# Patient Record
Sex: Female | Born: 1945
Health system: Southern US, Community
[De-identification: ages and names within clinical notes are randomized; demographics above are authoritative.]

## PROBLEM LIST (undated history)

## (undated) DIAGNOSIS — T4145XA Adverse effect of unspecified anesthetic, initial encounter: Secondary | ICD-10-CM

## (undated) DIAGNOSIS — J441 Chronic obstructive pulmonary disease with (acute) exacerbation: Secondary | ICD-10-CM

## (undated) DIAGNOSIS — H9319 Tinnitus, unspecified ear: Secondary | ICD-10-CM

## (undated) DIAGNOSIS — M949 Disorder of cartilage, unspecified: Secondary | ICD-10-CM

## (undated) DIAGNOSIS — J309 Allergic rhinitis, unspecified: Secondary | ICD-10-CM

## (undated) DIAGNOSIS — M899 Disorder of bone, unspecified: Secondary | ICD-10-CM

## (undated) DIAGNOSIS — N959 Unspecified menopausal and perimenopausal disorder: Secondary | ICD-10-CM

## (undated) DIAGNOSIS — J45909 Unspecified asthma, uncomplicated: Secondary | ICD-10-CM

## (undated) DIAGNOSIS — M549 Dorsalgia, unspecified: Secondary | ICD-10-CM

## (undated) DIAGNOSIS — M810 Age-related osteoporosis without current pathological fracture: Secondary | ICD-10-CM

## (undated) DIAGNOSIS — R062 Wheezing: Secondary | ICD-10-CM

## (undated) DIAGNOSIS — F411 Generalized anxiety disorder: Secondary | ICD-10-CM

## (undated) DIAGNOSIS — J449 Chronic obstructive pulmonary disease, unspecified: Secondary | ICD-10-CM

## (undated) DIAGNOSIS — M1990 Unspecified osteoarthritis, unspecified site: Secondary | ICD-10-CM

## (undated) DIAGNOSIS — M75100 Unspecified rotator cuff tear or rupture of unspecified shoulder, not specified as traumatic: Secondary | ICD-10-CM

## (undated) DIAGNOSIS — J069 Acute upper respiratory infection, unspecified: Secondary | ICD-10-CM

## (undated) DIAGNOSIS — H40219 Acute angle-closure glaucoma, unspecified eye: Secondary | ICD-10-CM

## (undated) DIAGNOSIS — R490 Dysphonia: Secondary | ICD-10-CM

## (undated) DIAGNOSIS — E785 Hyperlipidemia, unspecified: Secondary | ICD-10-CM

## (undated) DIAGNOSIS — R259 Unspecified abnormal involuntary movements: Secondary | ICD-10-CM

## (undated) DIAGNOSIS — R251 Tremor, unspecified: Secondary | ICD-10-CM

## (undated) DIAGNOSIS — F329 Major depressive disorder, single episode, unspecified: Secondary | ICD-10-CM

## (undated) DIAGNOSIS — T8859XA Other complications of anesthesia, initial encounter: Secondary | ICD-10-CM

## (undated) HISTORY — DX: Hyperlipidemia, unspecified: E78.5

## (undated) HISTORY — DX: Disorder of bone, unspecified: M89.9

## (undated) HISTORY — DX: Acute upper respiratory infection, unspecified: J06.9

## (undated) HISTORY — DX: Major depressive disorder, single episode, unspecified: F32.9

## (undated) HISTORY — DX: Unspecified rotator cuff tear or rupture of unspecified shoulder, not specified as traumatic: M75.100

## (undated) HISTORY — DX: Dysphonia: R49.0

## (undated) HISTORY — DX: Unspecified asthma, uncomplicated: J45.909

## (undated) HISTORY — DX: Unspecified abnormal involuntary movements: R25.9

## (undated) HISTORY — DX: Allergic rhinitis, unspecified: J30.9

## (undated) HISTORY — DX: Wheezing: R06.2

## (undated) HISTORY — DX: Disorder of cartilage, unspecified: M94.9

## (undated) HISTORY — DX: Dorsalgia, unspecified: M54.9

## (undated) HISTORY — DX: Tremor, unspecified: R25.1

## (undated) HISTORY — PX: ABDOMINAL HYSTERECTOMY: SHX81

## (undated) HISTORY — DX: Chronic obstructive pulmonary disease, unspecified: J44.9

## (undated) HISTORY — DX: Tinnitus, unspecified ear: H93.19

## (undated) HISTORY — DX: Generalized anxiety disorder: F41.1

## (undated) HISTORY — DX: Unspecified menopausal and perimenopausal disorder: N95.9

## (undated) HISTORY — DX: Acute angle-closure glaucoma, unspecified eye: H40.219

## (undated) HISTORY — PX: OTHER SURGICAL HISTORY: SHX169

## (undated) HISTORY — DX: Chronic obstructive pulmonary disease with (acute) exacerbation: J44.1

## (undated) HISTORY — DX: Age-related osteoporosis without current pathological fracture: M81.0

---

## 1999-07-29 HISTORY — PX: OTHER SURGICAL HISTORY: SHX169

## 1999-12-02 ENCOUNTER — Ambulatory Visit (HOSPITAL_COMMUNITY): Admission: RE | Admit: 1999-12-02 | Discharge: 1999-12-02 | Payer: Self-pay | Admitting: Cardiovascular Disease

## 1999-12-17 ENCOUNTER — Ambulatory Visit (HOSPITAL_COMMUNITY): Admission: RE | Admit: 1999-12-17 | Discharge: 1999-12-17 | Payer: Self-pay | Admitting: Cardiovascular Disease

## 2009-06-27 ENCOUNTER — Ambulatory Visit: Payer: Self-pay | Admitting: Internal Medicine

## 2009-06-27 DIAGNOSIS — J4489 Other specified chronic obstructive pulmonary disease: Secondary | ICD-10-CM

## 2009-06-27 DIAGNOSIS — R062 Wheezing: Secondary | ICD-10-CM

## 2009-06-27 DIAGNOSIS — J449 Chronic obstructive pulmonary disease, unspecified: Secondary | ICD-10-CM

## 2009-06-27 HISTORY — DX: Wheezing: R06.2

## 2009-06-27 HISTORY — DX: Other specified chronic obstructive pulmonary disease: J44.89

## 2009-06-27 HISTORY — DX: Chronic obstructive pulmonary disease, unspecified: J44.9

## 2009-07-03 ENCOUNTER — Telehealth: Payer: Self-pay | Admitting: Internal Medicine

## 2009-07-30 ENCOUNTER — Ambulatory Visit: Payer: Self-pay | Admitting: Internal Medicine

## 2009-07-31 LAB — CONVERTED CEMR LAB
Albumin: 3.6 g/dL (ref 3.5–5.2)
Bilirubin Urine: NEGATIVE
Calcium: 9.1 mg/dL (ref 8.4–10.5)
Chloride: 104 meq/L (ref 96–112)
Creatinine, Ser: 0.5 mg/dL (ref 0.4–1.2)
Eosinophils Relative: 7.5 % — ABNORMAL HIGH (ref 0.0–5.0)
GFR calc non Af Amer: 131.64 mL/min (ref >60)
Hemoglobin, Urine: NEGATIVE
Hemoglobin: 13.9 g/dL (ref 12.0–15.0)
Ketones, ur: NEGATIVE mg/dL
Lymphocytes Relative: 30 % (ref 12.0–46.0)
Lymphs Abs: 1.4 10*3/uL (ref 0.7–4.0)
Monocytes Relative: 7.3 % (ref 3.0–12.0)
Neutro Abs: 2.6 10*3/uL (ref 1.4–7.7)
Nitrite: NEGATIVE
Potassium: 4.1 meq/L (ref 3.5–5.1)
RBC: 4.17 M/uL (ref 3.87–5.11)
Specific Gravity, Urine: 1.02 (ref 1.000–1.030)
Total CHOL/HDL Ratio: 3
Total Protein: 6.7 g/dL (ref 6.0–8.3)
Urine Glucose: NEGATIVE mg/dL
VLDL: 17 mg/dL (ref 0.0–40.0)
WBC: 4.7 10*3/uL (ref 4.5–10.5)

## 2009-08-01 ENCOUNTER — Telehealth: Payer: Self-pay | Admitting: Internal Medicine

## 2009-08-30 ENCOUNTER — Telehealth: Payer: Self-pay | Admitting: Internal Medicine

## 2009-08-30 ENCOUNTER — Ambulatory Visit: Payer: Self-pay | Admitting: Internal Medicine

## 2009-08-30 LAB — CONVERTED CEMR LAB
ALT: 28 units/L (ref 0–35)
Bilirubin, Direct: 0.2 mg/dL (ref 0.0–0.3)
CO2: 32 meq/L (ref 19–32)
Calcium: 9.4 mg/dL (ref 8.4–10.5)
Chloride: 103 meq/L (ref 96–112)
Cholesterol: 174 mg/dL (ref 0–200)
Glucose, Bld: 84 mg/dL (ref 70–99)
HDL: 81.3 mg/dL (ref >39.00)
LDL Cholesterol: 83 mg/dL (ref 0–99)
Magnesium: 2 mg/dL (ref 1.5–2.5)
Sodium: 143 meq/L (ref 135–145)
Total Protein: 6.6 g/dL (ref 6.0–8.3)
VLDL: 9.4 mg/dL (ref 0.0–40.0)

## 2009-09-04 ENCOUNTER — Ambulatory Visit: Payer: Self-pay | Admitting: Internal Medicine

## 2009-09-19 ENCOUNTER — Ambulatory Visit: Payer: Self-pay | Admitting: Internal Medicine

## 2009-09-19 DIAGNOSIS — J309 Allergic rhinitis, unspecified: Secondary | ICD-10-CM | POA: Insufficient documentation

## 2009-09-19 DIAGNOSIS — F329 Major depressive disorder, single episode, unspecified: Secondary | ICD-10-CM

## 2009-09-19 DIAGNOSIS — E785 Hyperlipidemia, unspecified: Secondary | ICD-10-CM | POA: Insufficient documentation

## 2009-09-19 DIAGNOSIS — F3289 Other specified depressive episodes: Secondary | ICD-10-CM

## 2009-09-19 DIAGNOSIS — F32A Depression, unspecified: Secondary | ICD-10-CM | POA: Insufficient documentation

## 2009-09-19 DIAGNOSIS — J45909 Unspecified asthma, uncomplicated: Secondary | ICD-10-CM

## 2009-09-19 HISTORY — DX: Other specified depressive episodes: F32.89

## 2009-09-19 HISTORY — DX: Unspecified asthma, uncomplicated: J45.909

## 2009-09-19 HISTORY — DX: Hyperlipidemia, unspecified: E78.5

## 2009-09-19 HISTORY — DX: Allergic rhinitis, unspecified: J30.9

## 2009-09-19 HISTORY — DX: Major depressive disorder, single episode, unspecified: F32.9

## 2009-09-25 ENCOUNTER — Telehealth: Payer: Self-pay | Admitting: Internal Medicine

## 2009-10-15 ENCOUNTER — Ambulatory Visit: Payer: Self-pay | Admitting: Internal Medicine

## 2009-10-15 LAB — CONVERTED CEMR LAB
AST: 32 units/L (ref 0–37)
Albumin: 4.1 g/dL (ref 3.5–5.2)
Alkaline Phosphatase: 63 units/L (ref 39–117)
Cholesterol: 222 mg/dL — ABNORMAL HIGH (ref 0–200)
Total Bilirubin: 0.7 mg/dL (ref 0.3–1.2)
Total CHOL/HDL Ratio: 2
Total Protein: 6.9 g/dL (ref 6.0–8.3)
VLDL: 9.4 mg/dL (ref 0.0–40.0)

## 2009-10-17 ENCOUNTER — Ambulatory Visit: Payer: Self-pay | Admitting: Internal Medicine

## 2009-10-17 DIAGNOSIS — R490 Dysphonia: Secondary | ICD-10-CM

## 2009-10-17 HISTORY — DX: Dysphonia: R49.0

## 2009-10-25 ENCOUNTER — Encounter: Payer: Self-pay | Admitting: Internal Medicine

## 2009-12-19 ENCOUNTER — Ambulatory Visit: Payer: Self-pay | Admitting: Internal Medicine

## 2009-12-19 DIAGNOSIS — J441 Chronic obstructive pulmonary disease with (acute) exacerbation: Secondary | ICD-10-CM

## 2009-12-19 HISTORY — DX: Chronic obstructive pulmonary disease with (acute) exacerbation: J44.1

## 2009-12-25 ENCOUNTER — Telehealth: Payer: Self-pay | Admitting: Internal Medicine

## 2010-02-14 ENCOUNTER — Ambulatory Visit: Payer: Self-pay | Admitting: Internal Medicine

## 2010-02-14 DIAGNOSIS — M549 Dorsalgia, unspecified: Secondary | ICD-10-CM | POA: Insufficient documentation

## 2010-02-14 DIAGNOSIS — H40219 Acute angle-closure glaucoma, unspecified eye: Secondary | ICD-10-CM

## 2010-02-14 DIAGNOSIS — R259 Unspecified abnormal involuntary movements: Secondary | ICD-10-CM

## 2010-02-14 HISTORY — DX: Dorsalgia, unspecified: M54.9

## 2010-02-14 HISTORY — DX: Acute angle-closure glaucoma, unspecified eye: H40.219

## 2010-02-14 HISTORY — DX: Unspecified abnormal involuntary movements: R25.9

## 2010-03-19 ENCOUNTER — Telehealth: Payer: Self-pay | Admitting: Internal Medicine

## 2010-03-27 ENCOUNTER — Telehealth: Payer: Self-pay | Admitting: Internal Medicine

## 2010-04-15 ENCOUNTER — Ambulatory Visit: Payer: Self-pay | Admitting: Internal Medicine

## 2010-04-15 DIAGNOSIS — F419 Anxiety disorder, unspecified: Secondary | ICD-10-CM

## 2010-04-15 DIAGNOSIS — F329 Major depressive disorder, single episode, unspecified: Secondary | ICD-10-CM | POA: Insufficient documentation

## 2010-04-15 DIAGNOSIS — F411 Generalized anxiety disorder: Secondary | ICD-10-CM

## 2010-04-15 HISTORY — DX: Generalized anxiety disorder: F41.1

## 2010-06-12 ENCOUNTER — Encounter: Admission: RE | Admit: 2010-06-12 | Discharge: 2010-06-12 | Payer: Self-pay | Admitting: Internal Medicine

## 2010-06-24 ENCOUNTER — Encounter: Payer: Self-pay | Admitting: Internal Medicine

## 2010-06-27 ENCOUNTER — Encounter: Admission: RE | Admit: 2010-06-27 | Discharge: 2010-06-27 | Payer: Self-pay | Admitting: Internal Medicine

## 2010-08-14 ENCOUNTER — Ambulatory Visit: Admit: 2010-08-14 | Payer: Self-pay | Admitting: Internal Medicine

## 2010-08-15 ENCOUNTER — Other Ambulatory Visit: Payer: Self-pay | Admitting: Internal Medicine

## 2010-08-15 ENCOUNTER — Ambulatory Visit
Admission: RE | Admit: 2010-08-15 | Discharge: 2010-08-15 | Payer: Self-pay | Source: Home / Self Care | Attending: Internal Medicine | Admitting: Internal Medicine

## 2010-08-15 LAB — URINALYSIS, ROUTINE W REFLEX MICROSCOPIC
Bilirubin Urine: NEGATIVE
Hemoglobin, Urine: NEGATIVE
Ketones, ur: NEGATIVE
Nitrite: NEGATIVE
Specific Gravity, Urine: 1.015 (ref 1.000–1.030)
Total Protein, Urine: NEGATIVE
Urine Glucose: NEGATIVE
Urobilinogen, UA: 0.2 (ref 0.0–1.0)
pH: 7 (ref 5.0–8.0)

## 2010-08-15 LAB — BASIC METABOLIC PANEL
BUN: 17 mg/dL (ref 6–23)
CO2: 32 mEq/L (ref 19–32)
Calcium: 9.5 mg/dL (ref 8.4–10.5)
Chloride: 99 mEq/L (ref 96–112)
Creatinine, Ser: 0.6 mg/dL (ref 0.4–1.2)
GFR: 108.4 mL/min (ref >60.00)
Glucose, Bld: 90 mg/dL (ref 70–99)
Potassium: 4.1 mEq/L (ref 3.5–5.1)
Sodium: 138 mEq/L (ref 135–145)

## 2010-08-15 LAB — LIPID PANEL
Cholesterol: 283 mg/dL — ABNORMAL HIGH (ref 0–200)
HDL: 78.5 mg/dL (ref >39.00)
Total CHOL/HDL Ratio: 4
Triglycerides: 60 mg/dL (ref 0.0–149.0)
VLDL: 12 mg/dL (ref 0.0–40.0)

## 2010-08-15 LAB — CBC WITH DIFFERENTIAL/PLATELET
Basophils Absolute: 0 10*3/uL (ref 0.0–0.1)
Basophils Relative: 0.6 % (ref 0.0–3.0)
Eosinophils Absolute: 0.1 10*3/uL (ref 0.0–0.7)
Eosinophils Relative: 1.9 % (ref 0.0–5.0)
HCT: 43.4 % (ref 36.0–46.0)
Hemoglobin: 15.1 g/dL — ABNORMAL HIGH (ref 12.0–15.0)
Lymphocytes Relative: 20.9 % (ref 12.0–46.0)
Lymphs Abs: 1.2 10*3/uL (ref 0.7–4.0)
MCHC: 34.8 g/dL (ref 30.0–36.0)
MCV: 98.3 fl (ref 78.0–100.0)
Monocytes Absolute: 0.4 10*3/uL (ref 0.1–1.0)
Monocytes Relative: 7.6 % (ref 3.0–12.0)
Neutro Abs: 3.8 10*3/uL (ref 1.4–7.7)
Neutrophils Relative %: 69 % (ref 43.0–77.0)
Platelets: 232 10*3/uL (ref 150.0–400.0)
RBC: 4.41 Mil/uL (ref 3.87–5.11)
RDW: 12.6 % (ref 11.5–14.6)
WBC: 5.5 10*3/uL (ref 4.5–10.5)

## 2010-08-15 LAB — HEPATIC FUNCTION PANEL
ALT: 19 U/L (ref 0–35)
AST: 20 U/L (ref 0–37)
Albumin: 3.7 g/dL (ref 3.5–5.2)
Alkaline Phosphatase: 64 U/L (ref 39–117)
Bilirubin, Direct: 0.1 mg/dL (ref 0.0–0.3)
Total Bilirubin: 0.7 mg/dL (ref 0.3–1.2)
Total Protein: 6.4 g/dL (ref 6.0–8.3)

## 2010-08-15 LAB — TSH: TSH: 0.55 u[IU]/mL (ref 0.35–5.50)

## 2010-08-15 LAB — LDL CHOLESTEROL, DIRECT: Direct LDL: 196 mg/dL

## 2010-08-20 ENCOUNTER — Ambulatory Visit
Admission: RE | Admit: 2010-08-20 | Discharge: 2010-08-20 | Payer: Self-pay | Source: Home / Self Care | Attending: Internal Medicine | Admitting: Internal Medicine

## 2010-08-20 DIAGNOSIS — N959 Unspecified menopausal and perimenopausal disorder: Secondary | ICD-10-CM | POA: Insufficient documentation

## 2010-08-20 HISTORY — DX: Unspecified menopausal and perimenopausal disorder: N95.9

## 2010-08-22 ENCOUNTER — Ambulatory Visit
Admission: RE | Admit: 2010-08-22 | Discharge: 2010-08-22 | Payer: Self-pay | Source: Home / Self Care | Attending: Internal Medicine | Admitting: Internal Medicine

## 2010-08-22 ENCOUNTER — Encounter: Payer: Self-pay | Admitting: Internal Medicine

## 2010-08-27 NOTE — Assessment & Plan Note (Signed)
Summary: BACK PAIN---STC   Vital Signs:  Patient profile:   65 year old female Height:      63 inches Weight:      116.75 pounds BMI:     20.76 O2 Sat:      94 % on Room air Temp:     98.1 degrees F oral Pulse rate:   72 / minute BP sitting:   110 / 62  (left arm) Cuff size:   regular  Vitals Entered By: Zella Ball Ewing CMA Duncan Dull) (February 14, 2010 9:18 AM)  O2 Flow:  Room air CC: Back Injury 2 weeks ago, still painful/RE   CC:  Back Injury 2 weeks ago and still painful/RE.  History of Present Illness: here with acute onset right lower back pain, acuteonset mild to mod for 2 wks persistent and constant, started after she twisted at the waist trying to work on a storm door at the house;  no LE pain, weak or numbness,  bowel or bladder change, gait change , fall or other injury.  No fever, wt loss, night sweats, loss of appetite or other constitutional symptoms  TWorse to get up from chair or bend, and no improved with OTC tylenol and motrin.    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 doing well now withou no vision loss;    also with onset worsening tremor to both hands grad worse in the lsat few months, that affects her ability to eat or write  Pt denies CP, sob, doe, wheezing, orthopnea, pnd, worsening LE edema, palps, dizziness or syncope  Pt denies other new neuro symptoms such as headache, facial or extremity weakness   Problems Prior to Update: 1)  Chronic Obstructive Pulmonary Disease, Acute Exacerbation  (ICD-491.21) 2)  Hoarseness  (ICD-784.42) 3)  Depression  (ICD-311) 4)  Asthma  (ICD-493.90) 5)  Allergic Rhinitis  (ICD-477.9) 6)  Hyperlipidemia  (ICD-272.4) 7)  Preventive Health Care  (ICD-V70.0) 8)  Wheezing  (ICD-786.07) 9)  COPD  (ICD-496)  Medications Prior to Update: 1)  Proair Hfa 108 (90 Base) Mcg/act Aers (Albuterol Sulfate) .... 2 Puffs Four Times Per Day As Needed For Shortness of  Breath 2)  Symbicort 160-4.5 Mcg/act Aero (Budesonide-Formoterol Fumarate) .... 2 Puffs Two Times A Day 3)  Aspir-Low 81 Mg Tbec (Aspirin) .[name].. 1po Once Daily 4)  Pravastatin Sodium 40 Mg Tabs (Pravastatin Sodium) .[name].. 1 By Mouth Once Daily 5)  Bupropion Hcl 150 Mg Xr24h-Tab (Bupropion Hcl) .[name].. 1po Once Daily 6)  Fexofenadine Hcl 180 Mg Tabs (Fexofenadine Hcl) .[name].. 1po Once Daily 7)  Omeprazole 20 Mg Cpdr (Omeprazole) .[name].. 1 By Mouth Once Daily 8)  Prednisone (Pak) 10 Mg Tabs (Prednisone) .... 3po Qd For 3days, Then 2po Qd For 3days, Then 1po Qd For 3days, Then Stop  Current Medications (verified): 1)  Proair Hfa 108 (90 Base) Mcg/act Aers (Albuterol Sulfate) .... 2 Puffs Four Times Per Day As Needed For Shortness of Breath 2)  Symbicort 160-4.5 Mcg/act Aero (Budesonide-Formoterol Fumarate) .... 2 Puffs Two Times A Day 3)  Aspir-Low 81 Mg Tbec (Aspirin) .[name].. 1po Once Daily 4)  Pravastatin Sodium 40 Mg Tabs (Pravastatin Sodium) .[name].. 1 By Mouth Once Daily 5)  Bupropion Hcl 150 Mg Xr24h-Tab (Bupropion Hcl) .[name].. 1po Once Daily 6)  Fexofenadine Hcl 180 Mg Tabs (Fexofenadine Hcl) .[name].. 1po Once Daily 7)  Omeprazole 20 Mg Cpdr (Omeprazole) .[name].. 1 By Mouth Once Daily 8)  Prednisone (Pak) 10 Mg Tabs (Prednisone) .... 3po Qd For 3days, Then 2po Qd For 3days, Then 1po Qd For 3days, Then Stop 9)  Hydrocodone-Acetaminophen 7.5-325 Mg Tabs (Hydrocodone-Acetaminophen) .[name].. 1 By Mouth Q 6 Hrs As Needed Pain 10)  Cyclobenzaprine Hcl 5 Mg Tabs (Cyclobenzaprine Hcl) .[name].. 1po Three Times A Day As Needed Pain 11)  Metoprolol Succinate 25 Mg Xr24h-Tab (Metoprolol Succinate) .[name].. 1 By Mouth Once Daily  Allergies (verified): 1)  ! 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  , bright, alert, smiling Head:  normocephalic and atraumatic.   Eyes:  vision grossly intact, pupils equal, and pupils round.   Ears:  R ear normal and L ear normal.   Nose:  no external deformity and no nasal discharge.   Mouth:  no gingival abnormalities and pharynx pink and moist.   Neck:  supple and no masses.   Lungs:  normal respiratory effort, R decreased breath sounds, and L decreased breath sounds.   Heart:  normal rate and regular rhythm.   Msk:  no joint tenderness and no joint swelling. , no spine tender or paravertebral tender  Extremities:  no edema, no erythema  Neurologic:  strength normal in all extremities, sensation intact to light touch, gait normal, and DTRs symmetrical and normal.  , tremor noted bilat - mild   Impression & Recommendations:  Problem # 1:  BACK PAIN (ICD-724.5)  Her updated medication list for this problem includes:    Aspir-low 81 Mg Tbec (Aspirin) .[name]... 1po once daily    Hydrocodone-acetaminophen 7.5-325 Mg Tabs (Hydrocodone-acetaminophen) .[name]... 1 by mouth q 6 hrs as needed pain    Cyclobenzaprine Hcl 5 Mg Tabs (Cyclobenzaprine hcl) .[name]... 1po three times a day as needed pain right lower back, c/w prob exac of underlying DJD/DDD or other msk strain - ok for pain med, flexeril as needed, and pred pack trial  Problem # 2:  TREMOR (ICD-781.0) for low dose beta blocker, watch for any side effect such as dizziness, or worsening sob  Problem # 3:  COPD (ICD-496)  Her updated medication list for this problem includes:    Proair Hfa 108 (90 Base) Mcg/act Aers (Albuterol sulfate) .[name]... 2  puffs four times per day as needed for shortness of breath    Symbicort 160-4.5 Mcg/act Aero (Budesonide-formoterol fumarate) .[name]... 2 puffs two times a day stable overall by hx and exam, ok to continue meds/tx as is ; for PFT's next visit  Complete Medication List: 1)  Proair Hfa 108 (90 Base) Mcg/act Aers (Albuterol sulfate) .... 2 puffs four times per day as needed for shortness of breath 2)  Symbicort 160-4.5 Mcg/act Aero (Budesonide-formoterol fumarate) .... 2 puffs two times a day 3)  Aspir-low 81 Mg Tbec (Aspirin) .[name].. 1po once daily 4)  Pravastatin Sodium 40 Mg Tabs (Pravastatin sodium) .[name].. 1 by mouth once daily 5)  Bupropion Hcl 150 Mg Xr24h-tab (Bupropion hcl) .[name].. 1po  once daily 6)  Fexofenadine Hcl 180 Mg Tabs (Fexofenadine hcl) .[name].. 1po once daily 7)  Omeprazole 20 Mg Cpdr (Omeprazole) .[name].. 1 by mouth once daily 8)  Prednisone (pak) 10 Mg Tabs (Prednisone) .... 3po qd for 3days, then 2po qd for 3days, then 1po qd for 3days, then stop 9)  Hydrocodone-acetaminophen 7.5-325 Mg Tabs (Hydrocodone-acetaminophen) .[name].. 1 by mouth q 6 hrs as needed pain 10)  Cyclobenzaprine Hcl 5 Mg Tabs (Cyclobenzaprine hcl) .[name].. 1po three times a day as needed pain 11)  Metoprolol Succinate 25 Mg Xr24h-tab (Metoprolol succinate) .[name].. 1 by mouth once daily  Patient Instructions: 1)  Please take all new medications as prescribed 2)  Continue all previous medications as before this visit  3)  Please schedule a follow-up appointment in 6 months  - Jan 2012 with CPX labs and: 4)  PFT's - 492.0 Prescriptions: METOPROLOL SUCCINATE 25 MG XR24H-TAB (METOPROLOL SUCCINATE) 1 by mouth once daily  #90 x 3   Entered and Authorized by:   Corwin Levins MD   Signed by:   Corwin Levins MD on 02/14/2010   Method used:   Print then Give to Patient   RxID:   [id number] CYCLOBENZAPRINE HCL 5 MG TABS (CYCLOBENZAPRINE HCL) 1po three times a day as needed pain  #60 x 1   Entered and Authorized by:   Corwin Levins MD    Signed by:   Corwin Levins MD on 02/14/2010   Method used:   Print then Give to Patient   RxID:   [id number] HYDROCODONE-ACETAMINOPHEN 7.5-325 MG TABS (HYDROCODONE-ACETAMINOPHEN) 1 by mouth q 6 hrs as needed pain  #60 x 1   Entered and Authorized by:   Corwin Levins MD   Signed by:   Corwin Levins MD on 02/14/2010   Method used:   Print then Give to Patient   RxID:   [id number] PREDNISONE (PAK) 10 MG TABS (PREDNISONE) 3po qd for 3days, then 2po qd for 3days, then 1po qd for 3days, then stop  #18 x 0   Entered and Authorized by:   Corwin Levins MD   Signed by:   Corwin Levins MD on 02/14/2010   Method used:   Print then Give to Patient   RxID:   [id number]

## 2010-08-27 NOTE — Assessment & Plan Note (Signed)
Summary: CONGESTION/NWS   Vital Signs:  Patient profile:   65 year old female Height:      62.5 inches Weight:      116.25 pounds BMI:     21.00 O2 Sat:      95 % on Room air Temp:     98.6 degrees F oral Pulse rate:   68 / minute BP sitting:   112 / 70  (left arm) Cuff size:   regular  Vitals Entered By: Zella Ball Ewing CMA Duncan Dull) (April 15, 2010 9:24 AM)  O2 Flow:  Room air CC: Chest Congestion/RE   CC:  Chest Congestion/RE.  History of Present Illness: here for acute - c/o 3-4 days onset fever, mild ST, head congesiton, and now worsening increasing prod cough greenish sputum, with also onset mild wheezing with mild sob/doe since yesterday;  no chill, and Pt denies CP,  orthopnea, pnd, worsening LE edema, palps, dizziness or syncope  Has not caused decrease in ambulation.  No  wt loss, night sweats,  or other constitutional symptoms except for mild loss of appetitie.  Pt denies new neuro symptoms such as headache, facial or extremity weakness  Here "early" in her illness pt states , as this is similar to previous when she waited too long.  Denies worsening depressive symptoms, suicidal ideation or panic.  Does have ongong social stressors which seem to flare with circumstances, and even though she has quit smoking for up to a yr in the past, just cant seem to be able to try to quit now.    Problems Prior to Update: 1)  Anxiety  (ICD-300.00) 2)  Bronchitis-acute  (ICD-466.0) 3)  Back Pain  (ICD-724.5) 4)  Tremor  (ICD-781.0) 5)  Acute Angle-closure Glaucoma  (ICD-365.22) 6)  Chronic Obstructive Pulmonary Disease, Acute Exacerbation  (ICD-491.21) 7)  Hoarseness  (ICD-784.42) 8)  Depression  (ICD-311) 9)  Asthma  (ICD-493.90) 10)  Allergic Rhinitis  (ICD-477.9) 11)  Hyperlipidemia  (ICD-272.4) 12)  Preventive Health Care  (ICD-V70.0) 13)  Wheezing  (ICD-786.07) 14)  COPD  (ICD-496)  Medications Prior to Update: 1)  Proair Hfa 108 (90 Base) Mcg/act Aers (Albuterol Sulfate)  .... 2 Puffs Four Times Per Day As Needed For Shortness of Breath 2)  Symbicort 160-4.5 Mcg/act Aero (Budesonide-Formoterol Fumarate) .... 2 Puffs Two Times A Day 3)  Aspir-Low 81 Mg Tbec (Aspirin) .[name].. 1po Once Daily 4)  Pravastatin Sodium 40 Mg Tabs (Pravastatin Sodium) .[name].. 1 By Mouth Once Daily 5)  Bupropion Hcl 150 Mg Xr24h-Tab (Bupropion Hcl) .[name].. 1po Once Daily 6)  Fexofenadine Hcl 180 Mg Tabs (Fexofenadine Hcl) .[name].. 1po Once Daily 7)  Omeprazole 20 Mg Cpdr (Omeprazole) .[name].. 1 By Mouth Once Daily 8)  Prednisone (Pak) 10 Mg Tabs (Prednisone) .... 3po Qd For 3days, Then 2po Qd For 3days, Then 1po Qd For 3days, Then Stop 9)  Hydrocodone-Acetaminophen 7.5-325 Mg Tabs (Hydrocodone-Acetaminophen) .[name].. 1 By Mouth Q 6 Hrs As Needed Pain 10)  Cyclobenzaprine Hcl 5 Mg Tabs (Cyclobenzaprine Hcl) .[name].. 1po Three Times A Day As Needed Pain 11)  Metoprolol Succinate 25 Mg Xr24h-Tab (Metoprolol Succinate) .[name].. 1 By Mouth Once Daily  Current Medications (verified): 1)  Proair Hfa 108 (90 Base) Mcg/act Aers (Albuterol Sulfate) .... 2 Puffs Four Times Per Day As Needed For Shortness of Breath 2)  Symbicort 160-4.5 Mcg/act Aero (Budesonide-Formoterol Fumarate) .... 2 Puffs Two Times A Day 3)  Aspir-Low 81 Mg Tbec (Aspirin) .[name].. 1po Once Daily 4)  Pravastatin Sodium 40 Mg Tabs (  Pravastatin Sodium) .[name].. 1 By Mouth Once Daily 5)  Bupropion Hcl 150 Mg Xr24h-Tab (Bupropion Hcl) .[name].. 1po Once Daily 6)  Fexofenadine Hcl 180 Mg Tabs (Fexofenadine Hcl) .[name].. 1po Once Daily 7)  Omeprazole 20 Mg Cpdr (Omeprazole) .[name].. 1 By Mouth Once Daily 8)  Prednisone (Pak) 10 Mg Tabs (Prednisone) .... 3po Qd For 3days, Then 2po Qd For 3days, Then 1po Qd For 3days, Then Stop 9)  Hydrocodone-Acetaminophen 7.5-325 Mg Tabs (Hydrocodone-Acetaminophen) .[name].. 1 By Mouth Q 6 Hrs As Needed Pain 10)  Cyclobenzaprine Hcl 5 Mg Tabs (Cyclobenzaprine Hcl) .[name].. 1po Three Times A Day As Needed Pain 11)  Metoprolol Succinate 25 Mg Xr24h-Tab  (Metoprolol Succinate) .[name].. 1 By Mouth Once Daily 12)  Azithromycin 250 Mg Tabs (Azithromycin) .... 2po Qd For 1 Day, Then 1po Qd For 4days, Then Stop 13)  Prednisone 10 Mg Tabs (Prednisone) .... 3po Qd For 3days, Then 2po Qd For 3days, Then 1po Qd For 3days, Then Stop  Allergies (verified): 1)  ! 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  , mild ill  Head:  normocephalic and atraumatic.   Eyes:  vision grossly intact, pupils equal, and pupils round.   Ears:  bilat tm's red, sinus nontender Nose:  nasal dischargemucosal pallor and mucosal edema.   Mouth:  pharyngeal erythema and fair dentition.   Neck:  supple and no masses.   Lungs:  normal respiratory effort, R decreased breath sounds, and L decreased breath sounds with vey mild wheeze bilat.   Heart:  normal rate and regular rhythm.   Extremities:  no edema, no erythema    Impression & Recommendations:  Problem # 1:  CHRONIC OBSTRUCTIVE PULMONARY DISEASE, ACUTE EXACERBATION (ICD-491.21)  milder than previous;  for depomedrol, pred pack, has cough med and inhalers at home already, Continue all previous medications as before this visit , f/u any worsening symptoms  Orders: Depo- Medrol 40mg  (J1030) Depo- Medrol 80mg  (J1040) Admin of Therapeutic Inj  intramuscular or subcutaneous (86578)  Problem # 2:   BRONCHITIS-ACUTE (ICD-466.0)  Her updated medication list for this problem includes:    Proair Hfa 108 (90 Base) Mcg/act Aers (Albuterol sulfate) .[name]... 2 puffs four times per day as needed for shortness of breath    Symbicort 160-4.5 Mcg/act Aero (Budesonide-formoterol fumarate) .[name]... 2 puffs two times a day    Azithromycin 250 Mg Tabs (Azithromycin) .[name]... 2po qd for 1 day, then 1po qd for 4days, then stop for antibx, treat as above, f/u any worsening signs or symptoms   Problem # 3:  ANXIETY (ICD-300.00)  Her updated medication list for this problem includes:    Bupropion Hcl 150 Mg Xr24h-tab (Bupropion hcl) .[name]... 1po once daily c/w anxiety, unable to quit smoking, declines further tx , smoking cess classes or SSRI or other tx, declines counseling as well  Complete Medication List: 1)  Proair Hfa 108 (90 Base) Mcg/act Aers (Albuterol  sulfate) .... 2 puffs four times per day as needed for shortness of breath 2)  Symbicort 160-4.5 Mcg/act Aero (Budesonide-formoterol fumarate) .... 2 puffs two times a day 3)  Aspir-low 81 Mg Tbec (Aspirin) .[name].. 1po once daily 4)  Pravastatin Sodium 40 Mg Tabs (Pravastatin sodium) .[name].. 1 by mouth once daily 5)  Bupropion Hcl 150 Mg Xr24h-tab (Bupropion hcl) .[name].. 1po once daily 6)  Fexofenadine Hcl 180 Mg Tabs (Fexofenadine hcl) .[name].. 1po once daily 7)  Omeprazole 20 Mg Cpdr (Omeprazole) .[name].. 1 by mouth once daily 8)  Prednisone (pak) 10 Mg Tabs (Prednisone) .... 3po qd for 3days, then 2po qd for 3days, then 1po qd for 3days, then stop 9)  Hydrocodone-acetaminophen 7.5-325 Mg Tabs (Hydrocodone-acetaminophen) .[name].. 1 by mouth q 6 hrs as needed pain 10)  Cyclobenzaprine Hcl 5 Mg Tabs (Cyclobenzaprine hcl) .[name].. 1po three times a day as needed pain 11)  Metoprolol Succinate 25 Mg Xr24h-tab (Metoprolol succinate) .[name].. 1 by mouth once daily 12)  Azithromycin 250 Mg Tabs (Azithromycin) .... 2po qd for 1 day, then 1po qd for 4days, then stop 13)  Prednisone 10 Mg Tabs  (Prednisone) .... 3po qd for 3days, then 2po qd for 3days, then 1po qd for 3days, then stop  Patient Instructions: 1)  Please take all new medications as prescribed  - the antibiotic, and prednisone (sent to the pharmacy) 2)  you had the steroid shot today 3)  Continue all previous medications as before this visit  4)  Please schedule a follow-up appointment in Jan 2012 with CPX labs Prescriptions: PREDNISONE 10 MG TABS (PREDNISONE) 3po qd for 3days, then 2po qd for 3days, then 1po qd for 3days, then stop  #18 x 0   Entered and Authorized by:   Corwin Levins MD   Signed by:   Corwin Levins MD on 04/15/2010   Method used:   Electronically to        [facility]* (retail)       [address] [place], [place]  [address]       Ph: [phone] or [phone]       Fax: [phone]   RxID:   [id number] AZITHROMYCIN 250 MG TABS (AZITHROMYCIN) 2po qd for 1 day, then 1po qd for 4days, then stop  #6 x 1   Entered and Authorized by:   Corwin Levins MD   Signed by:   Corwin Levins MD on 04/15/2010   Method used:   Electronically to        [facility]* (retail)       [address] [place], [place]  [address]       Ph: [phone] or [phone]       Fax: [phone]   RxID:   [id number]    Medication Administration  Injection # 1:    Medication: Depo- Medrol 40mg     Diagnosis: CHRONIC OBSTRUCTIVE PULMONARY DISEASE, ACUTE EXACERBATION (ICD-491.21)    Route: IM    Site: LUOQ gluteus    Exp Date: 10/2012    Lot #: [id number]    Mfr: [employer]    Comments: Patient received 120mg  Depo-medrol    Patient tolerated injection without complications    Given by: Zella Ball Ewing CMA (AAMA) (April 15, 2010 10:12 AM)  Injection # 2:    Medication: Depo- Medrol 80mg     Diagnosis: CHRONIC OBSTRUCTIVE PULMONARY DISEASE, ACUTE EXACERBATION (ICD-491.21)    Route: IM     Site: LUOQ gluteus    Exp Date: 10/2012    Lot #: [id number]    Mfr: [employer]    Given by: Zella Ball Ewing CMA (AAMA) (April 15, 2010 10:12 AM)  Orders Added: 1)  Depo- Medrol 40mg  [J1030] 2)  Depo- Medrol 80mg  [J1040] 3)  Admin of Therapeutic Inj  intramuscular or subcutaneous [96372] 4)  Est. Patient Level IV [04540]

## 2010-08-27 NOTE — Progress Notes (Signed)
Summary: Breathing problems?  Phone Note Call from Patient Call back at Home Phone [phone]   Caller: Patient Summary of Call: pt called stating that Symbicort inhaler is not working as well and she has been having to use her rescue inhaler at least 3 times a day. Pt says that at last OV she was told to call and inform MD if she was not better on Rx'd medications. Initial call taken by: Margaret Pyle, CMA,  Dec 25, 2009 3:19 PM  Follow-up for Phone Call        as long as it is only 3 times per day, should be ok to cont meds as is; needs to finish the prednisone as well Follow-up by: Corwin Levins MD,  Dec 25, 2009 4:02 PM  Additional Follow-up for Phone Call Additional follow up Details #1::        pt informed Additional Follow-up by: Margaret Pyle, CMA,  Dec 25, 2009 4:18 PM

## 2010-08-27 NOTE — Progress Notes (Signed)
Summary: rx request  Phone Note Call from Patient   Caller: Patient [phone] Summary of Call: pt called requesting medication for chol be sent to [facility]. Initial call taken by: Margaret Pyle, CMA,  August 01, 2009 2:43 PM    New/Updated Medications: SIMVASTATIN 40 MG TABS (SIMVASTATIN) 1 by mouth once daily Prescriptions: SIMVASTATIN 40 MG TABS (SIMVASTATIN) 1 by mouth once daily  #30 x 11   Entered by:   Margaret Pyle, CMA   Authorized by:   Corwin Levins MD   Signed by:   Margaret Pyle, CMA on 08/01/2009   Method used:   Electronically to        [facility]* (retail)       [address] [place], [place]  [address]       Ph: [phone] or [phone]       Fax: [phone]   RxID:   [id number]

## 2010-08-27 NOTE — Progress Notes (Signed)
Summary: Rx refill req  Phone Note Refill Request Message from:  Patient on March 27, 2010 10:48 AM  Refills Requested: Medication #1:  SYMBICORT 160-4.5 MCG/ACT AERO 2 puffs two times a day   Dosage confirmed as above?Dosage Confirmed   Supply Requested: 6 months  Method Requested: Fax to [facility] Initial call taken by: Margaret Pyle, CMA,  March 27, 2010 10:49 AM    Prescriptions: SYMBICORT 160-4.5 MCG/ACT AERO (BUDESONIDE-FORMOTEROL FUMARATE) 2 puffs two times a day  #3 x 1   Entered by:   Margaret Pyle, CMA   Authorized by:   Corwin Levins MD   Signed by:   Margaret Pyle, CMA on 03/27/2010   Method used:   Electronically to        [facility]* (mail-order)       [address], [place]  [address]       Ph: [phone]       Fax: [phone]   RxID:   [id number]

## 2010-08-27 NOTE — Assessment & Plan Note (Signed)
Summary: 1 mos f/u #/cd   Vital Signs:  Patient profile:   65 year old female Height:      62 inches Weight:      124 pounds BMI:     22.76 O2 Sat:      97 % on Room air Temp:     97.8 degrees F oral Pulse rate:   62 / minute BP sitting:   132 / 64  (left arm) Cuff size:   regular  Vitals Entered ByZella Ball Ewing (October 17, 2009 9:25 AM)  O2 Flow:  Room air CC: 1 month followup/RE   CC:  1 month followup/RE.  History of Present Illness: c/o 1-2 mo increased intermittent hoarseness, seemed to start about the time she started the symbicort, allegra doing well for the allergies;  no cough, no fever, no blood;  no pain, Pt denies CP, or worsening sob, doe, wheezing, orthopnea, pnd, worsening LE edema, palps, dizziness or syncope, and has been outdoors quite a bit in the past 2 wks for more excercise and has been moving the wet mulch around the yard without difficulty.  No obvious heartburn, has been drinking more water lately but does not help.  Takes mucinex daily as well.  Trying to follow lower chol diet.  Has had improved depressive symtpoms recently in the past mo with low mood, anhedonia, fatigue,  without suicidal ideation, increased anxiety or panic.  Overall good medicine tolerance and compliance.    Problems Prior to Update: 1)  Hoarseness  (ICD-784.42) 2)  Depression  (ICD-311) 3)  Asthma  (ICD-493.90) 4)  Allergic Rhinitis  (ICD-477.9) 5)  Hyperlipidemia  (ICD-272.4) 6)  Preventive Health Care  (ICD-V70.0) 7)  Wheezing  (ICD-786.07) 8)  COPD  (ICD-496)  Medications Prior to Update: 1)  Proair Hfa 108 (90 Base) Mcg/act Aers (Albuterol Sulfate) .... 2 Puffs Four Times Per Day As Needed For Shortness of Breath 2)  Symbicort 160-4.5 Mcg/act Aero (Budesonide-Formoterol Fumarate) .... 2 Puffs Two Times A Day 3)  Aspir-Low 81 Mg Tbec (Aspirin) .[name].. 1po Once Daily 4)  Pravastatin Sodium 40 Mg Tabs (Pravastatin Sodium) .[name].. 1 By Mouth Once Daily 5)  Bupropion Hcl 150 Mg  Xr24h-Tab (Bupropion Hcl) .[name].. 1po Once Daily 6)  Fexofenadine Hcl 180 Mg Tabs (Fexofenadine Hcl) .[name].. 1po Once Daily  Current Medications (verified): 1)  Proair Hfa 108 (90 Base) Mcg/act Aers (Albuterol Sulfate) .... 2 Puffs Four Times Per Day As Needed For Shortness of Breath 2)  Symbicort 160-4.5 Mcg/act Aero (Budesonide-Formoterol Fumarate) .... 2 Puffs Two Times A Day 3)  Aspir-Low 81 Mg Tbec (Aspirin) .[name].. 1po Once Daily 4)  Pravastatin Sodium 40 Mg Tabs (Pravastatin Sodium) .[name].. 1 By Mouth Once Daily 5)  Bupropion Hcl 150 Mg Xr24h-Tab (Bupropion Hcl) .[name].. 1po Once Daily 6)  Fexofenadine Hcl 180 Mg Tabs (Fexofenadine Hcl) .[name].. 1po Once Daily 7)  Omeprazole 20 Mg Cpdr (Omeprazole) .[name].. 1 By Mouth Once Daily  Allergies (verified): 1)  ! 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   Head:  normocephalic and atraumatic.   Eyes:  vision grossly intact, pupils equal, and pupils round.   Ears:  R ear normal and L ear normal.   Nose:  no external deformity and no external erythema.   Mouth:  no gingival abnormalities and pharynx pink and moist.   Neck:  supple and no masses.   Lungs:  normal respiratory effort, R decreased breath sounds, and L decreased breath sounds.  , but no frankl wheezing or rales Heart:  normal rate and regular rhythm.   Abdomen:  soft, non-tender, and normal bowel  sounds.   Extremities:  no edema, no erythema  Psych:  not anxious appearing and not depressed appearing.     Impression & Recommendations:  Problem # 1:  HYPERLIPIDEMIA (ICD-272.4)  Her updated medication list for this problem includes:    Pravastatin Sodium 40 Mg Tabs (Pravastatin sodium) .[name]... 1 by mouth once daily  Labs Reviewed: SGOT: 32 (10/15/2009)   SGPT: 33 (10/15/2009)   HDL:101.60 (10/15/2009), 81.30 (08/30/2009)  LDL:83 (08/30/2009)  Chol:222 (10/15/2009), 174 (08/30/2009)  Trig:47.0 (10/15/2009), 47.0 (08/30/2009) stable overall by hx and exam, ok to continue meds/tx as is, to follow a bit better lower chol diet as well   Problem # 2:  ASTHMA (ICD-493.90)  Her updated medication list for this problem includes:    Proair Hfa 108 (90 Base) Mcg/act Aers (Albuterol sulfate) .[name]... 2 puffs four times per day as needed for shortness of breath    Symbicort 160-4.5 Mcg/act Aero (Budesonide-formoterol fumarate) .[name]... 2 puffs two times a day stable overall by hx and exam, ok to continue meds/tx as is   Problem # 3:  HOARSENESS (UJW-119.14) unclear etiology -  for cxr , ppi, and refer ENT  Orders: T-2 View CXR, Same Day (71020.5TC) ENT Referral (ENT)  Problem # 4:  DEPRESSION (ICD-311)  Her updated medication list for this problem includes:    Bupropion Hcl 150 Mg Xr24h-tab (Bupropion hcl) .[name]... 1po once daily improved nicely, to cont meds as is  Complete Medication List: 1)  Proair Hfa 108 (90 Base) Mcg/act Aers (Albuterol sulfate) .... 2 puffs four times per day as needed for shortness of breath 2)  Symbicort 160-4.5 Mcg/act Aero (Budesonide-formoterol fumarate) .... 2 puffs two times a day 3)  Aspir-low 81 Mg Tbec (Aspirin) .[name].. 1po once daily 4)  Pravastatin Sodium 40 Mg Tabs (Pravastatin sodium) .[name].. 1 by mouth once daily 5)  Bupropion Hcl 150 Mg Xr24h-tab (Bupropion hcl) .[name].. 1po once daily 6)  Fexofenadine Hcl 180 Mg Tabs (Fexofenadine hcl) .[name].. 1po once daily 7)   Omeprazole 20 Mg Cpdr (Omeprazole) .[name].. 1 by mouth once daily  Patient Instructions: 1)  Please take all new medications as prescribed - the generic prilosec 2)  Continue all other previous medications as before this visit 3)  Please go to Radiology in the basement level for your X-Ray today  4)  You will be contacted about the referral(s) to: ENT 5)  Please schedule a follow-up appointment in 6 months with CPX labs Prescriptions: OMEPRAZOLE 20 MG CPDR (OMEPRAZOLE) 1 by mouth once daily  #90 x 3   Entered and Authorized by:   Corwin Levins MD   Signed by:   Corwin Levins MD on 10/17/2009   Method used:   Print then Give to Patient   RxID:   [id number]

## 2010-08-27 NOTE — Progress Notes (Signed)
Summary: addon lab  Phone Note From Other Clinic   Caller: Lab Summary of Call: lab called with msg from patient. She is getting labs done this morning and would like her potassium checked also as she is still having cramps. The lab said it could be a addon today, just let me know. Initial call taken by: Scharlene Gloss,  August 30, 2009 9:02 AM  Follow-up for Phone Call        ok for bmet, magnesium - 729.82 Follow-up by: Corwin Levins MD,  August 30, 2009 12:52 PM    sent addon to the lab

## 2010-08-27 NOTE — Miscellaneous (Signed)
Summary: DIRECT COLON SCREEN-AGE/YF  Clinical Lists Changes  Medications: Added new medication of MOVIPREP 100 GM  SOLR (PEG-KCL-NACL-NASULF-NA ASC-C) As directed - Signed Rx of MOVIPREP 100 GM  SOLR (PEG-KCL-NACL-NASULF-NA ASC-C) As directed;  #1 x 0;  Signed;  Entered by: Clide Cliff RN;  Authorized by: Hart Carwin MD;  Method used: Electronically to [facility]*, [address], [place], [place]  [address], Ph: [phone] or [phone], Fax: [phone] Observations: Added new observation of NKA: T (09/04/2009 9:39)    Prescriptions: MOVIPREP 100 GM  SOLR (PEG-KCL-NACL-NASULF-NA ASC-C) As directed  #1 x 0   Entered by:   Clide Cliff RN   Authorized by:   Hart Carwin MD   Signed by:   Clide Cliff RN on 09/04/2009   Method used:   Electronically to        [facility]* (retail)       [address] [place], [place]  [address]       Ph: [phone] or [phone]       Fax: [phone]   RxID:   [id number]

## 2010-08-27 NOTE — Assessment & Plan Note (Signed)
Summary: LEG CRAMPS/ FROM MEDS? /NWS  #   Vital Signs:  Patient profile:   65 year old female Height:      63 inches Weight:      127.38 pounds BMI:     22.65 O2 Sat:      96 % on Room air Temp:     98.3 degrees F oral Pulse rate:   60 / minute BP sitting:   110 / 60  (left arm) Cuff size:   regular  Vitals Entered ByZella Ball Ewing (September 19, 2009 8:03 AM)  O2 Flow:  Room air CC: vomiting   CC:  vomiting.  History of Present Illness: hAd to stop the statin for last 4 days due to itch and cramps now resolved off the med;  requests change in med;  nasal allergy symtpoms worse inthe past few months with itch, sneeze and congesiton but no colored d/c, ST or cough;  Pt denies CP, sob, doe, wheezing, orthopnea, pnd, worsening LE edema, palps, dizziness or syncope .  No wheezing , sob, doe.  Has had new onset depressive symtpoms in the past month with fatigue, low mood, sleeping more, wt change, but no psyhosis/hallucinations or suicidal ideation or panic.  Overall good complaicne with meds, o/w good tolerance.    Problems Prior to Update: 1)  Depression  (ICD-311) 2)  Asthma  (ICD-493.90) 3)  Allergic Rhinitis  (ICD-477.9) 4)  Hyperlipidemia  (ICD-272.4) 5)  Preventive Health Care  (ICD-V70.0) 6)  Wheezing  (ICD-786.07) 7)  COPD  (ICD-496)  Medications Prior to Update: 1)  Proair Hfa 108 (90 Base) Mcg/act Aers (Albuterol Sulfate) .... 2 Puffs Four Times Per Day As Needed For Shortness of Breath 2)  Symbicort 160-4.5 Mcg/act Aero (Budesonide-Formoterol Fumarate) .... 2 Puffs Two Times A Day 3)  Aspir-Low 81 Mg Tbec (Aspirin) .[name].. 1po Once Daily 4)  Simvastatin 40 Mg Tabs (Simvastatin) .[name].. 1 By Mouth Once Daily  Current Medications (verified): 1)  Proair Hfa 108 (90 Base) Mcg/act Aers (Albuterol Sulfate) .... 2 Puffs Four Times Per Day As Needed For Shortness of Breath 2)  Symbicort 160-4.5 Mcg/act Aero (Budesonide-Formoterol Fumarate) .... 2 Puffs Two Times A Day 3)  Aspir-Low  81 Mg Tbec (Aspirin) .[name].. 1po Once Daily 4)  Pravastatin Sodium 40 Mg Tabs (Pravastatin Sodium) .[name].. 1 By Mouth Once Daily 5)  Bupropion Hcl 150 Mg Xr24h-Tab (Bupropion Hcl) .[name].. 1po Once Daily 6)  Fexofenadine Hcl 180 Mg Tabs (Fexofenadine Hcl) .[name].. 1po Once Daily  Allergies (verified): 1)  ! 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   Head:  normocephalic and atraumatic.   Eyes:  vision grossly intact, pupils equal, and pupils round.   Ears:  R ear normal and L ear normal.   Nose:  nasal dischargemucosal pallor and mucosal edema.   Mouth:  pharyngeal erythema and fair dentition.   Neck:  supple and no masses.   Lungs:  normal respiratory effort and normal breath sounds.   Heart:  normal rate and regular rhythm.   Abdomen:  soft, non-tender, and normal bowel sounds.   Msk:  no joint tenderness and no joint swelling.   Extremities:  no edema, no erythema  Psych:  depressed affect and moderately anxious.     Impression & Recommendations:  Problem # 1:  HYPERLIPIDEMIA (ICD-272.4)  Her updated medication list for this problem includes:    Pravastatin Sodium 40 Mg Tabs (Pravastatin sodium) .[name]... 1 by mouth once daily to change statin as above, f/u labs in 4 wks  Labs Reviewed: SGOT: 33 (08/30/2009)   SGPT: 28 (08/30/2009)   HDL:81.30 (08/30/2009), 90.50 (07/30/2009)  LDL:83 (08/30/2009)  Chol:174 (08/30/2009), 284  (07/30/2009)  Trig:47.0 (08/30/2009), 85.0 (07/30/2009)  Problem # 2:  ALLERGIC RHINITIS (ICD-477.9)  Her updated medication list for this problem includes:    Fexofenadine Hcl 180 Mg Tabs (Fexofenadine hcl) .[name]... 1po once daily treat as above, f/u any worsening signs or symptoms   Problem # 3:  ASTHMA (ICD-493.90)  Her updated medication list for this problem includes:    Proair Hfa 108 (90 Base) Mcg/act Aers (Albuterol sulfate) .[name]... 2 puffs four times per day as needed for shortness of breath    Symbicort 160-4.5 Mcg/act Aero (Budesonide-formoterol fumarate) .[name]... 2 puffs two times a day stable overall by hx and exam, ok to continue meds/tx as is   Problem # 4:  DEPRESSION (ICD-311)  Her updated medication list for this problem includes:    Bupropion Hcl 150 Mg Xr24h-tab (Bupropion hcl) .[name]... 1po once daily treat as above, f/u any worsening signs or symptoms   Complete Medication List: 1)  Proair Hfa 108 (90 Base) Mcg/act Aers (Albuterol sulfate) .... 2 puffs four times per day as needed for shortness of breath 2)  Symbicort 160-4.5 Mcg/act Aero (Budesonide-formoterol fumarate) .... 2 puffs two times a day 3)  Aspir-low 81 Mg Tbec (Aspirin) .[name].. 1po once daily 4)  Pravastatin Sodium 40 Mg Tabs (Pravastatin sodium) .[name].. 1 by mouth once daily 5)  Bupropion Hcl 150 Mg Xr24h-tab (Bupropion hcl) .[name].. 1po once daily 6)  Fexofenadine Hcl 180 Mg Tabs (Fexofenadine hcl) .[name].. 1po once daily  Patient Instructions: 1)  stop the simvastatin  2)  start the pravastatin 40  mg per day 3)  start the generic of the allegra for the allergies 4)  start the generic for the wellbutrin (buproprion) - 1 per day 5)  Continue all previous medications as before this visit  6)  Please schedule a follow-up appointment in 1 month with: 7)  Hepatic Panel prior to visit, ICD-9: v58.69 8)  Lipid Panel prior to visit, ICD-9: 272.0 9)  (all meds sent to the pharmacy) Prescriptions: FEXOFENADINE HCL 180 MG  TABS (FEXOFENADINE HCL) 1po once daily  #90 x 3   Entered and Authorized by:   Corwin Levins MD   Signed by:   Corwin Levins MD on 09/19/2009   Method used:   Electronically to        [facility]* (retail)       [address] [place], [place]  [address]       Ph: [phone] or [phone]       Fax: [phone]   RxID:   [id number] BUPROPION HCL 150 MG XR24H-TAB (BUPROPION HCL) 1po once daily  #90 x 3   Entered and Authorized by:   Corwin Levins  MD   Signed by:   Corwin Levins MD on 09/19/2009   Method used:   Electronically to        [facility]* (retail)       [address] [place], [place]  [address]       Ph: [phone] or [phone]       Fax: [phone]   RxID:   [id number] PRAVASTATIN SODIUM 40 MG TABS (PRAVASTATIN SODIUM) 1 by mouth once daily  #90 x 3   Entered and Authorized by:   Corwin Levins MD   Signed by:   Corwin Levins MD on 09/19/2009   Method used:   Electronically to        [facility]* (retail)       [address] [place], [place]  [address]       Ph: [phone] or [phone]       Fax: [phone]   RxID:   [id number]

## 2010-08-27 NOTE — Assessment & Plan Note (Signed)
Summary: BREATHING PROBLEMS/NWS  #   Vital Signs:  Patient profile:   65 year old female Height:      62 inches Weight:      123.13 pounds BMI:     22.60 O2 Sat:      95 % on Room air Temp:     98 degrees F oral Pulse rate:   55 / minute BP sitting:   122 / 80  (left arm) Cuff size:   regular  Vitals Entered ByZella Ball Ewing (Dec 19, 2009 10:08 AM)  O2 Flow:  Room air CC: Breathing problems/RE   CC:  Breathing problems/RE.  History of Present Illness: here with gradual onset x 3 to 4 days worsening mild nonprod cough, wheezing and sob/doe;  Pt denies CP, orthopnea, pnd, worsening LE edema, palps, dizziness or syncope  No fever, headache or ST,  overall good compliacne with meds and good tolerance.  Also has to start allegra recetnly for mild nasall allergy symptoms without significant congesiton, seems to be working well.  Also now taking 4 fish oil per day for cholesterol and wants to know if this is ok.  Pt denies new neuro symptoms such as headache, facial or extremity weakness   Problems Prior to Update: 1)  Chronic Obstructive Pulmonary Disease, Acute Exacerbation  (ICD-491.21) 2)  Hoarseness  (ICD-784.42) 3)  Depression  (ICD-311) 4)  Asthma  (ICD-493.90) 5)  Allergic Rhinitis  (ICD-477.9) 6)  Hyperlipidemia  (ICD-272.4) 7)  Preventive Health Care  (ICD-V70.0) 8)  Wheezing  (ICD-786.07) 9)  COPD  (ICD-496)  Medications Prior to Update: 1)  Proair Hfa 108 (90 Base) Mcg/act Aers (Albuterol Sulfate) .... 2 Puffs Four Times Per Day As Needed For Shortness of Breath 2)  Symbicort 160-4.5 Mcg/act Aero (Budesonide-Formoterol Fumarate) .... 2 Puffs Two Times A Day 3)  Aspir-Low 81 Mg Tbec (Aspirin) .[name].. 1po Once Daily 4)  Pravastatin Sodium 40 Mg Tabs (Pravastatin Sodium) .[name].. 1 By Mouth Once Daily 5)  Bupropion Hcl 150 Mg Xr24h-Tab (Bupropion Hcl) .[name].. 1po Once Daily 6)  Fexofenadine Hcl 180 Mg Tabs (Fexofenadine Hcl) .[name].. 1po Once Daily 7)  Omeprazole 20 Mg Cpdr  (Omeprazole) .[name].. 1 By Mouth Once Daily  Current Medications (verified): 1)  Proair Hfa 108 (90 Base) Mcg/act Aers (Albuterol Sulfate) .... 2 Puffs Four Times Per Day As Needed For Shortness of Breath 2)  Symbicort 160-4.5 Mcg/act Aero (Budesonide-Formoterol Fumarate) .... 2 Puffs Two Times A Day 3)  Aspir-Low 81 Mg Tbec (Aspirin) .[name].. 1po Once Daily 4)  Pravastatin Sodium 40 Mg Tabs (Pravastatin Sodium) .[name].. 1 By Mouth Once Daily 5)  Bupropion Hcl 150 Mg Xr24h-Tab (Bupropion Hcl) .[name].. 1po Once Daily 6)  Fexofenadine Hcl 180 Mg Tabs (Fexofenadine Hcl) .[name].. 1po Once Daily 7)  Omeprazole 20 Mg Cpdr (Omeprazole) .[name].. 1 By Mouth Once Daily 8)  Allegra 180 Mg Tabs (Fexofenadine Hcl) .[name].. 1 By Mouth Once Daily As Needed (Otc) 9)  Prednisone (Pak) 10 Mg Tabs (Prednisone) .... 3po Qd For 3days, Then 2po Qd For 3days, Then 1po Qd For 3days, Then Stop  Allergies (verified): 1)  ! 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   Head:  normocephalic and atraumatic.   Eyes:  vision grossly intact, pupils equal, and pupils round.   Ears:  R ear normal and L ear normal.   Nose:  no edema, no erythema  Mouth:  pharyngeal erythema and fair dentition.   Neck:  supple and no masses.   Lungs:  normal respiratory effort, R decreased breath sounds, R wheezes, L decreased breath sounds, and L wheezes.   Heart:   normal rate and regular rhythm.   Extremities:  no edema, no erythema    Impression & Recommendations:  Problem # 1:  CHRONIC OBSTRUCTIVE PULMONARY DISEASE, ACUTE EXACERBATION (ICD-491.21)  mild to mod, copd exac vs asthma exac - for depo shot today, and prednsone pack, if recurs may have to consider adding other controller med, no evidence for infection at this time  Orders: Depo- Medrol 40mg  (J1030) Depo- Medrol 80mg  (J1040) Admin of Therapeutic Inj  intramuscular or subcutaneous (56433)  Problem # 2:  ALLERGIC RHINITIS (ICD-477.9)  The following medications were removed from the medication list:    Allegra 180 Mg Tabs (Fexofenadine hcl) .[name]... 1 by mouth once daily as needed (otc) Her updated medication list for this problem includes:    Fexofenadine Hcl 180 Mg Tabs (Fexofenadine hcl) .[name]... 1po once daily treat as above, f/u any worsening signs or symptoms   Problem # 3:  HYPERLIPIDEMIA (ICD-272.4)  Her updated medication list for this problem includes:    Pravastatin Sodium 40 Mg Tabs (Pravastatin sodium) .[name]... 1 by mouth once daily  Labs Reviewed: SGOT: 32 (10/15/2009)   SGPT: 33 (10/15/2009)   HDL:101.60 (10/15/2009), 81.30 (08/30/2009)  LDL:83 (08/30/2009)  Chol:222 (10/15/2009), 174 (08/30/2009)  Trig:47.0 (10/15/2009), 47.0 (08/30/2009) d/w pt, Continue all previous medications as before this visit including the fish oil should be ok;  stable overall by hx and exam, ok to continue meds/tx as is , Pt to continue diet efforts, good med tolerance; - goal LDL less than 100  Complete Medication List: 1)  Proair Hfa 108 (90 Base) Mcg/act Aers (Albuterol sulfate) .... 2 puffs four times per day as needed for shortness of breath 2)  Symbicort 160-4.5 Mcg/act Aero (Budesonide-formoterol fumarate) .... 2 puffs two times a day 3)  Aspir-low 81 Mg Tbec (Aspirin) .[name].. 1po once daily 4)  Pravastatin Sodium 40 Mg Tabs (Pravastatin sodium) .[name].. 1 by mouth once daily 5)  Bupropion Hcl  150 Mg Xr24h-tab (Bupropion hcl) .[name].. 1po once daily 6)  Fexofenadine Hcl 180 Mg Tabs (Fexofenadine hcl) .[name].. 1po once daily 7)  Omeprazole 20 Mg Cpdr (Omeprazole) .[name].. 1 by mouth once daily 8)  Prednisone (pak) 10 Mg Tabs (Prednisone) .... 3po qd for 3days, then 2po qd for 3days, then 1po qd for 3days, then stop  Patient Instructions: 1)  you had the steroid shot today 2)  Please take all new medications as prescribed 3)  Continue all previous medications as before this visit  4)  Please schedule a follow-up appointment in 4 months with CPX labs Prescriptions: PREDNISONE (PAK) 10 MG TABS (PREDNISONE) 3po qd for 3days, then 2po qd for 3days, then 1po qd for 3days, then stop  #18 x 0   Entered and Authorized by:   Corwin Levins MD   Signed by:   Len Blalock  Ladonte Verstraete MD on 12/19/2009   Method used:   Print then Give to Patient   RxID:   [id number]    Medication Administration  Injection # 1:    Medication: Depo- Medrol 40mg     Diagnosis: CHRONIC OBSTRUCTIVE PULMONARY DISEASE, ACUTE EXACERBATION (ICD-491.21)    Route: IM    Site: LUOQ gluteus    Exp Date: 05/2012    Lot #: [id number]    Mfr: [employer]    Given by: Zella Ball Ewing (Dec 19, 2009 11:07 AM)  Injection # 2:    Medication: Depo- Medrol 80mg     Diagnosis: CHRONIC OBSTRUCTIVE PULMONARY DISEASE, ACUTE EXACERBATION (ICD-491.21)    Route: IM    Site: LUOQ gluteus    Exp Date: 05/2012    Lot #: [id number]    Mfr: [employer]    Given by: Zella Ball Ewing (Dec 19, 2009 11:07 AM)  Orders Added: 1)  Depo- Medrol 40mg  [J1030] 2)  Depo- Medrol 80mg  [J1040] 3)  Admin of Therapeutic Inj  intramuscular or subcutaneous [96372] 4)  Est. Patient Level IV [42706]

## 2010-08-27 NOTE — Assessment & Plan Note (Signed)
Summary: flu shot/Michele Martin/cd  Nurse Visit   Allergies: 1)  ! Simvastatin  Orders Added: 1)  Flu Vaccine 72yrs + MEDICARE PATIENTS [Q2039] 2)  Administration Flu vaccine - MCR [G0008] Flu Vaccine Consent Questions     Do you have a history of severe allergic reactions to this vaccine? no    Any prior history of allergic reactions to egg and/or gelatin? no    Do you have a sensitivity to the preservative Thimersol? no    Do you have a past history of Guillan-Barre Syndrome? no    Do you currently have an acute febrile illness? no    Have you ever had a severe reaction to latex? no    Vaccine information given and explained to patient? yes    Are you currently pregnant? no    Lot Number:[id number]   Exp Date:01/25/2011   Site Given  Left Deltoid IMistration Flu vaccine - MCR [G0008] .lbmedflu

## 2010-08-27 NOTE — Assessment & Plan Note (Signed)
Summary: CPX/BCBS/#/CD   Vital Signs:  Patient profile:   65 year old female Height:      63 inches Weight:      123 pounds BMI:     21.87 O2 Sat:      98 % on Room air Temp:     98.4 degrees F oral Pulse rate:   58 / minute BP sitting:   120 / 70  (left arm) Cuff size:   regular  Vitals Entered ByZella Ball Ewing (July 30, 2009 10:31 AM)  O2 Flow:  Room air  CC: adult physical/RE   CC:  adult physical/RE.  History of Present Illness: overall doing well, except having to use the proari sometimes more than 3 to 4 times per day;  worst part of the day is mobilizing secreetions in the AM, and has occasional wheezing during the day as well with some mild sob, doe;  does not limit her function and walking distance;  Pt denies CP,  orthopnea, pnd, worsening LE edema, palps, dizziness or syncope .  Pt denies new neuro symptoms such as headache, facial or extremity weakness   Problems Prior to Update: 1)  Wheezing  (ICD-786.07) 2)  COPD  (ICD-496)  Medications Prior to Update: 1)  Azithromycin 250 Mg Tabs (Azithromycin) .... 2po Qd For 1 Day, Then 1po Qd For 4days, Then Stop 2)  Proair Hfa 108 (90 Base) Mcg/act Aers (Albuterol Sulfate) .... 2 Puffs Four Times Per Day As Needed For Shortness of Breath 3)  Prednisone 10 Mg Tabs (Prednisone) .... 3po Qd For 3days, Then 2po Qd For 3days, Then 1po Qd For 3days, Then Stop 4)  Hydrocodone-Homatropine 5-1.5 Mg/87ml Syrp (Hydrocodone-Homatropine) .[name].. 1 Tsp By Mouth Q 6 Hrs As Needed Cough  Current Medications (verified): 1)  Proair Hfa 108 (90 Base) Mcg/act Aers (Albuterol Sulfate) .... 2 Puffs Four Times Per Day As Needed For Shortness of Breath 2)  Symbicort 160-4.5 Mcg/act Aero (Budesonide-Formoterol Fumarate) .... 2 Puffs Two Times A Day 3)  Aspir-Low 81 Mg Tbec (Aspirin) .[name].. 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         all otherwise negative per pt -  very difficult but so far has avoided re-start smoking in the past yr; gained 20 lbs per pt  Physical Exam  General:  alert and well-developed.   Head:  normocephalic and atraumatic.   Eyes:  vision grossly intact, pupils equal, and pupils round.   Ears:  R ear normal and L ear normal.   Nose:  no external deformity and no nasal discharge.   Mouth:  no gingival abnormalities and pharynx pink  and moist.   Neck:  supple and no masses.   Lungs:  normal respiratory effort, R decreased breath sounds, and L decreased breath sounds.   Heart:  normal rate and regular rhythm.   Abdomen:  soft, non-tender, and normal bowel sounds.   Msk:  no joint tenderness and no joint swelling.   Extremities:  no edema, no erythema  Neurologic:  cranial nerves II-XII intact and strength normal in all extremities.     Impression & Recommendations:  Problem # 1:  Preventive Health Care (ICD-V70.0)  Overall doing well, up to date,  counseled on routine health concerns for screening and prevention, immunizations up to date or declined, labs reviewed, ecg reviewed   Orders: EKG w/ Interpretation (93000) Gastroenterology Referral (GI)  Problem # 2:  COPD (ICD-496)  Her updated medication list for this problem includes:    Proair Hfa 108 (90 Base) Mcg/act Aers (Albuterol sulfate) .[name]... 2 puffs four times per day as needed for shortness of breath    Symbicort 160-4.5 Mcg/act Aero (Budesonide-formoterol fumarate) .[name]... 2 puffs two times a day to add the symbicort as above, sample given and rx  Complete Medication List: 1)  Proair Hfa 108 (90 Base) Mcg/act Aers (Albuterol sulfate) .... 2 puffs four times per day as needed for shortness of breath 2)  Symbicort 160-4.5 Mcg/act Aero (Budesonide-formoterol fumarate) .... 2 puffs two times a day 3)  Aspir-low 81 Mg Tbec (Aspirin) .[name].. 1po once daily  Other Orders: Tdap => 66yrs IM ([address]109) Admin 1st Vaccine (60454)  Patient Instructions: 1)  please call the number on the blue card for the lab test results 2)  start the symbicort as prescribed- 2 puffs twice per day 3)  You can also use Mucinex OTC or it's generic for congestion  4)  Continue all previous medications as before this visit  - the proair should be as needed only 5)  please call for your yearly mamogram - consider [facility] on [address] ([address]) 6)  you had the tetanus shot today 7)  You will be contacted about the referral(s) to: colonoscopy 8)  Please schedule a follow-up appointment in 6 months or sooner if needed 9)  Take an Aspirin every day - 81 mg - 1 per day - COATED only Prescriptions: PROAIR HFA 108 (90 BASE) MCG/ACT AERS (ALBUTEROL SULFATE) 2 puffs four times per day as needed for shortness of breath  #1 x 11   Entered and Authorized by:   Corwin Levins MD   Signed by:   Corwin Levins MD on 07/30/2009   Method used:   Electronically to        [facility]* (retail)        [address] [place], [place]  [address]       Ph: [phone] or [phone]       Fax: [phone]   RxID:   [id number] SYMBICORT 160-4.5 MCG/ACT AERO (BUDESONIDE-FORMOTEROL FUMARATE) 2 puffs two times a day  #1 x 11   Entered and Authorized by:   Corwin Levins MD   Signed by:   Corwin Levins MD on 07/30/2009   Method used:   Electronically to        [facility]* (retail)       [address]  [place], [place]  [address]       Ph: [phone] or [phone]       Fax: [phone]   RxID:   [id number]    Immunizations Administered:  Tetanus Vaccine:    Vaccine Type: Tdap    Site: right deltoid    Mfr: GlaxoSmithKline    Dose: 0.5 ml    Route: IM    Given by: Robin Ewing    Exp. Date: 09/22/2011    Lot #: [id number]    VIS given: 06/15/07 version given July 30, 2009.

## 2010-08-27 NOTE — Progress Notes (Signed)
Summary: ALT med  Phone Note Call from Patient Call back at [facility] Phone [phone]   Caller: Patient Summary of Call: Pt called stating that Metoprolol is causing swelling in her feet and ankles. Pt is requesting alternate medication. Initial call taken by: Margaret Pyle, CMA,  March 19, 2010 9:42 AM  Follow-up for Phone Call        metoprolol does not do this (please disregard if this is on the side effect)  consider OV if leg sweling is getting worse Follow-up by: Corwin Levins MD,  March 19, 2010 12:25 PM  Additional Follow-up for Phone Call Additional follow up Details #1::        pt advised and states that she wil continue with medications and call back if swelling worsens for OV Additional Follow-up by: Margaret Pyle, CMA,  March 19, 2010 1:26 PM

## 2010-08-27 NOTE — Progress Notes (Signed)
    Immunization History:  Influenza Immunization History:    Influenza:  historical (03/26/2009)

## 2010-08-29 NOTE — Miscellaneous (Signed)
Summary: BONE DENSITY  Clinical Lists Changes  Orders: Added new Test order of T-Lumbar Vertebral Assessment (77082) - Signed 

## 2010-08-30 NOTE — Consult Note (Signed)
Summary: [facility]  [facility]   Imported By: Lester Grenville 10/30/2009 09:47:36  _____________________________________________________________________  External Attachment:    Type:   Image     Comment:   External Document

## 2010-09-04 NOTE — Assessment & Plan Note (Signed)
Summary: 6 mos f/u #/cd   Vital Signs:  Patient profile:   65 year old female Height:      62 inches Weight:      115.50 pounds BMI:     21.20 O2 Sat:      94 % on Room air Temp:     97.5 degrees F oral Pulse rate:   72 / minute BP sitting:   100 / 68  (left arm) Cuff size:   regular  Vitals Entered By: Zella Ball Ewing CMA Duncan Dull) (August 20, 2010 10:44 AM)  O2 Flow:  Room air  Preventive Care Screening     decliens coonoscopy - afraid of the prep  CC: 6 month ROV/RE   CC:  6 month ROV/RE.  History of Present Illness: here for wellness and f/u;  overall doing ok; incidently today with 2-3 days mild ST, and increaseingly prod cough with greenish sputum, and general weakness and malaise;  Pt denies CP, worsening sob, doe, wheezing, orthopnea, pnd, worsening LE edema, palps, dizziness or syncope   Pt denies new neuro symptoms such as headache, facial or extremity weakness  Pt denies polydipsia, polyuria  Overall good compliance with meds, trying to follow low chol  diet, wt stable, little excercise however  No recent wt loss, night sweats, loss of appetite or other constitutional symptoms   Overall good compliance with meds, and good tolerability.  Denies worsening depressive symptoms, suicidal ideation, or panic.   Pt states good ability with ADL's, low fall risk, home safety reviewed and adequate, no significant change in hearing or vision, trying to follow lower chol diet, and occasionally active only with regular excercise.   Preventive Screening-Counseling & Management      Drug Use:  no.    Problems Prior to Update: 1)  Menopausal Disorder  (ICD-627.9) 2)  Bronchitis-acute  (ICD-466.0) 3)  Anxiety  (ICD-300.00) 4)  Back Pain  (ICD-724.5) 5)  Tremor  (ICD-781.0) 6)  Acute Angle-closure Glaucoma  (ICD-365.22) 7)  Chronic Obstructive Pulmonary Disease, Acute Exacerbation  (ICD-491.21) 8)  Hoarseness  (ICD-784.42) 9)  Depression  (ICD-311) 10)  Asthma  (ICD-493.90) 11)   Allergic Rhinitis  (ICD-477.9) 12)  Hyperlipidemia  (ICD-272.4) 13)  Preventive Health Care  (ICD-V70.0) 14)  Wheezing  (ICD-786.07) 15)  COPD  (ICD-496)  Medications Prior to Update: 1)  Proair Hfa 108 (90 Base) Mcg/act Aers (Albuterol Sulfate) .... 2 Puffs Four Times Per Day As Needed For Shortness of Breath 2)  Symbicort 160-4.5 Mcg/act Aero (Budesonide-Formoterol Fumarate) .... 2 Puffs Two Times A Day 3)  Aspir-Low 81 Mg Tbec (Aspirin) .[name].. 1po Once Daily 4)  Pravastatin Sodium 40 Mg Tabs (Pravastatin Sodium) .[name].. 1 By Mouth Once Daily 5)  Bupropion Hcl 150 Mg Xr24h-Tab (Bupropion Hcl) .[name].. 1po Once Daily 6)  Fexofenadine Hcl 180 Mg Tabs (Fexofenadine Hcl) .[name].. 1po Once Daily 7)  Omeprazole 20 Mg Cpdr (Omeprazole) .[name].. 1 By Mouth Once Daily 8)  Prednisone (Pak) 10 Mg Tabs (Prednisone) .... 3po Qd For 3days, Then 2po Qd For 3days, Then 1po Qd For 3days, Then Stop 9)  Hydrocodone-Acetaminophen 7.5-325 Mg Tabs (Hydrocodone-Acetaminophen) .[name].. 1 By Mouth Q 6 Hrs As Needed Pain 10)  Cyclobenzaprine Hcl 5 Mg Tabs (Cyclobenzaprine Hcl) .[name].. 1po Three Times A Day As Needed Pain 11)  Metoprolol Succinate 25 Mg Xr24h-Tab (Metoprolol Succinate) .[name].. 1 By Mouth Once Daily 12)  Azithromycin 250 Mg Tabs (Azithromycin) .... 2po Qd For 1 Day, Then 1po Qd For 4days, Then Stop 13)  Prednisone 10 Mg Tabs (Prednisone) .... 3po Qd For 3days, Then 2po Qd For 3days, Then 1po Qd For 3days, Then Stop  Current Medications (verified): 1)  Proair Hfa 108 (90 Base) Mcg/act Aers (Albuterol Sulfate) .... 2 Puffs Four Times Per Day As Needed For Shortness of Breath 2)  Symbicort 160-4.5 Mcg/act Aero (Budesonide-Formoterol Fumarate) .... 2 Puffs Two Times A Day 3)  Aspir-Low 81 Mg Tbec (Aspirin) .[name].. 1po Once Daily 4)  Bupropion Hcl 150 Mg Xr24h-Tab (Bupropion Hcl) .[name].. 1po Once Daily 5)  Fexofenadine Hcl 180 Mg Tabs (Fexofenadine Hcl) .[name].. 1po Once Daily 6)  Metoprolol Succinate 25 Mg Xr24h-Tab (Metoprolol Succinate)  .[name].. 1 By Mouth Once Daily 7)  Levofloxacin 250 Mg Tabs (Levofloxacin) .[name].. 1po Once Daily For 7 Days 8)  Lipitor 10 Mg Tabs (Atorvastatin Calcium) .... Generic  - 1 By Mouth Once Daily  Allergies (verified): 1)  ! Simvastatin 2)  ! 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 Smoker - quit early 2010 after long term Alcohol use-no Drug use-no  Risk Factors: Smoking Status: quit (06/27/2009)  Social History: widow since 2000 2 children work  - part time/mostly retired - Neurosurgeon Former Smoker - quit early 2010 after long term Alcohol use-no Drug use-no Drug Use:  no  Review of Systems  The patient denies anorexia, fever, vision loss, decreased hearing, hoarseness, chest pain, syncope, dyspnea on exertion, peripheral edema, prolonged cough, headaches, hemoptysis, abdominal pain, melena, hematochezia, severe indigestion/heartburn, hematuria, muscle weakness, suspicious skin lesions, transient blindness, difficulty walking, depression, unusual weight change, abnormal bleeding, enlarged lymph nodes, and angioedema.         all otherwise negative per pt -    Physical Exam  General:  alert and well-developed.  , mild ill  Head:  normocephalic and atraumatic.   Eyes:  vision grossly intact, pupils equal, and pupils round.   Ears:  bilat tm's red, sinus nontender Nose:  nasal dischargemucosal pallor and  mucosal edema.   Mouth:  pharyngeal erythema and fair dentition.   Neck:  supple and no masses.   Lungs:  normal respiratory effort, R decreased breath sounds, and L decreased breath sounds without wheeze or rales Heart:  normal rate and regular rhythm.   Abdomen:  soft, non-tender, and normal bowel sounds.   Msk:  no joint tenderness and no joint swelling. , no spine tender or paravertebral tender  Extremities:  no edema, no erythema  Neurologic:  strength normal in all extremities, sensation intact to light touch, gait normal, and DTRs symmetrical and normal.  , tremor noted bilat - mild Skin:  color normal and no rashes.   Psych:  not anxious appearing and not depressed appearing.     Impression & Recommendations:  Problem # 1:  Preventive Health Care (ICD-V70.0) Overall doing well, age appropriate education and counseling updated, referral for preventive services and immunizations addressed, dietary counseling and smoking status adressed , most recent labs reviewed I have personally reviewed and have noted 1.The patient's medical and social history  2.Their use of alcohol, tobacco or illicit drugs 3.Their current medications and supplements 4. Functional ability including ADL's, fall risk, home safety risk, hearing & visual impairment 5.Diet and physical activities 6.Evidence for depression or mood disorders The patients weight, height, BMI  have been recorded in the chart I have made referrals, counseling and provided education to the patient based review of the above   Problem # 2:  BRONCHITIS-ACUTE (ICD-466.0)  Her updated medication list for this problem includes:    Proair Hfa 108 (90 Base) Mcg/act Aers (Albuterol sulfate) .[name]... 2 puffs four times per day as needed for shortness of breath    Symbicort 160-4.5 Mcg/act Aero (Budesonide-formoterol fumarate) .[name]... 2 puffs two times a day    Levofloxacin 250 Mg Tabs (Levofloxacin) .[name]... 1po once daily for 7 days treat as above,  f/u any worsening signs or symptoms   Problem # 3:  COPD (ICD-496)  Her updated medication list for this problem includes:    Proair Hfa 108 (90 Base) Mcg/act Aers (Albuterol sulfate) .[name]... 2 puffs four times per day as needed for shortness of breath    Symbicort 160-4.5 Mcg/act Aero (Budesonide-formoterol fumarate) .[name]... 2 puffs two times a day stable overall by hx and exam, ok to continue meds/tx as is   Complete Medication List: 1)  Proair Hfa 108 (90 Base) Mcg/act Aers (Albuterol sulfate) .... 2 puffs four times per day as needed for shortness of breath 2)  Symbicort 160-4.5 Mcg/act Aero (Budesonide-formoterol fumarate) .... 2 puffs two times a day 3)  Aspir-low 81 Mg Tbec (Aspirin) .[name].. 1po once daily 4)  Bupropion Hcl 150 Mg Xr24h-tab (Bupropion hcl) .[name].. 1po once daily 5)  Fexofenadine Hcl 180 Mg Tabs (Fexofenadine hcl) .[name].. 1po once daily 6)  Metoprolol Succinate 25 Mg Xr24h-tab (Metoprolol succinate) .[name].. 1 by mouth once daily 7)  Levofloxacin 250 Mg Tabs (Levofloxacin) .[name].. 1po once daily for 7 days 8)  Lipitor 10 Mg Tabs (Atorvastatin calcium) .... Generic  - 1 by mouth once daily  Other Orders: T-Bone Densitometry [phone])  Patient Instructions: 1)  Please take all new medications as prescribed - the antibiotic, and the lower dose generic lipitor 2)  please call if the wheezing is worse in the next few days to consider prednisone 3)  Please schedule your bone density test before leaving today 4)  Please call if you change your mind about having the colonoscopy, so we can re-order it 5)  Please return for LAB only in 4 wks to check on the cholesterol with: 6)  Hepatic Panel, ICD-9: v58.69 7)  Lipid Panel, ICD-9:250.02 8)  Please schedule a follow-up appointment in 6 months, or sooner if needed Prescriptions: PROAIR HFA 108 (90 BASE) MCG/ACT AERS (ALBUTEROL SULFATE) 2 puffs four times per day as needed for shortness of breath  #3 x 3   Entered and Authorized by:   Corwin Levins MD   Signed by:   Corwin Levins MD on 08/20/2010   Method used:   Print then Give to Patient   RxID:   [id number] SYMBICORT 160-4.5 MCG/ACT AERO (BUDESONIDE-FORMOTEROL FUMARATE) 2 puffs two times a day  #3 x 3   Entered and Authorized by:   Corwin Levins MD   Signed by:   Corwin Levins MD on 08/20/2010   Method used:   Print then Give to Patient   RxID:   [id number] BUPROPION HCL 150 MG XR24H-TAB (BUPROPION HCL) 1po once daily  #90 x 3  Entered and Authorized by:   Corwin Levins MD   Signed by:   Corwin Levins MD on 08/20/2010   Method used:   Print then Give to Patient   RxID:   [id number] LIPITOR 10 MG TABS (ATORVASTATIN CALCIUM) generic  - 1 by mouth once daily  #90 x 3   Entered and Authorized by:   Corwin Levins MD   Signed by:   Corwin Levins MD on 08/20/2010   Method used:   Print then Give to Patient   RxID:   [id number] LEVOFLOXACIN 250 MG TABS (LEVOFLOXACIN) 1po once daily for 7 days  #7 x 0   Entered and Authorized by:   Corwin Levins MD   Signed by:   Corwin Levins MD on 08/20/2010   Method used:   Print then Give to Patient   RxID:   [id number]    Orders Added: 1)  T-Bone Densitometry [77080] 2)  Est. Patient 65& > [56387]

## 2010-09-09 ENCOUNTER — Telehealth: Payer: Self-pay | Admitting: Internal Medicine

## 2010-09-10 ENCOUNTER — Other Ambulatory Visit: Payer: Self-pay

## 2010-09-10 ENCOUNTER — Encounter: Payer: Self-pay | Admitting: Internal Medicine

## 2010-09-10 ENCOUNTER — Ambulatory Visit (INDEPENDENT_AMBULATORY_CARE_PROVIDER_SITE_OTHER): Payer: Medicare Other | Admitting: Internal Medicine

## 2010-09-10 DIAGNOSIS — J069 Acute upper respiratory infection, unspecified: Secondary | ICD-10-CM

## 2010-09-10 DIAGNOSIS — H9319 Tinnitus, unspecified ear: Secondary | ICD-10-CM

## 2010-09-10 DIAGNOSIS — M949 Disorder of cartilage, unspecified: Secondary | ICD-10-CM

## 2010-09-10 DIAGNOSIS — M899 Disorder of bone, unspecified: Secondary | ICD-10-CM

## 2010-09-10 DIAGNOSIS — J45909 Unspecified asthma, uncomplicated: Secondary | ICD-10-CM

## 2010-09-10 HISTORY — DX: Acute upper respiratory infection, unspecified: J06.9

## 2010-09-10 HISTORY — DX: Tinnitus, unspecified ear: H93.19

## 2010-09-10 HISTORY — DX: Disorder of bone, unspecified: M89.9

## 2010-09-16 ENCOUNTER — Encounter (INDEPENDENT_AMBULATORY_CARE_PROVIDER_SITE_OTHER): Payer: Self-pay | Admitting: *Deleted

## 2010-09-16 ENCOUNTER — Other Ambulatory Visit: Payer: Medicare Other

## 2010-09-16 ENCOUNTER — Other Ambulatory Visit: Payer: Self-pay | Admitting: Internal Medicine

## 2010-09-16 DIAGNOSIS — Z79899 Other long term (current) drug therapy: Secondary | ICD-10-CM

## 2010-09-16 DIAGNOSIS — E1165 Type 2 diabetes mellitus with hyperglycemia: Secondary | ICD-10-CM

## 2010-09-16 LAB — LIPID PANEL
HDL: 78.9 mg/dL (ref >39.00)
LDL Cholesterol: 99 mg/dL (ref 0–99)
Triglycerides: 68 mg/dL (ref 0.0–149.0)
VLDL: 13.6 mg/dL (ref 0.0–40.0)

## 2010-09-16 LAB — HEPATIC FUNCTION PANEL
Albumin: 3.6 g/dL (ref 3.5–5.2)
Alkaline Phosphatase: 54 U/L (ref 39–117)
Total Protein: 6.1 g/dL (ref 6.0–8.3)

## 2010-09-18 NOTE — Progress Notes (Signed)
Summary: ringing in ear  Phone Note Call from Patient Call back at Home Phone [phone]   Caller: Patient Summary of Call: Pt called left msg on triage requesting md to call something in been having ringing in her ear. Called pt back let her know she would need ov before anything could be call in. Transferred to schedulers to make appt Initial call taken by: Orlan Leavens RMA,  September 09, 2010 1:20 PM

## 2010-09-18 NOTE — Assessment & Plan Note (Signed)
Summary: ringing in ears/#   Vital Signs:  Patient profile:   65 year old female Height:      62 inches Weight:      114.50 pounds BMI:     21.02 O2 Sat:      97 % on Room air Temp:     98 degrees F oral Pulse rate:   66 / minute BP sitting:   120 / 68  (left arm) Cuff size:   regular  Vitals Entered By: Zella Ball Ewing CMA Duncan Dull) (September 10, 2010 2:38 PM)  O2 Flow:  Room air CC: Both ears ringing for 2 weeks/RE   CC:  Both ears ringing for 2 weeks/RE.  History of Present Illness: here with acute onset 3 days fever, nasal and sinus congestin, colored drainage, and mild ST, Headache, general weakness and malaise, but Pt denies CP, worsening sob, doe, wheezing, orthopnea, pnd, worsening LE edema, palps, dizziness or syncope  Pt denies new neuro symptoms such as headache, facial or extremity weakness  Pt denies polydipsia, polyuriia  Overall good compliance with meds, trying to follow low chol diet, wt stable, little excercise however   Also with tinnitus to left ear for 2 days as well, but no n/v, vertigo, hearing loss or ear d/c.   No recent  wt loss, night sweats, loss of appetite or other constitutional symptoms .  Denies worsening depressive symptoms, suicidal ideation, or panic, though has ongoing mild anxiety,  Asks to go over latest dxa as well, not always taking her calicum/ vit d  Problems Prior to Update: 1)  Osteopenia  (ICD-733.90) 2)  Tinnitus  (ICD-388.30) 3)  Uri  (ICD-465.9) 4)  Menopausal Disorder  (ICD-627.9) 5)  Anxiety  (ICD-300.00) 6)  Back Pain  (ICD-724.5) 7)  Tremor  (ICD-781.0) 8)  Acute Angle-closure Glaucoma  (ICD-365.22) 9)  Chronic Obstructive Pulmonary Disease, Acute Exacerbation  (ICD-491.21) 10)  Hoarseness  (ICD-784.42) 11)  Depression  (ICD-311) 12)  Asthma  (ICD-493.90) 13)  Allergic Rhinitis  (ICD-477.9) 14)  Hyperlipidemia  (ICD-272.4) 15)  Preventive Health Care  (ICD-V70.0) 16)  Wheezing  (ICD-786.07) 17)  COPD  (ICD-496)  Medications  Prior to Update: 1)  Proair Hfa 108 (90 Base) Mcg/act Aers (Albuterol Sulfate) .... 2 Puffs Four Times Per Day As Needed For Shortness of Breath 2)  Symbicort 160-4.5 Mcg/act Aero (Budesonide-Formoterol Fumarate) .... 2 Puffs Two Times A Day 3)  Aspir-Low 81 Mg Tbec (Aspirin) .[name].. 1po Once Daily 4)  Bupropion Hcl 150 Mg Xr24h-Tab (Bupropion Hcl) .[name].. 1po Once Daily 5)  Fexofenadine Hcl 180 Mg Tabs (Fexofenadine Hcl) .[name].. 1po Once Daily 6)  Metoprolol Succinate 25 Mg Xr24h-Tab (Metoprolol Succinate) .[name].. 1 By Mouth Once Daily 7)  Levofloxacin 250 Mg Tabs (Levofloxacin) .[name].. 1po Once Daily For 7 Days 8)  Lipitor 10 Mg Tabs (Atorvastatin Calcium) .... Generic  - 1 By Mouth Once Daily  Current Medications (verified): 1)  Proair Hfa 108 (90 Base) Mcg/act Aers (Albuterol Sulfate) .... 2 Puffs Four Times Per Day As Needed For Shortness of Breath 2)  Symbicort 160-4.5 Mcg/act Aero (Budesonide-Formoterol Fumarate) .... 2 Puffs Two Times A Day 3)  Aspir-Low 81 Mg Tbec (Aspirin) .[name].. 1po Once Daily 4)  Bupropion Hcl 150 Mg Xr24h-Tab (Bupropion Hcl) .[name].. 1po Once Daily 5)  Fexofenadine Hcl 180 Mg Tabs (Fexofenadine Hcl) .[name].. 1po Once Daily 6)  Metoprolol Succinate 25 Mg Xr24h-Tab (Metoprolol Succinate) .[name].. 1 By Mouth Once Daily 7)  Azithromycin 250 Mg Tabs (Azithromycin) .... 2po Qd  For 1 Day, Then 1po Qd For 4days, Then Stop 8)  Lipitor 10 Mg Tabs (Atorvastatin Calcium) .... Generic  - 1 By Mouth Once Daily  Allergies (verified): 1)  ! Simvastatin 2)  ! 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  , mild ill  Head:  normocephalic and atraumatic.   Eyes:  vision grossly intact, pupils equal, and pupils round.   Ears:  bilat tm's red, sinus nontender Nose:  nasal dischargemucosal pallor and mucosal edema.   Mouth:  pharyngeal erythema and fair dentition.   Neck:  supple and no masses.   Lungs:  normal respiratory effort, R decreased breath sounds, and L decreased breath sounds without wheeze or rales Heart:  normal rate and regular rhythm.   Msk:  no joint tenderness and no joint swelling. , no spine tender or paravertebral tender  Extremities:  no edema, no erythema  Neurologic:  gait normal and finger-to-nose normal.   Psych:  not depressed appearing and slightly anxious.     Impression & Recommendations:  Problem # 1:  URI (ICD-465.9)  Her updated medication list for this problem includes:    Aspir-low 81 Mg Tbec (Aspirin) .[name]... 1po once daily    Fexofenadine Hcl 180 Mg Tabs (Fexofenadine hcl) .[name]... 1po once daily treat as above, f/u any worsening signs or symptoms  - for zpack x 1  Problem # 2:  TINNITUS (ICD-388.30) mild, left > right, likely due to above +/- allerges - tx with mucinex/allegra as needed   Problem # 3:  OSTEOPENIA (ICD-733.90)  d/w pt, recent dxa reviewed with pt;  c/w osteopenia, mild only - Continue all previous medications as before this visit , no indication for fosamax or prolia or other at this time;  f/u dxa in 2 yrs  Discussed medication use, applications of heat or ice, and exercises.   Problem # 4:  ASTHMA (ICD-493.90)  Her updated medication list for this problem includes:    Proair Hfa 108 (90 Base) Mcg/act Aers (Albuterol sulfate) .[name]... 2 puffs four times per day as needed for  shortness of breath    Symbicort 160-4.5 Mcg/act Aero (Budesonide-formoterol fumarate) .[name]... 2 puffs two times a day o/w stable overall by hx and exam, ok to continue meds/tx as is , though has allergic type worsening to certain environ such as the detergent aisle at the grocery  Complete Medication List: 1)  Proair Hfa 108 (90 Base) Mcg/act Aers (Albuterol sulfate) .... 2 puffs four times per day as needed for shortness of breath 2)  Symbicort 160-4.5 Mcg/act Aero (Budesonide-formoterol fumarate) .... 2 puffs two times a day 3)  Aspir-low 81 Mg Tbec (Aspirin) .[name].. 1po once daily 4)  Bupropion Hcl 150 Mg Xr24h-tab (Bupropion hcl) .[name].. 1po once daily 5)  Fexofenadine Hcl 180 Mg Tabs (Fexofenadine hcl) .[name].. 1po once daily 6)  Metoprolol Succinate 25 Mg Xr24h-tab (Metoprolol  succinate) .[name].. 1 by mouth once daily 7)  Azithromycin 250 Mg Tabs (Azithromycin) .... 2po qd for 1 day, then 1po qd for 4days, then stop 8)  Lipitor 10 Mg Tabs (Atorvastatin calcium) .... Generic  - 1 by mouth once daily  Patient Instructions: 1)  Please take all new medications as prescribed - the antibiotic 2)  Continue all previous medications as before this visit , including the generic allegra as needed  3)  You can also use Mucinex OTC or it's generic for congestion 4)  You can sleep with the radio on at night if you have trouble getting to sleep 5)  Please schedule a follow-up appointment in 5 months, or sooner if needed Prescriptions: FEXOFENADINE HCL 180 MG TABS (FEXOFENADINE HCL) 1po once daily  #180 x 3   Entered and Authorized by:   Corwin Levins MD   Signed by:   Corwin Levins MD on 09/10/2010   Method used:   Print then Give to Patient   RxID:   [id number] AZITHROMYCIN 250 MG TABS (AZITHROMYCIN) 2po qd for 1 day, then 1po qd for 4days, then stop  #6 x 1   Entered and Authorized by:   Corwin Levins MD   Signed by:   Corwin Levins MD on 09/10/2010   Method used:   Print then Give to Patient   RxID:    [id number]    Orders Added: 1)  Est. Patient Level IV [01027]

## 2010-10-31 ENCOUNTER — Ambulatory Visit (INDEPENDENT_AMBULATORY_CARE_PROVIDER_SITE_OTHER)
Admission: RE | Admit: 2010-10-31 | Discharge: 2010-10-31 | Disposition: A | Discharge status: Home / Self Care | Payer: Medicare Other | Source: Ambulatory Visit | Attending: Internal Medicine | Admitting: Internal Medicine

## 2010-10-31 ENCOUNTER — Encounter: Payer: Self-pay | Admitting: Internal Medicine

## 2010-10-31 ENCOUNTER — Ambulatory Visit (INDEPENDENT_AMBULATORY_CARE_PROVIDER_SITE_OTHER): Payer: Medicare Other | Admitting: Internal Medicine

## 2010-10-31 DIAGNOSIS — R079 Chest pain, unspecified: Secondary | ICD-10-CM | POA: Insufficient documentation

## 2010-10-31 DIAGNOSIS — E785 Hyperlipidemia, unspecified: Secondary | ICD-10-CM

## 2010-10-31 DIAGNOSIS — Z0001 Encounter for general adult medical examination with abnormal findings: Secondary | ICD-10-CM | POA: Insufficient documentation

## 2010-10-31 DIAGNOSIS — J441 Chronic obstructive pulmonary disease with (acute) exacerbation: Secondary | ICD-10-CM

## 2010-10-31 DIAGNOSIS — F411 Generalized anxiety disorder: Secondary | ICD-10-CM

## 2010-10-31 DIAGNOSIS — Z0000 Encounter for general adult medical examination without abnormal findings: Secondary | ICD-10-CM

## 2010-10-31 MED ORDER — PREDNISONE 10 MG PO TABS: 10.0000 mg | ORAL_TABLET | Freq: Every day | ORAL | Status: DC

## 2010-10-31 MED ORDER — METHYLPREDNISOLONE ACETATE 80 MG/ML IJ SUSP
120.0000 mg | Freq: Once | INTRAMUSCULAR | Status: AC
  Administered 2010-10-31: 120 mg via INTRAMUSCULAR

## 2010-10-31 MED ORDER — PREDNISONE 10 MG PO TABS: 10.0000 mg | ORAL_TABLET | Freq: Every day | ORAL | Status: AC

## 2010-10-31 MED ORDER — ALBUTEROL SULFATE HFA 108 (90 BASE) MCG/ACT IN AERS: 2.0000 | INHALATION_SPRAY | RESPIRATORY_TRACT | Status: DC | PRN

## 2010-10-31 NOTE — Assessment & Plan Note (Signed)
stable overall by hx and exam, most recent lab reviewed with pt, and pt to continue medical treatment as before 

## 2010-10-31 NOTE — Progress Notes (Signed)
Subjective:    Patient ID: Michele Martin, female    DOB: March 01, 1946, 65 y.o.   MRN: [id number]  HPI  "I'm here for a tuneup";  Overall doing ok, but c/o 2wks chest congestion, and prod cough clearish sputum, no fever, but has had wheezing, sob/doe imporved with proair but now out of her prescription;  Overall symptoms worse to going outside. Overall good compliance with treatment, and good medicine tolerability. Including the symibicort.   Pt denies fever, wt loss, night sweats, loss of appetite, or other constitutional symptoms  Pt denies orthopnea, PND, increased LE swelling, palpitations, dizziness or syncope, but has had intermittent heaviness of the chest mid sternal ., without radiation, n/v, diaphrosesis, palp, syncope, but some worse with exertion or if tries to lift heavy things she has at home close to 50 lbs ("when I want it done I just do it").  .Pt denies new neurological symptoms such as new headache, or facial or extremity weakness or numbness.   Pt denies polydipsia, polyuria.  Symptoms not better with mucinex, nothing else makes it worse.   Last stress test yrs ago.  Pt states overall good compliance with meds, trying to follow lower cholesterol, wt overall stable.  Denies worsening depressive symptoms, suicidal ideation, or panic, though has ongoing anxiety, not increased recently.     Pt denies fever, wt loss, night sweats, loss of appetite, or other constitutional symptoms.    Past Medical History  Diagnosis Date  . HYPERLIPIDEMIA 09/19/2009  . ANXIETY 04/15/2010  . DEPRESSION 09/19/2009  . Acute angle-closure glaucoma 02/14/2010  . ALLERGIC RHINITIS 09/19/2009  . CHRONIC OBSTRUCTIVE PULMONARY DISEASE, ACUTE EXACERBATION 12/19/2009  . ASTHMA 09/19/2009  . COPD 06/27/2009  . BACK PAIN 02/14/2010  . TREMOR 02/14/2010  . HOARSENESS 10/17/2009  . Wheezing 06/27/2009  . MENOPAUSAL DISORDER 08/20/2010  . TINNITUS 09/10/2010  . URI 09/10/2010  . OSTEOPENIA 09/10/2010   Past Surgical History   Procedure Date  . Normal heart cath 2001    Dr. Eden Emms  . Abdominal hysterectomy   . S/p laser tx for glaucoma     no vision loss    reports that she has quit smoking. She does not have any smokeless tobacco history on file. She reports that she does not drink alcohol or use illicit drugs. family history is not on file. Allergies  Allergen Reactions  . Pravastatin     REACTION: itch and leg cramp  . Simvastatin     REACTION: itch and leg cramps   Current Outpatient Prescriptions on File Prior to Visit  Medication Sig Dispense Refill  . aspirin 81 MG EC tablet Take 81 mg by mouth daily.        [name] azithromycin (ZITHROMAX) 250 MG tablet Take 2 tablets by mouth on day 1, followed by 1 tablet by mouth daily for 4 days. 2 by mouth for 1 day, then 1 by mouth every day for 4 days, then stop       . budesonide-formoterol (SYMBICORT) 160-4.5 MCG/ACT inhaler Inhale 2 puffs into the lungs 2 (two) times daily.        [name] buPROPion (WELLBUTRIN XL) 150 MG 24 hr tablet Take 150 mg by mouth daily.        . fexofenadine (ALLEGRA) 180 MG tablet Take 180 mg by mouth daily.        [name] DISCONTD: albuterol (PROAIR HFA) 108 (90 BASE) MCG/ACT inhaler Inhale 2 puffs into the lungs every 4 (four) hours as needed. For shortness of  breath       . atorvastatin (LIPITOR) 10 MG tablet Take 10 mg by mouth daily.        . metoprolol succinate (TOPROL-XL) 25 MG 24 hr tablet Take 25 mg by mouth daily.         No current facility-administered medications on file prior to visit.   Review of Systems Review of Systems  Constitutional: Negative for diaphoresis and unexpected weight change.  HENT: Negative for drooling and tinnitus.   Eyes: Negative for photophobia and visual disturbance.  Respiratory: Negative for choking and stridor.   Gastrointestinal: Negative for vomiting and blood in stool.  Genitourinary: Negative for hematuria and decreased urine volume.  Musculoskeletal: Negative for gait problem.  Skin: Negative  for color change and wound.  Neurological: Negative for tremors and numbness.  Psychiatric/Behavioral: Negative for decreased concentration. The patient is not hyperactive.       Objective:   Physical ExamBP 104/64  Pulse 62  Temp(Src) 98.1 F (36.7 C) (Oral)  Ht 5\' 2"  (1.575 m)  Wt 111 lb 4 oz (50.463 kg)  BMI 20.35 kg/m2  SpO2 94% Physical Exam  VS noted Constitutional: Pt appears well-developed and well-nourished.  HENT: Head: Normocephalic.  Right Ear: External ear normal.  Left Ear: External ear normal.  Eyes: Conjunctivae and EOM are normal. Pupils are equal, round, and reactive to light.  Neck: Normal range of motion. Neck supple.  Cardiovascular: Normal rate and regular rhythm.   Pulmonary/Chest: Effort increased and breath sounds decreased bilat with mild wheezing Abd:  Soft, NT, non-distended, + BS Neurological: Pt is alert. No cranial nerve deficit.  Skin: Skin is warm. No erythema.  Psychiatric: Pt behavior is normal. Thought content normal. 1+ anxious         Assessment & Pl

## 2010-10-31 NOTE — Assessment & Plan Note (Signed)
stable overall by hx and exam, most recent lab reviewed with pt, and pt to continue medical treatment as before   Lab Results  Component Value Date   LDLCALC 99 09/16/2010

## 2010-10-31 NOTE — Progress Notes (Signed)
Quick Note:  Voice message left on PhoneTree system - lab is negative, normal or otherwise stable, pt to continue same tx ______ 

## 2010-10-31 NOTE — Assessment & Plan Note (Signed)
Atypical, for ECG, CXR, and stress test to further evaluate, further treatment pending results

## 2010-10-31 NOTE — Assessment & Plan Note (Signed)
Mild to mod, no evidence infectious related, prob allergic related/asthma exac;  For depomedrol IM today, predpack for home, delsym prn cough and f/u any worsening symtpoms

## 2010-10-31 NOTE — Patient Instructions (Signed)
You had the steroid shot today Take all new medications as prescribed - the prednisone Continue all other medications as before - the proair and symbicort All prescriptions were sent to the pharmacy Your EKG was ok today Please go to XRAY in the Basement for the x-ray test You will be contacted regarding the referral for: stress test Please return in Jan 2012 for your yearly visit, or sooner if needed, with Lab testing done 3-5 days before

## 2010-12-03 ENCOUNTER — Encounter: Payer: Self-pay | Admitting: Internal Medicine

## 2010-12-03 ENCOUNTER — Ambulatory Visit (INDEPENDENT_AMBULATORY_CARE_PROVIDER_SITE_OTHER): Payer: Medicare Other | Admitting: Internal Medicine

## 2010-12-03 VITALS — BP 120/78 | HR 90 | Temp 98.4°F | Ht 62.0 in | Wt 111.5 lb

## 2010-12-03 DIAGNOSIS — E785 Hyperlipidemia, unspecified: Secondary | ICD-10-CM

## 2010-12-03 DIAGNOSIS — R5381 Other malaise: Secondary | ICD-10-CM

## 2010-12-03 DIAGNOSIS — J209 Acute bronchitis, unspecified: Secondary | ICD-10-CM

## 2010-12-03 DIAGNOSIS — J441 Chronic obstructive pulmonary disease with (acute) exacerbation: Secondary | ICD-10-CM

## 2010-12-03 DIAGNOSIS — Z0000 Encounter for general adult medical examination without abnormal findings: Secondary | ICD-10-CM

## 2010-12-03 DIAGNOSIS — R5383 Other fatigue: Secondary | ICD-10-CM

## 2010-12-03 MED ORDER — PREDNISONE 10 MG PO TABS: 10.0000 mg | ORAL_TABLET | Freq: Every day | ORAL | Status: DC

## 2010-12-03 MED ORDER — CEPHALEXIN 500 MG PO CAPS: 500.0000 mg | ORAL_CAPSULE | Freq: Four times a day (QID) | ORAL | Status: DC

## 2010-12-03 NOTE — Assessment & Plan Note (Signed)
New, I suspect related to above, exam o/w benign, most recent labs reivewed with pt;   to f/u any worsening symptoms or concerns  Lab Results  Component Value Date   WBC 5.5 08/15/2010   HGB 15.1* 08/15/2010   HCT 43.4 08/15/2010   PLT 232.0 08/15/2010   CHOL 191 09/16/2010   TRIG 68.0 09/16/2010   HDL 78.90 09/16/2010   LDLDIRECT 196.0 08/15/2010   ALT 21 09/16/2010   AST 23 09/16/2010   NA 138 08/15/2010   K 4.1 08/15/2010   CL 99 08/15/2010   CREATININE 0.6 08/15/2010   BUN 17 08/15/2010   CO2 32 08/15/2010   TSH 0.55 08/15/2010

## 2010-12-03 NOTE — Progress Notes (Signed)
Subjective:    Patient ID: Michele Martin, female    DOB: [birth year], 65 y.o.   MRN: [id number]  HPI  Here with onset x 2 wks severe fatigue, at one point though she had URI type symptoms at the outset, but cough not worse than usual, no ST but not SOB/DOE more than usual;  Pt denies chest pain, increased sob or doe, wheezing, orthopnea, PND, increased LE swelling, palpitations, dizziness or syncope.  Pt denies new neurological symptoms such as new headache, or facial or extremity weakness or numbness.   Pt denies polydipsia, polyuria,  Pt states overall good compliance with meds.   Pt denies fever, wt loss, night sweats, loss of appetite, or other constitutional symptoms  Denies hyper or hypo thyroid symptoms such as voice, skin or hair change.  Pt denies fever, wt loss, night sweats, loss of appetite, or other constitutional symptoms  Denies worsening depressive symptoms, suicidal ideation, or panic, though has ongoing anxiety, not increased recently.   No overt bleeding or bruising .  No snoring at night.  No sick contacts she is aware of.   Denies urinary symptoms such as dysuria, frequency, urgency,or hematuria.  Past Medical History  Diagnosis Date  . HYPERLIPIDEMIA 09/19/2009  . ANXIETY 04/15/2010  . DEPRESSION 09/19/2009  . Acute angle-closure glaucoma 02/14/2010  . ALLERGIC RHINITIS 09/19/2009  . CHRONIC OBSTRUCTIVE PULMONARY DISEASE, ACUTE EXACERBATION 12/19/2009  . ASTHMA 09/19/2009  . COPD 06/27/2009  . BACK PAIN 02/14/2010  . TREMOR 02/14/2010  . HOARSENESS 10/17/2009  . Wheezing 06/27/2009  . MENOPAUSAL DISORDER 08/20/2010  . TINNITUS 09/10/2010  . URI 09/10/2010  . OSTEOPENIA 09/10/2010   Past Surgical History  Procedure Date  . Normal heart cath 2001    Dr. Eden Emms  . Abdominal hysterectomy   . S/p laser tx for glaucoma     no vision loss    reports that she has quit smoking. She does not have any smokeless tobacco history on file. She reports that she does not drink alcohol or use  illicit drugs. family history includes Dementia in her mother. Allergies  Allergen Reactions  . Pravastatin     REACTION: itch and leg cramp  . Simvastatin     REACTION: itch and leg cramps   Current Outpatient Prescriptions on File Prior to Visit  Medication Sig Dispense Refill  . albuterol (PROAIR HFA) 108 (90 BASE) MCG/ACT inhaler Inhale 2 puffs into the lungs every 4 (four) hours as needed. For shortness of breath  1 Inhaler  11  . aspirin 81 MG EC tablet Take 81 mg by mouth daily.        [name] atorvastatin (LIPITOR) 10 MG tablet Take 10 mg by mouth daily.        . budesonide-formoterol (SYMBICORT) 160-4.5 MCG/ACT inhaler Inhale 2 puffs into the lungs 2 (two) times daily.        [name] buPROPion (WELLBUTRIN XL) 150 MG 24 hr tablet Take 150 mg by mouth daily.        . fexofenadine (ALLEGRA) 180 MG tablet Take 180 mg by mouth daily.        . metoprolol succinate (TOPROL-XL) 25 MG 24 hr tablet Take 25 mg by mouth daily.        [name] azithromycin (ZITHROMAX) 250 MG tablet Take 2 tablets by mouth on day 1, followed by 1 tablet by mouth daily for 4 days. 2 by mouth for 1 day, then 1 by mouth every day for 4 days, then stop  Review of SystemsAll otherwise neg per pt     Objective:   Physical Exam BP 120/78  Pulse 90  Temp(Src) 98.4 F (36.9 C) (Oral)  Ht 5\' 2"  (1.575 m)  Wt 111 lb 8 oz (50.576 kg)  BMI 20.39 kg/m2  SpO2 92% Physical Exam  VS noted, fatigued, mild ill appearing Constitutional: Pt appears well-developed and well-nourished.  HENT: Head: Normocephalic.  Right Ear: External ear normal.  Left Ear: External ear normal.  Eyes: Conjunctivae and EOM are normal. Pupils are equal, round, and reactive to light.  Neck: Normal range of motion. Neck supple.  Cardiovascular: Normal rate and regular rhythm.   Pulmonary/Chest: Effort normal and breath sounds decreased bilat with mild wheezing.  Abd:  Soft, NT, non-distended, + BS Neurological: Pt is alert. No cranial nerve deficit.    Skin: Skin is warm. No erythema.  Psychiatric: Pt behavior is normal. Thought content normal. not overly nervous or depressed apppearing        Assessment & Plan:

## 2010-12-03 NOTE — Assessment & Plan Note (Signed)
Much improved, d/w pt, The current medical regimen is effective;  continue present plan and medications. ;   Lab Results  Component Value Date   LDLCALC 99 09/16/2010

## 2010-12-03 NOTE — Patient Instructions (Addendum)
Take all new medications as prescribed Continue all other medications as before Please return in jan 2013 for your yearly visit, or sooner if needed, with Lab testing done 3-5 days before

## 2010-12-03 NOTE — Assessment & Plan Note (Signed)
Mild, for antibx course,  to f/u any worsening symptoms or concerns 

## 2010-12-03 NOTE — Assessment & Plan Note (Signed)
Mild, for prepack for home,  to f/u any worsening symptoms or concerns

## 2010-12-08 ENCOUNTER — Encounter: Payer: Self-pay | Admitting: Internal Medicine

## 2010-12-13 ENCOUNTER — Ambulatory Visit (INDEPENDENT_AMBULATORY_CARE_PROVIDER_SITE_OTHER)
Admission: RE | Admit: 2010-12-13 | Discharge: 2010-12-13 | Disposition: A | Discharge status: Home / Self Care | Payer: Medicare Other | Source: Ambulatory Visit | Attending: Internal Medicine | Admitting: Internal Medicine

## 2010-12-13 ENCOUNTER — Ambulatory Visit (INDEPENDENT_AMBULATORY_CARE_PROVIDER_SITE_OTHER): Payer: Medicare Other | Admitting: Internal Medicine

## 2010-12-13 ENCOUNTER — Encounter: Payer: Self-pay | Admitting: Internal Medicine

## 2010-12-13 DIAGNOSIS — F329 Major depressive disorder, single episode, unspecified: Secondary | ICD-10-CM

## 2010-12-13 DIAGNOSIS — J441 Chronic obstructive pulmonary disease with (acute) exacerbation: Secondary | ICD-10-CM

## 2010-12-13 DIAGNOSIS — J209 Acute bronchitis, unspecified: Secondary | ICD-10-CM

## 2010-12-13 MED ORDER — CEPHALEXIN 500 MG PO CAPS: 500.0000 mg | ORAL_CAPSULE | Freq: Four times a day (QID) | ORAL | Status: AC

## 2010-12-13 MED ORDER — METHYLPREDNISOLONE ACETATE 80 MG/ML IJ SUSP
120.0000 mg | Freq: Once | INTRAMUSCULAR | Status: AC
  Administered 2010-12-13: 120 mg via INTRAMUSCULAR

## 2010-12-13 MED ORDER — PREDNISONE 10 MG PO TABS: 10.0000 mg | ORAL_TABLET | Freq: Every day | ORAL | Status: AC

## 2010-12-13 MED ORDER — IPRATROPIUM-ALBUTEROL 0.5-2.5 (3) MG/3ML IN SOLN: 3.0000 mL | Freq: Four times a day (QID) | RESPIRATORY_TRACT | Status: DC | PRN

## 2010-12-13 NOTE — Assessment & Plan Note (Signed)
Recurrent it seems for unclear reasons, for antibx course, and cxr today, consider pulm eval

## 2010-12-13 NOTE — Patient Instructions (Signed)
You had the steroid shot today Take all new medications as prescribed, including the new duonebs for home nebulizer use Continue all other medications as before Please go to XRAY in the Basement for the x-ray test Please call the phone number [phone] (the PhoneTree System) for results of testing in 2-3 days;  When calling, simply dial the number, and when prompted enter the MRN number above (the Medical Record Number) and the # key, then the message should start. Please call or return if symptoms worsen again, for possible pulmonary referral

## 2010-12-13 NOTE — Progress Notes (Signed)
Quick Note:  Voice message left on PhoneTree system - lab is negative, normal or otherwise stable, pt to continue same tx ______ 

## 2010-12-13 NOTE — Assessment & Plan Note (Signed)
Recurrent, for predpack higher strength, cough med, depomedrol and add duonebs today

## 2010-12-13 NOTE — Cardiovascular Report (Signed)
[place]  Patient:    Michele Martin, Michele Martin                 MRN: [id number] Adm. Date:  [id number] Disc. Date: [id number] Attending:  Colon Branch                        Cardiac Catheterization  PROCEDURE:  Coronary arteriography.  INDICATIONS:  Chest pain, Cardiolite study suggesting inferior wall ischemia.  ANGIOGRAPHIC DATA: 1. The left main coronary was normal. 2. The left anterior descending artery was normal. 3. The circumflex coronary artery was normal. 4. The right coronary artery was dominant.  It was normal.  RAO ventriculography was normal.  Ejection fraction was in excess of 70%. There was no significant MR.  HEMODYNAMIC DATA:  Aortic pressure was 150/80.  LV pressure was 152/22. There was no significant gradient across the aortic valve.  COMMENTS:  The right femoral artery was closed using the Perclose device with good hemostasis.  IMPRESSION:  The patients chest pain would appear to be noncardiac in etiology.  Her Cardiolite study was a false positive.  She will continue risk factor modification including smoking cessation, Lipitor for her hypercholesterolemia, and an aspirin a day.  She will have her groin checked in 48 hours and then I will see her in six months. DD:  12/17/99 TD:  12/20/99 Job: [id number]

## 2010-12-15 ENCOUNTER — Encounter: Payer: Self-pay | Admitting: Internal Medicine

## 2010-12-15 NOTE — Assessment & Plan Note (Signed)
stable overall by hx and exam, most recent lab reviewed with pt, and pt to continue medical treatment as before Lab Results  Component Value Date   WBC 5.5 08/15/2010   HGB 15.1* 08/15/2010   HCT 43.4 08/15/2010   PLT 232.0 08/15/2010   CHOL 191 09/16/2010   TRIG 68.0 09/16/2010   HDL 78.90 09/16/2010   LDLDIRECT 196.0 08/15/2010   ALT 21 09/16/2010   AST 23 09/16/2010   NA 138 08/15/2010   K 4.1 08/15/2010   CL 99 08/15/2010   CREATININE 0.6 08/15/2010   BUN 17 08/15/2010   CO2 32 08/15/2010   TSH 0.55 08/15/2010

## 2010-12-15 NOTE — Progress Notes (Signed)
  Subjective:    Patient ID: Michele Martin, female    DOB: [birth year], 65 y.o.   MRN: [id number]  HPI Here with acute onset mild to mod 2-3 days ST, HA, general weakness and malaise, with prod cough greenish sputum, but Pt denies chest pain,  orthopnea, PND, increased LE swelling, palpitations, dizziness or syncope, with recurrent worsening sob/doe,, wheezing.  Pt denies new neurological symptoms such as new headache, or facial or extremity weakness or numbness   Pt denies polydipsia, polyuria.  Denies worsening depressive symptoms, suicidal ideation, or panic, though has ongoing anxiety. Past Medical History  Diagnosis Date  . HYPERLIPIDEMIA 09/19/2009  . ANXIETY 04/15/2010  . DEPRESSION 09/19/2009  . Acute angle-closure glaucoma 02/14/2010  . ALLERGIC RHINITIS 09/19/2009  . CHRONIC OBSTRUCTIVE PULMONARY DISEASE, ACUTE EXACERBATION 12/19/2009  . ASTHMA 09/19/2009  . COPD 06/27/2009  . BACK PAIN 02/14/2010  . TREMOR 02/14/2010  . HOARSENESS 10/17/2009  . Wheezing 06/27/2009  . MENOPAUSAL DISORDER 08/20/2010  . TINNITUS 09/10/2010  . URI 09/10/2010  . OSTEOPENIA 09/10/2010   Past Surgical History  Procedure Date  . Normal heart cath 2001    Dr. Eden Emms  . Abdominal hysterectomy   . S/p laser tx for glaucoma     no vision loss    reports that she has quit smoking. She does not have any smokeless tobacco history on file. She reports that she does not drink alcohol or use illicit drugs. family history includes Dementia in her mother. Allergies  Allergen Reactions  . Pravastatin     REACTION: itch and leg cramp  . Simvastatin     REACTION: itch and leg cramps   Current Outpatient Prescriptions on File Prior to Visit  Medication Sig Dispense Refill  . albuterol (PROAIR HFA) 108 (90 BASE) MCG/ACT inhaler Inhale 2 puffs into the lungs every 4 (four) hours as needed. For shortness of breath  1 Inhaler  11  . aspirin 81 MG EC tablet Take 81 mg by mouth daily.        [name] atorvastatin (LIPITOR) 10 MG  tablet Take 10 mg by mouth daily.        . budesonide-formoterol (SYMBICORT) 160-4.5 MCG/ACT inhaler Inhale 2 puffs into the lungs 2 (two) times daily.        . fexofenadine (ALLEGRA) 180 MG tablet Take 180 mg by mouth daily.         Review of Systems All otherwise neg per pt     Objective:   Physical Exam BP 112/80  Pulse 82  Temp(Src) 98.9 F (37.2 C) (Oral)  Ht 5\' 2"  (1.575 m)  Wt 111 lb 6 oz (50.519 kg)  BMI 20.37 kg/m2  SpO2 95% Physical Exam  VS noted Constitutional: Pt appears well-developed and well-nourished.  HENT: Head: Normocephalic.  Right Ear: External ear normal.  Left Ear: External ear normal.  Eyes: Conjunctivae and EOM are normal. Pupils are equal, round, and reactive to light.  Neck: Normal range of motion. Neck supple.  Cardiovascular: Normal rate and regular rhythm.   Pulmonary/Chest: Effort normal and breath sounds decreased with bilat wheezes Abd:  Soft, NT, non-distended, + BS Neurological: Pt is alert. No cranial nerve deficit.  Skin: Skin is warm. No erythema.  Psychiatric: Pt behavior is normal. Thought content normal. Not depressed appearing        Assessment & Plan:

## 2010-12-17 ENCOUNTER — Telehealth: Payer: Self-pay

## 2010-12-17 MED ORDER — COMPRESSOR/NEBULIZER MISC: 1.0000 | Freq: Four times a day (QID) | Status: DC | PRN

## 2010-12-17 NOTE — Telephone Encounter (Signed)
Pt called requesting Rx for Nebulizer.

## 2010-12-17 NOTE — Telephone Encounter (Signed)
Done per emr 

## 2010-12-19 ENCOUNTER — Other Ambulatory Visit: Payer: Self-pay

## 2010-12-19 MED ORDER — BUDESONIDE-FORMOTEROL FUMARATE 160-4.5 MCG/ACT IN AERO: 2.0000 | INHALATION_SPRAY | Freq: Two times a day (BID) | RESPIRATORY_TRACT | Status: DC

## 2011-01-10 ENCOUNTER — Other Ambulatory Visit: Payer: Self-pay | Admitting: Internal Medicine

## 2011-01-10 DIAGNOSIS — N63 Unspecified lump in unspecified breast: Secondary | ICD-10-CM

## 2011-01-13 ENCOUNTER — Encounter: Payer: Self-pay | Admitting: Internal Medicine

## 2011-01-13 ENCOUNTER — Ambulatory Visit (INDEPENDENT_AMBULATORY_CARE_PROVIDER_SITE_OTHER): Payer: Medicare Other | Admitting: Internal Medicine

## 2011-01-13 VITALS — BP 130/72 | HR 90 | Temp 99.2°F | Ht 62.0 in | Wt 116.1 lb

## 2011-01-13 DIAGNOSIS — F3289 Other specified depressive episodes: Secondary | ICD-10-CM

## 2011-01-13 DIAGNOSIS — R609 Edema, unspecified: Secondary | ICD-10-CM

## 2011-01-13 DIAGNOSIS — M25511 Pain in right shoulder: Secondary | ICD-10-CM

## 2011-01-13 DIAGNOSIS — F329 Major depressive disorder, single episode, unspecified: Secondary | ICD-10-CM

## 2011-01-13 DIAGNOSIS — J4489 Other specified chronic obstructive pulmonary disease: Secondary | ICD-10-CM

## 2011-01-13 DIAGNOSIS — J449 Chronic obstructive pulmonary disease, unspecified: Secondary | ICD-10-CM

## 2011-01-13 DIAGNOSIS — M25519 Pain in unspecified shoulder: Secondary | ICD-10-CM

## 2011-01-13 MED ORDER — ESCITALOPRAM OXALATE 10 MG PO TABS: 10.0000 mg | ORAL_TABLET | Freq: Every day | ORAL | Status: DC

## 2011-01-13 MED ORDER — HYDROCHLOROTHIAZIDE 12.5 MG PO CAPS: 12.5000 mg | ORAL_CAPSULE | Freq: Every day | ORAL | Status: DC

## 2011-01-13 MED ORDER — MONTELUKAST SODIUM 10 MG PO TABS: 10.0000 mg | ORAL_TABLET | Freq: Every day | ORAL | Status: DC

## 2011-01-13 NOTE — Assessment & Plan Note (Signed)
New onset, recent labs since jan without obvious cause;  For hctz 12.5, and echo - r/o diast dysfxn and/or pulm HTN

## 2011-01-13 NOTE — Progress Notes (Signed)
Subjective:    Patient ID: Michele Martin, female    DOB: April 26, 1946, 65 y.o.   MRN: [id number]  HPI  Here to f/u, Pt denies chest pain, increased sob or doe, wheezing, orthopnea, PND,  palpitations, dizziness or syncope, though she has ongoing chest congestion and hoarseness, with intermittent wheezing.  Has devleoped4 wks mild LE edema that goes away at night, but recurs by the end of the next day.  ALsowith right shoulder pain for 4 wks, worse to abduct, no obvious trauma, just seemed to start on its own, but does still have FROM, and no prior hx.  Left shoulder ok.  Does also have worsening depressive symptoms for the past month, but nosuicidal ideation, or panic, though has ongoing anxiety, not increased recently. Was recently dx with glaucoma to both eyes, which has really upset her. Past Medical History  Diagnosis Date  . HYPERLIPIDEMIA 09/19/2009  . ANXIETY 04/15/2010  . DEPRESSION 09/19/2009  . Acute angle-closure glaucoma 02/14/2010  . ALLERGIC RHINITIS 09/19/2009  . CHRONIC OBSTRUCTIVE PULMONARY DISEASE, ACUTE EXACERBATION 12/19/2009  . ASTHMA 09/19/2009  . COPD 06/27/2009  . BACK PAIN 02/14/2010  . TREMOR 02/14/2010  . HOARSENESS 10/17/2009  . Wheezing 06/27/2009  . MENOPAUSAL DISORDER 08/20/2010  . TINNITUS 09/10/2010  . URI 09/10/2010  . OSTEOPENIA 09/10/2010   Past Surgical History  Procedure Date  . Normal heart cath 2001    Dr. Eden Emms  . Abdominal hysterectomy   . S/p laser tx for glaucoma     no vision loss    reports that she has quit smoking. She does not have any smokeless tobacco history on file. She reports that she does not drink alcohol or use illicit drugs. family history includes Dementia in her mother. Allergies  Allergen Reactions  . Pravastatin     REACTION: itch and leg cramp  . Simvastatin     REACTION: itch and leg cramps   Current Outpatient Prescriptions on File Prior to Visit  Medication Sig Dispense Refill  . albuterol (PROAIR HFA) 108 (90 BASE)  MCG/ACT inhaler Inhale 2 puffs into the lungs every 4 (four) hours as needed. For shortness of breath  1 Inhaler  11  . aspirin 81 MG EC tablet Take 81 mg by mouth daily.        [name] atorvastatin (LIPITOR) 10 MG tablet Take 10 mg by mouth daily.        . budesonide-formoterol (SYMBICORT) 160-4.5 MCG/ACT inhaler Inhale 2 puffs into the lungs 2 (two) times daily.  1 Inhaler  11  . fexofenadine (ALLEGRA) 180 MG tablet Take 180 mg by mouth daily.        [name] ipratropium-albuterol (DUONEB) 0.5-2.5 (3) MG/3ML SOLN Take 3 mLs by nebulization every 6 (six) hours as needed.  360 mL  3  . Nebulizers (COMPRESSOR/NEBULIZER) MISC Inhale 1 Device into the lungs 4 (four) times daily as needed.  1 each  0   Review of Systems Review of Systems  Constitutional: Negative for diaphoresis and unexpected weight change.  HENT: Negative for drooling and tinnitus.   Eyes: Negative for photophobia and visual disturbance.  Respiratory: Negative for choking and stridor.       Objective:   Physical Exam BP 130/72  Pulse 90  Temp(Src) 99.2 F (37.3 C) (Oral)  Ht 5\' 2"  (1.575 m)  Wt 116 lb 2 oz (52.674 kg)  BMI 21.24 kg/m2  SpO2 94% Physical Exam  VS noted, not ill appearing Constitutional: Pt appears well-developed and well-nourished.  HENT:  Head: Normocephalic.  Right Ear: External ear normal.  Left Ear: External ear normal.  Eyes: Conjunctivae and EOM are normal. Pupils are equal, round, and reactive to light.  Neck: Normal range of motion. Neck supple.  Cardiovascular: Normal rate and regular rhythm.   Pulmonary/Chest: Effort normal and breath sounds decresed bilat with mild wheeze instp.  Abd:  Soft, NT, non-distended, + BS Neurological: Pt is alert. No cranial nerve deficit.  Skin: Skin is warm. No erythema. trace edema pedal right > left Psychiatric: Thought content normal. depressed affect, 1+ nervous Tender right subacromial bursa area, but has o/w FROM       Assessment & Plan:

## 2011-01-13 NOTE — Assessment & Plan Note (Signed)
Mild , for lexapro 10 qd, verifed nonsuicidal,  to f/u any worsening symptoms or concerns

## 2011-01-13 NOTE — Assessment & Plan Note (Signed)
Exam c/w right bursitis/rot cuff tendonitis most likely, declines further eval or tx or referral at this time

## 2011-01-13 NOTE — Patient Instructions (Addendum)
Take all new medications as prescribed - the HCTZ 12.5 mg per day (fluid pill) and the generic for lexapro, and the singulair 10 mg  Continue all other medications as before You will be contacted regarding the referral for: Echocardiogram to check the heart Please call if you would like to be referred to orthopedic for the right shoulder Please keep your appointments with your specialists as you have planned - your opthamologist Please return as planned, or sooner if needed

## 2011-01-13 NOTE — Assessment & Plan Note (Signed)
Though little symtpoms, has some wheezing on exam, for singulair 10 qd

## 2011-01-15 ENCOUNTER — Ambulatory Visit
Admission: RE | Admit: 2011-01-15 | Discharge: 2011-01-15 | Disposition: A | Discharge status: Home / Self Care | Payer: Medicare Other | Source: Ambulatory Visit | Attending: Internal Medicine | Admitting: Internal Medicine

## 2011-01-15 DIAGNOSIS — N63 Unspecified lump in unspecified breast: Secondary | ICD-10-CM

## 2011-01-21 ENCOUNTER — Ambulatory Visit (HOSPITAL_COMMUNITY): Payer: Medicare Other | Attending: Internal Medicine | Admitting: Radiology

## 2011-01-21 DIAGNOSIS — J4489 Other specified chronic obstructive pulmonary disease: Secondary | ICD-10-CM | POA: Insufficient documentation

## 2011-01-21 DIAGNOSIS — I079 Rheumatic tricuspid valve disease, unspecified: Secondary | ICD-10-CM | POA: Insufficient documentation

## 2011-01-21 DIAGNOSIS — J449 Chronic obstructive pulmonary disease, unspecified: Secondary | ICD-10-CM | POA: Insufficient documentation

## 2011-01-21 DIAGNOSIS — R609 Edema, unspecified: Secondary | ICD-10-CM

## 2011-01-21 DIAGNOSIS — I059 Rheumatic mitral valve disease, unspecified: Secondary | ICD-10-CM | POA: Insufficient documentation

## 2011-01-21 DIAGNOSIS — M7989 Other specified soft tissue disorders: Secondary | ICD-10-CM | POA: Insufficient documentation

## 2011-01-21 DIAGNOSIS — E785 Hyperlipidemia, unspecified: Secondary | ICD-10-CM | POA: Insufficient documentation

## 2011-02-03 ENCOUNTER — Ambulatory Visit (INDEPENDENT_AMBULATORY_CARE_PROVIDER_SITE_OTHER): Payer: Medicare Other | Admitting: Internal Medicine

## 2011-02-03 ENCOUNTER — Encounter: Payer: Self-pay | Admitting: Internal Medicine

## 2011-02-03 VITALS — BP 112/70 | HR 73 | Temp 97.9°F | Ht 62.0 in | Wt 113.8 lb

## 2011-02-03 DIAGNOSIS — R600 Localized edema: Secondary | ICD-10-CM

## 2011-02-03 DIAGNOSIS — R609 Edema, unspecified: Secondary | ICD-10-CM

## 2011-02-03 DIAGNOSIS — J441 Chronic obstructive pulmonary disease with (acute) exacerbation: Secondary | ICD-10-CM

## 2011-02-03 DIAGNOSIS — F411 Generalized anxiety disorder: Secondary | ICD-10-CM

## 2011-02-03 MED ORDER — PREDNISONE 10 MG PO TABS: 10.0000 mg | ORAL_TABLET | Freq: Every day | ORAL | Status: AC

## 2011-02-03 MED ORDER — TIOTROPIUM BROMIDE MONOHYDRATE 18 MCG IN CAPS: 18.0000 ug | ORAL_CAPSULE | Freq: Every day | RESPIRATORY_TRACT | Status: DC

## 2011-02-03 MED ORDER — METHYLPREDNISOLONE ACETATE 80 MG/ML IJ SUSP
120.0000 mg | Freq: Once | INTRAMUSCULAR | Status: AC
  Administered 2011-02-03: 120 mg via INTRAMUSCULAR

## 2011-02-03 NOTE — Patient Instructions (Addendum)
You had the steroid shot today Take all new medications as prescribed  - the one month prednisone low dose, and the spiriva Continue all other medications as before Please call or return if not essentially back to "usual" in 1-2 wks as you may need to see pulmonary

## 2011-02-03 NOTE — Assessment & Plan Note (Signed)
Improved; Continue all other medications as before , echo reviewed with pt - essentially normal

## 2011-02-03 NOTE — Assessment & Plan Note (Signed)
Some mild improved so far but too early to fully assess, Continue all other medications as before,  to f/u any worsening symptoms or concerns

## 2011-02-03 NOTE — Assessment & Plan Note (Addendum)
Unable to tolerate the trial singulair per pt, and Much improved overall, but still persistent wheezing with marked reduction in ability to work in the yard, though can still ambulate, worse in this springtime - prob allergic related primarily, no evidence infection today;  For depomedrol IM today, low dose prednisone 10 qd for 1 mo, add spiriva, Continue all other medications as before, and hopefully stay well for the fall and winter,  to f/u any worsening symptoms or concerns, consider pulm if not improved or worsens again

## 2011-02-03 NOTE — Progress Notes (Signed)
Subjective:    Patient ID: Michele Martin, female    DOB: January 28, 1946, 65 y.o.   MRN: [id number]  HPI  Here to f/u - the low dose hctz has worked well with resolving the edema, and the lexapro has seemed to work somwheat so far in lessening depressive symptoms, signficinant anxiety remains, was not able to tolerate the singulair due to "popping veins" in the arms, so she stopped it.  A relative also had "trouble" with it, so she stopped after a few doses.  Today unfortunately with worsening wheezing, sob, doe but Pt denies chest pain, orthopnea, PND, increased LE swelling, palpitations, dizziness or syncope.  Pt denies new neurological symptoms such as new headache, or facial or extremity weakness or numbness   Pt denies polydipsia, polyuria.  Exercise tolerance has marrkedly diminished overall since onset allergy season mid march;  Before she was working in the yard, now  Not able, and even has some dyspnea at 100 ft walking in from the parking lot (but doesnot have to stop or sit);  Overall good compliance with treatment, and good medicine tolerability, including the symbicort though it is expensive.  Frustrated over her exercise intolerance.   Pt denies fever, wt loss, night sweats, loss of appetite, or other constitutional symptoms.  Has some chronic hoarseness and nonprod cough, and has declined referral to pulm in the past.  Past Medical History  Diagnosis Date  . HYPERLIPIDEMIA 09/19/2009  . ANXIETY 04/15/2010  . DEPRESSION 09/19/2009  . Acute angle-closure glaucoma 02/14/2010  . ALLERGIC RHINITIS 09/19/2009  . CHRONIC OBSTRUCTIVE PULMONARY DISEASE, ACUTE EXACERBATION 12/19/2009  . ASTHMA 09/19/2009  . COPD 06/27/2009  . BACK PAIN 02/14/2010  . TREMOR 02/14/2010  . HOARSENESS 10/17/2009  . Wheezing 06/27/2009  . MENOPAUSAL DISORDER 08/20/2010  . TINNITUS 09/10/2010  . URI 09/10/2010  . OSTEOPENIA 09/10/2010   Past Surgical History  Procedure Date  . Normal heart cath 2001    Dr. Eden Emms  .  Abdominal hysterectomy   . S/p laser tx for glaucoma     no vision loss    reports that she has quit smoking. She does not have any smokeless tobacco history on file. She reports that she does not drink alcohol or use illicit drugs. family history includes Dementia in her mother. Allergies  Allergen Reactions  . Pravastatin     REACTION: itch and leg cramp  . Simvastatin     REACTION: itch and leg cramps   Current Outpatient Prescriptions on File Prior to Visit  Medication Sig Dispense Refill  . albuterol (PROAIR HFA) 108 (90 BASE) MCG/ACT inhaler Inhale 2 puffs into the lungs every 4 (four) hours as needed. For shortness of breath  1 Inhaler  11  . aspirin 81 MG EC tablet Take 81 mg by mouth daily.        [name] atorvastatin (LIPITOR) 10 MG tablet Take 10 mg by mouth daily.        . budesonide-formoterol (SYMBICORT) 160-4.5 MCG/ACT inhaler Inhale 2 puffs into the lungs 2 (two) times daily.  1 Inhaler  11  . escitalopram (LEXAPRO) 10 MG tablet Take 1 tablet (10 mg total) by mouth daily.  30 tablet  11  . fexofenadine (ALLEGRA) 180 MG tablet Take 180 mg by mouth daily.        . hydrochlorothiazide (,MICROZIDE/HYDRODIURIL,) 12.5 MG capsule Take 1 capsule (12.5 mg total) by mouth daily.  90 capsule  3  . ipratropium-albuterol (DUONEB) 0.5-2.5 (3) MG/3ML SOLN Take 3 mLs by  nebulization every 6 (six) hours as needed.  360 mL  3  . Nebulizers (COMPRESSOR/NEBULIZER) MISC Inhale 1 Device into the lungs 4 (four) times daily as needed.  1 each  0  . DISCONTD: montelukast (SINGULAIR) 10 MG tablet Take 1 tablet (10 mg total) by mouth at bedtime.  30 tablet  11   No current facility-administered medications on file prior to visit.   Review of Systems Review of Systems  Constitutional: Negative for diaphoresis and unexpected weight change.  HENT: Negative for drooling and tinnitus.   Eyes: Negative for photophobia and visual disturbance.  Respiratory: Negative for choking and stridor.     Gastrointestinal: Negative for vomiting and blood in stool.  Genitourinary: Negative for hematuria and decreased urine volume.  Musculoskeletal: Negative for gait problem.  Skin: Negative for color change and wound.  Neurological: Negative for tremors and numbness.  Psychiatric/Behavioral: Negative for decreased concentration. The patient is not hyperactive.       Objective:   Physical Exam BP 112/70  Pulse 73  Temp(Src) 97.9 F (36.6 C) (Oral)  Ht 5\' 2"  (1.575 m)  Wt 113 lb 12 oz (51.597 kg)  BMI 20.81 kg/m2  SpO2 92% Physical Exam  VS noted, not ill appearing Constitutional: Pt appears well-developed and well-nourished.  HENT: Head: Normocephalic.  Right Ear: External ear normal.  Left Ear: External ear normal.  Bilat tm's ok, pharynx benign, No JVD Eyes: Conjunctivae and EOM are normal. Pupils are equal, round, and reactive to light.  Neck: Normal range of motion. Neck supple.  Cardiovascular: Normal rate and regular rhythm.   Pulmonary/Chest: Effort normal and breath sounds decreased bilat with mild increased wheeze compared to last visit  Abd:  Soft, NT, non-distended, + BS Neurological: Pt is alert. No cranial nerve deficit.  Skin: Skin is warm. No erythema.  Psychiatric: Pt behavior is normal. Thought content normal. 1-2+ nervous        Assessment & Plan:

## 2011-02-11 ENCOUNTER — Ambulatory Visit: Payer: Self-pay | Admitting: Internal Medicine

## 2011-03-24 ENCOUNTER — Ambulatory Visit (INDEPENDENT_AMBULATORY_CARE_PROVIDER_SITE_OTHER): Payer: Medicare Other | Admitting: Internal Medicine

## 2011-03-24 ENCOUNTER — Ambulatory Visit (INDEPENDENT_AMBULATORY_CARE_PROVIDER_SITE_OTHER)
Admission: RE | Admit: 2011-03-24 | Discharge: 2011-03-24 | Disposition: A | Discharge status: Home / Self Care | Payer: Medicare Other | Source: Ambulatory Visit | Attending: Internal Medicine | Admitting: Internal Medicine

## 2011-03-24 ENCOUNTER — Encounter: Payer: Self-pay | Admitting: Internal Medicine

## 2011-03-24 VITALS — BP 102/62 | HR 88 | Temp 98.3°F | Ht 62.0 in | Wt 113.2 lb

## 2011-03-24 DIAGNOSIS — J441 Chronic obstructive pulmonary disease with (acute) exacerbation: Secondary | ICD-10-CM

## 2011-03-24 DIAGNOSIS — J209 Acute bronchitis, unspecified: Secondary | ICD-10-CM

## 2011-03-24 DIAGNOSIS — F329 Major depressive disorder, single episode, unspecified: Secondary | ICD-10-CM

## 2011-03-24 DIAGNOSIS — R609 Edema, unspecified: Secondary | ICD-10-CM

## 2011-03-24 DIAGNOSIS — F3289 Other specified depressive episodes: Secondary | ICD-10-CM

## 2011-03-24 MED ORDER — HYDROCODONE-HOMATROPINE 5-1.5 MG/5ML PO SYRP: 5.0000 mL | ORAL_SOLUTION | Freq: Four times a day (QID) | ORAL | Status: AC | PRN

## 2011-03-24 MED ORDER — PREDNISONE 10 MG PO TABS: 10.0000 mg | ORAL_TABLET | Freq: Every day | ORAL | Status: AC

## 2011-03-24 MED ORDER — LEVOFLOXACIN 500 MG PO TABS: 500.0000 mg | ORAL_TABLET | Freq: Every day | ORAL | Status: AC

## 2011-03-24 MED ORDER — METHYLPREDNISOLONE ACETATE 80 MG/ML IJ SUSP
120.0000 mg | Freq: Once | INTRAMUSCULAR | Status: AC
  Administered 2011-03-24: 120 mg via INTRAMUSCULAR

## 2011-03-24 NOTE — Patient Instructions (Addendum)
You had the steroid shot today Take all new medications as prescribed - the antibiotic, cough medicine, and prednisone Continue all other medications as before Please go to XRAY in the Basement for the x-ray test Please call the phone number [phone] (the [employer]Tree System) for results of testing in 2-3 days;  When calling, simply dial the number, and when prompted enter the MRN number above (the Medical Record Number) and the # key, then the message should start.

## 2011-03-29 ENCOUNTER — Encounter: Payer: Self-pay | Admitting: Internal Medicine

## 2011-03-29 DIAGNOSIS — J209 Acute bronchitis, unspecified: Secondary | ICD-10-CM | POA: Insufficient documentation

## 2011-03-29 NOTE — Assessment & Plan Note (Signed)
stable overall by hx and exam, most recent data reviewed with pt, and pt to continue medical treatment as before  Lab Results  Component Value Date   WBC 5.5 08/15/2010   HGB 15.1* 08/15/2010   HCT 43.4 08/15/2010   PLT 232.0 08/15/2010   CHOL 191 09/16/2010   TRIG 68.0 09/16/2010   HDL 78.90 09/16/2010   LDLDIRECT 196.0 08/15/2010   ALT 21 09/16/2010   AST 23 09/16/2010   NA 138 08/15/2010   K 4.1 08/15/2010   CL 99 08/15/2010   CREATININE 0.6 08/15/2010   BUN 17 08/15/2010   CO2 32 08/15/2010   TSH 0.55 08/15/2010    

## 2011-03-29 NOTE — Assessment & Plan Note (Signed)
Mild to mod, for antibx course,  to f/u any worsening symptoms or concerns 

## 2011-03-29 NOTE — Assessment & Plan Note (Signed)
stable overall by hx and exam, and pt to continue medical treatment as before 

## 2011-03-29 NOTE — Assessment & Plan Note (Signed)
Mild to mod, for depomerol IM, and predpack asd,  to f/u any worsening symptoms or concerns

## 2011-03-29 NOTE — Progress Notes (Signed)
Subjective:    Patient ID: Michele Martin, female    DOB: Jan 03, 1946, 65 y.o.   MRN: [id number]  HPI  Here with acute onset mild to mod 2-3 days ST, HA, general weakness and malaise, with prod cough greenish sputum, but Pt denies chest pain, increased sob or doe, wheezing, orthopnea, PND, increased LE swelling, palpitations, dizziness or syncope, except for onset wheezing in the past 2 days, now mild to mod.  Pt denies new neurological symptoms such as new headache, or facial or extremity weakness or numbness   Pt denies polydipsia, polyuria.  Denies worsening depressive symptoms, suicidal ideation, or panic, though has ongoing anxiety, not increased recently. Overall good compliance with treatment, and good medicine tolerability.  Past Medical History  Diagnosis Date  . HYPERLIPIDEMIA 09/19/2009  . ANXIETY 04/15/2010  . DEPRESSION 09/19/2009  . Acute angle-closure glaucoma 02/14/2010  . ALLERGIC RHINITIS 09/19/2009  . CHRONIC OBSTRUCTIVE PULMONARY DISEASE, ACUTE EXACERBATION 12/19/2009  . ASTHMA 09/19/2009  . COPD 06/27/2009  . BACK PAIN 02/14/2010  . TREMOR 02/14/2010  . HOARSENESS 10/17/2009  . Wheezing 06/27/2009  . MENOPAUSAL DISORDER 08/20/2010  . TINNITUS 09/10/2010  . URI 09/10/2010  . OSTEOPENIA 09/10/2010   Past Surgical History  Procedure Date  . Normal heart cath 2001    Dr. Eden Emms  . Abdominal hysterectomy   . S/p laser tx for glaucoma     no vision loss    reports that she has quit smoking. She does not have any smokeless tobacco history on file. She reports that she does not drink alcohol or use illicit drugs. family history includes Dementia in her mother. Allergies  Allergen Reactions  . Pravastatin     REACTION: itch and leg cramp  . Simvastatin     REACTION: itch and leg cramps   Current Outpatient Prescriptions on File Prior to Visit  Medication Sig Dispense Refill  . albuterol (PROAIR HFA) 108 (90 BASE) MCG/ACT inhaler Inhale 2 puffs into the lungs every 4 (four)  hours as needed. For shortness of breath  1 Inhaler  11  . aspirin 81 MG EC tablet Take 81 mg by mouth daily.        [name] atorvastatin (LIPITOR) 10 MG tablet Take 10 mg by mouth daily.        . budesonide-formoterol (SYMBICORT) 160-4.5 MCG/ACT inhaler Inhale 2 puffs into the lungs 2 (two) times daily.  1 Inhaler  11  . escitalopram (LEXAPRO) 10 MG tablet Take 1 tablet (10 mg total) by mouth daily.  30 tablet  11  . fexofenadine (ALLEGRA) 180 MG tablet Take 180 mg by mouth daily.        . hydrochlorothiazide (,MICROZIDE/HYDRODIURIL,) 12.5 MG capsule Take 1 capsule (12.5 mg total) by mouth daily.  90 capsule  3  . ipratropium-albuterol (DUONEB) 0.5-2.5 (3) MG/3ML SOLN Take 3 mLs by nebulization every 6 (six) hours as needed.  360 mL  3  . Nebulizers (COMPRESSOR/NEBULIZER) MISC Inhale 1 Device into the lungs 4 (four) times daily as needed.  1 each  0  . tiotropium (SPIRIVA HANDIHALER) 18 MCG inhalation capsule Place 1 capsule (18 mcg total) into inhaler and inhale daily.  30 capsule  12   Review of Systems Review of Systems  Constitutional: Negative for diaphoresis and unexpected weight change.  HENT: Negative for drooling and tinnitus.   Eyes: Negative for photophobia and visual disturbance.  Respiratory: Negative for choking and stridor.   Gastrointestinal: Negative for vomiting and blood in stool.  Genitourinary: Negative  for hematuria and decreased urine volume.    Objective:   Physical Exam BP 102/62  Pulse 88  Temp(Src) 98.3 F (36.8 C) (Oral)  Ht 5\' 2"  (1.575 m)  Wt 113 lb 4 oz (51.37 kg)  BMI 20.71 kg/m2  SpO2 94% Physical Exam  VS noted, mild ill Constitutional: Pt appears well-developed and well-nourished.  HENT: Head: Normocephalic.  Right Ear: External ear normal.  Left Ear: External ear normal.  Bilat tm's mild erythema.  Sinus nontender.  Pharynx mild erythema Eyes: Conjunctivae and EOM are normal. Pupils are equal, round, and reactive to light.  Neck: Normal range of  motion. Neck supple.  Cardiovascular: Normal rate and regular rhythm.   Pulmonary/Chest: Effort normal and breath sounds decreased with mild wheeze bilat.  Neurological: Pt is alert. No cranial nerve deficit.  Skin: Skin is warm. No erythema.  No edema Psychiatric: Pt behavior is normal. Thought content normal.         Assessment & Plan:

## 2011-04-14 ENCOUNTER — Ambulatory Visit (INDEPENDENT_AMBULATORY_CARE_PROVIDER_SITE_OTHER): Payer: Medicare Other | Admitting: Internal Medicine

## 2011-04-14 ENCOUNTER — Other Ambulatory Visit (INDEPENDENT_AMBULATORY_CARE_PROVIDER_SITE_OTHER): Payer: Medicare Other

## 2011-04-14 ENCOUNTER — Encounter: Payer: Self-pay | Admitting: Internal Medicine

## 2011-04-14 VITALS — BP 118/68 | HR 78 | Temp 98.5°F | Wt 115.0 lb

## 2011-04-14 DIAGNOSIS — R5381 Other malaise: Secondary | ICD-10-CM

## 2011-04-14 DIAGNOSIS — Z23 Encounter for immunization: Secondary | ICD-10-CM

## 2011-04-14 DIAGNOSIS — J42 Unspecified chronic bronchitis: Secondary | ICD-10-CM | POA: Insufficient documentation

## 2011-04-14 DIAGNOSIS — R5383 Other fatigue: Secondary | ICD-10-CM

## 2011-04-14 DIAGNOSIS — F411 Generalized anxiety disorder: Secondary | ICD-10-CM

## 2011-04-14 DIAGNOSIS — J209 Acute bronchitis, unspecified: Secondary | ICD-10-CM

## 2011-04-14 DIAGNOSIS — J441 Chronic obstructive pulmonary disease with (acute) exacerbation: Secondary | ICD-10-CM

## 2011-04-14 LAB — HEPATIC FUNCTION PANEL
ALT: 39 U/L — ABNORMAL HIGH (ref 0–35)
AST: 40 U/L — ABNORMAL HIGH (ref 0–37)
Alkaline Phosphatase: 49 U/L (ref 39–117)
Bilirubin, Direct: 0.1 mg/dL (ref 0.0–0.3)
Total Bilirubin: 0.7 mg/dL (ref 0.3–1.2)

## 2011-04-14 LAB — BASIC METABOLIC PANEL
BUN: 15 mg/dL (ref 6–23)
CO2: 32 mEq/L (ref 19–32)
Calcium: 9.7 mg/dL (ref 8.4–10.5)
GFR: 84.61 mL/min (ref >60.00)
Glucose, Bld: 95 mg/dL (ref 70–99)
Sodium: 142 mEq/L (ref 135–145)

## 2011-04-14 LAB — CBC WITH DIFFERENTIAL/PLATELET
Basophils Relative: 0.4 % (ref 0.0–3.0)
Eosinophils Relative: 2.4 % (ref 0.0–5.0)
Lymphocytes Relative: 18.9 % (ref 12.0–46.0)
MCV: 99.3 fl (ref 78.0–100.0)
Monocytes Relative: 5.4 % (ref 3.0–12.0)
Neutrophils Relative %: 72.9 % (ref 43.0–77.0)
Platelets: 230 10*3/uL (ref 150.0–400.0)
RBC: 4.3 Mil/uL (ref 3.87–5.11)
WBC: 7.8 10*3/uL (ref 4.5–10.5)

## 2011-04-14 LAB — TSH: TSH: 0.57 u[IU]/mL (ref 0.35–5.50)

## 2011-04-14 MED ORDER — PREDNISONE 10 MG PO TABS: ORAL_TABLET | ORAL | Status: DC

## 2011-04-14 MED ORDER — METHYLPREDNISOLONE ACETATE 80 MG/ML IJ SUSP
120.0000 mg | Freq: Once | INTRAMUSCULAR | Status: AC
  Administered 2011-04-14: 120 mg via INTRAMUSCULAR

## 2011-04-14 NOTE — Assessment & Plan Note (Signed)
stable overall by hx and exam, most recent data reviewed with pt, and pt to continue medical treatment as before  Lab Results  Component Value Date   WBC 5.5 08/15/2010   HGB 15.1* 08/15/2010   HCT 43.4 08/15/2010   PLT 232.0 08/15/2010   CHOL 191 09/16/2010   TRIG 68.0 09/16/2010   HDL 78.90 09/16/2010   LDLDIRECT 196.0 08/15/2010   ALT 21 09/16/2010   AST 23 09/16/2010   NA 138 08/15/2010   K 4.1 08/15/2010   CL 99 08/15/2010   CREATININE 0.6 08/15/2010   BUN 17 08/15/2010   CO2 32 08/15/2010   TSH 0.55 08/15/2010

## 2011-04-14 NOTE — Patient Instructions (Addendum)
You had the steroid shot today Take all new medications as prescribed - the prednisone Continue all other medications as before Please call if you would like referral to pulmonary Please go to LAB in the Basement for the blood and/or urine tests to be done today Please call the phone number [phone] (the [employer] System) for results of testing in 2-3 days;  When calling, simply dial the number, and when prompted enter the MRN number above (the Medical Record Number) and the # key, then the message should start.

## 2011-04-14 NOTE — Assessment & Plan Note (Addendum)
Improved but mild persistent, for depomedrol IM repeat today. predpack asd,  to f/u any worsening symptoms or concerns, again declines pulm referral

## 2011-04-14 NOTE — Assessment & Plan Note (Addendum)
Resolved, no further antibx needed at this time, aug 27 cxr reveiwed with pt, pt reassured,  to f/u any worsening symptoms or concerns

## 2011-04-14 NOTE — Assessment & Plan Note (Signed)
Quite distressing to her that she is not "bouncing back" with usual stamina after her recent illness as she has done so many times in the past;  For labs today - r/o anemia, thyroid et al,  to f/u any worsening symptoms or concerns

## 2011-04-20 ENCOUNTER — Encounter: Payer: Self-pay | Admitting: Internal Medicine

## 2011-04-20 NOTE — Progress Notes (Signed)
Subjective:    Patient ID: Michele Martin, female    DOB: [birth year], 64 y.o.   MRN: [id number]  HPI  Here with resolution of her acute onset ST, HA, general weakness and malaise, with prod cough greenish sputum, and Pt denies chest pain, increased sob or doe, wheezing, orthopnea, PND, increased LE swelling, palpitations, dizziness or syncope except for 1-2 days mild wheezing,sob.  Pt denies new neurological symptoms such as new headache, or facial or extremity weakness or numbness   Pt denies polydipsia, polyuria  Pt states overall good compliance with meds, trying to follow lower cholesterol diet, wt overall stable but little exercise however.   Does have sense of ongoing fatigue, but denies signficant hypersomnolence.  Denies worsening depressive symptoms, suicidal ideation, or panic, though has ongoing anxiety, not increased recently.   Past Medical History  Diagnosis Date  . HYPERLIPIDEMIA 09/19/2009  . ANXIETY 04/15/2010  . DEPRESSION 09/19/2009  . Acute angle-closure glaucoma 02/14/2010  . ALLERGIC RHINITIS 09/19/2009  . CHRONIC OBSTRUCTIVE PULMONARY DISEASE, ACUTE EXACERBATION 12/19/2009  . ASTHMA 09/19/2009  . COPD 06/27/2009  . BACK PAIN 02/14/2010  . TREMOR 02/14/2010  . HOARSENESS 10/17/2009  . Wheezing 06/27/2009  . MENOPAUSAL DISORDER 08/20/2010  . TINNITUS 09/10/2010  . URI 09/10/2010  . OSTEOPENIA 09/10/2010  . Chronic bronchitis 04/14/2011   Past Surgical History  Procedure Date  . Normal heart cath 2001    Dr. Eden Emms  . Abdominal hysterectomy   . S/p laser tx for glaucoma     no vision loss    reports that she has quit smoking. She does not have any smokeless tobacco history on file. She reports that she does not drink alcohol or use illicit drugs. family history includes Dementia in her mother. Allergies  Allergen Reactions  . Pravastatin     REACTION: itch and leg cramp  . Simvastatin     REACTION: itch and leg cramps   Current Outpatient Prescriptions on File Prior to  Visit  Medication Sig Dispense Refill  . aspirin 81 MG EC tablet Take 81 mg by mouth daily.        [name] atorvastatin (LIPITOR) 10 MG tablet Take 10 mg by mouth daily.        . budesonide-formoterol (SYMBICORT) 160-4.5 MCG/ACT inhaler Inhale 2 puffs into the lungs 2 (two) times daily.  1 Inhaler  11  . fexofenadine (ALLEGRA) 180 MG tablet Take 180 mg by mouth daily.        . hydrochlorothiazide (,MICROZIDE/HYDRODIURIL,) 12.5 MG capsule Take 1 capsule (12.5 mg total) by mouth daily.  90 capsule  3  . ipratropium-albuterol (DUONEB) 0.5-2.5 (3) MG/3ML SOLN Take 3 mLs by nebulization every 6 (six) hours as needed.  360 mL  3  . Nebulizers (COMPRESSOR/NEBULIZER) MISC Inhale 1 Device into the lungs 4 (four) times daily as needed.  1 each  0  . tiotropium (SPIRIVA HANDIHALER) 18 MCG inhalation capsule Place 1 capsule (18 mcg total) into inhaler and inhale daily.  30 capsule  12  . albuterol (PROAIR HFA) 108 (90 BASE) MCG/ACT inhaler Inhale 2 puffs into the lungs every 4 (four) hours as needed. For shortness of breath  1 Inhaler  11   Review of Systems Review of Systems  Constitutional: Negative for diaphoresis and unexpected weight change.  HENT: Negative for drooling and tinnitus.   Eyes: Negative for photophobia and visual disturbance.  Respiratory: Negative for choking and stridor.   Gastrointestinal: Negative for vomiting and blood in stool.  Genitourinary: Negative  for hematuria and decreased urine volume.      Objective:   Physical Exam BP 118/68  Pulse 78  Temp(Src) 98.5 F (36.9 C) (Oral)  Wt 115 lb (52.164 kg)  SpO2 95% Physical Exam  VS noted, mild ill Constitutional: Pt appears well-developed and well-nourished.  HENT: Head: Normocephalic.  Right Ear: External ear normal.  Left Ear: External ear normal.  Bilat tm's mild erythema.  Sinus nontender.  Pharynx mild erythema Eyes: Conjunctivae and EOM are normal. Pupils are equal, round, and reactive to light.  Neck: Normal range of  motion. Neck supple.  Cardiovascular: Normal rate and regular rhythm.   Pulmonary/Chest: Effort normal and breath sounds decresaed with mild wheezing, no rales.  Neurological: Pt is alert. No cranial nerve deficit.  Skin: Skin is warm. No erythema.  Psychiatric: Pt behavior is normal. Thought content normal. 1+ nervous        Assessment & Plan:

## 2011-05-05 ENCOUNTER — Telehealth: Payer: Self-pay

## 2011-05-05 DIAGNOSIS — M25511 Pain in right shoulder: Secondary | ICD-10-CM

## 2011-05-05 NOTE — Telephone Encounter (Signed)
Patient called requesting referral for right arm/shoulder pain as discussed at OV.

## 2011-05-05 NOTE — Telephone Encounter (Signed)
Done per emr 

## 2011-05-08 ENCOUNTER — Other Ambulatory Visit: Payer: Self-pay

## 2011-05-08 DIAGNOSIS — J441 Chronic obstructive pulmonary disease with (acute) exacerbation: Secondary | ICD-10-CM

## 2011-05-08 MED ORDER — IPRATROPIUM-ALBUTEROL 0.5-2.5 (3) MG/3ML IN SOLN: 3.0000 mL | Freq: Four times a day (QID) | RESPIRATORY_TRACT | Status: DC | PRN

## 2011-05-13 ENCOUNTER — Encounter: Payer: Self-pay | Admitting: Internal Medicine

## 2011-05-13 ENCOUNTER — Ambulatory Visit (INDEPENDENT_AMBULATORY_CARE_PROVIDER_SITE_OTHER): Payer: Medicare Other | Admitting: Internal Medicine

## 2011-05-13 VITALS — BP 120/62 | HR 81 | Temp 98.3°F | Ht 62.0 in | Wt 112.1 lb

## 2011-05-13 DIAGNOSIS — Z136 Encounter for screening for cardiovascular disorders: Secondary | ICD-10-CM

## 2011-05-13 DIAGNOSIS — Z01818 Encounter for other preprocedural examination: Secondary | ICD-10-CM

## 2011-05-13 DIAGNOSIS — J441 Chronic obstructive pulmonary disease with (acute) exacerbation: Secondary | ICD-10-CM

## 2011-05-13 DIAGNOSIS — J209 Acute bronchitis, unspecified: Secondary | ICD-10-CM

## 2011-05-13 MED ORDER — PREDNISONE 10 MG PO TABS: 10.0000 mg | ORAL_TABLET | Freq: Every day | ORAL | Status: AC

## 2011-05-13 MED ORDER — AZITHROMYCIN 250 MG PO TABS: ORAL_TABLET | ORAL | Status: AC

## 2011-05-13 NOTE — Assessment & Plan Note (Signed)
From IM standpoint has no other significant issues and ok for surgury, most recent labs reviewed with pt, has hx of false + stress test 2001 with subsequent normal cors on cath per Dr Eden Emms, and pt without CP or related symptoms except for the chronic recurrent dyspnea and repeated COPD exacerbations;  Echo June 2012 reviewed with pt - normal EF; ECG reviewed as per emr;    BUT pt will absolutely need pulm eval /PFT's for clearance as well, as I suspect is at least mod to high risk related to the degree of her COPD and recurrent exacerbations

## 2011-05-13 NOTE — Assessment & Plan Note (Signed)
Again acute on chronic today - for antibx,  to f/u any worsening symptoms or concerns

## 2011-05-13 NOTE — Patient Instructions (Signed)
Take all new medications as prescribed Continue all other medications as before You will be contacted regarding the referral for: lung testing, and pulmonary evaluation

## 2011-05-13 NOTE — Assessment & Plan Note (Signed)
Again mild today - for predpack asd, Continue all other medications as before, to f/u any worsening symptoms or concerns

## 2011-05-13 NOTE — Progress Notes (Signed)
Subjective:    Patient ID: Michele Martin, female    DOB: [birth year], 65 y.o.   MRN: [id number]  HPI   Here to f/u;  Is ostensibly for preop right rotater cuff surgury (and for MRI soon per ortho), but also with a mild flare of her acute on chronic bronchitis and mild wheeze but not as severe as recent episodes , for 2-3 days.  Pt denies chest pain, increased sob or doe, wheezing, orthopnea, PND, increased LE swelling, palpitations, dizziness or syncope, except for the above.  Pt denies new neurological symptoms such as new headache, or facial or extremity weakness or numbness.   Pt denies polydipsia, polyuria  Pt states overall good compliance with meds, trying to follow lower cholesterol diet, wt overall stable but little exercise however.  Last stress test 2001 was false +, leading to normal cath 2001 (no eval since then).  Had echo June 2012 with normal EF and no other significant abnormal.  No recent PFT's, and has been reluctant to follow with pulmonary to date.   Had basic labs sept 2012 documented.  No other acute complaints Past Medical History  Diagnosis Date  . HYPERLIPIDEMIA 09/19/2009  . ANXIETY 04/15/2010  . DEPRESSION 09/19/2009  . Acute angle-closure glaucoma 02/14/2010  . ALLERGIC RHINITIS 09/19/2009  . CHRONIC OBSTRUCTIVE PULMONARY DISEASE, ACUTE EXACERBATION 12/19/2009  . ASTHMA 09/19/2009  . COPD 06/27/2009  . BACK PAIN 02/14/2010  . TREMOR 02/14/2010  . HOARSENESS 10/17/2009  . Wheezing 06/27/2009  . MENOPAUSAL DISORDER 08/20/2010  . TINNITUS 09/10/2010  . URI 09/10/2010  . OSTEOPENIA 09/10/2010  . Chronic bronchitis 04/14/2011   Past Surgical History  Procedure Date  . Normal heart cath 2001    Dr. Eden Emms  . Abdominal hysterectomy   . S/p laser tx for glaucoma     no vision loss    reports that she has quit smoking. She does not have any smokeless tobacco history on file. She reports that she does not drink alcohol or use illicit drugs. family history includes Dementia in  her mother. Allergies  Allergen Reactions  . Pravastatin     REACTION: itch and leg cramp  . Simvastatin     REACTION: itch and leg cramps   Current Outpatient Prescriptions on File Prior to Visit  Medication Sig Dispense Refill  . albuterol (PROAIR HFA) 108 (90 BASE) MCG/ACT inhaler Inhale 2 puffs into the lungs every 4 (four) hours as needed. For shortness of breath  1 Inhaler  11  . aspirin 81 MG EC tablet Take 81 mg by mouth daily.        [name] atorvastatin (LIPITOR) 10 MG tablet Take 10 mg by mouth daily.        . budesonide-formoterol (SYMBICORT) 160-4.5 MCG/ACT inhaler Inhale 2 puffs into the lungs 2 (two) times daily.  1 Inhaler  11  . fexofenadine (ALLEGRA) 180 MG tablet Take 180 mg by mouth daily.        . hydrochlorothiazide (,MICROZIDE/HYDRODIURIL,) 12.5 MG capsule Take 1 capsule (12.5 mg total) by mouth daily.  90 capsule  3  . ipratropium-albuterol (DUONEB) 0.5-2.5 (3) MG/3ML SOLN Take 3 mLs by nebulization every 6 (six) hours as needed.  360 mL  3  . Nebulizers (COMPRESSOR/NEBULIZER) MISC Inhale 1 Device into the lungs 4 (four) times daily as needed.  1 each  0  . tiotropium (SPIRIVA HANDIHALER) 18 MCG inhalation capsule Place 1 capsule (18 mcg total) into inhaler and inhale daily.  30 capsule  12  Review of Systems Review of Systems  Constitutional: Negative for diaphoresis and unexpected weight change.  HENT: Negative for drooling and tinnitus.   Eyes: Negative for photophobia and visual disturbance.  Respiratory: Negative for choking and stridor.   Gastrointestinal: Negative for vomiting and blood in stool.  Genitourinary: Negative for hematuria and decreased urine volume.  Musculoskeletal: Negative for gait problem.  Skin: Negative for color change and wound.  Neurological: Negative for tremors and numbness.  Psychiatric/Behavioral: Negative for decreased concentration. The patient is not hyperactive.      Objective:   Physical Exam BP 120/62  Pulse 81  Temp(Src)  98.3 F (36.8 C) (Oral)  Ht 5\' 2"  (1.575 m)  Wt 112 lb 1.9 oz (50.857 kg)  BMI 20.51 kg/m2  SpO2 93% Physical Exam  VS noted, mild ill Constitutional: Pt appears well-developed and well-nourished.  HENT: Head: Normocephalic.  Right Ear: External ear normal.  Left Ear: External ear normal.   Chronic hoarseness persists Bilat tm's mild erythema.  Sinus nontender.  Pharynx mild erythema Eyes: Conjunctivae and EOM are normal. Pupils are equal, round, and reactive to light.  Neck: Normal range of motion. Neck supple.  Cardiovascular: Normal rate and regular rhythm.   Pulmonary/Chest: Effort normal and breath sounds mild decreased with mild wheeze bilat.  Abd:  Soft, NT, non-distended, + BS Neurological: Pt is alert. No cranial nerve deficit.  Skin: Skin is warm. No erythema.  Psychiatric: Pt behavior is normal. Thought content normal. 1+ nervous    Assessment & Plan:

## 2011-05-20 ENCOUNTER — Encounter: Payer: Self-pay | Admitting: Internal Medicine

## 2011-05-20 ENCOUNTER — Ambulatory Visit (INDEPENDENT_AMBULATORY_CARE_PROVIDER_SITE_OTHER): Payer: Medicare Other | Admitting: Internal Medicine

## 2011-05-20 DIAGNOSIS — J449 Chronic obstructive pulmonary disease, unspecified: Secondary | ICD-10-CM

## 2011-05-20 DIAGNOSIS — F17200 Nicotine dependence, unspecified, uncomplicated: Secondary | ICD-10-CM

## 2011-05-20 MED ORDER — ALBUTEROL SULFATE (2.5 MG/3ML) 0.083% IN NEBU: 2.5000 mg | INHALATION_SOLUTION | Freq: Four times a day (QID) | RESPIRATORY_TRACT | Status: DC | PRN

## 2011-05-20 NOTE — Progress Notes (Signed)
  Subjective:    Patient ID: Michele Martin, female    DOB: [birth year], 65 y.o.   MRN: [id number]  HPI  43 yowf smoker referred to pulmonary clinic by Dr Jonny Ruiz for eval of sob  05/20/2011 Initial pulmonary office eval cc sob x "many years" indolent onset and quit variable = much better on symbicort and spiriva  mostly with humidity but when the weather's cool can do anything she wants x for heavy yardwork. Also using neb qid  Not prn.    Sleeping ok without nocturnal  or early am exacerbation  of respiratory  c/o's or need for noct saba. Also denies any obvious fluctuation of symptoms with weather or environmental changes or other aggravating or alleviating factors except as outlined above.  No assoc ex cp, itching sneezing or wheezing.   Review of Systems  Constitutional: Negative for fever, chills and unexpected weight change.  HENT: Negative for ear pain, nosebleeds, congestion, sore throat, rhinorrhea, sneezing, trouble swallowing, dental problem, voice change, postnasal drip and sinus pressure.   Eyes: Negative for visual disturbance.  Respiratory: Positive for shortness of breath. Negative for cough and choking.   Cardiovascular: Negative for chest pain and leg swelling.  Gastrointestinal: Negative for vomiting, abdominal pain and diarrhea.  Genitourinary: Negative for difficulty urinating.  Musculoskeletal: Negative for arthralgias.  Skin: Negative for rash.  Neurological: Negative for tremors, syncope and headaches.  Hematological: Does not bruise/bleed easily.       Objective:   Physical Exam  Wt 116 05/20/2011  Stoic amb wf who tends to downplay her symptoms dramatically HEENT mild turbinate edema.  Oropharynx no thrush or excess pnd or cobblestoning.  No JVD or cervical adenopathy. Mild accessory muscle hypertrophy. Trachea midline, nl thryroid. Chest was hyperinflated by percussion with diminished breath sounds and moderate increased exp time without wheeze. Hoover sign  positive at mid inspiration. Regular rate and rhythm without murmur gallop or rub or increase P2 or edema.  Abd: no hsm, nl excursion. Ext warm without cyanosis or clubbing.    cxr 03/14/11 COPD/chronic bronchitis. No acute superimposed process.     Assessment & Plan:

## 2011-05-20 NOTE — Patient Instructions (Addendum)
You must stop smoking before smoking stops you  Symbicort 160 Take 2 puffs first thing in am and then another 2 puffs about 12 hours later.    and chase the am dose of symbicort with spiriva  Only use your albuterol (not duoneb, though ok to use it up) as a rescue medication to be used if you can't catch your breath by resting or doing a relaxed purse lip breathing pattern. The less you use it, the better it will work when you need it.    If you are satisfied with your treatment plan let your doctor know and he/she can either refill your medications or you can return here when your prescription runs out.     If in any way you are not 100% satisfied,  please tell us.  If 100% better, tell your friends!

## 2011-05-22 ENCOUNTER — Encounter: Payer: Self-pay | Admitting: Internal Medicine

## 2011-05-22 DIAGNOSIS — Z720 Tobacco use: Secondary | ICD-10-CM | POA: Insufficient documentation

## 2011-05-22 NOTE — Assessment & Plan Note (Addendum)
Extended with this pt who dramatically downplays both her symptoms and the effects cigarettes are having on her best day functional status. I suspect she has a significant asthmatic component that once off cigarettes, the question is how far she'll decline before she "levels out"  I reviewed the Flethcher curve with patient that basically indicates  if you quit smoking when your best day FEV1 is still well preserved it is highly unlikely you will progress to severe disease and informed the patient there was no medication on the market that has proven to change the curve or the likelihood of progression.  Therefore stopping smoking and maintaining abstinence is the most important aspect of care, not choice of inhalers or for that matter, doctors.    Therefore pulmonary f/u can be prn  The proper method of use, as well as anticipated side effects, of this metered-dose inhaler are discussed and demonstrated to the patient.  See instructions for specific recommendations which were reviewed directly with the patient who was given a copy with highlighter outlining the key components.

## 2011-05-22 NOTE — Assessment & Plan Note (Signed)
I took an extended  opportunity with this patient to outline the consequences of continued cigarette use  in airway disorders based on all the data we have from the multiple national lung health studies (perfomed over decades at millions of dollars in cost)  indicating that smoking cessation, not choice of inhalers or physicians, is the most important aspect of care.    I emphasized that although we never turn away smokers from the pulmonary clinic, we do ask that they understand that the recommendations that we make  won't work nearly as well in the presence of continued cigarette exposure.  In fact, we may very well  reach a point where we can't promise to help the patient if he/she can't quit smoking. (We can and will promise to try to help, we just can't promise what we recommend will really work)  

## 2011-06-23 ENCOUNTER — Ambulatory Visit (INDEPENDENT_AMBULATORY_CARE_PROVIDER_SITE_OTHER): Payer: Medicare Other | Admitting: Internal Medicine

## 2011-06-23 ENCOUNTER — Encounter: Payer: Self-pay | Admitting: Internal Medicine

## 2011-06-23 VITALS — BP 122/70 | HR 74 | Temp 97.6°F | Ht 62.0 in | Wt 118.4 lb

## 2011-06-23 DIAGNOSIS — J449 Chronic obstructive pulmonary disease, unspecified: Secondary | ICD-10-CM

## 2011-06-23 DIAGNOSIS — M545 Low back pain, unspecified: Secondary | ICD-10-CM

## 2011-06-23 DIAGNOSIS — R29818 Other symptoms and signs involving the nervous system: Secondary | ICD-10-CM

## 2011-06-23 DIAGNOSIS — F411 Generalized anxiety disorder: Secondary | ICD-10-CM

## 2011-06-23 DIAGNOSIS — J4489 Other specified chronic obstructive pulmonary disease: Secondary | ICD-10-CM

## 2011-06-23 DIAGNOSIS — M75100 Unspecified rotator cuff tear or rupture of unspecified shoulder, not specified as traumatic: Secondary | ICD-10-CM

## 2011-06-23 DIAGNOSIS — R2689 Other abnormalities of gait and mobility: Secondary | ICD-10-CM

## 2011-06-23 HISTORY — DX: Unspecified rotator cuff tear or rupture of unspecified shoulder, not specified as traumatic: M75.100

## 2011-06-23 MED ORDER — PREDNISONE 10 MG PO TABS: ORAL_TABLET | ORAL | Status: DC

## 2011-06-23 MED ORDER — TRAMADOL HCL 50 MG PO TABS: 50.0000 mg | ORAL_TABLET | Freq: Four times a day (QID) | ORAL | Status: DC | PRN

## 2011-06-23 NOTE — Patient Instructions (Addendum)
Take all new medications as prescribed Continue all other medications as before You will be contacted regarding the referral for: MRI for the brain, and Dr Wong/neurology

## 2011-06-23 NOTE — Assessment & Plan Note (Signed)
stable overall by hx and exam, most recent data reviewed with pt, and pt to continue medical treatment as before SpO2 Readings from Last 3 Encounters:  06/23/11 94%  05/20/11 93%  05/13/11 93%

## 2011-06-23 NOTE — Progress Notes (Signed)
Subjective:    Patient ID: Michele Martin, female    DOB: 12-Oct-1945, 65 y.o.   MRN: [id number]  HPI  Here to f/u with new problem for her;  Pt c/o 2 wks left LBP without change in severity, constant, mild to mod, but no bowel or bladder change, fever, wt loss,  worsening LE pain/numbness/weakness, gait change or falls. Some worse to stand up and twist/turn at the waist.  Pt denies chest pain, increased sob or doe, wheezing, orthopnea, PND, increased LE swelling, palpitations, dizziness or syncope.  Pt denies new neurological symptoms such as new headache, or facial or extremity weakness or numbness   Pt denies polydipsia, polyuria.  Also with 2 mo gradually worsening balance issue, such that walking down a hallway can be difficult, sometimes "walks like drunk". Overall good compliance with treatment, and good medicine tolerability. Past Medical History  Diagnosis Date  . HYPERLIPIDEMIA 09/19/2009  . ANXIETY 04/15/2010  . DEPRESSION 09/19/2009  . Acute angle-closure glaucoma 02/14/2010  . ALLERGIC RHINITIS 09/19/2009  . CHRONIC OBSTRUCTIVE PULMONARY DISEASE, ACUTE EXACERBATION 12/19/2009  . ASTHMA 09/19/2009  . COPD 06/27/2009  . BACK PAIN 02/14/2010  . TREMOR 02/14/2010  . HOARSENESS 10/17/2009  . Wheezing 06/27/2009  . MENOPAUSAL DISORDER 08/20/2010  . TINNITUS 09/10/2010  . URI 09/10/2010  . OSTEOPENIA 09/10/2010  . Chronic bronchitis 04/14/2011  . Rotator cuff tear 06/23/2011   Past Surgical History  Procedure Date  . Normal heart cath 2001    Dr. Eden Emms  . Abdominal hysterectomy   . S/p laser tx for glaucoma     no vision loss    reports that she has been smoking Cigarettes.  She has a 20 pack-year smoking history. She has never used smokeless tobacco. She reports that she does not drink alcohol or use illicit drugs. family history includes Dementia in her mother. Allergies  Allergen Reactions  . Pravastatin     REACTION: itch and leg cramp  . Simvastatin     REACTION: itch and leg  cramps   Current Outpatient Prescriptions on File Prior to Visit  Medication Sig Dispense Refill  . albuterol (PROVENTIL) (2.5 MG/3ML) 0.083% nebulizer solution Take 3 mLs (2.5 mg total) by nebulization every 6 (six) hours as needed for wheezing.  75 mL  12  . aspirin 81 MG EC tablet Take 81 mg by mouth daily.        [name] atorvastatin (LIPITOR) 10 MG tablet Take 10 mg by mouth daily.        . budesonide-formoterol (SYMBICORT) 160-4.5 MCG/ACT inhaler Inhale 2 puffs into the lungs 2 (two) times daily.  1 Inhaler  11  . escitalopram (LEXAPRO) 10 MG tablet Take 1 by mouth daily      . fexofenadine (ALLEGRA) 180 MG tablet Take 180 mg by mouth daily.        . hydrochlorothiazide (,MICROZIDE/HYDRODIURIL,) 12.5 MG capsule Take 1 capsule (12.5 mg total) by mouth daily.  90 capsule  3  . SINGULAIR 10 MG tablet Take 1 by mouth daiy      . tiotropium (SPIRIVA HANDIHALER) 18 MCG inhalation capsule Place 1 capsule (18 mcg total) into inhaler and inhale daily.  30 capsule  12   Review of Systems Review of Systems  Constitutional: Negative for diaphoresis and unexpected weight change.  HENT: Negative for drooling and tinnitus.   Eyes: Negative for photophobia and visual disturbance.  Respiratory: Negative for choking and stridor.   Gastrointestinal: Negative for vomiting and blood in stool.  Genitourinary:  Negative for hematuria and decreased urine volume.  Musculoskeletal: Negative for gait problem.  Skin: Negative for color change and wound.  Neurological: Negative for worsening tremors and numbness.  Psychiatric/Behavioral: Negative for decreased concentration. The patient is not hyperactive.       Objective:   Physical Exam BP 122/70  Pulse 74  Temp(Src) 97.6 F (36.4 C) (Oral)  Ht 5\' 2"  (1.575 m)  Wt 118 lb 6 oz (53.695 kg)  BMI 21.65 kg/m2  SpO2 94% Physical Exam  VS noted Constitutional: Pt appears well-developed and well-nourished.  HENT: Head: Normocephalic.  Right Ear: External ear  normal.  Left Ear: External ear normal.  Eyes: Conjunctivae and EOM are normal. Pupils are equal, round, and reactive to light.  Neck: Normal range of motion. Neck supple.  Cardiovascular: Normal rate and regular rhythm.   Pulmonary/Chest: Effort normal and breath sounds decreased but no rales, does have chronic hoarseness.  Neurological: Pt is alert. No cranial nerve deficit. Motor/dtr intact, gait with abnormal tandem walk  Spine and paravertebral nontender Skin: Skin is warm. No erythema.  Psychiatric: Pt behavior is normal. Thought content normal. 1+ nervous    Assessment & Plan:

## 2011-06-23 NOTE — Assessment & Plan Note (Signed)
New onset x 2 mo per pt, with some difficulty tandem walking today, high risk of fall, already has inoperable permanent right rotater cuff tear and she is afriad of fall and other injury - will check MRI head, refer neurology

## 2011-06-23 NOTE — Assessment & Plan Note (Signed)
Mild to mod, new onset, suspect undelrying lumbar djd/ddd, will forgo films for now and does not appear radicular, will hold on MRI, tx conserv with tramadol/predpack asd,  to f/u any worsening symptoms or concerns

## 2011-06-23 NOTE — Assessment & Plan Note (Signed)
stable overall by hx and exam, most recent data reviewed with pt, and pt to continue medical treatment as before  Lab Results  Component Value Date   WBC 7.8 04/14/2011   HGB 14.3 04/14/2011   HCT 42.7 04/14/2011   PLT 230.0 04/14/2011   GLUCOSE 95 04/14/2011   CHOL 191 09/16/2010   TRIG 68.0 09/16/2010   HDL 78.90 09/16/2010   LDLDIRECT 196.0 08/15/2010   LDLCALC 99 09/16/2010   ALT 39* 04/14/2011   AST 40* 04/14/2011   NA 142 04/14/2011   K 4.1 04/14/2011   CL 103 04/14/2011   CREATININE 0.7 04/14/2011   BUN 15 04/14/2011   CO2 32 04/14/2011   TSH 0.57 04/14/2011

## 2011-06-24 ENCOUNTER — Encounter: Payer: Self-pay | Admitting: Neurology

## 2011-06-27 ENCOUNTER — Ambulatory Visit
Admission: RE | Admit: 2011-06-27 | Discharge: 2011-06-27 | Disposition: A | Discharge status: Home / Self Care | Payer: Medicare Other | Source: Ambulatory Visit | Attending: Internal Medicine | Admitting: Internal Medicine

## 2011-06-27 DIAGNOSIS — R2689 Other abnormalities of gait and mobility: Secondary | ICD-10-CM

## 2011-07-30 ENCOUNTER — Ambulatory Visit (INDEPENDENT_AMBULATORY_CARE_PROVIDER_SITE_OTHER): Payer: Medicare Other | Admitting: Internal Medicine

## 2011-07-30 ENCOUNTER — Encounter: Payer: Self-pay | Admitting: Internal Medicine

## 2011-07-30 VITALS — BP 120/80 | HR 102 | Temp 98.1°F | Ht 62.0 in | Wt 111.2 lb

## 2011-07-30 DIAGNOSIS — R251 Tremor, unspecified: Secondary | ICD-10-CM

## 2011-07-30 DIAGNOSIS — J441 Chronic obstructive pulmonary disease with (acute) exacerbation: Secondary | ICD-10-CM

## 2011-07-30 DIAGNOSIS — B9789 Other viral agents as the cause of diseases classified elsewhere: Secondary | ICD-10-CM

## 2011-07-30 DIAGNOSIS — B349 Viral infection, unspecified: Secondary | ICD-10-CM | POA: Insufficient documentation

## 2011-07-30 DIAGNOSIS — Z0000 Encounter for general adult medical examination without abnormal findings: Secondary | ICD-10-CM

## 2011-07-30 DIAGNOSIS — M545 Low back pain: Secondary | ICD-10-CM

## 2011-07-30 HISTORY — DX: Tremor, unspecified: R25.1

## 2011-07-30 MED ORDER — PROMETHAZINE HCL 25 MG PO TABS: 25.0000 mg | ORAL_TABLET | Freq: Four times a day (QID) | ORAL | Status: AC | PRN

## 2011-07-30 MED ORDER — TRAMADOL HCL 50 MG PO TABS: ORAL_TABLET | ORAL | Status: DC

## 2011-07-30 MED ORDER — DIPHENOXYLATE-ATROPINE 2.5-0.025 MG PO TABS: 1.0000 | ORAL_TABLET | Freq: Four times a day (QID) | ORAL | Status: AC | PRN

## 2011-07-30 MED ORDER — PREDNISONE 10 MG PO TABS: ORAL_TABLET | ORAL | Status: DC

## 2011-07-30 MED ORDER — OSELTAMIVIR PHOSPHATE 75 MG PO CAPS: 75.0000 mg | ORAL_CAPSULE | Freq: Two times a day (BID) | ORAL | Status: AC

## 2011-07-30 NOTE — Patient Instructions (Addendum)
Take all new medications as prescribed - the tamiflu for virus, phenergan for nausea, lomotil for diarrhea, prednisone for wheezing Increase the tramadol to 50 mg - 1 and 1/2 tabs up to four times per day as needed for pain Continue all other medications as before Please keep your appointments with your specialists as you have planned - Pulmonary

## 2011-08-01 ENCOUNTER — Encounter: Payer: Self-pay | Admitting: Neurology

## 2011-08-01 ENCOUNTER — Other Ambulatory Visit: Payer: Self-pay | Admitting: Neurology

## 2011-08-01 ENCOUNTER — Other Ambulatory Visit: Payer: Medicare Other

## 2011-08-01 ENCOUNTER — Ambulatory Visit: Payer: Medicare Other

## 2011-08-01 ENCOUNTER — Ambulatory Visit (INDEPENDENT_AMBULATORY_CARE_PROVIDER_SITE_OTHER): Payer: Medicare Other | Admitting: Neurology

## 2011-08-01 VITALS — BP 122/76 | HR 96 | Ht 61.5 in | Wt 114.0 lb

## 2011-08-01 DIAGNOSIS — M4802 Spinal stenosis, cervical region: Secondary | ICD-10-CM

## 2011-08-01 DIAGNOSIS — Z0000 Encounter for general adult medical examination without abnormal findings: Secondary | ICD-10-CM

## 2011-08-01 LAB — CBC WITH DIFFERENTIAL/PLATELET
Eosinophils Absolute: 0.1 10*3/uL (ref 0.0–0.7)
HCT: 43.4 % (ref 36.0–46.0)
Lymphs Abs: 1.5 10*3/uL (ref 0.7–4.0)
MCHC: 33.6 g/dL (ref 30.0–36.0)
MCV: 100.2 fl — ABNORMAL HIGH (ref 78.0–100.0)
Monocytes Absolute: 0.6 10*3/uL (ref 0.1–1.0)
Neutrophils Relative %: 77 % (ref 43.0–77.0)
Platelets: 312 10*3/uL (ref 150.0–400.0)
RDW: 12.7 % (ref 11.5–14.6)

## 2011-08-01 LAB — COMPREHENSIVE METABOLIC PANEL
ALT: 22 U/L (ref 0–35)
AST: 23 U/L (ref 0–37)
Albumin: 3.4 g/dL — ABNORMAL LOW (ref 3.5–5.2)
Alkaline Phosphatase: 72 U/L (ref 39–117)
Glucose, Bld: 101 mg/dL — ABNORMAL HIGH (ref 70–99)
Potassium: 3.7 mEq/L (ref 3.5–5.1)
Sodium: 141 mEq/L (ref 135–145)
Total Bilirubin: 0.4 mg/dL (ref 0.3–1.2)
Total Protein: 6.8 g/dL (ref 6.0–8.3)

## 2011-08-01 LAB — HIGH SENSITIVITY CRP: CRP, High Sensitivity: 27.5 mg/L — ABNORMAL HIGH (ref 0.000–5.000)

## 2011-08-01 LAB — TSH: TSH: 0.76 u[IU]/mL (ref 0.35–5.50)

## 2011-08-01 LAB — SEDIMENTATION RATE: Sed Rate: 52 mm/hr — ABNORMAL HIGH (ref 0–22)

## 2011-08-01 LAB — VITAMIN B12: Vitamin B-12: >1500 pg/mL — ABNORMAL HIGH (ref 211–911)

## 2011-08-01 LAB — HEMOGLOBIN A1C: Hgb A1c MFr Bld: 5.6 % (ref 4.6–6.5)

## 2011-08-01 NOTE — Progress Notes (Signed)
Dear Dr. Jonny Ruiz,  Thank you for having me see Michele Martin in consultation today at [facility] for her problem with balance.  As you may recall, she is a 66 y.o. year old female with a history of COPD and tremor who presents with progressive difficulty with walking.  She feels that she intermittently swerves into things.  The problem has been going on for over 6 months, but it came more to her attention because she notices it in her house.  She denies knowing if it is worse in the dark.  She denies numbness in her feet, but has intermittent tingling.  She has had no problems with her bowel or bladder and retains continence.  She denies vertiginous spells, but has had spells of loss of vision relieved by sitting down and provoked by standing up too quickly.  However, these are not related to her problems with her balance temporally.  An MRI was unremarkable except for a question of cervical stenosis at the high C-spine as well as a probably dural calcification in the right parafalcine area.  She has a long history of diffuse tremor, involving mainly head and hands that has been untreated.  Past Medical History  Diagnosis Date  . HYPERLIPIDEMIA 09/19/2009  . ANXIETY 04/15/2010  . DEPRESSION 09/19/2009  . Acute angle-closure glaucoma 02/14/2010  . ALLERGIC RHINITIS 09/19/2009  . CHRONIC OBSTRUCTIVE PULMONARY DISEASE, ACUTE EXACERBATION 12/19/2009  . ASTHMA 09/19/2009  . COPD 06/27/2009  . BACK PAIN 02/14/2010  . TREMOR 02/14/2010  . HOARSENESS 10/17/2009  . Wheezing 06/27/2009  . MENOPAUSAL DISORDER 08/20/2010  . TINNITUS 09/10/2010  . URI 09/10/2010  . OSTEOPENIA 09/10/2010  . Chronic bronchitis 04/14/2011  . Rotator cuff tear 06/23/2011  . Tremor 07/30/2011    Past Surgical History  Procedure Date  . Normal heart cath 2001    Dr. Eden Emms  . Abdominal hysterectomy   . S/p laser tx for glaucoma     no vision loss    History   Social History  . Marital Status: Widowed    Spouse Name:  N/A    Number of Children: 2  . Years of Education: N/A   Occupational History  . work part time/mostly retired - Neurosurgeon    Social History Main Topics  . Smoking status: Current Everyday Smoker -- 0.5 packs/day for 40 years    Types: Cigarettes  . Smokeless tobacco: Never Used  . Alcohol Use: No  . Drug Use: No  . Sexually Active: None   Other Topics Concern  . None   Social History Narrative  . None    Family History  Problem Relation Age of Onset  . Dementia Mother     Current Outpatient Prescriptions on File Prior to Visit  Medication Sig Dispense Refill  . albuterol (PROVENTIL) (2.5 MG/3ML) 0.083% nebulizer solution Take 3 mLs (2.5 mg total) by nebulization every 6 (six) hours as needed for wheezing.  75 mL  12  . aspirin 81 MG EC tablet Take 81 mg by mouth daily.        [name] atorvastatin (LIPITOR) 10 MG tablet Take 10 mg by mouth daily.        . budesonide-formoterol (SYMBICORT) 160-4.5 MCG/ACT inhaler Inhale 2 puffs into the lungs 2 (two) times daily.  1 Inhaler  11  . escitalopram (LEXAPRO) 10 MG tablet Take 1 by mouth daily      . fexofenadine (ALLEGRA) 180 MG tablet Take 180 mg by mouth daily.        [name]  hydrochlorothiazide (,MICROZIDE/HYDRODIURIL,) 12.5 MG capsule Take 1 capsule (12.5 mg total) by mouth daily.  90 capsule  3  . oseltamivir (TAMIFLU) 75 MG capsule Take 1 capsule (75 mg total) by mouth 2 (two) times daily.  10 capsule  0  . predniSONE (DELTASONE) 10 MG tablet 1 tab by mouth per day  7 tablet  0  . SINGULAIR 10 MG tablet Take 1 by mouth daiy      . tiotropium (SPIRIVA HANDIHALER) 18 MCG inhalation capsule Place 1 capsule (18 mcg total) into inhaler and inhale daily.  30 capsule  12  . traMADol (ULTRAM) 50 MG tablet 1 and 1/2 tab by mouth four times daily as needed for pain  180 tablet  2  . diphenoxylate-atropine (LOMOTIL) 2.5-0.025 MG per tablet Take 1 tablet by mouth 4 (four) times daily as needed for diarrhea or loose stools.   40 tablet  0  . promethazine (PHENERGAN) 25 MG tablet Take 1 tablet (25 mg total) by mouth every 6 (six) hours as needed for nausea.  30 tablet  0    Allergies  Allergen Reactions  . Pravastatin     REACTION: itch and leg cramp  . Simvastatin     REACTION: itch and leg cramps      ROS:  13 systems were reviewed and are notable for no history of headache.  Patient has history of anxiety and depression.  All other review of systems are unremarkable.   Examination:  Filed Vitals:   08/01/11 1018  BP: 122/76  Pulse: 96  Height: 5' 1.5" (1.562 m)  Weight: 114 lb (51.71 kg)     In general, tremulous appearing women in NAD.  Cardiovascular: The patient has a regular rate and rhythm and no carotid bruits.  Fundoscopy:  Disks are flat. Vessel caliber within normal limits.  Mental status:   The patient is oriented to person, place and time. Recent and remote memory are intact. Attention span and concentration are normal. Language including repetition, naming, following commands are intact. Fund of knowledge of current and historical events, as well as vocabulary are normal.  Cranial Nerves: Pupils are equally round and reactive to light. Visual fields full to confrontation. Extraocular movements are intact without nystagmus. Facial sensation and muscles of mastication are intact. Muscles of facial expression are symmetric. Hearing intact to bilateral finger rub. Tongue protrusion, uvula, palate midline.  Shoulder shrug intact  Motor:  The patient has normal bulk and tone(supine leg tone reveals significant paratonia), no pronator drift(although she has difficulty lifting her right arm due to her rotator cuff problems).  She has a diffuse mainly postural tremor, most in the hands, but also in the head.  5/5 bilaterally.  Reflexes:   Biceps  Triceps Brachioradialis Knee Ankle  Right 1+  1+  1+   2+ 2+  Left  2+  1+  1+   2+ 1+  Toes down  Coordination:  Normal finger to nose.   No dysdiadokinesia.  Normal H2S.  No rebound.  Sensation is intact to decreased to temperature and vibration in the right leg and right hand.  Position sense normal bilaterally.  Gait and Station are normal.  Tandem gait is impaired.  Romberg is positive.  MRI brain was reviewed and did not reveal significant cerebellar atrophy.  The high C-spine stenosis was appreciated.   Impression/Recs: Gait instability without clear localization.  I cannot clearly implicate her cerebellar or vestibular systems at this time.  Her Romberg positivity makes me  concerned from a problem with proprioception, likely referable to her posterior columns.  Her reflexes in her legs are also slightly brisker than her arms, suggesting that her cervical spine may be the culprit here.  I am going to get an C-spine MRI brain to look for a myelopathy, as well as screening peripheral neuropathy labs.  We will consider an EMG/NCS at a later date if the C-spine MRI is unremarkable.  I am not sure what to make of her right sided sensory loss.  We will see the patient back in 6 weeks.  Thank you for having [place] see Michele Martin in consultation.  Feel free to contact me with any questions.  Lupita Raider Modesto Charon, MD [facility], [place] [address] [place], [place] [address] Phone: [phone] Fax: [phone].

## 2011-08-01 NOTE — Patient Instructions (Addendum)
Your follow up appointment with Dr. Modesto Charon is on February 20th at 1030 am.  Your MRI is scheduled at [facility] located at [address] (beside the hospital) on Wednesday, January 9th at 1200 noon.  Please arrive 15 minutes prior to your appointment.  [phone].

## 2011-08-02 LAB — C-REACTIVE PROTEIN: CRP: 2.57 mg/dL — ABNORMAL HIGH (ref <0.60)

## 2011-08-03 ENCOUNTER — Encounter: Payer: Self-pay | Admitting: Internal Medicine

## 2011-08-03 NOTE — Assessment & Plan Note (Signed)
Mild, for tamiflu for clincial dx influenza,  to f/u any worsening symptoms or concerns

## 2011-08-03 NOTE — Progress Notes (Signed)
Subjective:    Patient ID: Michele Martin, female    DOB: 03-30-1946, 66 y.o.   MRN: [id number]  HPI  Here with 2 days acute onset viral flu like syndrome with fever, mild scratchy throat, myalgias and general weakness and malaise, nonprod cough, nausea without vomiting, low appetitie, and several episodes watery stool, no blood, but with somewhat crampy abd pains.  Has has mild increased sob/wheezing as well with re-start nebulizer at home for the first time in the past 2 days in several months.  Pt continues to have recurring LBP without change in severity, bowel or bladder change, fever, wt loss,  worsening LE pain/numbness/weakness, gait change or falls.  Pt denies new neurological symptoms such as new headache, or facial or extremity weakness or numbness  Pt denies chest pain, increased sob or doe, wheezing, orthopnea, PND, increased LE swelling, palpitations, dizziness or syncope except for the above.   Pt denies polydipsia, polyuria Past Medical History  Diagnosis Date  . HYPERLIPIDEMIA 09/19/2009  . ANXIETY 04/15/2010  . DEPRESSION 09/19/2009  . Acute angle-closure glaucoma 02/14/2010  . ALLERGIC RHINITIS 09/19/2009  . CHRONIC OBSTRUCTIVE PULMONARY DISEASE, ACUTE EXACERBATION 12/19/2009  . ASTHMA 09/19/2009  . COPD 06/27/2009  . BACK PAIN 02/14/2010  . TREMOR 02/14/2010  . HOARSENESS 10/17/2009  . Wheezing 06/27/2009  . MENOPAUSAL DISORDER 08/20/2010  . TINNITUS 09/10/2010  . URI 09/10/2010  . OSTEOPENIA 09/10/2010  . Chronic bronchitis 04/14/2011  . Rotator cuff tear 06/23/2011  . Tremor 07/30/2011   Past Surgical History  Procedure Date  . Normal heart cath 2001    Dr. Eden Emms  . Abdominal hysterectomy   . S/p laser tx for glaucoma     no vision loss    reports that she has been smoking Cigarettes.  She has a 20 pack-year smoking history. She has never used smokeless tobacco. She reports that she does not drink alcohol or use illicit drugs. family history includes Dementia in her  mother. Allergies  Allergen Reactions  . Pravastatin     REACTION: itch and leg cramp  . Simvastatin     REACTION: itch and leg cramps   Current Outpatient Prescriptions on File Prior to Visit  Medication Sig Dispense Refill  . albuterol (PROVENTIL) (2.5 MG/3ML) 0.083% nebulizer solution Take 3 mLs (2.5 mg total) by nebulization every 6 (six) hours as needed for wheezing.  75 mL  12  . aspirin 81 MG EC tablet Take 81 mg by mouth daily.        [name] atorvastatin (LIPITOR) 10 MG tablet Take 10 mg by mouth daily.        . budesonide-formoterol (SYMBICORT) 160-4.5 MCG/ACT inhaler Inhale 2 puffs into the lungs 2 (two) times daily.  1 Inhaler  11  . escitalopram (LEXAPRO) 10 MG tablet Take 1 by mouth daily      . fexofenadine (ALLEGRA) 180 MG tablet Take 180 mg by mouth daily.        . hydrochlorothiazide (,MICROZIDE/HYDRODIURIL,) 12.5 MG capsule Take 1 capsule (12.5 mg total) by mouth daily.  90 capsule  3  . SINGULAIR 10 MG tablet Take 1 by mouth daiy      . tiotropium (SPIRIVA HANDIHALER) 18 MCG inhalation capsule Place 1 capsule (18 mcg total) into inhaler and inhale daily.  30 capsule  12   ,Review of Systems Review of Systems  Constitutional: Negative for diaphoresis and unexpected weight change.  HENT: Negative for drooling and tinnitus.   Eyes: Negative for photophobia and visual disturbance.  Respiratory: Negative for choking and stridor.   Gastrointestinal: Negative for vomiting and blood in stool.  Genitourinary: Negative for hematuria and decreased urine volume.  Objective:   Physical Exam BP 120/80  Pulse 102  Temp(Src) 98.1 F (36.7 C) (Oral)  Ht 5\' 2"  (1.575 m)  Wt 111 lb 4 oz (50.463 kg)  BMI 20.35 kg/m2  SpO2 93% Physical Exam  VS noted, mild ill Constitutional: Pt appears well-developed and well-nourished.  HENT: Head: Normocephalic.  Right Ear: External ear normal.  Left Ear: External ear normal.  Bilat tm's mild erythema.  Sinus nontender.  Pharynx mild  erythema Eyes: Conjunctivae and EOM are normal. Pupils are equal, round, and reactive to light.  Neck: Normal range of motion. Neck supple.  Cardiovascular: Normal rate and regular rhythm.   Pulmonary/Chest: Effort normal and breath sounds mild decreased with mild bilat wheeze Neurological: Pt is alert. No cranial nerve deficit. motor/dtr intact to LE;s Spine nontender Skin: Skin is warm. No erythema.  Psychiatric: Pt behavior is normal. Thought content normal.     Assessment & Plan:

## 2011-08-03 NOTE — Assessment & Plan Note (Signed)
Chronic stable, for incr tramadol to 1.5 tabs q 6 hrs prn,  to f/u any worsening symptoms or concerns

## 2011-08-03 NOTE — Assessment & Plan Note (Signed)
Mild to mod, for predpack course,  to f/u any worsening symptoms or concerns, has pulm f/u jan 4

## 2011-08-05 LAB — SPEP & IFE WITH QIG
IgA: 211 mg/dL (ref 69–380)
IgG (Immunoglobin G), Serum: 531 mg/dL — ABNORMAL LOW (ref 690–1700)
Total Protein, Serum Electrophoresis: 6.1 g/dL (ref 6.0–8.3)

## 2011-08-05 LAB — METHYLMALONIC ACID, SERUM: Methylmalonic Acid, Quant: <0.09 umol/L (ref <0.40)

## 2011-08-06 ENCOUNTER — Other Ambulatory Visit (HOSPITAL_COMMUNITY): Payer: Medicare Other

## 2011-08-07 ENCOUNTER — Ambulatory Visit: Payer: Medicare Other | Admitting: Internal Medicine

## 2011-08-14 ENCOUNTER — Encounter: Payer: Self-pay | Admitting: Internal Medicine

## 2011-08-14 ENCOUNTER — Ambulatory Visit (INDEPENDENT_AMBULATORY_CARE_PROVIDER_SITE_OTHER): Payer: Medicare Other | Admitting: Internal Medicine

## 2011-08-14 VITALS — BP 110/70 | HR 117 | Temp 97.4°F | Ht 62.0 in | Wt 110.4 lb

## 2011-08-14 DIAGNOSIS — M545 Low back pain: Secondary | ICD-10-CM

## 2011-08-14 DIAGNOSIS — M542 Cervicalgia: Secondary | ICD-10-CM | POA: Insufficient documentation

## 2011-08-14 DIAGNOSIS — J449 Chronic obstructive pulmonary disease, unspecified: Secondary | ICD-10-CM

## 2011-08-14 DIAGNOSIS — F329 Major depressive disorder, single episode, unspecified: Secondary | ICD-10-CM

## 2011-08-14 MED ORDER — OXYCODONE HCL 5 MG PO TABS: ORAL_TABLET | ORAL | Status: DC

## 2011-08-14 MED ORDER — CYCLOBENZAPRINE HCL 5 MG PO TABS: 5.0000 mg | ORAL_TABLET | Freq: Three times a day (TID) | ORAL | Status: AC | PRN

## 2011-08-14 MED ORDER — PREDNISONE 10 MG PO TABS: ORAL_TABLET | ORAL | Status: DC

## 2011-08-14 NOTE — Assessment & Plan Note (Signed)
stable overall by hx and exam, most recent data reviewed with pt, and pt to continue medical treatment as before  Lab Results  Component Value Date   WBC 10.1 08/01/2011   HGB 14.6 08/01/2011   HCT 43.4 08/01/2011   PLT 312.0 08/01/2011   GLUCOSE 101* 08/01/2011   CHOL 191 09/16/2010   TRIG 68.0 09/16/2010   HDL 78.90 09/16/2010   LDLDIRECT 196.0 08/15/2010   LDLCALC 99 09/16/2010   ALT 22 08/01/2011   AST 23 08/01/2011   NA 141 08/01/2011   K 3.7 08/01/2011   CL 100 08/01/2011   CREATININE 0.6 08/01/2011   BUN 18 08/01/2011   CO2 31 08/01/2011   TSH 0.76 08/01/2011   HGBA1C 5.6 08/01/2011

## 2011-08-14 NOTE — Assessment & Plan Note (Signed)
stable overall by hx and exam, most recent data reviewed with pt, and pt to continue medical treatment as before  SpO2 Readings from Last 3 Encounters:  08/14/11 93%  07/30/11 93%  06/23/11 94%

## 2011-08-14 NOTE — Progress Notes (Signed)
Subjective:    Patient ID: Michele Martin, female    DOB: Sep 10, 1945, 66 y.o.   MRN: [id number]  HPI  Here to f/u with acute worsening neck pain, now severe pain, acutally worse yesterday and slightly better today but is extremely stiff and painful x 3 days start upon waking 3 days ago, seems "stuck" in 1 position where any extension/flexion or horizontal movement will make worse, located primarily to the bilat upper cervical area with some radiation to the occipital area, but not to the upper back, shoudlers or arms and has no clear radicular type pain.  Describes no change in balance or extremity weakness or numbness, no recent fall or other trauma, no fever or other HA.  No ST, sinus or ear symtpoms.  Pt denies chest pain, increased sob or doe, wheezing, orthopnea, PND, increased LE swelling, palpitations, dizziness or syncope.  Pt denies polydipsia, polyuria.  Denies worsening depressive symptoms, suicidal ideation, or panic, though has ongoing anxiety.  Past Medical History  Diagnosis Date  . HYPERLIPIDEMIA 09/19/2009  . ANXIETY 04/15/2010  . DEPRESSION 09/19/2009  . Acute angle-closure glaucoma 02/14/2010  . ALLERGIC RHINITIS 09/19/2009  . CHRONIC OBSTRUCTIVE PULMONARY DISEASE, ACUTE EXACERBATION 12/19/2009  . ASTHMA 09/19/2009  . COPD 06/27/2009  . BACK PAIN 02/14/2010  . TREMOR 02/14/2010  . HOARSENESS 10/17/2009  . Wheezing 06/27/2009  . MENOPAUSAL DISORDER 08/20/2010  . TINNITUS 09/10/2010  . URI 09/10/2010  . OSTEOPENIA 09/10/2010  . Chronic bronchitis 04/14/2011  . Rotator cuff tear 06/23/2011  . Tremor 07/30/2011   Past Surgical History  Procedure Date  . Normal heart cath 2001    Dr. Eden Emms  . Abdominal hysterectomy   . S/p laser tx for glaucoma     no vision loss    reports that she has been smoking Cigarettes.  She has a 20 pack-year smoking history. She has never used smokeless tobacco. She reports that she does not drink alcohol or use illicit drugs. family history includes  Dementia in her mother. Allergies  Allergen Reactions  . Pravastatin     REACTION: itch and leg cramp  . Simvastatin     REACTION: itch and leg cramps   Current Outpatient Prescriptions on File Prior to Visit  Medication Sig Dispense Refill  . albuterol (PROVENTIL) (2.5 MG/3ML) 0.083% nebulizer solution Take 3 mLs (2.5 mg total) by nebulization every 6 (six) hours as needed for wheezing.  75 mL  12  . aspirin 81 MG EC tablet Take 81 mg by mouth daily.        [name] atorvastatin (LIPITOR) 10 MG tablet Take 10 mg by mouth daily.        . budesonide-formoterol (SYMBICORT) 160-4.5 MCG/ACT inhaler Inhale 2 puffs into the lungs 2 (two) times daily.  1 Inhaler  11  . escitalopram (LEXAPRO) 10 MG tablet Take 1 by mouth daily      . fexofenadine (ALLEGRA) 180 MG tablet Take 180 mg by mouth daily.        . hydrochlorothiazide (,MICROZIDE/HYDRODIURIL,) 12.5 MG capsule Take 1 capsule (12.5 mg total) by mouth daily.  90 capsule  3  . SINGULAIR 10 MG tablet Take 1 by mouth daiy      . tiotropium (SPIRIVA HANDIHALER) 18 MCG inhalation capsule Place 1 capsule (18 mcg total) into inhaler and inhale daily.  30 capsule  12  . traMADol (ULTRAM) 50 MG tablet 1 and 1/2 tab by mouth four times daily as needed for pain  180 tablet  2  Review of Systems Review of Systems  Constitutional: Negative for diaphoresis and unexpected weight change.  HENT: Negative for drooling and tinnitus.   Eyes: Negative for photophobia and visual disturbance.  Respiratory: Negative for choking and stridor.   Gastrointestinal: Negative for vomiting and blood in stool.  Genitourinary: Negative for hematuria and decreased urine volume.  Musculoskeletal: Negative for gait problem. but cannot drive motor vehicle due to neck pain and stiffness Skin: Negative for color change and wound.  Neurological: Negative for tremors and numbness.    Objective:   Physical Exam BP 110/70  Pulse 117  Temp(Src) 97.4 F (36.3 C) (Oral)  Ht 5\' 2"   (1.575 m)  Wt 110 lb 6 oz (50.066 kg)  BMI 20.19 kg/m2  SpO2 93% Physical Exam  VS noted, not ill appearing, bright and alert but very uncomfortable with neck/head "frozen" in one position somewhat flexed looking straight ahead Constitutional: Pt is oriented to person, place, and time. Appears well-developed and well-nourished.  HENT:  Head: Normocephalic and atraumatic.  Right Ear: External ear normal.  Left Ear: External ear normal.  Nose: Nose normal.  Mouth/Throat: Oropharynx is clear and moist.  Eyes: Conjunctivae and EOM are normal. Pupils are equal, round, and reactive to light.  Neck: Normal range of motion. Neck stiff as above, most tender to palpate upper cervical bilat paravertebral areas and lower occipital, but spine palpation with little tender; No JVD present. No tracheal deviation present.  Cardiovascular: Normal rate, regular rhythm, normal heart sounds and intact distal pulses.   Pulmonary/Chest: Effort normal and breath sounds normal.  Abdominal: Soft. Bowel sounds are normal. There is no tenderness.  Musculoskeletal: Normal range of motion. Exhibits no edema.  Lymphadenopathy:  Has no cervical adenopathy.  Neurological: Pt is alert and oriented to person, place, and time. Pt has normal reflexes. No cranial nerve deficit. Motor/gait grossly intact  Skin: Skin is warm and dry. No rash noted.  Assessment & Plan:

## 2011-08-14 NOTE — Patient Instructions (Addendum)
Take all new medications as prescribed - the oxycodone, flexeril (muscle relaxer), and prednisone Continue all other medications as before Please keep your appointments with your specialists as you have planned - the MRI for the neck next wk

## 2011-08-14 NOTE — Assessment & Plan Note (Signed)
Unclear etiology, severe pain but little other change to PE; pt herself re-scheduled her MRI per Dr Modesto Charon to next wk and will cont to pursue this (was supposed to be tomorrow); ok for pain med, trial muscle relaxer and low dose repeat prednisone,  to f/u any worsening symptoms or concerns

## 2011-08-15 ENCOUNTER — Inpatient Hospital Stay (HOSPITAL_COMMUNITY): Admission: RE | Admit: 2011-08-15 | Payer: Medicare Other | Source: Ambulatory Visit

## 2011-08-15 ENCOUNTER — Other Ambulatory Visit: Payer: Self-pay | Admitting: Neurology

## 2011-08-15 DIAGNOSIS — R269 Unspecified abnormalities of gait and mobility: Secondary | ICD-10-CM

## 2011-08-15 DIAGNOSIS — G629 Polyneuropathy, unspecified: Secondary | ICD-10-CM

## 2011-08-15 NOTE — Progress Notes (Signed)
Scheduled NCV/EMG on 09/15/11 at 11:15. Patient aware.

## 2011-08-15 NOTE — Progress Notes (Signed)
ESR and CRP noted to be elevated.  I am going to get an EMG/NCS to look for axonal vs. demyelination to guide further workup.  Likely will need ANA and RF.  C-spine still pending.

## 2011-08-22 ENCOUNTER — Ambulatory Visit (HOSPITAL_COMMUNITY)
Admission: RE | Admit: 2011-08-22 | Discharge: 2011-08-22 | Disposition: A | Discharge status: Home / Self Care | Payer: Medicare Other | Source: Ambulatory Visit | Attending: Neurology | Admitting: Neurology

## 2011-08-22 DIAGNOSIS — M4802 Spinal stenosis, cervical region: Secondary | ICD-10-CM | POA: Insufficient documentation

## 2011-08-22 DIAGNOSIS — Q762 Congenital spondylolisthesis: Secondary | ICD-10-CM | POA: Insufficient documentation

## 2011-08-22 DIAGNOSIS — M542 Cervicalgia: Secondary | ICD-10-CM | POA: Insufficient documentation

## 2011-08-25 ENCOUNTER — Telehealth: Payer: Self-pay | Admitting: Internal Medicine

## 2011-08-25 ENCOUNTER — Other Ambulatory Visit: Payer: Self-pay | Admitting: Neurology

## 2011-08-25 ENCOUNTER — Ambulatory Visit: Payer: Medicare Other | Admitting: Internal Medicine

## 2011-08-25 DIAGNOSIS — G959 Disease of spinal cord, unspecified: Secondary | ICD-10-CM

## 2011-08-25 DIAGNOSIS — M542 Cervicalgia: Secondary | ICD-10-CM

## 2011-08-25 NOTE — Telephone Encounter (Signed)
Message copied by Corwin Levins on Mon Aug 25, 2011  5:24 PM ------      Message from: Denton Meek H      Created: Mon Aug 25, 2011  4:08 PM       Hi Jan  Could you arrange an urgent appt with Kerri Perches for Ms. Sammon for cervical myelopathy.  Thx.  Matt

## 2011-08-25 NOTE — Telephone Encounter (Signed)
Hi Michele Martin.  We are already taking care of it.  It was just an FYI for you.  I have let Dr. Venetia Maxon know directly as well.  Michele Martin

## 2011-08-25 NOTE — Telephone Encounter (Signed)
MRI with mod to severe spinal stenosis with "mass effect" of the spinal cord  To refer Dr Kerri Perches, urgent for above/cervical myelpathy  Robin to notify pt

## 2011-08-26 ENCOUNTER — Other Ambulatory Visit (INDEPENDENT_AMBULATORY_CARE_PROVIDER_SITE_OTHER): Payer: Medicare Other

## 2011-08-26 DIAGNOSIS — J449 Chronic obstructive pulmonary disease, unspecified: Secondary | ICD-10-CM

## 2011-08-26 DIAGNOSIS — M542 Cervicalgia: Secondary | ICD-10-CM

## 2011-08-26 DIAGNOSIS — Z79899 Other long term (current) drug therapy: Secondary | ICD-10-CM

## 2011-08-26 DIAGNOSIS — F329 Major depressive disorder, single episode, unspecified: Secondary | ICD-10-CM

## 2011-08-26 DIAGNOSIS — Z0000 Encounter for general adult medical examination without abnormal findings: Secondary | ICD-10-CM

## 2011-08-26 LAB — CBC WITH DIFFERENTIAL/PLATELET
Basophils Absolute: 0 10*3/uL (ref 0.0–0.1)
Eosinophils Absolute: 0.2 10*3/uL (ref 0.0–0.7)
HCT: 45.2 % (ref 36.0–46.0)
Hemoglobin: 15.3 g/dL — ABNORMAL HIGH (ref 12.0–15.0)
Lymphocytes Relative: 15.2 % (ref 12.0–46.0)
Lymphs Abs: 1.3 10*3/uL (ref 0.7–4.0)
MCHC: 33.8 g/dL (ref 30.0–36.0)
Neutro Abs: 6.4 10*3/uL (ref 1.4–7.7)
Platelets: 236 10*3/uL (ref 150.0–400.0)
RDW: 13 % (ref 11.5–14.6)

## 2011-08-26 LAB — BASIC METABOLIC PANEL
CO2: 32 mEq/L (ref 19–32)
Calcium: 9.3 mg/dL (ref 8.4–10.5)
Potassium: 3.6 mEq/L (ref 3.5–5.1)
Sodium: 144 mEq/L (ref 135–145)

## 2011-08-26 LAB — HEPATIC FUNCTION PANEL
AST: 20 U/L (ref 0–37)
Alkaline Phosphatase: 55 U/L (ref 39–117)
Bilirubin, Direct: 0.1 mg/dL (ref 0.0–0.3)
Total Protein: 6.7 g/dL (ref 6.0–8.3)

## 2011-08-26 LAB — LIPID PANEL
HDL: 71.9 mg/dL (ref >39.00)
Total CHOL/HDL Ratio: 3

## 2011-08-26 LAB — URINALYSIS, ROUTINE W REFLEX MICROSCOPIC
Hgb urine dipstick: NEGATIVE
Ketones, ur: NEGATIVE
Urine Glucose: NEGATIVE
Urobilinogen, UA: 0.2 (ref 0.0–1.0)

## 2011-08-26 NOTE — Telephone Encounter (Signed)
Patient informed and will discuss results with Dr. Jonny Ruiz on Friday Feb. 1, 2013 at her OV.

## 2011-08-27 ENCOUNTER — Telehealth: Payer: Self-pay | Admitting: Neurology

## 2011-08-27 NOTE — Progress Notes (Signed)
Spoke with Michele Martin yesterday at Dr. Fredrich Birks office. Patient has an appointment with Dr. Venetia Maxon on 02/06 unless he feels the need to see her sooner after he reviews her records. Dr. Modesto Charon aware of this appointment.

## 2011-08-27 NOTE — Telephone Encounter (Signed)
Called and spoke with the patient. Informed of appointment with Dr. Venetia Maxon on February 6th at 1200 pm; arrive at 1130 am. No other issues voiced.

## 2011-08-29 ENCOUNTER — Encounter: Payer: Self-pay | Admitting: Internal Medicine

## 2011-08-29 ENCOUNTER — Other Ambulatory Visit: Payer: Self-pay

## 2011-08-29 ENCOUNTER — Ambulatory Visit (INDEPENDENT_AMBULATORY_CARE_PROVIDER_SITE_OTHER): Payer: Medicare Other | Admitting: Internal Medicine

## 2011-08-29 VITALS — BP 120/80 | HR 93 | Temp 97.0°F | Ht 62.0 in | Wt 113.0 lb

## 2011-08-29 DIAGNOSIS — J42 Unspecified chronic bronchitis: Secondary | ICD-10-CM

## 2011-08-29 DIAGNOSIS — M542 Cervicalgia: Secondary | ICD-10-CM

## 2011-08-29 DIAGNOSIS — Z0000 Encounter for general adult medical examination without abnormal findings: Secondary | ICD-10-CM

## 2011-08-29 DIAGNOSIS — J449 Chronic obstructive pulmonary disease, unspecified: Secondary | ICD-10-CM

## 2011-08-29 MED ORDER — ATORVASTATIN CALCIUM 10 MG PO TABS: 10.0000 mg | ORAL_TABLET | Freq: Every day | ORAL | Status: DC

## 2011-08-29 NOTE — Assessment & Plan Note (Signed)
For f/u Dr Venetia Maxon next wk as planned, suspect she will need c-spine fusion for stabilization, but should see Dr Ocie Doyne prior for pulm claerance

## 2011-08-29 NOTE — Patient Instructions (Signed)
Continue all other medications as before You will be contacted regarding the referral for: Dr Sherene Sires Please keep your appointments with your specialists as you have planned - Dr Venetia Maxon Please return in 6 months, or sooner if needed

## 2011-08-29 NOTE — Assessment & Plan Note (Signed)
Overall doing well, age appropriate education and counseling updated, referrals for preventative services and immunizations addressed, dietary and smoking counseling addressed, most recent labs and ECG reviewed.  I have personally reviewed and have noted: 1) the patient's medical and social history 2) The pt's use of alcohol, tobacco, and illicit drugs 3) The patient's current medications and supplements 4) Functional ability including ADL's, fall risk, home safety risk, hearing and visual impairment 5) Diet and physical activities 6) Evidence for depression or mood disorder 7) The patient's height, weight, and BMI have been recorded in the chart I have made referrals, and provided counseling and education based on review of the above [facility] colonoscopy

## 2011-08-30 ENCOUNTER — Encounter: Payer: Self-pay | Admitting: Internal Medicine

## 2011-08-30 NOTE — Progress Notes (Signed)
Subjective:    Patient ID: Michele Martin, female    DOB: 1945/10/18, 66 y.o.   MRN: [id number]  HPI  Here for wellness and f/u;  Overall doing ok;  Pt denies CP, worsening SOB, DOE, wheezing, orthopnea, PND, worsening LE edema, palpitations, dizziness or syncope.  Pt denies neurological change such as new Headache, facial or extremity weakness.  Pt denies polydipsia, polyuria, or low sugar symptoms. Pt states overall good compliance with treatment and medications, good tolerability, and trying to follow lower cholesterol diet.  Pt denies worsening depressive symptoms, suicidal ideation or panic. No fever, wt loss, night sweats, loss of appetite, or other constitutional symptoms.  Pt states good ability with ADL's, low fall risk, home safety reviewed and adequate, no significant changes in hearing or vision, and occasionally active with exercise.  Did have recent c-spine MRI with marked spinal stenosis, pain much improved by today, has appt with Dr Venetia Maxon next wk. Past Medical History  Diagnosis Date  . HYPERLIPIDEMIA 09/19/2009  . ANXIETY 04/15/2010  . DEPRESSION 09/19/2009  . Acute angle-closure glaucoma 02/14/2010  . ALLERGIC RHINITIS 09/19/2009  . CHRONIC OBSTRUCTIVE PULMONARY DISEASE, ACUTE EXACERBATION 12/19/2009  . ASTHMA 09/19/2009  . COPD 06/27/2009  . BACK PAIN 02/14/2010  . TREMOR 02/14/2010  . HOARSENESS 10/17/2009  . Wheezing 06/27/2009  . MENOPAUSAL DISORDER 08/20/2010  . TINNITUS 09/10/2010  . URI 09/10/2010  . OSTEOPENIA 09/10/2010  . Chronic bronchitis 04/14/2011  . Rotator cuff tear 06/23/2011  . Tremor 07/30/2011   Past Surgical History  Procedure Date  . Normal heart cath 2001    Dr. Eden Emms  . Abdominal hysterectomy   . S/p laser tx for glaucoma     no vision loss    reports that she has been smoking Cigarettes.  She has a 20 pack-year smoking history. She has never used smokeless tobacco. She reports that she does not drink alcohol or use illicit drugs. family history includes  Dementia in her mother. Allergies  Allergen Reactions  . Pravastatin     REACTION: itch and leg cramp  . Simvastatin     REACTION: itch and leg cramps   Current Outpatient Prescriptions on File Prior to Visit  Medication Sig Dispense Refill  . albuterol (PROVENTIL) (2.5 MG/3ML) 0.083% nebulizer solution Take 3 mLs (2.5 mg total) by nebulization every 6 (six) hours as needed for wheezing.  75 mL  12  . aspirin 81 MG EC tablet Take 81 mg by mouth daily.        . budesonide-formoterol (SYMBICORT) 160-4.5 MCG/ACT inhaler Inhale 2 puffs into the lungs 2 (two) times daily.  1 Inhaler  11  . escitalopram (LEXAPRO) 10 MG tablet Take 1 by mouth daily      . fexofenadine (ALLEGRA) 180 MG tablet Take 180 mg by mouth daily.        . hydrochlorothiazide (,MICROZIDE/HYDRODIURIL,) 12.5 MG capsule Take 1 capsule (12.5 mg total) by mouth daily.  90 capsule  3  . oxyCODONE (OXY IR/ROXICODONE) 5 MG immediate release tablet 1 - 2 tabs by mouth every 6 hrs as needed for pain  60 tablet  0  . SINGULAIR 10 MG tablet Take 1 by mouth daiy      . tiotropium (SPIRIVA HANDIHALER) 18 MCG inhalation capsule Place 1 capsule (18 mcg total) into inhaler and inhale daily.  30 capsule  12  . traMADol (ULTRAM) 50 MG tablet 1 and 1/2 tab by mouth four times daily as needed for pain  180 tablet  2  . predniSONE (DELTASONE) 10 MG tablet 1 tab by mouth per day  7 tablet  0   Review of Systems Review of Systems  Constitutional: Negative for diaphoresis, activity change, appetite change and unexpected weight change.  HENT: Negative for hearing loss, ear pain, facial swelling, mouth sores and neck stiffness.   Eyes: Negative for pain, redness and visual disturbance.  Respiratory: Negative for shortness of breath and wheezing.   Cardiovascular: Negative for chest pain and palpitations.  Gastrointestinal: Negative for diarrhea, blood in stool, abdominal distention and rectal pain.  Genitourinary: Negative for hematuria, flank pain  and decreased urine volume.  Musculoskeletal: Negative for myalgias and joint swelling.  Skin: Negative for color change and wound.  Neurological: Negative for syncope and numbness.  Hematological: Negative for adenopathy.  Psychiatric/Behavioral: Negative for hallucinations, self-injury, decreased concentration and agitation.      Objective:   Physical Exam BP 120/80  Pulse 93  Temp(Src) 97 F (36.1 C) (Oral)  Ht 5\' 2"  (1.575 m)  Wt 113 lb (51.256 kg)  BMI 20.67 kg/m2  SpO2 94% Physical Exam  VS noted Constitutional: Pt is oriented to person, place, and time. Appears well-developed and well-nourished.  HENT:  Head: Normocephalic and atraumatic.  Right Ear: External ear normal.  Left Ear: External ear normal.  Nose: Nose normal.  Mouth/Throat: Oropharynx is clear and moist.  Eyes: Conjunctivae and EOM are normal. Pupils are equal, round, and reactive to light.  Neck: Normal range of motion. Neck supple. No JVD present. No tracheal deviation present.  Cardiovascular: Normal rate, regular rhythm, normal heart sounds and intact distal pulses.   Pulmonary/Chest: Effort normal and breath sounds mild decreased, no wheezing  Abdominal: Soft. Bowel sounds are normal. There is no tenderness.  Musculoskeletal: Normal range of motion. Exhibits no edema.  Lymphadenopathy:  Has no cervical adenopathy.  Neurological: Pt is alert and oriented to person, place, and time. Pt has normal reflexes. No cranial nerve deficit.  Skin: Skin is warm and dry. No rash noted.  Psychiatric:  Has  normal mood and affect. Behavior is normal.     Assessment & Plan:

## 2011-08-30 NOTE — Assessment & Plan Note (Signed)
stable overall by hx and exam, , and pt to continue medical treatment as before   

## 2011-09-01 ENCOUNTER — Ambulatory Visit (INDEPENDENT_AMBULATORY_CARE_PROVIDER_SITE_OTHER): Payer: Medicare Other | Admitting: Internal Medicine

## 2011-09-01 ENCOUNTER — Encounter: Payer: Self-pay | Admitting: Internal Medicine

## 2011-09-01 VITALS — BP 100/66 | HR 80 | Temp 98.6°F | Ht 62.0 in | Wt 113.8 lb

## 2011-09-01 DIAGNOSIS — F17200 Nicotine dependence, unspecified, uncomplicated: Secondary | ICD-10-CM

## 2011-09-01 DIAGNOSIS — J449 Chronic obstructive pulmonary disease, unspecified: Secondary | ICD-10-CM

## 2011-09-01 NOTE — Patient Instructions (Addendum)
Work on Chemical engineer technique:  relax and gently blow all the way out then take a nice smooth deep breath back in, triggering the inhaler at same time you start breathing in.  Hold for up to 5 seconds if you can.  Rinse and gargle with water when done   If your mouth or throat starts to bother you,   I suggest you time the inhaler to your dental care and after using the inhaler(s) brush teeth and tongue with a baking soda containing toothpaste and when you rinse this out, gargle with it first to see if this helps your mouth and throat.     Only use your albuterol as a rescue medication to be used if you can't catch your breath by resting or doing a relaxed purse lip breathing pattern. The less you use it, the better it will work when you need it.   You are very high risk for surgery and I recommend you put if off for at least a month if possible and return here for final preop eval with a family member with you if possible  Congratulations on not smoking - it's the most important aspect of your care!!

## 2011-09-01 NOTE — Progress Notes (Signed)
  Subjective:    Patient ID: Michele Martin, female    DOB: 1945/10/27, 66 y.o.   MRN: [id number]  Brief patient profile:  84 yowf h/o smoking referred to pulmonary clinic 04/2011 by Dr Jonny Ruiz for eval of sob  05/20/2011 Initial pulmonary office eval cc sob x "many years" indolent onset and quit variable = much better on symbicort and spiriva  mostly with humidity but when the weather's cool can do anything she wants x for heavy yardwork. Also using neb qid  Not prn.  rec You must stop smoking before smoking stops you Symbicort 160 Take 2 puffs first thing in am and then another 2 puffs about 12 hours later.  and chase the am dose of symbicort with spiriva Only use your albuterol (not duoneb, though ok to use it up) as a rescue medication  09/01/2011 f/u ov/Hansen Carino quit smoking 1 week prior to OV  Cc no change doe very little need for neb x for "with the flu".  For surgery by Dr Caleb Popp ? Spinal stenosis but needs clearance.  Can do Karin Golden but no mall. Congested cough each am x sev tbsp of mucoid sputum.   Sleeping ok without nocturnal    exacerbation  of respiratory  c/o's or need for noct saba but does have some cough and congestion each am = basline.  No recent flares/ exacerbation.s  Also denies any obvious fluctuation of symptoms with weather or environmental changes or other aggravating or alleviating factors except as outlined above.  No assoc ex cp, itching sneezing or wheezing.  ROS  At present neg for  any significant sore throat, dysphagia, itching, sneezing,  nasal congestion or excess/ purulent secretions,  fever, chills, sweats, unintended wt loss, pleuritic or exertional cp, hempoptysis, orthopnea pnd or leg swelling.  Also denies presyncope, palpitations, heartburn, abdominal pain, nausea, vomiting, diarrhea  or change in bowel or urinary habits, dysuria,hematuria,  rash, arthralgias, visual complaints, headache, numbness weakness or ataxia.            Objective:   Physical Exam  Wt 116 05/20/2011 > 113 09/01/2011  Stoic amb wf who tends to downplay her symptoms dramatically, moderately congested/ rattling sounding cough HEENT mild turbinate edema.  Oropharynx no thrush or excess pnd or cobblestoning.  No JVD or cervical adenopathy. Mild accessory muscle hypertrophy. Trachea midline, nl thryroid. Chest was hyperinflated by percussion with diminished breath sounds and moderate increased exp time without wheeze. Hoover sign positive at mid inspiration. Regular rate and rhythm without murmur gallop or rub or increase P2 or edema.  Abd: no hsm, nl excursion. Ext warm without cyanosis or clubbing.    cxr 03/14/11 COPD/chronic bronchitis. No acute superimposed process.     Assessment & Plan:

## 2011-09-02 NOTE — Assessment & Plan Note (Addendum)
-   Spirometry   09/01/2011   FEV1  0.74 (35%) ratio 50 - bicarb levels 32 08/26/11 c/w mild hypercarbia   I had an extended discussion with the patient today lasting 15 to 20 minutes of a 25 minute visit on the following issues:   She has GOLD III/IV copd, chronic bronchitis with acquired mucociliary dysfunction, and is very high though not prohibitive risk for elective back surgery.  She can be cleared for surgery if stops smoking for minimum of 2 weeks , ideally 6 weeks, and has a reasonable contingency plan supported by family re eol issues as she does not want to become vent dependent under any circumstances.  See instructions for specific recommendations which were reviewed directly with the patient who was given a copy with highlighter outlining the key components.

## 2011-09-02 NOTE — Assessment & Plan Note (Signed)
>   3 min discussion  Reviewed all the data we have re recovery of MC function preop so it's critical she maintain off cigs minimum of 2 weeks, ideally 6 weeks preop

## 2011-09-03 ENCOUNTER — Other Ambulatory Visit: Payer: Self-pay | Admitting: Neurosurgery

## 2011-09-17 ENCOUNTER — Ambulatory Visit: Payer: Medicare Other | Admitting: Neurology

## 2011-10-03 ENCOUNTER — Ambulatory Visit (INDEPENDENT_AMBULATORY_CARE_PROVIDER_SITE_OTHER): Payer: Medicare Other | Admitting: Internal Medicine

## 2011-10-03 ENCOUNTER — Encounter: Payer: Self-pay | Admitting: Internal Medicine

## 2011-10-03 DIAGNOSIS — J449 Chronic obstructive pulmonary disease, unspecified: Secondary | ICD-10-CM

## 2011-10-03 DIAGNOSIS — M75100 Unspecified rotator cuff tear or rupture of unspecified shoulder, not specified as traumatic: Secondary | ICD-10-CM

## 2011-10-03 DIAGNOSIS — S43429A Sprain of unspecified rotator cuff capsule, initial encounter: Secondary | ICD-10-CM

## 2011-10-03 NOTE — Progress Notes (Signed)
Subjective:    Patient ID: Michele Martin, female    DOB: [birth year]    MRN: [id number]  Brief patient profile:  59 yowf h/o smoking referred to pulmonary clinic 04/2011 by Dr Michele Martin for eval of sob.  05/20/2011 Initial pulmonary office eval cc sob x "many years" indolent onset and quit variable = much better on symbicort and spiriva  mostly with humidity but when the weather's cool can do anything she wants x for heavy yardwork. Also using neb qid  Not prn.  rec You must stop smoking before smoking stops you Symbicort 160 Take 2 puffs first thing in am and then another 2 puffs about 12 hours later.  and chase the am dose of symbicort with spiriva Only use your albuterol (not duoneb, though ok to use it up) as a rescue medication  09/01/2011 f/u ov/Michele Martin quit smoking 1 week prior to OV  Cc no change doe very little need for neb x for "with the flu".  For surgery by Dr Michele Martin ? Spinal stenosis but needs clearance.  Can do Karin Golden but no mall. Congested cough each am x sev tbsp of mucoid sputum.   Sleeping ok without nocturnal    exacerbation  of respiratory  c/o's or need for noct saba but does have some cough and congestion each am = basline.  No recent flares/ exacerbations. rec Work on Archivist:    Only use your albuterol as a rescue medication to be used if you can't catch your breath by resting or doing a relaxed purse lip breathing pattern. The less you use it, the better it will work when you need it.  You are very high risk for surgery and I recommend you put if off for at least a month if possible and return here for final preop eval with a family member with you if possible Congratulations on not smoking - it's the most important aspect of your care!!   10/03/2011 f/u ov/Michele Martin last cigarette around 1st Feb 2013 and overall much better breathing on symbicort and spiriva, no cough or daytime need for rescue saba. Not limited by sob but not very active. No sign  cough.  Sleeping ok without nocturnal  or early am exacerbation  of respiratory  c/o's or need for noct saba. Also denies any obvious fluctuation of symptoms with weather or environmental changes or other aggravating or alleviating factors except as outlined above   ROS  At present neg for  any significant sore throat, dysphagia, itching, sneezing,  nasal congestion or excess/ purulent secretions,  fever, chills, sweats, unintended wt loss, pleuritic or exertional cp, hempoptysis, orthopnea pnd or leg swelling.  Also denies presyncope, palpitations, heartburn, abdominal pain, nausea, vomiting, diarrhea  or change in bowel or urinary habits, dysuria,hematuria,  rash, arthralgias, visual complaints, headache, numbness weakness or ataxia.            Objective:   Physical Exam  Wt 116 05/20/2011 > 113 09/01/2011 > 10/03/2011  121 Stoic amb wf who tends to downplay her symptoms dramatically, moderately congested/ rattling sounding cough HEENT mild turbinate edema.  Oropharynx no thrush or excess pnd or cobblestoning.  No JVD or cervical adenopathy. Mild accessory muscle hypertrophy. Trachea midline, nl thryroid. Chest was hyperinflated by percussion with diminished breath sounds and moderate increased exp time without wheeze. Hoover sign positive at mid inspiration. Regular rate and rhythm without murmur gallop or rub or increase P2 or edema.  Abd: no hsm, nl excursion. Ext warm without  cyanosis or clubbing.    cxr 03/14/11 COPD/chronic bronchitis. No acute superimposed process.     Assessment & Plan:

## 2011-10-03 NOTE — Patient Instructions (Signed)
You are cleared for the surgery but need to maintain off the cigarettes at all costs  Work on inhaler technique:  relax and gently blow all the way out then take a nice smooth deep breath back in, triggering the inhaler at same time you start breathing in.  Hold for up to 5 seconds if you can.  Rinse and gargle with water when done   If your mouth or throat starts to bother you,   I suggest you time the inhaler to your dental care and after using the inhaler(s) brush teeth and tongue with a baking soda containing toothpaste and when you rinse this out, gargle with it first to see if this helps your mouth and throat.

## 2011-10-03 NOTE — Assessment & Plan Note (Addendum)
-   Spirometry   09/01/2011   FEV1  0.74 (35%) ratio 50 so she is GOLD III - bicarb levels 32 08/26/11 c/w mild hypercarbia  Adequate control on present rx, reviewed rx in detail. See instructions for specific recommendations which were reviewed directly with the patient who was given a copy with highlighter outlining the key components.   The proper method of use, as well as anticipated side effects, of this metered-dose inhaler are discussed and demonstrated to the patient. Improved to 75% with coaching  Discussed in detail all the  indications, usual  risks and alternatives  relative to the benefits with patient who agrees to proceed with neck surgery with acceptable risk of pulmonary complications with the key = no smoking between now and then and call if any new cough or sob.

## 2011-10-04 NOTE — Assessment & Plan Note (Signed)
Discussed in detail all the  indications, usual  risks and alternatives  relative to the benefits with patient who agrees to proceed with shoulder surgery with acceptable risk of pulmonary complications with the key = no smoking between now and then and call if any new cough or sob.

## 2011-10-14 ENCOUNTER — Encounter (HOSPITAL_COMMUNITY): Payer: Self-pay | Admitting: Pharmacy Technician

## 2011-10-14 MED ORDER — MORPHINE SULFATE (PF) 1 MG/ML IV SOLN
INTRAVENOUS | Status: AC
  Filled 2011-10-14: qty 25

## 2011-10-16 NOTE — Pre-Procedure Instructions (Signed)
20 Michele Martin  10/16/2011   Your procedure is scheduled on:  Tuesday October 21, 2011  Report to [facility] at 0530 AM.  Call this number if you have problems the morning of surgery: [phone]   Remember:   Do not eat food:After Midnight.  May have clear liquids: up to 4 Hours before arrival until 0130 am.  Clear liquids include soda, tea, black coffee, apple or grape juice, broth.  Take these medicines the morning of surgery with A SIP OF WATER: Albuterol,  Symbicort, and Spiriva inhalers, escitalopram, and Tramadol if needed for pain.   Do not wear jewelry, make-up or nail polish.  Do not wear lotions, powders, or perfumes. You may wear deodorant.  Do not shave 48 hours prior to surgery.  Do not bring valuables to the hospital.  Contacts, dentures or bridgework may not be worn into surgery.  Leave suitcase in the car. After surgery it may be brought to your room.  For patients admitted to the hospital, checkout time is 11:00 AM the day of discharge.   Patients discharged the day of surgery will not be allowed to drive home.  Name and phone number of your driver:   Special Instructions: CHG Shower Use Special Wash: 1/2 bottle night before surgery and 1/2 bottle morning of surgery.   Please read over the following fact sheets that you were given: Pain Booklet, Coughing and Deep Breathing and Surgical Site Infection Prevention

## 2011-10-17 ENCOUNTER — Ambulatory Visit (HOSPITAL_COMMUNITY)
Admission: RE | Admit: 2011-10-17 | Discharge: 2011-10-17 | Disposition: A | Discharge status: Home / Self Care | Payer: Medicare Other | Source: Ambulatory Visit | Attending: Anesthesiology | Admitting: Anesthesiology

## 2011-10-17 ENCOUNTER — Encounter (HOSPITAL_COMMUNITY): Payer: Self-pay

## 2011-10-17 ENCOUNTER — Encounter (HOSPITAL_COMMUNITY)
Admission: RE | Admit: 2011-10-17 | Discharge: 2011-10-17 | Disposition: A | Discharge status: Home / Self Care | Payer: Medicare Other | Source: Ambulatory Visit | Attending: Neurosurgery | Admitting: Neurosurgery

## 2011-10-17 DIAGNOSIS — Z01818 Encounter for other preprocedural examination: Secondary | ICD-10-CM | POA: Insufficient documentation

## 2011-10-17 HISTORY — DX: Unspecified osteoarthritis, unspecified site: M19.90

## 2011-10-17 LAB — CBC
HCT: 42.8 % (ref 36.0–46.0)
Hemoglobin: 14.6 g/dL (ref 12.0–15.0)
MCH: 31.7 pg (ref 26.0–34.0)
MCHC: 34.1 g/dL (ref 30.0–36.0)
RDW: 12.5 % (ref 11.5–15.5)

## 2011-10-17 LAB — BASIC METABOLIC PANEL
BUN: 13 mg/dL (ref 6–23)
Calcium: 9.6 mg/dL (ref 8.4–10.5)
Creatinine, Ser: 0.53 mg/dL (ref 0.50–1.10)
GFR calc non Af Amer: >90 mL/min (ref >90)
Glucose, Bld: 88 mg/dL (ref 70–99)

## 2011-10-17 NOTE — Pre-Procedure Instructions (Signed)
20 Michele Martin  10/17/2011   Your procedure is scheduled on:  MARCH 26  Report to [facility] at 5:30 AM.  Call this number if you have problems the morning of surgery: [phone]   Remember:   Do not eat food:After Midnight.  May have clear liquids: up to 4 Hours before arrival.  Clear liquids include soda, tea, black coffee, apple or grape juice, broth.  Take these medicines the morning of surgery with A SIP OF WATER: INHALER, ALLEGRA,LEXAPRO,PAIN PILL AS NEEDED   Do not wear jewelry, make-up or nail polish.  Do not wear lotions, powders, or perfumes. You may wear deodorant.  Do not shave 48 hours prior to surgery.  Do not bring valuables to the hospital.  Contacts, dentures or bridgework may not be worn into surgery.  Leave suitcase in the car. After surgery it may be brought to your room.  For patients admitted to the hospital, checkout time is 11:00 AM the day of discharge.   Patients discharged the day of surgery will not be allowed to drive home.  Name and phone number of your driver: NA  Special Instructions: CHG Shower Use Special Wash: 1/2 bottle night before surgery and 1/2 bottle morning of surgery.   Please read over the following fact sheets that you were given: Pain Booklet, MRSA Information and Surgical Site Infection Prevention

## 2011-10-20 MED ORDER — CEFAZOLIN SODIUM-DEXTROSE 2-3 GM-% IV SOLR
2.0000 g | INTRAVENOUS | Status: AC
  Administered 2011-10-21: 2 g via INTRAVENOUS
  Filled 2011-10-20: qty 50

## 2011-10-21 ENCOUNTER — Encounter (HOSPITAL_COMMUNITY): Payer: Self-pay | Admitting: Certified Registered"

## 2011-10-21 ENCOUNTER — Inpatient Hospital Stay (HOSPITAL_COMMUNITY)
Admission: RE | Admit: 2011-10-21 | Discharge: 2011-10-23 | DRG: 473 | Disposition: A | Discharge status: Home Health | Payer: Medicare Other | Source: Ambulatory Visit | Attending: Neurosurgery | Admitting: Neurosurgery

## 2011-10-21 ENCOUNTER — Inpatient Hospital Stay (HOSPITAL_COMMUNITY): Payer: Medicare Other | Admitting: Certified Registered"

## 2011-10-21 ENCOUNTER — Encounter (HOSPITAL_COMMUNITY): Payer: Self-pay | Admitting: Surgery

## 2011-10-21 ENCOUNTER — Encounter (HOSPITAL_COMMUNITY)
Admission: RE | Disposition: A | Discharge status: Home Health | Payer: Self-pay | Source: Ambulatory Visit | Attending: Neurosurgery

## 2011-10-21 ENCOUNTER — Inpatient Hospital Stay (HOSPITAL_COMMUNITY): Payer: Medicare Other

## 2011-10-21 DIAGNOSIS — J449 Chronic obstructive pulmonary disease, unspecified: Secondary | ICD-10-CM | POA: Diagnosis present

## 2011-10-21 DIAGNOSIS — M4802 Spinal stenosis, cervical region: Secondary | ICD-10-CM | POA: Diagnosis present

## 2011-10-21 DIAGNOSIS — J4489 Other specified chronic obstructive pulmonary disease: Secondary | ICD-10-CM | POA: Diagnosis present

## 2011-10-21 DIAGNOSIS — Z780 Asymptomatic menopausal state: Secondary | ICD-10-CM

## 2011-10-21 DIAGNOSIS — F3289 Other specified depressive episodes: Secondary | ICD-10-CM | POA: Diagnosis present

## 2011-10-21 DIAGNOSIS — Z87891 Personal history of nicotine dependence: Secondary | ICD-10-CM

## 2011-10-21 DIAGNOSIS — M899 Disorder of bone, unspecified: Secondary | ICD-10-CM | POA: Diagnosis present

## 2011-10-21 DIAGNOSIS — Z01812 Encounter for preprocedural laboratory examination: Secondary | ICD-10-CM

## 2011-10-21 DIAGNOSIS — R609 Edema, unspecified: Secondary | ICD-10-CM

## 2011-10-21 DIAGNOSIS — E785 Hyperlipidemia, unspecified: Secondary | ICD-10-CM | POA: Diagnosis present

## 2011-10-21 DIAGNOSIS — M545 Low back pain, unspecified: Secondary | ICD-10-CM | POA: Diagnosis present

## 2011-10-21 DIAGNOSIS — M5020 Other cervical disc displacement, unspecified cervical region: Secondary | ICD-10-CM | POA: Diagnosis present

## 2011-10-21 DIAGNOSIS — R0902 Hypoxemia: Secondary | ICD-10-CM

## 2011-10-21 DIAGNOSIS — Z7982 Long term (current) use of aspirin: Secondary | ICD-10-CM

## 2011-10-21 DIAGNOSIS — M4712 Other spondylosis with myelopathy, cervical region: Principal | ICD-10-CM | POA: Diagnosis present

## 2011-10-21 DIAGNOSIS — F411 Generalized anxiety disorder: Secondary | ICD-10-CM | POA: Diagnosis present

## 2011-10-21 DIAGNOSIS — Z90710 Acquired absence of both cervix and uterus: Secondary | ICD-10-CM

## 2011-10-21 DIAGNOSIS — J441 Chronic obstructive pulmonary disease with (acute) exacerbation: Secondary | ICD-10-CM

## 2011-10-21 DIAGNOSIS — F329 Major depressive disorder, single episode, unspecified: Secondary | ICD-10-CM | POA: Diagnosis present

## 2011-10-21 DIAGNOSIS — Z888 Allergy status to other drugs, medicaments and biological substances status: Secondary | ICD-10-CM

## 2011-10-21 HISTORY — DX: Other complications of anesthesia, initial encounter: T88.59XA

## 2011-10-21 HISTORY — DX: Adverse effect of unspecified anesthetic, initial encounter: T41.45XA

## 2011-10-21 SURGERY — ANTERIOR CERVICAL DECOMPRESSION/DISCECTOMY FUSION 4 LEVELS
Anesthesia: General | Site: Spine Cervical | Wound class: Clean

## 2011-10-21 MED ORDER — ALBUTEROL SULFATE (5 MG/ML) 0.5% IN NEBU
INHALATION_SOLUTION | RESPIRATORY_TRACT | Status: AC
  Filled 2011-10-21: qty 0.5

## 2011-10-21 MED ORDER — NEOSTIGMINE METHYLSULFATE 1 MG/ML IJ SOLN
INTRAMUSCULAR | Status: DC | PRN
  Administered 2011-10-21: 2 mg via INTRAVENOUS

## 2011-10-21 MED ORDER — HYDROMORPHONE HCL PF 1 MG/ML IJ SOLN
INTRAMUSCULAR | Status: AC
  Filled 2011-10-21: qty 1

## 2011-10-21 MED ORDER — DEXAMETHASONE SODIUM PHOSPHATE 4 MG/ML IJ SOLN
INTRAMUSCULAR | Status: DC | PRN
  Administered 2011-10-21: 10 mg via INTRAVENOUS

## 2011-10-21 MED ORDER — SODIUM CHLORIDE 0.9 % IV SOLN: 250.0000 mL | INTRAVENOUS | Status: DC

## 2011-10-21 MED ORDER — PHENYLEPHRINE HCL 10 MG/ML IJ SOLN
INTRAMUSCULAR | Status: DC | PRN
  Administered 2011-10-21 (×5): 40 ug via INTRAVENOUS

## 2011-10-21 MED ORDER — ASPIRIN EC 81 MG PO TBEC
81.0000 mg | DELAYED_RELEASE_TABLET | Freq: Every day | ORAL | Status: DC
  Administered 2011-10-22 – 2011-10-23 (×2): 81 mg via ORAL
  Filled 2011-10-21 (×2): qty 1

## 2011-10-21 MED ORDER — MONTELUKAST SODIUM 10 MG PO TABS
10.0000 mg | ORAL_TABLET | Freq: Every day | ORAL | Status: DC
  Administered 2011-10-21 – 2011-10-22 (×2): 10 mg via ORAL
  Filled 2011-10-21 (×3): qty 1

## 2011-10-21 MED ORDER — BUDESONIDE-FORMOTEROL FUMARATE 160-4.5 MCG/ACT IN AERO
2.0000 | INHALATION_SPRAY | Freq: Two times a day (BID) | RESPIRATORY_TRACT | Status: DC
  Administered 2011-10-21 – 2011-10-23 (×4): 2 via RESPIRATORY_TRACT
  Filled 2011-10-21: qty 6

## 2011-10-21 MED ORDER — BUPIVACAINE HCL (PF) 0.5 % IJ SOLN
INTRAMUSCULAR | Status: DC | PRN
  Administered 2011-10-21: 8 mL

## 2011-10-21 MED ORDER — IPRATROPIUM BROMIDE 0.02 % IN SOLN
0.5000 mg | Freq: Four times a day (QID) | RESPIRATORY_TRACT | Status: DC
  Administered 2011-10-21: 0.5 mg via RESPIRATORY_TRACT
  Filled 2011-10-21 (×6): qty 2.5

## 2011-10-21 MED ORDER — ZOLPIDEM TARTRATE 5 MG PO TABS: 5.0000 mg | ORAL_TABLET | Freq: Every evening | ORAL | Status: DC | PRN

## 2011-10-21 MED ORDER — ALBUTEROL SULFATE (5 MG/ML) 0.5% IN NEBU
2.5000 mg | INHALATION_SOLUTION | Freq: Four times a day (QID) | RESPIRATORY_TRACT | Status: DC
  Administered 2011-10-21: 2.5 mg via RESPIRATORY_TRACT
  Filled 2011-10-21 (×6): qty 0.5

## 2011-10-21 MED ORDER — HYDROMORPHONE HCL PF 1 MG/ML IJ SOLN
0.2500 mg | INTRAMUSCULAR | Status: DC | PRN
  Administered 2011-10-21 (×4): 0.5 mg via INTRAVENOUS

## 2011-10-21 MED ORDER — ONDANSETRON HCL 4 MG/2ML IJ SOLN: 4.0000 mg | Freq: Four times a day (QID) | INTRAMUSCULAR | Status: DC | PRN

## 2011-10-21 MED ORDER — ACETAMINOPHEN 325 MG PO TABS: 650.0000 mg | ORAL_TABLET | ORAL | Status: DC | PRN

## 2011-10-21 MED ORDER — ONDANSETRON HCL 4 MG/2ML IJ SOLN: 4.0000 mg | INTRAMUSCULAR | Status: DC | PRN

## 2011-10-21 MED ORDER — ONDANSETRON HCL 4 MG/2ML IJ SOLN
INTRAMUSCULAR | Status: DC | PRN
  Administered 2011-10-21: 4 mg via INTRAVENOUS

## 2011-10-21 MED ORDER — ALBUTEROL SULFATE (5 MG/ML) 0.5% IN NEBU
2.5000 mg | INHALATION_SOLUTION | Freq: Once | RESPIRATORY_TRACT | Status: AC
  Administered 2011-10-21: 2.5 mg via RESPIRATORY_TRACT

## 2011-10-21 MED ORDER — TIOTROPIUM BROMIDE MONOHYDRATE 18 MCG IN CAPS
18.0000 ug | ORAL_CAPSULE | Freq: Every day | RESPIRATORY_TRACT | Status: DC
  Filled 2011-10-21: qty 5

## 2011-10-21 MED ORDER — LORATADINE 10 MG PO TABS
10.0000 mg | ORAL_TABLET | Freq: Every day | ORAL | Status: DC
  Administered 2011-10-21 – 2011-10-23 (×3): 10 mg via ORAL
  Filled 2011-10-21 (×3): qty 1

## 2011-10-21 MED ORDER — OXYCODONE-ACETAMINOPHEN 5-325 MG PO TABS
1.0000 | ORAL_TABLET | ORAL | Status: DC | PRN
  Administered 2011-10-21 (×2): 1 via ORAL
  Administered 2011-10-22 – 2011-10-23 (×5): 2 via ORAL
  Filled 2011-10-21 (×4): qty 2
  Filled 2011-10-21: qty 1
  Filled 2011-10-21: qty 2
  Filled 2011-10-21: qty 1

## 2011-10-21 MED ORDER — SODIUM CHLORIDE 0.9 % IJ SOLN
3.0000 mL | Freq: Two times a day (BID) | INTRAMUSCULAR | Status: DC
  Administered 2011-10-22 (×2): 3 mL via INTRAVENOUS

## 2011-10-21 MED ORDER — 0.9 % SODIUM CHLORIDE (POUR BTL) OPTIME
TOPICAL | Status: DC | PRN
  Administered 2011-10-21: 1000 mL

## 2011-10-21 MED ORDER — FENTANYL CITRATE 0.05 MG/ML IJ SOLN
INTRAMUSCULAR | Status: DC | PRN
  Administered 2011-10-21: 100 ug via INTRAVENOUS
  Administered 2011-10-21 (×3): 50 ug via INTRAVENOUS

## 2011-10-21 MED ORDER — ESCITALOPRAM OXALATE 10 MG PO TABS
10.0000 mg | ORAL_TABLET | Freq: Every day | ORAL | Status: DC
  Administered 2011-10-21 – 2011-10-23 (×3): 10 mg via ORAL
  Filled 2011-10-21 (×3): qty 1

## 2011-10-21 MED ORDER — ALBUTEROL SULFATE (5 MG/ML) 0.5% IN NEBU: 2.5000 mg | INHALATION_SOLUTION | Freq: Four times a day (QID) | RESPIRATORY_TRACT | Status: DC

## 2011-10-21 MED ORDER — BACITRACIN 50000 UNITS IM SOLR
INTRAMUSCULAR | Status: AC
  Filled 2011-10-21: qty 1

## 2011-10-21 MED ORDER — ACETAMINOPHEN 650 MG RE SUPP: 650.0000 mg | RECTAL | Status: DC | PRN

## 2011-10-21 MED ORDER — DIAZEPAM 5 MG PO TABS: 5.0000 mg | ORAL_TABLET | Freq: Four times a day (QID) | ORAL | Status: DC | PRN

## 2011-10-21 MED ORDER — LACTATED RINGERS IV SOLN
INTRAVENOUS | Status: DC | PRN
  Administered 2011-10-21 (×3): via INTRAVENOUS

## 2011-10-21 MED ORDER — KCL IN DEXTROSE-NACL 20-5-0.45 MEQ/L-%-% IV SOLN
INTRAVENOUS | Status: DC
  Administered 2011-10-21: 15:00:00 via INTRAVENOUS
  Administered 2011-10-22: 75 mL/h via INTRAVENOUS
  Filled 2011-10-21 (×3): qty 1000

## 2011-10-21 MED ORDER — MIDAZOLAM HCL 5 MG/5ML IJ SOLN
INTRAMUSCULAR | Status: DC | PRN
  Administered 2011-10-21: 2 mg via INTRAVENOUS

## 2011-10-21 MED ORDER — SODIUM CHLORIDE 0.9 % IR SOLN
Status: DC | PRN
  Administered 2011-10-21: 09:00:00

## 2011-10-21 MED ORDER — TRAMADOL HCL 50 MG PO TABS
25.0000 mg | ORAL_TABLET | Freq: Four times a day (QID) | ORAL | Status: DC | PRN
  Filled 2011-10-21: qty 1

## 2011-10-21 MED ORDER — MORPHINE SULFATE 4 MG/ML IJ SOLN: 1.0000 mg | INTRAMUSCULAR | Status: DC | PRN

## 2011-10-21 MED ORDER — HYDROCHLOROTHIAZIDE 12.5 MG PO CAPS
12.5000 mg | ORAL_CAPSULE | Freq: Every day | ORAL | Status: DC
  Administered 2011-10-21 – 2011-10-23 (×3): 12.5 mg via ORAL
  Filled 2011-10-21 (×3): qty 1

## 2011-10-21 MED ORDER — ALBUTEROL SULFATE (5 MG/ML) 0.5% IN NEBU
2.5000 mg | INHALATION_SOLUTION | RESPIRATORY_TRACT | Status: DC | PRN
  Filled 2011-10-21: qty 0.5

## 2011-10-21 MED ORDER — ALUM & MAG HYDROXIDE-SIMETH 200-200-20 MG/5ML PO SUSP: 30.0000 mL | Freq: Four times a day (QID) | ORAL | Status: DC | PRN

## 2011-10-21 MED ORDER — HYDROCODONE-ACETAMINOPHEN 5-325 MG PO TABS: 1.0000 | ORAL_TABLET | ORAL | Status: DC | PRN

## 2011-10-21 MED ORDER — SODIUM CHLORIDE 0.9 % IJ SOLN: 3.0000 mL | INTRAMUSCULAR | Status: DC | PRN

## 2011-10-21 MED ORDER — CEFAZOLIN SODIUM 1-5 GM-% IV SOLN
1.0000 g | Freq: Three times a day (TID) | INTRAVENOUS | Status: AC
  Administered 2011-10-21 (×2): 1 g via INTRAVENOUS
  Filled 2011-10-21 (×2): qty 50

## 2011-10-21 MED ORDER — DOCUSATE SODIUM 100 MG PO CAPS
100.0000 mg | ORAL_CAPSULE | Freq: Two times a day (BID) | ORAL | Status: DC
  Administered 2011-10-21 – 2011-10-23 (×4): 100 mg via ORAL
  Filled 2011-10-21 (×4): qty 1

## 2011-10-21 MED ORDER — GLYCOPYRROLATE 0.2 MG/ML IJ SOLN
INTRAMUSCULAR | Status: DC | PRN
  Administered 2011-10-21: 0.3 mg via INTRAVENOUS

## 2011-10-21 MED ORDER — MENTHOL 3 MG MT LOZG
1.0000 | LOZENGE | OROMUCOSAL | Status: DC | PRN
  Administered 2011-10-21: 3 mg via ORAL
  Filled 2011-10-21: qty 9

## 2011-10-21 MED ORDER — SODIUM CHLORIDE 0.9 % IV SOLN
INTRAVENOUS | Status: AC
  Filled 2011-10-21: qty 500

## 2011-10-21 MED ORDER — ASPIRIN 81 MG PO TBEC: 81.0000 mg | DELAYED_RELEASE_TABLET | Freq: Every day | ORAL | Status: DC

## 2011-10-21 MED ORDER — PHENOL 1.4 % MT LIQD
1.0000 | OROMUCOSAL | Status: DC | PRN
  Administered 2011-10-23: 1 via OROMUCOSAL
  Filled 2011-10-21: qty 177

## 2011-10-21 MED ORDER — ROCURONIUM BROMIDE 100 MG/10ML IV SOLN
INTRAVENOUS | Status: DC | PRN
  Administered 2011-10-21: 50 mg via INTRAVENOUS
  Administered 2011-10-21: 5 mg via INTRAVENOUS
  Administered 2011-10-21: 10 mg via INTRAVENOUS

## 2011-10-21 MED ORDER — PROPOFOL 10 MG/ML IV EMUL
INTRAVENOUS | Status: DC | PRN
  Administered 2011-10-21: 150 mg via INTRAVENOUS

## 2011-10-21 MED ORDER — ATORVASTATIN CALCIUM 10 MG PO TABS
10.0000 mg | ORAL_TABLET | Freq: Every day | ORAL | Status: DC
  Administered 2011-10-21 – 2011-10-22 (×2): 10 mg via ORAL
  Filled 2011-10-21 (×3): qty 1

## 2011-10-21 MED ORDER — THROMBIN 20000 UNITS EX KIT
PACK | CUTANEOUS | Status: DC | PRN
  Administered 2011-10-21: 09:00:00 via TOPICAL

## 2011-10-21 MED ORDER — LIDOCAINE HCL 4 % MT SOLN
OROMUCOSAL | Status: DC | PRN
  Administered 2011-10-21: 4 mL via TOPICAL

## 2011-10-21 MED ORDER — LIDOCAINE-EPINEPHRINE 1 %-1:100000 IJ SOLN
INTRAMUSCULAR | Status: DC | PRN
  Administered 2011-10-21: 8 mL

## 2011-10-21 SURGICAL SUPPLY — 84 items
ADH SKN CLS APL DERMABOND .7 (GAUZE/BANDAGES/DRESSINGS)
ADH SKN CLS LQ APL DERMABOND (GAUZE/BANDAGES/DRESSINGS) ×1
APL SKNCLS STERI-STRIP NONHPOA (GAUZE/BANDAGES/DRESSINGS)
BAG DECANTER FOR FLEXI CONT (MISCELLANEOUS) ×2 IMPLANT
BANDAGE GAUZE ELAST BULKY 4 IN (GAUZE/BANDAGES/DRESSINGS) ×2 IMPLANT
BENZOIN TINCTURE PRP APPL 2/3 (GAUZE/BANDAGES/DRESSINGS) IMPLANT
BIT DRILL 14MM (INSTRUMENTS) IMPLANT
BIT DRILL NEURO 2X3.1 SFT TUCH (MISCELLANEOUS) ×1 IMPLANT
BLADE ULTRA TIP 2M (BLADE) ×1 IMPLANT
BLOCK PROFUSE SZ 6 (Bone Implant) ×2 IMPLANT
BUR BARREL STRAIGHT FLUTE 4.0 (BURR) ×2 IMPLANT
CAGE CERVICAL 7 (Cage) ×6 IMPLANT
CAGE CERVICAL 8 (Cage) ×2 IMPLANT
CANISTER SUCTION 2500CC (MISCELLANEOUS) ×2 IMPLANT
CLOTH BEACON ORANGE TIMEOUT ST (SAFETY) ×2 IMPLANT
CONT SPEC 4OZ CLIKSEAL STRL BL (MISCELLANEOUS) ×2 IMPLANT
COVER MAYO STAND STRL (DRAPES) ×2 IMPLANT
DERMABOND ADHESIVE PROPEN (GAUZE/BANDAGES/DRESSINGS) ×1
DERMABOND ADVANCED (GAUZE/BANDAGES/DRESSINGS)
DERMABOND ADVANCED .7 DNX12 (GAUZE/BANDAGES/DRESSINGS) ×1 IMPLANT
DERMABOND ADVANCED .7 DNX6 (GAUZE/BANDAGES/DRESSINGS) IMPLANT
DRAIN JACKSON PRATT 10MM FLAT (MISCELLANEOUS) ×1 IMPLANT
DRAPE LAPAROTOMY 100X72 PEDS (DRAPES) ×2 IMPLANT
DRAPE MICROSCOPE LEICA (MISCELLANEOUS) ×1 IMPLANT
DRAPE POUCH INSTRU U-SHP 10X18 (DRAPES) ×2 IMPLANT
DRAPE PROXIMA HALF (DRAPES) IMPLANT
DRESSING TELFA 8X3 (GAUZE/BANDAGES/DRESSINGS) ×1 IMPLANT
DRILL 14MM (INSTRUMENTS) ×2
DRILL NEURO 2X3.1 SOFT TOUCH (MISCELLANEOUS) ×2
DURAPREP 6ML APPLICATOR 50/CS (WOUND CARE) ×2 IMPLANT
ELECT COATED BLADE 2.86 ST (ELECTRODE) ×2 IMPLANT
ELECT REM PT RETURN 9FT ADLT (ELECTROSURGICAL) ×2
ELECTRODE REM PT RTRN 9FT ADLT (ELECTROSURGICAL) ×1 IMPLANT
EVACUATOR SILICONE 100CC (DRAIN) ×1 IMPLANT
GAUZE SPONGE 4X4 16PLY XRAY LF (GAUZE/BANDAGES/DRESSINGS) IMPLANT
GLOVE BIO SURGEON STRL SZ8 (GLOVE) ×2 IMPLANT
GLOVE BIOGEL PI IND STRL 8 (GLOVE) ×1 IMPLANT
GLOVE BIOGEL PI IND STRL 8.5 (GLOVE) ×1 IMPLANT
GLOVE BIOGEL PI INDICATOR 8 (GLOVE) ×1
GLOVE BIOGEL PI INDICATOR 8.5 (GLOVE) ×3
GLOVE ECLIPSE 7.5 STRL STRAW (GLOVE) ×1 IMPLANT
GLOVE ECLIPSE 8.0 STRL XLNG CF (GLOVE) ×1 IMPLANT
GLOVE EXAM NITRILE LRG STRL (GLOVE) IMPLANT
GLOVE EXAM NITRILE MD LF STRL (GLOVE) ×1 IMPLANT
GLOVE EXAM NITRILE XL STR (GLOVE) IMPLANT
GLOVE EXAM NITRILE XS STR PU (GLOVE) IMPLANT
GLOVE SURG SS PI 8.0 STRL IVOR (GLOVE) ×3 IMPLANT
GOWN BRE IMP SLV AUR LG STRL (GOWN DISPOSABLE) IMPLANT
GOWN BRE IMP SLV AUR XL STRL (GOWN DISPOSABLE) ×1 IMPLANT
GOWN STRL REIN 2XL LVL4 (GOWN DISPOSABLE) ×3 IMPLANT
HEAD HALTER (SOFTGOODS) ×2 IMPLANT
KIT BASIN OR (CUSTOM PROCEDURE TRAY) ×2 IMPLANT
KIT ROOM TURNOVER OR (KITS) ×2 IMPLANT
NDL HYPO 18GX1.5 BLUNT FILL (NEEDLE) ×1 IMPLANT
NDL HYPO 25X1 1.5 SAFETY (NEEDLE) ×1 IMPLANT
NDL SPNL 22GX3.5 QUINCKE BK (NEEDLE) ×1 IMPLANT
NEEDLE HYPO 18GX1.5 BLUNT FILL (NEEDLE) IMPLANT
NEEDLE HYPO 25X1 1.5 SAFETY (NEEDLE) ×2 IMPLANT
NEEDLE SPNL 22GX3.5 QUINCKE BK (NEEDLE) ×4 IMPLANT
NS IRRIG 1000ML POUR BTL (IV SOLUTION) ×2 IMPLANT
PACK LAMINECTOMY NEURO (CUSTOM PROCEDURE TRAY) ×2 IMPLANT
PAD ARMBOARD 7.5X6 YLW CONV (MISCELLANEOUS) ×4 IMPLANT
PATTIES SURGICAL .5 X.5 (GAUZE/BANDAGES/DRESSINGS) ×1 IMPLANT
PIN DISTRACTION 14MM (PIN) ×4 IMPLANT
PLATE 68MM (Plate) ×1 IMPLANT
RUBBERBAND STERILE (MISCELLANEOUS) ×2 IMPLANT
SCREW 14MM (Screw) ×10 IMPLANT
SPACER SPNL 7D LRG 16X14X7XNS (Cage) IMPLANT
SPACER SPNL 7D LRG 16X14X8XNS (Cage) IMPLANT
SPCR SPNL 7D LRG 16X14X7XNS (Cage) ×3 IMPLANT
SPCR SPNL 7D LRG 16X14X8XNS (Cage) ×1 IMPLANT
SPONGE GAUZE 4X4 12PLY (GAUZE/BANDAGES/DRESSINGS) IMPLANT
SPONGE INTESTINAL PEANUT (DISPOSABLE) ×2 IMPLANT
SPONGE SURGIFOAM ABS GEL 100 (HEMOSTASIS) ×1 IMPLANT
STAPLER SKIN PROX WIDE 3.9 (STAPLE) IMPLANT
STRIP CLOSURE SKIN 1/2X4 (GAUZE/BANDAGES/DRESSINGS) IMPLANT
SUT VIC AB 3-0 SH 8-18 (SUTURE) ×3 IMPLANT
SYR 20ML ECCENTRIC (SYRINGE) ×2 IMPLANT
SYR 3ML LL SCALE MARK (SYRINGE) ×1 IMPLANT
TAPE CLOTH SURG 4X10 WHT LF (GAUZE/BANDAGES/DRESSINGS) ×1 IMPLANT
TOWEL OR 17X24 6PK STRL BLUE (TOWEL DISPOSABLE) ×2 IMPLANT
TOWEL OR 17X26 10 PK STRL BLUE (TOWEL DISPOSABLE) ×2 IMPLANT
TRAP SPECIMEN MUCOUS 40CC (MISCELLANEOUS) ×1 IMPLANT
WATER STERILE IRR 1000ML POUR (IV SOLUTION) ×2 IMPLANT

## 2011-10-21 NOTE — Consult Note (Signed)
Name: Michele Martin MRN: [id number] DOB: 05-08-1946    LOS: 0 Requesting MD:  Venetia Maxon Regarding:  Post op pulmonary care of a Dr. Sherene Sires COPD patient post multiple level cervical surgery who has documented gold level  3 lung disease.   ADMIT/ CONSULT NOTE  History of Present Illness: 66 yo thin WF who stills smokes and carries the diagnosis of gold 3 copd as documented by - Spirometry 09/01/2011 FEV1 0.74 (35%) ratio 50 so she is GOLD III - bicarb levels 32 08/26/11 c/w mild hypercarbia, who underwent multilevel cervical surgery 3/26 per Dr Venetia Maxon. PCCM asked to assist with her pulmonary care post op in the NSICU.   Lines / Drains: 3/26 cervical collar  Cultures: none  Antibiotics: none  Tests / Events:  Subjective: Mild resp distress post op that responded to BD's. Past Medical History  Diagnosis Date  . HYPERLIPIDEMIA 09/19/2009  . ANXIETY 04/15/2010  . DEPRESSION 09/19/2009  . Acute angle-closure glaucoma 02/14/2010  . ALLERGIC RHINITIS 09/19/2009  . CHRONIC OBSTRUCTIVE PULMONARY DISEASE, ACUTE EXACERBATION 12/19/2009  . ASTHMA 09/19/2009  . COPD 06/27/2009  . BACK PAIN 02/14/2010  . TREMOR 02/14/2010  . HOARSENESS 10/17/2009  . Wheezing 06/27/2009  . MENOPAUSAL DISORDER 08/20/2010  . TINNITUS 09/10/2010  . URI 09/10/2010  . OSTEOPENIA 09/10/2010  . Chronic bronchitis 04/14/2011  . Rotator cuff tear 06/23/2011  . Tremor 07/30/2011  . Arthritis   . Complication of anesthesia    Past Surgical History  Procedure Date  . Normal heart cath 2001    Dr. Eden Emms  . Abdominal hysterectomy   . S/p laser tx for glaucoma     no vision loss   Prior to Admission medications   Medication Sig Start Date End Date Taking? Authorizing Provider  aspirin 81 MG EC tablet Take 81 mg by mouth daily.     Yes Historical Provider, MD  atorvastatin (LIPITOR) 10 MG tablet Take 1 tablet (10 mg total) by mouth daily. 08/29/11  Yes Corwin Levins,  MD  budesonide-formoterol [facility]) 160-4.5 MCG/ACT inhaler Inhale 2 puffs into the lungs 2 (two) times daily. 12/19/10  Yes Corwin Levins, MD  escitalopram (LEXAPRO) 10 MG tablet Take 10 mg by mouth daily.  04/14/11  Yes Historical Provider, MD  fexofenadine (ALLEGRA) 180 MG tablet Take 180 mg by mouth daily.     Yes Historical Provider, MD  hydrochlorothiazide (,MICROZIDE/HYDRODIURIL,) 12.5 MG capsule Take 1 capsule (12.5 mg total) by mouth daily. 01/13/11 01/13/12 Yes Corwin Levins, MD  montelukast (SINGULAIR) 10 MG tablet Take 10 mg by mouth at bedtime.   Yes Historical Provider, MD  tiotropium (SPIRIVA HANDIHALER) 18 MCG inhalation capsule Place 1 capsule (18 mcg total) into inhaler and inhale daily. 02/03/11 02/03/12 Yes Corwin Levins, MD  traMADol (ULTRAM) 50 MG tablet Take 25-50 mg by mouth every 6 (six) hours as needed. For pain 07/30/11  Yes Corwin Levins, MD  albuterol (PROVENTIL) (2.5 MG/3ML) 0.083% nebulizer solution Take 2.5 mg by nebulization every 6 (six) hours as needed. For shortness of breath 05/20/11 05/19/12  Nyoka Cowden, MD   Allergies Allergies  Allergen Reactions  . Pravastatin     REACTION: itch and leg cramp  . Simvastatin     REACTION: itch and leg cramps  . Statins     Family History Family History  Problem Relation Age of Onset  . Dementia Mother     Social History  reports that she quit smoking about 7 weeks ago. Her  smoking use included Cigarettes. She has a 40 pack-year smoking history. She has never used smokeless tobacco. She reports that she does not drink alcohol or use illicit drugs.  Review Of Systems  11 points review of systems is negative with an exception of listed in HPI.  Vital Signs: Temp:  [97.9 F (36.6 C)-98.2 F (36.8 C)] 97.9 F (36.6 C) (03/26 1043) Pulse Rate:  [76-108] 108  (03/26 1049) Resp:  [18-26] 20  (03/26 1049) BP: (125-156)/(70-97) 156/97 mmHg (03/26 1049) SpO2:  [96 %-100 %] 99 % (03/26 1049)    Physical  Examination: General: Thin white female with increased wob. Neuro:  Awake and alert. MAE x 4 HEENT:  Cervical collar Cardiovascular:  hsr  Lungs:  Decreased air movement Abdomen:  +bs Musculoskeletal:  Upper ext weakness Skin:  warm  Ventilator settings:    Labs and Imaging:  Dg Cervical Spine 2-3 Views  10/21/2011  *RADIOLOGY REPORT*  Clinical Data: ACDF C3-C7  CERVICAL SPINE - 2-3 VIEW  Comparison: 09/03/2011  Findings: Initial lateral view of the cervical spine reveals needle localization of the C4-5 disc space.  3 mm anterior slip C3- 4.  Advanced disc degeneration and disc space narrowing at C4-5, C5- 6, and C6-7.  Image #2 reveals cervical plate fusion extending from C3-C7 with plate and screws in good position.  Interbody spacer bone graft at C3-4, C4-5, C5-6, and C6-7 in good position.  No acute complication.  IMPRESSION: Satisfactory ACDF C3 through C7.  Original Report Authenticated By: Camelia Phenes, M.D.    Lab 10/17/11 0912  NA 142  K 4.0  CL 101  CO2 31  BUN 13  CREATININE 0.53  GLUCOSE 88    Lab 10/17/11 0912  HGB 14.6  HCT 42.8  WBC 8.8  PLT 281   ABG No results found for this basename: phart, pco2, pco2art, po2, po2art, hco3, tco2, acidbasedef, o2sat    Assessment and Plan: Gold stage 3 COPD with cervical spondylosis , post cervical surgery 3/26.  -O2 as needed -BD's -Pulmonary toilet -ICU for monitoring overnight then transfer to SDU in AM.  Post cervical surgery -Per NS. -careful with tx  Best practices / Disposition: -->ICU status under Srern -->full code -->Heparin for DVT Px -->Protonix for GI Px -->diet   Brett Canales Minor ACNP Adolph Pollack PCCM Pager [phone] till 3 pm If no answer page [phone] 10/21/2011, 11:08 AM  Observe in ICU overnight then transfer to SDU in AM.

## 2011-10-21 NOTE — Anesthesia Preprocedure Evaluation (Signed)
Anesthesia Evaluation  Patient identified by MRN, date of birth, ID band Patient awake    Reviewed: Allergy & Precautions, H&P , NPO status , Patient's Chart, lab work & pertinent test results  Airway Mallampati: II  Neck ROM: full    Dental   Pulmonary asthma , COPDformer smoker         Cardiovascular     Neuro/Psych PSYCHIATRIC DISORDERS Anxiety Depression  Neuromuscular disease    GI/Hepatic   Endo/Other    Renal/GU      Musculoskeletal   Abdominal   Peds  Hematology   Anesthesia Other Findings   Reproductive/Obstetrics                           Anesthesia Physical Anesthesia Plan  ASA: II  Anesthesia Plan: General   Post-op Pain Management:    Induction: Intravenous  Airway Management Planned: Oral ETT  Additional Equipment:   Intra-op Plan:   Post-operative Plan: Extubation in OR  Informed Consent: I have reviewed the patients History and Physical, chart, labs and discussed the procedure including the risks, benefits and alternatives for the proposed anesthesia with the patient or authorized representative who has indicated his/her understanding and acceptance.     Plan Discussed with: CRNA and Surgeon  Anesthesia Plan Comments:         Anesthesia Quick Evaluation

## 2011-10-21 NOTE — H&P (Signed)
NEUROSURGICAL CONSULTATION   Candi Leash. Vanderhoef DOB: 1946-07-16    HISTORY: Michele Martin is a 66 year old retired woman with neck pain and gait changes, who is referred on an urgent basis for cervical spondylitic myelopathy from Dr. Modesto Charon from neurology. She has a significant history of COPD. She has been having balance changes and unsteady gait, and Dr. Modesto Charon obtained an MRI of her cervical spine and then referred her to me for treatment. She complains of neck stiffness, but says her neck does not hurt her a lot. She does have trouble with her walking and staggers, and has significant problems with unsteadiness of her gait.  Mrs. Heffner saw Dr. Sherene Sires recently for consideration of the possibility of going ahead with surgery as it was felt that she had significant cervical spinal pathology. He felt that she needed to stop smoking for at least two weeks and preferably six weeks. He put her on Symbicort 160 two puffs first thing in the morning and then two puffs another 12 hours later, and Spiriva and that she was going to undergo repeat evaluation in four weeks to see if she were safe to proceed with surgery.  I reviewed an MRI of her cervical spine which was obtained through [facility] on the 28th of January 2013, which shows multilevel spondylosis and stenosis with cord compression at the C3-4 through C6-7 levels, with mass effect on the spinal cord, but no significant abnormalities within the cervical cord. There is moderate to severe multifactorial neural foraminal stenosis and bilateral C4, C5, C6 and C7 nerve root compression.  In addition, plain radiographs demonstrate loss of cervical lordosis and multilevel cervical disc degeneration of these affected levels.  REVIEW OF SYSTEMS: A detailed Review of Systems sheet was reviewed with the patient. Pertinent positives include wears glasses, glaucoma, high cholesterol, swelling in feet or hands, COPD, asthma, emphysema, shortness of  breath, bronchitis, arthritis, neck pain, and inhalant (nasal) allergies. All other systems are negative; this includes Constitutional symptoms, Ears, nose, mouth, throat, Endocrine, Gastrointestinal, Genitourinary, Integumentary & Breast, Neurologic, Psychiatric, Hematologic/Lymphatic, Immunologic.  PAST MEDICAL HISTORY:   Current Medical Conditions: As previously described.   Medications and Allergies: She is ALLERGIC TO PRAVASTATIN AND SIMVASTATIN. Medications - Proventil 2.5 mg. 0.083% every six hours prn, Aspirin 81 mg. qd, Lipitor 10 mg. qd, Symbicort 160/4.5 mg. two puffs b.i.d., Lexapro 10 mg. qd, Allegra 180 mg. qd, Hydrochlorothiazide 12.5 mg. qd, Oxycodone 5 mg. one to two po q6h prn, Singulair 10 mg. qd, Spiriva handihaler 18 mcg. qd, Ultram 50 mg. 1  tablets qid.   Height and Weight: She is 5'1/2" tall, 113 lbs. with a BMI of 21.0.  FAMILY HISTORY: Her mother is deceased with Alzheimer's. Her father is deceased with prostate cancer.  SOCIAL HISTORY: She has recently stopped smoking and says that she stopped two days prior to her appointment, but that she was smoking one pack per day for 20 years. She denies alcohol or drug use.  PHYSICAL EXAMINATION:   General Appearance: On examination today, Mrs. Dishner is a pleasant and cooperative woman in no acute distress.   Blood Pressure, Pulse: Blood pressure is 108/60. Heart rate is 78 and regular. Respiratory rate is 20.   HEENT - normocephalic, atraumatic. The pupils are equal, round and reactive to light. The extraocular muscles are intact. Sclerae - white. Conjunctiva - pink. Oropharynx benign. Uvula midline.   Neck - there are no masses, meningismus, deformities, tracheal deviation, jugular vein distention or carotid bruits. Spurlings' test is  negative without reproducible radicular pain turning the patient's head to either side. Lhermitte's sign is not present with axial compression. She has limitations in rotation and flexion and  extension, although she does not have a significant amount of neck pain.   Respiratory - Diminished lower lobe breath sounds with bilateral rhonchi.   Cardiovascular - the heart has regular rate and rhythm to auscultation. No murmurs are appreciated. There is no extremity edema, cyanosis or clubbing. There are palpable pedal pulses.   Abdomen - soft, nontender, no hepatosplenomegaly appreciated or masses. There are active bowel sounds. No guarding or rebound.   Musculoskeletal Examination - the patient is able to walk about the examining room with a normal, casual, heel and toe gait. She has some pain in her right shoulder and says she has rotator cuff problems with that and has difficulty with range of motion of her right shoulder.  NEUROLOGICAL EXAMINATION: The patient is oriented to time, person and place and has good recall of both recent and remote memory with normal attention span and concentration. The patient speaks with clear and fluent speech and exhibits normal language function and appropriate fund of knowledge.   Cranial Nerve Examination - pupils are equal, round and reactive to light. Extraocular movements are full. Visual fields are full to confrontational testing. Facial sensation and facial movement are symmetric and intact. Hearing is intact to finger rub. Palate is upgoing. Shoulder shrug is symmetric. Tongue protrudes in the midline.   Motor Examination - motor strength is 5/5 in the bilateral deltoids, biceps, handgrips, wrist extensors, interosseous, bilateral triceps strength at 4/5, bilateral hand intrinsic strength at 4/5, bilateral finger extensor strength at 4/5. In the lower extremities motor strength is 5/5 in hip flexion, extension, quadriceps, hamstrings, plantar flexion, dorsiflexion and extensor hallucis longus.   Sensory Examination - normal to light touch and pinprick sensation in the upper and lower extremities.   Deep Tendon Reflexes - 3 in the biceps, 3  triceps, negative Hoffmann's sign on the right, positive on the left, 3+ in the knees, 3 in the ankles. The great toes are downgoing to plantar stimulation.  Cerebellar Examination - normal coordination in upper and lower extremities and normal rapid alternating movements. Romberg test is negative.  IMPRESSION AND RECOMMENDATIONS: Malaak Stach is a 66 year old woman with multilevel cervical spondylosis and cord compression with evidence of hyperreflexia and pathologic reflexes. Mrs. Madole is in a difficult situation. She has significant COPD and has been seeing Dr. Sherene Sires with regard to her pulmonary function and there are certainly  significant risks with her undergoing surgery. At the same time, because of her symptomatic cervical spondylitic myelopathy, I do not think that nonsurgical options is a good idea either. I spoke with her and her sister at length about attendant risks and benefits of surgery. She says that she is committed to stopping smoking and she would like to go ahead with surgery. We are planning on giving her six weeks per Dr. Thurston Hole recommendations to be off of the cigarettes and then she would go ahead with surgery on the 26th of March, which will consist of anterior cervical decompression and fusion C3 through C7 levels. She will need to be in the ICU after surgery and Dr. Sherene Sires and his pulmonary colleagues will need to co-manage her postoperatively in terms of her pulmonary function. She will be fitted for a Vista cervical collar. Risks and benefits were discussed with the patient.  I have recommended to the patient that she undergo  anterior cervical discectomy and fusion with plating. I went over the diagnostic studies in detail and reviewed surgical models and also discussed the exact nature of the surgical procedure, attendant risks, potential benefits and typical operative and postoperative course. I discussed the risks of surgery which include, but are not limited to, risks  of anesthesia, blood loss, infection, injury to various neck structures including the trachea, esophagus, which could cause temporary or permanent swallowing difficulties and also the potential for perforation of the esophagus which might require operative intervention, larynx, recurrent laryngeal nerve, which could cause either temporary or permanent vocal cord paralysis resulting in either temporary or permanent voice changes, injury to cervical nerve roots, which could cause either temporary or permanent arm pain, numbness and/or weakness. There is a small chance of injury to the spinal cord which could cause paralysis. There is also the potential for malplacement of instrumentation, fusion failure, need for repeat surgery, degenerative disease at other levels in the neck, failure to relieve the pain, worsening of pain. I also discussed with the patient that she will lose some neck mobility from the surgery. It is typical to stay in the hospital overnight after this operation. Typically she will not be able to drive for two weeks after surgery and will come back to see me two weeks following surgery with a lateral C-spine x-ray and then for monthly visits to three months after surgery. Generally patients are out of work for four to six weeks following surgery. She will wear a soft collar for two weeks after surgery.   [facility]  Danae Orleans. Venetia Maxon, M.D.  JDS:aft  cc: Dr. Denton Meek Dr. Oliver Barre

## 2011-10-21 NOTE — Anesthesia Postprocedure Evaluation (Signed)
Anesthesia Post Note  Patient: Michele Martin  Procedure(s) Performed: Procedure(s) (LRB): ANTERIOR CERVICAL DECOMPRESSION/DISCECTOMY FUSION 4 LEVELS (N/A)  Anesthesia type: General  Patient location: PACU  Post pain: Pain level controlled and Adequate analgesia  Post assessment: Post-op Vital signs reviewed, Patient's Cardiovascular Status Stable, Respiratory Function Stable, Patent Airway and Pain level controlled  Last Vitals:  Filed Vitals:   10/21/11 1049  BP: 156/97  Pulse: 108  Temp:   Resp: 20    Post vital signs: Reviewed and stable  Level of consciousness: awake, alert  and oriented  Complications: No apparent anesthesia complications

## 2011-10-21 NOTE — Transfer of Care (Signed)
Immediate Anesthesia Transfer of Care Note  Patient: Michele Martin  Procedure(s) Performed: Procedure(s) (LRB): ANTERIOR CERVICAL DECOMPRESSION/DISCECTOMY FUSION 4 LEVELS (N/A)  Patient Location: PACU  Anesthesia Type: General  Level of Consciousness: awake and alert   Airway & Oxygen Therapy: Patient Spontanous Breathing and Patient connected to nasal cannula oxygen  Post-op Assessment: Report given to PACU RN, Post -op Vital signs reviewed and stable and Patient moving all extremities  Post vital signs: Reviewed and stable  Complications: No apparent anesthesia complications

## 2011-10-21 NOTE — Op Note (Signed)
10/21/2011  10:45 AM  PATIENT:  Michele Martin  66 y.o. female  PRE-OPERATIVE DIAGNOSIS:  cervical stenosis cervical herniated disc cervical spondylosis with myelopathy, cervical radiculopathy C 3/4, C 4/5, C 5/6, C 6/7  POST-OPERATIVE DIAGNOSIS:  Cervical Stenosis,Cervical Herniated Disc,Cervical spondylosis with myelopathy C 3/4, C 4/5, C 5/6, C 6/7  PROCEDURE:  Procedure(s) (LRB): ANTERIOR CERVICAL DECOMPRESSION/DISCECTOMY FUSION 4 LEVELS (N/A)  SURGEON:  Surgeon(s) and Role:    * Maeola Harman, MD - Primary    * Karn Cassis, MD - Assisting  PHYSICIAN ASSISTANT:   ASSISTANTS: Poteat, RN   ANESTHESIA:   general  EBL:  Total I/O In: 2000 [I.V.:2000] Out: 225 [Urine:150; Blood:75]  BLOOD ADMINISTERED:none  DRAINS: Penrose drain in the prevertebral space   LOCAL MEDICATIONS USED:  LIDOCAINE   SPECIMEN:  No Specimen  DISPOSITION OF SPECIMEN:  N/A  COUNTS:  YES  TOURNIQUET:  * No tourniquets in log *  DICTATION: Patient was brought to operating room and following the smooth and uncomplicated induction of general endotracheal anesthesia her head was placed on a horseshoe head holder he was placed in 5 pounds of Holter traction and her anterior neck was prepped and draped in usual sterile fashion. An incision was made on the left side of midline after infiltrating the skin and subcutaneous tissues with local lidocaine. The platysmal layer was incised and subplatysmal dissection was performed exposing the anterior border sternocleidomastoid muscle. Using blunt dissection the carotid sheath was kept lateral and trachea and esophagus kept medial exposing the anterior cervical spine. A bent spinal needle was placed it was felt to be the C4-5 level and this was confirmed on intraoperative x-ray. Longus coli muscles were taken down from the anterior cervical spine using electrocautery and key elevator and self-retaining retractor was placed. The interspace at C 3/4 was incised  and a thorough discectomy was performed. Distraction pins were placed. Uncinate spurs and central spondylitic ridges were drilled down with a high-speed drill. The spinal cord dura and both C 4 nerve roots were widely decompressed. Hemostasis was assured. After trial sizing an 7 mm peek interbody cage was selected and packed with profuse block and autograft. The graft was tamped into position and countersunk appropriately. The retractor was moved and the interspace at C 4/5 was incised and a thorough discectomy was performed. Distraction pins were placed. Uncinate spurs and central spondylitic ridges were drilled down with a high-speed drill. The spinal cord dura and both C 5 nerve roots were widely decompressed. Hemostasis was assured. After trial sizing an 7 mm peek interbody cage was selected and packed with profuse block and autograft. The graft was tamped into position and countersunk appropriately.The interspace at C 5/6 was incised and a thorough discectomy was performed. Distraction pins were placed. Uncinate spurs and central spondylitic ridges were drilled down with a high-speed drill. The spinal cord dura and both C6 nerve roots were widely decompressed.  Hemostasis was assured. After trial sizing an 7 mm peek interbody cage was selected and packed with profuse block and autograft. The graft was tamped into position and countersunk appropriately. The interspace at C 6/7 was incised and a thorough discectomy was performed. Distraction pins were placed. Uncinate spurs and central spondylitic ridges were drilled down with a high-speed drill. The spinal cord dura and both C 7 nerve roots were widely decompressed.  Hemostasis was assured. After trial sizing an 8 mm peek interbody cage was selected and packed with profuse block and autograft. The graft  was tamped into position and countersunk appropriately. Distraction weight was removed. A 60 mm trestle luxe anterior cervical plate was affixed to the cervical  spine with 14 mm variable-angle screws 2 at C3, 2 at C4, 2 at C5, 2 at C 6,  and 2 at C7. All screws were well-positioned and locking mechanisms were engaged. Soft tissues were inspected and found to be in good repair. The wound was irrigated. A final x-ray was obtained with good visualization at C4 to C6with the interbody graft well visualized. A #10 JP drain was inserted through a separate stab incision. The platysma layer was closed with 3-0 Vicryl stitches and the skin was reapproximated with 3-0 Vicryl subcuticular stitches. The wound was dressed with Dermabond. Counts were correct at the end of the case. Patient was extubated and taken to recovery in stable and satisfactory condition.    PLAN OF CARE: Admit to inpatient   PATIENT DISPOSITION:  PACU - hemodynamically stable.   Delay start of Pharmacological VTE agent (>24hrs) due to surgical blood loss or risk of bleeding: yes

## 2011-10-21 NOTE — Anesthesia Procedure Notes (Signed)
Procedure Name: Intubation Date/Time: 10/21/2011 7:45 AM Performed by: Rossie Muskrat L Pre-anesthesia Checklist: Patient identified, Timeout performed, Emergency Drugs available, Suction available and Patient being monitored Patient Re-evaluated:Patient Re-evaluated prior to inductionOxygen Delivery Method: Circle system utilized Preoxygenation: Pre-oxygenation with 100% oxygen Intubation Type: IV induction Ventilation: Mask ventilation without difficulty Laryngoscope Size: Miller and 2 Grade View: Grade I Tube type: Oral Tube size: 7.0 mm Number of attempts: 1 Airway Equipment and Method: Stylet Placement Confirmation: ETT inserted through vocal cords under direct vision,  breath sounds checked- equal and bilateral and positive ETCO2 Secured at: 22 cm Tube secured with: Tape Dental Injury: Teeth and Oropharynx as per pre-operative assessment

## 2011-10-21 NOTE — Interval H&P Note (Signed)
History and Physical Interval Note:  10/21/2011 7:31 AM  Michele Martin  has presented today for surgery, with the diagnosis of cervical stenosis cervical herniated disc cervical spondylosis with myelopathy  The various methods of treatment have been discussed with the patient and family. After consideration of risks, benefits and other options for treatment, the patient has consented to  Procedure(s) (LRB): ANTERIOR CERVICAL DECOMPRESSION/DISCECTOMY FUSION 4 LEVELS (N/A) as a surgical intervention .  The patients' history has been reviewed, patient examined, no change in status, stable for surgery.  I have reviewed the patients' chart and labs.  Questions were answered to the patient's satisfaction.     Michele Martin D  Date of Initial H&P: 10/21/2011  History reviewed, patient examined, no change in status, stable for surgery.

## 2011-10-21 NOTE — Preoperative (Signed)
Beta Blockers   Reason not to administer Beta Blockers:Not Applicable 

## 2011-10-22 ENCOUNTER — Inpatient Hospital Stay (HOSPITAL_COMMUNITY): Payer: Medicare Other

## 2011-10-22 DIAGNOSIS — R0902 Hypoxemia: Secondary | ICD-10-CM

## 2011-10-22 DIAGNOSIS — J95821 Acute postprocedural respiratory failure: Secondary | ICD-10-CM

## 2011-10-22 DIAGNOSIS — J449 Chronic obstructive pulmonary disease, unspecified: Secondary | ICD-10-CM

## 2011-10-22 MED ORDER — TIOTROPIUM BROMIDE MONOHYDRATE 18 MCG IN CAPS
18.0000 ug | ORAL_CAPSULE | Freq: Every day | RESPIRATORY_TRACT | Status: DC
  Administered 2011-10-22 – 2011-10-23 (×2): 18 ug via RESPIRATORY_TRACT
  Filled 2011-10-22: qty 5

## 2011-10-22 NOTE — Evaluation (Signed)
Physical Therapy Evaluation Patient Details Name: SAMAMTHA TIEGS MRN: [id number] DOB: Feb 24, 1946 Today's Date: 10/22/2011  Problem List:  Patient Active Problem List  Diagnoses  . HYPERLIPIDEMIA  . ANXIETY  . DEPRESSION  . Acute angle-closure glaucoma  . ALLERGIC RHINITIS  . COPD  . HOARSENESS  . MENOPAUSAL DISORDER  . TINNITUS  . OSTEOPENIA  . Preventative health care  . Peripheral edema  . Chronic bronchitis  . Preop exam for internal medicine  . Smoker  . Rotator cuff tear  . Lower back pain  . Balance problem  . Tremor  . Posterior neck pain    Past Medical History:  Past Medical History  Diagnosis Date  . HYPERLIPIDEMIA 09/19/2009  . ANXIETY 04/15/2010  . DEPRESSION 09/19/2009  . Acute angle-closure glaucoma 02/14/2010  . ALLERGIC RHINITIS 09/19/2009  . CHRONIC OBSTRUCTIVE PULMONARY DISEASE, ACUTE EXACERBATION 12/19/2009  . ASTHMA 09/19/2009  . COPD 06/27/2009  . BACK PAIN 02/14/2010  . TREMOR 02/14/2010  . HOARSENESS 10/17/2009  . Wheezing 06/27/2009  . MENOPAUSAL DISORDER 08/20/2010  . TINNITUS 09/10/2010  . URI 09/10/2010  . OSTEOPENIA 09/10/2010  . Chronic bronchitis 04/14/2011  . Rotator cuff tear 06/23/2011  . Tremor 07/30/2011  . Arthritis   . Complication of anesthesia    Past Surgical History:  Past Surgical History  Procedure Date  . Normal heart cath 2001    Dr. Eden Emms  . Abdominal hysterectomy   . S/p laser tx for glaucoma     no vision loss    PT Assessment/Plan/Recommendation PT Assessment Clinical Impression Statement:  Pt s/p anterior cervical decompression/discectomy fusion 4 levels thus affecting PLOF.  Will benefit from acute PT services in prep for d/c home with HHPT and assistance from son. pt will need to practice stairs prior to return home.  PT Recommendation/Assessment: Patient will need skilled PT in the acute care venue PT Problem List: Decreased activity tolerance;Decreased balance;Decreased mobility;Decreased knowledge of use of  DME Barriers to Discharge: None PT Therapy Diagnosis : Difficulty walking;Abnormality of gait PT Plan PT Frequency: Min 5X/week PT Treatment/Interventions: DME instruction;Gait training;Stair training;Functional mobility training;Therapeutic activities;Therapeutic exercise;Balance training;Patient/family education PT Recommendation Follow Up Recommendations: Home health PT;Supervision for mobility/OOB Equipment Recommended: None recommended by PT PT Goals  Acute Rehab PT Goals PT Goal Formulation: With patient Time For Goal Achievement: 7 days Pt will Roll Supine to Right Side: with modified independence PT Goal: Rolling Supine to Right Side - Progress: Goal set today Pt will Roll Supine to Left Side: with modified independence PT Goal: Rolling Supine to Left Side - Progress: Goal set today Pt will go Supine/Side to Sit: with modified independence PT Goal: Supine/Side to Sit - Progress: Goal set today Pt will go Sit to Supine/Side: with modified independence PT Goal: Sit to Supine/Side - Progress: Goal set today Pt will go Sit to Stand: with modified independence PT Goal: Sit to Stand - Progress: Goal set today Pt will go Stand to Sit: with modified independence PT Goal: Stand to Sit - Progress: Goal set today Pt will Ambulate: >150 feet;with modified independence;with least restrictive assistive device PT Goal: Ambulate - Progress: Goal set today Pt will Go Up / Down Stairs: 6-9 stairs;with supervision;with least restrictive assistive device PT Goal: Up/Down Stairs - Progress: Goal set today  PT Evaluation Precautions/Restrictions  Precautions Precautions: Other (comment) (cervical) Precaution Comments: Discussed cervical precautions with pt.  Will provide handout at next session. Required Braces or Orthoses: Yes Cervical Brace: Hard collar Prior Functioning  Home Living  Lives With: Alone (son coming back for a week after surgery. (24/7 initially)) Type of Home: House Home  Layout: One level Home Access: Stairs to enter Entrance Stairs-Rails: None Entrance Stairs-Number of Steps: 6 (2 then walk then up 4) Bathroom Shower/Tub: Tub/shower unit;Door [employer] Toilet: Standard Bathroom Accessibility: Yes How Accessible: Accessible via walker Home Adaptive Equipment: Walker - rolling Prior Function Level of Independence: Independent with basic ADLs;Independent with gait Driving: No (because couldn't turn neck) Vocation: Retired Financial risk analyst Arousal/Alertness: Awake/alert Overall Cognitive Status: Appears within functional limits for tasks assessed Orientation Level: Oriented X4 Sensation/Coordination Sensation Light Touch: Appears Intact (bil. UE, LEs) Coordination Gross Motor Movements are Fluid and Coordinated: Yes (bil. UE/LE) Fine Motor Movements are Fluid and Coordinated: Yes (bil. UE) Extremity Assessment RUE Assessment RUE Assessment: Within Functional Limits LUE Assessment LUE Assessment: Within Functional Limits RLE Assessment RLE Assessment: Within Functional Limits LLE Assessment LLE Assessment: Within Functional Limits Mobility (including Balance) Bed Mobility Bed Mobility: No (Up in chair upon arrival) Transfers Transfers: Yes Sit to Stand: 4: Min assist;From chair/3-in-1;With armrests;With upper extremity assist Sit to Stand Details (indicate cue type and reason): Min assist for safety. Cueing for safe hand placment Stand to Sit: To chair/3-in-1;With armrests;With upper extremity assist;Other (comment) (min guard) Stand to Sit Details: min-guard assist for safety. Increased sway in standing.  Ambulation/Gait Ambulation/Gait: Yes Ambulation/Gait Assistance: 4: Min assist;5: Supervision Ambulation/Gait Assistance Details (indicate cue type and reason): min assist without device (decreased balance), supervision with RW. Verbal cues for upright posture and maintaining precautions. PT frequently stops ambulation when thinking or  talking indicating some decreased balance. Ambulation Distance (Feet): 150 Feet Assistive device: Rolling walker;None Gait Pattern: Step-through pattern;Decreased stride length;Trunk flexed;Shuffle Stairs: No  Posture/Postural Control Posture/Postural Control: No significant limitations Balance Balance Assessed: Yes Static Standing Balance Static Standing - Balance Support: No upper extremity supported Static Standing - Level of Assistance: Other (comment) (min-guard assist) End of Session PT - End of Session Equipment Utilized During Treatment: Gait belt;Cervical collar Activity Tolerance: Patient tolerated treatment well Patient left: in chair;with call bell in reach Nurse Communication: Mobility status for ambulation General Behavior During Session: [facility] for tasks performed Cognition: [facility] for tasks performed  Wilhemina Bonito 10/22/2011, 1:57 PM  Sherie Don) Carleene Mains PT, DPT Acute Rehabilitation [phone]

## 2011-10-22 NOTE — Progress Notes (Signed)
Occupational Therapy Evaluation Patient Details Name: Michele Martin MRN: [id number] DOB: 1946/05/28 Today's Date: 10/22/2011  Problem List:  Patient Active Problem List  Diagnoses  . HYPERLIPIDEMIA  . ANXIETY  . DEPRESSION  . Acute angle-closure glaucoma  . ALLERGIC RHINITIS  . COPD  . HOARSENESS  . MENOPAUSAL DISORDER  . TINNITUS  . OSTEOPENIA  . Preventative health care  . Peripheral edema  . Chronic bronchitis  . Preop exam for internal medicine  . Smoker  . Rotator cuff tear  . Lower back pain  . Balance problem  . Tremor  . Posterior neck pain    Past Medical History:  Past Medical History  Diagnosis Date  . HYPERLIPIDEMIA 09/19/2009  . ANXIETY 04/15/2010  . DEPRESSION 09/19/2009  . Acute angle-closure glaucoma 02/14/2010  . ALLERGIC RHINITIS 09/19/2009  . CHRONIC OBSTRUCTIVE PULMONARY DISEASE, ACUTE EXACERBATION 12/19/2009  . ASTHMA 09/19/2009  . COPD 06/27/2009  . BACK PAIN 02/14/2010  . TREMOR 02/14/2010  . HOARSENESS 10/17/2009  . Wheezing 06/27/2009  . MENOPAUSAL DISORDER 08/20/2010  . TINNITUS 09/10/2010  . URI 09/10/2010  . OSTEOPENIA 09/10/2010  . Chronic bronchitis 04/14/2011  . Rotator cuff tear 06/23/2011  . Tremor 07/30/2011  . Arthritis   . Complication of anesthesia    Past Surgical History:  Past Surgical History  Procedure Date  . Normal heart cath 2001    Dr. Eden Emms  . Abdominal hysterectomy   . S/p laser tx for glaucoma     no vision loss    OT Assessment/Plan/Recommendation OT Assessment Clinical Impression Statement: Pt s/p anterior cervical decompression/discectomy fusion 4 levels thus affecting PLOF.  Will benefit from acute OT services in prep for d/c home with [facility] and assistance from son. OT Recommendation/Assessment: Patient will need skilled OT in the acute care venue OT Problem List: Decreased activity tolerance;Decreased knowledge of use of DME or AE;Decreased knowledge of precautions;Impaired balance (sitting and/or  standing) OT Therapy Diagnosis : Generalized weakness OT Plan OT Frequency: Min 2X/week OT Treatment/Interventions: Self-care/ADL training;DME and/or AE instruction;Therapeutic activities;Balance training;Patient/family education OT Recommendation Follow Up Recommendations: Home health OT Equipment Recommended: None recommended by OT Individuals Consulted Consulted and Agree with Results and Recommendations: Patient OT Goals Acute Rehab OT Goals OT Goal Formulation: With patient Time For Goal Achievement: 7 days ADL Goals Pt Will Perform Grooming: with modified independence;Standing at sink ADL Goal: Grooming - Progress: Goal set today Pt Will Perform Upper Body Dressing: with modified independence;Sitting, bed;Sitting, chair ADL Goal: Upper Body Dressing - Progress: Goal set today Pt Will Perform Lower Body Dressing: with modified independence;Sit to stand from chair;Sit to stand from bed ADL Goal: Lower Body Dressing - Progress: Goal set today Pt Will Transfer to Toilet: with modified independence;with DME;Ambulation;Regular height toilet ADL Goal: Toilet Transfer - Progress: Goal set today Pt Will Perform Toileting - Clothing Manipulation: with modified independence;Sitting on 3-in-1 or toilet;Standing ADL Goal: Toileting - Clothing Manipulation - Progress: Goal set today Pt Will Perform Toileting - Hygiene: with modified independence;Sitting on 3-in-1 or toilet;Standing at 3-in-1/toilet;Sit to stand from 3-in-1/toilet ADL Goal: Toileting - Hygiene - Progress: Goal set today Pt Will Perform Tub/Shower Transfer: Tub transfer;with modified independence;Ambulation;with DME;Shower seat with back ADL Goal: Web designer - Progress: Goal set today  OT Evaluation Precautions/Restrictions  Precautions Precautions: Other (comment) (cervical) Precaution Comments: Discussed cervical precautions with pt.  Will provide handout at next session. Required Braces or Orthoses: Yes Cervical  Brace: Hard collar Prior Functioning Home Living Lives With: Alone (son coming  back for a week after surgery (24/7 initially)) Type of Home: House Home Layout: One level Home Access: Stairs to enter Entrance Stairs-Rails: None Entrance Stairs-Number of Steps: 6 (2 then walk then up 4) Bathroom Shower/Tub: Tub/shower unit;Door [employer] Toilet: Standard Bathroom Accessibility: Yes How Accessible: Accessible via walker Home Adaptive Equipment: Walker - rolling;Shower chair with back;Hand-held shower hose Prior Function Level of Independence: Independent with basic ADLs;Independent with gait Driving: No (because couldn't turn neck) Vocation: Retired ADL ADL Grooming: Simulated;Set up Grooming Details (indicate cue type and reason): setup to gather grooming items Where Assessed - Grooming: Sitting, chair Lower Body Bathing: Simulated;Supervision/safety Lower Body Bathing Details (indicate cue type and reason): Pt able to cross ankles over knees to access feet. Supervision for maintaining cervical precautions. Where Assessed - Lower Body Bathing: Sitting, chair Lower Body Dressing: Performed;Supervision/safety Lower Body Dressing Details (indicate cue type and reason): Pt able to don/doff socks by crossing ankles over knees with supervision for maintaining cervical precautions. Where Assessed - Lower Body Dressing: Sitting, chair Toilet Transfer: Simulated;Minimal assistance Toilet Transfer Details (indicate cue type and reason): Min assist for safety with RW and cueing for hand placement. Toilet Transfer Method: Proofreader: Other (comment) (chair) Equipment Used: Rolling walker ADL Comments: Pt reports having used the bathroom that morning without no difficulty transferring on/off toilet. Vision/Perception    Cognition Cognition Arousal/Alertness: Awake/alert Overall Cognitive Status: Appears within functional limits for tasks assessed Orientation Level:  Oriented X4 Sensation/Coordination Sensation Light Touch: Appears Intact (bil. UE) Coordination Gross Motor Movements are Fluid and Coordinated: Yes (bil. UE) Fine Motor Movements are Fluid and Coordinated: Yes (bil. UE) Extremity Assessment RUE Assessment RUE Assessment: Within Functional Limits LUE Assessment LUE Assessment: Within Functional Limits Mobility  Bed Mobility Bed Mobility: No (Up in chair upon arrival) Transfers Transfers: Yes Sit to Stand: 4: Min assist;From chair/3-in-1;With armrests;With upper extremity assist Sit to Stand Details (indicate cue type and reason): Min assist for safety. Cueing for safe hand placment. Stand to Sit: To chair/3-in-1;With armrests;With upper extremity assist;Other (comment) (min guard) Exercises   End of Session OT - End of Session Equipment Utilized During Treatment: Gait belt;Cervical collar Activity Tolerance: Patient tolerated treatment well Patient left: in chair;with call bell in reach Nurse Communication: Mobility status for transfers General Behavior During Session: [facility] for tasks performed Cognition: [facility] for tasks performed  12:52 PM  10/22/2011 Cipriano Mile OTR/L Pager [phone] Office [phone]

## 2011-10-22 NOTE — Progress Notes (Signed)
UR COMPLETED  

## 2011-10-22 NOTE — Progress Notes (Signed)
Patient transferred to 3001. Report given to receiving nurse.

## 2011-10-22 NOTE — Progress Notes (Signed)
Subjective: Patient reports doing well  Objective: Vital signs in last 24 hours: Temp:  [97.9 F (36.6 C)-98.7 F (37.1 C)] 98.5 F (36.9 C) (03/27 0400) Pulse Rate:  [74-108] 86  (03/27 0700) Resp:  [12-26] 19  (03/27 0700) BP: (113-173)/(56-127) 115/69 mmHg (03/27 0700) SpO2:  [89 %-100 %] 95 % (03/27 0742) FiO2 (%):  [2 %] 2 % (03/26 1600) Weight:  [52.164 kg (115 lb)] 52.164 kg (115 lb) (03/26 1341)  Intake/Output from previous day: 03/26 0701 - 03/27 0700 In: 4560 [P.O.:1320; I.V.:2975; IV Piggyback:50] Out: 885 [Urine:750; Drains:60; Blood:75] Intake/Output this shift:    Physical Exam: Full strength bilateral D/B/T/HI.  Full strength bilateral lower extremities. Dressing CDI  Lab Results: No results found for this basename: WBC:2,HGB:2,HCT:2,PLT:2 in the last 72 hours BMET No results found for this basename: NA:2,K:2,CL:2,CO2:2,GLUCOSE:2,BUN:2,CREATININE:2,CALCIUM:2 in the last 72 hours  Studies/Results: Dg Cervical Spine 2-3 Views  10/21/2011  *RADIOLOGY REPORT*  Clinical Data: ACDF C3-C7  CERVICAL SPINE - 2-3 VIEW  Comparison: 09/03/2011  Findings: Initial lateral view of the cervical spine reveals needle localization of the C4-5 disc space.  3 mm anterior slip C3- 4.  Advanced disc degeneration and disc space narrowing at C4-5, C5- 6, and C6-7.  Image #2 reveals cervical plate fusion extending from C3-C7 with plate and screws in good position.  Interbody spacer bone graft at C3-4, C4-5, C5-6, and C6-7 in good position.  No acute complication.  IMPRESSION: Satisfactory ACDF C3 through C7.  Original Report Authenticated By: Camelia Phenes, M.D.   Dg Chest Port 1 View  10/22/2011  *RADIOLOGY REPORT*  Clinical Data: COPD  PORTABLE CHEST - 1 VIEW  Comparison: 10/17/2011  Findings: Study is limited by poor inspiration.  Cardiomediastinal silhouette is stable.  No pulmonary edema.  Bilateral basilar atelectasis.  IMPRESSION: Limited study by poor inspiration.  Bilateral basilar  atelectasis. No pulmonary edema.  Original Report Authenticated By: Natasha Mead, M.D.    Assessment/Plan: DC drain.  Tx to floor if OK with CCM.  Home in am?    LOS: 1 day    Adiana Smelcer D, MD 10/22/2011, 8:05 AM

## 2011-10-22 NOTE — Progress Notes (Signed)
  Name: MICHEL ESKELSON MRN: [id number] DOB: [birth year]    LOS: 1 Requesting MD:  Venetia Maxon Regarding:  Post op pulmonary care of a Dr. Sherene Sires COPD patient post multiple level cervical surgery who has documented gold level  3 lung disease.   ADMIT/ CONSULT NOTE  History of Present Illness: 66 yo thin WF who stills smokes and carries the diagnosis of gold 3 copd as documented by - Spirometry 09/01/2011 FEV1 0.74 (35%) ratio 50 so she is GOLD III - bicarb levels 32 08/26/11 c/w mild hypercarbia, who underwent multilevel cervical surgery 3/26 per Dr Venetia Maxon. PCCM asked to assist with her pulmonary care post op in the NSICU.     Vital Signs: Temp:  [97.9 F (36.6 C)-98.7 F (37.1 C)] 98.5 F (36.9 C) (03/27 0400) Pulse Rate:  [74-108] 76  (03/27 0900) Resp:  [12-26] 16  (03/27 0900) BP: (113-173)/(56-127) 122/65 mmHg (03/27 0900) SpO2:  [89 %-100 %] 96 % (03/27 0900) FiO2 (%):  [2 %] 2 % (03/26 1600) Weight:  [52.164 kg (115 lb)] 52.164 kg (115 lb) (03/26 1341) I/O last 3 completed shifts: In: 4560 [P.O.:1320; I.V.:2975; WUJWJ:191; IV Piggyback:50] Out: 885 [Urine:750; Drains:60; Blood:75]  Physical Examination: General: Thin white female with increased wob. Neuro:  Awake and alert. MAE x 4 HEENT:  Cervical collar Cardiovascular:  hsr  Lungs:  CTAP Abdomen:  +bs Ext: no edema Skin:  warm    Assessment and Plan: Gold stage 3 COPD with cervical spondylosis , post cervical surgery 3/26.  -No post operative resp complications -Will resume her outpt COPD regimen -OK for discharge any time from pulmonary perspective -PCCM will sign off. Please call if e can be of further assistance    Billy Fischer, MD;  PCCM service; Mobile [phone]

## 2011-10-23 NOTE — Progress Notes (Signed)
Physical Therapy Treatment Patient Details Name: Michele Martin MRN: [id number] DOB: [birth year] Today's Date: 10/23/2011  PT Assessment/Plan  PT - Assessment/Plan Comments on Treatment Session: Pt progressing well with stairs and ambulation. Pt given cervical precaution handout and reviewed precautions and brace wear.  PT Plan: Discharge plan remains appropriate PT Frequency: Min 5X/week Follow Up Recommendations: Home health PT;Supervision for mobility/OOB Equipment Recommended: None recommended by OT;None recommended by PT PT Goals  Acute Rehab PT Goals PT Goal: Sit to Stand - Progress: Progressing toward goal PT Goal: Stand to Sit - Progress: Progressing toward goal PT Goal: Ambulate - Progress: Progressing toward goal PT Goal: Up/Down Stairs - Progress: Progressing toward goal  PT Treatment Precautions/Restrictions  Precautions Precautions: Other (comment) (cervical) Precaution Comments: given handout on cervical precautions.  Required Braces or Orthoses: Yes Cervical Brace: Hard collar Mobility (including Balance) Bed Mobility Bed Mobility: No (Up in chair upon OT arrival) Transfers Transfers: No Sit to Stand: 5: Supervision;From chair/3-in-1 Sit to Stand Details (indicate cue type and reason): VC for safe hand placement Stand to Sit: 5: Supervision;To chair/3-in-1 Stand to Sit Details: Supervision for safety. Cueing for safe hand placement. Ambulation/Gait Ambulation/Gait Assistance: 5: Supervision Ambulation/Gait Assistance Details (indicate cue type and reason): Cues to stay close and within RW.  Ambulation Distance (Feet): 240 Feet Assistive device: Rolling walker Gait Pattern: Step-through pattern;Decreased stride length;Trunk flexed Stairs: Yes Stairs Assistance: 4: Min assist Stairs Assistance Details (indicate cue type and reason): Min A for HHA as patient has no rails.  Stair Management Technique: Step to pattern;Forwards    Exercise    End of  Session PT - End of Session Equipment Utilized During Treatment: Gait belt;Cervical collar Activity Tolerance: Patient tolerated treatment well Patient left: in chair General Behavior During Session: [facility] for tasks performed Cognition: [facility] for tasks performed  Fredrich Birks 10/23/2011, 11:29 AM 10/23/2011 Fredrich Birks PTA [phone] pager [phone] office

## 2011-10-23 NOTE — Discharge Instructions (Signed)
Home Health to be provided by [facility] [phone]  Follow up with Dr. Venetia Maxon in 3-4 weeks. Call his office at [phone] today to make the appointment. You may shower without your neck brace. But be careful with head movements while in shower. Incision can get wet but do not scrub the incision site. If adhesive strips cover your incision do not pull them off. They will fall off on their own. Call Dr. Fredrich Birks office if you notice drainage from your incision, if it becomes red and swollen, and if you run a temperature.  Make sure you walk every day and use your walker. No driving until Dr. Venetia Maxon gives you clearance.

## 2011-10-23 NOTE — Discharge Summary (Signed)
Physician Discharge Summary  Patient ID: Michele Martin MRN: [id number] DOB/AGE: [birth year] 67 y.o.  Admit date: 10/21/2011 Discharge date: 10/23/2011  Admission Diagnoses: cervical stenosis cervical herniated disc cervical spondylosis with myelopathy, cervical radiculopathy C 3/4, C 4/5, C 5/6, C 6/7   Discharge Diagnoses: cervical stenosis cervical herniated disc cervical spondylosis with myelopathy, cervical radiculopathy C 3/4, C 4/5, C 5/6, C 6/7 s/p ANTERIOR CERVICAL DECOMPRESSION/DISCECTOMY FUSION 4 LEVELS    Active Problems:  * No active hospital problems. *    Discharged Condition: good  Hospital Course: Admitted with dx cervical stenosis cervical herniated disc cervical spondylosis with myelopathy, cervical radiculopathy C 3/4, C 4/5, C 5/6, C 6/7 on 10/21/11. Following uncomplicated four level ACDF pt was recovered without incident in Neuro PACU and transferred to NICU with Pulmonary Critical Care agreeing to follow d/t her COPD Hx. She was allowed to transfer to 3000 after suffering no respiratory sequelae. PT has assisted acutely & has recommended Home Health PT in follow-up.   Consults: pulmonary/intensive care  Significant Diagnostic Studies: radiology: X-Ray: intra-operative  Treatments: surgery: ANTERIOR CERVICAL DECOMPRESSION/DISCECTOMY FUSION 4 LEVELS    Discharge Exam: Blood pressure 158/76, pulse 85, temperature 97.9 F (36.6 C), temperature source Oral, resp. rate 18, height 5\' 2"  (1.575 m), weight 52.164 kg (115 lb), SpO2 95.00%. Alert, conversant, eating breakfast. Only c/o sore throat - reassured. Good strength and ROM BUE. Incision without erythema, swelling, or drainage. Drsg dry. PT assiting with mobility d/y low endurance & balance issues.     Disposition: Per Dr. Venetia Maxon: d/c IV; d/c to home with HHPT assistance. Rx's to pt: Percocet 5/325 1-2 po q6hrs prn pain #50; Valium 5mg  1 po q8hrs prn spasm #50. Pt verballizes understanding of d/c instructions.  She will call office to schedule 3-4 wk f/u with Dr. Venetia Maxon.      Medication List  As of 10/23/2011  8:42 AM   ASK your doctor about these medications         albuterol (2.5 MG/3ML) 0.083% nebulizer solution   Commonly known as: PROVENTIL   Take 2.5 mg by nebulization every 6 (six) hours as needed. For shortness of breath      aspirin 81 MG EC tablet   Take 81 mg by mouth daily.      atorvastatin 10 MG tablet   Commonly known as: LIPITOR   Take 1 tablet (10 mg total) by mouth daily.      budesonide-formoterol 160-4.5 MCG/ACT inhaler   Commonly known as: SYMBICORT   Inhale 2 puffs into the lungs 2 (two) times daily.      escitalopram 10 MG tablet   Commonly known as: LEXAPRO   Take 10 mg by mouth daily.      fexofenadine 180 MG tablet   Commonly known as: ALLEGRA   Take 180 mg by mouth daily.      hydrochlorothiazide 12.5 MG capsule   Commonly known as: MICROZIDE   Take 1 capsule (12.5 mg total) by mouth daily.      montelukast 10 MG tablet   Commonly known as: SINGULAIR   Take 10 mg by mouth at bedtime.      tiotropium 18 MCG inhalation capsule   Commonly known as: SPIRIVA   Place 1 capsule (18 mcg total) into inhaler and inhale daily.      traMADol 50 MG tablet   Commonly known as: ULTRAM   Take 25-50 mg by mouth every 6 (six) hours as needed. For pain  Signed: Georgiann Cocker 10/23/2011, 8:42 AM

## 2011-10-23 NOTE — Progress Notes (Signed)
Occupational Therapy Treatment Patient Details Name: Michele Martin MRN: [id number] DOB: [birth year] Today's Date: 10/23/2011  OT Assessment/Plan OT Assessment/Plan Comments on Treatment Session: Pt progressing towards goals.  Plans to d/c home today. OT Plan: Discharge plan remains appropriate OT Frequency: Min 2X/week Follow Up Recommendations: Home health OT Equipment Recommended: None recommended by OT OT Goals Acute Rehab OT Goals OT Goal Formulation: With patient Time For Goal Achievement: 7 days ADL Goals Pt Will Perform Grooming: with modified independence;Standing at sink ADL Goal: Grooming - Progress: Progressing toward goals Pt Will Transfer to Toilet: with modified independence;with DME;Ambulation;Regular height toilet ADL Goal: Toilet Transfer - Progress: Progressing toward goals Pt Will Perform Tub/Shower Transfer: Tub transfer;with modified independence;Ambulation;with DME;Shower seat with back ADL Goal: Web designer - Progress: Progressing toward goals  OT Treatment Precautions/Restrictions  Precautions Precautions: Other (comment) (cervical) Precaution Comments: Discussed adhering to cervical precautions during functional tasks. Required Braces or Orthoses: Yes Cervical Brace: Hard collar   ADL ADL Grooming: Performed;Supervision/safety Grooming Details (indicate cue type and reason): cueing for maintaining cervical precautions Where Assessed - Grooming: Standing at sink Toilet Transfer: Simulated;Supervision/safety;Other (comment) (intermittent min guard assist) Toilet Transfer Details (indicate cue type and reason): Intermittent min guard assist for safey with RW.  Moderate verbal cues for safe technique with RW Toilet Transfer Method: Proofreader: Other (comment) (chair) Tub/Shower Transfer: Simulated;Other (comment) (min guard assist) Tub/Shower Transfer Details (indicate cue type and reason): Educated pt on safe  technique during simulation with trash can in room. Pt able to return demonstration with min guard for safety. Tub/Shower Transfer Method: Ambulating Equipment Used: Rolling walker ADL Comments: Pt fully dressed upon OT arrival and reports that she used techniques to avoid bending neck when donning pants. Mobility  Bed Mobility Bed Mobility: No (Up in chair upon OT arrival) Transfers Transfers: Yes Sit to Stand: 5: Supervision;With upper extremity assist;From chair/3-in-1 Sit to Stand Details (indicate cue type and reason): VC for safe hand placement Stand to Sit: 5: Supervision;To chair/3-in-1;With armrests;With upper extremity assist Stand to Sit Details: Supervision for safety. Cueing for safe hand placement. Exercises    End of Session OT - End of Session Equipment Utilized During Treatment: Gait belt;Cervical collar Activity Tolerance: Patient tolerated treatment well Patient left: in chair;with call bell in reach Nurse Communication: Mobility status for transfers General Behavior During Session: [facility] for tasks performed Cognition: [facility] for tasks performed   9:16 AM 10/23/2011 Cipriano Mile OTR/L Pager [phone] Office [phone]

## 2011-10-23 NOTE — Progress Notes (Signed)
Pt d/c in stable condition with family. Home health set up and patient has walker for use at home. All information reviewed with patient and family.

## 2011-10-23 NOTE — Progress Notes (Signed)
CARE MANAGEMENT NOTE 10/23/2011  Patient:  Michele Martin, Michele Martin   Account Number:  [id number]  Date Initiated:  10/23/2011  Documentation initiated by:  Vance Peper  Subjective/Objective Assessment:   66 yr old female s/p anterior cervical decompression/discectomy fusion     Action/Plan:   Spoke with patient regarding Home Health needs. Choice offered. Patient will use her daughter's rolling walker.   Anticipated DC Date:  10/23/2011   Anticipated DC Plan:  HOME W HOME HEALTH SERVICES      DC Planning Services  CM consult      [facility] Choice  HOME HEALTH   Choice offered to / List presented to:  C-1 Patient   DME arranged  NA      DME agency  NA     HH arranged  HH-2 PT  HH-3 OT      [facility] agency  [facility]   Status of service:  Completed, signed off  Discharge Disposition:  HOME W HOME HEALTH SERVICES

## 2011-10-23 NOTE — Progress Notes (Signed)
Subjective: Patient reports "I was surprised that most of my pain was gone after surgery!"  Objective: Vital signs in last 24 hours: Temp:  [97.4 F (36.3 C)-98.4 F (36.9 C)] 97.9 F (36.6 C) (03/28 0621) Pulse Rate:  [74-89] 85  (03/28 0621) Resp:  [16-23] 18  (03/28 0621) BP: (108-158)/(53-77) 158/76 mmHg (03/28 0621) SpO2:  [93 %-97 %] 95 % (03/28 0621) FiO2 (%):  [21 %] 21 % (03/27 1900)  Intake/Output from previous day: 03/27 0701 - 03/28 0700 In: 870 [P.O.:720; I.V.:150] Out: 21 [Urine:1; Drains:20] Intake/Output this shift:    Alert, conversant, eating breakfast. Only c/o sore throat - reassured. Good strength and ROM BUE. Incision without erythema, swelling, or drainage. Drsg dry. PT assiting with mobility d/y low endurance & balance issues.   Lab Results: No results found for this basename: WBC:2,HGB:2,HCT:2,PLT:2 in the last 72 hours BMET No results found for this basename: NA:2,K:2,CL:2,CO2:2,GLUCOSE:2,BUN:2,CREATININE:2,CALCIUM:2 in the last 72 hours  Studies/Results: Dg Cervical Spine 2-3 Views  10/21/2011  *RADIOLOGY REPORT*  Clinical Data: ACDF C3-C7  CERVICAL SPINE - 2-3 VIEW  Comparison: 09/03/2011  Findings: Initial lateral view of the cervical spine reveals needle localization of the C4-5 disc space.  3 mm anterior slip C3- 4.  Advanced disc degeneration and disc space narrowing at C4-5, C5- 6, and C6-7.  Image #2 reveals cervical plate fusion extending from C3-C7 with plate and screws in good position.  Interbody spacer bone graft at C3-4, C4-5, C5-6, and C6-7 in good position.  No acute complication.  IMPRESSION: Satisfactory ACDF C3 through C7.  Original Report Authenticated By: Camelia Phenes, M.D.   Dg Chest Port 1 View  10/22/2011  *RADIOLOGY REPORT*  Clinical Data: COPD  PORTABLE CHEST - 1 VIEW  Comparison: 10/17/2011  Findings: Study is limited by poor inspiration.  Cardiomediastinal silhouette is stable.  No pulmonary edema.  Bilateral basilar  atelectasis.  IMPRESSION: Limited study by poor inspiration.  Bilateral basilar atelectasis. No pulmonary edema.  Original Report Authenticated By: Natasha Mead, M.D.    Assessment/Plan: Improving.  LOS: 2 days  Per Dr. Venetia Maxon: d/c IV; d/c to home with HHPT assistance. Rx's to pt: Percocet 5/325 1-2 po q6hrs prn pain #50; Valium 5mg  1 po q8hrs prn spasm #50. Pt verballizes understanding of d/c instructions. She will call office to schedule 3-4 wk f/u with Dr. Venetia Maxon.    Georgiann Cocker 10/23/2011, 8:36 AM

## 2011-10-27 ENCOUNTER — Telehealth: Payer: Self-pay | Admitting: Internal Medicine

## 2011-10-27 NOTE — Telephone Encounter (Signed)
Will forward to MW as Lorain Childes and sign off on note.

## 2011-11-04 NOTE — Discharge Summary (Signed)
Patient doing extremely well.

## 2011-11-19 ENCOUNTER — Ambulatory Visit (INDEPENDENT_AMBULATORY_CARE_PROVIDER_SITE_OTHER): Payer: Medicare Other | Admitting: Internal Medicine

## 2011-11-19 ENCOUNTER — Encounter: Payer: Self-pay | Admitting: Internal Medicine

## 2011-11-19 VITALS — BP 120/80 | HR 93 | Temp 97.1°F | Ht 62.0 in | Wt 117.4 lb

## 2011-11-19 DIAGNOSIS — J441 Chronic obstructive pulmonary disease with (acute) exacerbation: Secondary | ICD-10-CM

## 2011-11-19 DIAGNOSIS — R259 Unspecified abnormal involuntary movements: Secondary | ICD-10-CM

## 2011-11-19 DIAGNOSIS — R609 Edema, unspecified: Secondary | ICD-10-CM

## 2011-11-19 DIAGNOSIS — F3289 Other specified depressive episodes: Secondary | ICD-10-CM

## 2011-11-19 DIAGNOSIS — F329 Major depressive disorder, single episode, unspecified: Secondary | ICD-10-CM

## 2011-11-19 MED ORDER — PREDNISONE 10 MG PO TABS: ORAL_TABLET | ORAL | Status: DC

## 2011-11-19 MED ORDER — METHYLPREDNISOLONE ACETATE 80 MG/ML IJ SUSP
120.0000 mg | Freq: Once | INTRAMUSCULAR | Status: AC
  Administered 2011-11-19: 120 mg via INTRAMUSCULAR

## 2011-11-19 MED ORDER — CYCLOBENZAPRINE HCL 5 MG PO TABS: 5.0000 mg | ORAL_TABLET | Freq: Three times a day (TID) | ORAL | Status: AC | PRN

## 2011-11-19 NOTE — Patient Instructions (Signed)
You had the steroid shot today Take all new medications as prescribed Continue all other medications as before Please call if the flexeril generic does not work for the arm movement problem, to consider seeing Dr Modesto Charon in followup, or even a referral to the [facility] at [facility]

## 2011-11-19 NOTE — Assessment & Plan Note (Signed)
Mild, for depomedrol, and predpack, suspect ? Allergic casue, Continue all other medications as before including the symbicort

## 2011-11-19 NOTE — Assessment & Plan Note (Signed)
Unusual neuromusc etiology most liekly, for flexeril prn, consider further f/u with Dr Modesto Charon, or the [facility] d/o clinic at [facility]

## 2011-11-19 NOTE — Assessment & Plan Note (Signed)
stable overall by hx and exam, and pt to continue medical treatment as before lbe

## 2011-11-19 NOTE — Progress Notes (Signed)
Subjective:    Patient ID: Michele Martin, female    DOB: 12-16-1945, 66 y.o.   MRN: [id number]  HPI  Here after very successful c-spine surgury mar 2013 per Dr Chip Boer and Dr Ocie Doyne and did very well without significant postop complication, to wear the collar for 4  more wks, pt reports no UE pain, numnbess, weakness.  Doe though mention, even before the need for surgury, does also mentions rather dramatic and now more freq and severe one or both arm involuntary spasms/flailing;  Was tx per Dr Venetia Maxon with diazepam 5 mg but too strong, makes her loopy, cannot take, though may have helped.  Today with 3-4 days onset chest wheezing/tightness with sob without doe but with right > left chest soreness/pleuruitic and tender to cough without radiation, n/v, diaphoresis, dizziness or syncope.  Overall good compliance with treatment, and good medicine tolerability, including the allergy meds, except for the diazepam above.  No other new complaints.  Denies worsening depressive symptoms, suicidal ideation, or panic  Past Medical History  Diagnosis Date  . HYPERLIPIDEMIA 09/19/2009  . ANXIETY 04/15/2010  . DEPRESSION 09/19/2009  . Acute angle-closure glaucoma 02/14/2010  . ALLERGIC RHINITIS 09/19/2009  . CHRONIC OBSTRUCTIVE PULMONARY DISEASE, ACUTE EXACERBATION 12/19/2009  . ASTHMA 09/19/2009  . COPD 06/27/2009  . BACK PAIN 02/14/2010  . TREMOR 02/14/2010  . HOARSENESS 10/17/2009  . Wheezing 06/27/2009  . MENOPAUSAL DISORDER 08/20/2010  . TINNITUS 09/10/2010  . URI 09/10/2010  . OSTEOPENIA 09/10/2010  . Chronic bronchitis 04/14/2011  . Rotator cuff tear 06/23/2011  . Tremor 07/30/2011  . Arthritis   . Complication of anesthesia    Past Surgical History  Procedure Date  . Normal heart cath 2001    Dr. Eden Emms  . Abdominal hysterectomy   . S/p laser tx for glaucoma     no vision loss    reports that she quit smoking about 2 months ago. Her smoking use included Cigarettes. She has a 40 pack-year smoking  history. She has never used smokeless tobacco. She reports that she does not drink alcohol or use illicit drugs. family history includes Dementia in her mother. Allergies  Allergen Reactions  . Pravastatin     REACTION: itch and leg cramp  . Simvastatin     REACTION: itch and leg cramps  . Statins    Current Outpatient Prescriptions on File Prior to Visit  Medication Sig Dispense Refill  . aspirin 81 MG EC tablet Take 81 mg by mouth daily.        [name] atorvastatin (LIPITOR) 10 MG tablet Take 1 tablet (10 mg total) by mouth daily.  90 tablet  3  . budesonide-formoterol (SYMBICORT) 160-4.5 MCG/ACT inhaler Inhale 2 puffs into the lungs 2 (two) times daily.  1 Inhaler  11  . escitalopram (LEXAPRO) 10 MG tablet Take 10 mg by mouth daily.       . fexofenadine (ALLEGRA) 180 MG tablet Take 180 mg by mouth daily.        . hydrochlorothiazide (,MICROZIDE/HYDRODIURIL,) 12.5 MG capsule Take 1 capsule (12.5 mg total) by mouth daily.  90 capsule  3  . montelukast (SINGULAIR) 10 MG tablet Take 10 mg by mouth at bedtime.      [name] tiotropium (SPIRIVA HANDIHALER) 18 MCG inhalation capsule Place 1 capsule (18 mcg total) into inhaler and inhale daily.  30 capsule  12  . traMADol (ULTRAM) 50 MG tablet Take 25-50 mg by mouth every 6 (six) hours as needed. For pain      .  escitalopram (LEXAPRO) 10 MG tablet Take 1 tablet (10 mg total) by mouth daily.  30 tablet  11   Review of Systems Review of Systems  Constitutional: Negative for diaphoresis and unexpected weight change.  Eyes: Negative for photophobia and visual disturbance.  Respiratory: Negative for choking and stridor.   Gastrointestinal: Negative for vomiting and blood in stool.  Genitourinary: Negative for hematuria and decreased urine volume.  Musculoskeletal: Negative for gait problem.  Skin: Negative for color change and wound.  Neurological: Negative for tremors and numbness.  Psychiatric/Behavioral: Negative for decreased concentration. The patient  is not hyperactive.      Objective:   Physical Exam BP 120/80  Pulse 93  Temp(Src) 97.1 F (36.2 C) (Oral)  Ht 5\' 2"  (1.575 m)  Wt 117 lb 6 oz (53.241 kg)  BMI 21.47 kg/m2  SpO2 94% Physical Exam  VS noted Constitutional: Pt appears well-developed and well-nourished.  HENT: Head: Normocephalic.  Right Ear: External ear normal.  Left Ear: External ear normal.  Eyes: Conjunctivae and EOM are normal. Pupils are equal, round, and reactive to light.  Neck: Normal range of motion. Neck supple.  Cardiovascular: Normal rate and regular rhythm.   Pulmonary/Chest: Effort normal and breath sounds decresaed with very mild tightness,wheeze, no rales.; does have right > left costal margin tenderness reproducing her pain  Abd:  Soft, NT, non-distended, + BS Neurological: Pt is alert. Motor/gait intact Skin: Skin is warm. No erythema. No LE edema Psychiatric: Pt behavior is normal. Thought content normal. 1+ nervous    Assessment & Plan:

## 2011-11-19 NOTE — Assessment & Plan Note (Signed)
stable overall by hx and exam, most recent data reviewed with pt, and pt to continue medical treatment as before Lab Results  Component Value Date   WBC 8.8 10/17/2011   HGB 14.6 10/17/2011   HCT 42.8 10/17/2011   PLT 281 10/17/2011   GLUCOSE 88 10/17/2011   CHOL 186 08/26/2011   TRIG 42.0 08/26/2011   HDL 71.90 08/26/2011   LDLDIRECT 196.0 08/15/2010   LDLCALC 106* 08/26/2011   ALT 18 08/26/2011   AST 20 08/26/2011   NA 142 10/17/2011   K 4.0 10/17/2011   CL 101 10/17/2011   CREATININE 0.53 10/17/2011   BUN 13 10/17/2011   CO2 31 10/17/2011   TSH 0.62 08/26/2011   HGBA1C 5.6 08/01/2011    

## 2011-12-11 ENCOUNTER — Telehealth: Payer: Self-pay

## 2011-12-11 NOTE — Telephone Encounter (Signed)
Patient informed to continue on medication

## 2011-12-11 NOTE — Telephone Encounter (Signed)
Patient has seen TV advertisement on the dangers of Lipitor, is it ok for the patient to continue taking??

## 2011-12-11 NOTE — Telephone Encounter (Signed)
OK to take, as the risk of DM is uncertain, and most doctors and pt's are not changing any therapy

## 2011-12-17 ENCOUNTER — Other Ambulatory Visit: Payer: Self-pay

## 2011-12-17 MED ORDER — BUDESONIDE-FORMOTEROL FUMARATE 160-4.5 MCG/ACT IN AERO: 2.0000 | INHALATION_SPRAY | Freq: Two times a day (BID) | RESPIRATORY_TRACT | Status: DC

## 2011-12-30 ENCOUNTER — Encounter: Payer: Self-pay | Admitting: Internal Medicine

## 2011-12-30 ENCOUNTER — Ambulatory Visit (INDEPENDENT_AMBULATORY_CARE_PROVIDER_SITE_OTHER): Payer: Medicare Other | Admitting: Internal Medicine

## 2011-12-30 VITALS — BP 118/82 | HR 82 | Temp 97.4°F | Ht 62.0 in | Wt 118.5 lb

## 2011-12-30 DIAGNOSIS — R079 Chest pain, unspecified: Secondary | ICD-10-CM

## 2011-12-30 DIAGNOSIS — F329 Major depressive disorder, single episode, unspecified: Secondary | ICD-10-CM

## 2011-12-30 DIAGNOSIS — J441 Chronic obstructive pulmonary disease with (acute) exacerbation: Secondary | ICD-10-CM

## 2011-12-30 MED ORDER — PREDNISONE 10 MG PO TABS: ORAL_TABLET | ORAL | Status: DC

## 2011-12-30 MED ORDER — METHYLPREDNISOLONE ACETATE 80 MG/ML IJ SUSP
120.0000 mg | Freq: Once | INTRAMUSCULAR | Status: AC
  Administered 2011-12-30: 120 mg via INTRAMUSCULAR

## 2011-12-30 NOTE — Patient Instructions (Addendum)
You had the steroid shot today Take all new medications as prescribed - the higher and longer prednisone treatment/taper Continue all other medications as before I think we can hold on further blood work or imaging at this time, but if you are not better in a few days, we should probably think about doing this

## 2011-12-30 NOTE — Progress Notes (Signed)
Subjective:    Patient ID: Michele Martin, female    DOB: 1946/05/03, 66 y.o.   MRN: [id number]  HPI  Here after 4 level ACDF mar 2013 per Dr Venetia Maxon for cervical stenois with myelopathy, uncompliacted, finished home care and PT.  Was able to come off hte vent postop without difficulty;  Today with c/o CP ongoing since last seen here apr 24 - annoying per pt, mild, dull, no radiation, worse to go outside to walk, sitting outside is ok, without n/v, overt wheezing, tried albuterol but did not help, similar pain in the past with exac's but did not seem to work this time with steroid shot, c/o ongoing fatigue - "just didn't bounce back.", sleeping more when normally dont sleep during the day.  Pt denies orthopnea, PND, increased LE swelling, palpitations, dizziness or syncope.   Pt denies fever, wt loss, night sweats, loss of appetite, or other constitutional symptoms  Last cxr feb 2013 - neg for acute.  Last cbc, bmet mar 2013 - normal.   Pt denies polydipsia, polyuria.   Denies worsening depressive symptoms, suicidal ideation, or panic. Past Medical History  Diagnosis Date  . HYPERLIPIDEMIA 09/19/2009  . ANXIETY 04/15/2010  . DEPRESSION 09/19/2009  . Acute angle-closure glaucoma 02/14/2010  . ALLERGIC RHINITIS 09/19/2009  . CHRONIC OBSTRUCTIVE PULMONARY DISEASE, ACUTE EXACERBATION 12/19/2009  . ASTHMA 09/19/2009  . COPD 06/27/2009  . BACK PAIN 02/14/2010  . TREMOR 02/14/2010  . HOARSENESS 10/17/2009  . Wheezing 06/27/2009  . MENOPAUSAL DISORDER 08/20/2010  . TINNITUS 09/10/2010  . URI 09/10/2010  . OSTEOPENIA 09/10/2010  . Chronic bronchitis 04/14/2011  . Rotator cuff tear 06/23/2011  . Tremor 07/30/2011  . Arthritis   . Complication of anesthesia    Past Surgical History  Procedure Date  . Normal heart cath 2001    Dr. Eden Emms  . Abdominal hysterectomy   . S/p laser tx for glaucoma     no vision loss  . Cervical fusion 2013 - dr Venetia Maxon     reports that she quit smoking about 4 months ago. Her  smoking use included Cigarettes. She has a 40 pack-year smoking history. She has never used smokeless tobacco. She reports that she does not drink alcohol or use illicit drugs. family history includes Dementia in her mother. Allergies  Allergen Reactions  . Pravastatin     REACTION: itch and leg cramp  . Simvastatin     REACTION: itch and leg cramps  . Statins    Current Outpatient Prescriptions on File Prior to Visit  Medication Sig Dispense Refill  . aspirin 81 MG EC tablet Take 81 mg by mouth daily.        [name] atorvastatin (LIPITOR) 10 MG tablet Take 1 tablet (10 mg total) by mouth daily.  90 tablet  3  . budesonide-formoterol (SYMBICORT) 160-4.5 MCG/ACT inhaler Inhale 2 puffs into the lungs 2 (two) times daily.  1 Inhaler  11  . escitalopram (LEXAPRO) 10 MG tablet Take 10 mg by mouth daily.       . fexofenadine (ALLEGRA) 180 MG tablet Take 180 mg by mouth daily.        . hydrochlorothiazide (,MICROZIDE/HYDRODIURIL,) 12.5 MG capsule Take 1 capsule (12.5 mg total) by mouth daily.  90 capsule  3  . montelukast (SINGULAIR) 10 MG tablet Take 10 mg by mouth at bedtime.      [name] tiotropium (SPIRIVA HANDIHALER) 18 MCG inhalation capsule Place 1 capsule (18 mcg total) into inhaler and inhale daily.  30 capsule  12  . traMADol (ULTRAM) 50 MG tablet Take 25-50 mg by mouth every 6 (six) hours as needed. For pain      . escitalopram (LEXAPRO) 10 MG tablet Take 1 tablet (10 mg total) by mouth daily.  30 tablet  11  . predniSONE (DELTASONE) 10 MG tablet 3 tabs by mouth per day for 3 days,2tabs per day for 3 days,1tab per day for 3 days  18 tablet  0   Review of Systems Review of Systems  Constitutional: Negative for diaphoresis and unexpected weight change.  HENT: Negative for  tinnitus.   Eyes: Negative for photophobia and visual disturbance.  Respiratory: Negative for choking and stridor.   Gastrointestinal: Negative for vomiting and blood in stool.  Genitourinary: Negative for hematuria and  decreased urine volume.  Musculoskeletal: Negative for gait problem.  Skin: Negative for color change and wound.  Neurological: Negative for tremors and numbness.    Objective:   Physical Exam BP 118/82  Pulse 82  Temp(Src) 97.4 F (36.3 C) (Oral)  Ht 5\' 2"  (1.575 m)  Wt 118 lb 8 oz (53.751 kg)  BMI 21.67 kg/m2  SpO2 97% Physical Exam  VS noted Constitutional: Pt appears well-developed and well-nourished.  HENT: Head: Normocephalic.  Right Ear: External ear normal.  Left Ear: External ear normal.  Eyes: Conjunctivae and EOM are normal. Pupils are equal, round, and reactive to light.  Neck: Normal range of motion. Neck supple.  Cardiovascular: Normal rate and regular rhythm.   Pulmonary/Chest: Effort normal and breath sounds mild decreased, few wheeze bilat Neurological: Pt is alert. Not confused Skin: Skin is warm. No erythema.  Psychiatric: Pt behavior is normal. Thought content normal. 1+ nervous, not depressed affect    Assessment & Plan:

## 2012-01-04 ENCOUNTER — Encounter: Payer: Self-pay | Admitting: Internal Medicine

## 2012-01-04 DIAGNOSIS — R079 Chest pain, unspecified: Secondary | ICD-10-CM | POA: Insufficient documentation

## 2012-01-04 NOTE — Assessment & Plan Note (Signed)
C/w msk likely related to cough,  to f/u any worsening symptoms or concerns

## 2012-01-04 NOTE — Assessment & Plan Note (Signed)
stable overall by hx and exam, most recent data reviewed with pt, and pt to continue medical treatment as before Lab Results  Component Value Date   WBC 8.8 10/17/2011   HGB 14.6 10/17/2011   HCT 42.8 10/17/2011   PLT 281 10/17/2011   GLUCOSE 88 10/17/2011   CHOL 186 08/26/2011   TRIG 42.0 08/26/2011   HDL 71.90 08/26/2011   LDLDIRECT 196.0 08/15/2010   LDLCALC 106* 08/26/2011   ALT 18 08/26/2011   AST 20 08/26/2011   NA 142 10/17/2011   K 4.0 10/17/2011   CL 101 10/17/2011   CREATININE 0.53 10/17/2011   BUN 13 10/17/2011   CO2 31 10/17/2011   TSH 0.62 08/26/2011   HGBA1C 5.6 08/01/2011    

## 2012-01-04 NOTE — Assessment & Plan Note (Signed)
No evidence of infection, for depomedrol IM, and predpack asd,  to f/u any worsening symptoms or concerns

## 2012-01-12 ENCOUNTER — Ambulatory Visit (INDEPENDENT_AMBULATORY_CARE_PROVIDER_SITE_OTHER): Payer: Medicare Other | Admitting: Internal Medicine

## 2012-01-12 ENCOUNTER — Encounter: Payer: Self-pay | Admitting: Internal Medicine

## 2012-01-12 ENCOUNTER — Ambulatory Visit (INDEPENDENT_AMBULATORY_CARE_PROVIDER_SITE_OTHER)
Admission: RE | Admit: 2012-01-12 | Discharge: 2012-01-12 | Disposition: A | Discharge status: Home / Self Care | Payer: Medicare Other | Source: Ambulatory Visit | Attending: Internal Medicine | Admitting: Internal Medicine

## 2012-01-12 VITALS — BP 120/70 | HR 92 | Temp 97.0°F | Ht 62.0 in | Wt 114.2 lb

## 2012-01-12 DIAGNOSIS — J209 Acute bronchitis, unspecified: Secondary | ICD-10-CM

## 2012-01-12 DIAGNOSIS — R079 Chest pain, unspecified: Secondary | ICD-10-CM

## 2012-01-12 DIAGNOSIS — F411 Generalized anxiety disorder: Secondary | ICD-10-CM

## 2012-01-12 MED ORDER — LEVOFLOXACIN 250 MG PO TABS: 250.0000 mg | ORAL_TABLET | Freq: Every day | ORAL | Status: AC

## 2012-01-12 MED ORDER — ALBUTEROL SULFATE HFA 108 (90 BASE) MCG/ACT IN AERS: 2.0000 | INHALATION_SPRAY | Freq: Four times a day (QID) | RESPIRATORY_TRACT | Status: DC | PRN

## 2012-01-12 NOTE — Patient Instructions (Addendum)
Take all new medications as prescribed - the antibiotic Continue all other medications as before Please go to XRAY in the Basement for the x-ray test You will be contacted by phone if any changes need to be made immediately.  Otherwise, you will receive a letter about your results with an explanation. You are given the sample of the ventolin HFA to use at 2 puffs every 6 hrs as needed

## 2012-01-18 ENCOUNTER — Encounter: Payer: Self-pay | Admitting: Internal Medicine

## 2012-01-18 NOTE — Assessment & Plan Note (Signed)
Mild to mod, for antibx course,  to f/u any worsening symptoms or concerns 

## 2012-01-18 NOTE — Progress Notes (Signed)
Subjective:    Patient ID: Michele Martin, female    DOB: [birth year], 66 y.o.   MRN: [id number]  HPI  Here to f/u - no wheezing today, but has ongoing sob/doe , particualrly with am cough prod most in the AM, little rest of the day, hoarseness better this past wk.  Pt denies chest pain except for mild right lower lateral pleuritic pain the last few days, but no increased sob or doe, wheezing, orthopnea, PND, increased LE swelling, palpitations, dizziness or syncope. Pt denies new neurological symptoms such as new headache, or facial or extremity weakness or numbness   Pt denies polydipsia, polyuria.   Pt denies fever, wt loss, night sweats, loss of appetite, or other constitutional symptoms  Denies worsening depressive symptoms, suicidal ideation, or panic, though has ongoing anxiety. Past Medical History  Diagnosis Date  . HYPERLIPIDEMIA 09/19/2009  . ANXIETY 04/15/2010  . DEPRESSION 09/19/2009  . Acute angle-closure glaucoma 02/14/2010  . ALLERGIC RHINITIS 09/19/2009  . CHRONIC OBSTRUCTIVE PULMONARY DISEASE, ACUTE EXACERBATION 12/19/2009  . ASTHMA 09/19/2009  . COPD 06/27/2009  . BACK PAIN 02/14/2010  . TREMOR 02/14/2010  . HOARSENESS 10/17/2009  . Wheezing 06/27/2009  . MENOPAUSAL DISORDER 08/20/2010  . TINNITUS 09/10/2010  . URI 09/10/2010  . OSTEOPENIA 09/10/2010  . Chronic bronchitis 04/14/2011  . Rotator cuff tear 06/23/2011  . Tremor 07/30/2011  . Arthritis   . Complication of anesthesia    Past Surgical History  Procedure Date  . Normal heart cath 2001    Dr. Eden Emms  . Abdominal hysterectomy   . S/p laser tx for glaucoma     no vision loss  . Cervical fusion 2013 - dr Venetia Maxon     reports that she quit smoking about 4 months ago. Her smoking use included Cigarettes. She has a 40 pack-year smoking history. She has never used smokeless tobacco. She reports that she does not drink alcohol or use illicit drugs. family history includes Dementia in her mother. Allergies  Allergen Reactions   . Pravastatin     REACTION: itch and leg cramp  . Simvastatin     REACTION: itch and leg cramps  . Statins    Current Outpatient Prescriptions on File Prior to Visit  Medication Sig Dispense Refill  . aspirin 81 MG EC tablet Take 81 mg by mouth daily.        [name] atorvastatin (LIPITOR) 10 MG tablet Take 1 tablet (10 mg total) by mouth daily.  90 tablet  3  . budesonide-formoterol (SYMBICORT) 160-4.5 MCG/ACT inhaler Inhale 2 puffs into the lungs 2 (two) times daily.  1 Inhaler  11  . escitalopram (LEXAPRO) 10 MG tablet Take 10 mg by mouth daily.       . fexofenadine (ALLEGRA) 180 MG tablet Take 180 mg by mouth daily.        . hydrochlorothiazide (,MICROZIDE/HYDRODIURIL,) 12.5 MG capsule Take 1 capsule (12.5 mg total) by mouth daily.  90 capsule  3  . montelukast (SINGULAIR) 10 MG tablet Take 10 mg by mouth at bedtime.      . predniSONE (DELTASONE) 10 MG tablet 4 tabs by mouth per day for 3 days,3 tabs by mouth per day for 3 days,2 tabs for 3 days,1 tab for 3 days  30 tablet  0  . tiotropium (SPIRIVA HANDIHALER) 18 MCG inhalation capsule Place 1 capsule (18 mcg total) into inhaler and inhale daily.  30 capsule  12  . traMADol (ULTRAM) 50 MG tablet Take 25-50 mg by mouth  every 6 (six) hours as needed. For pain      . escitalopram (LEXAPRO) 10 MG tablet Take 1 tablet (10 mg total) by mouth daily.  30 tablet  11   Review of Systems Review of Systems  Constitutional: Negative for diaphoresis and unexpected weight change.  HENT: Negative for drooling and tinnitus.   Eyes: Negative for photophobia and visual disturbance.  Respiratory: Negative for choking and stridor.   Gastrointestinal: Negative for vomiting and blood in stool.  Genitourinary: Negative for hematuria and decreased urine volume.  Musculoskeletal: Negative for gait problem.  Skin: Negative for color change and wound.     Objective:   Physical Exam BP 120/70  Pulse 92  Temp 97 F (36.1 C) (Oral)  Ht 5\' 2"  (1.575 m)  Wt 114  lb 4 oz (51.823 kg)  BMI 20.90 kg/m2  SpO2 97% Physical Exam  VS noted, mild ill Constitutional: Pt appears well-developed and well-nourished.  HENT: Head: Normocephalic.  Right Ear: External ear normal.  Left Ear: External ear normal.  urij Eyes: Conjunctivae and EOM are normal. Pupils are equal, round, and reactive to light.  Neck: Normal range of motion. Neck supple.  Cardiovascular: Normal rate and regular rhythm.   Pulmonary/Chest: Effort normal and breath sounds decreased, no wheeze.  Neurological: Pt is alert. Not confused Skin: Skin is warm. No erythema.  Psychiatric: Pt behavior is normal. Thought content normal. 1-2+ nervous    Assessment & Plan:

## 2012-01-18 NOTE — Assessment & Plan Note (Signed)
Suspect msk , for cxr  - r/o pna

## 2012-01-18 NOTE — Assessment & Plan Note (Signed)
stable overall by hx and exam, most recent data reviewed with pt, and pt to continue medical treatment as before Lab Results  Component Value Date   WBC 8.8 10/17/2011   HGB 14.6 10/17/2011   HCT 42.8 10/17/2011   PLT 281 10/17/2011   GLUCOSE 88 10/17/2011   CHOL 186 08/26/2011   TRIG 42.0 08/26/2011   HDL 71.90 08/26/2011   LDLDIRECT 196.0 08/15/2010   LDLCALC 106* 08/26/2011   ALT 18 08/26/2011   AST 20 08/26/2011   NA 142 10/17/2011   K 4.0 10/17/2011   CL 101 10/17/2011   CREATININE 0.53 10/17/2011   BUN 13 10/17/2011   CO2 31 10/17/2011   TSH 0.62 08/26/2011   HGBA1C 5.6 08/01/2011

## 2012-02-04 ENCOUNTER — Other Ambulatory Visit: Payer: Self-pay

## 2012-02-04 MED ORDER — ESCITALOPRAM OXALATE 10 MG PO TABS: 10.0000 mg | ORAL_TABLET | Freq: Every day | ORAL | Status: DC

## 2012-02-18 ENCOUNTER — Other Ambulatory Visit: Payer: Self-pay

## 2012-02-18 DIAGNOSIS — J441 Chronic obstructive pulmonary disease with (acute) exacerbation: Secondary | ICD-10-CM

## 2012-02-18 MED ORDER — TIOTROPIUM BROMIDE MONOHYDRATE 18 MCG IN CAPS: 18.0000 ug | ORAL_CAPSULE | Freq: Every day | RESPIRATORY_TRACT | Status: DC

## 2012-04-13 ENCOUNTER — Other Ambulatory Visit: Payer: Self-pay

## 2012-04-13 DIAGNOSIS — R609 Edema, unspecified: Secondary | ICD-10-CM

## 2012-04-13 MED ORDER — HYDROCHLOROTHIAZIDE 12.5 MG PO CAPS: 12.5000 mg | ORAL_CAPSULE | Freq: Every day | ORAL | Status: DC

## 2012-04-15 ENCOUNTER — Encounter: Payer: Self-pay | Admitting: Internal Medicine

## 2012-04-15 ENCOUNTER — Ambulatory Visit (INDEPENDENT_AMBULATORY_CARE_PROVIDER_SITE_OTHER): Payer: Medicare Other | Admitting: Internal Medicine

## 2012-04-15 VITALS — BP 122/80 | HR 91 | Temp 99.1°F | Ht 62.0 in | Wt 112.2 lb

## 2012-04-15 DIAGNOSIS — J209 Acute bronchitis, unspecified: Secondary | ICD-10-CM

## 2012-04-15 DIAGNOSIS — J441 Chronic obstructive pulmonary disease with (acute) exacerbation: Secondary | ICD-10-CM

## 2012-04-15 DIAGNOSIS — F329 Major depressive disorder, single episode, unspecified: Secondary | ICD-10-CM

## 2012-04-15 DIAGNOSIS — Z23 Encounter for immunization: Secondary | ICD-10-CM

## 2012-04-15 MED ORDER — PREDNISONE 10 MG PO TABS: ORAL_TABLET | ORAL | Status: DC

## 2012-04-15 MED ORDER — LEVOFLOXACIN 250 MG PO TABS: 250.0000 mg | ORAL_TABLET | Freq: Every day | ORAL | Status: DC

## 2012-04-15 MED ORDER — HYDROCODONE-HOMATROPINE 5-1.5 MG/5ML PO SYRP: 5.0000 mL | ORAL_SOLUTION | Freq: Four times a day (QID) | ORAL | Status: DC | PRN

## 2012-04-15 MED ORDER — METHYLPREDNISOLONE ACETATE 80 MG/ML IJ SUSP: 120.0000 mg | Freq: Once | INTRAMUSCULAR | Status: DC

## 2012-04-15 MED ORDER — METHYLPREDNISOLONE ACETATE 80 MG/ML IJ SUSP
120.0000 mg | Freq: Once | INTRAMUSCULAR | Status: AC
  Administered 2012-04-15: 120 mg via INTRAMUSCULAR

## 2012-04-15 NOTE — Patient Instructions (Addendum)
You had the steroid shot today, and the flu shot Take all new medications as prescribed Continue all other medications as before

## 2012-04-15 NOTE — Assessment & Plan Note (Signed)
Mild to mod, for antibx course,  to f/u any worsening symptoms or concerns 

## 2012-04-15 NOTE — Assessment & Plan Note (Signed)
Mild to mod, for depomedrol IM,and predpack asd,  to f/u any worsening symptoms or concerns 

## 2012-04-15 NOTE — Assessment & Plan Note (Signed)
stable overall by hx and exam, most recent data reviewed with pt, and pt to continue medical treatment as before Lab Results  Component Value Date   WBC 8.8 10/17/2011   HGB 14.6 10/17/2011   HCT 42.8 10/17/2011   PLT 281 10/17/2011   GLUCOSE 88 10/17/2011   CHOL 186 08/26/2011   TRIG 42.0 08/26/2011   HDL 71.90 08/26/2011   LDLDIRECT 196.0 08/15/2010   LDLCALC 106* 08/26/2011   ALT 18 08/26/2011   AST 20 08/26/2011   NA 142 10/17/2011   K 4.0 10/17/2011   CL 101 10/17/2011   CREATININE 0.53 10/17/2011   BUN 13 10/17/2011   CO2 31 10/17/2011   TSH 0.62 08/26/2011   HGBA1C 5.6 08/01/2011    

## 2012-04-15 NOTE — Progress Notes (Signed)
Subjective:    Patient ID: Michele Martin, female    DOB: 1945-10-03, 66 y.o.   MRN: [id number]  HPI   Here with acute onset mild to mod 2-3 days ST, HA, general weakness and malaise, with prod cough greenish sputum, but Pt denies chest pain, increased sob or doe, wheezing, orthopnea, PND, increased LE swelling, palpitations, dizziness or syncope except for onset mild wheezing last night, with mild doe today.  Pt denies new neurological symptoms such as new headache, or facial or extremity weakness or numbness   Pt denies polydipsia, polyuria.  Denies worsening depressive symptoms, suicidal ideation, or panic, though has ongoing anxiety, not increased recently.   Past Medical History  Diagnosis Date  . HYPERLIPIDEMIA 09/19/2009  . ANXIETY 04/15/2010  . DEPRESSION 09/19/2009  . Acute angle-closure glaucoma 02/14/2010  . ALLERGIC RHINITIS 09/19/2009  . CHRONIC OBSTRUCTIVE PULMONARY DISEASE, ACUTE EXACERBATION 12/19/2009  . ASTHMA 09/19/2009  . COPD 06/27/2009  . BACK PAIN 02/14/2010  . TREMOR 02/14/2010  . HOARSENESS 10/17/2009  . Wheezing 06/27/2009  . MENOPAUSAL DISORDER 08/20/2010  . TINNITUS 09/10/2010  . URI 09/10/2010  . OSTEOPENIA 09/10/2010  . Chronic bronchitis 04/14/2011  . Rotator cuff tear 06/23/2011  . Tremor 07/30/2011  . Arthritis   . Complication of anesthesia    Past Surgical History  Procedure Date  . Normal heart cath 2001    Dr. Eden Emms  . Abdominal hysterectomy   . S/p laser tx for glaucoma     no vision loss  . Cervical fusion 2013 - dr Venetia Maxon     reports that she quit smoking about 7 months ago. Her smoking use included Cigarettes. She has a 40 pack-year smoking history. She has never used smokeless tobacco. She reports that she does not drink alcohol or use illicit drugs. family history includes Dementia in her mother. Allergies  Allergen Reactions  . Pravastatin     REACTION: itch and leg cramp  . Simvastatin     REACTION: itch and leg cramps  . Statins     Current Outpatient Prescriptions on File Prior to Visit  Medication Sig Dispense Refill  . albuterol (PROVENTIL HFA;VENTOLIN HFA) 108 (90 BASE) MCG/ACT inhaler Inhale 2 puffs into the lungs every 6 (six) hours as needed for wheezing.  1 Inhaler  11  . aspirin 81 MG EC tablet Take 81 mg by mouth daily.        [name] atorvastatin (LIPITOR) 10 MG tablet Take 1 tablet (10 mg total) by mouth daily.  90 tablet  3  . budesonide-formoterol (SYMBICORT) 160-4.5 MCG/ACT inhaler Inhale 2 puffs into the lungs 2 (two) times daily.  1 Inhaler  11  . escitalopram (LEXAPRO) 10 MG tablet Take 1 tablet (10 mg total) by mouth daily.  30 tablet  11  . fexofenadine (ALLEGRA) 180 MG tablet Take 180 mg by mouth daily.        . hydrochlorothiazide (MICROZIDE) 12.5 MG capsule Take 1 capsule (12.5 mg total) by mouth daily.  90 capsule  3  . montelukast (SINGULAIR) 10 MG tablet Take 10 mg by mouth at bedtime.      [name] tiotropium (SPIRIVA HANDIHALER) 18 MCG inhalation capsule Place 1 capsule (18 mcg total) into inhaler and inhale daily.  30 capsule  11  . traMADol (ULTRAM) 50 MG tablet Take 25-50 mg by mouth every 6 (six) hours as needed. For pain      . escitalopram (LEXAPRO) 10 MG tablet Take 1 tablet (10 mg total) by mouth  daily.  30 tablet  11   No current facility-administered medications on file prior to visit.   Review of Systems  Constitutional: Negative for diaphoresis and unexpected weight change.  HENT: Negative for tinnitus.   Eyes: Negative for photophobia and visual disturbance.  Respiratory: Negative for choking and stridor.   Gastrointestinal: Negative for vomiting and blood in stool.  Genitourinary: Negative for hematuria and decreased urine volume.  Musculoskeletal: Negative for gait problem.  Skin: Negative for color change and wound.  Neurological: Negative for tremors and numbness.  Psychiatric/Behavioral: Negative for decreased concentration. The patient is not hyperactive.      Objective:    Physical Exam BP 122/80  Pulse 91  Temp 99.1 F (37.3 C) (Oral)  Ht 5\' 2"  (1.575 m)  Wt 112 lb 4 oz (50.916 kg)  BMI 20.53 kg/m2  SpO2 92% Physical Exam  VS noted, mild ill Constitutional: Pt appears well-developed and well-nourished.  HENT: Head: Normocephalic.  Right Ear: External ear normal.  Left Ear: External ear normal.  Bilat tm's mild erythema.  Sinus nontender.  Pharynx mild erythema Eyes: Conjunctivae and EOM are normal. Pupils are equal, round, and reactive to light.  Neck: Normal range of motion. Neck supple.  Cardiovascular: Normal rate and regular rhythm.   Pulmonary/Chest: Effort normal and breath sounds decreased with bilat mild wheeze Neurological: Pt is alert. Not confused  Skin: Skin is warm. No erythema.  Psychiatric: Pt behavior is normal. Thought content normal. not depressed appearing    Assessment & Plan:

## 2012-04-16 NOTE — Addendum Note (Signed)
Addended by: Scharlene Gloss B on: 04/16/2012 01:08 PM   Modules accepted: Orders

## 2012-05-07 ENCOUNTER — Encounter: Payer: Self-pay | Admitting: Internal Medicine

## 2012-05-07 ENCOUNTER — Other Ambulatory Visit (INDEPENDENT_AMBULATORY_CARE_PROVIDER_SITE_OTHER): Payer: Medicare Other

## 2012-05-07 ENCOUNTER — Ambulatory Visit (INDEPENDENT_AMBULATORY_CARE_PROVIDER_SITE_OTHER): Payer: Medicare Other | Admitting: Internal Medicine

## 2012-05-07 VITALS — BP 108/72 | HR 75 | Temp 97.6°F | Ht 62.0 in | Wt 111.5 lb

## 2012-05-07 DIAGNOSIS — R42 Dizziness and giddiness: Secondary | ICD-10-CM

## 2012-05-07 DIAGNOSIS — F329 Major depressive disorder, single episode, unspecified: Secondary | ICD-10-CM

## 2012-05-07 DIAGNOSIS — J449 Chronic obstructive pulmonary disease, unspecified: Secondary | ICD-10-CM

## 2012-05-07 DIAGNOSIS — I951 Orthostatic hypotension: Secondary | ICD-10-CM

## 2012-05-07 DIAGNOSIS — R609 Edema, unspecified: Secondary | ICD-10-CM

## 2012-05-07 LAB — BASIC METABOLIC PANEL
CO2: 34 mEq/L — ABNORMAL HIGH (ref 19–32)
Calcium: 9.4 mg/dL (ref 8.4–10.5)
GFR: 105.76 mL/min (ref >60.00)
Sodium: 142 mEq/L (ref 135–145)

## 2012-05-07 LAB — URINALYSIS, ROUTINE W REFLEX MICROSCOPIC
Bilirubin Urine: NEGATIVE
Ketones, ur: NEGATIVE
Leukocytes, UA: NEGATIVE
Urobilinogen, UA: 0.2 (ref 0.0–1.0)

## 2012-05-07 LAB — CBC WITH DIFFERENTIAL/PLATELET
Basophils Absolute: 0 10*3/uL (ref 0.0–0.1)
Eosinophils Absolute: 0.2 10*3/uL (ref 0.0–0.7)
HCT: 47 % — ABNORMAL HIGH (ref 36.0–46.0)
Hemoglobin: 15.6 g/dL — ABNORMAL HIGH (ref 12.0–15.0)
Lymphs Abs: 1.7 10*3/uL (ref 0.7–4.0)
MCHC: 33.2 g/dL (ref 30.0–36.0)
MCV: 97.1 fl (ref 78.0–100.0)
Monocytes Absolute: 0.5 10*3/uL (ref 0.1–1.0)
Neutro Abs: 6.4 10*3/uL (ref 1.4–7.7)
RDW: 13 % (ref 11.5–14.6)

## 2012-05-07 LAB — HEPATIC FUNCTION PANEL
Alkaline Phosphatase: 68 U/L (ref 39–117)
Bilirubin, Direct: 0.1 mg/dL (ref 0.0–0.3)
Total Protein: 6.9 g/dL (ref 6.0–8.3)

## 2012-05-07 NOTE — Assessment & Plan Note (Addendum)
ECG reviewed as per emr, VS c/w orthostasis today, to d/c the HCTZ, for increased fluids and additional salt for the next few days only; if edema recurs would not re-start the HCT, rather for leg elevation, compression stockings, low salt and wt control; for now to avoid elevated heights or driving long distances  Note:  Total time for pt hx, exam, review of record with pt in the room, determination of diagnoses and plan for further eval and tx is > 40 min, with over 50% spent in coordination and counseling of patient

## 2012-05-07 NOTE — Patient Instructions (Addendum)
It appears the fluid pill is too strong for you, even though it is only a small amount. Please stop the HCTZ fluid pill Please try to drink more fluids, and add a little extra salt to your diet in the next wk, which should help quite a bit. If not better, you should call and we can consider a neurological referral Please go to LAB in the [facility]ement for the blood and/or urine tests to be done today, to make sure you dont have anemia, kidney slowing, or other issue You will be contacted by phone if any changes need to be made immediately.  Otherwise, you will receive a letter about your results with an explanation. Please remember to sign up for My Chart at your earliest convenience, as this will be important to you in the future with finding out test results. Continue all other medications as before Please have the pharmacy call with any refills you may need. If the swelling returns to the ankles in the future, we should not re-start the fluid pill, but you should try to elevate the legs when you are sitting, consider compression stockings to both legs below the knees to keep the swelling down, and avoid salt in the diet Until you are better, you should not drive long distances, and do not use step stools/ladders or chairs at home to stand on

## 2012-05-08 ENCOUNTER — Encounter: Payer: Self-pay | Admitting: Internal Medicine

## 2012-05-08 MED ORDER — CYCLOBENZAPRINE HCL 5 MG PO TABS: 5.0000 mg | ORAL_TABLET | Freq: Three times a day (TID) | ORAL | Status: DC | PRN

## 2012-05-08 NOTE — Assessment & Plan Note (Signed)
stable overall by hx and exam, most recent data reviewed with pt, and pt to continue medical treatment as before Lab Results  Component Value Date   WBC 8.8 05/07/2012   HGB 15.6* 05/07/2012   HCT 47.0* 05/07/2012   PLT 236.0 05/07/2012   GLUCOSE 89 05/07/2012   CHOL 186 08/26/2011   TRIG 42.0 08/26/2011   HDL 71.90 08/26/2011   LDLDIRECT 196.0 08/15/2010   LDLCALC 106* 08/26/2011   ALT 22 05/07/2012   AST 24 05/07/2012   NA 142 05/07/2012   K 4.6 05/07/2012   CL 100 05/07/2012   CREATININE 0.6 05/07/2012   BUN 16 05/07/2012   CO2 34* 05/07/2012   TSH 0.62 08/26/2011   HGBA1C 5.6 08/01/2011    

## 2012-05-08 NOTE — Assessment & Plan Note (Signed)
See discussion for orthostatic hypotension

## 2012-05-08 NOTE — Assessment & Plan Note (Signed)
stable overall by hx and exam, most recent data reviewed with pt, and pt to continue medical treatment as before SpO2 Readings from Last 3 Encounters:  05/07/12 96%  04/15/12 92%  01/12/12 97%

## 2012-05-08 NOTE — Progress Notes (Signed)
Subjective:    Patient ID: Michele Martin, female    DOB: 1946-07-21, 66 y.o.   MRN: [id number]  HPI  Here with new problem of dizziness/lightheaded  - orthostatic type with worsening to stand up for the past 2 wks.   Pt denies chest pain, increased sob or doe, wheezing, orthopnea, PND, increased LE swelling, palpitations, dizziness or syncope except for the above.  Pt denies new neurological symptoms such as new headache, or facial or extremity weakness or numbness   Pt denies polydipsia, polyuria, Pt states overall good compliance with meds, trying to follow lower cholesterol, diabetic diet, wt overall stable.  Pt denies fever, wt loss, night sweats, loss of appetite, or other constitutional symptoms  Denies worsening reflux, dysphagia, abd pain, n/v, bowel change or blood.   Denies urinary symptoms such as dysuria, frequency, urgency,or hematuria.  Denies any worsening pain, depression or decreased po intake.  Has been on HCTZ 12.5 mg for some time for bilat ankle swelling.  Wt today 111, down from 112 on sept 19 Past Medical History  Diagnosis Date  . HYPERLIPIDEMIA 09/19/2009  . ANXIETY 04/15/2010  . DEPRESSION 09/19/2009  . Acute angle-closure glaucoma 02/14/2010  . ALLERGIC RHINITIS 09/19/2009  . CHRONIC OBSTRUCTIVE PULMONARY DISEASE, ACUTE EXACERBATION 12/19/2009  . ASTHMA 09/19/2009  . COPD 06/27/2009  . BACK PAIN 02/14/2010  . TREMOR 02/14/2010  . HOARSENESS 10/17/2009  . Wheezing 06/27/2009  . MENOPAUSAL DISORDER 08/20/2010  . TINNITUS 09/10/2010  . URI 09/10/2010  . OSTEOPENIA 09/10/2010  . Chronic bronchitis 04/14/2011  . Rotator cuff tear 06/23/2011  . Tremor 07/30/2011  . Arthritis   . Complication of anesthesia    Past Surgical History  Procedure Date  . Normal heart cath 2001    Dr. Eden Emms  . Abdominal hysterectomy   . S/p laser tx for glaucoma     no vision loss  . Cervical fusion 2013 - dr Venetia Maxon     reports that she quit smoking about 8 months ago. Her smoking use included  Cigarettes. She has a 40 pack-year smoking history. She has never used smokeless tobacco. She reports that she does not drink alcohol or use illicit drugs. family history includes Dementia in her mother. Allergies  Allergen Reactions  . Pravastatin     REACTION: itch and leg cramp  . Simvastatin     REACTION: itch and leg cramps  . Statins    Current Outpatient Prescriptions on File Prior to Visit  Medication Sig Dispense Refill  . albuterol (PROVENTIL HFA;VENTOLIN HFA) 108 (90 BASE) MCG/ACT inhaler Inhale 2 puffs into the lungs every 6 (six) hours as needed for wheezing.  1 Inhaler  11  . aspirin 81 MG EC tablet Take 81 mg by mouth daily.        [name] atorvastatin (LIPITOR) 10 MG tablet Take 1 tablet (10 mg total) by mouth daily.  90 tablet  3  . budesonide-formoterol (SYMBICORT) 160-4.5 MCG/ACT inhaler Inhale 2 puffs into the lungs 2 (two) times daily.  1 Inhaler  11  . escitalopram (LEXAPRO) 10 MG tablet Take 1 tablet (10 mg total) by mouth daily.  30 tablet  11  . fexofenadine (ALLEGRA) 180 MG tablet Take 180 mg by mouth daily.        . montelukast (SINGULAIR) 10 MG tablet Take 10 mg by mouth at bedtime.      [name] tiotropium (SPIRIVA HANDIHALER) 18 MCG inhalation capsule Place 1 capsule (18 mcg total) into inhaler and inhale daily.  30 capsule  11  . traMADol (ULTRAM) 50 MG tablet Take 25-50 mg by mouth every 6 (six) hours as needed. For pain      . escitalopram (LEXAPRO) 10 MG tablet Take 1 tablet (10 mg total) by mouth daily.  30 tablet  11  . HYDROcodone-homatropine (HYCODAN) 5-1.5 MG/5ML syrup Take 5 mLs by mouth every 6 (six) hours as needed for cough.  120 mL  1  . levofloxacin (LEVAQUIN) 250 MG tablet Take 1 tablet (250 mg total) by mouth daily.  10 tablet  0  . predniSONE (DELTASONE) 10 MG tablet 3 tabs by mouth per day for 3 days,2tabs per day for 3 days,1tab per day for 3 days  18 tablet  0   Review of Systems  Constitutional: Negative for diaphoresis.  HENT: Negative for tinnitus.    Eyes: Negative for photophobia and visual disturbance.  Respiratory: Negative for choking and stridor.   Gastrointestinal: Negative for vomiting and blood in stool.  Genitourinary: Negative for hematuria and decreased urine volume.  Musculoskeletal: Negative for gait problem.  Skin: Negative for color change and wound.  Neurological: Negative for tremors and numbness.  Psychiatric/Behavioral: Negative for decreased concentration. The patient is not hyperactive.      Objective:   Physical Exam BP 108/72  Pulse 75  Temp 97.6 F (36.4 C) (Oral)  Ht 5\' 2"  (1.575 m)  Wt 111 lb 8 oz (50.576 kg)  BMI 20.39 kg/m2  SpO2 96% Physical Exam  VS noted Constitutional: Pt appears well-developed and well-nourished.  HENT: Head: Normocephalic.  Right Ear: External ear normal.  Left Ear: External ear normal.  Eyes: Conjunctivae and EOM are normal. Pupils are equal, round, and reactive to light.  Neck: Normal range of motion. Neck supple.  Cardiovascular: Normal rate and regular rhythm.   Pulmonary/Chest: Effort normal and breath sounds normal.  Abd:  Soft, NT, non-distended, + BS Neurological: Pt is alert. Not confused  Skin: Skin is warm. No erythema. No LE edema Psychiatric: Pt behavior is normal. Thought content normal.     Assessment & Plan:

## 2012-05-14 ENCOUNTER — Telehealth: Payer: Self-pay | Admitting: *Deleted

## 2012-05-14 DIAGNOSIS — R42 Dizziness and giddiness: Secondary | ICD-10-CM

## 2012-05-14 NOTE — Telephone Encounter (Signed)
Ok for MRI head r/o cva, and consider neuro consult

## 2012-05-14 NOTE — Telephone Encounter (Signed)
Patient called state she is still very dizzy, room spinning,. States has drank water and is well hydrated. Please advise. [phone]

## 2012-05-18 NOTE — Telephone Encounter (Signed)
Patient informed. 

## 2012-05-19 ENCOUNTER — Ambulatory Visit
Admission: RE | Admit: 2012-05-19 | Discharge: 2012-05-19 | Disposition: A | Discharge status: Home / Self Care | Payer: Medicare Other | Source: Ambulatory Visit | Attending: Internal Medicine | Admitting: Internal Medicine

## 2012-05-19 DIAGNOSIS — R42 Dizziness and giddiness: Secondary | ICD-10-CM

## 2012-05-20 ENCOUNTER — Other Ambulatory Visit: Payer: Medicare Other

## 2012-05-20 ENCOUNTER — Telehealth: Payer: Self-pay | Admitting: Internal Medicine

## 2012-05-20 MED ORDER — MECLIZINE HCL 25 MG PO TABS: ORAL_TABLET | ORAL | Status: DC

## 2012-05-20 MED ORDER — ONDANSETRON HCL 8 MG PO TABS: ORAL_TABLET | ORAL | Status: DC

## 2012-05-20 NOTE — Telephone Encounter (Signed)
Message copied by Corwin Levins on Thu May 20, 2012  5:12 PM ------      Message from: Scharlene Gloss B      Created: Thu May 20, 2012  3:29 PM       The patient is having a really hard time with being dizzy and nausea.  She would like something for this if possible

## 2012-05-20 NOTE — Telephone Encounter (Signed)
Two rx done erx

## 2012-05-21 NOTE — Telephone Encounter (Signed)
Called the patient on both cell and home number left message to call back 

## 2012-05-21 NOTE — Telephone Encounter (Signed)
Called the patient and informed 2 prescriptions sent to pharmacy.  As far as referral goes the patient stated she does not want a referral.  She has complete trust in her MD that if he is not concerned she is not.

## 2012-05-21 NOTE — Telephone Encounter (Signed)
I can refer to neurology here or at a place such as [facility] or locally in [place] or [place]  O/w unable to make phone consultations

## 2012-05-21 NOTE — Telephone Encounter (Signed)
Call received from pt's sister expressing concern over MRI results. Sister and pt are bother very upset about result and MD's advisement that there is nothing that can be done. Sister is requesting MD or assistant contact pt to discuss in further detail.

## 2012-06-17 ENCOUNTER — Encounter: Payer: Self-pay | Admitting: Internal Medicine

## 2012-06-17 ENCOUNTER — Ambulatory Visit (INDEPENDENT_AMBULATORY_CARE_PROVIDER_SITE_OTHER): Payer: Medicare Other | Admitting: Internal Medicine

## 2012-06-17 VITALS — BP 118/80 | HR 76 | Temp 97.3°F | Ht 62.0 in | Wt 113.1 lb

## 2012-06-17 DIAGNOSIS — J441 Chronic obstructive pulmonary disease with (acute) exacerbation: Secondary | ICD-10-CM | POA: Insufficient documentation

## 2012-06-17 DIAGNOSIS — R609 Edema, unspecified: Secondary | ICD-10-CM

## 2012-06-17 DIAGNOSIS — Z0000 Encounter for general adult medical examination without abnormal findings: Secondary | ICD-10-CM

## 2012-06-17 DIAGNOSIS — F411 Generalized anxiety disorder: Secondary | ICD-10-CM

## 2012-06-17 MED ORDER — HYDROCODONE-HOMATROPINE 5-1.5 MG/5ML PO SYRP: 5.0000 mL | ORAL_SOLUTION | Freq: Four times a day (QID) | ORAL | Status: DC | PRN

## 2012-06-17 MED ORDER — PREDNISONE 10 MG PO TABS: ORAL_TABLET | ORAL | Status: DC

## 2012-06-17 NOTE — Patient Instructions (Addendum)
Take all new medications as prescribed  Continue all other medications as before Please return in 3 months for your yearly visit, or sooner if needed, with Lab testing done 3-5 days before

## 2012-06-17 NOTE — Progress Notes (Signed)
Subjective:    Patient ID: Michele Martin, female    DOB: 10-Jul-1946, 66 y.o.   MRN: [id number]  HPI    Here with c/o 3-4 days increased production and freq of cough that caused her concern as the volume is so much, has some chest soreness with coughing, doesn't feel ill.  Stopped smoking cigs mar 2013, has had some e-cigs since then.   Pt denies fever, wt loss, night sweats, loss of appetite, or other constitutional symptoms  Pt denies chest pain, increased sob or doe, wheezing, orthopnea, PND, increased LE swelling, palpitations, dizziness or syncope. Except for the above, and mild wheeze for 2-3 days.  Pt denies new neurological symptoms such as new headache, or facial or extremity weakness or numbness   Pt denies polydipsia, polyuria. Denies worsening depressive symptoms, suicidal ideation, or panic Past Medical History  Diagnosis Date  . HYPERLIPIDEMIA 09/19/2009  . ANXIETY 04/15/2010  . DEPRESSION 09/19/2009  . Acute angle-closure glaucoma 02/14/2010  . ALLERGIC RHINITIS 09/19/2009  . CHRONIC OBSTRUCTIVE PULMONARY DISEASE, ACUTE EXACERBATION 12/19/2009  . ASTHMA 09/19/2009  . COPD 06/27/2009  . BACK PAIN 02/14/2010  . TREMOR 02/14/2010  . HOARSENESS 10/17/2009  . Wheezing 06/27/2009  . MENOPAUSAL DISORDER 08/20/2010  . TINNITUS 09/10/2010  . URI 09/10/2010  . OSTEOPENIA 09/10/2010  . Chronic bronchitis 04/14/2011  . Rotator cuff tear 06/23/2011  . Tremor 07/30/2011  . Arthritis   . Complication of anesthesia    Past Surgical History  Procedure Date  . Normal heart cath 2001    Dr. Eden Emms  . Abdominal hysterectomy   . S/p laser tx for glaucoma     no vision loss  . Cervical fusion 2013 - dr Venetia Maxon     reports that she quit smoking about 9 months ago. Her smoking use included Cigarettes. She has a 40 pack-year smoking history. She has never used smokeless tobacco. She reports that she does not drink alcohol or use illicit drugs. family history includes Dementia in her mother. Allergies    Allergen Reactions  . Pravastatin     REACTION: itch and leg cramp  . Simvastatin     REACTION: itch and leg cramps  . Statins    Current Outpatient Prescriptions on File Prior to Visit  Medication Sig Dispense Refill  . albuterol (PROVENTIL HFA;VENTOLIN HFA) 108 (90 BASE) MCG/ACT inhaler Inhale 2 puffs into the lungs every 6 (six) hours as needed for wheezing.  1 Inhaler  11  . aspirin 81 MG EC tablet Take 81 mg by mouth daily.        [name] atorvastatin (LIPITOR) 10 MG tablet Take 1 tablet (10 mg total) by mouth daily.  90 tablet  3  . budesonide-formoterol (SYMBICORT) 160-4.5 MCG/ACT inhaler Inhale 2 puffs into the lungs 2 (two) times daily.  1 Inhaler  11  . cyclobenzaprine (FLEXERIL) 5 MG tablet Take 1 tablet (5 mg total) by mouth 3 (three) times daily as needed for muscle spasms.  60 tablet  1  . escitalopram (LEXAPRO) 10 MG tablet Take 1 tablet (10 mg total) by mouth daily.  30 tablet  11  . fexofenadine (ALLEGRA) 180 MG tablet Take 180 mg by mouth daily.        . meclizine (ANTIVERT) 25 MG tablet 1/2 - 1 tab by mouth every 6-8 hrs as needed for dizziness  40 tablet  1  . montelukast (SINGULAIR) 10 MG tablet Take 10 mg by mouth at bedtime.      [name]  ondansetron (ZOFRAN) 8 MG tablet 1/2 - 1 tab by mouth every 8 hrs as needed for nausea  40 tablet  1  . tiotropium (SPIRIVA HANDIHALER) 18 MCG inhalation capsule Place 1 capsule (18 mcg total) into inhaler and inhale daily.  30 capsule  11  . traMADol (ULTRAM) 50 MG tablet Take 25-50 mg by mouth every 6 (six) hours as needed. For pain      . escitalopram (LEXAPRO) 10 MG tablet Take 1 tablet (10 mg total) by mouth daily.  30 tablet  11    Review of Systems  Constitutional: Negative for diaphoresis and unexpected weight change.  HENT: Negative for tinnitus.   Eyes: Negative for photophobia and visual disturbance.  Respiratory: Negative for choking and stridor.   Gastrointestinal: Negative for vomiting and blood in stool.  Genitourinary:  Negative for hematuria and decreased urine volume.  Musculoskeletal: Negative for gait problem.  Skin: Negative for color change and wound.  Neurological: Negative for tremors and numbness.  Psychiatric/Behavioral: Negative for decreased concentration. The patient is not hyperactive.       Objective:   Physical Exam BP 118/80  Pulse 76  Temp 97.3 F (36.3 C) (Oral)  Ht 5\' 2"  (1.575 m)  Wt 113 lb 2 oz (51.313 kg)  BMI 20.69 kg/m2  SpO2 94% Physical Exam  VS noted, not ill appearing Constitutional: Pt appears well-developed and well-nourished.  HENT: Head: Normocephalic.  Right Ear: External ear normal.  Left Ear: External ear normal.  Eyes: Conjunctivae and EOM are normal. Pupils are equal, round, and reactive to light.  Neck: Normal range of motion. Neck supple.  Cardiovascular: Normal rate and regular rhythm.   Pulmonary/Chest: Effort normal and breath sounds decresaed bilat with mild wheeze.  Abd:  Soft, NT, non-distended, + BS Neurological: Pt is alert. Not confused  Skin: Skin is warm. No erythema.  No LE edema Psychiatric: Pt behavior is normal. Thought content normal.     Assessment & Plan:

## 2012-06-17 NOTE — Assessment & Plan Note (Signed)
stable overall by hx and exam, , and pt to continue medical treatment as before   

## 2012-06-17 NOTE — Assessment & Plan Note (Signed)
stable overall by hx and exam, most recent data reviewed with pt, and pt to continue medical treatment as before Lab Results  Component Value Date   WBC 8.8 05/07/2012   HGB 15.6* 05/07/2012   HCT 47.0* 05/07/2012   PLT 236.0 05/07/2012   GLUCOSE 89 05/07/2012   CHOL 186 08/26/2011   TRIG 42.0 08/26/2011   HDL 71.90 08/26/2011   LDLDIRECT 196.0 08/15/2010   LDLCALC 106* 08/26/2011   ALT 22 05/07/2012   AST 24 05/07/2012   NA 142 05/07/2012   K 4.6 05/07/2012   CL 100 05/07/2012   CREATININE 0.6 05/07/2012   BUN 16 05/07/2012   CO2 34* 05/07/2012   TSH 0.62 08/26/2011   HGBA1C 5.6 08/01/2011    

## 2012-06-17 NOTE — Assessment & Plan Note (Signed)
Mild to mod, for predpac asd, no s/s infection such as fever or colored sputum,  to f/u any worsening symptoms or concerns

## 2012-07-02 ENCOUNTER — Other Ambulatory Visit: Payer: Self-pay

## 2012-07-02 MED ORDER — MONTELUKAST SODIUM 10 MG PO TABS: 10.0000 mg | ORAL_TABLET | Freq: Every day | ORAL | Status: DC

## 2012-07-14 ENCOUNTER — Encounter: Payer: Self-pay | Admitting: Internal Medicine

## 2012-07-14 ENCOUNTER — Ambulatory Visit (INDEPENDENT_AMBULATORY_CARE_PROVIDER_SITE_OTHER): Payer: Medicare Other | Admitting: Internal Medicine

## 2012-07-14 VITALS — BP 122/70 | HR 83 | Temp 98.7°F | Ht 62.0 in | Wt 114.1 lb

## 2012-07-14 DIAGNOSIS — J449 Chronic obstructive pulmonary disease, unspecified: Secondary | ICD-10-CM

## 2012-07-14 DIAGNOSIS — B029 Zoster without complications: Secondary | ICD-10-CM | POA: Insufficient documentation

## 2012-07-14 DIAGNOSIS — F329 Major depressive disorder, single episode, unspecified: Secondary | ICD-10-CM

## 2012-07-14 MED ORDER — VALACYCLOVIR HCL 1 G PO TABS: 1000.0000 mg | ORAL_TABLET | Freq: Three times a day (TID) | ORAL | Status: DC

## 2012-07-14 NOTE — Patient Instructions (Addendum)
Take all new medications as prescribed - the antibiotic Continue all other medications as before You should stay away from any person who has not been vaccinated or had chicken pox for 7-10 days from dec 16 Thank you for enrolling in MyChart. Please follow the instructions below to securely access your online medical record. MyChart allows you to send messages to your doctor, view your test results, renew your prescriptions, schedule appointments, and more.

## 2012-07-14 NOTE — Assessment & Plan Note (Signed)
stable overall by hx and exam, most recent data reviewed with pt, and pt to continue medical treatment as before Lab Results  Component Value Date   WBC 8.8 05/07/2012   HGB 15.6* 05/07/2012   HCT 47.0* 05/07/2012   PLT 236.0 05/07/2012   GLUCOSE 89 05/07/2012   CHOL 186 08/26/2011   TRIG 42.0 08/26/2011   HDL 71.90 08/26/2011   LDLDIRECT 196.0 08/15/2010   LDLCALC 106* 08/26/2011   ALT 22 05/07/2012   AST 24 05/07/2012   NA 142 05/07/2012   K 4.6 05/07/2012   CL 100 05/07/2012   CREATININE 0.6 05/07/2012   BUN 16 05/07/2012   CO2 34* 05/07/2012   TSH 0.62 08/26/2011   HGBA1C 5.6 08/01/2011

## 2012-07-14 NOTE — Progress Notes (Signed)
Subjective:    Patient ID: Michele Martin, female    DOB: November 22, 1945, 66 y.o.   MRN: [id number]  HPI  Here with 2-3 days onset rash with burning and electric quality to right forehead, without fever, itching but with mult bumps, somewhat tender.  No prior hxk , no topical contacts.  Pt denies chest pain, increased sob or doe, wheezing, orthopnea, PND, increased LE swelling, palpitations, dizziness or syncope.   Pt denies polydipsia, polyuria.  Pt denies new neurological symptoms such as new headache, or facial or extremity weakness or numbness  Overall good compliance with treatment, and good medicine tolerability.  Denies worsening depressive symptoms, suicidal ideation, or panic, though has ongoing anxiety, not increased recently.  Past Medical History  Diagnosis Date  . HYPERLIPIDEMIA 09/19/2009  . ANXIETY 04/15/2010  . DEPRESSION 09/19/2009  . Acute angle-closure glaucoma 02/14/2010  . ALLERGIC RHINITIS 09/19/2009  . CHRONIC OBSTRUCTIVE PULMONARY DISEASE, ACUTE EXACERBATION 12/19/2009  . ASTHMA 09/19/2009  . COPD 06/27/2009  . BACK PAIN 02/14/2010  . TREMOR 02/14/2010  . HOARSENESS 10/17/2009  . Wheezing 06/27/2009  . MENOPAUSAL DISORDER 08/20/2010  . TINNITUS 09/10/2010  . URI 09/10/2010  . OSTEOPENIA 09/10/2010  . Chronic bronchitis 04/14/2011  . Rotator cuff tear 06/23/2011  . Tremor 07/30/2011  . Arthritis   . Complication of anesthesia    Past Surgical History  Procedure Date  . Normal heart cath 2001    Dr. Eden Emms  . Abdominal hysterectomy   . S/p laser tx for glaucoma     no vision loss  . Cervical fusion 2013 - dr Venetia Maxon     reports that she quit smoking about 10 months ago. Her smoking use included Cigarettes. She has a 40 pack-year smoking history. She has never used smokeless tobacco. She reports that she does not drink alcohol or use illicit drugs. family history includes Dementia in her mother. Allergies  Allergen Reactions  . Pravastatin     REACTION: itch and leg cramp   . Simvastatin     REACTION: itch and leg cramps  . Statins    Current Outpatient Prescriptions on File Prior to Visit  Medication Sig Dispense Refill  . albuterol (PROVENTIL HFA;VENTOLIN HFA) 108 (90 BASE) MCG/ACT inhaler Inhale 2 puffs into the lungs every 6 (six) hours as needed for wheezing.  1 Inhaler  11  . aspirin 81 MG EC tablet Take 81 mg by mouth daily.        [name] atorvastatin (LIPITOR) 10 MG tablet Take 1 tablet (10 mg total) by mouth daily.  90 tablet  3  . budesonide-formoterol (SYMBICORT) 160-4.5 MCG/ACT inhaler Inhale 2 puffs into the lungs 2 (two) times daily.  1 Inhaler  11  . cyclobenzaprine (FLEXERIL) 5 MG tablet Take 1 tablet (5 mg total) by mouth 3 (three) times daily as needed for muscle spasms.  60 tablet  1  . escitalopram (LEXAPRO) 10 MG tablet Take 1 tablet (10 mg total) by mouth daily.  30 tablet  11  . fexofenadine (ALLEGRA) 180 MG tablet Take 180 mg by mouth daily.        [name] HYDROcodone-homatropine (HYCODAN) 5-1.5 MG/5ML syrup Take 5 mLs by mouth every 6 (six) hours as needed for cough.  120 mL  1  . meclizine (ANTIVERT) 25 MG tablet 1/2 - 1 tab by mouth every 6-8 hrs as needed for dizziness  40 tablet  1  . montelukast (SINGULAIR) 10 MG tablet Take 1 tablet (10 mg total) by mouth at  bedtime.  30 tablet  11  . predniSONE (DELTASONE) 10 MG tablet 2 tabs by mouth per day for 7 days  14 tablet  0  . tiotropium (SPIRIVA HANDIHALER) 18 MCG inhalation capsule Place 1 capsule (18 mcg total) into inhaler and inhale daily.  30 capsule  11  . traMADol (ULTRAM) 50 MG tablet Take 25-50 mg by mouth every 6 (six) hours as needed. For pain      . escitalopram (LEXAPRO) 10 MG tablet Take 1 tablet (10 mg total) by mouth daily.  30 tablet  11  . ondansetron (ZOFRAN) 8 MG tablet 1/2 - 1 tab by mouth every 8 hrs as needed for nausea  40 tablet  1   Review of Systems  Constitutional: Negative for diaphoresis and unexpected weight change.  HENT: Negative for tinnitus.   Eyes: Negative  for photophobia and visual disturbance.  Respiratory: Negative for choking and stridor.   Gastrointestinal: Negative for vomiting and blood in stool.  Genitourinary: Negative for hematuria and decreased urine volume.  Musculoskeletal: Negative for gait problem.  Skin: Negative for color change and wound.  Neurological: Negative for tremors and numbness.  Psychiatric/Behavioral: Negative for decreased concentration. The patient is not hyperactive.       Objective:   Physical Exam BP 122/70  Pulse 83  Temp 98.7 F (37.1 C) (Oral)  Ht 5\' 2"  (1.575 m)  Wt 114 lb 2 oz (51.767 kg)  BMI 20.87 kg/m2  SpO2 95% Physical Exam  VS noted, not ill appearing Constitutional: Pt appears well-developed and well-nourished.  HENT: Head: Normocephalic.  Right Ear: External ear normal.  Left Ear: External ear normal.  Eyes: Conjunctivae and EOM are normal. Pupils are equal, round, and reactive to light.  Neck: Normal range of motion. Neck supple.  Cardiovascular: Normal rate and regular rhythm.   Pulmonary/Chest: Effort normal and breath sounds decreased, no rales or wheezing.  Abd:  Soft, NT, non-distended, + BS Neurological: Pt is alert. Not confused  Skin: with mult grouped vesicles on erythem base right forehead and temple Psychiatric: Pt behavior is normal. Thought content normal. Not depressed affect    Assessment & Plan:

## 2012-07-14 NOTE — Assessment & Plan Note (Addendum)
Mild to mod, for antibx course,  to f/u any worsening symptoms or concerns, needs to avoid persons not vaccinnated or hx of chickenpox for the next wk

## 2012-07-14 NOTE — Assessment & Plan Note (Signed)
stable overall by hx and exam, most recent data reviewed with pt, and pt to continue medical treatment as before SpO2 Readings from Last 3 Encounters:  07/14/12 95%  06/17/12 94%  05/07/12 96%

## 2012-07-16 ENCOUNTER — Telehealth: Payer: Self-pay

## 2012-07-16 MED ORDER — NYSTATIN 100000 UNIT/ML MT SUSP
500000.0000 [IU] | Freq: Four times a day (QID) | OROMUCOSAL | Status: DC
Start: 2012-07-16 — End: 2012-09-27

## 2012-07-16 NOTE — Telephone Encounter (Signed)
The patient called to inform she did not get the mouth rinse rx sent in from last OV

## 2012-07-16 NOTE — Telephone Encounter (Signed)
Nystatin re-sent erx

## 2012-08-02 ENCOUNTER — Other Ambulatory Visit: Payer: Self-pay

## 2012-08-02 MED ORDER — TRAMADOL HCL 50 MG PO TABS: 25.0000 mg | ORAL_TABLET | Freq: Four times a day (QID) | ORAL | Status: DC | PRN

## 2012-08-02 NOTE — Telephone Encounter (Signed)
Done erx 

## 2012-08-17 ENCOUNTER — Other Ambulatory Visit (INDEPENDENT_AMBULATORY_CARE_PROVIDER_SITE_OTHER): Payer: Medicare Other

## 2012-08-17 DIAGNOSIS — Z0000 Encounter for general adult medical examination without abnormal findings: Secondary | ICD-10-CM

## 2012-08-17 DIAGNOSIS — E785 Hyperlipidemia, unspecified: Secondary | ICD-10-CM

## 2012-08-17 LAB — HEPATIC FUNCTION PANEL
Alkaline Phosphatase: 71 U/L (ref 39–117)
Bilirubin, Direct: 0.1 mg/dL (ref 0.0–0.3)
Total Bilirubin: 0.7 mg/dL (ref 0.3–1.2)

## 2012-08-17 LAB — CBC WITH DIFFERENTIAL/PLATELET
Basophils Relative: 0.5 % (ref 0.0–3.0)
Eosinophils Absolute: 0.2 10*3/uL (ref 0.0–0.7)
Lymphocytes Relative: 20.6 % (ref 12.0–46.0)
MCHC: 33.5 g/dL (ref 30.0–36.0)
MCV: 94.7 fl (ref 78.0–100.0)
Monocytes Absolute: 0.4 10*3/uL (ref 0.1–1.0)
Neutrophils Relative %: 68.4 % (ref 43.0–77.0)
Platelets: 180 10*3/uL (ref 150.0–400.0)
RBC: 4.79 Mil/uL (ref 3.87–5.11)
WBC: 6.2 10*3/uL (ref 4.5–10.5)

## 2012-08-17 LAB — BASIC METABOLIC PANEL
BUN: 16 mg/dL (ref 6–23)
Creatinine, Ser: 0.6 mg/dL (ref 0.4–1.2)
GFR: 114.43 mL/min (ref >60.00)
Glucose, Bld: 90 mg/dL (ref 70–99)
Potassium: 4.3 mEq/L (ref 3.5–5.1)

## 2012-08-17 LAB — URINALYSIS, ROUTINE W REFLEX MICROSCOPIC
Bilirubin Urine: NEGATIVE
Nitrite: NEGATIVE
Urine Glucose: NEGATIVE
pH: 6 (ref 5.0–8.0)

## 2012-08-17 LAB — LIPID PANEL
Cholesterol: 179 mg/dL (ref 0–200)
LDL Cholesterol: 103 mg/dL — ABNORMAL HIGH (ref 0–99)
VLDL: 15.2 mg/dL (ref 0.0–40.0)

## 2012-08-24 ENCOUNTER — Ambulatory Visit (INDEPENDENT_AMBULATORY_CARE_PROVIDER_SITE_OTHER): Payer: Medicare Other | Admitting: Internal Medicine

## 2012-08-24 ENCOUNTER — Encounter: Payer: Self-pay | Admitting: Internal Medicine

## 2012-08-24 VITALS — BP 112/78 | HR 70 | Temp 98.1°F | Ht 62.0 in | Wt 113.0 lb

## 2012-08-24 DIAGNOSIS — J4489 Other specified chronic obstructive pulmonary disease: Secondary | ICD-10-CM

## 2012-08-24 DIAGNOSIS — J449 Chronic obstructive pulmonary disease, unspecified: Secondary | ICD-10-CM | POA: Insufficient documentation

## 2012-08-24 DIAGNOSIS — Z0000 Encounter for general adult medical examination without abnormal findings: Secondary | ICD-10-CM

## 2012-08-24 MED ORDER — METHYLPREDNISOLONE ACETATE 80 MG/ML IJ SUSP
80.0000 mg | Freq: Once | INTRAMUSCULAR | Status: AC
  Administered 2012-08-24: 80 mg via INTRAMUSCULAR

## 2012-08-24 MED ORDER — TIZANIDINE HCL 4 MG PO TABS: 4.0000 mg | ORAL_TABLET | Freq: Four times a day (QID) | ORAL | Status: DC | PRN

## 2012-08-24 NOTE — Assessment & Plan Note (Signed)

## 2012-08-24 NOTE — Assessment & Plan Note (Signed)
Mild for depomedrol IM,  to f/u any worsening symptoms or concerns

## 2012-08-24 NOTE — Progress Notes (Signed)
Subjective:    Patient ID: Michele Martin, female    DOB: November 18, 1945, 67 y.o.   MRN: [id number]  HPI  Here for wellness and f/u;  Overall doing ok;  Pt denies CP, worsening SOB, DOE, wheezing, orthopnea, PND, worsening LE edema, palpitations, dizziness or syncope, except for mild wheezing onset 1-2 days.  Pt denies neurological change such as new headache, facial or extremity weakness.  Pt denies polydipsia, polyuria, or low sugar symptoms. Pt states overall good compliance with treatment and medications, good tolerability, and has been trying to follow lower cholesterol diet.  Pt denies worsening depressive symptoms, suicidal ideation or panic. No fever, night sweats, wt loss, loss of appetite, or other constitutional symptoms.  Pt states good ability with ADL's, has low fall risk, home safety reviewed and adequate, no other significant changes in hearing or vision, and only occasionally active with exercise.  Needs flexeril changed due to other due to cost.  Past Medical History  Diagnosis Date  . HYPERLIPIDEMIA 09/19/2009  . ANXIETY 04/15/2010  . DEPRESSION 09/19/2009  . Acute angle-closure glaucoma 02/14/2010  . ALLERGIC RHINITIS 09/19/2009  . CHRONIC OBSTRUCTIVE PULMONARY DISEASE, ACUTE EXACERBATION 12/19/2009  . ASTHMA 09/19/2009  . COPD 06/27/2009  . BACK PAIN 02/14/2010  . TREMOR 02/14/2010  . HOARSENESS 10/17/2009  . Wheezing 06/27/2009  . MENOPAUSAL DISORDER 08/20/2010  . TINNITUS 09/10/2010  . URI 09/10/2010  . OSTEOPENIA 09/10/2010  . Chronic bronchitis 04/14/2011  . Rotator cuff tear 06/23/2011  . Tremor 07/30/2011  . Arthritis   . Complication of anesthesia    Past Surgical History  Procedure Date  . Normal heart cath 2001    Dr. Eden Emms  . Abdominal hysterectomy   . S/p laser tx for glaucoma     no vision loss  . Cervical fusion 2013 - dr Venetia Maxon     reports that she quit smoking about a year ago. Her smoking use included Cigarettes. She has a 40 pack-year smoking history. She has  never used smokeless tobacco. She reports that she does not drink alcohol or use illicit drugs. family history includes Dementia in her mother. Allergies  Allergen Reactions  . Pravastatin     REACTION: itch and leg cramp  . Simvastatin     REACTION: itch and leg cramps  . Statins    Current Outpatient Prescriptions on File Prior to Visit  Medication Sig Dispense Refill  . albuterol (PROVENTIL HFA;VENTOLIN HFA) 108 (90 BASE) MCG/ACT inhaler Inhale 2 puffs into the lungs every 6 (six) hours as needed for wheezing.  1 Inhaler  11  . aspirin 81 MG EC tablet Take 81 mg by mouth daily.        [name] atorvastatin (LIPITOR) 10 MG tablet Take 1 tablet (10 mg total) by mouth daily.  90 tablet  3  . budesonide-formoterol (SYMBICORT) 160-4.5 MCG/ACT inhaler Inhale 2 puffs into the lungs 2 (two) times daily.  1 Inhaler  11  . escitalopram (LEXAPRO) 10 MG tablet Take 1 tablet (10 mg total) by mouth daily.  30 tablet  11  . fexofenadine (ALLEGRA) 180 MG tablet Take 180 mg by mouth daily.        . meclizine (ANTIVERT) 25 MG tablet 1/2 - 1 tab by mouth every 6-8 hrs as needed for dizziness  40 tablet  1  . montelukast (SINGULAIR) 10 MG tablet Take 1 tablet (10 mg total) by mouth at bedtime.  30 tablet  11  . tiotropium (SPIRIVA HANDIHALER) 18 MCG inhalation capsule  Place 1 capsule (18 mcg total) into inhaler and inhale daily.  30 capsule  11  . traMADol (ULTRAM) 50 MG tablet Take 0.5-1 tablets (25-50 mg total) by mouth every 6 (six) hours as needed. For pain  60 tablet  2  . valACYclovir (VALTREX) 1000 MG tablet Take 1 tablet (1,000 mg total) by mouth 3 (three) times daily.  21 tablet  0  . escitalopram (LEXAPRO) 10 MG tablet Take 1 tablet (10 mg total) by mouth daily.  30 tablet  11  . HYDROcodone-homatropine (HYCODAN) 5-1.5 MG/5ML syrup Take 5 mLs by mouth every 6 (six) hours as needed for cough.  120 mL  1  . nystatin (MYCOSTATIN) 100000 UNIT/ML suspension Take 5 mLs (500,000 Units total) by mouth 4 (four)  times daily. For 10 days as needed  60 mL  1  . ondansetron (ZOFRAN) 8 MG tablet 1/2 - 1 tab by mouth every 8 hrs as needed for nausea  40 tablet  1  . predniSONE (DELTASONE) 10 MG tablet 2 tabs by mouth per day for 7 days  14 tablet  0   Review of Systems Constitutional: Negative for diaphoresis, activity change, appetite change or unexpected weight change.  HENT: Negative for hearing loss, ear pain, facial swelling, mouth sores and neck stiffness.   Eyes: Negative for pain, redness and visual disturbance.  Respiratory: Negative for shortness of breath and wheezing.   Cardiovascular: Negative for chest pain and palpitations.  Gastrointestinal: Negative for diarrhea, blood in stool, abdominal distention or other pain Genitourinary: Negative for hematuria, flank pain or change in urine volume.  Musculoskeletal: Negative for myalgias and joint swelling.  Skin: Negative for color change and wound.  Neurological: Negative for syncope and numbness. other than noted Hematological: Negative for adenopathy.  Psychiatric/Behavioral: Negative for hallucinations, self-injury, decreased concentration and agitation.      Objective:   Physical Exam BP 112/78  Pulse 70  Temp 98.1 F (36.7 C) (Oral)  Ht 5\' 2"  (1.575 m)  Wt 113 lb (51.256 kg)  BMI 20.67 kg/m2  SpO2 93% VS noted,  Constitutional: Pt is oriented to person, place, and time. Appears well-developed and well-nourished.  Head: Normocephalic and atraumatic.  Right Ear: External ear normal.  Left Ear: External ear normal.  Nose: Nose normal.  Mouth/Throat: Oropharynx is clear and moist.  Eyes: Conjunctivae and EOM are normal. Pupils are equal, round, and reactive to light.  Neck: Normal range of motion. Neck supple. No JVD present. No tracheal deviation present.  Cardiovascular: Normal rate, regular rhythm, normal heart sounds and intact distal pulses.   Pulmonary/Chest: Effort normal and breath sounds mild decreased bilat, few wheeze  bilat.  Abdominal: Soft. Bowel sounds are normal. There is no tenderness. No HSM  Musculoskeletal: Normal range of motion. Exhibits no edema.  Lymphadenopathy:  Has no cervical adenopathy.  Neurological: Pt is alert and oriented to person, place, and time. Pt has normal reflexes. No cranial nerve deficit.  Skin: Skin is warm and dry. No rash noted.  Psychiatric:  Has  normal mood and affect. Behavior is normal.     Assessment & Plan:

## 2012-08-24 NOTE — Patient Instructions (Addendum)
You had the steroid shot today OK to stop the flexeril Please start the zanaflex as needed for muscles instead Please continue all other medications as before, and refills have been done if requested. Please have the pharmacy call with any other refills you may need Please continue your efforts at being more active, low cholesterol diet, and weight control. You are otherwise up to date with prevention measures today. Please remember to followup with your GYN for the yearly pap smear and/or mammogram You will be contacted regarding the referral for: colonoscopy Thank you for enrolling in MyChart. Please follow the instructions below to securely access your online medical record. MyChart allows you to send messages to your doctor, view your test results, renew your prescriptions, schedule appointments, and more. Please return in 6 months, or sooner if needed

## 2012-08-27 ENCOUNTER — Encounter: Payer: Self-pay | Admitting: Gastroenterology

## 2012-09-27 ENCOUNTER — Ambulatory Visit (AMBULATORY_SURGERY_CENTER): Payer: Medicare Other | Admitting: *Deleted

## 2012-09-27 VITALS — Ht 62.0 in | Wt 116.8 lb

## 2012-09-27 MED ORDER — NA SULFATE-K SULFATE-MG SULF 17.5-3.13-1.6 GM/177ML PO SOLN: ORAL | Status: DC

## 2012-10-06 ENCOUNTER — Telehealth: Payer: Self-pay

## 2012-10-06 MED ORDER — ATORVASTATIN CALCIUM 10 MG PO TABS: 10.0000 mg | ORAL_TABLET | Freq: Every day | ORAL | Status: DC

## 2012-10-06 NOTE — Telephone Encounter (Signed)
refill 

## 2012-10-11 ENCOUNTER — Telehealth: Payer: Self-pay | Admitting: Internal Medicine

## 2012-10-11 NOTE — Telephone Encounter (Signed)
Patient left message with answering service that she could not make tomorrow's procedure ("friend can't drive in this weather"). She wanted someone to call her back to reschedule. Bonita Quin, please let [facility] know and arrange rescheduling for the patient. Thanks .

## 2012-10-12 ENCOUNTER — Encounter: Payer: Medicare Other | Admitting: Gastroenterology

## 2012-10-12 NOTE — Telephone Encounter (Signed)
Spoke with pt and rescheduled her colon with Dr. Arlyce Dice in the [facility] for 10/18/12. Pt to arrive at [facility] at 1pm for a 2pm appt. Reviewed new prep instruction times with pt and she verbalized understanding.

## 2012-10-12 NOTE — Telephone Encounter (Signed)
[facility] aware and appt cancelled.

## 2012-10-18 ENCOUNTER — Ambulatory Visit (AMBULATORY_SURGERY_CENTER): Payer: Medicare Other | Admitting: Gastroenterology

## 2012-10-18 ENCOUNTER — Encounter: Payer: Self-pay | Admitting: Gastroenterology

## 2012-10-18 VITALS — BP 147/73 | HR 69 | Resp 22

## 2012-10-18 DIAGNOSIS — D126 Benign neoplasm of colon, unspecified: Secondary | ICD-10-CM

## 2012-10-18 DIAGNOSIS — Z1211 Encounter for screening for malignant neoplasm of colon: Secondary | ICD-10-CM

## 2012-10-18 MED ORDER — SODIUM CHLORIDE 0.9 % IV SOLN: 500.0000 mL | INTRAVENOUS | Status: DC

## 2012-10-18 NOTE — Progress Notes (Signed)
Patient did not experience any of the following events: a burn prior to discharge; a fall within the facility; wrong site/side/patient/procedure/implant event; or a hospital transfer or hospital admission upon discharge from the facility. (G8907) Patient did not have preoperative order for IV antibiotic SSI prophylaxis. (G8918)  

## 2012-10-18 NOTE — Patient Instructions (Addendum)

## 2012-10-18 NOTE — Op Note (Signed)
[facility] [address] [place], [address]   COLONOSCOPY PROCEDURE REPORT  PATIENT: Michele Martin, Michele Martin  MR#: [id number] BIRTHDATE: 06-16-1946 , 67  yrs. old GENDER: Female ENDOSCOPIST: Louis Meckel, MD REFERRED NG:EXBMW John, M.D. PROCEDURE DATE:  10/18/2012 PROCEDURE:   Colonoscopy with snare polypectomy and Submucosal injection, any substance ASA CLASS:   Class II INDICATIONS:average risk screening. MEDICATIONS: MAC sedation, administered by CRNA and Propofol (Diprivan) 170 mg IV  DESCRIPTION OF PROCEDURE:   After the risks benefits and alternatives of the procedure were thoroughly explained, informed consent was obtained.  A digital rectal exam revealed no abnormalities of the rectum.   The LB [id number]  endoscope was introduced through the anus and advanced to the cecum, which was identified by both the appendix and ileocecal valve. No adverse events experienced.   The quality of the prep was Suprep excellent The instrument was then slowly withdrawn as the colon was fully examined.      COLON FINDINGS: A sessile polyp measuring 3 mm in size was found in the ascending colon.  A polypectomy was performed with a cold snare.  The resection was complete and the polyp tissue was completely retrieved.   A flat polyp measuring 4 mm in size was found at the hepatic flexure.  A polypectomy was performed with a cold snare.  The resection was complete and the polyp tissue was completely retrieved.   A sessile polyp measuring 15-18 mm in size was found in the descending colon.  A polypectomy was performed using snare cautery and using hot forceps.  The resection was complete and the polyp tissue was completely retrieved.  2 areas both proximal and distal to the polyp were injected submucosally for tattooing with one half cc The colon mucosa was otherwise normal.  Retroflexed views revealed no abnormalities. The time to cecum=3 minutes 11 seconds.   Withdrawal time=12 minutes 22 seconds. The scope was withdrawn and the procedure completed. COMPLICATIONS: There were no complications.  ENDOSCOPIC IMPRESSION: 1.   Sessile polyp measuring 3 mm in size was found in the ascending colon; polypectomy was performed with a cold snare 2.   Flat polyp measuring 4 mm in size was found at the hepatic flexure; polypectomy was performed with a cold snare 3.   Sessile polyp measuring 15-18 mm in size was found in the descending colon; polypectomy was performed using snare cautery and using hot forceps 4.   The colon mucosa was otherwise normal  RECOMMENDATIONS: Await biopsy results   eSigned:  Louis Meckel, MD 10/18/2012 2:49 PM   cc:   PATIENT NAME:  [name], [name] MR#: [id number]

## 2012-10-18 NOTE — Progress Notes (Signed)
Called to room to assist during endoscopic procedure.  Patient ID and intended procedure confirmed with present staff. Received instructions for my participation in the procedure from the performing physician.  

## 2012-10-19 ENCOUNTER — Telehealth: Payer: Self-pay | Admitting: *Deleted

## 2012-10-19 NOTE — Telephone Encounter (Signed)
  Follow up Call-  Call back number 10/18/2012  Post procedure Call Back phone  # [phone]  Permission to leave phone message Yes     Patient questions:  Do you have a fever, pain , or abdominal swelling? no Pain Score  0 *  Have you tolerated food without any problems? yes  Have you been able to return to your normal activities? yes  Do you have any questions about your discharge instructions: Diet   no Medications  no Follow up visit  no  Do you have questions or concerns about your Care? no  Actions: * If pain score is 4 or above: No action needed, pain <4.

## 2012-10-22 ENCOUNTER — Encounter: Payer: Self-pay | Admitting: Gastroenterology

## 2012-10-25 ENCOUNTER — Encounter: Payer: Self-pay | Admitting: Internal Medicine

## 2012-10-25 ENCOUNTER — Ambulatory Visit (INDEPENDENT_AMBULATORY_CARE_PROVIDER_SITE_OTHER): Payer: Medicare Other | Admitting: Internal Medicine

## 2012-10-25 VITALS — BP 110/64 | HR 77 | Temp 97.0°F | Ht 62.0 in | Wt 115.5 lb

## 2012-10-25 DIAGNOSIS — Z860100 Personal history of colon polyps, unspecified: Secondary | ICD-10-CM | POA: Insufficient documentation

## 2012-10-25 DIAGNOSIS — J449 Chronic obstructive pulmonary disease, unspecified: Secondary | ICD-10-CM

## 2012-10-25 DIAGNOSIS — M542 Cervicalgia: Secondary | ICD-10-CM | POA: Insufficient documentation

## 2012-10-25 DIAGNOSIS — F411 Generalized anxiety disorder: Secondary | ICD-10-CM

## 2012-10-25 MED ORDER — OMEPRAZOLE 20 MG PO CPDR: 20.0000 mg | DELAYED_RELEASE_CAPSULE | Freq: Every day | ORAL | Status: DC

## 2012-10-25 NOTE — Assessment & Plan Note (Signed)
stable overall by history and exam, recent data reviewed with pt, and pt to continue medical treatment as before,  to f/u any worsening symptoms or concerns Lab Results  Component Value Date   WBC 6.2 08/17/2012   HGB 15.2* 08/17/2012   HCT 45.4 08/17/2012   PLT 180.0 08/17/2012   GLUCOSE 90 08/17/2012   CHOL 179 08/17/2012   TRIG 76.0 08/17/2012   HDL 60.60 08/17/2012   LDLDIRECT 196.0 08/15/2010   LDLCALC 103* 08/17/2012   ALT 16 08/17/2012   AST 21 08/17/2012   NA 140 08/17/2012   K 4.3 08/17/2012   CL 104 08/17/2012   CREATININE 0.6 08/17/2012   BUN 16 08/17/2012   CO2 31 08/17/2012   TSH 0.96 08/17/2012   HGBA1C 5.6 08/01/2011    

## 2012-10-25 NOTE — Assessment & Plan Note (Addendum)
Unclear etiology, hx and exam unrevealing, will ask for neck u/s, but also PPI omeprazole 20 qd for 1 mo , for f/u any worsening, consdier CT or ENT

## 2012-10-25 NOTE — Progress Notes (Signed)
Subjective:    Patient ID: Michele Martin, female    DOB: Feb 15, 1946, 67 y.o.   MRN: [id number]  HPI  Here with 4 wks anterior neck discomfort and pain on swallowing "like I 'm being choked" without actual solid or liquid dysphagia, choking or n/v. Denies worsening reflux, abd pain, dysphagia, n/v, bowel change or blood.  Wt overall stable.  Does treadmill daily without change in appetite or exercise tolerance. Still has some sob at baseline no change recently.  No antacid use or PPI.  No ETOH use.  No recent ENT eval recently. Hs not noticed new lumps to neck. Denies post neck pain. Not exertional and Pt denies chest pain, increased sob or doe, wheezing, orthopnea, PND, increased LE swelling, palpitations, dizziness or syncope.  Pt denies fever, wt loss, night sweats, loss of appetite, or other constitutional symptoms Denies worsening depressive symptoms, suicidal ideation, or panic; has ongoing anxiety Past Medical History  Diagnosis Date  . HYPERLIPIDEMIA 09/19/2009  . ANXIETY 04/15/2010  . DEPRESSION 09/19/2009  . Acute angle-closure glaucoma 02/14/2010  . ALLERGIC RHINITIS 09/19/2009  . CHRONIC OBSTRUCTIVE PULMONARY DISEASE, ACUTE EXACERBATION 12/19/2009  . ASTHMA 09/19/2009  . COPD 06/27/2009  . BACK PAIN 02/14/2010  . TREMOR 02/14/2010  . HOARSENESS 10/17/2009  . Wheezing 06/27/2009  . MENOPAUSAL DISORDER 08/20/2010  . TINNITUS 09/10/2010  . URI 09/10/2010  . OSTEOPENIA 09/10/2010  . Chronic bronchitis 04/14/2011  . Rotator cuff tear 06/23/2011  . Tremor 07/30/2011  . Arthritis   . Complication of anesthesia    Past Surgical History  Procedure Laterality Date  . Normal heart cath  2001    Dr. Eden Emms  . Abdominal hysterectomy    . S/p laser tx for glaucoma      no vision loss  . Cervical fusion 2013 - dr Venetia Maxon      reports that she quit smoking about 13 months ago. Her smoking use included Cigarettes. She has a 40 pack-year smoking history. She has never used smokeless tobacco. She  reports that she does not drink alcohol or use illicit drugs. family history includes Dementia in her mother.  There is no history of Colon cancer. Allergies  Allergen Reactions  . Pravastatin     REACTION: itch and leg cramp  . Simvastatin     REACTION: itch and leg cramps  . Statins    Current Outpatient Prescriptions on File Prior to Visit  Medication Sig Dispense Refill  . albuterol (PROVENTIL HFA;VENTOLIN HFA) 108 (90 BASE) MCG/ACT inhaler Inhale 2 puffs into the lungs every 6 (six) hours as needed for wheezing.  1 Inhaler  11  . aspirin 81 MG EC tablet Take 81 mg by mouth daily.        [name] atorvastatin (LIPITOR) 10 MG tablet Take 1 tablet (10 mg total) by mouth daily.  90 tablet  3  . budesonide-formoterol (SYMBICORT) 160-4.5 MCG/ACT inhaler Inhale 2 puffs into the lungs 2 (two) times daily.  1 Inhaler  11  . escitalopram (LEXAPRO) 10 MG tablet Take 1 tablet (10 mg total) by mouth daily.  30 tablet  11  . fexofenadine (ALLEGRA) 180 MG tablet Take 180 mg by mouth daily.        . montelukast (SINGULAIR) 10 MG tablet Take 1 tablet (10 mg total) by mouth at bedtime.  30 tablet  11  . ondansetron (ZOFRAN) 8 MG tablet 1/2 - 1 tab by mouth every 8 hrs as needed for nausea  40 tablet  1  .  tiotropium (SPIRIVA HANDIHALER) 18 MCG inhalation capsule Place 1 capsule (18 mcg total) into inhaler and inhale daily.  30 capsule  11  . tiZANidine (ZANAFLEX) 4 MG tablet Take 1 tablet (4 mg total) by mouth every 6 (six) hours as needed.  60 tablet  2  . traMADol (ULTRAM) 50 MG tablet Take 0.5-1 tablets (25-50 mg total) by mouth every 6 (six) hours as needed. For pain  60 tablet  2  . escitalopram (LEXAPRO) 10 MG tablet Take 1 tablet (10 mg total) by mouth daily.  30 tablet  11   No current facility-administered medications on file prior to visit.    Review of Systems  Constitutional: Negative for unexpected weight change, or unusual diaphoresis  HENT: Negative for tinnitus.   Eyes: Negative for  photophobia and visual disturbance.  Respiratory: Negative for choking and stridor.   Gastrointestinal: Negative for vomiting and blood in stool.  Genitourinary: Negative for hematuria and decreased urine volume.  Musculoskeletal: Negative for acute joint swelling Skin: Negative for color change and wound.  Neurological: Negative for tremors and numbness other than noted  Psychiatric/Behavioral: Negative for decreased concentration or  hyperactivity.       Objective:   Physical Exam BP 110/64  Pulse 77  Temp(Src) 97 F (36.1 C) (Oral)  Ht 5\' 2"  (1.575 m)  Wt 115 lb 8 oz (52.39 kg)  BMI 21.12 kg/m2  SpO2 96% VS noted, not ill appearing  Constitutional: Pt appears well-developed and well-nourished.but on thin side  HENT: Head: NCAT.  Right Ear: External ear normal.  Left Ear: External ear normal.  Eyes: Conjunctivae and EOM are normal. Pupils are equal, round, and reactive to light.  Neck: Normal range of motion. Neck supple. no mass noted, has som tenderness to area post to both angles of jaw Cardiovascular: Normal rate and regular rhythm.   Pulmonary/Chest: Effort normal and breath sounds decreased bilat ,no wheeze.  Abd:  Soft, NT, non-distended, + BS Neurological: Pt is alert. Not confused  Skin: Skin is warm. No erythema.  Psychiatric: Pt behavior is normal. Thought content normal. Minor nervous today    Assessment & Plan:

## 2012-10-25 NOTE — Assessment & Plan Note (Signed)
stable overall by history and exam, recent data reviewed with pt, and pt to continue medical treatment as before,  to f/u any worsening symptoms or concerns SpO2 Readings from Last 3 Encounters:  10/25/12 96%  10/18/12 96%  08/24/12 93%

## 2012-10-25 NOTE — Patient Instructions (Signed)
Please take all new medication as prescribed - the prilosec Please continue all other medications as before, and refills have been done if requested. You will be contacted regarding the referral for: ultrasound for the neck Please return for any worsening or new lumps or masses

## 2012-10-27 ENCOUNTER — Encounter: Payer: Self-pay | Admitting: Internal Medicine

## 2012-10-27 ENCOUNTER — Ambulatory Visit
Admission: RE | Admit: 2012-10-27 | Discharge: 2012-10-27 | Disposition: A | Discharge status: Home / Self Care | Payer: Medicare Other | Source: Ambulatory Visit | Attending: Internal Medicine | Admitting: Internal Medicine

## 2012-10-27 DIAGNOSIS — M542 Cervicalgia: Secondary | ICD-10-CM

## 2012-12-22 ENCOUNTER — Ambulatory Visit (INDEPENDENT_AMBULATORY_CARE_PROVIDER_SITE_OTHER): Payer: Medicare Other | Admitting: Internal Medicine

## 2012-12-22 ENCOUNTER — Encounter: Payer: Self-pay | Admitting: Internal Medicine

## 2012-12-22 ENCOUNTER — Other Ambulatory Visit (INDEPENDENT_AMBULATORY_CARE_PROVIDER_SITE_OTHER): Payer: Medicare Other

## 2012-12-22 VITALS — BP 130/82 | HR 81 | Temp 99.8°F | Ht 62.0 in | Wt 116.1 lb

## 2012-12-22 DIAGNOSIS — J449 Chronic obstructive pulmonary disease, unspecified: Secondary | ICD-10-CM

## 2012-12-22 DIAGNOSIS — F329 Major depressive disorder, single episode, unspecified: Secondary | ICD-10-CM

## 2012-12-22 DIAGNOSIS — R5381 Other malaise: Secondary | ICD-10-CM

## 2012-12-22 DIAGNOSIS — F411 Generalized anxiety disorder: Secondary | ICD-10-CM

## 2012-12-22 DIAGNOSIS — R531 Weakness: Secondary | ICD-10-CM | POA: Insufficient documentation

## 2012-12-22 DIAGNOSIS — R0902 Hypoxemia: Secondary | ICD-10-CM

## 2012-12-22 LAB — URINALYSIS, ROUTINE W REFLEX MICROSCOPIC
Bilirubin Urine: NEGATIVE
Hgb urine dipstick: NEGATIVE
Ketones, ur: NEGATIVE
Leukocytes, UA: NEGATIVE
Specific Gravity, Urine: 1.015 (ref 1.000–1.030)
Urobilinogen, UA: 0.2 (ref 0.0–1.0)

## 2012-12-22 LAB — HEPATIC FUNCTION PANEL
AST: 20 U/L (ref 0–37)
Albumin: 3.9 g/dL (ref 3.5–5.2)
Alkaline Phosphatase: 79 U/L (ref 39–117)
Total Protein: 7 g/dL (ref 6.0–8.3)

## 2012-12-22 LAB — BASIC METABOLIC PANEL
CO2: 33 mEq/L — ABNORMAL HIGH (ref 19–32)
Chloride: 101 mEq/L (ref 96–112)
GFR: 116.72 mL/min (ref >60.00)
Glucose, Bld: 81 mg/dL (ref 70–99)
Potassium: 4 mEq/L (ref 3.5–5.1)
Sodium: 140 mEq/L (ref 135–145)

## 2012-12-22 LAB — CBC WITH DIFFERENTIAL/PLATELET
Basophils Absolute: 0 10*3/uL (ref 0.0–0.1)
HCT: 47.5 % — ABNORMAL HIGH (ref 36.0–46.0)
Lymphs Abs: 1.3 10*3/uL (ref 0.7–4.0)
Monocytes Relative: 8.2 % (ref 3.0–12.0)
Platelets: 195 10*3/uL (ref 150.0–400.0)
RDW: 13 % (ref 11.5–14.6)

## 2012-12-22 LAB — TSH: TSH: 0.84 u[IU]/mL (ref 0.35–5.50)

## 2012-12-22 LAB — VITAMIN B12: Vitamin B-12: 597 pg/mL (ref 211–911)

## 2012-12-22 MED ORDER — CLONAZEPAM 0.5 MG PO TABS: 0.5000 mg | ORAL_TABLET | Freq: Two times a day (BID) | ORAL | Status: DC | PRN

## 2012-12-22 NOTE — Assessment & Plan Note (Signed)
Most likely related to ambulatory desat's, but r/o other with labs today

## 2012-12-22 NOTE — Assessment & Plan Note (Addendum)
With ambulatory desat significant in the setting of stable dz; will need Home o2 1L with exertion and at night  Note:  Total time for pt hx, exam, review of record with pt in the room, determination of diagnoses and plan for further eval and tx is > 40 min, with over 50% spent in coordination and counseling of patient

## 2012-12-22 NOTE — Assessment & Plan Note (Signed)
O/w stable,  SpO2 Readings from Last 3 Encounters:  12/22/12 91%  10/25/12 96%  10/18/12 96%   stable overall by history and exam, recent data reviewed with pt, and pt to continue medical treatment as before,  to f/u any worsening symptoms or concerns

## 2012-12-22 NOTE — Patient Instructions (Signed)
Please take all new medication as prescribed - the klonopin for nerves Please continue all other medications as before Please see the [facility] today before leaving to arrange the Home Oxygen - for 1L with exertion, and at night Please go to the LAB in the Basement (turn left off the elevator) for the tests to be done today You will be contacted by phone if any changes need to be made immediately.  Otherwise, you will receive a letter about your results with an explanation, but please check with MyChart first. You will be contacted regarding the referral for: outpatient physical therapy  Danae Chen you for enrolling in MyChart. Please follow the instructions below to securely access your online medical record. MyChart allows you to send messages to your doctor, view your test results, renew your prescriptions, schedule appointments, and more.  Please return in 1 months, or sooner if needed

## 2012-12-22 NOTE — Assessment & Plan Note (Signed)
Ok for klonopin  Bid prn,  to f/u any worsening symptoms or concerns

## 2012-12-22 NOTE — Assessment & Plan Note (Signed)
stable overall by history and exam, recent data reviewed with pt, and pt to continue medical treatment as before,  to f/u any worsening symptoms or concerns Lab Results  Component Value Date   WBC 6.2 08/17/2012   HGB 15.2* 08/17/2012   HCT 45.4 08/17/2012   PLT 180.0 08/17/2012   GLUCOSE 90 08/17/2012   CHOL 179 08/17/2012   TRIG 76.0 08/17/2012   HDL 60.60 08/17/2012   LDLDIRECT 196.0 08/15/2010   LDLCALC 103* 08/17/2012   ALT 16 08/17/2012   AST 21 08/17/2012   NA 140 08/17/2012   K 4.3 08/17/2012   CL 104 08/17/2012   CREATININE 0.6 08/17/2012   BUN 16 08/17/2012   CO2 31 08/17/2012   TSH 0.96 08/17/2012   HGBA1C 5.6 08/01/2011

## 2012-12-22 NOTE — Progress Notes (Signed)
Subjective:    Patient ID: Michele Martin, female    DOB: [birth year], 67 y.o.   MRN: [id number]  HPI Here with c/o graduall worsening general weakness for 2-3 mo, now to the point she cant ignore it.  Normally vigorous for age doing yardwork and such, and Pt denies chest pain, increased sob or doe, wheezing, orthopnea, PND, increased LE swelling, palpitations, dizziness or syncope.  Overall good compliance with treatment, and good medicine tolerability, including her inhalers.  Pt denies new neurological symptoms such as new headache, or facial or extremity weakness or numbness   Pt denies polydipsia, polyuria.  Here after she simply wasn't able to do the pull cord on the attic stairs and climb up the stairs, now with increased anxiety as well.  Denies worsening depressive symptoms, suicidal ideation, or panic.  No recent prolonged steroid use, or muscle pain or myopathy or neuoro related symptoms Past Medical History  Diagnosis Date  . HYPERLIPIDEMIA 09/19/2009  . ANXIETY 04/15/2010  . DEPRESSION 09/19/2009  . Acute angle-closure glaucoma 02/14/2010  . ALLERGIC RHINITIS 09/19/2009  . CHRONIC OBSTRUCTIVE PULMONARY DISEASE, ACUTE EXACERBATION 12/19/2009  . ASTHMA 09/19/2009  . COPD 06/27/2009  . BACK PAIN 02/14/2010  . TREMOR 02/14/2010  . HOARSENESS 10/17/2009  . Wheezing 06/27/2009  . MENOPAUSAL DISORDER 08/20/2010  . TINNITUS 09/10/2010  . URI 09/10/2010  . OSTEOPENIA 09/10/2010  . Chronic bronchitis 04/14/2011  . Rotator cuff tear 06/23/2011  . Tremor 07/30/2011  . Arthritis   . Complication of anesthesia    Past Surgical History  Procedure Laterality Date  . Normal heart cath  2001    Dr. Eden Emms  . Abdominal hysterectomy    . S/p laser tx for glaucoma      no vision loss  . Cervical fusion 2013 - dr Venetia Maxon      reports that she quit smoking about 15 months ago. Her smoking use included Cigarettes. She has a 40 pack-year smoking history. She has never used smokeless tobacco. She reports  that she does not drink alcohol or use illicit drugs. family history includes Dementia in her mother.  There is no history of Colon cancer. Allergies  Allergen Reactions  . Pravastatin     REACTION: itch and leg cramp  . Simvastatin     REACTION: itch and leg cramps  . Statins    Current Outpatient Prescriptions on File Prior to Visit  Medication Sig Dispense Refill  . albuterol (PROVENTIL HFA;VENTOLIN HFA) 108 (90 BASE) MCG/ACT inhaler Inhale 2 puffs into the lungs every 6 (six) hours as needed for wheezing.  1 Inhaler  11  . aspirin 81 MG EC tablet Take 81 mg by mouth daily.        [name] atorvastatin (LIPITOR) 10 MG tablet Take 1 tablet (10 mg total) by mouth daily.  90 tablet  3  . budesonide-formoterol (SYMBICORT) 160-4.5 MCG/ACT inhaler Inhale 2 puffs into the lungs 2 (two) times daily.  1 Inhaler  11  . escitalopram (LEXAPRO) 10 MG tablet Take 1 tablet (10 mg total) by mouth daily.  30 tablet  11  . fexofenadine (ALLEGRA) 180 MG tablet Take 180 mg by mouth daily.        . montelukast (SINGULAIR) 10 MG tablet Take 1 tablet (10 mg total) by mouth at bedtime.  30 tablet  11  . omeprazole (PRILOSEC) 20 MG capsule Take 1 capsule (20 mg total) by mouth daily.  30 capsule  3  . ondansetron (ZOFRAN) 8 MG tablet  1/2 - 1 tab by mouth every 8 hrs as needed for nausea  40 tablet  1  . tiotropium (SPIRIVA HANDIHALER) 18 MCG inhalation capsule Place 1 capsule (18 mcg total) into inhaler and inhale daily.  30 capsule  11  . tiZANidine (ZANAFLEX) 4 MG tablet Take 1 tablet (4 mg total) by mouth every 6 (six) hours as needed.  60 tablet  2  . traMADol (ULTRAM) 50 MG tablet Take 0.5-1 tablets (25-50 mg total) by mouth every 6 (six) hours as needed. For pain  60 tablet  2  . escitalopram (LEXAPRO) 10 MG tablet Take 1 tablet (10 mg total) by mouth daily.  30 tablet  11   No current facility-administered medications on file prior to visit.    Review of Systems  Constitutional: Negative for unexpected  weight change, or unusual diaphoresis  HENT: Negative for tinnitus.   Eyes: Negative for photophobia and visual disturbance.  Respiratory: Negative for choking and stridor.   Gastrointestinal: Negative for vomiting and blood in stool.  Genitourinary: Negative for hematuria and decreased urine volume.  Musculoskeletal: Negative for acute joint swelling Skin: Negative for color change and wound.  Neurological: Negative for tremors and numbness other than noted  Psychiatric/Behavioral: Negative for decreased concentration or  hyperactivity.       Objective:   Physical Exam BP 130/82  Pulse 81  Temp(Src) 99.8 F (37.7 C) (Oral)  Ht 5\' 2"  (1.575 m)  Wt 116 lb 2 oz (52.674 kg)  BMI 21.23 kg/m2  SpO2 91%;  Ambulatory O2 sat is 87% at 162ft walking pace VS noted, not ill appearing Constitutional: Pt appears thin for ht  HENT: Head: NCAT.  Right Ear: External ear normal.  Left Ear: External ear normal.  Eyes: Conjunctivae and EOM are normal. Pupils are equal, round, and reactive to light.  Neck: Normal range of motion. Neck supple.  Cardiovascular: Normal rate and regular rhythm.   Pulmonary/Chest: Effort normal and breath sounds marked decreasd but  No rales or wheezing  Abd:  Soft, NT, non-distended, + BS Neurological: Pt is alert. Not confused , motor/dtr/gait intact, though has general weakness Skin: Skin is warm. No erythema.  Psychiatric: behavior is normal. Thought content normal.      Assessment & Plan:

## 2012-12-27 ENCOUNTER — Ambulatory Visit: Payer: Medicare Other | Attending: Internal Medicine | Admitting: Rehabilitative and Restorative Service Providers"

## 2012-12-27 DIAGNOSIS — Z9181 History of falling: Secondary | ICD-10-CM | POA: Insufficient documentation

## 2012-12-27 DIAGNOSIS — R269 Unspecified abnormalities of gait and mobility: Secondary | ICD-10-CM | POA: Insufficient documentation

## 2012-12-27 DIAGNOSIS — IMO0001 Reserved for inherently not codable concepts without codable children: Secondary | ICD-10-CM | POA: Insufficient documentation

## 2012-12-27 DIAGNOSIS — M6281 Muscle weakness (generalized): Secondary | ICD-10-CM | POA: Insufficient documentation

## 2012-12-31 ENCOUNTER — Other Ambulatory Visit: Payer: Self-pay

## 2012-12-31 MED ORDER — BUDESONIDE-FORMOTEROL FUMARATE 160-4.5 MCG/ACT IN AERO: 2.0000 | INHALATION_SPRAY | Freq: Two times a day (BID) | RESPIRATORY_TRACT | Status: DC

## 2013-01-03 ENCOUNTER — Ambulatory Visit: Payer: Medicare Other | Admitting: Physical Therapy

## 2013-01-06 ENCOUNTER — Ambulatory Visit: Payer: Medicare Other | Admitting: Physical Therapy

## 2013-01-10 ENCOUNTER — Ambulatory Visit: Payer: Medicare Other | Admitting: Rehabilitative and Restorative Service Providers"

## 2013-01-13 ENCOUNTER — Ambulatory Visit: Payer: Medicare Other | Admitting: Physical Therapy

## 2013-01-17 ENCOUNTER — Ambulatory Visit: Payer: Medicare Other | Admitting: Physical Therapy

## 2013-01-19 ENCOUNTER — Encounter: Payer: Self-pay | Admitting: Internal Medicine

## 2013-01-19 ENCOUNTER — Ambulatory Visit (INDEPENDENT_AMBULATORY_CARE_PROVIDER_SITE_OTHER): Payer: Medicare Other | Admitting: Internal Medicine

## 2013-01-19 VITALS — BP 122/80 | HR 73 | Temp 97.7°F | Ht 62.0 in | Wt 115.0 lb

## 2013-01-19 DIAGNOSIS — F411 Generalized anxiety disorder: Secondary | ICD-10-CM

## 2013-01-19 DIAGNOSIS — F17200 Nicotine dependence, unspecified, uncomplicated: Secondary | ICD-10-CM

## 2013-01-19 DIAGNOSIS — R0902 Hypoxemia: Secondary | ICD-10-CM

## 2013-01-19 DIAGNOSIS — Z0000 Encounter for general adult medical examination without abnormal findings: Secondary | ICD-10-CM

## 2013-01-19 DIAGNOSIS — J449 Chronic obstructive pulmonary disease, unspecified: Secondary | ICD-10-CM

## 2013-01-19 NOTE — Assessment & Plan Note (Signed)
Nicely improved with the klonopin, Please continue all other medications as before

## 2013-01-19 NOTE — Assessment & Plan Note (Signed)
Not clear why, but has overall improved with less desat by hx, only using home 02 at night , stable overall by history and exam,, and pt to continue medical treatment as before,  to f/u any worsening symptoms or concerns SpO2 Readings from Last 3 Encounters:  01/19/13 92%  12/22/12 91%  10/25/12 96%

## 2013-01-19 NOTE — Progress Notes (Signed)
Subjective:    Patient ID: Michele Martin, female    DOB: [birth year], 67 y.o.   MRN: [id number]  HPI  Here to f/u; overall doing ok,  Pt denies chest pain, increased sob or doe, wheezing, orthopnea, PND, increased LE swelling, palpitations, dizziness or syncope.  Pt denies polydipsia, polyuria, or low sugar symptoms such as weakness or confusion improved with po intake.  Pt denies new neurological symptoms such as new headache, or facial or extremity weakness or numbness.   Pt states overall good compliance with meds, has been trying to follow lower cholesterol, diabetic diet, with wt overall stable,  And oxygen improved with ambulation with home health, only using o2 at night now.  Also changed to ecigs and nicotine lozenges prn to avoid wt gain.   Past Medical History  Diagnosis Date  . HYPERLIPIDEMIA 09/19/2009  . ANXIETY 04/15/2010  . DEPRESSION 09/19/2009  . Acute angle-closure glaucoma 02/14/2010  . ALLERGIC RHINITIS 09/19/2009  . CHRONIC OBSTRUCTIVE PULMONARY DISEASE, ACUTE EXACERBATION 12/19/2009  . ASTHMA 09/19/2009  . COPD 06/27/2009  . BACK PAIN 02/14/2010  . TREMOR 02/14/2010  . HOARSENESS 10/17/2009  . Wheezing 06/27/2009  . MENOPAUSAL DISORDER 08/20/2010  . TINNITUS 09/10/2010  . URI 09/10/2010  . OSTEOPENIA 09/10/2010  . Chronic bronchitis 04/14/2011  . Rotator cuff tear 06/23/2011  . Tremor 07/30/2011  . Arthritis   . Complication of anesthesia    Past Surgical History  Procedure Laterality Date  . Normal heart cath  2001    Dr. Eden Emms  . Abdominal hysterectomy    . S/p laser tx for glaucoma      no vision loss  . Cervical fusion 2013 - dr Venetia Maxon      reports that she quit smoking about 16 months ago. Her smoking use included Cigarettes. She has a 40 pack-year smoking history. She has never used smokeless tobacco. She reports that she does not drink alcohol or use illicit drugs. family history includes Dementia in her mother.  There is no history of Colon cancer. Allergies   Allergen Reactions  . Pravastatin     REACTION: itch and leg cramp  . Simvastatin     REACTION: itch and leg cramps  . Statins    Current Outpatient Prescriptions on File Prior to Visit  Medication Sig Dispense Refill  . aspirin 81 MG EC tablet Take 81 mg by mouth daily.        [name] atorvastatin (LIPITOR) 10 MG tablet Take 1 tablet (10 mg total) by mouth daily.  90 tablet  3  . budesonide-formoterol (SYMBICORT) 160-4.5 MCG/ACT inhaler Inhale 2 puffs into the lungs 2 (two) times daily.  1 Inhaler  11  . clonazePAM (KLONOPIN) 0.5 MG tablet Take 1 tablet (0.5 mg total) by mouth 2 (two) times daily as needed for anxiety.  60 tablet  2  . escitalopram (LEXAPRO) 10 MG tablet Take 1 tablet (10 mg total) by mouth daily.  30 tablet  11  . fexofenadine (ALLEGRA) 180 MG tablet Take 180 mg by mouth daily.        . montelukast (SINGULAIR) 10 MG tablet Take 1 tablet (10 mg total) by mouth at bedtime.  30 tablet  11  . omeprazole (PRILOSEC) 20 MG capsule Take 1 capsule (20 mg total) by mouth daily.  30 capsule  3  . ondansetron (ZOFRAN) 8 MG tablet 1/2 - 1 tab by mouth every 8 hrs as needed for nausea  40 tablet  1  . tiotropium (SPIRIVA  HANDIHALER) 18 MCG inhalation capsule Place 1 capsule (18 mcg total) into inhaler and inhale daily.  30 capsule  11  . tiZANidine (ZANAFLEX) 4 MG tablet Take 1 tablet (4 mg total) by mouth every 6 (six) hours as needed.  60 tablet  2  . traMADol (ULTRAM) 50 MG tablet Take 0.5-1 tablets (25-50 mg total) by mouth every 6 (six) hours as needed. For pain  60 tablet  2  . escitalopram (LEXAPRO) 10 MG tablet Take 1 tablet (10 mg total) by mouth daily.  30 tablet  11   No current facility-administered medications on file prior to visit.   Review of Systems  Constitutional: Negative for unexpected weight change, or unusual diaphoresis  HENT: Negative for tinnitus.   Eyes: Negative for photophobia and visual disturbance.  Respiratory: Negative for choking and stridor.    Gastrointestinal: Negative for vomiting and blood in stool.  Genitourinary: Negative for hematuria and decreased urine volume.  Musculoskeletal: Negative for acute joint swelling Skin: Negative for color change and wound.  Neurological: Negative for tremors and numbness other than noted  Psychiatric/Behavioral: Negative for decreased concentration or  hyperactivity.       Objective:   Physical Exam BP 122/80  Pulse 73  Temp(Src) 97.7 F (36.5 C) (Oral)  Ht 5\' 2"  (1.575 m)  Wt 115 lb (52.164 kg)  BMI 21.03 kg/m2  SpO2 92% VS noted,  Constitutional: Pt appears well-developed and well-nourished.  HENT: Head: NCAT.  Right Ear: External ear normal.  Left Ear: External ear normal.  Eyes: Conjunctivae and EOM are normal. Pupils are equal, round, and reactive to light.  Neck: Normal range of motion. Neck supple.  Cardiovascular: Normal rate and regular rhythm.   Pulmonary/Chest: Effort normal and breath sounds decreased without wheeze., rales Neurological: Pt is alert. Not confused  Skin: Skin is warm. No erythema.  Psychiatric: Pt behavior is normal. Thought content normal. much less nervous    Assessment & Plan:

## 2013-01-19 NOTE — Assessment & Plan Note (Signed)
Now smoking ecigs, ok to follow

## 2013-01-19 NOTE — Assessment & Plan Note (Signed)
stable overall by history and exam, recent data reviewed with pt, and pt to continue medical treatment as before,  to f/u any worsening symptoms or concerns SpO2 Readings from Last 3 Encounters:  01/19/13 92%  12/22/12 91%  10/25/12 96%

## 2013-01-19 NOTE — Patient Instructions (Addendum)
No need for further changes today Please continue all other medications as before, and refills have been done if requested. OK to use the Home O2 at night only Please keep your appointments with your specialists as you may have planned  Please remember to sign up for My Chart if you have not done so, as this will be important to you in the future with finding out test results, communicating by private email, and scheduling acute appointments online when needed.  Please return in 6 months, or sooner if needed, with Lab testing done 3-5 days before

## 2013-01-20 ENCOUNTER — Ambulatory Visit: Payer: Medicare Other | Admitting: Physical Therapy

## 2013-01-24 ENCOUNTER — Ambulatory Visit: Payer: Medicare Other | Admitting: Rehabilitative and Restorative Service Providers"

## 2013-01-26 ENCOUNTER — Ambulatory Visit: Payer: Medicare Other | Attending: Internal Medicine | Admitting: Physical Therapy

## 2013-01-26 DIAGNOSIS — R269 Unspecified abnormalities of gait and mobility: Secondary | ICD-10-CM | POA: Insufficient documentation

## 2013-01-26 DIAGNOSIS — M6281 Muscle weakness (generalized): Secondary | ICD-10-CM | POA: Insufficient documentation

## 2013-01-26 DIAGNOSIS — Z9181 History of falling: Secondary | ICD-10-CM | POA: Insufficient documentation

## 2013-01-26 DIAGNOSIS — IMO0001 Reserved for inherently not codable concepts without codable children: Secondary | ICD-10-CM | POA: Insufficient documentation

## 2013-02-04 ENCOUNTER — Other Ambulatory Visit: Payer: Self-pay

## 2013-02-04 DIAGNOSIS — F329 Major depressive disorder, single episode, unspecified: Secondary | ICD-10-CM

## 2013-02-04 MED ORDER — ESCITALOPRAM OXALATE 10 MG PO TABS: 10.0000 mg | ORAL_TABLET | Freq: Every day | ORAL | Status: DC

## 2013-02-15 ENCOUNTER — Other Ambulatory Visit: Payer: Self-pay

## 2013-02-15 DIAGNOSIS — J441 Chronic obstructive pulmonary disease with (acute) exacerbation: Secondary | ICD-10-CM

## 2013-02-15 MED ORDER — TIOTROPIUM BROMIDE MONOHYDRATE 18 MCG IN CAPS: 18.0000 ug | ORAL_CAPSULE | Freq: Every day | RESPIRATORY_TRACT | Status: DC

## 2013-03-02 ENCOUNTER — Other Ambulatory Visit: Payer: Self-pay

## 2013-03-14 ENCOUNTER — Telehealth: Payer: Self-pay | Admitting: Internal Medicine

## 2013-03-14 DIAGNOSIS — J449 Chronic obstructive pulmonary disease, unspecified: Secondary | ICD-10-CM

## 2013-03-14 NOTE — Telephone Encounter (Signed)
Patient informed. 

## 2013-03-14 NOTE — Telephone Encounter (Signed)
Pt thought she was going to have a referral to pulmonary rehab for COPD.  She wants to do this.

## 2013-03-14 NOTE — Telephone Encounter (Signed)
Done per emr 

## 2013-03-22 ENCOUNTER — Other Ambulatory Visit: Payer: Self-pay | Admitting: *Deleted

## 2013-03-22 DIAGNOSIS — J441 Chronic obstructive pulmonary disease with (acute) exacerbation: Secondary | ICD-10-CM

## 2013-03-22 MED ORDER — BUDESONIDE-FORMOTEROL FUMARATE 160-4.5 MCG/ACT IN AERO: 2.0000 | INHALATION_SPRAY | Freq: Two times a day (BID) | RESPIRATORY_TRACT | Status: DC

## 2013-03-22 MED ORDER — TIOTROPIUM BROMIDE MONOHYDRATE 18 MCG IN CAPS: 18.0000 ug | ORAL_CAPSULE | Freq: Every day | RESPIRATORY_TRACT | Status: DC

## 2013-03-31 ENCOUNTER — Ambulatory Visit (INDEPENDENT_AMBULATORY_CARE_PROVIDER_SITE_OTHER): Payer: Medicare Other

## 2013-03-31 DIAGNOSIS — Z23 Encounter for immunization: Secondary | ICD-10-CM

## 2013-04-07 ENCOUNTER — Ambulatory Visit (INDEPENDENT_AMBULATORY_CARE_PROVIDER_SITE_OTHER): Payer: Medicare Other | Admitting: Internal Medicine

## 2013-04-07 ENCOUNTER — Encounter: Payer: Self-pay | Admitting: Internal Medicine

## 2013-04-07 ENCOUNTER — Ambulatory Visit (INDEPENDENT_AMBULATORY_CARE_PROVIDER_SITE_OTHER)
Admission: RE | Admit: 2013-04-07 | Discharge: 2013-04-07 | Disposition: A | Discharge status: Home / Self Care | Payer: Medicare Other | Source: Ambulatory Visit | Attending: Internal Medicine | Admitting: Internal Medicine

## 2013-04-07 VITALS — BP 102/62 | HR 67 | Temp 99.3°F | Ht 62.0 in | Wt 115.1 lb

## 2013-04-07 DIAGNOSIS — J441 Chronic obstructive pulmonary disease with (acute) exacerbation: Secondary | ICD-10-CM

## 2013-04-07 DIAGNOSIS — R609 Edema, unspecified: Secondary | ICD-10-CM

## 2013-04-07 DIAGNOSIS — F329 Major depressive disorder, single episode, unspecified: Secondary | ICD-10-CM

## 2013-04-07 DIAGNOSIS — J209 Acute bronchitis, unspecified: Secondary | ICD-10-CM

## 2013-04-07 MED ORDER — PREDNISONE 10 MG PO TABS: ORAL_TABLET | ORAL | Status: DC

## 2013-04-07 MED ORDER — LEVOFLOXACIN 250 MG PO TABS: 250.0000 mg | ORAL_TABLET | Freq: Every day | ORAL | Status: DC

## 2013-04-07 MED ORDER — HYDROCODONE-HOMATROPINE 5-1.5 MG/5ML PO SYRP: 5.0000 mL | ORAL_SOLUTION | Freq: Four times a day (QID) | ORAL | Status: DC | PRN

## 2013-04-07 MED ORDER — METHYLPREDNISOLONE ACETATE 80 MG/ML IJ SUSP
120.0000 mg | Freq: Once | INTRAMUSCULAR | Status: AC
  Administered 2013-04-07: 120 mg via INTRAMUSCULAR

## 2013-04-07 NOTE — Assessment & Plan Note (Signed)
stable overall by history and exam, recent data reviewed with pt, and pt to continue medical treatment as before,  to f/u any worsening symptoms or concerns Lab Results  Component Value Date   WBC 5.4 12/22/2012   HGB 16.1* 12/22/2012   HCT 47.5* 12/22/2012   PLT 195.0 12/22/2012   GLUCOSE 81 12/22/2012   CHOL 179 08/17/2012   TRIG 76.0 08/17/2012   HDL 60.60 08/17/2012   LDLDIRECT 196.0 08/15/2010   LDLCALC 103* 08/17/2012   ALT 14 12/22/2012   AST 20 12/22/2012   NA 140 12/22/2012   K 4.0 12/22/2012   CL 101 12/22/2012   CREATININE 0.6 12/22/2012   BUN 16 12/22/2012   CO2 33* 12/22/2012   TSH 0.84 12/22/2012   HGBA1C 5.6 08/01/2011

## 2013-04-07 NOTE — Assessment & Plan Note (Signed)
stable overall by history and exam, and pt to continue medical treatment as before,  to f/u any worsening symptoms or concerns 

## 2013-04-07 NOTE — Assessment & Plan Note (Signed)
Mild to mod, for antibx course,  to f/u any worsening symptoms or concerns, also for cxr - r/o pna 

## 2013-04-07 NOTE — Patient Instructions (Addendum)
You had the steroid shot today Please take all new medication as prescribed - the antibiotic, cough medicine, prednisone Please continue all other medications as before, and refills have been done if requested. Please have the pharmacy call with any other refills you may need.  Please go to the XRAY Department in the Basement (go straight as you get off the elevator) for the x-ray testing  You will be contacted by phone if any changes need to be made immediately.  Otherwise, you will receive a letter about your results with an explanation, but please check with MyChart first.  Please remember to sign up for My Chart if you have not done so, as this will be important to you in the future with finding out test results, communicating by private email, and scheduling acute appointments online when needed.

## 2013-04-07 NOTE — Assessment & Plan Note (Signed)
Mod, for depomedrol IM, predpack asd , cough med,  to f/u any worsening symptoms or concerns

## 2013-04-07 NOTE — Progress Notes (Signed)
Subjective:    Patient ID: Michele Martin, female    DOB: 19-Oct-1945, 67 y.o.   MRN: [id number]  HPI  Here with acute onset mild to mod 2-3 days ST, HA, wheezing, sob/doe general weakness and malaise, with minimally prod cough greenish sputum, but Pt denies chest pain, orthopnea, PND, increased LE swelling, palpitations, dizziness or syncope. Has done well, with last episode approx 1 yr ago.  Just [place] from [place], and lungs pretty clear there.  Not sure if any of them sick, such as viral or other.   Pt denies polydipsia, polyuria.  Still smokes occasional cigarrette.  Denies worsening depressive symptoms, suicidal ideation, or panic; Past Medical History  Diagnosis Date  . HYPERLIPIDEMIA 09/19/2009  . ANXIETY 04/15/2010  . DEPRESSION 09/19/2009  . Acute angle-closure glaucoma 02/14/2010  . ALLERGIC RHINITIS 09/19/2009  . CHRONIC OBSTRUCTIVE PULMONARY DISEASE, ACUTE EXACERBATION 12/19/2009  . ASTHMA 09/19/2009  . COPD 06/27/2009  . BACK PAIN 02/14/2010  . TREMOR 02/14/2010  . HOARSENESS 10/17/2009  . Wheezing 06/27/2009  . MENOPAUSAL DISORDER 08/20/2010  . TINNITUS 09/10/2010  . URI 09/10/2010  . OSTEOPENIA 09/10/2010  . Chronic bronchitis 04/14/2011  . Rotator cuff tear 06/23/2011  . Tremor 07/30/2011  . Arthritis   . Complication of anesthesia    Past Surgical History  Procedure Laterality Date  . Normal heart cath  2001    Dr. Eden Emms  . Abdominal hysterectomy    . S/p laser tx for glaucoma      no vision loss  . Cervical fusion 2013 - dr Venetia Maxon      reports that she quit smoking about 19 months ago. Her smoking use included Cigarettes. She has a 40 pack-year smoking history. She has never used smokeless tobacco. She reports that she does not drink alcohol or use illicit drugs. family history includes Dementia in her mother. There is no history of Colon cancer. Allergies  Allergen Reactions  . Pravastatin     REACTION: itch and leg cramp  . Simvastatin    REACTION: itch and leg cramps  . Statins    Current Outpatient Prescriptions on File Prior to Visit  Medication Sig Dispense Refill  . aspirin 81 MG EC tablet Take 81 mg by mouth daily.        [name] atorvastatin (LIPITOR) 10 MG tablet Take 1 tablet (10 mg total) by mouth daily.  90 tablet  3  . budesonide-formoterol (SYMBICORT) 160-4.5 MCG/ACT inhaler Inhale 2 puffs into the lungs 2 (two) times daily.  1 Inhaler  10  . clonazePAM (KLONOPIN) 0.5 MG tablet Take 1 tablet (0.5 mg total) by mouth 2 (two) times daily as needed for anxiety.  60 tablet  2  . escitalopram (LEXAPRO) 10 MG tablet Take 1 tablet (10 mg total) by mouth daily.  30 tablet  11  . escitalopram (LEXAPRO) 10 MG tablet Take 1 tablet (10 mg total) by mouth daily.  30 tablet  11  . fexofenadine (ALLEGRA) 180 MG tablet Take 180 mg by mouth daily.        . montelukast (SINGULAIR) 10 MG tablet Take 1 tablet (10 mg total) by mouth at bedtime.  30 tablet  11  . omeprazole (PRILOSEC) 20 MG capsule Take 1 capsule (20 mg total) by mouth daily.  30 capsule  3  . ondansetron (ZOFRAN) 8 MG tablet 1/2 - 1 tab by mouth every 8 hrs as needed for nausea  40 tablet  1  . tiotropium (SPIRIVA  HANDIHALER) 18 MCG inhalation capsule Place 1 capsule (18 mcg total) into inhaler and inhale daily.  30 capsule  10  . tiZANidine (ZANAFLEX) 4 MG tablet Take 1 tablet (4 mg total) by mouth every 6 (six) hours as needed.  60 tablet  2  . traMADol (ULTRAM) 50 MG tablet Take 0.5-1 tablets (25-50 mg total) by mouth every 6 (six) hours as needed. For pain  60 tablet  2   No current facility-administered medications on file prior to visit.   Review of Systems  Constitutional: Negative for unexpected weight change, or unusual diaphoresis  HENT: Negative for tinnitus.   Eyes: Negative for photophobia and visual disturbance.  Respiratory: Negative for choking and stridor.   Gastrointestinal: Negative for vomiting and blood in stool.  Genitourinary: Negative for hematuria  and decreased urine volume.  Musculoskeletal: Negative for acute joint swelling Skin: Negative for color change and wound.  Neurological: Negative for tremors and numbness other than noted  Psychiatric/Behavioral: Negative for decreased concentration or  hyperactivity.       Objective:   Physical Exam BP 102/62  Pulse 67  Temp(Src) 99.3 F (37.4 C) (Oral)  Ht 5\' 2"  (1.575 m)  Wt 115 lb 2 oz (52.22 kg)  BMI 21.05 kg/m2  SpO2 95% VS noted, mild ill Constitutional: Pt appears well-developed and well-nourished.  HENT: Head: NCAT.  Right Ear: External ear normal.  Left Ear: External ear normal.  Bilat tm's with mild erythema.  Max sinus areas non tender.  Pharynx with mild erythema, no exudate Eyes: Conjunctivae and EOM are normal. Pupils are equal, round, and reactive to light.  Neck: Normal range of motion. Neck supple.  Cardiovascular: Normal rate and regular rhythm.   Pulmonary/Chest: Effort normal and breath sounds decreased with bilat wheeze insp/exp.  Neurological: Pt is alert. Not confused  Skin: Skin is warm. No erythema. No LE edema Psychiatric: Pt behavior is normal. Thought content normal. , not depressed affect    Assessment & Plan:

## 2013-04-13 ENCOUNTER — Telehealth: Payer: Self-pay | Admitting: Internal Medicine

## 2013-04-13 DIAGNOSIS — J449 Chronic obstructive pulmonary disease, unspecified: Secondary | ICD-10-CM

## 2013-04-13 NOTE — Telephone Encounter (Signed)
Patient informed of referral

## 2013-04-13 NOTE — Telephone Encounter (Signed)
Robin to notify pt -, needs post bronchodilator PFT's to qualify for pulm rehab - I have ordered

## 2013-04-20 ENCOUNTER — Ambulatory Visit (INDEPENDENT_AMBULATORY_CARE_PROVIDER_SITE_OTHER): Payer: Medicare Other | Admitting: Internal Medicine

## 2013-04-20 DIAGNOSIS — J449 Chronic obstructive pulmonary disease, unspecified: Secondary | ICD-10-CM

## 2013-04-20 NOTE — Progress Notes (Signed)
PFT done today. 

## 2013-04-21 ENCOUNTER — Ambulatory Visit (INDEPENDENT_AMBULATORY_CARE_PROVIDER_SITE_OTHER): Payer: Medicare Other | Admitting: Internal Medicine

## 2013-04-21 ENCOUNTER — Encounter: Payer: Self-pay | Admitting: Internal Medicine

## 2013-04-21 VITALS — BP 100/60 | HR 85 | Temp 97.2°F | Ht 61.0 in | Wt 117.5 lb

## 2013-04-21 DIAGNOSIS — J441 Chronic obstructive pulmonary disease with (acute) exacerbation: Secondary | ICD-10-CM

## 2013-04-21 DIAGNOSIS — R079 Chest pain, unspecified: Secondary | ICD-10-CM | POA: Insufficient documentation

## 2013-04-21 DIAGNOSIS — J209 Acute bronchitis, unspecified: Secondary | ICD-10-CM

## 2013-04-21 MED ORDER — PREDNISONE 10 MG PO TABS: ORAL_TABLET | ORAL | Status: DC

## 2013-04-21 NOTE — Patient Instructions (Signed)
Please take all new medication as prescribed  - the prednisone today Please continue all other medications as before, including starting the Tramadol as you have at home, and this helps with cough as well Please have the pharmacy call with any other refills you may need.  Please remember to sign up for My Chart if you have not done so, as this will be important to you in the future with finding out test results, communicating by private email, and scheduling acute appointments online when needed.

## 2013-04-21 NOTE — Progress Notes (Signed)
Subjective:    Patient ID: Michele Martin, female    DOB: 1945/11/15, 67 y.o.   MRN: [id number]  HPI  Here to f/u having finished most of her meds, not feeling as much improved this time as has in the past, and has diffuse sharp pleuritic CP anteriorly and bilat costal margins, still some SOB and tight, though overall improved.  Has persistent cough now less productive, no worsening fever, chills, sob.  Walks in from parking lot today with difficulty or having to stop to rest, thinks she walk 100 yds at least.  Main complaint is the pain that is more than previous.  Recent cxr neg for acute.   Past Medical History  Diagnosis Date  . HYPERLIPIDEMIA 09/19/2009  . ANXIETY 04/15/2010  . DEPRESSION 09/19/2009  . Acute angle-closure glaucoma 02/14/2010  . ALLERGIC RHINITIS 09/19/2009  . CHRONIC OBSTRUCTIVE PULMONARY DISEASE, ACUTE EXACERBATION 12/19/2009  . ASTHMA 09/19/2009  . COPD 06/27/2009  . BACK PAIN 02/14/2010  . TREMOR 02/14/2010  . HOARSENESS 10/17/2009  . Wheezing 06/27/2009  . MENOPAUSAL DISORDER 08/20/2010  . TINNITUS 09/10/2010  . URI 09/10/2010  . OSTEOPENIA 09/10/2010  . Chronic bronchitis 04/14/2011  . Rotator cuff tear 06/23/2011  . Tremor 07/30/2011  . Arthritis   . Complication of anesthesia    Past Surgical History  Procedure Laterality Date  . Normal heart cath  2001    Dr. Eden Emms  . Abdominal hysterectomy    . S/p laser tx for glaucoma      no vision loss  . Cervical fusion 2013 - dr Venetia Maxon      reports that she quit smoking about 19 months ago. Her smoking use included Cigarettes. She has a 40 pack-year smoking history. She has never used smokeless tobacco. She reports that she does not drink alcohol or use illicit drugs. family history includes Dementia in her mother. There is no history of Colon cancer. Allergies  Allergen Reactions  . Pravastatin     REACTION: itch and leg cramp  . Simvastatin     REACTION: itch and leg cramps  . Statins    Current Outpatient  Prescriptions on File Prior to Visit  Medication Sig Dispense Refill  . aspirin 81 MG EC tablet Take 81 mg by mouth daily.        [name] atorvastatin (LIPITOR) 10 MG tablet Take 1 tablet (10 mg total) by mouth daily.  90 tablet  3  . budesonide-formoterol (SYMBICORT) 160-4.5 MCG/ACT inhaler Inhale 2 puffs into the lungs 2 (two) times daily.  1 Inhaler  10  . clonazePAM (KLONOPIN) 0.5 MG tablet Take 1 tablet (0.5 mg total) by mouth 2 (two) times daily as needed for anxiety.  60 tablet  2  . escitalopram (LEXAPRO) 10 MG tablet Take 1 tablet (10 mg total) by mouth daily.  30 tablet  11  . fexofenadine (ALLEGRA) 180 MG tablet Take 180 mg by mouth daily.        [name] HYDROcodone-homatropine (HYCODAN) 5-1.5 MG/5ML syrup Take 5 mLs by mouth every 6 (six) hours as needed for cough.  120 mL  1  . levofloxacin (LEVAQUIN) 250 MG tablet Take 1 tablet (250 mg total) by mouth daily.  10 tablet  0  . montelukast (SINGULAIR) 10 MG tablet Take 1 tablet (10 mg total) by mouth at bedtime.  30 tablet  11  . omeprazole (PRILOSEC) 20 MG capsule Take 1 capsule (20 mg total) by mouth daily.  30 capsule  3  . ondansetron (  ZOFRAN) 8 MG tablet 1/2 - 1 tab by mouth every 8 hrs as needed for nausea  40 tablet  1  . tiotropium (SPIRIVA HANDIHALER) 18 MCG inhalation capsule Place 1 capsule (18 mcg total) into inhaler and inhale daily.  30 capsule  10  . tiZANidine (ZANAFLEX) 4 MG tablet Take 1 tablet (4 mg total) by mouth every 6 (six) hours as needed.  60 tablet  2  . traMADol (ULTRAM) 50 MG tablet Take 0.5-1 tablets (25-50 mg total) by mouth every 6 (six) hours as needed. For pain  60 tablet  2   No current facility-administered medications on file prior to visit.   Review of Systems  Constitutional: Negative for unexpected weight change, or unusual diaphoresis  HENT: Negative for tinnitus.   Eyes: Negative for photophobia and visual disturbance.  Respiratory: Negative for choking and stridor.   Gastrointestinal: Negative for  vomiting and blood in stool.  Genitourinary: Negative for hematuria and decreased urine volume.  Musculoskeletal: Negative for acute joint swelling Skin: Negative for color change and wound.  Neurological: Negative for tremors and numbness other than noted  Psychiatric/Behavioral: Negative for decreased concentration or  hyperactivity.       Objective:   Physical Exam BP 100/60  Pulse 85  Temp(Src) 97.2 F (36.2 C) (Oral)  Ht 5\' 1"  (1.549 m)  Wt 117 lb 8 oz (53.298 kg)  BMI 22.21 kg/m2  SpO2 95% VS noted, less ill today, more stamina, less fatigued Constitutional: Pt appears well-developed and well-nourished.  HENT: Head: NCAT.  Right Ear: External ear normal.  Left Ear: External ear normal.  Eyes: Conjunctivae and EOM are normal. Pupils are equal, round, and reactive to light.  Neck: Normal range of motion. Neck supple.  Cardiovascular: Normal rate and regular rhythm.   Pulmonary/Chest: Effort normal and breath sounds decreased with few wheeze only, no rales or rhonchi  Neurological: Pt is alert. Not confused  Skin: Skin is warm. No erythema.  Tender diffuse ant chest tender and bilat costal margins Psychiatric: Pt behavior is normal. Thought content normal.     Assessment & Plan:

## 2013-04-21 NOTE — Assessment & Plan Note (Signed)
C/w msk /chest wall pain, for tramadol prn, to f/u any worsening symptoms or concerns, should help with cough as well

## 2013-04-21 NOTE — Assessment & Plan Note (Signed)
Improved, but still with mild wheezing, for repeat predpack

## 2013-04-21 NOTE — Assessment & Plan Note (Signed)
Improved, to finish the antibx as prescribed, cont cough med

## 2013-05-03 ENCOUNTER — Other Ambulatory Visit: Payer: Self-pay | Admitting: Neurosurgery

## 2013-05-03 DIAGNOSIS — D320 Benign neoplasm of cerebral meninges: Secondary | ICD-10-CM

## 2013-05-11 ENCOUNTER — Ambulatory Visit
Admission: RE | Admit: 2013-05-11 | Discharge: 2013-05-11 | Disposition: A | Discharge status: Home / Self Care | Payer: Medicare Other | Source: Ambulatory Visit | Attending: Neurosurgery | Admitting: Neurosurgery

## 2013-05-11 DIAGNOSIS — D320 Benign neoplasm of cerebral meninges: Secondary | ICD-10-CM

## 2013-05-11 MED ORDER — GADOBENATE DIMEGLUMINE 529 MG/ML IV SOLN
10.0000 mL | Freq: Once | INTRAVENOUS | Status: AC | PRN
  Administered 2013-05-11: 10 mL via INTRAVENOUS

## 2013-05-18 ENCOUNTER — Telehealth (HOSPITAL_COMMUNITY): Payer: Self-pay | Admitting: *Deleted

## 2013-05-18 NOTE — Telephone Encounter (Signed)
Call placed to patient regarding referral to Pulmonary Rehab.  Message left requesting return call.

## 2013-07-19 ENCOUNTER — Ambulatory Visit (INDEPENDENT_AMBULATORY_CARE_PROVIDER_SITE_OTHER): Payer: Medicare Other | Admitting: Internal Medicine

## 2013-07-19 ENCOUNTER — Encounter: Payer: Self-pay | Admitting: Internal Medicine

## 2013-07-19 ENCOUNTER — Other Ambulatory Visit (INDEPENDENT_AMBULATORY_CARE_PROVIDER_SITE_OTHER): Payer: Medicare Other

## 2013-07-19 ENCOUNTER — Other Ambulatory Visit: Payer: Self-pay | Admitting: Internal Medicine

## 2013-07-19 VITALS — BP 110/80 | HR 82 | Temp 97.0°F | Ht 62.0 in | Wt 110.2 lb

## 2013-07-19 DIAGNOSIS — Z0000 Encounter for general adult medical examination without abnormal findings: Secondary | ICD-10-CM

## 2013-07-19 DIAGNOSIS — F411 Generalized anxiety disorder: Secondary | ICD-10-CM

## 2013-07-19 DIAGNOSIS — Z23 Encounter for immunization: Secondary | ICD-10-CM

## 2013-07-19 DIAGNOSIS — E785 Hyperlipidemia, unspecified: Secondary | ICD-10-CM

## 2013-07-19 LAB — BASIC METABOLIC PANEL
CO2: 31 mEq/L (ref 19–32)
Calcium: 9.3 mg/dL (ref 8.4–10.5)
Chloride: 101 mEq/L (ref 96–112)
Glucose, Bld: 85 mg/dL (ref 70–99)
Potassium: 4 mEq/L (ref 3.5–5.1)
Sodium: 140 mEq/L (ref 135–145)

## 2013-07-19 LAB — CBC WITH DIFFERENTIAL/PLATELET
Basophils Absolute: 0 10*3/uL (ref 0.0–0.1)
Eosinophils Absolute: 0.2 10*3/uL (ref 0.0–0.7)
Hemoglobin: 14.9 g/dL (ref 12.0–15.0)
Lymphocytes Relative: 24.8 % (ref 12.0–46.0)
Monocytes Relative: 6.2 % (ref 3.0–12.0)
Neutro Abs: 4.2 10*3/uL (ref 1.4–7.7)
Neutrophils Relative %: 65 % (ref 43.0–77.0)
Platelets: 259 10*3/uL (ref 150.0–400.0)
RDW: 12.9 % (ref 11.5–14.6)

## 2013-07-19 LAB — HEPATIC FUNCTION PANEL
ALT: 13 U/L (ref 0–35)
AST: 19 U/L (ref 0–37)
Albumin: 3.7 g/dL (ref 3.5–5.2)
Alkaline Phosphatase: 63 U/L (ref 39–117)
Total Protein: 6.7 g/dL (ref 6.0–8.3)

## 2013-07-19 LAB — URINALYSIS, ROUTINE W REFLEX MICROSCOPIC
Hgb urine dipstick: NEGATIVE
Leukocytes, UA: NEGATIVE
RBC / HPF: NONE SEEN (ref 0–?)
Specific Gravity, Urine: 1.025 (ref 1.000–1.030)
Urine Glucose: NEGATIVE
Urobilinogen, UA: 0.2 (ref 0.0–1.0)
WBC, UA: NONE SEEN (ref 0–?)
pH: 5.5 (ref 5.0–8.0)

## 2013-07-19 LAB — LIPID PANEL
Cholesterol: 225 mg/dL — ABNORMAL HIGH (ref 0–200)
Triglycerides: 88 mg/dL (ref 0.0–149.0)
VLDL: 17.6 mg/dL (ref 0.0–40.0)

## 2013-07-19 LAB — TSH: TSH: 0.91 u[IU]/mL (ref 0.35–5.50)

## 2013-07-19 MED ORDER — ATORVASTATIN CALCIUM 20 MG PO TABS: 20.0000 mg | ORAL_TABLET | Freq: Every day | ORAL | Status: DC

## 2013-07-19 MED ORDER — ESCITALOPRAM OXALATE 20 MG PO TABS: 20.0000 mg | ORAL_TABLET | Freq: Every day | ORAL | Status: DC

## 2013-07-19 NOTE — Progress Notes (Signed)
Pre-visit discussion using our clinic review tool. No additional management support is needed unless otherwise documented below in the visit note.  

## 2013-07-19 NOTE — Assessment & Plan Note (Signed)

## 2013-07-19 NOTE — Assessment & Plan Note (Signed)
Ok for increased lexapro asd, verified no SI or HI

## 2013-07-19 NOTE — Patient Instructions (Addendum)
You had the new Prevnar pneumonia shot today Please take all new medication as prescribed - the increased dose lexapro Please call if you have more "sluggishness" on the increased lexapro in the next few wks, as this can be due to the medication Please call for your yearly mammogram Please continue all other medications as before, and refills have been done if requested. Please have the pharmacy call with any other refills you may need. Please continue your efforts at being more active, low cholesterol diet, and weight control. You are otherwise up to date with prevention measures today. Please keep your appointments with your specialists as you have planned- Dr Venetia Maxon  Please go to the LAB in the Basement (turn left off the elevator) for the tests to be done today You will be contacted by phone if any changes need to be made immediately.  Otherwise, you will receive a letter about your results with an explanation, but please check with MyChart first.  Please remember to sign up for My Chart if you have not done so, as this will be important to you in the future with finding out test results, communicating by private email, and scheduling acute appointments online when needed.  Please return in 6 months, or sooner if needed

## 2013-07-19 NOTE — Progress Notes (Signed)
Subjective:    Patient ID: Michele Martin, female    DOB: January 08, 1946, 67 y.o.   MRN: [id number]  HPI  Here for wellness and f/u;  Overall doing ok;  Pt denies CP, worsening SOB, DOE, wheezing, orthopnea, PND, worsening LE edema, palpitations, dizziness or syncope.  Pt denies neurological change such as new headache, facial or extremity weakness.  Pt denies polydipsia, polyuria, or low sugar symptoms. Pt states overall good compliance with treatment and medications, good tolerability, and has been trying to follow lower cholesterol diet.  Pt denies worsening depressive symptoms, suicidal ideation or panic. No fever, night sweats, wt loss, loss of appetite, or other constitutional symptoms.  Pt states good ability with ADL's, has low fall risk, home safety reviewed and adequate, no other significant changes in hearing or vision, and only occasionally active with exercise.  Does have ongoing f/u with Dr Stern/NS regarding brain mass; did increase size somewhat with most recent MRI, plan is for f/u at one more yr, if increased again will likely indicate need for surgury. Pt asks for increased lexapro due to stress over this. Past Medical History  Diagnosis Date  . HYPERLIPIDEMIA 09/19/2009  . ANXIETY 04/15/2010  . DEPRESSION 09/19/2009  . Acute angle-closure glaucoma 02/14/2010  . ALLERGIC RHINITIS 09/19/2009  . CHRONIC OBSTRUCTIVE PULMONARY DISEASE, ACUTE EXACERBATION 12/19/2009  . ASTHMA 09/19/2009  . COPD 06/27/2009  . BACK PAIN 02/14/2010  . TREMOR 02/14/2010  . HOARSENESS 10/17/2009  . Wheezing 06/27/2009  . MENOPAUSAL DISORDER 08/20/2010  . TINNITUS 09/10/2010  . URI 09/10/2010  . OSTEOPENIA 09/10/2010  . Chronic bronchitis 04/14/2011  . Rotator cuff tear 06/23/2011  . Tremor 07/30/2011  . Arthritis   . Complication of anesthesia    Past Surgical History  Procedure Laterality Date  . Normal heart cath  2001    Dr. Eden Emms  . Abdominal hysterectomy    . S/p laser tx for glaucoma      no vision  loss  . Cervical fusion 2013 - dr Venetia Maxon      reports that she quit smoking about 22 months ago. Her smoking use included Cigarettes. She has a 40 pack-year smoking history. She has never used smokeless tobacco. She reports that she does not drink alcohol or use illicit drugs. family history includes Dementia in her mother. There is no history of Colon cancer. Allergies  Allergen Reactions  . Pravastatin     REACTION: itch and leg cramp  . Simvastatin     REACTION: itch and leg cramps  . Statins    Current Outpatient Prescriptions on File Prior to Visit  Medication Sig Dispense Refill  . aspirin 81 MG EC tablet Take 81 mg by mouth daily.        [name] atorvastatin (LIPITOR) 10 MG tablet Take 1 tablet (10 mg total) by mouth daily.  90 tablet  3  . budesonide-formoterol (SYMBICORT) 160-4.5 MCG/ACT inhaler Inhale 2 puffs into the lungs 2 (two) times daily.  1 Inhaler  10  . clonazePAM (KLONOPIN) 0.5 MG tablet Take 1 tablet (0.5 mg total) by mouth 2 (two) times daily as needed for anxiety.  60 tablet  2  . escitalopram (LEXAPRO) 10 MG tablet Take 1 tablet (10 mg total) by mouth daily.  30 tablet  11  . fexofenadine (ALLEGRA) 180 MG tablet Take 180 mg by mouth daily.        . montelukast (SINGULAIR) 10 MG tablet Take 1 tablet (10 mg total) by mouth at bedtime.  30 tablet  11  . tiotropium (SPIRIVA HANDIHALER) 18 MCG inhalation capsule Place 1 capsule (18 mcg total) into inhaler and inhale daily.  30 capsule  10  . traMADol (ULTRAM) 50 MG tablet Take 0.5-1 tablets (25-50 mg total) by mouth every 6 (six) hours as needed. For pain  60 tablet  2   No current facility-administered medications on file prior to visit.    Review of Systems Constitutional: Negative for diaphoresis, activity change, appetite change or unexpected weight change.  HENT: Negative for hearing loss, ear pain, facial swelling, mouth sores and neck stiffness.   Eyes: Negative for pain, redness and visual disturbance.    Respiratory: Negative for shortness of breath and wheezing.   Cardiovascular: Negative for chest pain and palpitations.  Gastrointestinal: Negative for diarrhea, blood in stool, abdominal distention or other pain Genitourinary: Negative for hematuria, flank pain or change in urine volume.  Musculoskeletal: Negative for myalgias and joint swelling.  Skin: Negative for color change and wound.  Neurological: Negative for syncope and numbness. other than noted Hematological: Negative for adenopathy.  Psychiatric/Behavioral: Negative for hallucinations, self-injury, decreased concentration and agitation.      Objective:   Physical Exam BP 110/80  Pulse 82  Temp(Src) 97 F (36.1 C) (Oral)  Ht 5\' 2"  (1.575 m)  Wt 110 lb 4 oz (50.009 kg)  BMI 20.16 kg/m2  SpO2 91% VS noted,  Constitutional: Pt is oriented to person, place, and time. Appears well-developed and well-nourished.  Head: Normocephalic and atraumatic.  Right Ear: External ear normal.  Left Ear: External ear normal.  Nose: Nose normal.  Mouth/Throat: Oropharynx is clear and moist.  Eyes: Conjunctivae and EOM are normal. Pupils are equal, round, and reactive to light.  Neck: Normal range of motion. Neck supple. No JVD present. No tracheal deviation present.  Cardiovascular: Normal rate, regular rhythm, normal heart sounds and intact distal pulses.   Pulmonary/Chest: Effort normal and breath sounds normal.  Abdominal: Soft. Bowel sounds are normal. There is no tenderness. No HSM  Musculoskeletal: Normal range of motion. Exhibits no edema.  Lymphadenopathy:  Has no cervical adenopathy.  Neurological: Pt is alert and oriented to person, place, and time. Pt has normal reflexes. No cranial nerve deficit.  Skin: Skin is warm and dry. No rash noted.  Psychiatric:  Has  1+ nervous mood and affect. Behavior is normal.     Assessment & Plan:

## 2013-07-19 NOTE — Addendum Note (Signed)
Addended by: Scharlene Gloss B on: 07/19/2013 11:08 AM   Modules accepted: Orders

## 2013-07-29 ENCOUNTER — Other Ambulatory Visit: Payer: Self-pay | Admitting: Internal Medicine

## 2013-07-29 NOTE — Telephone Encounter (Signed)
Hard copy faxed. 

## 2013-08-05 ENCOUNTER — Other Ambulatory Visit: Payer: Self-pay | Admitting: *Deleted

## 2013-08-05 MED ORDER — TRAMADOL HCL 50 MG PO TABS: 25.0000 mg | ORAL_TABLET | Freq: Four times a day (QID) | ORAL | Status: DC | PRN

## 2013-08-05 NOTE — Telephone Encounter (Signed)
Done hardcopy to robin  

## 2013-08-06 NOTE — Telephone Encounter (Signed)
Faxed script to [facility]...Michele Martin

## 2013-08-12 ENCOUNTER — Other Ambulatory Visit: Payer: Self-pay | Admitting: Internal Medicine

## 2013-08-17 ENCOUNTER — Other Ambulatory Visit: Payer: Self-pay | Admitting: Internal Medicine

## 2013-08-17 DIAGNOSIS — J441 Chronic obstructive pulmonary disease with (acute) exacerbation: Secondary | ICD-10-CM

## 2013-08-17 MED ORDER — TIOTROPIUM BROMIDE MONOHYDRATE 18 MCG IN CAPS: 18.0000 ug | ORAL_CAPSULE | Freq: Every day | RESPIRATORY_TRACT | Status: DC

## 2013-08-17 NOTE — Telephone Encounter (Signed)
Done for pt assist program

## 2013-08-24 ENCOUNTER — Ambulatory Visit: Payer: Medicare Other | Admitting: Internal Medicine

## 2013-08-25 ENCOUNTER — Encounter: Payer: Self-pay | Admitting: Internal Medicine

## 2013-08-25 ENCOUNTER — Ambulatory Visit (INDEPENDENT_AMBULATORY_CARE_PROVIDER_SITE_OTHER): Payer: Medicare Other | Admitting: Internal Medicine

## 2013-08-25 VITALS — BP 130/78 | HR 77 | Temp 97.0°F | Wt 118.0 lb

## 2013-08-25 DIAGNOSIS — M545 Low back pain, unspecified: Secondary | ICD-10-CM

## 2013-08-25 DIAGNOSIS — J441 Chronic obstructive pulmonary disease with (acute) exacerbation: Secondary | ICD-10-CM

## 2013-08-25 DIAGNOSIS — J209 Acute bronchitis, unspecified: Secondary | ICD-10-CM

## 2013-08-25 MED ORDER — HYDROCODONE-ACETAMINOPHEN 7.5-325 MG PO TABS: 1.0000 | ORAL_TABLET | Freq: Four times a day (QID) | ORAL | Status: DC | PRN

## 2013-08-25 MED ORDER — PREDNISONE 10 MG PO TABS: ORAL_TABLET | ORAL | Status: DC

## 2013-08-25 MED ORDER — LEVOFLOXACIN 250 MG PO TABS: 250.0000 mg | ORAL_TABLET | Freq: Every day | ORAL | Status: DC

## 2013-08-25 MED ORDER — CYCLOBENZAPRINE HCL 5 MG PO TABS: 5.0000 mg | ORAL_TABLET | Freq: Three times a day (TID) | ORAL | Status: DC | PRN

## 2013-08-25 MED ORDER — TIOTROPIUM BROMIDE MONOHYDRATE 18 MCG IN CAPS: 18.0000 ug | ORAL_CAPSULE | Freq: Every day | RESPIRATORY_TRACT | Status: DC

## 2013-08-25 MED ORDER — HYDROCODONE-HOMATROPINE 5-1.5 MG/5ML PO SYRP: 5.0000 mL | ORAL_SOLUTION | Freq: Four times a day (QID) | ORAL | Status: DC | PRN

## 2013-08-25 NOTE — Assessment & Plan Note (Signed)
Mild to mod, for antibx course,  to f/u any worsening symptoms or concerns 

## 2013-08-25 NOTE — Assessment & Plan Note (Signed)
Etiology unclear, suspect msk strain, no neuro changes, ok for pain control,  to f/u any worsening symptoms or concerns

## 2013-08-25 NOTE — Progress Notes (Signed)
Subjective:    Patient ID: Michele Martin, female    DOB: [birth year], 68 y.o.   MRN: [id number]  HPI  Here with c/o URi symtpoms now with earlyu worsening prod cough, sob and wheezing, but Pt denies chest pain,  orthopnea, PND, increased LE swelling, palpitations, dizziness or syncope. Pt denies new neurological symptoms such as new headache, or facial or extremity weakness or numbness   Pt denies polydipsia, polyuria,   Pt also with new onset 1 wk mod to severe right LBP without change in severity, bowel or bladder change, fever, wt loss,  worsening LE pain/numbness/weakness, gait change or falls. Tramadol only takes edge off. No obvious inciting event.  Denies urinary symptoms such as dysuria, frequency, urgency, flank pain, hematuria or n/v, fever, chills  Denies worsening reflux, abd pain, dysphagia, n/v, bowel change or blood. Pain worse to sit, better to standing or lying. No recent fall, trauma, heavy lifting or other fever, malignancy hx. Past Medical History  Diagnosis Date  . HYPERLIPIDEMIA 09/19/2009  . ANXIETY 04/15/2010  . DEPRESSION 09/19/2009  . Acute angle-closure glaucoma 02/14/2010  . ALLERGIC RHINITIS 09/19/2009  . CHRONIC OBSTRUCTIVE PULMONARY DISEASE, ACUTE EXACERBATION 12/19/2009  . ASTHMA 09/19/2009  . COPD 06/27/2009  . BACK PAIN 02/14/2010  . TREMOR 02/14/2010  . HOARSENESS 10/17/2009  . Wheezing 06/27/2009  . MENOPAUSAL DISORDER 08/20/2010  . TINNITUS 09/10/2010  . URI 09/10/2010  . OSTEOPENIA 09/10/2010  . Chronic bronchitis 04/14/2011  . Rotator cuff tear 06/23/2011  . Tremor 07/30/2011  . Arthritis   . Complication of anesthesia    Past Surgical History  Procedure Laterality Date  . Normal heart cath  2001    Dr. Johnsie Cancel  . Abdominal hysterectomy    . S/p laser tx for glaucoma      no vision loss  . Cervical fusion 2013 - dr Vertell Limber      reports that she quit smoking about 1 years ago. Her smoking use included Cigarettes. She has a 40 pack-year smoking history. She  has never used smokeless tobacco. She reports that she does not drink alcohol or use illicit drugs. family history includes Dementia in her mother. There is no history of Colon cancer. Allergies  Allergen Reactions  . Pravastatin     REACTION: itch and leg cramp  . Simvastatin     REACTION: itch and leg cramps  . Statins    Current Outpatient Prescriptions on File Prior to Visit  Medication Sig Dispense Refill  . aspirin 81 MG EC tablet Take 81 mg by mouth daily.        [name] atorvastatin (LIPITOR) 20 MG tablet Take 1 tablet (20 mg total) by mouth daily.  90 tablet  3  . budesonide-formoterol (SYMBICORT) 160-4.5 MCG/ACT inhaler Inhale 2 puffs into the lungs 2 (two) times daily.  1 Inhaler  10  . clonazePAM (KLONOPIN) 0.5 MG tablet TAKE ONE TABLET TWICE DAILY AS NEEDED ANXIETY  15 tablet  0  . escitalopram (LEXAPRO) 20 MG tablet Take 1 tablet (20 mg total) by mouth daily.  90 tablet  3  . fexofenadine (ALLEGRA) 180 MG tablet Take 180 mg by mouth daily.        . montelukast (SINGULAIR) 10 MG tablet Take 1 tablet (10 mg total) by mouth at bedtime.  30 tablet  11  . traMADol (ULTRAM) 50 MG tablet Take 0.5-1 tablets (25-50 mg total) by mouth every 6 (six) hours as needed. For pain  60 tablet  2  No current facility-administered medications on file prior to visit.   Review of Systems  Constitutional: Negative for unexpected weight change, or unusual diaphoresis  HENT: Negative for tinnitus.   Eyes: Negative for photophobia and visual disturbance.  Respiratory: Negative for choking and stridor.   Gastrointestinal: Negative for vomiting and blood in stool.  Genitourinary: Negative for hematuria and decreased urine volume.  Musculoskeletal: Negative for acute joint swelling Skin: Negative for color change and wound.  Neurological: Negative for tremors and numbness other than noted  Psychiatric/Behavioral: Negative for decreased concentration or  hyperactivity.       Objective:   Physical  Exam BP 130/78  Pulse 77  Temp(Src) 97 F (36.1 C) (Oral)  Wt 118 lb (53.524 kg)  SpO2 92% VS noted, mild ill Constitutional: Pt appears well-developed and well-nourished.  HENT: Head: NCAT.  Right Ear: External ear normal.  Left Ear: External ear normal.  Eyes: Conjunctivae and EOM are normal. Pupils are equal, round, and reactive to light.  Bilat tm's with mild erythema.  Max sinus areas non tender.  Pharynx with mild erythema, no exudate Neck: Normal range of motion. Neck supple.  Cardiovascular: Normal rate and regular rhythm.   Pulmonary/Chest: Effort normal and breath sounds mild decreased with few bilat wheezes.  Neurological: Pt is alert. Not confused , motor 5/5, sens/dtr intact Skin: Skin is warm. No erythema. No rash Spine: nontender, but does have mild right lumbar paravertebral tenderness Psychiatric: Pt behavior is normal. Thought content normal.     Assessment & Plan:

## 2013-08-25 NOTE — Progress Notes (Signed)
Pre-visit discussion using our clinic review tool. No additional management support is needed unless otherwise documented below in the visit note.  

## 2013-08-25 NOTE — Patient Instructions (Signed)
Please take all new medication as prescribed  - the pain medication, and muscle relaxer, as well as the antibiotic, cough medicine, and prednisone  Please continue all other medications as before, and refills have been done if requested.  Please have the pharmacy call with any other refills you may need.

## 2013-08-25 NOTE — Assessment & Plan Note (Signed)
Mild to mod, for predpack,  to f/u any worsening symptoms or concerns

## 2013-08-26 ENCOUNTER — Telehealth: Payer: Self-pay | Admitting: Internal Medicine

## 2013-08-26 NOTE — Telephone Encounter (Signed)
Relevant patient education assigned to patient using Emmi. ° °

## 2013-09-26 ENCOUNTER — Telehealth: Payer: Self-pay | Admitting: *Deleted

## 2013-09-26 DIAGNOSIS — J441 Chronic obstructive pulmonary disease with (acute) exacerbation: Secondary | ICD-10-CM

## 2013-09-26 MED ORDER — TIOTROPIUM BROMIDE MONOHYDRATE 18 MCG IN CAPS: 18.0000 ug | ORAL_CAPSULE | Freq: Every day | RESPIRATORY_TRACT | Status: DC

## 2013-09-26 NOTE — Telephone Encounter (Signed)
Somehow hard copy was lost, script needs to be re-faxed to [phone]

## 2013-09-28 ENCOUNTER — Other Ambulatory Visit: Payer: Self-pay

## 2013-09-28 MED ORDER — MONTELUKAST SODIUM 10 MG PO TABS: 10.0000 mg | ORAL_TABLET | Freq: Every day | ORAL | Status: DC

## 2013-10-26 ENCOUNTER — Ambulatory Visit (INDEPENDENT_AMBULATORY_CARE_PROVIDER_SITE_OTHER): Payer: Medicare Other | Admitting: Internal Medicine

## 2013-10-26 ENCOUNTER — Encounter: Payer: Self-pay | Admitting: Internal Medicine

## 2013-10-26 VITALS — BP 130/74 | HR 78 | Temp 97.1°F | Resp 16 | Wt 116.1 lb

## 2013-10-26 DIAGNOSIS — J441 Chronic obstructive pulmonary disease with (acute) exacerbation: Secondary | ICD-10-CM

## 2013-10-26 DIAGNOSIS — F17200 Nicotine dependence, unspecified, uncomplicated: Secondary | ICD-10-CM

## 2013-10-26 DIAGNOSIS — J449 Chronic obstructive pulmonary disease, unspecified: Secondary | ICD-10-CM

## 2013-10-26 MED ORDER — MINOCYCLINE HCL 100 MG PO CAPS: 100.0000 mg | ORAL_CAPSULE | Freq: Two times a day (BID) | ORAL | Status: DC

## 2013-10-26 MED ORDER — PREDNISONE 20 MG PO TABS: 20.0000 mg | ORAL_TABLET | Freq: Two times a day (BID) | ORAL | Status: DC

## 2013-10-26 NOTE — Progress Notes (Signed)
Pre visit review using our clinic review tool, if applicable. No additional management support is needed unless otherwise documented below in the visit note. 

## 2013-10-26 NOTE — Patient Instructions (Signed)
Please think about quitting smoking. Review the risks we discussed. Please call 1-800-QUIT-NOW ([phone] 120-6021) for free smoking cessation counseling. There are multiple options are to help you stop smoking. These include nicotine patches, nicotine gum, and the new "E cigarette".  [name]

## 2013-10-26 NOTE — Assessment & Plan Note (Signed)
See order(s).

## 2013-10-26 NOTE — Assessment & Plan Note (Signed)
Risks discussed in reference to potential dysrrhythmias, angina or MI

## 2013-10-26 NOTE — Progress Notes (Signed)
   Subjective:    Patient ID: Michele Martin, female    DOB: [birth year], 68 y.o.   MRN: [id number]  HPI  Her symptoms began last week as chest congestion with cough productive of white phlegm  She does have a past history of COPD and has been seen in the pulmonary clinic in the past.  Triggers for cough and respiratory compromise include pollen, dust, chemicals, fumes, and secondhand smoke.No identified trigger this time.  Her present symptoms have been associated with some itchy, watery eyes. She's had some chills and sweats. There has been shortness of breath with the onset of the cough  She states that she does smoke on occasion when "stressed".  She is not on ACE inhibitor.   Reflux not significant.    Review of Systems She is not having demonstrable fever, pleurisy, discolored nasal secretions, frontal or maxillary sinus pain, or wheezing.      Objective:   Physical Exam General appearance:somewhat chronically ill appearing but adequately nourished; no acute distress or increased work of breathing is present but using neck muscles to breath.  No  lymphadenopathy about the head, neck, or axilla noted.   Eyes: No conjunctival inflammation or lid edema is present. There is no scleral icterus.  Ears:  External ear exam shows no significant lesions or deformities.  Otoscopic examination reveals clear canals, tympanic membranes are intact bilaterally without bulging, retraction, inflammation or discharge.  Nose:  External nasal examination shows no deformity or inflammation. Nasal mucosa aredry without lesions or exudates. No septal dislocation or deviation.No obstruction to airflow.   Oral exam: Dentures; lips and gums are healthy appearing.There is no oropharyngeal erythema or exudate noted.   Neck:  No deformities, masses, or tenderness noted.   Taunt neck muscles Heart:  Normal rate and regular rhythm. S1 and S2 normal without gallop, murmur, click, rub or other extra  sounds.   Lungs:Chest quiet to auscultation;but harsh expiratory diffusely..    Extremities:  No cyanosis or  Edema. Slight clubbing  noted    Skin: Warm & dry w/o jaundice or tenting.         Assessment & Plan:  See Current Assessment & Plan in Problem List under specific Diagnosis

## 2013-11-10 ENCOUNTER — Ambulatory Visit (INDEPENDENT_AMBULATORY_CARE_PROVIDER_SITE_OTHER)
Admission: RE | Admit: 2013-11-10 | Discharge: 2013-11-10 | Disposition: A | Discharge status: Home / Self Care | Payer: Medicare Other | Source: Ambulatory Visit | Attending: Internal Medicine | Admitting: Internal Medicine

## 2013-11-10 ENCOUNTER — Encounter: Payer: Self-pay | Admitting: Internal Medicine

## 2013-11-10 ENCOUNTER — Ambulatory Visit (INDEPENDENT_AMBULATORY_CARE_PROVIDER_SITE_OTHER): Payer: Medicare Other | Admitting: Internal Medicine

## 2013-11-10 VITALS — BP 122/78 | HR 79 | Temp 97.8°F | Ht 60.0 in | Wt 118.5 lb

## 2013-11-10 DIAGNOSIS — J441 Chronic obstructive pulmonary disease with (acute) exacerbation: Secondary | ICD-10-CM

## 2013-11-10 DIAGNOSIS — J42 Unspecified chronic bronchitis: Secondary | ICD-10-CM

## 2013-11-10 DIAGNOSIS — E785 Hyperlipidemia, unspecified: Secondary | ICD-10-CM

## 2013-11-10 MED ORDER — PREDNISONE 10 MG PO TABS: ORAL_TABLET | ORAL | Status: DC

## 2013-11-10 MED ORDER — CEPHALEXIN 500 MG PO CAPS: 500.0000 mg | ORAL_CAPSULE | Freq: Four times a day (QID) | ORAL | Status: DC

## 2013-11-10 MED ORDER — METHYLPREDNISOLONE ACETATE 80 MG/ML IJ SUSP: 80.0000 mg | Freq: Once | INTRAMUSCULAR | Status: DC

## 2013-11-10 MED ORDER — METHYLPREDNISOLONE ACETATE 80 MG/ML IJ SUSP
80.0000 mg | Freq: Once | INTRAMUSCULAR | Status: AC
Start: 2013-11-10 — End: 2013-11-10
  Administered 2013-11-10: 80 mg via INTRAMUSCULAR

## 2013-11-10 MED ORDER — HYDROCODONE-HOMATROPINE 5-1.5 MG/5ML PO SYRP: 5.0000 mL | ORAL_SOLUTION | Freq: Four times a day (QID) | ORAL | Status: DC | PRN

## 2013-11-10 NOTE — Assessment & Plan Note (Signed)
With flare as above, also for cxr - r/o pna

## 2013-11-10 NOTE — Patient Instructions (Addendum)
Please check with [facility] Medicare about coverage for screening CT chest   You had the steroid shot today  Please take all new medication as prescribed  - the antibiotic, cough medicine, prednisone  Please continue all other medications as before, including the inhalers  Please have the pharmacy call with any other refills you may need.

## 2013-11-10 NOTE — Assessment & Plan Note (Signed)
Mild to mod, for repeat antibx, cough med, predpack, cont inhalers,  to f/u any worsening symptoms or concerns

## 2013-11-10 NOTE — Assessment & Plan Note (Signed)
Back to taking statin regularly, no side effect, tyring lower chol diet, delcines labs today

## 2013-11-10 NOTE — Progress Notes (Signed)
Subjective:    Patient ID: Michele Martin, female    DOB: November 18, 1945, 68 y.o.   MRN: [id number]  HPI  Here to fu recent visit with Dr Linna Darner with copd exac tx with levaquin/predpack and cont'd inhalers.  Pt initially had some improvement in symptoms, then worse again in last few days depiste good med complicnae, with increased general weakness and malaise, sqeeky voice and hoarseness, prod cough, greenish/yellow, ? Low grade fever, mild ST (?from coughing) and bilat lower lateral CP with cough, and mild sob.  Pt denies chest pain, increased sob or doe, wheezing, orthopnea, PND, increased LE swelling, palpitations, dizziness or syncope except for the above.  Pt denies new neurological symptoms such as new headache, or facial or extremity weakness or numbness   Pt denies polydipsia, polyuria.  Recent LDl last visit very high, admits to not taking statin, now back to taking regularly. Past Medical History  Diagnosis Date  . HYPERLIPIDEMIA 09/19/2009  . ANXIETY 04/15/2010  . DEPRESSION 09/19/2009  . Acute angle-closure glaucoma 02/14/2010  . ALLERGIC RHINITIS 09/19/2009  . CHRONIC OBSTRUCTIVE PULMONARY DISEASE, ACUTE EXACERBATION 12/19/2009  . ASTHMA 09/19/2009  . COPD 06/27/2009  . BACK PAIN 02/14/2010  . TREMOR 02/14/2010  . HOARSENESS 10/17/2009  . Wheezing 06/27/2009  . MENOPAUSAL DISORDER 08/20/2010  . TINNITUS 09/10/2010  . URI 09/10/2010  . OSTEOPENIA 09/10/2010  . Chronic bronchitis 04/14/2011  . Rotator cuff tear 06/23/2011  . Tremor 07/30/2011  . Arthritis   . Complication of anesthesia    Past Surgical History  Procedure Laterality Date  . Normal heart cath  2001    Dr. Johnsie Cancel  . Abdominal hysterectomy    . S/p laser tx for glaucoma      no vision loss  . Cervical fusion 2013 - dr Vertell Limber      reports that she quit smoking about 2 years ago. Her smoking use included Cigarettes. She has a 40 pack-year smoking history. She has never used smokeless tobacco. She reports that she does not  drink alcohol or use illicit drugs. family history includes Dementia in her mother. There is no history of Colon cancer. Allergies  Allergen Reactions  . Pravastatin     REACTION: itch and leg cramp  . Simvastatin     REACTION: itch and leg cramps  . Statins    Current Outpatient Prescriptions on File Prior to Visit  Medication Sig Dispense Refill  . aspirin 81 MG EC tablet Take 81 mg by mouth daily.        [name] atorvastatin (LIPITOR) 20 MG tablet Take 10 mg by mouth daily.      . budesonide-formoterol (SYMBICORT) 160-4.5 MCG/ACT inhaler Inhale 2 puffs into the lungs 2 (two) times daily.  1 Inhaler  10  . clonazePAM (KLONOPIN) 0.5 MG tablet TAKE ONE TABLET TWICE DAILY AS NEEDED ANXIETY  15 tablet  0  . cyclobenzaprine (FLEXERIL) 5 MG tablet Take 1 tablet (5 mg total) by mouth 3 (three) times daily as needed for muscle spasms.  60 tablet  1  . escitalopram (LEXAPRO) 20 MG tablet Take 10 mg by mouth daily.      . fexofenadine (ALLEGRA) 180 MG tablet Take 180 mg by mouth daily.        [name] HYDROcodone-acetaminophen (NORCO) 7.5-325 MG per tablet Take 1 tablet by mouth every 6 (six) hours as needed for moderate pain.  60 tablet  0  . montelukast (SINGULAIR) 10 MG tablet Take 1 tablet (10 mg total) by  mouth at bedtime.  30 tablet  11  . tiotropium (SPIRIVA HANDIHALER) 18 MCG inhalation capsule Place 1 capsule (18 mcg total) into inhaler and inhale daily.  90 capsule  3  . traMADol (ULTRAM) 50 MG tablet Take 0.5-1 tablets (25-50 mg total) by mouth every 6 (six) hours as needed. For pain  60 tablet  2   No current facility-administered medications on file prior to visit.   Review of Systems  Constitutional: Negative for unexpected weight change, or unusual diaphoresis  HENT: Negative for tinnitus.   Eyes: Negative for photophobia and visual disturbance.  Respiratory: Negative for choking and stridor.   Gastrointestinal: Negative for vomiting and blood in stool.  Genitourinary: Negative for  hematuria and decreased urine volume.  Musculoskeletal: Negative for acute joint swelling Skin: Negative for color change and wound.  Neurological: Negative for tremors and numbness other than noted  Psychiatric/Behavioral: Negative for decreased concentration or  hyperactivity.       Objective:   Physical Exam BP 122/78  Pulse 79  Temp(Src) 97.8 F (36.6 C) (Oral)  Ht 5' (1.524 m)  Wt 118 lb 8 oz (53.751 kg)  BMI 23.14 kg/m2  SpO2 91% VS noted,  Constitutional: Pt appears well-developed and well-nourished.  HENT: Head: NCAT.  Right Ear: External ear normal.  Left Ear: External ear normal.  Eyes: Conjunctivae and EOM are normal. Pupils are equal, round, and reactive to light.  Neck: Normal range of motion. Neck supple.  Cardiovascular: Normal rate and regular rhythm.   Pulmonary/Chest: Effort normal and breath sounds normal.  Abd:  Soft, NT, non-distended, + BS Neurological: Pt is alert. Not confused  Skin: Skin is warm. No erythema.  Psychiatric: Pt behavior is normal. Thought content normal.     Assessment & Plan:

## 2013-11-10 NOTE — Progress Notes (Signed)
Pre visit review using our clinic review tool, if applicable. No additional management support is needed unless otherwise documented below in the visit note. 

## 2013-12-13 ENCOUNTER — Ambulatory Visit (INDEPENDENT_AMBULATORY_CARE_PROVIDER_SITE_OTHER): Payer: Medicare Other | Admitting: Internal Medicine

## 2013-12-13 ENCOUNTER — Encounter: Payer: Self-pay | Admitting: Internal Medicine

## 2013-12-13 VITALS — BP 112/70 | HR 95 | Temp 98.4°F | Wt 122.0 lb

## 2013-12-13 DIAGNOSIS — M545 Low back pain, unspecified: Secondary | ICD-10-CM

## 2013-12-13 DIAGNOSIS — F17200 Nicotine dependence, unspecified, uncomplicated: Secondary | ICD-10-CM

## 2013-12-13 DIAGNOSIS — J441 Chronic obstructive pulmonary disease with (acute) exacerbation: Secondary | ICD-10-CM

## 2013-12-13 MED ORDER — PREDNISONE 10 MG PO TABS: ORAL_TABLET | ORAL | Status: DC

## 2013-12-13 MED ORDER — METHYLPREDNISOLONE ACETATE 80 MG/ML IJ SUSP
80.0000 mg | Freq: Once | INTRAMUSCULAR | Status: AC
  Administered 2013-12-13: 80 mg via INTRAMUSCULAR

## 2013-12-13 MED ORDER — HYDROCODONE-ACETAMINOPHEN 7.5-325 MG PO TABS: 1.0000 | ORAL_TABLET | Freq: Four times a day (QID) | ORAL | Status: DC | PRN

## 2013-12-13 NOTE — Assessment & Plan Note (Signed)
Mild to mod, for depomedrol IM, predpac asd, cont symbicort,  to f/u any worsening symptoms or concerns

## 2013-12-13 NOTE — Assessment & Plan Note (Signed)
Suspect flare of pain likely related to underlying djd or ddd flare, doubt infectious, malignant or other , for steroid tx as above, pain control, consider repeat plain films, neuro no change, consider MRI for ongoing pain or surgical eval

## 2013-12-13 NOTE — Progress Notes (Signed)
Subjective:    Patient ID: Michele Martin, female    DOB: 1945/09/05, 68 y.o.   MRN: [id number]  HPI  Here to f/u, c/o new onset 3 wks mid lower back pain. Mod to severe, without bowel or bladder change, fever, wt loss,  worsening LE pain/numbness/weakness, gait change or falls.  Has remote imaging yrs ago she thinks showed lumbar DJD, has known signficant c-spine deg dz as well.  Pain nearly constant, worse to bend and walk despite mult otc, only improves with lying flat.  Also incidentally with 3 days mild worsening sob/doe/mild prod cough whitish, Pt denies chest pain, orthopnea, PND, increased LE swelling, palpitations, dizziness or syncope.  Has called her insurance company, and she also would like the low dose CT chest for lung ca screening. Past Medical History  Diagnosis Date  . HYPERLIPIDEMIA 09/19/2009  . ANXIETY 04/15/2010  . DEPRESSION 09/19/2009  . Acute angle-closure glaucoma 02/14/2010  . ALLERGIC RHINITIS 09/19/2009  . CHRONIC OBSTRUCTIVE PULMONARY DISEASE, ACUTE EXACERBATION 12/19/2009  . ASTHMA 09/19/2009  . COPD 06/27/2009  . BACK PAIN 02/14/2010  . TREMOR 02/14/2010  . HOARSENESS 10/17/2009  . Wheezing 06/27/2009  . MENOPAUSAL DISORDER 08/20/2010  . TINNITUS 09/10/2010  . URI 09/10/2010  . OSTEOPENIA 09/10/2010  . Chronic bronchitis 04/14/2011  . Rotator cuff tear 06/23/2011  . Tremor 07/30/2011  . Arthritis   . Complication of anesthesia    Past Surgical History  Procedure Laterality Date  . Normal heart cath  2001    Dr. Johnsie Cancel  . Abdominal hysterectomy    . S/p laser tx for glaucoma      no vision loss  . Cervical fusion 2013 - dr Vertell Limber      reports that she quit smoking about 2 years ago. Her smoking use included Cigarettes. She has a 40 pack-year smoking history. She has never used smokeless tobacco. She reports that she does not drink alcohol or use illicit drugs. family history includes Dementia in her mother. There is no history of Colon cancer. Allergies    Allergen Reactions  . Pravastatin     REACTION: itch and leg cramp  . Simvastatin     REACTION: itch and leg cramps  . Statins    Current Outpatient Prescriptions on File Prior to Visit  Medication Sig Dispense Refill  . aspirin 81 MG EC tablet Take 81 mg by mouth daily.        [name] atorvastatin (LIPITOR) 20 MG tablet Take 10 mg by mouth daily.      . budesonide-formoterol (SYMBICORT) 160-4.5 MCG/ACT inhaler Inhale 2 puffs into the lungs 2 (two) times daily.  1 Inhaler  10  . clonazePAM (KLONOPIN) 0.5 MG tablet TAKE ONE TABLET TWICE DAILY AS NEEDED ANXIETY  15 tablet  0  . cyclobenzaprine (FLEXERIL) 5 MG tablet Take 1 tablet (5 mg total) by mouth 3 (three) times daily as needed for muscle spasms.  60 tablet  1  . escitalopram (LEXAPRO) 20 MG tablet Take 10 mg by mouth daily.      . fexofenadine (ALLEGRA) 180 MG tablet Take 180 mg by mouth daily.        . montelukast (SINGULAIR) 10 MG tablet Take 1 tablet (10 mg total) by mouth at bedtime.  30 tablet  11  . tiotropium (SPIRIVA HANDIHALER) 18 MCG inhalation capsule Place 1 capsule (18 mcg total) into inhaler and inhale daily.  90 capsule  3  . traMADol (ULTRAM) 50 MG tablet Take 0.5-1 tablets (25-50  mg total) by mouth every 6 (six) hours as needed. For pain  60 tablet  2   No current facility-administered medications on file prior to visit.   Review of Systems  Constitutional: Negative for unusual diaphoresis or other sweats  HENT: Negative for ringing in ear Eyes: Negative for double vision or worsening visual disturbance.  Respiratory: Negative for choking and stridor.   Gastrointestinal: Negative for vomiting or other signifcant bowel change Genitourinary: Negative for hematuria or decreased urine volume.  Musculoskeletal: Negative for other MSK pain or swelling Skin: Negative for color change and worsening wound.  Neurological: Negative for tremors and numbness other than noted  Psychiatric/Behavioral: Negative for decreased  concentration or agitation other than above       Objective:   Physical Exam BP 112/70  Pulse 95  Temp(Src) 98.4 F (36.9 C) (Oral)  Wt 122 lb (55.339 kg)  SpO2 94% VS noted,  Constitutional: Pt appears thin HENT: Head: NCAT.  Right Ear: External ear normal.  Left Ear: External ear normal.  Eyes: . Pupils are equal, round, and reactive to light. Conjunctivae and EOM are normal Neck: Normal range of motion. Neck supple.  Cardiovascular: Normal rate and regular rhythm.   Pulmonary/Chest: Effort normal and breath sounds decreased, few wheezes bilat, no rales or rhonchi   Abd:  Soft, NT, ND, + BS Spine nontender but points to specific L5-s1 level at midline as source or pain, no paravertebral tender or swelling Neurological: Pt is alert. Not confused , motor grossly intact Skin: Skin is warm. No rash, No LE edema Psychiatric: Pt behavior is normal. No agitation.     Assessment & Plan:

## 2013-12-13 NOTE — Patient Instructions (Signed)
You had the steroid shot today, for the lower back pain AND the wheezing  Please take all new medication as prescribed - the prednisone, and pain medication  You will be contacted regarding the referral for: CT scan chest (low dose, for lung cancer screening)  Please continue all other medications as before, including the muscle relaxer  Please have the pharmacy call with any other refills you may need.  Please keep your appointments with your specialists as you may have planned  Please stop smoking.

## 2013-12-13 NOTE — Assessment & Plan Note (Signed)
For ct chest low dose/lung ca screening, to stop smoking

## 2013-12-13 NOTE — Progress Notes (Signed)
Pre visit review using our clinic review tool, if applicable. No additional management support is needed unless otherwise documented below in the visit note. 

## 2013-12-14 ENCOUNTER — Telehealth: Payer: Self-pay | Admitting: Internal Medicine

## 2013-12-14 MED ORDER — ALBUTEROL SULFATE (2.5 MG/3ML) 0.083% IN NEBU: 2.5000 mg | INHALATION_SOLUTION | Freq: Four times a day (QID) | RESPIRATORY_TRACT | Status: DC | PRN

## 2013-12-14 MED ORDER — ALBUTEROL SULFATE HFA 108 (90 BASE) MCG/ACT IN AERS: 2.0000 | INHALATION_SPRAY | Freq: Four times a day (QID) | RESPIRATORY_TRACT | Status: DC | PRN

## 2013-12-14 NOTE — Telephone Encounter (Signed)
Message copied by Biagio Borg on Wed Dec 14, 2013  6:48 PM ------      Message from: Jamesetta Orleans      Created: Tue Dec 13, 2013  6:47 PM       The patient needs her nebulizer medication and rescue inhaler sent to the pharmacy, both were not on current list. ------

## 2013-12-14 NOTE — Telephone Encounter (Signed)
Ok to let pt know, neb and HFA inhaler are renewed

## 2013-12-15 NOTE — Telephone Encounter (Signed)
Patient informed rx sent in.

## 2013-12-26 ENCOUNTER — Telehealth: Payer: Self-pay | Admitting: Internal Medicine

## 2013-12-26 NOTE — Telephone Encounter (Signed)
Dr Jenny Reichmann insurance denied patient CT Scan

## 2014-01-06 ENCOUNTER — Ambulatory Visit (INDEPENDENT_AMBULATORY_CARE_PROVIDER_SITE_OTHER): Payer: Medicare Other | Admitting: Internal Medicine

## 2014-01-06 ENCOUNTER — Encounter: Payer: Self-pay | Admitting: Internal Medicine

## 2014-01-06 VITALS — BP 112/70 | HR 80 | Temp 97.9°F | Wt 120.5 lb

## 2014-01-06 DIAGNOSIS — F411 Generalized anxiety disorder: Secondary | ICD-10-CM

## 2014-01-06 DIAGNOSIS — M545 Low back pain, unspecified: Secondary | ICD-10-CM

## 2014-01-06 DIAGNOSIS — J449 Chronic obstructive pulmonary disease, unspecified: Secondary | ICD-10-CM

## 2014-01-06 MED ORDER — OXYCODONE HCL 5 MG PO TABS: 5.0000 mg | ORAL_TABLET | ORAL | Status: DC | PRN

## 2014-01-06 NOTE — Progress Notes (Signed)
Subjective:    Patient ID: Michele Martin, female    DOB: Jan 09, 1946, 68 y.o.   MRN: [id number]  HPI  Here to c/o ongoing and unfort worsening mid LBP, points to mid lumbar, ok to sit and lie down, but gets quite severe with ambulation, some radiation to the buttocks, but none to legs and no clear radicular symptoms/weakness/numbness.  Has some "swaying" to her gait that many persons comment to her about in the past mo that she was unaware. No falls.  No fever, or other trauma.  No hx of LS spine films/mri.  Has seen Dr Vertell Limber in past for known mod to severe c-spine cervical spinal stenosis.  No bowel or bladder changed. Streoid tx last visit no help.  norco 7.5 not working. Pt denies chest pain, increased sob or doe, wheezing, orthopnea, PND, increased LE swelling, palpitations, dizziness or syncope. Denies worsening depressive symptoms, suicidal ideation, or panic; has ongoing anxiety, not increased recently.  Past Medical History  Diagnosis Date  . HYPERLIPIDEMIA 09/19/2009  . ANXIETY 04/15/2010  . DEPRESSION 09/19/2009  . Acute angle-closure glaucoma 02/14/2010  . ALLERGIC RHINITIS 09/19/2009  . CHRONIC OBSTRUCTIVE PULMONARY DISEASE, ACUTE EXACERBATION 12/19/2009  . ASTHMA 09/19/2009  . COPD 06/27/2009  . BACK PAIN 02/14/2010  . TREMOR 02/14/2010  . HOARSENESS 10/17/2009  . Wheezing 06/27/2009  . MENOPAUSAL DISORDER 08/20/2010  . TINNITUS 09/10/2010  . URI 09/10/2010  . OSTEOPENIA 09/10/2010  . Chronic bronchitis 04/14/2011  . Rotator cuff tear 06/23/2011  . Tremor 07/30/2011  . Arthritis   . Complication of anesthesia    Past Surgical History  Procedure Laterality Date  . Normal heart cath  2001    Dr. Johnsie Cancel  . Abdominal hysterectomy    . S/p laser tx for glaucoma      no vision loss  . Cervical fusion 2013 - dr Vertell Limber      reports that she quit smoking about 2 years ago. Her smoking use included Cigarettes. She has a 40 pack-year smoking history. She has never used smokeless tobacco.  She reports that she does not drink alcohol or use illicit drugs. family history includes Dementia in her mother. There is no history of Colon cancer. Allergies  Allergen Reactions  . Pravastatin     REACTION: itch and leg cramp  . Simvastatin     REACTION: itch and leg cramps  . Statins    Current Outpatient Prescriptions on File Prior to Visit  Medication Sig Dispense Refill  . albuterol (PROVENTIL HFA) 108 (90 BASE) MCG/ACT inhaler Inhale 2 puffs into the lungs every 6 (six) hours as needed for wheezing or shortness of breath.  18 g  5  . albuterol (PROVENTIL) (2.5 MG/3ML) 0.083% nebulizer solution Take 3 mLs (2.5 mg total) by nebulization every 6 (six) hours as needed for wheezing or shortness of breath.  150 mL  1  . aspirin 81 MG EC tablet Take 81 mg by mouth daily.        [name] atorvastatin (LIPITOR) 20 MG tablet Take 10 mg by mouth daily.      . budesonide-formoterol (SYMBICORT) 160-4.5 MCG/ACT inhaler Inhale 2 puffs into the lungs 2 (two) times daily.  1 Inhaler  10  . clonazePAM (KLONOPIN) 0.5 MG tablet TAKE ONE TABLET TWICE DAILY AS NEEDED ANXIETY  15 tablet  0  . cyclobenzaprine (FLEXERIL) 5 MG tablet Take 1 tablet (5 mg total) by mouth 3 (three) times daily as needed for muscle spasms.  60 tablet  1  . escitalopram (LEXAPRO) 20 MG tablet Take 10 mg by mouth daily.      . fexofenadine (ALLEGRA) 180 MG tablet Take 180 mg by mouth daily.        [name] HYDROcodone-acetaminophen (NORCO) 7.5-325 MG per tablet Take 1 tablet by mouth every 6 (six) hours as needed for moderate pain.  60 tablet  0  . montelukast (SINGULAIR) 10 MG tablet Take 1 tablet (10 mg total) by mouth at bedtime.  30 tablet  11  . tiotropium (SPIRIVA HANDIHALER) 18 MCG inhalation capsule Place 1 capsule (18 mcg total) into inhaler and inhale daily.  90 capsule  3  . traMADol (ULTRAM) 50 MG tablet Take 0.5-1 tablets (25-50 mg total) by mouth every 6 (six) hours as needed. For pain  60 tablet  2   No current  facility-administered medications on file prior to visit.   Review of Systems  Constitutional: Negative for unusual diaphoresis or other sweats  HENT: Negative for ringing in ear Eyes: Negative for double vision or worsening visual disturbance.  Respiratory: Negative for choking and stridor.   Gastrointestinal: Negative for vomiting or other signifcant bowel change Genitourinary: Negative for hematuria or decreased urine volume.  Musculoskeletal: Negative for other MSK pain or swelling Skin: Negative for color change and worsening wound.  Neurological: Negative for tremors and numbness other than noted  Psychiatric/Behavioral: Negative for decreased concentration or agitation other than above       Objective:   Physical Exam BP 112/70  Pulse 80  Temp(Src) 97.9 F (36.6 C) (Oral)  Wt 120 lb 8 oz (54.658 kg)  SpO2 93% VS noted,  Constitutional: Pt appears well-developed, well-nourished.  HENT: Head: NCAT.  Right Ear: External ear normal.  Left Ear: External ear normal.  Eyes: . Pupils are equal, round, and reactive to light. Conjunctivae and EOM are normal Neck: Normal range of motion. Neck supple.  Cardiovascular: Normal rate and regular rhythm.   Pulmonary/Chest: Effort normal and breath sounds normal.  Abd:  Soft, NT, ND, + BS Neurological: Pt is alert. Not confused , motor 5/5, dtr intact to LE's, gait stiff and slow, slightly bent Skin: Skin is warm. No rash Psychiatric: Pt behavior is normal. No agitation.  No spinal tenderness, swelling, rash Has some mild bilat lumbar paravertebral spasm incidental I suspect     Assessment & Plan:

## 2014-01-06 NOTE — Assessment & Plan Note (Signed)
stable overall by history and exam, , and pt to continue medical treatment as before,  to f/u any worsening symptoms or concerns  

## 2014-01-06 NOTE — Assessment & Plan Note (Signed)
stable overall by history and exam, recent data reviewed with pt, and pt to continue medical treatment as before,  to f/u any worsening symptoms or concerns SpO2 Readings from Last 3 Encounters:  01/06/14 93%  12/13/13 94%  11/10/13 91%

## 2014-01-06 NOTE — Progress Notes (Signed)
Pre visit review using our clinic review tool, if applicable. No additional management support is needed unless otherwise documented below in the visit note. 

## 2014-01-06 NOTE — Assessment & Plan Note (Signed)
Persistent worsening pain with ambulation, high suspicion for spinal stenosis, for oxycodone prn, cont flexeril prn, MRI LS Spine, refer Dr Marcelino Freestone for further evaluation

## 2014-01-06 NOTE — Patient Instructions (Addendum)
OK to stop the hydrocodone  Please take all new medication as prescribed - the oxycodone  Please continue all other medications as before, and refills have been done if requested.  Please have the pharmacy call with any other refills you may need.  Please continue your efforts at being more active, low cholesterol diet, and weight control.  Please keep your appointments with your specialists as you may have planned  You will be contacted regarding the referral for: MRI for the lower back, and Dr Vertell Limber

## 2014-01-17 ENCOUNTER — Ambulatory Visit: Payer: Medicare Other | Admitting: Internal Medicine

## 2014-01-19 ENCOUNTER — Ambulatory Visit
Admission: RE | Admit: 2014-01-19 | Discharge: 2014-01-19 | Disposition: A | Discharge status: Home / Self Care | Payer: Medicare Other | Source: Ambulatory Visit | Attending: Internal Medicine | Admitting: Internal Medicine

## 2014-01-19 DIAGNOSIS — M545 Low back pain, unspecified: Secondary | ICD-10-CM

## 2014-01-26 ENCOUNTER — Other Ambulatory Visit: Payer: Self-pay

## 2014-01-26 MED ORDER — BUDESONIDE-FORMOTEROL FUMARATE 160-4.5 MCG/ACT IN AERO
2.0000 | INHALATION_SPRAY | Freq: Two times a day (BID) | RESPIRATORY_TRACT | Status: DC
Start: 2014-01-26 — End: 2015-01-23

## 2014-02-10 ENCOUNTER — Other Ambulatory Visit: Payer: Self-pay | Admitting: Internal Medicine

## 2014-02-14 NOTE — Telephone Encounter (Signed)
Phoned in refill for Clonazepam 0.5 mg #15 per Dr. Alain Marion instructions to [facility].

## 2014-03-10 ENCOUNTER — Other Ambulatory Visit: Payer: Self-pay | Admitting: Neurosurgery

## 2014-03-17 ENCOUNTER — Telehealth: Payer: Self-pay | Admitting: Internal Medicine

## 2014-03-17 NOTE — Telephone Encounter (Signed)
Pt request phone call from the assistant concern about test result.

## 2014-03-17 NOTE — Telephone Encounter (Signed)
Called the patient back and she stated we should received something from Dr. Maryjean Ka office regarding her back surgery.

## 2014-03-17 NOTE — Telephone Encounter (Signed)
Informed once received would take care of request.

## 2014-03-21 ENCOUNTER — Other Ambulatory Visit: Payer: Self-pay | Admitting: Neurosurgery

## 2014-03-21 DIAGNOSIS — M8580 Other specified disorders of bone density and structure, unspecified site: Secondary | ICD-10-CM

## 2014-03-21 DIAGNOSIS — Q675 Congenital deformity of spine: Secondary | ICD-10-CM

## 2014-03-29 ENCOUNTER — Ambulatory Visit
Admission: RE | Admit: 2014-03-29 | Discharge: 2014-03-29 | Disposition: A | Discharge status: Home / Self Care | Payer: Medicare Other | Source: Ambulatory Visit | Attending: Neurosurgery | Admitting: Neurosurgery

## 2014-03-29 DIAGNOSIS — M8580 Other specified disorders of bone density and structure, unspecified site: Secondary | ICD-10-CM

## 2014-04-20 ENCOUNTER — Ambulatory Visit (INDEPENDENT_AMBULATORY_CARE_PROVIDER_SITE_OTHER): Payer: Medicare Other | Admitting: Internal Medicine

## 2014-04-20 ENCOUNTER — Encounter: Payer: Self-pay | Admitting: Internal Medicine

## 2014-04-20 VITALS — BP 122/70 | HR 83 | Temp 98.7°F | Wt 120.5 lb

## 2014-04-20 DIAGNOSIS — F3289 Other specified depressive episodes: Secondary | ICD-10-CM

## 2014-04-20 DIAGNOSIS — F329 Major depressive disorder, single episode, unspecified: Secondary | ICD-10-CM

## 2014-04-20 DIAGNOSIS — E785 Hyperlipidemia, unspecified: Secondary | ICD-10-CM

## 2014-04-20 DIAGNOSIS — Z23 Encounter for immunization: Secondary | ICD-10-CM

## 2014-04-20 DIAGNOSIS — J438 Other emphysema: Secondary | ICD-10-CM

## 2014-04-20 DIAGNOSIS — M810 Age-related osteoporosis without current pathological fracture: Secondary | ICD-10-CM | POA: Insufficient documentation

## 2014-04-20 DIAGNOSIS — Z0000 Encounter for general adult medical examination without abnormal findings: Secondary | ICD-10-CM

## 2014-04-20 HISTORY — DX: Age-related osteoporosis without current pathological fracture: M81.0

## 2014-04-20 LAB — PULMONARY FUNCTION TEST
DL/VA % pred: 88 %
DL/VA: 3.89 ml/min/mmHg/L
DLCO unc % pred: 64 %
DLCO unc: 13.07 ml/min/mmHg
FEF 25-75 Post: 0.6 L/sec
FEF 25-75 Pre: 0.38 L/sec
FEF2575-%Change-Post: 59 %
FEF2575-%Pred-Post: 33 %
FEF2575-%Pred-Pre: 20 %
FEV1-%Change-Post: 20 %
FEV1-%Pred-Post: 46 %
FEV1-%Pred-Pre: 38 %
FEV1-Post: 0.94 L
FEV1-Pre: 0.78 L
FEV1FVC-%Change-Post: -2 %
FEV1FVC-%Pred-Pre: 74 %
FEV6-%Change-Post: 20 %
FEV6-%Pred-Post: 64 %
FEV6-%Pred-Pre: 53 %
FEV6-Post: 1.65 L
FEV6-Pre: 1.37 L
FEV6FVC-%Change-Post: -2 %
FEV6FVC-%Pred-Post: 100 %
FEV6FVC-%Pred-Pre: 102 %
FVC-%Change-Post: 22 %
FVC-%Pred-Post: 63 %
FVC-%Pred-Pre: 51 %
FVC-Post: 1.71 L
Post FEV1/FVC ratio: 55 %
Post FEV6/FVC ratio: 97 %
Pre FEV1/FVC ratio: 56 %
Pre FEV6/FVC Ratio: 99 %

## 2014-04-20 NOTE — Progress Notes (Signed)
Pre visit review using our clinic review tool, if applicable. No additional management support is needed unless otherwise documented below in the visit note. 

## 2014-04-20 NOTE — Progress Notes (Signed)
Subjective:    Patient ID: Michele Martin, female    DOB: 17-May-1946, 68 y.o.   MRN: [id number]  HPI  Herer to f/u, no fever or worsening cough, and Pt denies chest pain, increased sob or doe, wheezing, orthopnea, PND, increased LE swelling, palpitations, dizziness or syncope.  Also with recent dxa, requests tx. Pt denies new neurological symptoms such as new headache, or facial or extremity weakness or numbness   Pt denies polydipsia, polyuria,  Denies worsening depressive symptoms, suicidal ideation, or panic Past Medical History  Diagnosis Date  . HYPERLIPIDEMIA 09/19/2009  . ANXIETY 04/15/2010  . DEPRESSION 09/19/2009  . Acute angle-closure glaucoma 02/14/2010  . ALLERGIC RHINITIS 09/19/2009  . CHRONIC OBSTRUCTIVE PULMONARY DISEASE, ACUTE EXACERBATION 12/19/2009  . ASTHMA 09/19/2009  . COPD 06/27/2009  . BACK PAIN 02/14/2010  . TREMOR 02/14/2010  . HOARSENESS 10/17/2009  . Wheezing 06/27/2009  . MENOPAUSAL DISORDER 08/20/2010  . TINNITUS 09/10/2010  . URI 09/10/2010  . OSTEOPENIA 09/10/2010  . Chronic bronchitis 04/14/2011  . Rotator cuff tear 06/23/2011  . Tremor 07/30/2011  . Arthritis   . Complication of anesthesia   . Osteoporosis, unspecified 04/20/2014   Past Surgical History  Procedure Laterality Date  . Normal heart cath  2001    Dr. Johnsie Cancel  . Abdominal hysterectomy    . S/p laser tx for glaucoma      no vision loss  . Cervical fusion 2013 - dr Vertell Limber      reports that she quit smoking about 2 years ago. Her smoking use included Cigarettes. She has a 40 pack-year smoking history. She has never used smokeless tobacco. She reports that she does not drink alcohol or use illicit drugs. family history includes Dementia in her mother. There is no history of Colon cancer. Allergies  Allergen Reactions  . Pravastatin     REACTION: itch and leg cramp  . Simvastatin     REACTION: itch and leg cramps  . Statins    Current Outpatient Prescriptions on File Prior to Visit    Medication Sig Dispense Refill  . albuterol (PROVENTIL HFA) 108 (90 BASE) MCG/ACT inhaler Inhale 2 puffs into the lungs every 6 (six) hours as needed for wheezing or shortness of breath.  18 g  5  . albuterol (PROVENTIL) (2.5 MG/3ML) 0.083% nebulizer solution Take 3 mLs (2.5 mg total) by nebulization every 6 (six) hours as needed for wheezing or shortness of breath.  150 mL  1  . aspirin 81 MG EC tablet Take 81 mg by mouth daily.        [name] atorvastatin (LIPITOR) 20 MG tablet Take 10 mg by mouth daily.      . budesonide-formoterol (SYMBICORT) 160-4.5 MCG/ACT inhaler Inhale 2 puffs into the lungs 2 (two) times daily.  1 Inhaler  10  . clonazePAM (KLONOPIN) 0.5 MG tablet TAKE 1 TABLET BY MOUTH TWICE DAILY AS NEEDED FOR ANXIETY  15 tablet  0  . cyclobenzaprine (FLEXERIL) 5 MG tablet Take 1 tablet (5 mg total) by mouth 3 (three) times daily as needed for muscle spasms.  60 tablet  1  . escitalopram (LEXAPRO) 20 MG tablet Take 10 mg by mouth daily.      . fexofenadine (ALLEGRA) 180 MG tablet Take 180 mg by mouth daily.        . montelukast (SINGULAIR) 10 MG tablet Take 1 tablet (10 mg total) by mouth at bedtime.  30 tablet  11  . oxyCODONE (OXY IR/ROXICODONE) 5 MG immediate  release tablet Take 1 tablet (5 mg total) by mouth every 4 (four) hours as needed for severe pain.  60 tablet  0  . tiotropium (SPIRIVA HANDIHALER) 18 MCG inhalation capsule Place 1 capsule (18 mcg total) into inhaler and inhale daily.  90 capsule  3  . traMADol (ULTRAM) 50 MG tablet Take 0.5-1 tablets (25-50 mg total) by mouth every 6 (six) hours as needed. For pain  60 tablet  2   No current facility-administered medications on file prior to visit.   Review of Systems  Constitutional: Negative for unusual diaphoresis or other sweats  HENT: Negative for ringing in ear Eyes: Negative for double vision or worsening visual disturbance.  Respiratory: Negative for choking and stridor.   Gastrointestinal: Negative for vomiting or  other signifcant bowel change Genitourinary: Negative for hematuria or decreased urine volume.  Musculoskeletal: Negative for other MSK pain or swelling Skin: Negative for color change and worsening wound.  Neurological: Negative for tremors and numbness other than noted  Psychiatric/Behavioral: Negative for decreased concentration or agitation other than above       Objective:   Physical Exam BP 122/70  Pulse 83  Temp(Src) 98.7 F (37.1 C) (Oral)  Wt 120 lb 8 oz (54.658 kg)  SpO2 93% VS noted, non toxic Constitutional: Pt appears well-developed, well-nourished.  HENT: Head: NCAT.  Right Ear: External ear normal.  Left Ear: External ear normal.  Eyes: . Pupils are equal, round, and reactive to light. Conjunctivae and EOM are normal Neck: Normal range of motion. Neck supple.  Cardiovascular: Normal rate and regular rhythm.   Pulmonary/Chest: Effort normal and breath sounds decreased , no rales or wheezing.  Neurological: Pt is alert. Not confused , motor grossly intact Skin: Skin is warm. No rash Psychiatric: Pt behavior is normal. No agitation. not depressed affect    Assessment & Plan:

## 2014-04-20 NOTE — Patient Instructions (Signed)
We will ask Deirdre Peer from the office to check into your Prolia copay  Please continue all other medications as before, and refills have been done if requested.  Please have the pharmacy call with any other refills you may need.  Please continue your efforts at being more active, low cholesterol diet, and weight control.  Please keep your appointments with your specialists as you may have planned  Please return in 3 months, or sooner if needed, with Lab testing done 3-5 days before

## 2014-04-22 NOTE — Assessment & Plan Note (Signed)
D/w pt - ok for prolia start, will ask our office contact to arraange if affordable,  to f/u any worsening symptoms or concerns

## 2014-04-22 NOTE — Assessment & Plan Note (Signed)
stable overall by history and exam,, and pt to continue medical treatment as before,  to f/u any worsening symptoms or concerns Lab Results  Component Value Date   HGBA1C 5.6 08/01/2011   Lab Results  Component Value Date   LDLCALC 103* 08/17/2012   Cont lower chol diet and current meds, f/u next visit

## 2014-04-22 NOTE — Assessment & Plan Note (Signed)
stable overall by history and exam, recent data reviewed with pt, and pt to continue medical treatment as before,  to f/u any worsening symptoms or concerns SpO2 Readings from Last 3 Encounters:  04/20/14 93%  01/06/14 93%  12/13/13 94%

## 2014-04-22 NOTE — Assessment & Plan Note (Signed)
stable overall by history and exam, recent data reviewed with pt, and pt to continue medical treatment as before,  to f/u any worsening symptoms or concerns Lab Results  Component Value Date   WBC 6.5 07/19/2013   HGB 14.9 07/19/2013   HCT 44.2 07/19/2013   PLT 259.0 07/19/2013   GLUCOSE 85 07/19/2013   CHOL 225* 07/19/2013   TRIG 88.0 07/19/2013   HDL 60.00 07/19/2013   LDLDIRECT 164.4 07/19/2013   LDLCALC 103* 08/17/2012   ALT 13 07/19/2013   AST 19 07/19/2013   NA 140 07/19/2013   K 4.0 07/19/2013   CL 101 07/19/2013   CREATININE 0.6 07/19/2013   BUN 13 07/19/2013   CO2 31 07/19/2013   TSH 0.91 07/19/2013   HGBA1C 5.6 08/01/2011

## 2014-04-27 ENCOUNTER — Telehealth: Payer: Self-pay | Admitting: Internal Medicine

## 2014-04-27 NOTE — Telephone Encounter (Signed)
I have electronically sent pt's insurance info to be verified for Prolia injection and will notify you as soon as I have a response. Thank you.

## 2014-05-03 NOTE — Telephone Encounter (Signed)
I have rec'd pt's insurance verification info in reference to her Prolia injection.  Pt will have an estimated responsibility of a $10 co-pay plus a 20% co-insurance ($180), which means pt will have an estimated responsibility of $190.  Please make pt aware that this is an estimate and we have no way to determine an exact amt until her insurance has paid. I have sent a copy of the summary of benefits to be scanned into pt's chart.  NOTE:  If pt is not able to afford $190, please let me know.  [employer]rolia has a financial assistance program which may be able to help pt, if she qualifies. If she needs the number please let me know. If you have further questions, please let me know. Thank you.

## 2014-05-03 NOTE — Telephone Encounter (Signed)
Thanks [name] for all your hard work.  This patient has been informed of all information and the cost is ok with her.  She has scheduled her first Prolia injection.  Again thanks for all you do.

## 2014-05-16 ENCOUNTER — Ambulatory Visit (INDEPENDENT_AMBULATORY_CARE_PROVIDER_SITE_OTHER): Payer: Medicare Other

## 2014-05-16 DIAGNOSIS — M810 Age-related osteoporosis without current pathological fracture: Secondary | ICD-10-CM

## 2014-05-16 MED ORDER — DENOSUMAB 60 MG/ML ~~LOC~~ SOLN
60.0000 mg | Freq: Once | SUBCUTANEOUS | Status: AC
  Administered 2014-05-16: 60 mg via SUBCUTANEOUS

## 2014-06-05 ENCOUNTER — Other Ambulatory Visit: Payer: Self-pay | Admitting: Internal Medicine

## 2014-06-05 DIAGNOSIS — Z1231 Encounter for screening mammogram for malignant neoplasm of breast: Secondary | ICD-10-CM

## 2014-06-06 ENCOUNTER — Other Ambulatory Visit: Payer: Self-pay | Admitting: Internal Medicine

## 2014-06-06 DIAGNOSIS — N6320 Unspecified lump in the left breast, unspecified quadrant: Secondary | ICD-10-CM

## 2014-06-07 ENCOUNTER — Ambulatory Visit (INDEPENDENT_AMBULATORY_CARE_PROVIDER_SITE_OTHER): Payer: Medicare Other | Admitting: Internal Medicine

## 2014-06-07 ENCOUNTER — Encounter: Payer: Self-pay | Admitting: Internal Medicine

## 2014-06-07 VITALS — BP 132/80 | HR 80 | Temp 98.4°F | Wt 122.5 lb

## 2014-06-07 DIAGNOSIS — F32A Depression, unspecified: Secondary | ICD-10-CM

## 2014-06-07 DIAGNOSIS — J441 Chronic obstructive pulmonary disease with (acute) exacerbation: Secondary | ICD-10-CM

## 2014-06-07 DIAGNOSIS — J209 Acute bronchitis, unspecified: Secondary | ICD-10-CM

## 2014-06-07 DIAGNOSIS — F329 Major depressive disorder, single episode, unspecified: Secondary | ICD-10-CM

## 2014-06-07 MED ORDER — HYDROCODONE-HOMATROPINE 5-1.5 MG/5ML PO SYRP: 5.0000 mL | ORAL_SOLUTION | Freq: Four times a day (QID) | ORAL | Status: DC | PRN

## 2014-06-07 MED ORDER — PREDNISONE 10 MG PO TABS: ORAL_TABLET | ORAL | Status: DC

## 2014-06-07 MED ORDER — LEVOFLOXACIN 250 MG PO TABS: 250.0000 mg | ORAL_TABLET | Freq: Every day | ORAL | Status: DC

## 2014-06-07 MED ORDER — METHYLPREDNISOLONE ACETATE 80 MG/ML IJ SUSP
80.0000 mg | Freq: Once | INTRAMUSCULAR | Status: AC
  Administered 2014-06-07: 80 mg via INTRAMUSCULAR

## 2014-06-07 NOTE — Patient Instructions (Addendum)
You had the steroid shot today  Please take all new medication as prescribed - the antibiotic, cough medicine, and prednisone  Please continue all other medications as before, including your inhalers  Please have the pharmacy call with any other refills you may need.  Please keep your appointments with your specialists as you may have planned

## 2014-06-07 NOTE — Progress Notes (Signed)
Pre visit review using our clinic review tool, if applicable. No additional management support is needed unless otherwise documented below in the visit note. 

## 2014-06-08 NOTE — Assessment & Plan Note (Signed)
Mild to mod, for antibx course,  to f/u any worsening symptoms or concerns 

## 2014-06-08 NOTE — Assessment & Plan Note (Signed)
Mild to mod, for cough med, depomedrol IM, predpac asd,  to f/u any worsening symptoms or concerns

## 2014-06-08 NOTE — Progress Notes (Signed)
Subjective:    Patient ID: Michele Martin, female    DOB: May 15, 1946, 68 y.o.   MRN: [id number]  HPI Here with acute onset mild to mod 2-3 days ST, HA, general weakness and malaise, with prod cough greenish sputum, but Pt denies chest pain, increased sob or doe, wheezing, orthopnea, PND, increased LE swelling, palpitations, dizziness or syncope, except for onset mild wheezing last PM with mild sob.  Pt denies polydipsia, polyuria, Denies worsening depressive symptoms, suicidal ideation, or panic  Past Medical History  Diagnosis Date  . HYPERLIPIDEMIA 09/19/2009  . ANXIETY 04/15/2010  . DEPRESSION 09/19/2009  . Acute angle-closure glaucoma 02/14/2010  . ALLERGIC RHINITIS 09/19/2009  . CHRONIC OBSTRUCTIVE PULMONARY DISEASE, ACUTE EXACERBATION 12/19/2009  . ASTHMA 09/19/2009  . COPD 06/27/2009  . BACK PAIN 02/14/2010  . TREMOR 02/14/2010  . HOARSENESS 10/17/2009  . Wheezing 06/27/2009  . MENOPAUSAL DISORDER 08/20/2010  . TINNITUS 09/10/2010  . URI 09/10/2010  . OSTEOPENIA 09/10/2010  . Chronic bronchitis 04/14/2011  . Rotator cuff tear 06/23/2011  . Tremor 07/30/2011  . Arthritis   . Complication of anesthesia   . Osteoporosis, unspecified 04/20/2014   Past Surgical History  Procedure Laterality Date  . Normal heart cath  2001    Dr. Johnsie Cancel  . Abdominal hysterectomy    . S/p laser tx for glaucoma      no vision loss  . Cervical fusion 2013 - dr Vertell Limber      reports that she quit smoking about 2 years ago. Her smoking use included Cigarettes. She has a 40 pack-year smoking history. She has never used smokeless tobacco. She reports that she does not drink alcohol or use illicit drugs. family history includes Dementia in her mother. There is no history of Colon cancer. Allergies  Allergen Reactions  . Pravastatin     REACTION: itch and leg cramp  . Simvastatin     REACTION: itch and leg cramps  . Statins    Current Outpatient Prescriptions on File Prior to Visit  Medication Sig Dispense  Refill  . albuterol (PROVENTIL HFA) 108 (90 BASE) MCG/ACT inhaler Inhale 2 puffs into the lungs every 6 (six) hours as needed for wheezing or shortness of breath. 18 g 5  . albuterol (PROVENTIL) (2.5 MG/3ML) 0.083% nebulizer solution Take 3 mLs (2.5 mg total) by nebulization every 6 (six) hours as needed for wheezing or shortness of breath. 150 mL 1  . aspirin 81 MG EC tablet Take 81 mg by mouth daily.      [name] atorvastatin (LIPITOR) 20 MG tablet Take 10 mg by mouth daily.    . budesonide-formoterol (SYMBICORT) 160-4.5 MCG/ACT inhaler Inhale 2 puffs into the lungs 2 (two) times daily. 1 Inhaler 10  . clonazePAM (KLONOPIN) 0.5 MG tablet TAKE 1 TABLET BY MOUTH TWICE DAILY AS NEEDED FOR ANXIETY 15 tablet 0  . cyclobenzaprine (FLEXERIL) 5 MG tablet Take 1 tablet (5 mg total) by mouth 3 (three) times daily as needed for muscle spasms. 60 tablet 1  . escitalopram (LEXAPRO) 20 MG tablet Take 10 mg by mouth daily.    . fexofenadine (ALLEGRA) 180 MG tablet Take 180 mg by mouth daily.      . montelukast (SINGULAIR) 10 MG tablet Take 1 tablet (10 mg total) by mouth at bedtime. 30 tablet 11  . oxyCODONE (OXY IR/ROXICODONE) 5 MG immediate release tablet Take 1 tablet (5 mg total) by mouth every 4 (four) hours as needed for severe pain. 60 tablet 0  .  tiotropium (SPIRIVA HANDIHALER) 18 MCG inhalation capsule Place 1 capsule (18 mcg total) into inhaler and inhale daily. 90 capsule 3  . traMADol (ULTRAM) 50 MG tablet Take 0.5-1 tablets (25-50 mg total) by mouth every 6 (six) hours as needed. For pain 60 tablet 2   No current facility-administered medications on file prior to visit.   Review of Systems  Constitutional: Negative for unusual diaphoresis or other sweats  HENT: Negative for ringing in ear Eyes: Negative for double vision or worsening visual disturbance.  Respiratory: Negative for choking and stridor.   Gastrointestinal: Negative for vomiting or other signifcant bowel change Genitourinary: Negative  for hematuria or decreased urine volume.  Musculoskeletal: Negative for other MSK pain or swelling Skin: Negative for color change and worsening wound.  Neurological: Negative for tremors and numbness other than noted  Psychiatric/Behavioral: Negative for decreased concentration or agitation other than above       Objective:   Physical Exam BP 132/80 mmHg  Pulse 80  Temp(Src) 98.4 F (36.9 C) (Oral)  Wt 122 lb 8 oz (55.566 kg)  SpO2 92% VS noted, mild ill Constitutional: Pt appears well-developed, well-nourished.  HENT: Head: NCAT.  Right Ear: External ear normal.  Left Ear: External ear normal.  Eyes: . Pupils are equal, round, and reactive to light. Conjunctivae and EOM are normal Neck: Normal range of motion. Neck supple.  Bilat tm's with mild erythema.  Max sinus areas non tender.  Pharynx with mild erythema, no exudate Cardiovascular: Normal rate and regular rhythm.   Pulmonary/Chest: Effort normal and breath sounds decreased , few wheeze bilat.  Neurological: Pt is alert. Not confused , motor grossly intact Skin: Skin is warm. No rash Psychiatric: Pt behavior is normal. No agitation. mild nervous, not depressed affect     Assessment & Plan:

## 2014-06-08 NOTE — Assessment & Plan Note (Signed)
stable overall by history and exam, recent data reviewed with pt, and pt to continue medical treatment as before,  to f/u any worsening symptoms or concerns Lab Results  Component Value Date   WBC 6.5 07/19/2013   HGB 14.9 07/19/2013   HCT 44.2 07/19/2013   PLT 259.0 07/19/2013   GLUCOSE 85 07/19/2013   CHOL 225* 07/19/2013   TRIG 88.0 07/19/2013   HDL 60.00 07/19/2013   LDLDIRECT 164.4 07/19/2013   LDLCALC 103* 08/17/2012   ALT 13 07/19/2013   AST 19 07/19/2013   NA 140 07/19/2013   K 4.0 07/19/2013   CL 101 07/19/2013   CREATININE 0.6 07/19/2013   BUN 13 07/19/2013   CO2 31 07/19/2013   TSH 0.91 07/19/2013   HGBA1C 5.6 08/01/2011

## 2014-07-07 ENCOUNTER — Ambulatory Visit
Admission: RE | Admit: 2014-07-07 | Discharge: 2014-07-07 | Disposition: A | Discharge status: Home / Self Care | Payer: Medicare Other | Source: Ambulatory Visit | Attending: Internal Medicine | Admitting: Internal Medicine

## 2014-07-07 DIAGNOSIS — N6320 Unspecified lump in the left breast, unspecified quadrant: Secondary | ICD-10-CM

## 2014-07-11 LAB — HM MAMMOGRAPHY

## 2014-07-12 ENCOUNTER — Encounter: Payer: Self-pay | Admitting: Internal Medicine

## 2014-07-12 ENCOUNTER — Ambulatory Visit (INDEPENDENT_AMBULATORY_CARE_PROVIDER_SITE_OTHER): Payer: Medicare Other | Admitting: Internal Medicine

## 2014-07-12 VITALS — BP 140/82 | HR 87 | Temp 98.2°F | Ht 61.5 in | Wt 116.1 lb

## 2014-07-12 DIAGNOSIS — R109 Unspecified abdominal pain: Secondary | ICD-10-CM

## 2014-07-12 DIAGNOSIS — J441 Chronic obstructive pulmonary disease with (acute) exacerbation: Secondary | ICD-10-CM

## 2014-07-12 DIAGNOSIS — J209 Acute bronchitis, unspecified: Secondary | ICD-10-CM

## 2014-07-12 MED ORDER — PREDNISONE 10 MG PO TABS: ORAL_TABLET | ORAL | Status: DC

## 2014-07-12 MED ORDER — LEVOFLOXACIN 250 MG PO TABS: 250.0000 mg | ORAL_TABLET | Freq: Every day | ORAL | Status: DC

## 2014-07-12 MED ORDER — HYDROCODONE-HOMATROPINE 5-1.5 MG/5ML PO SYRP: 5.0000 mL | ORAL_SOLUTION | Freq: Four times a day (QID) | ORAL | Status: DC | PRN

## 2014-07-12 MED ORDER — METHYLPREDNISOLONE ACETATE 80 MG/ML IJ SUSP
80.0000 mg | Freq: Once | INTRAMUSCULAR | Status: AC
  Administered 2014-07-12: 80 mg via INTRAMUSCULAR

## 2014-07-12 NOTE — Assessment & Plan Note (Signed)
Mild to mod, for depomedrol IM, predpac asd,,  to f/u any worsening symptoms or concerns

## 2014-07-12 NOTE — Patient Instructions (Signed)
You had the steroid shot today  Please take all new medication as prescribed - the antibiotic, cough medicine, and prednisone  Please continue all other medications as before, including your inhalers  Please have the pharmacy call with any other refills you may need.  Please keep your appointments with your specialists as you may have planned

## 2014-07-12 NOTE — Assessment & Plan Note (Signed)
Mild to mod, for antibx course,  to f/u any worsening symptoms or concerns, decliens cxr

## 2014-07-12 NOTE — Assessment & Plan Note (Signed)
Exam benign, for CT if recurs/persists - r/o abd wall herniation right > left

## 2014-07-12 NOTE — Progress Notes (Signed)
Subjective:    Patient ID: Michele Martin, female    DOB: 1946-07-12, 68 y.o.   MRN: [id number]  HPI  Here with 1 wk onset feverish, prod cough, mild sob/wheezing, Pt denies chest pain, orthopnea, PND, increased LE swelling, palpitations, dizziness or syncope.  Hoarseness is worse as well.  Also with several months ongoing bilat hernia pain per pt, feels a painful pop when occurs, feels a hard area, no pain now, declines sugury referral for now given her lungs today. Denies worsening depressive symptoms, suicidal ideation, or panic Past Medical History  Diagnosis Date  . HYPERLIPIDEMIA 09/19/2009  . ANXIETY 04/15/2010  . DEPRESSION 09/19/2009  . Acute angle-closure glaucoma 02/14/2010  . ALLERGIC RHINITIS 09/19/2009  . CHRONIC OBSTRUCTIVE PULMONARY DISEASE, ACUTE EXACERBATION 12/19/2009  . ASTHMA 09/19/2009  . COPD 06/27/2009  . BACK PAIN 02/14/2010  . TREMOR 02/14/2010  . HOARSENESS 10/17/2009  . Wheezing 06/27/2009  . MENOPAUSAL DISORDER 08/20/2010  . TINNITUS 09/10/2010  . URI 09/10/2010  . OSTEOPENIA 09/10/2010  . Chronic bronchitis 04/14/2011  . Rotator cuff tear 06/23/2011  . Tremor 07/30/2011  . Arthritis   . Complication of anesthesia   . Osteoporosis, unspecified 04/20/2014   Past Surgical History  Procedure Laterality Date  . Normal heart cath  2001    Dr. Johnsie Cancel  . Abdominal hysterectomy    . S/p laser tx for glaucoma      no vision loss  . Cervical fusion 2013 - dr Vertell Limber      reports that she quit smoking about 2 years ago. Her smoking use included Cigarettes. She has a 40 pack-year smoking history. She has never used smokeless tobacco. She reports that she does not drink alcohol or use illicit drugs. family history includes Dementia in her mother. There is no history of Colon cancer. Allergies  Allergen Reactions  . Pravastatin     REACTION: itch and leg cramp  . Simvastatin     REACTION: itch and leg cramps  . Statins    Current Outpatient Prescriptions on File Prior  to Visit  Medication Sig Dispense Refill  . albuterol (PROVENTIL HFA) 108 (90 BASE) MCG/ACT inhaler Inhale 2 puffs into the lungs every 6 (six) hours as needed for wheezing or shortness of breath. 18 g 5  . albuterol (PROVENTIL) (2.5 MG/3ML) 0.083% nebulizer solution Take 3 mLs (2.5 mg total) by nebulization every 6 (six) hours as needed for wheezing or shortness of breath. 150 mL 1  . aspirin 81 MG EC tablet Take 81 mg by mouth daily.      [name] atorvastatin (LIPITOR) 20 MG tablet Take 10 mg by mouth daily.    . budesonide-formoterol (SYMBICORT) 160-4.5 MCG/ACT inhaler Inhale 2 puffs into the lungs 2 (two) times daily. 1 Inhaler 10  . clonazePAM (KLONOPIN) 0.5 MG tablet TAKE 1 TABLET BY MOUTH TWICE DAILY AS NEEDED FOR ANXIETY 15 tablet 0  . cyclobenzaprine (FLEXERIL) 5 MG tablet Take 1 tablet (5 mg total) by mouth 3 (three) times daily as needed for muscle spasms. 60 tablet 1  . escitalopram (LEXAPRO) 20 MG tablet Take 10 mg by mouth daily.    . fexofenadine (ALLEGRA) 180 MG tablet Take 180 mg by mouth daily.      [name] gabapentin (NEURONTIN) 100 MG capsule Take 100 mg by mouth 2 (two) times daily.    . montelukast (SINGULAIR) 10 MG tablet Take 1 tablet (10 mg total) by mouth at bedtime. 30 tablet 11  . oxyCODONE (OXY IR/ROXICODONE) 5  MG immediate release tablet Take 1 tablet (5 mg total) by mouth every 4 (four) hours as needed for severe pain. 60 tablet 0  . tapentadol (NUCYNTA) 50 MG TABS tablet Take 50 mg by mouth 2 (two) times daily.    [name] tiotropium (SPIRIVA HANDIHALER) 18 MCG inhalation capsule Place 1 capsule (18 mcg total) into inhaler and inhale daily. 90 capsule 3  . traMADol (ULTRAM) 50 MG tablet Take 0.5-1 tablets (25-50 mg total) by mouth every 6 (six) hours as needed. For pain 60 tablet 2   No current facility-administered medications on file prior to visit.    Review of Systems  Constitutional: Negative for unusual diaphoresis or other sweats  HENT: Negative for ringing in ear Eyes:  Negative for double vision or worsening visual disturbance.  Respiratory: Negative for choking and stridor.   Gastrointestinal: Negative for vomiting or other signifcant bowel change Genitourinary: Negative for hematuria or decreased urine volume.  Musculoskeletal: Negative for other MSK pain or swelling Skin: Negative for color change and worsening wound.  Neurological: Negative for tremors and numbness other than noted  Psychiatric/Behavioral: Negative for decreased concentration or agitation other than above       Objective:   Physical Exam BP 140/82 mmHg  Pulse 87  Temp(Src) 98.2 F (36.8 C) (Oral)  Ht 5' 1.5" (1.562 m)  Wt 116 lb 2 oz (52.674 kg)  BMI 21.59 kg/m2  SpO2 91% VS noted, mild ill Constitutional: Pt appears well-developed, well-nourished.  HENT: Head: NCAT.  Right Ear: External ear normal.  Left Ear: External ear normal.  Eyes: . Pupils are equal, round, and reactive to light. Conjunctivae and EOM are normal Bilat tm's with mild erythema.  Max sinus areas none tender.  Pharynx with mild erythema, no exudate Neck: Normal range of motion. Neck supple.  Cardiovascular: Normal rate and regular rhythm.   Pulmonary/Chest: Effort normal and breath sounds decreased with bilat wheezes.  Abd:  Soft, NT, ND, + BS - no hernia noted at this time Neurological: Pt is alert. Not confused , motor grossly intact Skin: Skin is warm. No rash Psychiatric: Pt behavior is normal. No agitation. not depressed affect No LE edema    Assessment & Plan:

## 2014-07-12 NOTE — Progress Notes (Signed)
Pre visit review using our clinic review tool, if applicable. No additional management support is needed unless otherwise documented below in the visit note. 

## 2014-07-31 ENCOUNTER — Other Ambulatory Visit (INDEPENDENT_AMBULATORY_CARE_PROVIDER_SITE_OTHER): Payer: Medicare Other

## 2014-07-31 DIAGNOSIS — Z0000 Encounter for general adult medical examination without abnormal findings: Secondary | ICD-10-CM

## 2014-07-31 LAB — BASIC METABOLIC PANEL
BUN: 23 mg/dL (ref 6–23)
CO2: 29 mEq/L (ref 19–32)
Calcium: 9.1 mg/dL (ref 8.4–10.5)
Chloride: 106 mEq/L (ref 96–112)
Creatinine, Ser: 0.5 mg/dL (ref 0.4–1.2)
GFR: 123.93 mL/min (ref >60.00)
Glucose, Bld: 88 mg/dL (ref 70–99)
POTASSIUM: 4 meq/L (ref 3.5–5.1)
Sodium: 144 mEq/L (ref 135–145)

## 2014-07-31 LAB — URINALYSIS, ROUTINE W REFLEX MICROSCOPIC
HGB URINE DIPSTICK: NEGATIVE
Ketones, ur: NEGATIVE
Nitrite: NEGATIVE
Specific Gravity, Urine: 1.025 (ref 1.000–1.030)
Total Protein, Urine: NEGATIVE
URINE GLUCOSE: NEGATIVE
UROBILINOGEN UA: 0.2 (ref 0.0–1.0)
pH: 5.5 (ref 5.0–8.0)

## 2014-07-31 LAB — CBC WITH DIFFERENTIAL/PLATELET
BASOS PCT: 0.7 % (ref 0.0–3.0)
Basophils Absolute: 0 10*3/uL (ref 0.0–0.1)
EOS PCT: 2.8 % (ref 0.0–5.0)
Eosinophils Absolute: 0.2 10*3/uL (ref 0.0–0.7)
HCT: 48.1 % — ABNORMAL HIGH (ref 36.0–46.0)
Hemoglobin: 15.7 g/dL — ABNORMAL HIGH (ref 12.0–15.0)
LYMPHS PCT: 25.9 % (ref 12.0–46.0)
Lymphs Abs: 1.9 10*3/uL (ref 0.7–4.0)
MCHC: 32.7 g/dL (ref 30.0–36.0)
MCV: 97.3 fl (ref 78.0–100.0)
MONOS PCT: 7.3 % (ref 3.0–12.0)
Monocytes Absolute: 0.5 10*3/uL (ref 0.1–1.0)
NEUTROS PCT: 63.3 % (ref 43.0–77.0)
Neutro Abs: 4.6 10*3/uL (ref 1.4–7.7)
PLATELETS: 191 10*3/uL (ref 150.0–400.0)
RBC: 4.95 Mil/uL (ref 3.87–5.11)
RDW: 13.4 % (ref 11.5–15.5)
WBC: 7.2 10*3/uL (ref 4.0–10.5)

## 2014-07-31 LAB — TSH: TSH: 1.59 u[IU]/mL (ref 0.35–4.50)

## 2014-07-31 LAB — LIPID PANEL
CHOL/HDL RATIO: 3
Cholesterol: 198 mg/dL (ref 0–200)
HDL: 67.3 mg/dL (ref >39.00)
LDL Cholesterol: 114 mg/dL — ABNORMAL HIGH (ref 0–99)
NONHDL: 130.7
Triglycerides: 86 mg/dL (ref 0.0–149.0)
VLDL: 17.2 mg/dL (ref 0.0–40.0)

## 2014-07-31 LAB — HEPATIC FUNCTION PANEL
ALK PHOS: 52 U/L (ref 39–117)
ALT: 16 U/L (ref 0–35)
AST: 20 U/L (ref 0–37)
Albumin: 3.8 g/dL (ref 3.5–5.2)
Bilirubin, Direct: 0.1 mg/dL (ref 0.0–0.3)
TOTAL PROTEIN: 6.5 g/dL (ref 6.0–8.3)
Total Bilirubin: 0.7 mg/dL (ref 0.2–1.2)

## 2014-08-01 DIAGNOSIS — M4316 Spondylolisthesis, lumbar region: Secondary | ICD-10-CM | POA: Diagnosis not present

## 2014-08-01 DIAGNOSIS — M5417 Radiculopathy, lumbosacral region: Secondary | ICD-10-CM | POA: Diagnosis not present

## 2014-08-02 ENCOUNTER — Encounter: Payer: Self-pay | Admitting: Internal Medicine

## 2014-08-02 ENCOUNTER — Ambulatory Visit (INDEPENDENT_AMBULATORY_CARE_PROVIDER_SITE_OTHER): Payer: Medicare Other | Admitting: Internal Medicine

## 2014-08-02 VITALS — BP 120/80 | HR 85 | Temp 98.3°F | Ht 62.0 in | Wt 115.2 lb

## 2014-08-02 DIAGNOSIS — J449 Chronic obstructive pulmonary disease, unspecified: Secondary | ICD-10-CM | POA: Diagnosis not present

## 2014-08-02 DIAGNOSIS — Z0000 Encounter for general adult medical examination without abnormal findings: Secondary | ICD-10-CM

## 2014-08-02 DIAGNOSIS — F17200 Nicotine dependence, unspecified, uncomplicated: Secondary | ICD-10-CM

## 2014-08-02 DIAGNOSIS — Z720 Tobacco use: Secondary | ICD-10-CM | POA: Diagnosis not present

## 2014-08-02 DIAGNOSIS — E785 Hyperlipidemia, unspecified: Secondary | ICD-10-CM | POA: Diagnosis not present

## 2014-08-02 DIAGNOSIS — J441 Chronic obstructive pulmonary disease with (acute) exacerbation: Secondary | ICD-10-CM

## 2014-08-02 MED ORDER — TIOTROPIUM BROMIDE MONOHYDRATE 18 MCG IN CAPS: 18.0000 ug | ORAL_CAPSULE | Freq: Every day | RESPIRATORY_TRACT | Status: DC

## 2014-08-02 NOTE — Progress Notes (Signed)
Pre visit review using our clinic review tool, if applicable. No additional management support is needed unless otherwise documented below in the visit note. 

## 2014-08-02 NOTE — Patient Instructions (Signed)
Please continue all other medications as before, and refills have been done if requested - the spiriva  Your EKG was OK today  Your lab work was OK today  Please have the pharmacy call with any other refills you may need.  Please continue your efforts at being more active, low cholesterol diet, and weight control.  You are otherwise up to date with prevention measures today.  Please keep your appointments with your specialists as you may have planned  You will be contacted regarding the referral for: CT scan chest for lung cancer screening  Please return in 6 months, or sooner if needed

## 2014-08-02 NOTE — Assessment & Plan Note (Signed)
stable overall by history and exam, recent data reviewed with pt, and pt to continue medical treatment as before,  to f/u any worsening symptoms or concerns SpO2 Readings from Last 3 Encounters:  08/02/14 94%  07/12/14 91%  06/07/14 92%

## 2014-08-02 NOTE — Assessment & Plan Note (Signed)

## 2014-08-02 NOTE — Assessment & Plan Note (Signed)
Also for LDCT for lung cancer screening

## 2014-08-02 NOTE — Progress Notes (Signed)
Subjective:    Patient ID: Michele Martin, female    DOB: 14-Mar-1946, 69 y.o.   MRN: [id number]  HPI  Here for wellness and f/u;  Overall doing ok;  Pt denies CP, worsening SOB, DOE, wheezing, orthopnea, PND, worsening LE edema, palpitations, dizziness or syncope.  Pt denies neurological change such as new headache, facial or extremity weakness.  Pt denies polydipsia, polyuria, or low sugar symptoms. Pt states overall good compliance with treatment and medications, good tolerability, and has been trying to follow lower cholesterol diet.  Pt denies worsening depressive symptoms, suicidal ideation or panic. No fever, night sweats, wt loss, loss of appetite, or other constitutional symptoms.  Pt states good ability with ADL's, has low fall risk, home safety reviewed and adequate, no other significant changes in hearing or vision, and only occasionally active with exercise, due to chornic pain. Now on lyrica per pain management yesterday in samples, off neurontin  Still smokes occasionally.Prior 1 ppd for 40 yrs.   For start low dose CT lung Ca screening;  Per CMS guidelines, I have determined the eligibility including patient age (34-80), and absence of any signs of lung cancer.  Specific calculation of number of pack years is documented.  Quit smoking years is documented.  Shared decision making engaged today, including a discussion of the benefits and harms of screening, discussion of need for followup with additional testing, risks of over-diagnosis, risk of false-positive screening examinations, and risk of radiation exposure.  Counseling done today on the importance of adherence to annual lunge cancer LDCT screening, importance of quit smoking and remaining quit, and tobacco cessation instructions given.  There is also discussion with patient regarding the impact of comorbidities, and patient states is able and willing to undergo diagnosis and treatment.   Past Medical History  Diagnosis Date  .  HYPERLIPIDEMIA 09/19/2009  . ANXIETY 04/15/2010  . DEPRESSION 09/19/2009  . Acute angle-closure glaucoma 02/14/2010  . ALLERGIC RHINITIS 09/19/2009  . CHRONIC OBSTRUCTIVE PULMONARY DISEASE, ACUTE EXACERBATION 12/19/2009  . ASTHMA 09/19/2009  . COPD 06/27/2009  . BACK PAIN 02/14/2010  . TREMOR 02/14/2010  . HOARSENESS 10/17/2009  . Wheezing 06/27/2009  . MENOPAUSAL DISORDER 08/20/2010  . TINNITUS 09/10/2010  . URI 09/10/2010  . OSTEOPENIA 09/10/2010  . Chronic bronchitis 04/14/2011  . Rotator cuff tear 06/23/2011  . Tremor 07/30/2011  . Arthritis   . Complication of anesthesia   . Osteoporosis, unspecified 04/20/2014   Past Surgical History  Procedure Laterality Date  . Normal heart cath  2001    Dr. Johnsie Cancel  . Abdominal hysterectomy    . S/p laser tx for glaucoma      no vision loss  . Cervical fusion 2013 - dr Vertell Limber      reports that she quit smoking about 2 years ago. Her smoking use included Cigarettes. She has a 40 pack-year smoking history. She has never used smokeless tobacco. She reports that she does not drink alcohol or use illicit drugs. family history includes Dementia in her mother. There is no history of Colon cancer. Allergies  Allergen Reactions  . Pravastatin     REACTION: itch and leg cramp  . Simvastatin     REACTION: itch and leg cramps  . Statins    Current Outpatient Prescriptions on File Prior to Visit  Medication Sig Dispense Refill  . albuterol (PROVENTIL HFA) 108 (90 BASE) MCG/ACT inhaler Inhale 2 puffs into the lungs every 6 (six) hours as needed for wheezing or shortness of  breath. 18 g 5  . albuterol (PROVENTIL) (2.5 MG/3ML) 0.083% nebulizer solution Take 3 mLs (2.5 mg total) by nebulization every 6 (six) hours as needed for wheezing or shortness of breath. 150 mL 1  . aspirin 81 MG EC tablet Take 81 mg by mouth daily.      . budesonide-formoterol (SYMBICORT) 160-4.5 MCG/ACT inhaler Inhale 2 puffs into the lungs 2 (two) times daily. 1 Inhaler 10  . clonazePAM  (KLONOPIN) 0.5 MG tablet TAKE 1 TABLET BY MOUTH TWICE DAILY AS NEEDED FOR ANXIETY 15 tablet 0  . cyclobenzaprine (FLEXERIL) 5 MG tablet Take 1 tablet (5 mg total) by mouth 3 (three) times daily as needed for muscle spasms. 60 tablet 1  . escitalopram (LEXAPRO) 20 MG tablet Take 10 mg by mouth daily.    . fexofenadine (ALLEGRA) 180 MG tablet Take 180 mg by mouth daily.      [name] gabapentin (NEURONTIN) 100 MG capsule Take 100 mg by mouth 2 (two) times daily.    [name] HYDROcodone-homatropine (HYCODAN) 5-1.5 MG/5ML syrup Take 5 mLs by mouth every 6 (six) hours as needed for cough. 180 mL 0  . levofloxacin (LEVAQUIN) 250 MG tablet Take 1 tablet (250 mg total) by mouth daily. 10 tablet 0  . montelukast (SINGULAIR) 10 MG tablet Take 1 tablet (10 mg total) by mouth at bedtime. 30 tablet 11  . oxyCODONE (OXY IR/ROXICODONE) 5 MG immediate release tablet Take 1 tablet (5 mg total) by mouth every 4 (four) hours as needed for severe pain. 60 tablet 0  . predniSONE (DELTASONE) 10 MG tablet 3 tabs by mouth per day for 3 days,2tabs per day for 3 days,1tab per day for 3 days 18 tablet 0  . tapentadol (NUCYNTA) 50 MG TABS tablet Take 50 mg by mouth 2 (two) times daily.    . traMADol (ULTRAM) 50 MG tablet Take 0.5-1 tablets (25-50 mg total) by mouth every 6 (six) hours as needed. For pain 60 tablet 2   No current facility-administered medications on file prior to visit.   Review of Systems Constitutional: Negative for increased diaphoresis, other activity, appetite or other siginficant weight change  HENT: Negative for worsening hearing loss, ear pain, facial swelling, mouth sores and neck stiffness.   Eyes: Negative for other worsening pain, redness or visual disturbance.  Respiratory: Negative for shortness of breath and wheezing.   Cardiovascular: Negative for chest pain and palpitations.  Gastrointestinal: Negative for diarrhea, blood in stool, abdominal distention or other pain Genitourinary: Negative for  hematuria, flank pain or change in urine volume.  Musculoskeletal: Negative for myalgias or other joint complaints.  Skin: Negative for color change and wound.  Neurological: Negative for syncope and numbness. other than noted Hematological: Negative for adenopathy. or other swelling Psychiatric/Behavioral: Negative for hallucinations, self-injury, decreased concentration or other worsening agitation.      Objective:   Physical Exam BP 120/80 mmHg  Pulse 85  Temp(Src) 98.3 F (36.8 C) (Oral)  Ht 5\' 2"  (3.016 m)  Wt 115 lb 4 oz (52.277 kg)  BMI 21.07 kg/m2  SpO2 94% VS noted,  Constitutional: Pt is oriented to person, place, and time. Appears well-developed and well-nourished.  Head: Normocephalic and atraumatic.  Right Ear: External ear normal.  Left Ear: External ear normal.  Nose: Nose normal.  Mouth/Throat: Oropharynx is clear and moist.  Eyes: Conjunctivae and EOM are normal. Pupils are equal, round, and reactive to light.  Neck: Normal range of motion. Neck supple. No JVD present. No tracheal deviation  present.  Cardiovascular: Normal rate, regular rhythm, normal heart sounds and intact distal pulses.   Pulmonary/Chest: Effort normal and decresaed breath sounds without rales or wheezing  Abdominal: Soft. Bowel sounds are normal. NT. No HSM  Musculoskeletal: Normal range of motion. Exhibits no edema.  Lymphadenopathy:  Has no cervical adenopathy.  Neurological: Pt is alert and oriented to person, place, and time. Pt has normal reflexes. No cranial nerve deficit. Motor grossly intact Skin: Skin is warm and dry. No rash noted.  Psychiatric:  Has normal mood and affect. Behavior is normal.     Assessment & Plan:

## 2014-08-16 ENCOUNTER — Telehealth: Payer: Self-pay

## 2014-08-16 MED ORDER — ATORVASTATIN CALCIUM 20 MG PO TABS: 20.0000 mg | ORAL_TABLET | Freq: Every day | ORAL | Status: DC

## 2014-08-16 NOTE — Telephone Encounter (Signed)
Refill request for Atorvastatin 20 mg tab #90, but not on current list.

## 2014-08-16 NOTE — Telephone Encounter (Signed)
Ok to try again, though has had difficulty with statins before - done erx

## 2014-08-23 ENCOUNTER — Ambulatory Visit (INDEPENDENT_AMBULATORY_CARE_PROVIDER_SITE_OTHER): Payer: Medicare Other | Admitting: Internal Medicine

## 2014-08-23 ENCOUNTER — Encounter: Payer: Self-pay | Admitting: Internal Medicine

## 2014-08-23 VITALS — BP 108/62 | HR 94 | Temp 98.7°F | Ht 61.0 in | Wt 111.2 lb

## 2014-08-23 DIAGNOSIS — J441 Chronic obstructive pulmonary disease with (acute) exacerbation: Secondary | ICD-10-CM

## 2014-08-23 DIAGNOSIS — J209 Acute bronchitis, unspecified: Secondary | ICD-10-CM | POA: Diagnosis not present

## 2014-08-23 MED ORDER — METHYLPREDNISOLONE ACETATE 80 MG/ML IJ SUSP
80.0000 mg | Freq: Once | INTRAMUSCULAR | Status: AC
  Administered 2014-08-23: 80 mg via INTRAMUSCULAR

## 2014-08-23 MED ORDER — LEVOFLOXACIN 250 MG PO TABS: 250.0000 mg | ORAL_TABLET | Freq: Every day | ORAL | Status: DC

## 2014-08-23 MED ORDER — PREDNISONE 10 MG PO TABS: ORAL_TABLET | ORAL | Status: DC

## 2014-08-23 MED ORDER — CEFTRIAXONE SODIUM 1 G IJ SOLR
1.0000 g | Freq: Once | INTRAMUSCULAR | Status: AC
  Administered 2014-08-23: 1 g via INTRAMUSCULAR

## 2014-08-23 NOTE — Patient Instructions (Signed)
You had the steroid and antibiotic shot today  Please take all new medication as prescribed - the antibiotic, and prednisone  Please continue all other medications as before, and refills have been done if requested.  Please have the pharmacy call with any other refills you may need.  Please keep your appointments with your specialists as you may have planned

## 2014-08-23 NOTE — Assessment & Plan Note (Addendum)
Mild to mod, for antibx course,  to f/u any worsening symptoms or concerns, cant r/o pna - for rocephin IM as well ; Declines cxr

## 2014-08-23 NOTE — Progress Notes (Signed)
Pre visit review using our clinic review tool, if applicable. No additional management support is needed unless otherwise documented below in the visit note. 

## 2014-08-24 DIAGNOSIS — J449 Chronic obstructive pulmonary disease, unspecified: Secondary | ICD-10-CM | POA: Diagnosis not present

## 2014-08-24 DIAGNOSIS — R0902 Hypoxemia: Secondary | ICD-10-CM | POA: Diagnosis not present

## 2014-08-26 NOTE — Assessment & Plan Note (Signed)
/  Mild to mod, for predpac asd,  to f/u any worsening symptoms or concerns 

## 2014-08-26 NOTE — Progress Notes (Signed)
Subjective:    Patient ID: Michele Martin, female    DOB: Mar 27, 1946, 69 y.o.   MRN: [id number]  HPI  Here with acute onset mild to mod 2-3 days ST, HA, general weakness and malaise, with prod cough greenish sputum, but Pt denies chest pain, increased sob or doe, wheezing, orthopnea, PND, increased LE swelling, palpitations, dizziness or syncope, except for worsening mild sob/wheezing since last PM.   Pt denies polydipsia, polyuria.  Pt denies new neurological symptoms such as new headache, or facial or extremity weakness or numbness Past Medical History  Diagnosis Date  . HYPERLIPIDEMIA 09/19/2009  . ANXIETY 04/15/2010  . DEPRESSION 09/19/2009  . Acute angle-closure glaucoma 02/14/2010  . ALLERGIC RHINITIS 09/19/2009  . CHRONIC OBSTRUCTIVE PULMONARY DISEASE, ACUTE EXACERBATION 12/19/2009  . ASTHMA 09/19/2009  . COPD 06/27/2009  . BACK PAIN 02/14/2010  . TREMOR 02/14/2010  . HOARSENESS 10/17/2009  . Wheezing 06/27/2009  . MENOPAUSAL DISORDER 08/20/2010  . TINNITUS 09/10/2010  . URI 09/10/2010  . OSTEOPENIA 09/10/2010  . Chronic bronchitis 04/14/2011  . Rotator cuff tear 06/23/2011  . Tremor 07/30/2011  . Arthritis   . Complication of anesthesia   . Osteoporosis, unspecified 04/20/2014   Past Surgical History  Procedure Laterality Date  . Normal heart cath  2001    Dr. Johnsie Cancel  . Abdominal hysterectomy    . S/p laser tx for glaucoma      no vision loss  . Cervical fusion 2013 - dr Vertell Limber      reports that she quit smoking about 3 years ago. Her smoking use included Cigarettes. She has a 40 pack-year smoking history. She has never used smokeless tobacco. She reports that she does not drink alcohol or use illicit drugs. family history includes Dementia in her mother. There is no history of Colon cancer. Allergies  Allergen Reactions  . Pravastatin     REACTION: itch and leg cramp  . Simvastatin     REACTION: itch and leg cramps  . Statins    Current Outpatient Prescriptions on File  Prior to Visit  Medication Sig Dispense Refill  . albuterol (PROVENTIL HFA) 108 (90 BASE) MCG/ACT inhaler Inhale 2 puffs into the lungs every 6 (six) hours as needed for wheezing or shortness of breath. 18 g 5  . albuterol (PROVENTIL) (2.5 MG/3ML) 0.083% nebulizer solution Take 3 mLs (2.5 mg total) by nebulization every 6 (six) hours as needed for wheezing or shortness of breath. 150 mL 1  . aspirin 81 MG EC tablet Take 81 mg by mouth daily.      [name] atorvastatin (LIPITOR) 20 MG tablet Take 1 tablet (20 mg total) by mouth daily. 90 tablet 3  . budesonide-formoterol (SYMBICORT) 160-4.5 MCG/ACT inhaler Inhale 2 puffs into the lungs 2 (two) times daily. 1 Inhaler 10  . clonazePAM (KLONOPIN) 0.5 MG tablet TAKE 1 TABLET BY MOUTH TWICE DAILY AS NEEDED FOR ANXIETY 15 tablet 0  . cyclobenzaprine (FLEXERIL) 5 MG tablet Take 1 tablet (5 mg total) by mouth 3 (three) times daily as needed for muscle spasms. 60 tablet 1  . escitalopram (LEXAPRO) 20 MG tablet Take 10 mg by mouth daily.    . fexofenadine (ALLEGRA) 180 MG tablet Take 180 mg by mouth daily.      [name] gabapentin (NEURONTIN) 100 MG capsule Take 100 mg by mouth 2 (two) times daily.    . montelukast (SINGULAIR) 10 MG tablet Take 1 tablet (10 mg total) by mouth at bedtime. 30 tablet 11  .  oxyCODONE (OXY IR/ROXICODONE) 5 MG immediate release tablet Take 1 tablet (5 mg total) by mouth every 4 (four) hours as needed for severe pain. 60 tablet 0  . tapentadol (NUCYNTA) 50 MG TABS tablet Take 50 mg by mouth 2 (two) times daily.    [name] tiotropium (SPIRIVA HANDIHALER) 18 MCG inhalation capsule Place 1 capsule (18 mcg total) into inhaler and inhale daily. 90 capsule 3  . traMADol (ULTRAM) 50 MG tablet Take 0.5-1 tablets (25-50 mg total) by mouth every 6 (six) hours as needed. For pain 60 tablet 2   No current facility-administered medications on file prior to visit.   Review of Systems  All otherwise neg per pt    Objective:   Physical Exam BP 108/62 mmHg   Pulse 94  Temp(Src) 98.7 F (37.1 C) (Oral)  Ht 5\' 1"  (1.549 m)  Wt 111 lb 4 oz (50.463 kg)  BMI 21.03 kg/m2  SpO2 91% VS noted, mild ill  Constitutional: Pt appears well-developed, well-nourished.  HENT: Head: NCAT.  Right Ear: External ear normal.  Left Ear: External ear normal.  Eyes: . Pupils are equal, round, and reactive to light. Conjunctivae and EOM are normal Bilat tm's with mild erythema.  Max sinus areas non tender.  Pharynx with mild erythema, no exudate Neck: Normal range of motion. Neck supple.  Cardiovascular: Normal rate and regular rhythm.   Pulmonary/Chest: Effort normal and breath sounds with few LLL rales but with mild bilat wheezing.  Neurological: Pt is alert. Not confused , motor grossly intact Skin: Skin is warm. No rash Psychiatric: Pt behavior is normal. No agitation.     Assessment & Plan:

## 2014-09-04 ENCOUNTER — Ambulatory Visit (INDEPENDENT_AMBULATORY_CARE_PROVIDER_SITE_OTHER)
Admission: RE | Admit: 2014-09-04 | Discharge: 2014-09-04 | Disposition: A | Discharge status: Home / Self Care | Payer: Medicare Other | Source: Ambulatory Visit | Attending: Internal Medicine | Admitting: Internal Medicine

## 2014-09-04 DIAGNOSIS — Z720 Tobacco use: Secondary | ICD-10-CM | POA: Diagnosis not present

## 2014-09-04 DIAGNOSIS — F17200 Nicotine dependence, unspecified, uncomplicated: Secondary | ICD-10-CM

## 2014-09-04 DIAGNOSIS — F17210 Nicotine dependence, cigarettes, uncomplicated: Secondary | ICD-10-CM | POA: Diagnosis not present

## 2014-09-04 DIAGNOSIS — Z122 Encounter for screening for malignant neoplasm of respiratory organs: Secondary | ICD-10-CM | POA: Diagnosis not present

## 2014-09-04 DIAGNOSIS — J449 Chronic obstructive pulmonary disease, unspecified: Secondary | ICD-10-CM

## 2014-09-24 DIAGNOSIS — R0902 Hypoxemia: Secondary | ICD-10-CM | POA: Diagnosis not present

## 2014-09-24 DIAGNOSIS — J449 Chronic obstructive pulmonary disease, unspecified: Secondary | ICD-10-CM | POA: Diagnosis not present

## 2014-09-25 DIAGNOSIS — M5417 Radiculopathy, lumbosacral region: Secondary | ICD-10-CM | POA: Diagnosis not present

## 2014-09-25 DIAGNOSIS — M4316 Spondylolisthesis, lumbar region: Secondary | ICD-10-CM | POA: Diagnosis not present

## 2014-10-11 ENCOUNTER — Ambulatory Visit (INDEPENDENT_AMBULATORY_CARE_PROVIDER_SITE_OTHER): Payer: Medicare Other | Admitting: Internal Medicine

## 2014-10-11 ENCOUNTER — Other Ambulatory Visit (INDEPENDENT_AMBULATORY_CARE_PROVIDER_SITE_OTHER): Payer: Medicare Other

## 2014-10-11 VITALS — BP 118/62 | HR 105 | Temp 98.1°F | Resp 22 | Ht 62.0 in | Wt 108.0 lb

## 2014-10-11 DIAGNOSIS — G894 Chronic pain syndrome: Secondary | ICD-10-CM

## 2014-10-11 DIAGNOSIS — J441 Chronic obstructive pulmonary disease with (acute) exacerbation: Secondary | ICD-10-CM

## 2014-10-11 DIAGNOSIS — M5431 Sciatica, right side: Secondary | ICD-10-CM | POA: Diagnosis not present

## 2014-10-11 LAB — URINALYSIS, ROUTINE W REFLEX MICROSCOPIC
HGB URINE DIPSTICK: NEGATIVE
Leukocytes, UA: NEGATIVE
Nitrite: NEGATIVE
PH: 5.5 (ref 5.0–8.0)
Specific Gravity, Urine: 1.025 (ref 1.000–1.030)
TOTAL PROTEIN, URINE-UPE24: 30 — AB
URINE GLUCOSE: NEGATIVE
Urobilinogen, UA: 0.2 (ref 0.0–1.0)

## 2014-10-11 MED ORDER — LEVOFLOXACIN 250 MG PO TABS: 250.0000 mg | ORAL_TABLET | Freq: Every day | ORAL | Status: DC

## 2014-10-11 MED ORDER — ALBUTEROL SULFATE (2.5 MG/3ML) 0.083% IN NEBU: 2.5000 mg | INHALATION_SOLUTION | Freq: Four times a day (QID) | RESPIRATORY_TRACT | Status: DC | PRN

## 2014-10-11 MED ORDER — PREDNISONE 10 MG PO TABS: ORAL_TABLET | ORAL | Status: DC

## 2014-10-11 MED ORDER — ALBUTEROL SULFATE HFA 108 (90 BASE) MCG/ACT IN AERS: 2.0000 | INHALATION_SPRAY | Freq: Four times a day (QID) | RESPIRATORY_TRACT | Status: DC | PRN

## 2014-10-11 MED ORDER — METHYLPREDNISOLONE ACETATE 80 MG/ML IJ SUSP
80.0000 mg | Freq: Once | INTRAMUSCULAR | Status: AC
  Administered 2014-10-11: 80 mg via INTRAMUSCULAR

## 2014-10-11 MED ORDER — MONTELUKAST SODIUM 10 MG PO TABS: 10.0000 mg | ORAL_TABLET | Freq: Every day | ORAL | Status: DC

## 2014-10-11 MED ORDER — ONDANSETRON HCL 4 MG PO TABS: 4.0000 mg | ORAL_TABLET | Freq: Three times a day (TID) | ORAL | Status: DC | PRN

## 2014-10-11 NOTE — Patient Instructions (Signed)
You had the steroid shot today  Please take all new medication as prescribed - the antibiotic, prednisone, and nausea medication if needed  Please continue all other medications as before, and refills have been done if requested - the singulair and neb treatments  Please have the pharmacy call with any other refills you may need.  Please keep your appointments with your specialists as you may have planned  Please go to the LAB in the Basement (turn left off the elevator) for the tests to be done today - just the urine testing today  You will be contacted by phone if any changes need to be made immediately.  Otherwise, you will receive a letter about your results with an explanation, but please check with MyChart first.

## 2014-10-11 NOTE — Progress Notes (Signed)
Pre visit review using our clinic review tool, if applicable. No additional management support is needed unless otherwise documented below in the visit note. 

## 2014-10-11 NOTE — Assessment & Plan Note (Addendum)
Mild, for antibiotic, prednisone after depomedrol ln office, declines cxr today  SpO2 Readings from Last 3 Encounters:  10/11/14 90%  08/23/14 91%  08/02/14 94%

## 2014-10-11 NOTE — Assessment & Plan Note (Signed)
On several pain meds, for steroid tx as above, neuro exam no change, hold on MRI at this time. Would be poor candidatae for intervention surgury

## 2014-10-11 NOTE — Progress Notes (Signed)
Subjective:    Patient ID: Michele Martin, female    DOB: 29-Dec-1945, 69 y.o.   MRN: [id number]  HPI  Here with 3 days onset mild worsening sob/doe, scant prod cough despite increasing her mucinex, and wheezing/tightness. Pt denies chest pain, increased sob or doe, wheezing, orthopnea, PND, increased LE swelling, palpitations, dizziness or syncope except for the above.  Assoc with nausea and several loose stools, no blood.  Sees pain clinic, c/o some goofiness with nucynta., has been taking ibuprofen 4 tabs in am instead, Denies worsening reflux, abd pain, dysphagia, vomiting or blood.  Pt continues to have recurring LBP without change in severity, bowel or bladder change, fever, wt loss,  worsening LE pain/numbness/weakness, gait change or falls; except has has right LBP with some radiation mild to distal RLE intermitently for several wks. Past Medical History  Diagnosis Date  . HYPERLIPIDEMIA 09/19/2009  . ANXIETY 04/15/2010  . DEPRESSION 09/19/2009  . Acute angle-closure glaucoma 02/14/2010  . ALLERGIC RHINITIS 09/19/2009  . CHRONIC OBSTRUCTIVE PULMONARY DISEASE, ACUTE EXACERBATION 12/19/2009  . ASTHMA 09/19/2009  . COPD 06/27/2009  . BACK PAIN 02/14/2010  . TREMOR 02/14/2010  . HOARSENESS 10/17/2009  . Wheezing 06/27/2009  . MENOPAUSAL DISORDER 08/20/2010  . TINNITUS 09/10/2010  . URI 09/10/2010  . OSTEOPENIA 09/10/2010  . Chronic bronchitis 04/14/2011  . Rotator cuff tear 06/23/2011  . Tremor 07/30/2011  . Arthritis   . Complication of anesthesia   . Osteoporosis, unspecified 04/20/2014   Past Surgical History  Procedure Laterality Date  . Normal heart cath  2001    Dr. Johnsie Cancel  . Abdominal hysterectomy    . S/p laser tx for glaucoma      no vision loss  . Cervical fusion 2013 - dr Vertell Limber      reports that she quit smoking about 3 years ago. Her smoking use included Cigarettes. She has a 40 pack-year smoking history. She has never used smokeless tobacco. She reports that she does not  drink alcohol or use illicit drugs. family history includes Dementia in her mother. There is no history of Colon cancer. Allergies  Allergen Reactions  . Pravastatin     REACTION: itch and leg cramp  . Simvastatin     REACTION: itch and leg cramps  . Statins    Current Outpatient Prescriptions on File Prior to Visit  Medication Sig Dispense Refill  . albuterol (PROVENTIL HFA) 108 (90 BASE) MCG/ACT inhaler Inhale 2 puffs into the lungs every 6 (six) hours as needed for wheezing or shortness of breath. 18 g 5  . albuterol (PROVENTIL) (2.5 MG/3ML) 0.083% nebulizer solution Take 3 mLs (2.5 mg total) by nebulization every 6 (six) hours as needed for wheezing or shortness of breath. 150 mL 1  . aspirin 81 MG EC tablet Take 81 mg by mouth daily.      [name] atorvastatin (LIPITOR) 20 MG tablet Take 1 tablet (20 mg total) by mouth daily. 90 tablet 3  . budesonide-formoterol (SYMBICORT) 160-4.5 MCG/ACT inhaler Inhale 2 puffs into the lungs 2 (two) times daily. 1 Inhaler 10  . clonazePAM (KLONOPIN) 0.5 MG tablet TAKE 1 TABLET BY MOUTH TWICE DAILY AS NEEDED FOR ANXIETY 15 tablet 0  . cyclobenzaprine (FLEXERIL) 5 MG tablet Take 1 tablet (5 mg total) by mouth 3 (three) times daily as needed for muscle spasms. 60 tablet 1  . escitalopram (LEXAPRO) 20 MG tablet Take 10 mg by mouth daily.    . fexofenadine (ALLEGRA) 180 MG tablet Take 180  mg by mouth daily.      [name] gabapentin (NEURONTIN) 100 MG capsule Take 100 mg by mouth 2 (two) times daily.    . montelukast (SINGULAIR) 10 MG tablet Take 1 tablet (10 mg total) by mouth at bedtime. 30 tablet 11  . tapentadol (NUCYNTA) 50 MG TABS tablet Take 50 mg by mouth 2 (two) times daily.    [name] tiotropium (SPIRIVA HANDIHALER) 18 MCG inhalation capsule Place 1 capsule (18 mcg total) into inhaler and inhale daily. 90 capsule 3  . traMADol (ULTRAM) 50 MG tablet Take 0.5-1 tablets (25-50 mg total) by mouth every 6 (six) hours as needed. For pain 60 tablet 2   No current  facility-administered medications on file prior to visit.    Review of Systems  Constitutional: Negative for unusual diaphoresis or night sweats HENT: Negative for ringing in ear or discharge Eyes: Negative for double vision or worsening visual disturbance.  Respiratory: Negative for choking and stridor.   Gastrointestinal: Negative for vomiting or other signifcant bowel change Genitourinary: Negative for hematuria or change in urine volume.  Musculoskeletal: Negative for other MSK pain or swelling Skin: Negative for color change and worsening wound.  Neurological: Negative for tremors and numbness other than noted  Psychiatric/Behavioral: Negative for decreased concentration or agitation other than above       Objective:   Physical Exam BP 118/62 mmHg  Pulse 105  Temp(Src) 98.1 F (36.7 C) (Oral)  Resp 22  Ht 5\' 2"  (1.575 m)  Wt 108 lb (48.988 kg)  BMI 19.75 kg/m2  SpO2 90% VS noted,  Constitutional: Pt appears in no significant distress HENT: Head: NCAT.  Right Ear: External ear normal.  Left Ear: External ear normal.  Eyes: . Pupils are equal, round, and reactive to light. Conjunctivae and EOM are normal Neck: Normal range of motion. Neck supple.  Cardiovascular: Normal rate and regular rhythm.   Pulmonary/Chest: Effort normal and breath sounds decreased without rales but mild bilat wheezes Abd:  Soft, NT, ND, + BS Neurological: Pt is alert. Not confused , motor 5/5 intact Skin: Skin is warm. No rash, no LE edema Psychiatric: Pt behavior is normal. No agitation.      Assessment & Plan:

## 2014-10-11 NOTE — Assessment & Plan Note (Signed)
Encouraged to fu with pain management regarding confusion and nucynta, I would prefer she not do the ibuprofen prn due to risk of GI and renal side effects

## 2014-10-23 DIAGNOSIS — J449 Chronic obstructive pulmonary disease, unspecified: Secondary | ICD-10-CM | POA: Diagnosis not present

## 2014-10-23 DIAGNOSIS — R0902 Hypoxemia: Secondary | ICD-10-CM | POA: Diagnosis not present

## 2014-10-25 ENCOUNTER — Ambulatory Visit (INDEPENDENT_AMBULATORY_CARE_PROVIDER_SITE_OTHER): Payer: Medicare Other | Admitting: Internal Medicine

## 2014-10-25 VITALS — BP 116/60 | HR 94 | Temp 98.0°F | Resp 16 | Ht 62.0 in | Wt 107.1 lb

## 2014-10-25 DIAGNOSIS — F329 Major depressive disorder, single episode, unspecified: Secondary | ICD-10-CM

## 2014-10-25 DIAGNOSIS — J42 Unspecified chronic bronchitis: Secondary | ICD-10-CM

## 2014-10-25 DIAGNOSIS — R079 Chest pain, unspecified: Secondary | ICD-10-CM | POA: Insufficient documentation

## 2014-10-25 DIAGNOSIS — F32A Depression, unspecified: Secondary | ICD-10-CM

## 2014-10-25 MED ORDER — NAPROXEN 500 MG PO TABS: 500.0000 mg | ORAL_TABLET | Freq: Two times a day (BID) | ORAL | Status: DC

## 2014-10-25 NOTE — Progress Notes (Signed)
Subjective:    Patient ID: Michele Martin, female    DOB: 07-Dec-1945, 69 y.o.   MRN: [id number]  HPI  Here to fu, c/o bilat costal margin pain and mid SSCP , mod to severe, worse to cough and pleuritic , better to lie down, constant persistent x 2 wks, just not getting better, although Pt denies other chest pain, increased sob or doe, wheezing, orthopnea, PND, increased LE swelling, palpitations, dizziness or syncope. No fever, Pt denies new neurological symptoms such as new headache, or facial or extremity weakness or numbness  No recent falls.Denies worsening depressive symptoms, suicidal ideation, or panic Past Medical History  Diagnosis Date  . HYPERLIPIDEMIA 09/19/2009  . ANXIETY 04/15/2010  . DEPRESSION 09/19/2009  . Acute angle-closure glaucoma 02/14/2010  . ALLERGIC RHINITIS 09/19/2009  . CHRONIC OBSTRUCTIVE PULMONARY DISEASE, ACUTE EXACERBATION 12/19/2009  . ASTHMA 09/19/2009  . COPD 06/27/2009  . BACK PAIN 02/14/2010  . TREMOR 02/14/2010  . HOARSENESS 10/17/2009  . Wheezing 06/27/2009  . MENOPAUSAL DISORDER 08/20/2010  . TINNITUS 09/10/2010  . URI 09/10/2010  . OSTEOPENIA 09/10/2010  . Chronic bronchitis 04/14/2011  . Rotator cuff tear 06/23/2011  . Tremor 07/30/2011  . Arthritis   . Complication of anesthesia   . Osteoporosis, unspecified 04/20/2014   Past Surgical History  Procedure Laterality Date  . Normal heart cath  2001    Dr. Johnsie Cancel  . Abdominal hysterectomy    . S/p laser tx for glaucoma      no vision loss  . Cervical fusion 2013 - dr Vertell Limber      reports that she quit smoking about 3 years ago. Her smoking use included Cigarettes. She has a 40 pack-year smoking history. She has never used smokeless tobacco. She reports that she does not drink alcohol or use illicit drugs. family history includes Dementia in her mother. There is no history of Colon cancer. Allergies  Allergen Reactions  . Pravastatin     REACTION: itch and leg cramp  . Simvastatin     REACTION: itch  and leg cramps  . Statins    Current Outpatient Prescriptions on File Prior to Visit  Medication Sig Dispense Refill  . albuterol (PROVENTIL HFA) 108 (90 BASE) MCG/ACT inhaler Inhale 2 puffs into the lungs every 6 (six) hours as needed for wheezing or shortness of breath. 18 g 5  . albuterol (PROVENTIL) (2.5 MG/3ML) 0.083% nebulizer solution Take 3 mLs (2.5 mg total) by nebulization every 6 (six) hours as needed for wheezing or shortness of breath. 150 mL 5  . aspirin 81 MG EC tablet Take 81 mg by mouth daily.      [name] atorvastatin (LIPITOR) 20 MG tablet Take 1 tablet (20 mg total) by mouth daily. 90 tablet 3  . budesonide-formoterol (SYMBICORT) 160-4.5 MCG/ACT inhaler Inhale 2 puffs into the lungs 2 (two) times daily. 1 Inhaler 10  . clonazePAM (KLONOPIN) 0.5 MG tablet TAKE 1 TABLET BY MOUTH TWICE DAILY AS NEEDED FOR ANXIETY 15 tablet 0  . cyclobenzaprine (FLEXERIL) 5 MG tablet Take 1 tablet (5 mg total) by mouth 3 (three) times daily as needed for muscle spasms. 60 tablet 1  . escitalopram (LEXAPRO) 20 MG tablet Take 10 mg by mouth daily.    . fexofenadine (ALLEGRA) 180 MG tablet Take 180 mg by mouth daily.      [name] gabapentin (NEURONTIN) 100 MG capsule Take 100 mg by mouth 2 (two) times daily.    [name] levofloxacin (LEVAQUIN) 250 MG tablet Take 1  tablet (250 mg total) by mouth daily. 10 tablet 0  . montelukast (SINGULAIR) 10 MG tablet Take 1 tablet (10 mg total) by mouth at bedtime. 30 tablet 11  . ondansetron (ZOFRAN) 4 MG tablet Take 1 tablet (4 mg total) by mouth every 8 (eight) hours as needed for nausea or vomiting. 30 tablet 0  . predniSONE (DELTASONE) 10 MG tablet 3 tabs by mouth per day for 3 days,2tabs per day for 3 days,1tab per day for 3 days 18 tablet 0  . tapentadol (NUCYNTA) 50 MG TABS tablet Take 50 mg by mouth 2 (two) times daily.    [name] tiotropium (SPIRIVA HANDIHALER) 18 MCG inhalation capsule Place 1 capsule (18 mcg total) into inhaler and inhale daily. 90 capsule 3  . traMADol  (ULTRAM) 50 MG tablet Take 0.5-1 tablets (25-50 mg total) by mouth every 6 (six) hours as needed. For pain 60 tablet 2   No current facility-administered medications on file prior to visit.   Review of Systems  Constitutional: Negative for unusual diaphoresis or night sweats HENT: Negative for ringing in ear or discharge Eyes: Negative for double vision or worsening visual disturbance.  Respiratory: Negative for choking and stridor.   Gastrointestinal: Negative for vomiting or other signifcant bowel change Genitourinary: Negative for hematuria or change in urine volume.  Musculoskeletal: Negative for other MSK pain or swelling Skin: Negative for color change and worsening wound.  Neurological: Negative for tremors and numbness other than noted  Psychiatric/Behavioral: Negative for decreased concentration or agitation other than above       Objective:   Physical Exam BP 116/60 mmHg  Pulse 94  Temp(Src) 98 F (36.7 C) (Oral)  Resp 16  Ht 5\' 2"  (1.575 m)  Wt 107 lb 2.2 oz (48.598 kg)  BMI 19.59 kg/m2  SpO2 94% VS noted,  Constitutional: Pt appears in no significant distress HENT: Head: NCAT.  Right Ear: External ear normal.  Left Ear: External ear normal.  Eyes: . Pupils are equal, round, and reactive to light. Conjunctivae and EOM are normal Neck: Normal range of motion. Neck supple.  Cardiovascular: Normal rate and regular rhythm.   Pulmonary/Chest: Effort normal and breath sounds without rales or wheezing.  Abd:  Soft, NT, ND, + BS except for tender bilat costal margins anterolat chest without rash, erythema, swelling Neurological: Pt is alert. Not confused , motor grossly intact Skin: Skin is warm. No rash, no LE edema Psychiatric: Pt behavior is normal. No agitation. not depressed affect    Assessment & Plan:

## 2014-10-25 NOTE — Assessment & Plan Note (Signed)
stable overall by history and exam, recent data reviewed with pt, and pt to continue medical treatment as before,  to f/u any worsening symptoms or concerns Lab Results  Component Value Date   WBC 7.2 07/31/2014   HGB 15.7* 07/31/2014   HCT 48.1* 07/31/2014   PLT 191.0 07/31/2014   GLUCOSE 88 07/31/2014   CHOL 198 07/31/2014   TRIG 86.0 07/31/2014   HDL 67.30 07/31/2014   LDLDIRECT 164.4 07/19/2013   LDLCALC 114* 07/31/2014   ALT 16 07/31/2014   AST 20 07/31/2014   NA 144 07/31/2014   K 4.0 07/31/2014   CL 106 07/31/2014   CREATININE 0.5 07/31/2014   BUN 23 07/31/2014   CO2 29 07/31/2014   TSH 1.59 07/31/2014   HGBA1C 5.6 08/01/2011

## 2014-10-25 NOTE — Assessment & Plan Note (Signed)
stable overall by history and exam, recent data reviewed with pt, and pt to continue medical treatment as before,  to f/u any worsening symptoms or concerns SpO2 Readings from Last 3 Encounters:  10/25/14 94%  10/11/14 90%  08/23/14 91%

## 2014-10-25 NOTE — Assessment & Plan Note (Signed)
ECG reviewed as per emr, suspect MSK pain, for naproxen prn limited rx,  to f/u any worsening symptoms or concerns , cough med prn

## 2014-10-25 NOTE — Patient Instructions (Signed)
Your EKG was OK today  Please take all new medication as prescribed - the anti-inflammatory  You can also take Delsym OTC for cough  Please continue all other medications as before, and refills have been done if requested.  Please have the pharmacy call with any other refills you may need.  Please keep your appointments with your specialists as you may have planned

## 2014-11-03 ENCOUNTER — Other Ambulatory Visit: Payer: Self-pay | Admitting: Internal Medicine

## 2014-11-03 NOTE — Telephone Encounter (Signed)
Done hardcopy to Cherina  

## 2014-11-03 NOTE — Telephone Encounter (Signed)
Done

## 2014-11-23 DIAGNOSIS — R0902 Hypoxemia: Secondary | ICD-10-CM | POA: Diagnosis not present

## 2014-11-23 DIAGNOSIS — J449 Chronic obstructive pulmonary disease, unspecified: Secondary | ICD-10-CM | POA: Diagnosis not present

## 2014-12-08 ENCOUNTER — Telehealth: Payer: Self-pay | Admitting: Internal Medicine

## 2014-12-08 NOTE — Telephone Encounter (Signed)
Pt called in and said that he is pass due for his Prolia injection.  Is she due and can we order it and sec her?

## 2014-12-08 NOTE — Telephone Encounter (Signed)
Forwarding msg to [name] to check with insurance...Michele Martin

## 2014-12-11 NOTE — Telephone Encounter (Signed)
I have electronically sent pt's info for Prolia insurance verification and will notify you once I have a response. Thank you. °

## 2014-12-18 DIAGNOSIS — M5417 Radiculopathy, lumbosacral region: Secondary | ICD-10-CM | POA: Diagnosis not present

## 2014-12-18 DIAGNOSIS — M4316 Spondylolisthesis, lumbar region: Secondary | ICD-10-CM | POA: Diagnosis not present

## 2014-12-19 NOTE — Telephone Encounter (Signed)
Pt return call back gave her [name] response below. Made appt for 12/26/14 to get prolia...Michele Martin

## 2014-12-19 NOTE — Telephone Encounter (Signed)
Called pt no answer LMOM RTC.../lmb 

## 2014-12-19 NOTE — Telephone Encounter (Signed)
I have rec'd pt's insurance verification for Prolia.  Pt will have an estimated responsibility of a 20% co-insurance for Prolia, which is approximately $180.  I have sent a copy of the summary of benefits to be scanned into her chart.   If pt cannot afford $180 for her injection, please advise her to contact [employer] at [phone] to see if she qualifies for one of their assistance programs.  If she qualifies they will instruct her how to proceed. Please let me know if you have any questions. Thank you.

## 2014-12-23 DIAGNOSIS — R0902 Hypoxemia: Secondary | ICD-10-CM | POA: Diagnosis not present

## 2014-12-23 DIAGNOSIS — J449 Chronic obstructive pulmonary disease, unspecified: Secondary | ICD-10-CM | POA: Diagnosis not present

## 2014-12-26 ENCOUNTER — Ambulatory Visit (INDEPENDENT_AMBULATORY_CARE_PROVIDER_SITE_OTHER): Payer: Medicare Other

## 2014-12-26 DIAGNOSIS — M810 Age-related osteoporosis without current pathological fracture: Secondary | ICD-10-CM

## 2014-12-26 MED ORDER — DENOSUMAB 60 MG/ML ~~LOC~~ SOLN
60.0000 mg | Freq: Once | SUBCUTANEOUS | Status: AC
  Administered 2014-12-26: 60 mg via SUBCUTANEOUS

## 2015-01-09 ENCOUNTER — Other Ambulatory Visit: Payer: Self-pay

## 2015-01-09 MED ORDER — ESCITALOPRAM OXALATE 20 MG PO TABS: 20.0000 mg | ORAL_TABLET | Freq: Every day | ORAL | Status: DC

## 2015-01-22 ENCOUNTER — Other Ambulatory Visit: Payer: Self-pay

## 2015-01-23 ENCOUNTER — Ambulatory Visit (INDEPENDENT_AMBULATORY_CARE_PROVIDER_SITE_OTHER): Payer: Medicare Other | Admitting: Internal Medicine

## 2015-01-23 ENCOUNTER — Encounter: Payer: Self-pay | Admitting: Internal Medicine

## 2015-01-23 ENCOUNTER — Ambulatory Visit (INDEPENDENT_AMBULATORY_CARE_PROVIDER_SITE_OTHER)
Admission: RE | Admit: 2015-01-23 | Discharge: 2015-01-23 | Disposition: A | Discharge status: Home / Self Care | Payer: Medicare Other | Source: Ambulatory Visit | Attending: Internal Medicine | Admitting: Internal Medicine

## 2015-01-23 VITALS — BP 128/64 | HR 81 | Temp 98.3°F | Ht 62.0 in | Wt 111.0 lb

## 2015-01-23 DIAGNOSIS — M25551 Pain in right hip: Secondary | ICD-10-CM

## 2015-01-23 DIAGNOSIS — R0902 Hypoxemia: Secondary | ICD-10-CM | POA: Diagnosis not present

## 2015-01-23 DIAGNOSIS — J441 Chronic obstructive pulmonary disease with (acute) exacerbation: Secondary | ICD-10-CM | POA: Diagnosis not present

## 2015-01-23 DIAGNOSIS — J449 Chronic obstructive pulmonary disease, unspecified: Secondary | ICD-10-CM | POA: Diagnosis not present

## 2015-01-23 MED ORDER — LEVOFLOXACIN 250 MG PO TABS: 250.0000 mg | ORAL_TABLET | Freq: Every day | ORAL | Status: DC

## 2015-01-23 MED ORDER — BUDESONIDE-FORMOTEROL FUMARATE 160-4.5 MCG/ACT IN AERO: 2.0000 | INHALATION_SPRAY | Freq: Two times a day (BID) | RESPIRATORY_TRACT | Status: DC

## 2015-01-23 MED ORDER — HYDROCODONE-HOMATROPINE 5-1.5 MG/5ML PO SYRP: 5.0000 mL | ORAL_SOLUTION | Freq: Four times a day (QID) | ORAL | Status: DC | PRN

## 2015-01-23 MED ORDER — PREDNISONE 10 MG PO TABS: ORAL_TABLET | ORAL | Status: DC

## 2015-01-23 MED ORDER — METHYLPREDNISOLONE ACETATE 80 MG/ML IJ SUSP
80.0000 mg | Freq: Once | INTRAMUSCULAR | Status: AC
  Administered 2015-01-23: 80 mg via INTRAMUSCULAR

## 2015-01-23 NOTE — Assessment & Plan Note (Signed)
Mild to mod for her, for depomedrol IM, predpac asd, antibx course,cough med prn,  to f/u any worsening symptoms or concerns, hold on cxr per pt request at this time, note o2 sat is good SpO2 Readings from Last 3 Encounters:  01/23/15 94%  10/25/14 94%  10/11/14 90%

## 2015-01-23 NOTE — Patient Instructions (Signed)
You had the steroid shot today  Please take all new medication as prescribed - the antibiotic, cough medicine, and prednisone  Please continue all other medications as before, including the inhalers  Please have the pharmacy call with any other refills you may need.  Please keep your appointments with your specialists as you may have planned  You will be contacted regarding the referral for: orthopedic  Please go to the XRAY Department in the Basement (go straight as you get off the elevator) for the x-ray testing  You will be contacted by phone if any changes need to be made immediately.  Otherwise, you will receive a letter about your results with an explanation, but please check with MyChart first.  Please remember to sign up for MyChart if you have not done so, as this will be important to you in the future with finding out test results, communicating by private email, and scheduling acute appointments online when needed.

## 2015-01-23 NOTE — Progress Notes (Signed)
Subjective:    Patient ID: Michele Martin, female    DOB: [birth year], 69 y.o.   MRN: [id number]  HPI    Here with acute onset 2-3 days worsening prod cough, feels warm and weakened, mild prod cough (not sure of color but no blood) assoc with wheezing/ tightness and mild worsening sob/doe.  Came in "early" this time, has done well in past with copd exac type treatment.  Pt denies chest pain, increased sob or doe, wheezing, orthopnea, PND, increased LE swelling, palpitations, dizziness or syncope other than above. Pt denies new neurological symptoms such as new headache, or facial or extremity weakness or numbness   Pt denies polydipsia, polyuria.  ALso since she is here, mentions she is nucynta daily for chronic pain, but has sudden onset 2-3 wks ago of right hip pain with radiation to the groin and medial leg, not below the knee, no LBP or overt radicular or claudciation type symtpoms.  No recent films, no prior hx of DJD or other.  Pain quite severe, cannot cross right leg over left without picking it up. Nothing seems to make better or worse Past Medical History  Diagnosis Date  . HYPERLIPIDEMIA 09/19/2009  . ANXIETY 04/15/2010  . DEPRESSION 09/19/2009  . Acute angle-closure glaucoma 02/14/2010  . ALLERGIC RHINITIS 09/19/2009  . CHRONIC OBSTRUCTIVE PULMONARY DISEASE, ACUTE EXACERBATION 12/19/2009  . ASTHMA 09/19/2009  . COPD 06/27/2009  . BACK PAIN 02/14/2010  . TREMOR 02/14/2010  . HOARSENESS 10/17/2009  . Wheezing 06/27/2009  . MENOPAUSAL DISORDER 08/20/2010  . TINNITUS 09/10/2010  . URI 09/10/2010  . OSTEOPENIA 09/10/2010  . Chronic bronchitis 04/14/2011  . Rotator cuff tear 06/23/2011  . Tremor 07/30/2011  . Arthritis   . Complication of anesthesia   . Osteoporosis, unspecified 04/20/2014   Past Surgical History  Procedure Laterality Date  . Normal heart cath  2001    Dr. Johnsie Cancel  . Abdominal hysterectomy    . S/p laser tx for glaucoma      no vision loss  . Cervical fusion 2013 - dr  Vertell Limber      reports that she quit smoking about 3 years ago. Her smoking use included Cigarettes. She has a 40 pack-year smoking history. She has never used smokeless tobacco. She reports that she does not drink alcohol or use illicit drugs. family history includes Dementia in her mother. There is no history of Colon cancer. Allergies  Allergen Reactions  . Pravastatin     REACTION: itch and leg cramp  . Simvastatin     REACTION: itch and leg cramps  . Statins    Current Outpatient Prescriptions on File Prior to Visit  Medication Sig Dispense Refill  . albuterol (PROVENTIL HFA) 108 (90 BASE) MCG/ACT inhaler Inhale 2 puffs into the lungs every 6 (six) hours as needed for wheezing or shortness of breath. 18 g 5  . albuterol (PROVENTIL) (2.5 MG/3ML) 0.083% nebulizer solution Take 3 mLs (2.5 mg total) by nebulization every 6 (six) hours as needed for wheezing or shortness of breath. 150 mL 5  . aspirin 81 MG EC tablet Take 81 mg by mouth daily.      [name] atorvastatin (LIPITOR) 20 MG tablet Take 1 tablet (20 mg total) by mouth daily. 90 tablet 3  . budesonide-formoterol (SYMBICORT) 160-4.5 MCG/ACT inhaler Inhale 2 puffs into the lungs 2 (two) times daily. 1 Inhaler 10  . clonazePAM (KLONOPIN) 0.5 MG tablet TAKE 1 TABLET BY MOUTH TWICE DAILY AS NEEDED FOR ANXIETY 60  tablet 3  . cyclobenzaprine (FLEXERIL) 5 MG tablet Take 1 tablet (5 mg total) by mouth 3 (three) times daily as needed for muscle spasms. 60 tablet 1  . escitalopram (LEXAPRO) 20 MG tablet Take 1 tablet (20 mg total) by mouth daily. 90 tablet 1  . fexofenadine (ALLEGRA) 180 MG tablet Take 180 mg by mouth daily.      [name] gabapentin (NEURONTIN) 100 MG capsule Take 100 mg by mouth 2 (two) times daily.    . montelukast (SINGULAIR) 10 MG tablet Take 1 tablet (10 mg total) by mouth at bedtime. 30 tablet 11  . naproxen (NAPROSYN) 500 MG tablet Take 1 tablet (500 mg total) by mouth 2 (two) times daily with a meal. As needed only for pain 60 tablet 1   . tapentadol (NUCYNTA) 50 MG TABS tablet Take 50 mg by mouth 2 (two) times daily.    [name] tiotropium (SPIRIVA HANDIHALER) 18 MCG inhalation capsule Place 1 capsule (18 mcg total) into inhaler and inhale daily. 90 capsule 3  . traMADol (ULTRAM) 50 MG tablet Take 0.5-1 tablets (25-50 mg total) by mouth every 6 (six) hours as needed. For pain 60 tablet 2  . levofloxacin (LEVAQUIN) 250 MG tablet Take 1 tablet (250 mg total) by mouth daily. (Patient not taking: Reported on 01/23/2015) 10 tablet 0  . ondansetron (ZOFRAN) 4 MG tablet Take 1 tablet (4 mg total) by mouth every 8 (eight) hours as needed for nausea or vomiting. (Patient not taking: Reported on 01/23/2015) 30 tablet 0  . predniSONE (DELTASONE) 10 MG tablet 3 tabs by mouth per day for 3 days,2tabs per day for 3 days,1tab per day for 3 days (Patient not taking: Reported on 01/23/2015) 18 tablet 0   No current facility-administered medications on file prior to visit.    Review of Systems  Constitutional: Negative for unusual diaphoresis or night sweats HENT: Negative for ringing in ear or discharge Eyes: Negative for double vision or worsening visual disturbance.  Respiratory: Negative for choking and stridor.   Gastrointestinal: Negative for vomiting or other signifcant bowel change Genitourinary: Negative for hematuria or change in urine volume.  Musculoskeletal: Negative for other MSK pain or swelling Skin: Negative for color change and worsening wound.  Neurological: Negative for tremors and numbness other than noted  Psychiatric/Behavioral: Negative for decreased concentration or agitation other than above       Objective:   Physical Exam BP 128/64 mmHg  Pulse 81  Temp(Src) 98.3 F (36.8 C) (Oral)  Ht '5\' 2"'$  (1.575 m)  Wt 111 lb (50.349 kg)  BMI 20.30 kg/m2  SpO2 94% VS noted, mild ill appearing Constitutional: Pt appears in no significant distress HENT: Head: NCAT.  Right Ear: External ear normal.  Left Ear: External ear  normal.  Eyes: . Pupils are equal, round, and reactive to light. Conjunctivae and EOM are normal Neck: Normal range of motion. Neck supple.  Cardiovascular: Normal rate and regular rhythm.   Pulmonary/Chest: Effort normal and breath sounds without rales or wheezing.  Abd:  Soft, NT, ND, + BS Spine nontender, no specific paravertebral tender or swelling, no rash  No lateral right hip or groin or medial leg tender, no erythema/skin change/rash + marked pain with hip flexion and extension and abduction Neurological: Pt is alert. Not confused , motor grossly intact Skin: Skin is warm. No rash, no LE edema Psychiatric: Pt behavior is normal. No agitation.     Assessment & Plan:

## 2015-01-23 NOTE — Addendum Note (Signed)
Addended by: Biagio Borg on: 01/23/2015 05:20 PM   Modules accepted: Orders, SmartSet

## 2015-01-23 NOTE — Assessment & Plan Note (Signed)
Quite severe, not clinical c/w bursitis, diff includes DJD flare, AVN or other, for hip film today, ortho referral, cont pain control

## 2015-01-23 NOTE — Progress Notes (Signed)
Pre visit review using our clinic review tool, if applicable. No additional management support is needed unless otherwise documented below in the visit note. 

## 2015-01-23 NOTE — Addendum Note (Signed)
Addended by: Lyman Bishop on: 01/23/2015 05:24 PM   Modules accepted: Orders

## 2015-01-25 ENCOUNTER — Telehealth: Payer: Self-pay | Admitting: Internal Medicine

## 2015-01-25 NOTE — Telephone Encounter (Signed)
Patient states there was a script sent to [facility] for refill.  States company will not refill b/c Dr. Ricky Ala number was not on script.  States company is requesting new script with lic #.  Company fax number is [phone]

## 2015-01-26 MED ORDER — BUDESONIDE-FORMOTEROL FUMARATE 160-4.5 MCG/ACT IN AERO: 2.0000 | INHALATION_SPRAY | Freq: Two times a day (BID) | RESPIRATORY_TRACT | Status: DC

## 2015-01-26 NOTE — Telephone Encounter (Signed)
Done hardcopy to Dahlia  

## 2015-01-26 NOTE — Telephone Encounter (Signed)
Rx faxed to company

## 2015-02-13 DIAGNOSIS — M25551 Pain in right hip: Secondary | ICD-10-CM | POA: Diagnosis not present

## 2015-02-13 DIAGNOSIS — M25552 Pain in left hip: Secondary | ICD-10-CM | POA: Diagnosis not present

## 2015-02-13 DIAGNOSIS — M545 Low back pain: Secondary | ICD-10-CM | POA: Diagnosis not present

## 2015-02-22 DIAGNOSIS — R0902 Hypoxemia: Secondary | ICD-10-CM | POA: Diagnosis not present

## 2015-02-22 DIAGNOSIS — J449 Chronic obstructive pulmonary disease, unspecified: Secondary | ICD-10-CM | POA: Diagnosis not present

## 2015-03-13 DIAGNOSIS — M5417 Radiculopathy, lumbosacral region: Secondary | ICD-10-CM | POA: Diagnosis not present

## 2015-03-13 DIAGNOSIS — M4316 Spondylolisthesis, lumbar region: Secondary | ICD-10-CM | POA: Diagnosis not present

## 2015-03-22 ENCOUNTER — Ambulatory Visit (INDEPENDENT_AMBULATORY_CARE_PROVIDER_SITE_OTHER): Payer: Medicare Other | Admitting: Internal Medicine

## 2015-03-22 ENCOUNTER — Encounter: Payer: Self-pay | Admitting: Internal Medicine

## 2015-03-22 VITALS — BP 128/68 | HR 80 | Temp 98.2°F | Ht 61.0 in | Wt 114.0 lb

## 2015-03-22 DIAGNOSIS — J441 Chronic obstructive pulmonary disease with (acute) exacerbation: Secondary | ICD-10-CM | POA: Diagnosis not present

## 2015-03-22 MED ORDER — PREDNISONE 10 MG PO TABS: ORAL_TABLET | ORAL | Status: DC

## 2015-03-22 MED ORDER — METHYLPREDNISOLONE ACETATE 80 MG/ML IJ SUSP
80.0000 mg | Freq: Once | INTRAMUSCULAR | Status: AC
  Administered 2015-03-22: 80 mg via INTRAMUSCULAR

## 2015-03-22 MED ORDER — TIOTROPIUM BROMIDE MONOHYDRATE 18 MCG IN CAPS: 18.0000 ug | ORAL_CAPSULE | Freq: Every day | RESPIRATORY_TRACT | Status: DC

## 2015-03-22 NOTE — Progress Notes (Signed)
Pre visit review using our clinic review tool, if applicable. No additional management support is needed unless otherwise documented below in the visit note. 

## 2015-03-22 NOTE — Addendum Note (Signed)
Addended by: Lyman Bishop on: 03/22/2015 06:00 PM   Modules accepted: Orders

## 2015-03-22 NOTE — Assessment & Plan Note (Signed)
Mild to mod, for depomedrol IM, predpac asd, to f/u any worsening symptoms or concerns 

## 2015-03-22 NOTE — Progress Notes (Signed)
Subjective:    Patient ID: Michele Martin, female    DOB: [birth year], 69 y.o.   MRN: [id number]  HPI  Here with 3 days onset mild to mod wheezing, non prod cough, sob/doe without chest pain , orthopnea, PND, increased LE swelling, palpitations, dizziness or syncope.  Pt denies new neurological symptoms such as new headache, or facial or extremity weakness or numbness   Pt denies polydipsia, polyuria. Past Medical History  Diagnosis Date  . HYPERLIPIDEMIA 09/19/2009  . ANXIETY 04/15/2010  . DEPRESSION 09/19/2009  . Acute angle-closure glaucoma 02/14/2010  . ALLERGIC RHINITIS 09/19/2009  . CHRONIC OBSTRUCTIVE PULMONARY DISEASE, ACUTE EXACERBATION 12/19/2009  . ASTHMA 09/19/2009  . COPD 06/27/2009  . BACK PAIN 02/14/2010  . TREMOR 02/14/2010  . HOARSENESS 10/17/2009  . Wheezing 06/27/2009  . MENOPAUSAL DISORDER 08/20/2010  . TINNITUS 09/10/2010  . URI 09/10/2010  . OSTEOPENIA 09/10/2010  . Chronic bronchitis 04/14/2011  . Rotator cuff tear 06/23/2011  . Tremor 07/30/2011  . Arthritis   . Complication of anesthesia   . Osteoporosis, unspecified 04/20/2014   Past Surgical History  Procedure Laterality Date  . Normal heart cath  2001    Dr. Johnsie Cancel  . Abdominal hysterectomy    . S/p laser tx for glaucoma      no vision loss  . Cervical fusion 2013 - dr Vertell Limber      reports that she quit smoking about 3 years ago. Her smoking use included Cigarettes. She has a 40 pack-year smoking history. She has never used smokeless tobacco. She reports that she does not drink alcohol or use illicit drugs. family history includes Dementia in her mother. There is no history of Colon cancer. Allergies  Allergen Reactions  . Pravastatin     REACTION: itch and leg cramp  . Simvastatin     REACTION: itch and leg cramps  . Statins    Current Outpatient Prescriptions on File Prior to Visit  Medication Sig Dispense Refill  . albuterol (PROVENTIL HFA) 108 (90 BASE) MCG/ACT inhaler Inhale 2 puffs into the lungs  every 6 (six) hours as needed for wheezing or shortness of breath. 18 g 5  . albuterol (PROVENTIL) (2.5 MG/3ML) 0.083% nebulizer solution Take 3 mLs (2.5 mg total) by nebulization every 6 (six) hours as needed for wheezing or shortness of breath. 150 mL 5  . aspirin 81 MG EC tablet Take 81 mg by mouth daily.      [name] atorvastatin (LIPITOR) 20 MG tablet Take 1 tablet (20 mg total) by mouth daily. 90 tablet 3  . budesonide-formoterol (SYMBICORT) 160-4.5 MCG/ACT inhaler Inhale 2 puffs into the lungs 2 (two) times daily. 3 Inhaler 3  . clonazePAM (KLONOPIN) 0.5 MG tablet TAKE 1 TABLET BY MOUTH TWICE DAILY AS NEEDED FOR ANXIETY 60 tablet 3  . cyclobenzaprine (FLEXERIL) 5 MG tablet Take 1 tablet (5 mg total) by mouth 3 (three) times daily as needed for muscle spasms. 60 tablet 1  . escitalopram (LEXAPRO) 20 MG tablet Take 1 tablet (20 mg total) by mouth daily. 90 tablet 1  . fexofenadine (ALLEGRA) 180 MG tablet Take 180 mg by mouth daily.      [name] gabapentin (NEURONTIN) 100 MG capsule Take 100 mg by mouth 2 (two) times daily.    [name] gabapentin (NEURONTIN) 300 MG capsule     . HYDROcodone-homatropine (HYCODAN) 5-1.5 MG/5ML syrup Take 5 mLs by mouth every 6 (six) hours as needed for cough. 180 mL 0  . montelukast (SINGULAIR) 10 MG  tablet Take 1 tablet (10 mg total) by mouth at bedtime. 30 tablet 11  . tapentadol (NUCYNTA) 50 MG TABS tablet Take 50 mg by mouth 2 (two) times daily.    . traMADol (ULTRAM) 50 MG tablet Take 0.5-1 tablets (25-50 mg total) by mouth every 6 (six) hours as needed. For pain 60 tablet 2  . naproxen (NAPROSYN) 500 MG tablet Take 1 tablet (500 mg total) by mouth 2 (two) times daily with a meal. As needed only for pain (Patient not taking: Reported on 03/22/2015) 60 tablet 1  . ondansetron (ZOFRAN) 4 MG tablet Take 1 tablet (4 mg total) by mouth every 8 (eight) hours as needed for nausea or vomiting. (Patient not taking: Reported on 01/23/2015) 30 tablet 0   No current facility-administered  medications on file prior to visit.   Review of Systems  All otherwise neg per pt      Objective:   Physical Exam BP 128/68 mmHg  Pulse 80  Temp(Src) 98.2 F (36.8 C) (Oral)  Ht '5\' 1"'$  (1.549 m)  Wt 114 lb (51.71 kg)  BMI 21.55 kg/m2  SpO2 95% VS noted,  Constitutional: Pt appears in no significant distress HENT: Head: NCAT.  Right Ear: External ear normal.  Left Ear: External ear normal.  Eyes: . Pupils are equal, round, and reactive to light. Conjunctivae and EOM are normal Neck: Normal range of motion. Neck supple.  Cardiovascular: Normal rate and regular rhythm.   Pulmonary/Chest: Effort normal and breath sounds decreased with bilat wheezes without rales.  Neurological: Pt is alert. Not confused , motor grossly intact Skin: Skin is warm. No rash, no LE edema Psychiatric: Pt behavior is normal. No agitation.     Assessment & Plan:

## 2015-03-22 NOTE — Patient Instructions (Signed)
You had the steroid shot today  Please continue all other medications as before, and refills have been done if requested.  Please have the pharmacy call with any other refills you may need.  Please continue your efforts at being more active, low cholesterol diet, and weight control.  Please keep your appointments with your specialists as you may have planned

## 2015-03-22 NOTE — Addendum Note (Signed)
Addended by: Biagio Borg on: 03/22/2015 06:16 PM   Modules accepted: Miquel Dunn

## 2015-03-25 DIAGNOSIS — R0902 Hypoxemia: Secondary | ICD-10-CM | POA: Diagnosis not present

## 2015-03-25 DIAGNOSIS — J449 Chronic obstructive pulmonary disease, unspecified: Secondary | ICD-10-CM | POA: Diagnosis not present

## 2015-04-12 ENCOUNTER — Encounter: Payer: Self-pay | Admitting: Internal Medicine

## 2015-04-12 ENCOUNTER — Ambulatory Visit (INDEPENDENT_AMBULATORY_CARE_PROVIDER_SITE_OTHER): Payer: Medicare Other | Admitting: Internal Medicine

## 2015-04-12 VITALS — BP 116/62 | HR 78 | Temp 97.6°F | Wt 113.8 lb

## 2015-04-12 DIAGNOSIS — F329 Major depressive disorder, single episode, unspecified: Secondary | ICD-10-CM

## 2015-04-12 DIAGNOSIS — F32A Depression, unspecified: Secondary | ICD-10-CM

## 2015-04-12 DIAGNOSIS — Z23 Encounter for immunization: Secondary | ICD-10-CM | POA: Diagnosis not present

## 2015-04-12 DIAGNOSIS — J441 Chronic obstructive pulmonary disease with (acute) exacerbation: Secondary | ICD-10-CM | POA: Diagnosis not present

## 2015-04-12 DIAGNOSIS — F411 Generalized anxiety disorder: Secondary | ICD-10-CM | POA: Diagnosis not present

## 2015-04-12 MED ORDER — CLONAZEPAM 0.5 MG PO TABS: 0.5000 mg | ORAL_TABLET | Freq: Two times a day (BID) | ORAL | Status: DC | PRN

## 2015-04-12 MED ORDER — HYDROCODONE-HOMATROPINE 5-1.5 MG/5ML PO SYRP: 5.0000 mL | ORAL_SOLUTION | Freq: Four times a day (QID) | ORAL | Status: DC | PRN

## 2015-04-12 MED ORDER — PREDNISONE 10 MG PO TABS: ORAL_TABLET | ORAL | Status: DC

## 2015-04-12 NOTE — Progress Notes (Signed)
Pre visit review using our clinic review tool, if applicable. No additional management support is needed unless otherwise documented below in the visit note. 

## 2015-04-12 NOTE — Patient Instructions (Signed)
Please take all new medication as prescribed - the prednisone, cough medicine, and clonazepam for nerves  You had the steroid shot today  Please continue all other medications as before, and refills have been done if requested.  Please have the pharmacy call with any other refills you may need.  Please keep your appointments with your specialists as you may have planned  You will be contacted regarding the referral for: Pulmonary

## 2015-04-12 NOTE — Progress Notes (Signed)
Subjective:    Patient ID: Michele Martin, female    DOB: [birth year], 69 y.o.   MRN: [id number]  HPI  Here to f/u, denies fever, prod cough or ST, but c/o 2-3 days onset sob/wheezing/mild doe overall mild, and Pt denies chest pain, orthopnea, PND, increased LE swelling, palpitations, dizziness or syncope. Pt denies new neurological symptoms such as new headache, or facial or extremity weakness or numbness   Pt denies polydipsia, polyuria, Pt states overall good compliance with meds. More stress with daughter recently, Denies worsening depressive symptoms, suicidal ideation, or panic; has ongoing anxiety Past Medical History  Diagnosis Date  . HYPERLIPIDEMIA 09/19/2009  . ANXIETY 04/15/2010  . DEPRESSION 09/19/2009  . Acute angle-closure glaucoma 02/14/2010  . ALLERGIC RHINITIS 09/19/2009  . CHRONIC OBSTRUCTIVE PULMONARY DISEASE, ACUTE EXACERBATION 12/19/2009  . ASTHMA 09/19/2009  . COPD 06/27/2009  . BACK PAIN 02/14/2010  . TREMOR 02/14/2010  . HOARSENESS 10/17/2009  . Wheezing 06/27/2009  . MENOPAUSAL DISORDER 08/20/2010  . TINNITUS 09/10/2010  . URI 09/10/2010  . OSTEOPENIA 09/10/2010  . Chronic bronchitis 04/14/2011  . Rotator cuff tear 06/23/2011  . Tremor 07/30/2011  . Arthritis   . Complication of anesthesia   . Osteoporosis, unspecified 04/20/2014   Past Surgical History  Procedure Laterality Date  . Normal heart cath  2001    Dr. Johnsie Cancel  . Abdominal hysterectomy    . S/p laser tx for glaucoma      no vision loss  . Cervical fusion 2013 - dr Vertell Limber      reports that she quit smoking about 3 years ago. Her smoking use included Cigarettes. She has a 40 pack-year smoking history. She has never used smokeless tobacco. She reports that she does not drink alcohol or use illicit drugs. family history includes Dementia in her mother. There is no history of Colon cancer. Allergies  Allergen Reactions  . Pravastatin     REACTION: itch and leg cramp  . Simvastatin     REACTION: itch and  leg cramps  . Statins    Current Outpatient Prescriptions on File Prior to Visit  Medication Sig Dispense Refill  . albuterol (PROVENTIL HFA) 108 (90 BASE) MCG/ACT inhaler Inhale 2 puffs into the lungs every 6 (six) hours as needed for wheezing or shortness of breath. 18 g 5  . albuterol (PROVENTIL) (2.5 MG/3ML) 0.083% nebulizer solution Take 3 mLs (2.5 mg total) by nebulization every 6 (six) hours as needed for wheezing or shortness of breath. 150 mL 5  . aspirin 81 MG EC tablet Take 81 mg by mouth daily.      [name] atorvastatin (LIPITOR) 20 MG tablet Take 1 tablet (20 mg total) by mouth daily. 90 tablet 3  . budesonide-formoterol (SYMBICORT) 160-4.5 MCG/ACT inhaler Inhale 2 puffs into the lungs 2 (two) times daily. 3 Inhaler 3  . cyclobenzaprine (FLEXERIL) 5 MG tablet Take 1 tablet (5 mg total) by mouth 3 (three) times daily as needed for muscle spasms. 60 tablet 1  . escitalopram (LEXAPRO) 20 MG tablet Take 1 tablet (20 mg total) by mouth daily. 90 tablet 1  . fexofenadine (ALLEGRA) 180 MG tablet Take 180 mg by mouth daily.      [name] gabapentin (NEURONTIN) 100 MG capsule Take 100 mg by mouth 2 (two) times daily.    [name] gabapentin (NEURONTIN) 300 MG capsule     . montelukast (SINGULAIR) 10 MG tablet Take 1 tablet (10 mg total) by mouth at bedtime. 30 tablet 11  .  naproxen (NAPROSYN) 500 MG tablet Take 1 tablet (500 mg total) by mouth 2 (two) times daily with a meal. As needed only for pain 60 tablet 1  . tapentadol (NUCYNTA) 50 MG TABS tablet Take 50 mg by mouth 2 (two) times daily.    [name] tiotropium (SPIRIVA HANDIHALER) 18 MCG inhalation capsule Place 1 capsule (18 mcg total) into inhaler and inhale daily. 90 capsule 3  . traMADol (ULTRAM) 50 MG tablet Take 0.5-1 tablets (25-50 mg total) by mouth every 6 (six) hours as needed. For pain 60 tablet 2  . ondansetron (ZOFRAN) 4 MG tablet Take 1 tablet (4 mg total) by mouth every 8 (eight) hours as needed for nausea or vomiting. (Patient not taking: Reported  on 01/23/2015) 30 tablet 0   No current facility-administered medications on file prior to visit.   Review of Systems  Constitutional: Negative for unusual diaphoresis or night sweats HENT: Negative for ringing in ear or discharge Eyes: Negative for double vision or worsening visual disturbance.  Respiratory: Negative for choking and stridor.   Gastrointestinal: Negative for vomiting or other signifcant bowel change Genitourinary: Negative for hematuria or change in urine volume.  Musculoskeletal: Negative for other MSK pain or swelling Skin: Negative for color change and worsening wound.  Neurological: Negative for tremors and numbness other than noted  Psychiatric/Behavioral: Negative for decreased concentration or agitation other than above       Objective:   Physical Exam BP 116/62 mmHg  Pulse 78  Temp(Src) 97.6 F (36.4 C)  Wt 113 lb 12 oz (51.597 kg)  SpO2 93% VS noted, non toxic Constitutional: Pt appears in no significant distress HENT: Head: NCAT.  Right Ear: External ear normal.  Left Ear: External ear normal.  Eyes: . Pupils are equal, round, and reactive to light. Conjunctivae and EOM are normal Neck: Normal range of motion. Neck supple.  Cardiovascular: Normal rate and regular rhythm.   Pulmonary/Chest: Effort normal and breath sounds decreased without rales, + bilat mild wheezes Neurological: Pt is alert. Not confused , motor grossly intact Skin: Skin is warm. No rash, no LE edema Psychiatric: Pt behavior is normal. No agitation. 1-2+ nervous    Assessment & Plan:

## 2015-04-13 DIAGNOSIS — Z23 Encounter for immunization: Secondary | ICD-10-CM | POA: Diagnosis not present

## 2015-04-13 DIAGNOSIS — J441 Chronic obstructive pulmonary disease with (acute) exacerbation: Secondary | ICD-10-CM | POA: Diagnosis not present

## 2015-04-13 MED ORDER — METHYLPREDNISOLONE ACETATE 80 MG/ML IJ SUSP
80.0000 mg | Freq: Once | INTRAMUSCULAR | Status: AC
  Administered 2015-04-13: 80 mg via INTRAMUSCULAR

## 2015-04-14 NOTE — Assessment & Plan Note (Signed)
.  Mild to mod, for klonopin prn, declines referral counseling,  to f/u any worsening symptoms or concerns

## 2015-04-14 NOTE — Assessment & Plan Note (Signed)
stable overall by history and exam, recent data reviewed with pt, and pt to continue medical treatment as before,  to f/u any worsening symptoms or concerns Lab Results  Component Value Date   WBC 7.2 07/31/2014   HGB 15.7* 07/31/2014   HCT 48.1* 07/31/2014   PLT 191.0 07/31/2014   GLUCOSE 88 07/31/2014   CHOL 198 07/31/2014   TRIG 86.0 07/31/2014   HDL 67.30 07/31/2014   LDLDIRECT 164.4 07/19/2013   LDLCALC 114* 07/31/2014   ALT 16 07/31/2014   AST 20 07/31/2014   NA 144 07/31/2014   K 4.0 07/31/2014   CL 106 07/31/2014   CREATININE 0.5 07/31/2014   BUN 23 07/31/2014   CO2 29 07/31/2014   TSH 1.59 07/31/2014   HGBA1C 5.6 08/01/2011    

## 2015-04-14 NOTE — Assessment & Plan Note (Addendum)
Mild to mod, for depomedrol IM, predpac asd,  to f/u any worsening symptoms or concerns, has been lost to f/u with pulm - will refer

## 2015-04-25 DIAGNOSIS — J449 Chronic obstructive pulmonary disease, unspecified: Secondary | ICD-10-CM | POA: Diagnosis not present

## 2015-04-25 DIAGNOSIS — R0902 Hypoxemia: Secondary | ICD-10-CM | POA: Diagnosis not present

## 2015-05-03 ENCOUNTER — Other Ambulatory Visit: Payer: Self-pay | Admitting: Internal Medicine

## 2015-05-07 DIAGNOSIS — H5211 Myopia, right eye: Secondary | ICD-10-CM | POA: Diagnosis not present

## 2015-05-07 DIAGNOSIS — H25013 Cortical age-related cataract, bilateral: Secondary | ICD-10-CM | POA: Diagnosis not present

## 2015-05-07 DIAGNOSIS — H25043 Posterior subcapsular polar age-related cataract, bilateral: Secondary | ICD-10-CM | POA: Diagnosis not present

## 2015-05-08 ENCOUNTER — Ambulatory Visit (INDEPENDENT_AMBULATORY_CARE_PROVIDER_SITE_OTHER): Payer: Medicare Other | Admitting: Internal Medicine

## 2015-05-08 ENCOUNTER — Encounter: Payer: Self-pay | Admitting: Internal Medicine

## 2015-05-08 VITALS — BP 124/76 | HR 58 | Ht 61.0 in | Wt 114.8 lb

## 2015-05-08 DIAGNOSIS — R911 Solitary pulmonary nodule: Secondary | ICD-10-CM | POA: Diagnosis not present

## 2015-05-08 DIAGNOSIS — F17200 Nicotine dependence, unspecified, uncomplicated: Secondary | ICD-10-CM

## 2015-05-08 DIAGNOSIS — J449 Chronic obstructive pulmonary disease, unspecified: Secondary | ICD-10-CM | POA: Diagnosis not present

## 2015-05-08 DIAGNOSIS — Z720 Tobacco use: Secondary | ICD-10-CM

## 2015-05-08 MED ORDER — TIOTROPIUM BROMIDE MONOHYDRATE 2.5 MCG/ACT IN AERS: INHALATION_SPRAY | RESPIRATORY_TRACT | Status: DC

## 2015-05-08 NOTE — Progress Notes (Signed)
Subjective:    Patient ID: Michele Martin, female    DOB: 23-Jan-1946,   MRN: [id number]  HPI  Brief patient profile:  56  yowf active smoker referred to pulmonary clinic 04/2011 by Dr Jenny Reichmann for eval of sob.   History of Present Illness  05/20/2011 Initial pulmonary office eval cc sob x "many years" indolent onset and quit variable = much better on symbicort and spiriva  mostly with humidity but when the weather's cool can do anything she wants x for heavy yardwork. Also using neb qid  Not prn.  rec You must stop smoking before smoking stops you Symbicort 160 Take 2 puffs first thing in am and then another 2 puffs about 12 hours later.  and chase the am dose of symbicort with spiriva Only use your albuterol (not duoneb, though ok to use it up) as a rescue medication    09/01/2011 f/u ov/Rontae Inglett quit smoking 1 week prior to OV  Cc no change doe very little need for neb x for "with the flu".  For surgery by Dr Atilano Ina ? Spinal stenosis but needs clearance.  Can do Kristopher Oppenheim but no mall. Congested cough each am x sev tbsp of mucoid sputum. rec Work on Gaffer:    Only use your albuterol as a rescue medication to be used if you can't catch your breath by resting or doing a relaxed purse lip breathing pattern. The less you use it, the better it will work when you need it.  You are very high risk for surgery and I recommend you put if off for at least a month if possible and return here for final preop eval with a family member with you if possible Congratulations on not smoking - it's the most important aspect of your care!!   10/03/2011 f/u ov/Elliotte Marsalis last cigarette around 1st Feb 2013 and overall much better breathing on symbicort and spiriva, no cough or daytime need for rescue saba. Not limited by sob but not very active. No sign cough. rec You are cleared for the surgery but need to maintain off the cigarettes at all costs Work on inhaler technique:     05/08/2015  f/u ov/Bertran Zeimet re: re-establish for copd / resumed smoking  Chief Complaint  Patient presents with  . Pulmonary Consult    Pt here to re-est care. She was last seen in 2013. She c/o chest tightness "but I think it's my nerves" for the past month. She states her breathing is unchanged since last visit here. She has occ cough with clear sputum.   maint on symb/ spiriva/ prn saba rarely use it  Main problem is hot/humid days, still able to cross the parking lot and do her own shopping but struggling more with sob  Lots of am mucus congestion x 30 min coughing fit each am.   No obvious other factors (than heat/humidity) to explain  day to day or daytime variability or assoc c  cp or  subjective wheeze or overt sinus or hb symptoms. No unusual exp hx or h/o childhood pna/ asthma or knowledge of premature birth.  Sleeping ok without nocturnalexacerbation  of respiratory  c/o's or need for noct saba. Also denies any obvious fluctuation of symptoms with weather or environmental changes or other aggravating or alleviating factors except as outlined above   Current Medications, Allergies, Complete Past Medical History, Past Surgical History, Family History, and Social History were reviewed in [employer] record.  Review of Systems  Constitutional: Negative for fever, chills and unexpected weight change.  HENT: Negative for congestion, dental problem, ear pain, nosebleeds, postnasal drip, rhinorrhea, sinus pressure, sneezing, sore throat, trouble swallowing and voice change.   Eyes: Negative for visual disturbance.  Respiratory: Positive for cough and shortness of breath. Negative for choking.   Cardiovascular: Negative for chest pain and leg swelling.  Gastrointestinal: Negative for vomiting, abdominal pain and diarrhea.  Genitourinary: Negative for difficulty urinating.  Musculoskeletal: Negative for arthralgias.  Skin: Negative for rash.   Neurological: Negative for tremors, syncope and headaches.  Hematological: Does not bruise/bleed easily.       Objective:   Physical Exam  amb pleasant wf nad with smoker's rattle   Wt Readings from Last 3 Encounters:  05/08/15 114 lb 12.8 oz (52.073 kg)  04/12/15 113 lb 12 oz (51.597 kg)  03/22/15 114 lb (51.71 kg)    Vital signs reviewed   HEENT: edentulous/ nl turbinates, and orophanx. Nl external ear canals without cough reflex   NECK :  without JVD/Nodes/TM/ nl carotid upstrokes bilaterally   LUNGS: no acc muscle use, distant bs bilaterally/ a few end exp rhonchi bilaterally worse with fvc   CV:  RRR  no s3 or murmur or increase in P2, no edema   ABD:  soft and nontender with nl excursion in the supine position. No bruits or organomegaly, bowel sounds nl  MS:  warm without deformities, calf tenderness, cyanosis or clubbing  SKIN: warm and dry without lesions  / weathered facies  NEURO:  alert, approp, no deficits    I personally reviewed images and agree with radiology impression as follows:  CT no contrast  09/04/14 Irregular 5.5 mm nodule in the posterior right upper lobe. Lung-RADS Category 2, benign appearance or behavior. Continue annual screening with low-dose chest CT without contrast in 12 months. Additional smaller nodules measuring up to 5 mm, some of which are calcified.  Underlying moderate to severe emphysematous changes.           Assessment & Plan:

## 2015-05-08 NOTE — Patient Instructions (Addendum)
The key is to stop smoking completely before smoking completely stops you!   Next time you need a refill on spiriva, get the respimat form and bring it in and I'll show you how to use it if you can't figure it out

## 2015-05-09 ENCOUNTER — Encounter: Payer: Self-pay | Admitting: Internal Medicine

## 2015-05-09 DIAGNOSIS — H40033 Anatomical narrow angle, bilateral: Secondary | ICD-10-CM | POA: Diagnosis not present

## 2015-05-09 DIAGNOSIS — H25813 Combined forms of age-related cataract, bilateral: Secondary | ICD-10-CM | POA: Diagnosis not present

## 2015-05-09 DIAGNOSIS — R911 Solitary pulmonary nodule: Secondary | ICD-10-CM | POA: Insufficient documentation

## 2015-05-09 NOTE — Assessment & Plan Note (Signed)

## 2015-05-09 NOTE — Assessment & Plan Note (Addendum)
PFT's  04/20/13   FEV1 0.94 (46 % ) ratio 55  p 20 % improvement from saba with DLCO  64 % corrects to 88 % for alv volume - 05/08/2015  extensive coaching HFA effectiveness =    75%     DDX of  difficult airways management all start with A and  include Adherence, Ace Inhibitors, Acid Reflux, Active Sinus Disease, Alpha 1 Antitripsin deficiency, Anxiety masquerading as Airways dz,  ABPA,  allergy(esp in young), Aspiration (esp in elderly), Adverse effects of meds,  Active smokers, A bunch of PE's (a small clot burden can't cause this syndrome unless there is already severe underlying pulm or vascular dz with poor reserve) plus two Bs  = Bronchiectasis and Beta blocker use..and one C= CHF  Adherence is always the initial "prime suspect" and is a multilayered concern that requires a "trust but verify" approach in every patient - starting with knowing how to use medications, especially inhalers, correctly, keeping up with refills and understanding the fundamental difference between maintenance and prns vs those medications only taken for a very short course and then stopped and not refilled.  -The proper method of use, as well as anticipated side effects, of a metered-dose inhaler are discussed and demonstrated to the patient. Improved effectiveness after extensive coaching during this visit to a level of approximately  75% so ok ok symbicort/ should probably also change to spiriva respimat at this point and learn/ master one technique for all three inhalers  Active smoking obviously the other issue > see sep a/p   ? Allergies > doubt all that important but reasonable to rx with singulair / prn allegra  ? Anxiety > usually at the bottom of this list of usual suspects but should be much higher on this pt's based on H and P and note already on psychotropics .   I had an extended discussion with the patient reviewing all relevant studies completed to date and  Lasting 28 m   Each maintenance medication was  reviewed in detail including most importantly the difference between maintenance and prns and under what circumstances the prns are to be triggered using an action plan format that is not reflected in the computer generated alphabetically organized AVS.    Please see instructions for details which were reviewed in writing and the patient given a copy highlighting the part that I personally wrote and discussed at today's ov.

## 2015-05-09 NOTE — Assessment & Plan Note (Signed)
See low dose CT 09/04/2014 > in computer for recall

## 2015-05-10 ENCOUNTER — Telehealth: Payer: Self-pay | Admitting: Internal Medicine

## 2015-05-10 MED ORDER — BUDESONIDE-FORMOTEROL FUMARATE 160-4.5 MCG/ACT IN AERO: 2.0000 | INHALATION_SPRAY | Freq: Two times a day (BID) | RESPIRATORY_TRACT | Status: DC

## 2015-05-10 NOTE — Telephone Encounter (Signed)
Patient was recently denied refill for symbicort saying it was not appropriate. She states that her inhalers only last her a month and she is out of the inhalers. Please call patient regarding this at [phone]

## 2015-05-25 DIAGNOSIS — R0902 Hypoxemia: Secondary | ICD-10-CM | POA: Diagnosis not present

## 2015-05-25 DIAGNOSIS — J449 Chronic obstructive pulmonary disease, unspecified: Secondary | ICD-10-CM | POA: Diagnosis not present

## 2015-06-07 DIAGNOSIS — H2511 Age-related nuclear cataract, right eye: Secondary | ICD-10-CM | POA: Diagnosis not present

## 2015-06-07 DIAGNOSIS — H268 Other specified cataract: Secondary | ICD-10-CM | POA: Diagnosis not present

## 2015-06-13 DIAGNOSIS — M4316 Spondylolisthesis, lumbar region: Secondary | ICD-10-CM | POA: Diagnosis not present

## 2015-06-13 DIAGNOSIS — M5417 Radiculopathy, lumbosacral region: Secondary | ICD-10-CM | POA: Diagnosis not present

## 2015-06-15 DIAGNOSIS — D329 Benign neoplasm of meninges, unspecified: Secondary | ICD-10-CM | POA: Diagnosis not present

## 2015-06-20 DIAGNOSIS — R51 Headache: Secondary | ICD-10-CM | POA: Diagnosis not present

## 2015-06-20 DIAGNOSIS — D329 Benign neoplasm of meninges, unspecified: Secondary | ICD-10-CM | POA: Diagnosis not present

## 2015-06-25 DIAGNOSIS — D329 Benign neoplasm of meninges, unspecified: Secondary | ICD-10-CM | POA: Diagnosis not present

## 2015-06-25 DIAGNOSIS — J449 Chronic obstructive pulmonary disease, unspecified: Secondary | ICD-10-CM | POA: Diagnosis not present

## 2015-06-25 DIAGNOSIS — R0902 Hypoxemia: Secondary | ICD-10-CM | POA: Diagnosis not present

## 2015-06-29 ENCOUNTER — Telehealth: Payer: Self-pay | Admitting: Internal Medicine

## 2015-06-29 NOTE — Telephone Encounter (Signed)
Michele Martin from [place], [employer] would like for you to call her. Patient has sent in an application for assistance and she has some questions she would like to ask. Her number is [phone]

## 2015-07-03 ENCOUNTER — Ambulatory Visit (INDEPENDENT_AMBULATORY_CARE_PROVIDER_SITE_OTHER): Payer: Medicare Other | Admitting: Internal Medicine

## 2015-07-03 ENCOUNTER — Encounter: Payer: Self-pay | Admitting: Internal Medicine

## 2015-07-03 VITALS — BP 110/70 | HR 74 | Temp 98.0°F | Ht 61.0 in | Wt 114.0 lb

## 2015-07-03 DIAGNOSIS — Z0000 Encounter for general adult medical examination without abnormal findings: Secondary | ICD-10-CM

## 2015-07-03 DIAGNOSIS — R413 Other amnesia: Secondary | ICD-10-CM

## 2015-07-03 DIAGNOSIS — Z0189 Encounter for other specified special examinations: Secondary | ICD-10-CM

## 2015-07-03 DIAGNOSIS — J441 Chronic obstructive pulmonary disease with (acute) exacerbation: Secondary | ICD-10-CM | POA: Diagnosis not present

## 2015-07-03 DIAGNOSIS — F411 Generalized anxiety disorder: Secondary | ICD-10-CM

## 2015-07-03 MED ORDER — METHYLPREDNISOLONE ACETATE 80 MG/ML IJ SUSP
80.0000 mg | Freq: Once | INTRAMUSCULAR | Status: AC
  Administered 2015-07-03: 80 mg via INTRAMUSCULAR

## 2015-07-03 MED ORDER — PREDNISONE 10 MG PO TABS: ORAL_TABLET | ORAL | Status: DC

## 2015-07-03 MED ORDER — LEVOFLOXACIN 250 MG PO TABS: 250.0000 mg | ORAL_TABLET | Freq: Every day | ORAL | Status: DC

## 2015-07-03 MED ORDER — BENZONATATE 100 MG PO CAPS: ORAL_CAPSULE | ORAL | Status: DC

## 2015-07-03 NOTE — Assessment & Plan Note (Addendum)
?   Pseudodementia vs mild cognitive impairment  - recent head MRI reportedly neg, pt requests neurology referral, is on nucynta - not clear if this is contributing

## 2015-07-03 NOTE — Assessment & Plan Note (Signed)
Mild to mod, for antibx course, depomedrol IM, predpac asd, tess perle prn, and continued inhaler use,  to f/u any worsening symptoms or concerns

## 2015-07-03 NOTE — Progress Notes (Signed)
Subjective:    Patient ID: Michele Martin, female    DOB: [birth year], 69 y.o.   MRN: [id number]  HPI  Here with acute onset mild to mod 1 wk ST, HA, general weakness and malaise, with prod cough greenish sputum and mild sob and wheezing this time, but Pt denies chest pain, orthopnea, PND, increased LE swelling, palpitations, dizziness or syncope. Sister ill with similar after both went to [employer] prior to onset.  Also with recent 2-3 months of "memory lapses" such as unable to remember names at times ( as well as later), unable to recall conversations and discussions and has been remarked by her family quite a bit lately. Very concerned about mother with dementia, and another relative with early dementia at 39.  Did see Dr Stern/NS last wk and mentioned, and who obtained head MRI per pt but I dont have results on EMR, pt states was told no acute.  Pt denies new neurological symptoms such as new headache, or facial or extremity weakness or numbness  Denies worsening depressive symptoms, suicidal ideation, or panic; has ongoing anxiety.  Thinks Nucynta may be too strong for her, and has only been taking once daily instead of BID, has not addressed with her pain med provider Past Medical History  Diagnosis Date  . HYPERLIPIDEMIA 09/19/2009  . ANXIETY 04/15/2010  . DEPRESSION 09/19/2009  . Acute angle-closure glaucoma 02/14/2010  . ALLERGIC RHINITIS 09/19/2009  . CHRONIC OBSTRUCTIVE PULMONARY DISEASE, ACUTE EXACERBATION 12/19/2009  . ASTHMA 09/19/2009  . COPD 06/27/2009  . BACK PAIN 02/14/2010  . TREMOR 02/14/2010  . HOARSENESS 10/17/2009  . Wheezing 06/27/2009  . MENOPAUSAL DISORDER 08/20/2010  . TINNITUS 09/10/2010  . URI 09/10/2010  . OSTEOPENIA 09/10/2010  . Chronic bronchitis 04/14/2011  . Rotator cuff tear 06/23/2011  . Tremor 07/30/2011  . Arthritis   . Complication of anesthesia   . Osteoporosis, unspecified 04/20/2014   Past Surgical History  Procedure Laterality Date  . Normal heart cath   2001    Dr. Johnsie Cancel  . Abdominal hysterectomy    . S/p laser tx for glaucoma      no vision loss  . Cervical fusion 2013 - dr Vertell Limber      reports that she has been smoking Cigarettes.  She has a 40 pack-year smoking history. She has never used smokeless tobacco. She reports that she does not drink alcohol or use illicit drugs. family history includes Dementia in her mother; Rheum arthritis in her maternal uncle. There is no history of Colon cancer. Allergies  Allergen Reactions  . Pravastatin     REACTION: itch and leg cramp  . Simvastatin     REACTION: itch and leg cramps  . Statins    Current Outpatient Prescriptions on File Prior to Visit  Medication Sig Dispense Refill  . albuterol (PROVENTIL HFA) 108 (90 BASE) MCG/ACT inhaler Inhale 2 puffs into the lungs every 6 (six) hours as needed for wheezing or shortness of breath. 18 g 5  . albuterol (PROVENTIL) (2.5 MG/3ML) 0.083% nebulizer solution Take 3 mLs (2.5 mg total) by nebulization every 6 (six) hours as needed for wheezing or shortness of breath. 150 mL 5  . aspirin 81 MG EC tablet Take 81 mg by mouth daily.      [name] atorvastatin (LIPITOR) 20 MG tablet Take 1 tablet (20 mg total) by mouth daily. 90 tablet 3  . budesonide-formoterol (SYMBICORT) 160-4.5 MCG/ACT inhaler Inhale 2 puffs into the lungs 2 (two) times daily. 3 Inhaler  3  . escitalopram (LEXAPRO) 20 MG tablet Take 1 tablet (20 mg total) by mouth daily. 90 tablet 1  . fexofenadine (ALLEGRA) 180 MG tablet Take 180 mg by mouth daily.      [name] gabapentin (NEURONTIN) 300 MG capsule Take 300 mg by mouth 3 (three) times daily.    . montelukast (SINGULAIR) 10 MG tablet Take 1 tablet (10 mg total) by mouth at bedtime. 30 tablet 11  . tapentadol (NUCYNTA) 50 MG TABS tablet Take 50 mg by mouth daily.     . Tiotropium Bromide Monohydrate (SPIRIVA RESPIMAT) 2.5 MCG/ACT AERS 2 puffs each am 1 Inhaler 11   No current facility-administered medications on file prior to visit.   Review of  Systems  Constitutional: Negative for unusual diaphoresis or night sweats HENT: Negative for ringing in ear or discharge Eyes: Negative for double vision or worsening visual disturbance.  Respiratory: Negative for choking and stridor.   Gastrointestinal: Negative for vomiting or other signifcant bowel change Genitourinary: Negative for hematuria or change in urine volume.  Musculoskeletal: Negative for other MSK pain or swelling Skin: Negative for color change and worsening wound.  Neurological: Negative for tremors and numbness other than noted  Psychiatric/Behavioral: Negative for decreased concentration or agitation other than above       Objective:   Physical Exam BP 110/70 mmHg  Pulse 74  Temp(Src) 98 F (36.7 C) (Oral)  Ht '5\' 1"'$  (1.549 m)  Wt 114 lb (51.71 kg)  BMI 21.55 kg/m2  SpO2 93% VS noted, mild ill  Constitutional: Pt appears in no significant distress HENT: Head: NCAT.  Right Ear: External ear normal.  Left Ear: External ear normal.  Eyes: . Pupils are equal, round, and reactive to light. Conjunctivae and EOM are normal Neck: Normal range of motion. Neck supple.  Cardiovascular: Normal rate and regular rhythm.   Pulmonary/Chest: Effort normal and breath sounds decreased without rales but mild wheezing noted bilat.  Neurological: Pt is alert. Not confused , motor grossly intact, gait intact Skin: Skin is warm. No rash, no LE edema Psychiatric: Pt behavior is normal. No agitation. mild nervous    Assessment & Plan:

## 2015-07-03 NOTE — Assessment & Plan Note (Signed)
Overall stable it seems, cont current tx, declines need for counseling,  to f/u any worsening symptoms or concerns

## 2015-07-03 NOTE — Patient Instructions (Addendum)
You had the steroid shot today  Please take all new medication as prescribed  - the antibiotic, cough pills, and prednisone  Please continue all other medications as before, including your inhaler  Please have the pharmacy call with any other refills you may need.  Please keep your appointments with your specialists as you may have planned  You will be contacted regarding the referral for: Neurology  Please return in 1 month, or sooner if needed, with Lab testing done 3-5 days before

## 2015-07-03 NOTE — Progress Notes (Signed)
Pre visit review using our clinic review tool, if applicable. No additional management support is needed unless otherwise documented below in the visit note. 

## 2015-07-17 ENCOUNTER — Encounter: Payer: Self-pay | Admitting: Internal Medicine

## 2015-07-17 ENCOUNTER — Ambulatory Visit (INDEPENDENT_AMBULATORY_CARE_PROVIDER_SITE_OTHER)
Admission: RE | Admit: 2015-07-17 | Discharge: 2015-07-17 | Disposition: A | Discharge status: Home / Self Care | Payer: Medicare Other | Source: Ambulatory Visit | Attending: Internal Medicine | Admitting: Internal Medicine

## 2015-07-17 ENCOUNTER — Ambulatory Visit (INDEPENDENT_AMBULATORY_CARE_PROVIDER_SITE_OTHER): Payer: Medicare Other | Admitting: Internal Medicine

## 2015-07-17 VITALS — BP 104/58 | HR 91 | Temp 98.3°F | Ht 61.0 in | Wt 112.0 lb

## 2015-07-17 DIAGNOSIS — F32A Depression, unspecified: Secondary | ICD-10-CM

## 2015-07-17 DIAGNOSIS — R079 Chest pain, unspecified: Secondary | ICD-10-CM

## 2015-07-17 DIAGNOSIS — R05 Cough: Secondary | ICD-10-CM | POA: Diagnosis not present

## 2015-07-17 DIAGNOSIS — J441 Chronic obstructive pulmonary disease with (acute) exacerbation: Secondary | ICD-10-CM

## 2015-07-17 DIAGNOSIS — F411 Generalized anxiety disorder: Secondary | ICD-10-CM

## 2015-07-17 DIAGNOSIS — F329 Major depressive disorder, single episode, unspecified: Secondary | ICD-10-CM | POA: Diagnosis not present

## 2015-07-17 MED ORDER — METHYLPREDNISOLONE ACETATE 80 MG/ML IJ SUSP
80.0000 mg | Freq: Once | INTRAMUSCULAR | Status: AC
  Administered 2015-07-17: 80 mg via INTRAMUSCULAR

## 2015-07-17 MED ORDER — AZITHROMYCIN 250 MG PO TABS: ORAL_TABLET | ORAL | Status: DC

## 2015-07-17 MED ORDER — PREDNISONE 10 MG PO TABS: ORAL_TABLET | ORAL | Status: DC

## 2015-07-17 NOTE — Progress Notes (Signed)
Pre visit review using our clinic review tool, if applicable. No additional management support is needed unless otherwise documented below in the visit note. 

## 2015-07-17 NOTE — Progress Notes (Signed)
Subjective:    Patient ID: Michele Martin, female    DOB: 1946-05-07, 69 y.o.   MRN: [id number]  HPI  Here to f/u, oveall improved without fever or prod cough, but c/o improved, then worsenins wheezing/sob mild, now with unusual bilat costal margin area discomfort to bilat lower ant chest, sharp, worse with deep breathing, o/w nonexertional, nonpositional, and not assoc with diaphoresis, n/v, radiation, palps or dizziness.  Pt denies new neurological symptoms such as new headache, or facial or extremity weakness or numbness   Pt denies polydipsia, polyuria  Denies worsening depressive symptoms, suicidal ideation, or panic; Past Medical History  Diagnosis Date  . HYPERLIPIDEMIA 09/19/2009  . ANXIETY 04/15/2010  . DEPRESSION 09/19/2009  . Acute angle-closure glaucoma 02/14/2010  . ALLERGIC RHINITIS 09/19/2009  . CHRONIC OBSTRUCTIVE PULMONARY DISEASE, ACUTE EXACERBATION 12/19/2009  . ASTHMA 09/19/2009  . COPD 06/27/2009  . BACK PAIN 02/14/2010  . TREMOR 02/14/2010  . HOARSENESS 10/17/2009  . Wheezing 06/27/2009  . MENOPAUSAL DISORDER 08/20/2010  . TINNITUS 09/10/2010  . URI 09/10/2010  . OSTEOPENIA 09/10/2010  . Chronic bronchitis 04/14/2011  . Rotator cuff tear 06/23/2011  . Tremor 07/30/2011  . Arthritis   . Complication of anesthesia   . Osteoporosis, unspecified 04/20/2014   Past Surgical History  Procedure Laterality Date  . Normal heart cath  2001    Dr. Johnsie Cancel  . Abdominal hysterectomy    . S/p laser tx for glaucoma      no vision loss  . Cervical fusion 2013 - dr Vertell Limber      reports that she has been smoking Cigarettes.  She started smoking about 2 weeks ago. She has a 40 pack-year smoking history. She has never used smokeless tobacco. She reports that she does not drink alcohol or use illicit drugs. family history includes Dementia in her mother; Rheum arthritis in her maternal uncle. There is no history of Colon cancer. Allergies  Allergen Reactions  . Pravastatin     REACTION:  itch and leg cramp  . Simvastatin     REACTION: itch and leg cramps  . Statins    Current Outpatient Prescriptions on File Prior to Visit  Medication Sig Dispense Refill  . albuterol (PROVENTIL HFA) 108 (90 BASE) MCG/ACT inhaler Inhale 2 puffs into the lungs every 6 (six) hours as needed for wheezing or shortness of breath. 18 g 5  . albuterol (PROVENTIL) (2.5 MG/3ML) 0.083% nebulizer solution Take 3 mLs (2.5 mg total) by nebulization every 6 (six) hours as needed for wheezing or shortness of breath. 150 mL 5  . aspirin 81 MG EC tablet Take 81 mg by mouth daily.      [name] atorvastatin (LIPITOR) 20 MG tablet Take 1 tablet (20 mg total) by mouth daily. 90 tablet 3  . budesonide-formoterol (SYMBICORT) 160-4.5 MCG/ACT inhaler Inhale 2 puffs into the lungs 2 (two) times daily. 3 Inhaler 3  . escitalopram (LEXAPRO) 20 MG tablet Take 1 tablet (20 mg total) by mouth daily. 90 tablet 1  . fexofenadine (ALLEGRA) 180 MG tablet Take 180 mg by mouth daily.      [name] gabapentin (NEURONTIN) 300 MG capsule Take 300 mg by mouth 3 (three) times daily.    . montelukast (SINGULAIR) 10 MG tablet Take 1 tablet (10 mg total) by mouth at bedtime. 30 tablet 11  . tapentadol (NUCYNTA) 50 MG TABS tablet Take 50 mg by mouth daily.     . Tiotropium Bromide Monohydrate (SPIRIVA RESPIMAT) 2.5 MCG/ACT AERS 2 puffs  each am 1 Inhaler 11  . benzonatate (TESSALON) 100 MG capsule 1-2 tabs by mouth every 6 hrs as needed for cough (Patient not taking: Reported on 07/17/2015) 60 capsule 0  . levofloxacin (LEVAQUIN) 250 MG tablet Take 1 tablet (250 mg total) by mouth daily. (Patient not taking: Reported on 07/17/2015) 10 tablet 0  . predniSONE (DELTASONE) 10 MG tablet 3 tabs by mouth per day for 3 days,2tabs per day for 3 days,1tab per day for 3 days (Patient not taking: Reported on 07/17/2015) 18 tablet 0   No current facility-administered medications on file prior to visit.     Review of Systems  Constitutional: Negative for unusual  diaphoresis or night sweats HENT: Negative for ringing in ear or discharge Eyes: Negative for double vision or worsening visual disturbance.  Respiratory: Negative for choking and stridor.   Gastrointestinal: Negative for vomiting or other signifcant bowel change Genitourinary: Negative for hematuria or change in urine volume.  Musculoskeletal: Negative for other MSK pain or swelling Skin: Negative for color change and worsening wound.  Neurological: Negative for tremors and numbness other than noted  Psychiatric/Behavioral: Negative for decreased concentration or agitation other than above       Objective:   Physical Exam BP 104/58 mmHg  Pulse 91  Temp(Src) 98.3 F (36.8 C) (Oral)  Ht '5\' 1"'$  (1.549 m)  Wt 112 lb (50.803 kg)  BMI 21.17 kg/m2  SpO2 95% VS noted, nontoxic Constitutional: Pt appears in no significant distress HENT: Head: NCAT.  Right Ear: External ear normal.  Left Ear: External ear normal.  Eyes: . Pupils are equal, round, and reactive to light. Conjunctivae and EOM are normal Neck: Normal range of motion. Neck supple.  Cardiovascular: Normal rate and regular rhythm.   Pulmonary/Chest: Effort normal and breath sounds decresedwithout rales but with trace insp and mild exp bialt wheezing.  MSK: nontender costal margins, no swelling or rahs Neurological: Pt is alert. Not confused , motor grossly intact Skin: Skin is warm. No rash, no LE edema Psychiatric: Pt behavior is normal. No agitation. not depressed affect    Assessment & Plan:

## 2015-07-17 NOTE — Assessment & Plan Note (Signed)
Better, then worse again, for depomedrol IM, predpac asd, cont'd inhalers, empiric zpack, and CXR due to pain

## 2015-07-17 NOTE — Patient Instructions (Signed)
You had the steroid shot today  Please take all new medication as prescribed - the antibiotic, and prednisone  Please continue all other medications as before, including your inhaler  Please have the pharmacy call with any other refills you may need.  Please continue your efforts at being more active, low cholesterol diet, and weight control.  Please keep your appointments with your specialists as you may have planned  Please go to the XRAY Department in the Basement (go straight as you get off the elevator) for the x-ray testing  You will be contacted by phone if any changes need to be made immediately.  Otherwise, you will receive a letter about your results with an explanation, but please check with MyChart first.  Please remember to sign up for MyChart if you have not done so, as this will be important to you in the future with finding out test results, communicating by private email, and scheduling acute appointments online when needed.

## 2015-07-23 NOTE — Assessment & Plan Note (Signed)
Atypical, declines ecg, also for cxr assoc with above,  to f/u any worsening symptoms or concerns

## 2015-07-23 NOTE — Assessment & Plan Note (Signed)
stable overall by history and exam, recent data reviewed with pt, and pt to continue medical treatment as before,  to f/u any worsening symptoms or concerns, meds to continue

## 2015-07-23 NOTE — Assessment & Plan Note (Signed)
stable overall by history and exam, recent data reviewed with pt, and pt to continue medical treatment as before,  to f/u any worsening symptoms or concerns Lab Results  Component Value Date   WBC 7.2 07/31/2014   HGB 15.7* 07/31/2014   HCT 48.1* 07/31/2014   PLT 191.0 07/31/2014   GLUCOSE 88 07/31/2014   CHOL 198 07/31/2014   TRIG 86.0 07/31/2014   HDL 67.30 07/31/2014   LDLDIRECT 164.4 07/19/2013   LDLCALC 114* 07/31/2014   ALT 16 07/31/2014   AST 20 07/31/2014   NA 144 07/31/2014   K 4.0 07/31/2014   CL 106 07/31/2014   CREATININE 0.5 07/31/2014   BUN 23 07/31/2014   CO2 29 07/31/2014   TSH 1.59 07/31/2014   HGBA1C 5.6 08/01/2011    

## 2015-07-24 DIAGNOSIS — H2512 Age-related nuclear cataract, left eye: Secondary | ICD-10-CM | POA: Diagnosis not present

## 2015-07-25 DIAGNOSIS — R0902 Hypoxemia: Secondary | ICD-10-CM | POA: Diagnosis not present

## 2015-07-25 DIAGNOSIS — J449 Chronic obstructive pulmonary disease, unspecified: Secondary | ICD-10-CM | POA: Diagnosis not present

## 2015-08-09 ENCOUNTER — Other Ambulatory Visit (INDEPENDENT_AMBULATORY_CARE_PROVIDER_SITE_OTHER): Payer: Medicare Other

## 2015-08-09 DIAGNOSIS — E785 Hyperlipidemia, unspecified: Secondary | ICD-10-CM

## 2015-08-09 DIAGNOSIS — Z0000 Encounter for general adult medical examination without abnormal findings: Secondary | ICD-10-CM

## 2015-08-09 DIAGNOSIS — Z0189 Encounter for other specified special examinations: Secondary | ICD-10-CM

## 2015-08-09 LAB — CBC WITH DIFFERENTIAL/PLATELET
BASOS PCT: 0.5 % (ref 0.0–3.0)
Basophils Absolute: 0 10*3/uL (ref 0.0–0.1)
EOS PCT: 1.1 % (ref 0.0–5.0)
Eosinophils Absolute: 0.1 10*3/uL (ref 0.0–0.7)
HEMATOCRIT: 46.9 % — AB (ref 36.0–46.0)
HEMOGLOBIN: 15.5 g/dL — AB (ref 12.0–15.0)
LYMPHS PCT: 19.2 % (ref 12.0–46.0)
Lymphs Abs: 1.8 10*3/uL (ref 0.7–4.0)
MCHC: 33.2 g/dL (ref 30.0–36.0)
MCV: 95.3 fl (ref 78.0–100.0)
Monocytes Absolute: 0.6 10*3/uL (ref 0.1–1.0)
Monocytes Relative: 6 % (ref 3.0–12.0)
NEUTROS ABS: 6.9 10*3/uL (ref 1.4–7.7)
Neutrophils Relative %: 73.2 % (ref 43.0–77.0)
Platelets: 223 10*3/uL (ref 150.0–400.0)
RBC: 4.92 Mil/uL (ref 3.87–5.11)
RDW: 13.5 % (ref 11.5–15.5)
WBC: 9.5 10*3/uL (ref 4.0–10.5)

## 2015-08-09 LAB — BASIC METABOLIC PANEL
BUN: 16 mg/dL (ref 6–23)
CO2: 35 mEq/L — ABNORMAL HIGH (ref 19–32)
CREATININE: 0.62 mg/dL (ref 0.40–1.20)
Calcium: 9.9 mg/dL (ref 8.4–10.5)
Chloride: 100 mEq/L (ref 96–112)
GFR: 100.87 mL/min (ref >60.00)
Glucose, Bld: 73 mg/dL (ref 70–99)
POTASSIUM: 3.9 meq/L (ref 3.5–5.1)
Sodium: 140 mEq/L (ref 135–145)

## 2015-08-09 LAB — URINALYSIS, ROUTINE W REFLEX MICROSCOPIC
Bilirubin Urine: NEGATIVE
Hgb urine dipstick: NEGATIVE
KETONES UR: NEGATIVE
Leukocytes, UA: NEGATIVE
NITRITE: NEGATIVE
Total Protein, Urine: NEGATIVE
Urine Glucose: NEGATIVE
Urobilinogen, UA: 0.2 (ref 0.0–1.0)
pH: 6 (ref 5.0–8.0)

## 2015-08-09 LAB — HEPATIC FUNCTION PANEL
ALT: 15 U/L (ref 0–35)
AST: 19 U/L (ref 0–37)
Albumin: 4.2 g/dL (ref 3.5–5.2)
Alkaline Phosphatase: 57 U/L (ref 39–117)
BILIRUBIN DIRECT: 0.1 mg/dL (ref 0.0–0.3)
BILIRUBIN TOTAL: 0.6 mg/dL (ref 0.2–1.2)
Total Protein: 6.7 g/dL (ref 6.0–8.3)

## 2015-08-09 LAB — LIPID PANEL
CHOL/HDL RATIO: 2
Cholesterol: 222 mg/dL — ABNORMAL HIGH (ref 0–200)
HDL: 89.5 mg/dL (ref >39.00)
LDL CALC: 114 mg/dL — AB (ref 0–99)
NONHDL: 132.79
TRIGLYCERIDES: 95 mg/dL (ref 0.0–149.0)
VLDL: 19 mg/dL (ref 0.0–40.0)

## 2015-08-09 LAB — TSH: TSH: 0.88 u[IU]/mL (ref 0.35–4.50)

## 2015-08-10 ENCOUNTER — Ambulatory Visit (INDEPENDENT_AMBULATORY_CARE_PROVIDER_SITE_OTHER): Payer: Medicare Other | Admitting: Neurology

## 2015-08-10 ENCOUNTER — Encounter: Payer: Self-pay | Admitting: Neurology

## 2015-08-10 VITALS — BP 102/66 | HR 92 | Ht 61.0 in | Wt 117.0 lb

## 2015-08-10 DIAGNOSIS — E785 Hyperlipidemia, unspecified: Secondary | ICD-10-CM

## 2015-08-10 DIAGNOSIS — H8110 Benign paroxysmal vertigo, unspecified ear: Secondary | ICD-10-CM | POA: Diagnosis not present

## 2015-08-10 DIAGNOSIS — G3184 Mild cognitive impairment, so stated: Secondary | ICD-10-CM

## 2015-08-10 MED ORDER — DONEPEZIL HCL 10 MG PO TABS: ORAL_TABLET | ORAL | Status: DC

## 2015-08-10 NOTE — Patient Instructions (Signed)
1. We will check if B12 can be added on to your bloodwork 2. Refer to Vestibular Therapy for vertigo 3. Start Aricept '10mg'$ : Take 1/2 tablet daily for 2 weeks, then increase to 1 tablet daily 4. Follow-up in 8 months, call for any problems 5. Control of BP, cholesterol, as well as physical exercise and brain stimulation exercises are important for brain health
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
## 2015-08-14 ENCOUNTER — Other Ambulatory Visit (INDEPENDENT_AMBULATORY_CARE_PROVIDER_SITE_OTHER): Payer: Medicare Other

## 2015-08-14 ENCOUNTER — Ambulatory Visit (INDEPENDENT_AMBULATORY_CARE_PROVIDER_SITE_OTHER): Payer: Medicare Other | Admitting: Internal Medicine

## 2015-08-14 ENCOUNTER — Encounter: Payer: Self-pay | Admitting: Internal Medicine

## 2015-08-14 VITALS — BP 118/82 | HR 76 | Temp 97.3°F | Resp 20 | Ht 61.0 in | Wt 117.5 lb

## 2015-08-14 DIAGNOSIS — Z86010 Personal history of colonic polyps: Secondary | ICD-10-CM | POA: Diagnosis not present

## 2015-08-14 DIAGNOSIS — F329 Major depressive disorder, single episode, unspecified: Secondary | ICD-10-CM

## 2015-08-14 DIAGNOSIS — J441 Chronic obstructive pulmonary disease with (acute) exacerbation: Secondary | ICD-10-CM | POA: Diagnosis not present

## 2015-08-14 DIAGNOSIS — G3184 Mild cognitive impairment, so stated: Secondary | ICD-10-CM | POA: Diagnosis not present

## 2015-08-14 DIAGNOSIS — Z0000 Encounter for general adult medical examination without abnormal findings: Secondary | ICD-10-CM

## 2015-08-14 DIAGNOSIS — F32A Depression, unspecified: Secondary | ICD-10-CM

## 2015-08-14 LAB — VITAMIN B12: Vitamin B-12: 278 pg/mL (ref 211–911)

## 2015-08-14 MED ORDER — METHYLPREDNISOLONE ACETATE 80 MG/ML IJ SUSP
80.0000 mg | Freq: Once | INTRAMUSCULAR | Status: AC
  Administered 2015-08-14: 80 mg via INTRAMUSCULAR

## 2015-08-14 MED ORDER — PREDNISONE 10 MG PO TABS: ORAL_TABLET | ORAL | Status: DC

## 2015-08-14 MED ORDER — BUDESONIDE-FORMOTEROL FUMARATE 160-4.5 MCG/ACT IN AERO: 2.0000 | INHALATION_SPRAY | Freq: Two times a day (BID) | RESPIRATORY_TRACT | Status: DC

## 2015-08-14 NOTE — Assessment & Plan Note (Signed)
Mild, incidental - for depomedrol IM, predpac asd,m  to f/u any worsening symptoms or concerns

## 2015-08-14 NOTE — Assessment & Plan Note (Signed)
Also due for f/u colonoscopy  - to be ordered

## 2015-08-14 NOTE — Assessment & Plan Note (Signed)

## 2015-08-14 NOTE — Progress Notes (Signed)
Subjective:    Patient ID: Michele Martin, female    DOB: 1946/03/12, 70 y.o.   MRN: [id number]  HPI  Here for wellness and f/u;  Overall doing ok;  Pt denies Chest pain, orthopnea, PND, worsening LE edema, palpitations, dizziness or syncope, but has incidentally 3 days onset mild worsening nonprod cough, wheezing,sob/doe, despite good med compliance.  Needs symbicort inhaler.  Denies  Neurologcal change such as new headache, facial or extremity weakness.  Pt denies polydipsia, polyuria, or low sugar symptoms. Pt states overall good compliance with treatment and medications, good tolerability, and has been trying to follow appropriate diet.  Pt denies worsening depressive symptoms, suicidal ideation or panic. No fever, night sweats, wt loss, loss of appetite, or other constitutional symptoms.  Pt states good ability with ADL's, has low fall risk, home safety reviewed and adequate, no other significant changes in hearing or vision, and only occasionally active with exercise.  Has seen neuro with [facility], asks for b12 today.  Due for Hep c as well.  Due for f/u colonoscopy Past Medical History  Diagnosis Date  . HYPERLIPIDEMIA 09/19/2009  . ANXIETY 04/15/2010  . DEPRESSION 09/19/2009  . Acute angle-closure glaucoma 02/14/2010  . ALLERGIC RHINITIS 09/19/2009  . CHRONIC OBSTRUCTIVE PULMONARY DISEASE, ACUTE EXACERBATION 12/19/2009  . ASTHMA 09/19/2009  . COPD 06/27/2009  . BACK PAIN 02/14/2010  . TREMOR 02/14/2010  . HOARSENESS 10/17/2009  . Wheezing 06/27/2009  . MENOPAUSAL DISORDER 08/20/2010  . TINNITUS 09/10/2010  . URI 09/10/2010  . OSTEOPENIA 09/10/2010  . Chronic bronchitis 04/14/2011  . Rotator cuff tear 06/23/2011  . Tremor 07/30/2011  . Arthritis   . Complication of anesthesia   . Osteoporosis, unspecified 04/20/2014   Past Surgical History  Procedure Laterality Date  . Normal heart cath  2001    Dr. Johnsie Cancel  . Abdominal hysterectomy    . S/p laser tx for glaucoma      no vision loss  .  Cervical fusion 2013 - dr Vertell Limber      reports that she has been smoking Cigarettes.  She started smoking about 6 weeks ago. She has a 40 pack-year smoking history. She has never used smokeless tobacco. She reports that she does not drink alcohol or use illicit drugs. family history includes Dementia in her mother; Rheum arthritis in her maternal uncle. There is no history of Colon cancer. Allergies  Allergen Reactions  . Pravastatin     REACTION: itch and leg cramp  . Simvastatin     REACTION: itch and leg cramps  . Statins    Current Outpatient Prescriptions on File Prior to Visit  Medication Sig Dispense Refill  . albuterol (PROVENTIL HFA) 108 (90 BASE) MCG/ACT inhaler Inhale 2 puffs into the lungs every 6 (six) hours as needed for wheezing or shortness of breath. 18 g 5  . albuterol (PROVENTIL) (2.5 MG/3ML) 0.083% nebulizer solution Take 3 mLs (2.5 mg total) by nebulization every 6 (six) hours as needed for wheezing or shortness of breath. 150 mL 5  . aspirin 81 MG EC tablet Take 81 mg by mouth daily.      [name] atorvastatin (LIPITOR) 20 MG tablet Take 1 tablet (20 mg total) by mouth daily. 90 tablet 3  . donepezil (ARICEPT) 10 MG tablet Take 1/2 tablet daily for 2 weeks, then increase to 1 tablet daily 30 tablet 11  . escitalopram (LEXAPRO) 20 MG tablet Take 1 tablet (20 mg total) by mouth daily. 90 tablet 1  . fexofenadine (  ALLEGRA) 180 MG tablet Take 180 mg by mouth daily.      [name] gabapentin (NEURONTIN) 300 MG capsule Take 300 mg by mouth 3 (three) times daily.    . montelukast (SINGULAIR) 10 MG tablet Take 1 tablet (10 mg total) by mouth at bedtime. 30 tablet 11  . tapentadol (NUCYNTA) 50 MG TABS tablet Take 50 mg by mouth daily.     . Tiotropium Bromide Monohydrate (SPIRIVA RESPIMAT) 2.5 MCG/ACT AERS 2 puffs each am 1 Inhaler 11  . levofloxacin (LEVAQUIN) 250 MG tablet Take 1 tablet (250 mg total) by mouth daily. (Patient not taking: Reported on 08/14/2015) 10 tablet 0   No current  facility-administered medications on file prior to visit.   Review of Systems Constitutional: Negative for increased diaphoresis, other activity, appetite or siginficant weight change other than noted HENT: Negative for worsening hearing loss, ear pain, facial swelling, mouth sores and neck stiffness.   Eyes: Negative for other worsening pain, redness or visual disturbance.  Respiratory: Negative for shortness of breath and wheezing  Cardiovascular: Negative for chest pain and palpitations.  Gastrointestinal: Negative for diarrhea, blood in stool, abdominal distention or other pain Genitourinary: Negative for hematuria, flank pain or change in urine volume.  Musculoskeletal: Negative for myalgias or other joint complaints.  Skin: Negative for color change and wound or drainage.  Neurological: Negative for syncope and numbness. other than noted Hematological: Negative for adenopathy. or other swelling Psychiatric/Behavioral: Negative for hallucinations, SI, self-injury, decreased concentration or other worsening agitation.      Objective:   Physical Exam BP 118/82 mmHg  Pulse 76  Temp(Src) 97.3 F (36.3 C) (Oral)  Resp 20  Ht '5\' 1"'$  (1.549 m)  Wt 117 lb 8 oz (53.298 kg)  BMI 22.21 kg/m2  SpO2 96%  VS noted, non toxic Constitutional: Pt is oriented to person, place, and time. Appears well-developed and well-nourished, in no significant distress Head: Normocephalic and atraumatic.  Right Ear: External ear normal.  Left Ear: External ear normal.  Nose: Nose normal.  Mouth/Throat: Oropharynx is clear and moist.  Eyes: Conjunctivae and EOM are normal. Pupils are equal, round, and reactive to light.  Neck: Normal range of motion. Neck supple. No JVD present. No tracheal deviation present or significant neck LA or mass Cardiovascular: Normal rate, regular rhythm, normal heart sounds and intact distal pulses.   Pulmonary/Chest: Effort normal and breath sounds decresed without rales, but  bilat mild wheezes Abdominal: Soft. Bowel sounds are normal. NT. No HSM  Musculoskeletal: Normal range of motion. Exhibits no edema.  Lymphadenopathy:  Has no cervical adenopathy.  Neurological: Pt is alert and oriented to person, place, and time. Pt has normal reflexes. No cranial nerve deficit. Motor grossly intact Skin: Skin is warm and dry. No rash noted.  Psychiatric:  Has normal mood and affect. Behavior is normal.      Assessment & Plan:

## 2015-08-14 NOTE — Assessment & Plan Note (Signed)
stable overall by history and exam, recent data reviewed with pt, and pt to continue medical treatment as before,  to f/u any worsening symptoms or concerns Lab Results  Component Value Date   WBC 9.5 08/09/2015   HGB 15.5* 08/09/2015   HCT 46.9* 08/09/2015   PLT 223.0 08/09/2015   GLUCOSE 73 08/09/2015   CHOL 222* 08/09/2015   TRIG 95.0 08/09/2015   HDL 89.50 08/09/2015   LDLDIRECT 164.4 07/19/2013   LDLCALC 114* 08/09/2015   ALT 15 08/09/2015   AST 19 08/09/2015   NA 140 08/09/2015   K 3.9 08/09/2015   CL 100 08/09/2015   CREATININE 0.62 08/09/2015   BUN 16 08/09/2015   CO2 35* 08/09/2015   TSH 0.88 08/09/2015   HGBA1C 5.6 08/01/2011

## 2015-08-14 NOTE — Assessment & Plan Note (Signed)
On new aricept, for b12 level today

## 2015-08-14 NOTE — Patient Instructions (Signed)
You had the steroid shot today  Please take all new medication as prescribed - the prednisone  Please continue all other medications as before, and refills have been done if requested.  Please have the pharmacy call with any other refills you may need.  Please continue your efforts at being more active, low cholesterol diet, and weight control.  You are otherwise up to date with prevention measures today.  Please keep your appointments with your specialists as you may have planned  You will be contacted regarding the referral for: colonoscopy  Please go to the LAB in the Basement (turn left off the elevator) for the tests to be done today - just the B12 and Hep C testing today  You will be contacted by phone if any changes need to be made immediately.  Otherwise, you will receive a letter about your results with an explanation, but please check with MyChart first.  Please remember to sign up for MyChart if you have not done so, as this will be important to you in the future with finding out test results, communicating by private email, and scheduling acute appointments online when needed.  Please return in 6 months, or sooner if needed

## 2015-08-14 NOTE — Progress Notes (Signed)
Pre visit review using our clinic review tool, if applicable. No additional management support is needed unless otherwise documented below in the visit note. 

## 2015-08-14 NOTE — Assessment & Plan Note (Signed)
With mild ldl elevation, for lower chol diet

## 2015-08-15 ENCOUNTER — Encounter: Payer: Self-pay | Admitting: Gastroenterology

## 2015-08-15 LAB — HEPATITIS C ANTIBODY: HCV AB: NEGATIVE

## 2015-08-25 DIAGNOSIS — R0902 Hypoxemia: Secondary | ICD-10-CM | POA: Diagnosis not present

## 2015-08-25 DIAGNOSIS — J449 Chronic obstructive pulmonary disease, unspecified: Secondary | ICD-10-CM | POA: Diagnosis not present

## 2015-08-29 ENCOUNTER — Ambulatory Visit (AMBULATORY_SURGERY_CENTER): Payer: Self-pay | Admitting: *Deleted

## 2015-08-29 VITALS — Ht 61.0 in | Wt 117.0 lb

## 2015-08-29 DIAGNOSIS — Z86010 Personal history of colonic polyps: Secondary | ICD-10-CM

## 2015-08-29 DIAGNOSIS — R42 Dizziness and giddiness: Secondary | ICD-10-CM | POA: Insufficient documentation

## 2015-08-29 DIAGNOSIS — D329 Benign neoplasm of meninges, unspecified: Secondary | ICD-10-CM | POA: Insufficient documentation

## 2015-08-29 MED ORDER — NA SULFATE-K SULFATE-MG SULF 17.5-3.13-1.6 GM/177ML PO SOLN: ORAL | Status: DC

## 2015-08-29 NOTE — Progress Notes (Signed)
Patient denies any allergies to eggs or soy. Patient denies any problems with anesthesia/sedation. Patient denies any oxygen use at home and does not take any diet/weight loss medications. Pt declined EMMI education.

## 2015-08-30 DIAGNOSIS — H2512 Age-related nuclear cataract, left eye: Secondary | ICD-10-CM | POA: Diagnosis not present

## 2015-08-30 DIAGNOSIS — H268 Other specified cataract: Secondary | ICD-10-CM | POA: Diagnosis not present

## 2015-09-04 ENCOUNTER — Other Ambulatory Visit: Payer: Self-pay | Admitting: Acute Care

## 2015-09-04 DIAGNOSIS — F17210 Nicotine dependence, cigarettes, uncomplicated: Principal | ICD-10-CM

## 2015-09-07 ENCOUNTER — Ambulatory Visit (INDEPENDENT_AMBULATORY_CARE_PROVIDER_SITE_OTHER): Payer: Medicare Other | Admitting: Internal Medicine

## 2015-09-07 ENCOUNTER — Encounter: Payer: Self-pay | Admitting: Internal Medicine

## 2015-09-07 ENCOUNTER — Ambulatory Visit (INDEPENDENT_AMBULATORY_CARE_PROVIDER_SITE_OTHER)
Admission: RE | Admit: 2015-09-07 | Discharge: 2015-09-07 | Disposition: A | Discharge status: Home / Self Care | Payer: Medicare Other | Source: Ambulatory Visit | Attending: Internal Medicine | Admitting: Internal Medicine

## 2015-09-07 VITALS — BP 136/62 | HR 85 | Temp 98.1°F | Ht 61.0 in | Wt 117.2 lb

## 2015-09-07 DIAGNOSIS — J441 Chronic obstructive pulmonary disease with (acute) exacerbation: Secondary | ICD-10-CM | POA: Diagnosis not present

## 2015-09-07 DIAGNOSIS — F411 Generalized anxiety disorder: Secondary | ICD-10-CM | POA: Diagnosis not present

## 2015-09-07 DIAGNOSIS — R05 Cough: Secondary | ICD-10-CM

## 2015-09-07 DIAGNOSIS — R059 Cough, unspecified: Secondary | ICD-10-CM | POA: Insufficient documentation

## 2015-09-07 MED ORDER — CEFTRIAXONE SODIUM 1 G IJ SOLR
1.0000 g | Freq: Once | INTRAMUSCULAR | Status: AC
  Administered 2015-09-07: 1 g via INTRAMUSCULAR

## 2015-09-07 MED ORDER — PREDNISONE 10 MG PO TABS: ORAL_TABLET | ORAL | Status: DC

## 2015-09-07 MED ORDER — LEVOFLOXACIN 250 MG PO TABS: 250.0000 mg | ORAL_TABLET | Freq: Every day | ORAL | Status: DC

## 2015-09-07 MED ORDER — METHYLPREDNISOLONE ACETATE 80 MG/ML IJ SUSP
80.0000 mg | Freq: Once | INTRAMUSCULAR | Status: AC
  Administered 2015-09-07: 80 mg via INTRAMUSCULAR

## 2015-09-07 MED ORDER — HYDROCODONE-HOMATROPINE 5-1.5 MG/5ML PO SYRP: 5.0000 mL | ORAL_SOLUTION | Freq: Four times a day (QID) | ORAL | Status: DC | PRN

## 2015-09-07 NOTE — Progress Notes (Signed)
Subjective:    Patient ID: Michele Martin, female    DOB: [birth year], 70 y.o.   MRN: [id number]  HPI  Here with 2 wks gradually onset worsening now prod cough, white/occas green sputum, ? Low grade temp, and sob with wheezing but Pt denies chest pain, orthopnea, PND, increased LE swelling, palpitations, dizziness or syncope.  Last cxr dec 2016 neg for acute.  Has hx of mult copd exac in past.  Has been using nebs frequently but now not helping as well.  Wondering about the aricept since the increase of 5 to 10 mg seemed to coincide with onset symtpoms.  Pt denies new neurological symptoms such as new headache, or facial or extremity weakness or numbness   Pt denies polydipsia, polyuria  Due for f/u CT chest low dose for lung ca screening soon  Denies worsening depressive symptoms, suicidal ideation, or panic Past Medical History  Diagnosis Date  . HYPERLIPIDEMIA 09/19/2009  . ANXIETY 04/15/2010  . DEPRESSION 09/19/2009  . Acute angle-closure glaucoma 02/14/2010  . ALLERGIC RHINITIS 09/19/2009  . CHRONIC OBSTRUCTIVE PULMONARY DISEASE, ACUTE EXACERBATION 12/19/2009  . ASTHMA 09/19/2009  . COPD 06/27/2009  . BACK PAIN 02/14/2010  . TREMOR 02/14/2010  . HOARSENESS 10/17/2009  . Wheezing 06/27/2009  . MENOPAUSAL DISORDER 08/20/2010  . TINNITUS 09/10/2010  . URI 09/10/2010  . OSTEOPENIA 09/10/2010  . Chronic bronchitis 04/14/2011  . Rotator cuff tear 06/23/2011  . Tremor 07/30/2011  . Arthritis   . Complication of anesthesia   . Osteoporosis, unspecified 04/20/2014   Past Surgical History  Procedure Laterality Date  . Normal heart cath  2001    Dr. Johnsie Cancel  . Abdominal hysterectomy    . S/p laser tx for glaucoma      no vision loss  . Cervical fusion 2013 - dr Vertell Limber      reports that she has been smoking Cigarettes.  She started smoking about 2 months ago. She has a 40 pack-year smoking history. She has never used smokeless tobacco. She reports that she does not drink alcohol or use illicit  drugs. family history includes Dementia in her mother; Rheum arthritis in her maternal uncle. There is no history of Colon cancer. Allergies  Allergen Reactions  . Pravastatin     REACTION: itch and leg cramp  . Simvastatin     REACTION: itch and leg cramps  . Statins Itching   Review of Systems  Constitutional: Negative for unusual diaphoresis or night sweats HENT: Negative for ringing in ear or discharge Eyes: Negative for double vision or worsening visual disturbance.  Respiratory: Negative for choking and stridor.   Gastrointestinal: Negative for vomiting or other signifcant bowel change Genitourinary: Negative for hematuria or change in urine volume.  Musculoskeletal: Negative for other MSK pain or swelling Skin: Negative for color change and worsening wound.  Neurological: Negative for tremors and numbness other than noted  Psychiatric/Behavioral: Negative for decreased concentration or agitation other than above       Objective:   Physical Exam BP 136/62 mmHg  Pulse 85  Temp(Src) 98.1 F (36.7 C) (Oral)  Ht '5\' 1"'$  (1.549 m)  Wt 117 lb 4 oz (53.184 kg)  BMI 22.17 kg/m2  SpO2 94% VS noted, mild ill  Constitutional: Pt appears in no significant distress without accessory muscle use HENT: Head: NCAT.  Right Ear: External ear normal.  Left Ear: External ear normal.  Eyes: . Pupils are equal, round, and reactive to light. Conjunctivae and EOM are normal Neck:  Normal range of motion. Neck supple.  Cardiovascular: Normal rate and regular rhythm.   Pulmonary/Chest: Effort normal and breath sounds decreased bilat without rales but diffuse scattered wheezing.  Neurological: Pt is alert. Not confused , motor grossly intact Skin: Skin is warm. No rash, no LE edema Psychiatric: Pt behavior is normal. No agitation. mild nervous only     Assessment & Plan:

## 2015-09-07 NOTE — Patient Instructions (Addendum)
You had the antibiotic, and steroid shots today  Please take all new medication as prescribed - the pill antibiotics, cough medicine if needed, and prednisone  Please continue all other medications as before, including your inhalers and nebulizers  Please have the pharmacy call with any other refills you may need.  Please keep your appointments with your specialists as you may have planned  Please go to the XRAY Department in the Basement (go straight as you get off the elevator) for the x-ray testing  You will be contacted by phone if any changes need to be made immediately.  Otherwise, you will receive a letter about your results with an explanation, but please check with MyChart first.  Please remember to sign up for MyChart if you have not done so, as this will be important to you in the future with finding out test results, communicating by private email, and scheduling acute appointments online when needed.

## 2015-09-07 NOTE — Addendum Note (Signed)
Addended by: Aviva Signs M on: 09/07/2015 03:30 PM   Modules accepted: Orders

## 2015-09-07 NOTE — Assessment & Plan Note (Signed)
Mild to mod, for depomedrol IM, predpac asd, cough med prn, cxr today, and  to f/u any worsening symptoms or concerns

## 2015-09-07 NOTE — Assessment & Plan Note (Signed)
Mild to mod, c/w bronchtis vs pna, for rocephin 1 gm IM, levaquin asd, also cxr and  to f/u any worsening symptoms or concerns

## 2015-09-07 NOTE — Assessment & Plan Note (Signed)
stable overall by history and exam, and pt to continue medical treatment as before,  to f/u any worsening symptoms or concerns. Ok to cont the aricept at 10 mg

## 2015-09-07 NOTE — Progress Notes (Signed)
Pre visit review using our clinic review tool, if applicable. No additional management support is needed unless otherwise documented below in the visit note. 

## 2015-09-10 ENCOUNTER — Telehealth: Payer: Self-pay | Admitting: Gastroenterology

## 2015-09-10 DIAGNOSIS — M4316 Spondylolisthesis, lumbar region: Secondary | ICD-10-CM | POA: Diagnosis not present

## 2015-09-10 DIAGNOSIS — M5417 Radiculopathy, lumbosacral region: Secondary | ICD-10-CM | POA: Diagnosis not present

## 2015-09-10 NOTE — Telephone Encounter (Signed)
Left a message for patient to call back. 

## 2015-09-11 NOTE — Telephone Encounter (Signed)
Patient did not return call.

## 2015-09-12 ENCOUNTER — Encounter: Payer: Medicare Other | Admitting: Gastroenterology

## 2015-09-24 ENCOUNTER — Other Ambulatory Visit: Payer: Self-pay | Admitting: Internal Medicine

## 2015-09-25 DIAGNOSIS — R0902 Hypoxemia: Secondary | ICD-10-CM | POA: Diagnosis not present

## 2015-09-25 DIAGNOSIS — J449 Chronic obstructive pulmonary disease, unspecified: Secondary | ICD-10-CM | POA: Diagnosis not present

## 2015-09-26 ENCOUNTER — Telehealth: Payer: Self-pay | Admitting: Internal Medicine

## 2015-09-26 NOTE — Telephone Encounter (Signed)
Patient Name: Michele Martin DOB: March 28, 1946 Initial Comment Caller states she's having breathing problems- She has COPD. Nurse Assessment Nurse: Vallery Sa, RN, Cathy Date/Time (Eastern Time): 09/26/2015 8:48:52 AM Confirm and document reason for call. If symptomatic, describe symptoms. You must click the next button to save text entered. ---Caller states she has COPD and developed increased breathing difficulty about 2 days ago that she thought was related to allergies. Her SaO2 is 93 at this time. No blueness around her lips. Has the patient traveled out of the country within the last 30 days? ---No Does the patient have any new or worsening symptoms? ---Yes Will a triage be completed? ---Yes Related visit to physician within the last 2 weeks? ---No Does the PT have any chronic conditions? (i.e. diabetes, asthma, etc.) ---Yes List chronic conditions. ---COPD, Allergies Is this a behavioral health or substance abuse call? ---No Guidelines Guideline Title Affirmed Question Affirmed Notes COPD Oxygen Monitoring and Hypoxia [1] MODERATE difficulty breathing (e.g., speaks in phrases, SOB even at rest) AND [2] worse than normal Final Disposition User Go to ED Now (or PCP triage) Vallery Sa, RN, [facility] - ED Disagree/Comply: Comply

## 2015-09-27 ENCOUNTER — Ambulatory Visit: Payer: Medicare Other

## 2015-10-03 ENCOUNTER — Ambulatory Visit (INDEPENDENT_AMBULATORY_CARE_PROVIDER_SITE_OTHER): Payer: Medicare Other | Admitting: Internal Medicine

## 2015-10-03 ENCOUNTER — Ambulatory Visit (INDEPENDENT_AMBULATORY_CARE_PROVIDER_SITE_OTHER)
Admission: RE | Admit: 2015-10-03 | Discharge: 2015-10-03 | Disposition: A | Discharge status: Home / Self Care | Payer: Medicare Other | Source: Ambulatory Visit | Attending: Internal Medicine | Admitting: Internal Medicine

## 2015-10-03 VITALS — BP 118/78 | HR 94 | Temp 99.0°F | Resp 20 | Wt 118.0 lb

## 2015-10-03 DIAGNOSIS — J42 Unspecified chronic bronchitis: Secondary | ICD-10-CM | POA: Diagnosis not present

## 2015-10-03 DIAGNOSIS — J441 Chronic obstructive pulmonary disease with (acute) exacerbation: Secondary | ICD-10-CM

## 2015-10-03 DIAGNOSIS — R05 Cough: Secondary | ICD-10-CM | POA: Diagnosis not present

## 2015-10-03 DIAGNOSIS — R0602 Shortness of breath: Secondary | ICD-10-CM | POA: Diagnosis not present

## 2015-10-03 DIAGNOSIS — R079 Chest pain, unspecified: Secondary | ICD-10-CM | POA: Diagnosis not present

## 2015-10-03 MED ORDER — AMOXICILLIN-POT CLAVULANATE 875-125 MG PO TABS: 1.0000 | ORAL_TABLET | Freq: Two times a day (BID) | ORAL | Status: DC

## 2015-10-03 MED ORDER — PREDNISONE 10 MG PO TABS: ORAL_TABLET | ORAL | Status: DC

## 2015-10-03 MED ORDER — TIOTROPIUM BROMIDE MONOHYDRATE 2.5 MCG/ACT IN AERS: INHALATION_SPRAY | RESPIRATORY_TRACT | Status: DC

## 2015-10-03 MED ORDER — HYDROCODONE-HOMATROPINE 5-1.5 MG/5ML PO SYRP: 5.0000 mL | ORAL_SOLUTION | Freq: Four times a day (QID) | ORAL | Status: DC | PRN

## 2015-10-03 NOTE — Progress Notes (Signed)
Subjective:    Patient ID: Michele Martin, female    DOB: 1946/04/19, 70 y.o.   MRN: [id number]  HPI  Here with acute onset mild to mod 3-4 days ST, HA, general weakness and malaise, with prod cough greenish sputum, assoc with diffuse dull chest tightness without radation, diaphoresis, n/v, palp, dizziness or synceop. but Pt denies orthopnea, PND, increased LE swelling.    Past Medical History  Diagnosis Date  . HYPERLIPIDEMIA 09/19/2009  . ANXIETY 04/15/2010  . DEPRESSION 09/19/2009  . Acute angle-closure glaucoma 02/14/2010  . ALLERGIC RHINITIS 09/19/2009  . CHRONIC OBSTRUCTIVE PULMONARY DISEASE, ACUTE EXACERBATION 12/19/2009  . ASTHMA 09/19/2009  . COPD 06/27/2009  . BACK PAIN 02/14/2010  . TREMOR 02/14/2010  . HOARSENESS 10/17/2009  . Wheezing 06/27/2009  . MENOPAUSAL DISORDER 08/20/2010  . TINNITUS 09/10/2010  . URI 09/10/2010  . OSTEOPENIA 09/10/2010  . Chronic bronchitis 04/14/2011  . Rotator cuff tear 06/23/2011  . Tremor 07/30/2011  . Arthritis   . Complication of anesthesia   . Osteoporosis, unspecified 04/20/2014   Past Surgical History  Procedure Laterality Date  . Normal heart cath  2001    Dr. Johnsie Cancel  . Abdominal hysterectomy    . S/p laser tx for glaucoma      no vision loss  . Cervical fusion 2013 - dr Vertell Limber      reports that she has been smoking Cigarettes.  She started smoking about 3 months ago. She has a 40 pack-year smoking history. She has never used smokeless tobacco. She reports that she does not drink alcohol or use illicit drugs. family history includes Dementia in her mother; Rheum arthritis in her maternal uncle. There is no history of Colon cancer. Allergies  Allergen Reactions  . Pravastatin     REACTION: itch and leg cramp  . Simvastatin     REACTION: itch and leg cramps  . Statins Itching   Current Outpatient Prescriptions on File Prior to Visit  Medication Sig Dispense Refill  . albuterol (PROVENTIL HFA) 108 (90 BASE) MCG/ACT inhaler Inhale 2  puffs into the lungs every 6 (six) hours as needed for wheezing or shortness of breath. 18 g 5  . albuterol (PROVENTIL) (2.5 MG/3ML) 0.083% nebulizer solution Take 3 mLs (2.5 mg total) by nebulization every 6 (six) hours as needed for wheezing or shortness of breath. 150 mL 5  . aspirin 81 MG EC tablet Take 81 mg by mouth daily.      [name] atorvastatin (LIPITOR) 20 MG tablet Take 10 mg by mouth daily.    [name] atorvastatin (LIPITOR) 20 MG tablet TAKE 1 TABLET BY MOUTH DAILY 90 tablet 0  . budesonide-formoterol (SYMBICORT) 160-4.5 MCG/ACT inhaler Inhale 2 puffs into the lungs 2 (two) times daily. 3 Inhaler 3  . donepezil (ARICEPT) 10 MG tablet Take 1/2 tablet daily for 2 weeks, then increase to 1 tablet daily 30 tablet 11  . escitalopram (LEXAPRO) 20 MG tablet Take 1 tablet (20 mg total) by mouth daily. 90 tablet 1  . fexofenadine (ALLEGRA) 180 MG tablet Take 180 mg by mouth daily.      [name] gabapentin (NEURONTIN) 300 MG capsule Take 300 mg by mouth 3 (three) times daily.    [name] levofloxacin (LEVAQUIN) 250 MG tablet Take 1 tablet (250 mg total) by mouth daily. 10 tablet 0  . montelukast (SINGULAIR) 10 MG tablet Take 1 tablet (10 mg total) by mouth at bedtime. 30 tablet 11  . Na Sulfate-K Sulfate-Mg Sulf SOLN Suprep (no substitutions)-TAKE  AS DIRECTED. 354 mL 0  . tapentadol (NUCYNTA) 50 MG TABS tablet Take 50 mg by mouth daily.      No current facility-administered medications on file prior to visit.   Review of Systems  Constitutional: Negative for unusual diaphoresis or night sweats HENT: Negative for ringing in ear or discharge Eyes: Negative for double vision or worsening visual disturbance.  Respiratory: Negative for choking and stridor.   Gastrointestinal: Negative for vomiting or other signifcant bowel change Genitourinary: Negative for hematuria or change in urine volume.  Musculoskeletal: Negative for other MSK pain or swelling Skin: Negative for color change and worsening wound.  Neurological:  Negative for tremors and numbness other than noted  Psychiatric/Behavioral: Negative for decreased concentration or agitation other than above       Objective:   Physical Exam BP 118/78 mmHg  Pulse 94  Temp(Src) 99 F (37.2 C) (Oral)  Resp 20  Wt 118 lb (53.524 kg)  SpO2 95% VS noted, mild ill Constitutional: Pt appears in no significant distress HENT: Head: NCAT.  Right Ear: External ear normal.  Left Ear: External ear normal.  Bilat tm's with mild erythema.  Max sinus areas non tender.  Pharynx with mild erythema, no exudate Eyes: . Pupils are equal, round, and reactive to light. Conjunctivae and EOM are normal Neck: Normal range of motion. Neck supple.  Cardiovascular: Normal rate and regular rhythm.   Pulmonary/Chest: Effort normal and breath sounds without rales but moderately decreased bilat with mod insp wheezing.  Abd:  Soft, NT, ND, + BS Neurological: Pt is alert. Not confused , motor grossly intact Skin: Skin is warm. No rash, no LE edema Psychiatric: Pt behavior is normal. No agitation.   ECG reviewed as per emr    Assessment & Plan:

## 2015-10-03 NOTE — Patient Instructions (Addendum)
Your EKG was OK today  You had the steroid shot today  Please take all new medication as prescribed - the antibiotic, cough medicine, and prednisone  Please continue all other medications as before, and refills have been done if requested.  Please have the pharmacy call with any other refills you may need.  Please continue your efforts at being more active, low cholesterol diet, and weight control.  Please keep your appointments with your specialists as you may have planned  Your CXR was done today  You will be contacted by phone if any changes need to be made immediately.  Otherwise, you will receive a letter about your results with an explanation, but please check with MyChart first.  Please remember to sign up for MyChart if you have not done so, as this will be important to you in the future with finding out test results, communicating by private email, and scheduling acute appointments online when needed.  Your [facility] Patient Assist was done today

## 2015-10-03 NOTE — Progress Notes (Signed)
Pre visit review using our clinic review tool, if applicable. No additional management support is needed unless otherwise documented below in the visit note. 

## 2015-10-03 NOTE — Assessment & Plan Note (Addendum)
ECG reviewed as per emr, very low suspicion for cardiac, likely related to pulm,  to f/u any worsening symptoms or concerns, for cxr as well

## 2015-10-05 ENCOUNTER — Telehealth: Payer: Self-pay | Admitting: Gastroenterology

## 2015-10-05 NOTE — Telephone Encounter (Signed)
No that's okay, if she has an illness don't charge her. Thanks

## 2015-10-07 NOTE — Assessment & Plan Note (Signed)
With flare, Mild to mod, for antibx course,  For antibx asd, to f/u any worsening symptoms or concerns

## 2015-10-07 NOTE — Assessment & Plan Note (Signed)
Mild to mod, for depomedrol IM, predpac asd,,  to f/u any worsening symptoms or concerns 

## 2015-10-08 ENCOUNTER — Inpatient Hospital Stay: Admission: RE | Admit: 2015-10-08 | Payer: Medicare Other | Source: Ambulatory Visit

## 2015-10-09 ENCOUNTER — Encounter: Payer: Medicare Other | Admitting: Gastroenterology

## 2015-10-22 ENCOUNTER — Telehealth: Payer: Self-pay | Admitting: Internal Medicine

## 2015-10-22 NOTE — Telephone Encounter (Signed)
Patient Name: Michele Martin DOB: [birth year] Initial Comment Caller states has HX of COPD, having sob and tightness - was given ABX and Steroid 2 weeks ago for this and still having problems Nurse Assessment Nurse: Ronnald Ramp, RN, Miranda Date/Time (Eastern Time): 10/22/2015 10:20:26 AM Confirm and document reason for call. If symptomatic, describe symptoms. You must click the next button to save text entered. ---Caller states she was seen by MD 3 weeks ago and prescribed steroids and antibiotics. She is still having symptoms. Today she is having SOB. Albuterol 2-3 times a day (has not had any today. Has the patient traveled out of the country within the last 30 days? ---No Does the patient have any new or worsening symptoms? ---Yes Will a triage be completed? ---Yes Related visit to physician within the last 2 weeks? ---No Does the PT have any chronic conditions? (i.e. diabetes, asthma, etc.) ---Yes List chronic conditions. ---COPD Is this a behavioral health or substance abuse call? ---No Guidelines Guideline Title Affirmed Question Affirmed Notes COPD Oxygen Monitoring and Hypoxia [1] MILD difficulty breathing (e.g., minimal/no SOB at rest, SOB with walking) AND [2] worse than normal Final Disposition User See Physician within 24 Hours Ronnald Ramp, RN, Miranda Comments Appt scheduled for tomorrow 10/23/15 at 5:15pm with Dr. Cathlean Cower Referrals REFERRED TO PCP OFFICE Disagree/Comply: Comply

## 2015-10-23 ENCOUNTER — Encounter: Payer: Self-pay | Admitting: Internal Medicine

## 2015-10-23 ENCOUNTER — Ambulatory Visit (INDEPENDENT_AMBULATORY_CARE_PROVIDER_SITE_OTHER): Payer: Medicare Other | Admitting: Internal Medicine

## 2015-10-23 VITALS — BP 122/78 | HR 86 | Temp 98.6°F | Resp 20 | Wt 118.0 lb

## 2015-10-23 DIAGNOSIS — J441 Chronic obstructive pulmonary disease with (acute) exacerbation: Secondary | ICD-10-CM

## 2015-10-23 DIAGNOSIS — T783XXA Angioneurotic edema, initial encounter: Secondary | ICD-10-CM

## 2015-10-23 DIAGNOSIS — R0902 Hypoxemia: Secondary | ICD-10-CM | POA: Diagnosis not present

## 2015-10-23 DIAGNOSIS — R079 Chest pain, unspecified: Secondary | ICD-10-CM

## 2015-10-23 DIAGNOSIS — J449 Chronic obstructive pulmonary disease, unspecified: Secondary | ICD-10-CM | POA: Diagnosis not present

## 2015-10-23 MED ORDER — HYDROCODONE-HOMATROPINE 5-1.5 MG/5ML PO SYRP: 5.0000 mL | ORAL_SOLUTION | Freq: Four times a day (QID) | ORAL | Status: DC | PRN

## 2015-10-23 MED ORDER — AZITHROMYCIN 250 MG PO TABS: ORAL_TABLET | ORAL | Status: DC

## 2015-10-23 MED ORDER — PREDNISONE 10 MG PO TABS: ORAL_TABLET | ORAL | Status: DC

## 2015-10-23 NOTE — Progress Notes (Signed)
Subjective:    Patient ID: Michele Martin, female    DOB: 1946-03-02, 70 y.o.   MRN: [id number]  HPI  Here to f/u, states improved over last visit but "not quite all the way".  Was seen mar 8 with copd exacerbation/cough/CP requiring antibx, cough med and steroid taper.  CXR neg for acute.  Today with mild worsening in the past few days of chest tightness/wheezing and increased WOB, persistent cough though non prod and only mildly increased, no fever and does feel "ill" as before, Pt denies chest pain except for scattered fleeting sharp and dull pains throughout torso, orthopnea, PND, increased LE swelling, palpitations, dizziness or syncope. Has been spending more time outside due to the warm weather in last few days.  Also face is diffusely puffy and swelling worse about the eyes and maxillary sinus area but not painful, tender or fever.  No worsening sinus pain, congestion or drainage. Past Medical History  Diagnosis Date  . HYPERLIPIDEMIA 09/19/2009  . ANXIETY 04/15/2010  . DEPRESSION 09/19/2009  . Acute angle-closure glaucoma 02/14/2010  . ALLERGIC RHINITIS 09/19/2009  . CHRONIC OBSTRUCTIVE PULMONARY DISEASE, ACUTE EXACERBATION 12/19/2009  . ASTHMA 09/19/2009  . COPD 06/27/2009  . BACK PAIN 02/14/2010  . TREMOR 02/14/2010  . HOARSENESS 10/17/2009  . Wheezing 06/27/2009  . MENOPAUSAL DISORDER 08/20/2010  . TINNITUS 09/10/2010  . URI 09/10/2010  . OSTEOPENIA 09/10/2010  . Chronic bronchitis 04/14/2011  . Rotator cuff tear 06/23/2011  . Tremor 07/30/2011  . Arthritis   . Complication of anesthesia   . Osteoporosis, unspecified 04/20/2014   Past Surgical History  Procedure Laterality Date  . Normal heart cath  2001    Dr. Johnsie Cancel  . Abdominal hysterectomy    . S/p laser tx for glaucoma      no vision loss  . Cervical fusion 2013 - dr Vertell Limber      reports that she has been smoking Cigarettes.  She started smoking about 3 months ago. She has a 40 pack-year smoking history. She has never used  smokeless tobacco. She reports that she does not drink alcohol or use illicit drugs. family history includes Dementia in her mother; Rheum arthritis in her maternal uncle. There is no history of Colon cancer. Allergies  Allergen Reactions  . Pravastatin     REACTION: itch and leg cramp  . Simvastatin     REACTION: itch and leg cramps  . Statins Itching   Current Outpatient Prescriptions on File Prior to Visit  Medication Sig Dispense Refill  . albuterol (PROVENTIL HFA) 108 (90 BASE) MCG/ACT inhaler Inhale 2 puffs into the lungs every 6 (six) hours as needed for wheezing or shortness of breath. 18 g 5  . albuterol (PROVENTIL) (2.5 MG/3ML) 0.083% nebulizer solution Take 3 mLs (2.5 mg total) by nebulization every 6 (six) hours as needed for wheezing or shortness of breath. 150 mL 5  . amoxicillin-clavulanate (AUGMENTIN) 875-125 MG tablet Take 1 tablet by mouth 2 (two) times daily. 20 tablet 0  . aspirin 81 MG EC tablet Take 81 mg by mouth daily.      [name] atorvastatin (LIPITOR) 20 MG tablet Take 10 mg by mouth daily.    [name] atorvastatin (LIPITOR) 20 MG tablet TAKE 1 TABLET BY MOUTH DAILY 90 tablet 0  . budesonide-formoterol (SYMBICORT) 160-4.5 MCG/ACT inhaler Inhale 2 puffs into the lungs 2 (two) times daily. 3 Inhaler 3  . donepezil (ARICEPT) 10 MG tablet Take 1/2 tablet daily for 2 weeks, then increase to  1 tablet daily 30 tablet 11  . escitalopram (LEXAPRO) 20 MG tablet Take 1 tablet (20 mg total) by mouth daily. 90 tablet 1  . fexofenadine (ALLEGRA) 180 MG tablet Take 180 mg by mouth daily.      [name] gabapentin (NEURONTIN) 300 MG capsule Take 300 mg by mouth 3 (three) times daily.    . montelukast (SINGULAIR) 10 MG tablet Take 1 tablet (10 mg total) by mouth at bedtime. 30 tablet 11  . Na Sulfate-K Sulfate-Mg Sulf SOLN Suprep (no substitutions)-TAKE AS DIRECTED. 354 mL 0  . tapentadol (NUCYNTA) 50 MG TABS tablet Take 50 mg by mouth daily.     . Tiotropium Bromide Monohydrate (SPIRIVA RESPIMAT) 2.5  MCG/ACT AERS 2 puffs each am 3 Inhaler 3   No current facility-administered medications on file prior to visit.   Review of Systems  Constitutional: Negative for unusual diaphoresis or night sweats HENT: Negative for ear swelling or discharge Eyes: Negative for worsening visual haziness  Respiratory: Negative for choking and stridor.   Gastrointestinal: Negative for distension or worsening eructation Genitourinary: Negative for retention or change in urine volume.  Musculoskeletal: Negative for other MSK pain or swelling Skin: Negative for color change and worsening wound Neurological: Negative for tremors and numbness other than noted  Psychiatric/Behavioral: Negative for decreased concentration or agitation other than above       Objective:   Physical Exam BP 122/78 mmHg  Pulse 86  Temp(Src) 98.6 F (37 C) (Oral)  Resp 20  Wt 118 lb (53.524 kg)  SpO2 90% VS noted, less "ill" appearing as last visit Constitutional: Pt appears in no apparent distress HENT: Head: NCAT.  Right Ear: External ear normal.  Left Ear: External ear normal.  Left tm's with mild erythema.  Max sinus areas non tender.  Pharynx with mild erythema, no exudate; has diffuse mild periorbital and maxillary sinus area nontender swelling Eyes: . Pupils are equal, round, and reactive to light. Conjunctivae and EOM are normal Neck: Normal range of motion. Neck supple.  Cardiovascular: Normal rate and regular rhythm.   Pulmonary/Chest: Effort normal and breath sounds decreased without rales but with few scattered wheezing.  Neurological: Pt is alert. Not confused , motor grossly intact Skin: Skin is warm. No rash, no LE edema Psychiatric: Pt behavior is normal. No agitation.     Assessment & Plan:

## 2015-10-23 NOTE — Patient Instructions (Signed)
Your condition seems more allergic than infectious today  You had the steroid shot today  Please take all new medication as prescribed  - the antibiotic, cough medicine, and prednisone  You can also take benadryl 50 mg every 6 hrs if needed, or zyrtec 10 mg per day  Please continue all other medications as before, and refills have been done if requested.  Please have the pharmacy call with any other refills you may need.  Please keep your appointments with your specialists as you may have planned

## 2015-10-23 NOTE — Progress Notes (Signed)
Pre visit review using our clinic review tool, if applicable. No additional management support is needed unless otherwise documented below in the visit note. 

## 2015-10-24 ENCOUNTER — Ambulatory Visit (INDEPENDENT_AMBULATORY_CARE_PROVIDER_SITE_OTHER)
Admission: RE | Admit: 2015-10-24 | Discharge: 2015-10-24 | Disposition: A | Discharge status: Home / Self Care | Payer: Medicare Other | Source: Ambulatory Visit | Attending: Acute Care | Admitting: Acute Care

## 2015-10-24 DIAGNOSIS — Z87891 Personal history of nicotine dependence: Secondary | ICD-10-CM | POA: Diagnosis not present

## 2015-10-24 DIAGNOSIS — F17210 Nicotine dependence, cigarettes, uncomplicated: Secondary | ICD-10-CM

## 2015-10-24 NOTE — Assessment & Plan Note (Signed)
Mentions scattered pain and achiness with sharp fleeting pains throughout chest and upper back, doubt cardiac, most likley msk related to other illness,  to f/u any worsening symptoms or concerns

## 2015-10-24 NOTE — Assessment & Plan Note (Signed)
Seems more c/w allergic component this time, but due to relatively frequent exacerbations, will tx wth depomedrol IM, predpac asd, zpack, cont cough med prn,  to f/u any worsening symptoms or concerns

## 2015-10-24 NOTE — Assessment & Plan Note (Signed)
D/w pt, likely allergic but not med related it seems, not on ACE, for depomedrol IM, predpac asd,  to f/u any worsening symptoms or concerns, benadryl 50 q 6 hrs prn,  to f/u any worsening symptoms or concerns

## 2015-10-29 ENCOUNTER — Other Ambulatory Visit: Payer: Self-pay | Admitting: Internal Medicine

## 2015-10-29 ENCOUNTER — Telehealth: Payer: Self-pay | Admitting: Gastroenterology

## 2015-10-29 NOTE — Telephone Encounter (Signed)
No that's okay. Thanks for asking

## 2015-10-31 ENCOUNTER — Encounter: Payer: Medicare Other | Admitting: Gastroenterology

## 2015-11-02 DIAGNOSIS — H01111 Allergic dermatitis of right upper eyelid: Secondary | ICD-10-CM | POA: Diagnosis not present

## 2015-11-02 DIAGNOSIS — H11421 Conjunctival edema, right eye: Secondary | ICD-10-CM | POA: Diagnosis not present

## 2015-11-02 DIAGNOSIS — H01112 Allergic dermatitis of right lower eyelid: Secondary | ICD-10-CM | POA: Diagnosis not present

## 2015-11-03 ENCOUNTER — Telehealth: Payer: Self-pay | Admitting: Acute Care

## 2015-11-03 NOTE — Telephone Encounter (Signed)
I have called both Michele Martin and today Michele Martin to give this patient her low-dose CT screening results. I left a message both times with contact information requesting the patient call for the results of her scan. I will await a call in response to the most recent message. We will call a third time at which point we will send results in a registered letter.

## 2015-11-11 ENCOUNTER — Telehealth: Payer: Self-pay | Admitting: Neurology

## 2015-11-11 NOTE — Telephone Encounter (Signed)
MRI report from [facility] reviewed:  MRI brain with and without contrast 06/20/15: No acute changes. Periventricular and subcortical T2 changes bilaterally are similar to prior study.  There is no significant change in a right paramedian anterior falx meningioma measuring 16 x 11 x 74m, meningioma calcified.

## 2015-11-23 DIAGNOSIS — J449 Chronic obstructive pulmonary disease, unspecified: Secondary | ICD-10-CM | POA: Diagnosis not present

## 2015-11-23 DIAGNOSIS — R0902 Hypoxemia: Secondary | ICD-10-CM | POA: Diagnosis not present

## 2015-11-27 ENCOUNTER — Encounter: Payer: Self-pay | Admitting: Gastroenterology

## 2015-11-27 ENCOUNTER — Ambulatory Visit (AMBULATORY_SURGERY_CENTER): Payer: Medicare Other | Admitting: Gastroenterology

## 2015-11-27 VITALS — BP 148/55 | HR 62 | Temp 97.1°F | Resp 19 | Ht 61.0 in | Wt 117.0 lb

## 2015-11-27 DIAGNOSIS — Z86010 Personal history of colonic polyps: Secondary | ICD-10-CM

## 2015-11-27 DIAGNOSIS — D122 Benign neoplasm of ascending colon: Secondary | ICD-10-CM | POA: Diagnosis not present

## 2015-11-27 DIAGNOSIS — D123 Benign neoplasm of transverse colon: Secondary | ICD-10-CM | POA: Diagnosis not present

## 2015-11-27 MED ORDER — SODIUM CHLORIDE 0.9 % IV SOLN: 500.0000 mL | INTRAVENOUS | Status: DC

## 2015-11-27 NOTE — Op Note (Signed)
[place] Patient Name: Michele Martin Procedure Date: 11/27/2015 2:02 PM MRN: [id number] Endoscopist: Remo Lipps P. Havery Moros , MD Age: 70 Date of Birth: 03-22-1946 Gender: Female Procedure:                Colonoscopy Indications:              High risk colon cancer surveillance: Personal                            history of colonic polyps Medicines:                Monitored Anesthesia Care Procedure:                Pre-Anesthesia Assessment:                           - Prior to the procedure, a History and Physical                            was performed, and patient medications and                            allergies were reviewed. The patient's tolerance of                            previous anesthesia was also reviewed. The risks                            and benefits of the procedure and the sedation                            options and risks were discussed with the patient.                            All questions were answered, and informed consent                            was obtained. Prior Anticoagulants: The patient                            last took aspirin 1 day prior to the procedure. ASA                            Grade Assessment: III - A patient with severe                            systemic disease. After reviewing the risks and                            benefits, the patient was deemed in satisfactory                            condition to undergo the procedure.  After obtaining informed consent, the colonoscope                            was passed under direct vision. Throughout the                            procedure, the patient's blood pressure, pulse, and                            oxygen saturations were monitored continuously. The                            Model CF-HQ190L [id number]) scope was introduced                            through the anus and advanced to the the terminal    ileum, with identification of the appendiceal                            orifice and IC valve. The colonoscopy was performed                            without difficulty. The patient tolerated the                            procedure well. The quality of the bowel                            preparation was adequate. The terminal ileum,                            ileocecal valve, appendiceal orifice, and rectum                            were photographed. Scope In: 1:36:52 PM Scope Out: 1:55:23 PM Scope Withdrawal Time: 0 hours 16 minutes 0 seconds  Total Procedure Duration: 0 hours 18 minutes 31 seconds  Findings:                 A 5 mm polyp was found in the ascending colon. The                            polyp was sessile. The polyp was removed with a                            cold snare. Resection and retrieval were complete.                           A 3 mm polyp was found in the hepatic flexure. The                            polyp was sessile. The polyp was removed with a  cold snare. Resection and retrieval were complete.                           A 4 mm polyp was found in the transverse colon. The                            polyp was sessile. The polyp was removed with a                            cold snare. Resection and retrieval were complete.                           A tattoo was seen in the proximal transverse colon.                            The tattoo site appeared normal. No evidence of                            residual polyp                           Anal papilla(e) were hypertrophied.                           The terminal ileum appeared normal.                           The exam was otherwise without abnormality. Complications:            No immediate complications. Estimated blood loss:                            Minimal. Estimated Blood Loss:     Estimated blood loss was minimal. Impression:               - One 5 mm polyp in the  ascending colon, removed                            with a cold snare. Resected and retrieved.                           - One 3 mm polyp at the hepatic flexure, removed                            with a cold snare. Resected and retrieved.                           - One 4 mm polyp in the transverse colon, removed                            with a cold snare. Resected and retrieved.                           - A tattoo was seen in the proximal transverse  colon. The tattoo site appeared normal.                           - Anal papilla(e) were hypertrophied.                           - The examined portion of the ileum was normal.                           - The examination was otherwise normal. Recommendation:           - Patient has a contact number available for                            emergencies. The signs and symptoms of potential                            delayed complications were discussed with the                            patient. Return to normal activities tomorrow.                            Written discharge instructions were provided to the                            patient.                           - Resume previous diet.                           - Continue present medications.                           - No aspirin, ibuprofen, naproxen, or other                            non-steroidal anti-inflammatory drugs for 2 weeks                            after polyp removal.                           - Await pathology results.                           - Repeat colonoscopy is recommended for                            surveillance. The colonoscopy date will be                            determined after pathology results from today's                            exam become available for  review. Remo Lipps P. Armbruster, MD 11/27/2015 2:08:43 PM This report has been signed electronically.

## 2015-11-27 NOTE — Progress Notes (Signed)
Called to room to assist during endoscopic procedure.  Patient ID and intended procedure confirmed with present staff. Received instructions for my participation in the procedure from the performing physician.  

## 2015-11-27 NOTE — Patient Instructions (Signed)
YOU HAD AN ENDOSCOPIC PROCEDURE TODAY AT [facility]:   Refer to the procedure report that was given to you for any specific questions about what was found during the examination.  If the procedure report does not answer your questions, please call your gastroenterologist to clarify.  If you requested that your care partner not be given the details of your procedure findings, then the procedure report has been included in a sealed envelope for you to review at your convenience later.  YOU SHOULD EXPECT: Some feelings of bloating in the abdomen. Passage of more gas than usual.  Walking can help get rid of the air that was put into your GI tract during the procedure and reduce the bloating. If you had a lower endoscopy (such as a colonoscopy or flexible sigmoidoscopy) you may notice spotting of blood in your stool or on the toilet paper. If you underwent a bowel prep for your procedure, you may not have a normal bowel movement for a few days.  Please Note:  You might notice some irritation and congestion in your nose or some drainage.  This is from the oxygen used during your procedure.  There is no need for concern and it should clear up in a day or so.  SYMPTOMS TO REPORT IMMEDIATELY:   Following lower endoscopy (colonoscopy or flexible sigmoidoscopy):  Excessive amounts of blood in the stool  Significant tenderness or worsening of abdominal pains  Swelling of the abdomen that is new, acute  Fever of 100F or higher  For urgent or emergent issues, a gastroenterologist can be reached at any hour by calling [phone].   DIET: Your first meal following the procedure should be a small meal and then it is ok to progress to your normal diet. Heavy or fried foods are harder to digest and may make you feel nauseous or bloated.  Likewise, meals heavy in dairy and vegetables can increase bloating.  Drink plenty of fluids but you should avoid alcoholic beverages for 24  hours.  ACTIVITY:  You should plan to take it easy for the rest of today and you should NOT DRIVE or use heavy machinery until tomorrow (because of the sedation medicines used during the test).    FOLLOW UP: Our staff will call the number listed on your records the next business day following your procedure to check on you and address any questions or concerns that you may have regarding the information given to you following your procedure. If we do not reach you, we will leave a message.  However, if you are feeling well and you are not experiencing any problems, there is no need to return our call.  We will assume that you have returned to your regular daily activities without incident.  If any biopsies were taken you will be contacted by phone or by letter within the next 1-3 weeks.  Please call us at [phone] if you have not heard about the biopsies in 3 weeks.    SIGNATURES/CONFIDENTIALITY: You and/or your care partner have signed paperwork which will be entered into your electronic medical record.  These signatures attest to the fact that that the information above on your After Visit Summary has been reviewed and is understood.  Full responsibility of the confidentiality of this discharge information lies with you and/or your care-partner.  No aspirin, ibuprofen, naproxen, aleve, or other non-steroidal anti-inflammatory drugs for 2 weeks after polyp removal.  Please review polyp handout provided.

## 2015-11-27 NOTE — Progress Notes (Signed)
A/ox3 pleased with MAC, report to Wendy RN 

## 2015-11-28 ENCOUNTER — Telehealth: Payer: Self-pay | Admitting: *Deleted

## 2015-11-28 NOTE — Telephone Encounter (Signed)
  Follow up Call-  Call back number 11/27/2015  Post procedure Call Back phone  # [phone]  Permission to leave phone message Yes   [facility]

## 2015-11-29 DIAGNOSIS — M4316 Spondylolisthesis, lumbar region: Secondary | ICD-10-CM | POA: Diagnosis not present

## 2015-11-29 DIAGNOSIS — M5417 Radiculopathy, lumbosacral region: Secondary | ICD-10-CM | POA: Diagnosis not present

## 2015-12-03 ENCOUNTER — Encounter: Payer: Self-pay | Admitting: Gastroenterology

## 2015-12-19 ENCOUNTER — Encounter: Payer: Self-pay | Admitting: Internal Medicine

## 2015-12-19 ENCOUNTER — Ambulatory Visit (INDEPENDENT_AMBULATORY_CARE_PROVIDER_SITE_OTHER): Payer: Medicare Other | Admitting: Internal Medicine

## 2015-12-19 VITALS — BP 136/74 | HR 93 | Temp 98.6°F | Resp 20 | Wt 114.0 lb

## 2015-12-19 DIAGNOSIS — J441 Chronic obstructive pulmonary disease with (acute) exacerbation: Secondary | ICD-10-CM

## 2015-12-19 DIAGNOSIS — J309 Allergic rhinitis, unspecified: Secondary | ICD-10-CM | POA: Diagnosis not present

## 2015-12-19 DIAGNOSIS — F32A Depression, unspecified: Secondary | ICD-10-CM

## 2015-12-19 DIAGNOSIS — F329 Major depressive disorder, single episode, unspecified: Secondary | ICD-10-CM | POA: Diagnosis not present

## 2015-12-19 MED ORDER — PREDNISONE 10 MG PO TABS: ORAL_TABLET | ORAL | Status: DC

## 2015-12-19 NOTE — Progress Notes (Signed)
Subjective:    Patient ID: Michele Martin, female    DOB: August 27, 1945, 70 y.o.   MRN: [id number]  HPI  Here to f/u "early" with recurrent exacerbation, last visit x 2 mo with angioedema, resolved but now with 2-3 days onset increasd prod cough clear sputum, mild wheeze and sob/doe. No fever, and Pt denies chest pain, increased sob or doe, wheezing, orthopnea, PND, increased LE swelling, palpitations, dizziness or syncope except for the above.  Does have good inhaler compliance.  Last seen pulmonary oct 2016.  Pt denies new neurological symptoms such as new headache, or facial or extremity weakness or numbness   Pt denies polydipsia, polyuria,  Cont's to smoke, not interested in quitting. Denies worsening depressive symptoms, suicidal ideation, or panic; has ongoing anxiety Past Medical History  Diagnosis Date  . HYPERLIPIDEMIA 09/19/2009  . ANXIETY 04/15/2010  . DEPRESSION 09/19/2009  . Acute angle-closure glaucoma 02/14/2010  . ALLERGIC RHINITIS 09/19/2009  . CHRONIC OBSTRUCTIVE PULMONARY DISEASE, ACUTE EXACERBATION 12/19/2009  . ASTHMA 09/19/2009  . COPD 06/27/2009  . BACK PAIN 02/14/2010  . TREMOR 02/14/2010  . HOARSENESS 10/17/2009  . Wheezing 06/27/2009  . MENOPAUSAL DISORDER 08/20/2010  . TINNITUS 09/10/2010  . URI 09/10/2010  . OSTEOPENIA 09/10/2010  . Chronic bronchitis 04/14/2011  . Rotator cuff tear 06/23/2011  . Tremor 07/30/2011  . Arthritis   . Complication of anesthesia   . Osteoporosis, unspecified 04/20/2014   Past Surgical History  Procedure Laterality Date  . Normal heart cath  2001    Dr. Johnsie Cancel  . Abdominal hysterectomy    . S/p laser tx for glaucoma      no vision loss  . Cervical fusion 2013 - dr Vertell Limber      reports that she has quit smoking. Her smoking use included Cigarettes. She started smoking about 5 months ago. She has a 40 pack-year smoking history. She has never used smokeless tobacco. She reports that she does not drink alcohol or use illicit drugs. family  history includes Dementia in her mother; Rheum arthritis in her maternal uncle. There is no history of Colon cancer. Allergies  Allergen Reactions  . Pravastatin     REACTION: itch and leg cramp  . Simvastatin     REACTION: itch and leg cramps  . Statins Itching   Current Outpatient Prescriptions on File Prior to Visit  Medication Sig Dispense Refill  . albuterol (PROVENTIL HFA) 108 (90 BASE) MCG/ACT inhaler Inhale 2 puffs into the lungs every 6 (six) hours as needed for wheezing or shortness of breath. 18 g 5  . albuterol (PROVENTIL) (2.5 MG/3ML) 0.083% nebulizer solution Take 3 mLs (2.5 mg total) by nebulization every 6 (six) hours as needed for wheezing or shortness of breath. 150 mL 5  . aspirin 81 MG EC tablet Take 81 mg by mouth daily.      [name] atorvastatin (LIPITOR) 20 MG tablet Take 10 mg by mouth daily.    . budesonide-formoterol (SYMBICORT) 160-4.5 MCG/ACT inhaler Inhale 2 puffs into the lungs 2 (two) times daily. 3 Inhaler 3  . donepezil (ARICEPT) 10 MG tablet Take 1/2 tablet daily for 2 weeks, then increase to 1 tablet daily 30 tablet 11  . escitalopram (LEXAPRO) 20 MG tablet Take 1 tablet (20 mg total) by mouth daily. 90 tablet 1  . fexofenadine (ALLEGRA) 180 MG tablet Take 180 mg by mouth daily.      [name] gabapentin (NEURONTIN) 300 MG capsule Take 300 mg by mouth 3 (three) times daily.    [name]  montelukast (SINGULAIR) 10 MG tablet TAKE 1 TABLET BY MOUTH AT BEDTIME 30 tablet 3  . tapentadol (NUCYNTA) 50 MG TABS tablet Take 50 mg by mouth daily.      No current facility-administered medications on file prior to visit.   Review of Systems  Constitutional: Negative for unusual diaphoresis or night sweats HENT: Negative for ear swelling or discharge Eyes: Negative for worsening visual haziness  Respiratory: Negative for choking and stridor.   Gastrointestinal: Negative for distension or worsening eructation Genitourinary: Negative for retention or change in urine volume.    Musculoskeletal: Negative for other MSK pain or swelling Skin: Negative for color change and worsening wound Neurological: Negative for tremors and numbness other than noted  Psychiatric/Behavioral: Negative for decreased concentration or agitation other than above       Objective:   Physical Exam BP 136/74 mmHg  Pulse 93  Temp(Src) 98.6 F (37 C) (Oral)  Resp 20  Wt 114 lb (51.71 kg)  SpO2 91% VS noted, non toxic, not ill appearing Constitutional: Pt appears in no apparent distress HENT: Head: NCAT.  Right Ear: External ear normal.  Left Ear: External ear normal.  Eyes: . Pupils are equal, round, and reactive to light. Conjunctivae and EOM are normal Neck: Normal range of motion. Neck supple.  Cardiovascular: Normal rate and regular rhythm.   Pulmonary/Chest: Effort normal and breath sounds decreased bilat without rales but with few scattered wheezing Nurological: Pt is alert. Not confused , motor grossly intact Skin: Skin is warm. No rash, no LE edema Psychiatric: Pt behavior is normal. No agitation. not depressed affdct, mild nervous  Lab Results  Component Value Date   WBC 9.5 08/09/2015   HGB 15.5* 08/09/2015   HCT 46.9* 08/09/2015   PLT 223.0 08/09/2015   GLUCOSE 73 08/09/2015   CHOL 222* 08/09/2015   TRIG 95.0 08/09/2015   HDL 89.50 08/09/2015   LDLDIRECT 164.4 07/19/2013   LDLCALC 114* 08/09/2015   ALT 15 08/09/2015   AST 19 08/09/2015   NA 140 08/09/2015   K 3.9 08/09/2015   CL 100 08/09/2015   CREATININE 0.62 08/09/2015   BUN 16 08/09/2015   CO2 35* 08/09/2015   TSH 0.88 08/09/2015   HGBA1C 5.6 08/01/2011       Assessment & Plan:

## 2015-12-19 NOTE — Patient Instructions (Signed)
You had the steroid shot today  Please take all new medication as prescribed - the prednisone  Please continue all other medications as before, and refills have been done if requested.  Please have the pharmacy call with any other refills you may need  Please keep your appointments with your specialists as you may have planned     

## 2015-12-19 NOTE — Assessment & Plan Note (Signed)
stable overall by history and exam, recent data reviewed with pt, and pt to continue medical treatment as before,  to f/u any worsening symptoms or concerns Lab Results  Component Value Date   WBC 9.5 08/09/2015   HGB 15.5* 08/09/2015   HCT 46.9* 08/09/2015   PLT 223.0 08/09/2015   GLUCOSE 73 08/09/2015   CHOL 222* 08/09/2015   TRIG 95.0 08/09/2015   HDL 89.50 08/09/2015   LDLDIRECT 164.4 07/19/2013   LDLCALC 114* 08/09/2015   ALT 15 08/09/2015   AST 19 08/09/2015   NA 140 08/09/2015   K 3.9 08/09/2015   CL 100 08/09/2015   CREATININE 0.62 08/09/2015   BUN 16 08/09/2015   CO2 35* 08/09/2015   TSH 0.88 08/09/2015   HGBA1C 5.6 08/01/2011

## 2015-12-19 NOTE — Assessment & Plan Note (Signed)
Mild to mod, for depomedrol IM, predpac asd, to f/u any worsening symptoms or concerns 

## 2015-12-19 NOTE — Assessment & Plan Note (Signed)
stable overall by history and exam, and pt to continue medical treatment as before,  to f/u any worsening symptoms or concerns 

## 2015-12-19 NOTE — Progress Notes (Signed)
Pre visit review using our clinic review tool, if applicable. No additional management support is needed unless otherwise documented below in the visit note. 

## 2015-12-23 DIAGNOSIS — J449 Chronic obstructive pulmonary disease, unspecified: Secondary | ICD-10-CM | POA: Diagnosis not present

## 2015-12-23 DIAGNOSIS — R0902 Hypoxemia: Secondary | ICD-10-CM | POA: Diagnosis not present

## 2015-12-27 ENCOUNTER — Encounter: Payer: Self-pay | Admitting: Family Medicine

## 2015-12-29 ENCOUNTER — Other Ambulatory Visit: Payer: Self-pay | Admitting: Internal Medicine

## 2016-01-11 ENCOUNTER — Other Ambulatory Visit: Payer: Self-pay | Admitting: Internal Medicine

## 2016-01-22 DIAGNOSIS — M4316 Spondylolisthesis, lumbar region: Secondary | ICD-10-CM | POA: Diagnosis not present

## 2016-01-23 DIAGNOSIS — R0902 Hypoxemia: Secondary | ICD-10-CM | POA: Diagnosis not present

## 2016-01-23 DIAGNOSIS — J449 Chronic obstructive pulmonary disease, unspecified: Secondary | ICD-10-CM | POA: Diagnosis not present

## 2016-02-12 ENCOUNTER — Ambulatory Visit (INDEPENDENT_AMBULATORY_CARE_PROVIDER_SITE_OTHER): Payer: Medicare Other | Admitting: Internal Medicine

## 2016-02-12 VITALS — BP 140/80 | HR 112 | Temp 98.0°F | Resp 20 | Wt 110.0 lb

## 2016-02-12 DIAGNOSIS — F32A Depression, unspecified: Secondary | ICD-10-CM

## 2016-02-12 DIAGNOSIS — E785 Hyperlipidemia, unspecified: Secondary | ICD-10-CM | POA: Diagnosis not present

## 2016-02-12 DIAGNOSIS — G894 Chronic pain syndrome: Secondary | ICD-10-CM | POA: Diagnosis not present

## 2016-02-12 DIAGNOSIS — F329 Major depressive disorder, single episode, unspecified: Secondary | ICD-10-CM | POA: Diagnosis not present

## 2016-02-12 DIAGNOSIS — Z0001 Encounter for general adult medical examination with abnormal findings: Secondary | ICD-10-CM | POA: Diagnosis not present

## 2016-02-12 DIAGNOSIS — R6889 Other general symptoms and signs: Secondary | ICD-10-CM

## 2016-02-12 MED ORDER — BUDESONIDE-FORMOTEROL FUMARATE 160-4.5 MCG/ACT IN AERO: 2.0000 | INHALATION_SPRAY | Freq: Two times a day (BID) | RESPIRATORY_TRACT | Status: DC

## 2016-02-12 MED ORDER — GABAPENTIN 100 MG PO CAPS: 100.0000 mg | ORAL_CAPSULE | Freq: Three times a day (TID) | ORAL | Status: DC

## 2016-02-12 NOTE — Progress Notes (Signed)
   Subjective:    Patient ID: Michele Martin, female    DOB: 09/25/1945, 70 y.o.   MRN: [id number]  HPI  Here to f/u; overall doing ok,  Pt denies chest pain, increasing sob or doe, wheezing, orthopnea, PND, increased LE swelling, palpitations, dizziness or syncope.  Pt denies new neurological symptoms such as new headache, or facial or extremity weakness or numbness.  Pt denies polydipsia, polyuria, or low sugar episode.   Pt denies new neurological symptoms such as new headache, or facial or extremity weakness or numbness.   Pt states overall good compliance with meds, mostly trying to follow appropriate diet, with wt overall stable,  but little exercise however.   Wt Readings from Last 3 Encounters:  02/12/16 110 lb (49.896 kg)  12/19/15 114 lb (51.71 kg)  11/27/15 117 lb (53.071 kg)   BP Readings from Last 3 Encounters:  02/12/16 140/80  12/19/15 136/74  11/27/15 148/55  Pt continues to have recurring LBP without change in severity, bowel or bladder change, fever, wt loss,  worsening LE pain/numbness/weakness, gait change or falls, but  To see pain psychiatrist regarding part the process of clearance to be eligible for stimulator.  Has seen pain management but pain overall much worse in last fe months, worse to walk, has known lumbar spinal stenosis.  States pain chronic 8/10 , worse to walk, and current tx not working. Thinking of trying accupuncture. Willing to try gabapentin Review of Systems  Constitutional: Negative for unusual diaphoresis or night sweats HENT: Negative for ear swelling or discharge Eyes: Negative for worsening visual haziness  Respiratory: Negative for choking and stridor.   Gastrointestinal: Negative for distension or worsening eructation Genitourinary: Negative for retention or change in urine volume.  Musculoskeletal: Negative for other MSK pain or swelling Skin: Negative for color change and worsening wound Neurological: Negative for tremors and numbness  other than noted  Psychiatric/Behavioral: Negative for decreased concentration or agitation other than above       Objective:   Physical Exam  BP 140/80 mmHg  Pulse 112  Temp(Src) 98 F (36.7 C) (Oral)  Resp 20  Wt 110 lb (49.896 kg)  SpO2 99% VS noted,  Constitutional: Pt appears in no apparent distress HENT: Head: NCAT.  Right Ear: External ear normal.  Left Ear: External ear normal.  Eyes: . Pupils are equal, round, and reactive to light. Conjunctivae and EOM are normal Neck: Normal range of motion. Neck supple.  Cardiovascular: Normal rate and regular rhythm.   Pulmonary/Chest: Effort normal and breath sounds without rales or wheezing.  Abd:  Soft, NT, ND, + BS Neurological: Pt is alert. Not confused , motor grossly intact Skin: Skin is warm. No rash, no LE edema Psychiatric: Pt behavior is normal. No agitation.   Lab Results  Component Value Date   WBC 9.5 08/09/2015   HGB 15.5* 08/09/2015   HCT 46.9* 08/09/2015   PLT 223.0 08/09/2015   GLUCOSE 73 08/09/2015   CHOL 222* 08/09/2015   TRIG 95.0 08/09/2015   HDL 89.50 08/09/2015   LDLDIRECT 164.4 07/19/2013   LDLCALC 114* 08/09/2015   ALT 15 08/09/2015   AST 19 08/09/2015   NA 140 08/09/2015   K 3.9 08/09/2015   CL 100 08/09/2015   CREATININE 0.62 08/09/2015   BUN 16 08/09/2015   CO2 35* 08/09/2015   TSH 0.88 08/09/2015   HGBA1C 5.6 08/01/2011       Assessment & Plan:

## 2016-02-12 NOTE — Assessment & Plan Note (Signed)
stable overall by history and exam, recent data reviewed with pt, and pt to continue medical treatment as before,  to f/u any worsening symptoms or concerns Lab Results  Component Value Date   LDLCALC 114* 08/09/2015

## 2016-02-12 NOTE — Progress Notes (Signed)
Pre visit review using our clinic review tool, if applicable. No additional management support is needed unless otherwise documented below in the visit note. 

## 2016-02-12 NOTE — Patient Instructions (Signed)
Please take all new medication as prescribed - the gabapentin 100 mg  Please continue all other medications as before, and refills have been done if requested.  Please have the pharmacy call with any other refills you may need.  Please keep your appointments with your specialists as you may have planned  Please return in 6 months, or sooner if needed, with Lab testing done 3-5 days before

## 2016-02-12 NOTE — Assessment & Plan Note (Signed)
Ok for trial add gabapentin asd, f/u pain management as planned

## 2016-02-12 NOTE — Assessment & Plan Note (Signed)
Possibly an element of current status, but pt denies recent worsening, no SI or HI, and declines further change in tx

## 2016-02-22 DIAGNOSIS — R0902 Hypoxemia: Secondary | ICD-10-CM | POA: Diagnosis not present

## 2016-02-22 DIAGNOSIS — J449 Chronic obstructive pulmonary disease, unspecified: Secondary | ICD-10-CM | POA: Diagnosis not present

## 2016-03-06 DIAGNOSIS — M4316 Spondylolisthesis, lumbar region: Secondary | ICD-10-CM | POA: Diagnosis not present

## 2016-03-13 ENCOUNTER — Encounter: Payer: Self-pay | Admitting: Internal Medicine

## 2016-03-13 ENCOUNTER — Ambulatory Visit (INDEPENDENT_AMBULATORY_CARE_PROVIDER_SITE_OTHER): Payer: Medicare Other | Admitting: Internal Medicine

## 2016-03-13 VITALS — BP 138/76 | HR 90 | Temp 98.0°F | Resp 20 | Wt 113.0 lb

## 2016-03-13 DIAGNOSIS — M25511 Pain in right shoulder: Secondary | ICD-10-CM | POA: Diagnosis not present

## 2016-03-13 DIAGNOSIS — M25551 Pain in right hip: Secondary | ICD-10-CM

## 2016-03-13 DIAGNOSIS — M542 Cervicalgia: Secondary | ICD-10-CM | POA: Diagnosis not present

## 2016-03-13 MED ORDER — TIZANIDINE HCL 4 MG PO TABS: 4.0000 mg | ORAL_TABLET | Freq: Four times a day (QID) | ORAL | 1 refills | Status: DC | PRN

## 2016-03-13 MED ORDER — HYDROCODONE-ACETAMINOPHEN 5-325 MG PO TABS: 1.0000 | ORAL_TABLET | Freq: Four times a day (QID) | ORAL | 0 refills | Status: DC | PRN

## 2016-03-13 MED ORDER — TIOTROPIUM BROMIDE MONOHYDRATE 18 MCG IN CAPS: 18.0000 ug | ORAL_CAPSULE | Freq: Every day | RESPIRATORY_TRACT | 12 refills | Status: DC

## 2016-03-13 NOTE — Progress Notes (Signed)
Pre visit review using our clinic review tool, if applicable. No additional management support is needed unless otherwise documented below in the visit note. 

## 2016-03-13 NOTE — Patient Instructions (Signed)
Please take all new medication as prescribed - the pain medication, and the muscle relaxer  Please continue all other medications as before, and refills have been done if requested.  Please have the pharmacy call with any other refills you may need.  Please keep your appointments with your specialists as you may have planned  You will be contacted regarding the referral for: Dr Tamala Julian

## 2016-03-13 NOTE — Progress Notes (Signed)
Subjective:    Patient ID: Michele Martin, female    DOB: May 25, 1946, 70 y.o.   MRN: [id number]  HPI  Here with several msk complaints today all on the right side, c/o right neck pain with grinding and popping with flexion and extension and horzontal movement, mild to mod, intermittent, nothing else makes better or worse x 2 wks.  Also for > 2 wks has right shoudler pain, and right lateral hip pain worse to abduction, mild to mod, intermittent, without radiation or other assoc symtpoms. Pt denies chest pain, increased sob or doe, wheezing, orthopnea, PND, increased LE swelling, palpitations, dizziness or syncope.  Pt denies new neurological symptoms such as new headache, or facial or extremity weakness or numbness  No falls or injury or joint swelling Past Medical History:  Diagnosis Date  . Acute angle-closure glaucoma 02/14/2010  . ALLERGIC RHINITIS 09/19/2009  . ANXIETY 04/15/2010  . Arthritis   . ASTHMA 09/19/2009  . BACK PAIN 02/14/2010  . Chronic bronchitis 04/14/2011  . CHRONIC OBSTRUCTIVE PULMONARY DISEASE, ACUTE EXACERBATION 12/19/2009  . Complication of anesthesia   . COPD 06/27/2009  . DEPRESSION 09/19/2009  . HOARSENESS 10/17/2009  . HYPERLIPIDEMIA 09/19/2009  . MENOPAUSAL DISORDER 08/20/2010  . OSTEOPENIA 09/10/2010  . Osteoporosis, unspecified 04/20/2014  . Rotator cuff tear 06/23/2011  . TINNITUS 09/10/2010  . TREMOR 02/14/2010  . Tremor 07/30/2011  . URI 09/10/2010  . Wheezing 06/27/2009   Past Surgical History:  Procedure Laterality Date  . ABDOMINAL HYSTERECTOMY    . cervical fusion 2013 - Dr Vertell Limber    . Normal Heart Cath  2001   Dr. Johnsie Cancel  . s/p laser tx for glaucoma     no vision loss    reports that she has quit smoking. Her smoking use included Cigarettes. She started smoking about 8 months ago. She has a 40.00 pack-year smoking history. She has never used smokeless tobacco. She reports that she does not drink alcohol or use drugs. family history includes Dementia in  her mother; Rheum arthritis in her maternal uncle. Allergies  Allergen Reactions  . Pravastatin     REACTION: itch and leg cramp  . Simvastatin     REACTION: itch and leg cramps  . Statins Itching   Current Outpatient Prescriptions on File Prior to Visit  Medication Sig Dispense Refill  . albuterol (PROVENTIL HFA) 108 (90 BASE) MCG/ACT inhaler Inhale 2 puffs into the lungs every 6 (six) hours as needed for wheezing or shortness of breath. 18 g 5  . albuterol (PROVENTIL) (2.5 MG/3ML) 0.083% nebulizer solution Take 3 mLs (2.5 mg total) by nebulization every 6 (six) hours as needed for wheezing or shortness of breath. 150 mL 5  . aspirin 81 MG EC tablet Take 81 mg by mouth daily.      [name] atorvastatin (LIPITOR) 20 MG tablet TAKE 1 TABLET BY MOUTH ONCE DAILY 90 tablet 3  . budesonide-formoterol (SYMBICORT) 160-4.5 MCG/ACT inhaler Inhale 2 puffs into the lungs 2 (two) times daily. 3 Inhaler 3  . donepezil (ARICEPT) 10 MG tablet Take 1/2 tablet daily for 2 weeks, then increase to 1 tablet daily 30 tablet 11  . escitalopram (LEXAPRO) 20 MG tablet TAKE 1 TABLET BY MOUTH ONCE DAILY 90 tablet 0  . fexofenadine (ALLEGRA) 180 MG tablet Take 180 mg by mouth daily.      [name] gabapentin (NEURONTIN) 100 MG capsule Take 1 capsule (100 mg total) by mouth 3 (three) times daily. 90 capsule 3  . montelukast (  SINGULAIR) 10 MG tablet TAKE 1 TABLET BY MOUTH AT BEDTIME 30 tablet 3   No current facility-administered medications on file prior to visit.    Review of Systems  Constitutional: Negative for unusual diaphoresis or night sweats HENT: Negative for ear swelling or discharge Eyes: Negative for worsening visual haziness  Respiratory: Negative for choking and stridor.   Gastrointestinal: Negative for distension or worsening eructation Genitourinary: Negative for retention or change in urine volume.  Musculoskeletal: Negative for other MSK pain or swelling Skin: Negative for color change and worsening  wound Neurological: Negative for tremors and numbness other than noted  Psychiatric/Behavioral: Negative for decreased concentration or agitation other than above       Objective:   Physical Exam BP 138/76   Pulse 90   Temp 98 F (36.7 C) (Oral)   Resp 20   Wt 113 lb (51.3 kg)   SpO2 90%   BMI 21.35 kg/m  VS noted,  Constitutional: Pt appears in no apparent distress HENT: Head: NCAT.  Right Ear: External ear normal.  Left Ear: External ear normal.  Eyes: . Pupils are equal, round, and reactive to light. Conjunctivae and EOM are normal Neck: Normal range of motion. Neck supple.  Cardiovascular: Normal rate and regular rhythm.   Pulmonary/Chest: Effort normal and breath sounds without rales or wheezing.  Abd:  Soft, NT, ND, + BS Neurological: Pt is alert. Not confused , motor grossly intact Skin: Skin is warm. No rash, no LE edema Psychiatric: Pt behavior is normal. No agitation.  Spine nontender Has right trapeazoid tender Also tender right subacromial and over right lateral greater trochanter    Assessment & Plan:

## 2016-03-15 NOTE — Assessment & Plan Note (Signed)
Suspect related to underlying djd or ddd, for pain control,  to f/u any worsening symptoms or concerns

## 2016-03-15 NOTE — Assessment & Plan Note (Signed)
?   Bursitis, for pain control, sport med referral

## 2016-03-15 NOTE — Assessment & Plan Note (Signed)
?   Bursitis, for pain control, sport med referrral

## 2016-03-24 DIAGNOSIS — R0902 Hypoxemia: Secondary | ICD-10-CM | POA: Diagnosis not present

## 2016-03-24 DIAGNOSIS — J449 Chronic obstructive pulmonary disease, unspecified: Secondary | ICD-10-CM | POA: Diagnosis not present

## 2016-03-26 ENCOUNTER — Other Ambulatory Visit: Payer: Self-pay | Admitting: Internal Medicine

## 2016-04-08 ENCOUNTER — Ambulatory Visit: Payer: Medicare Other | Admitting: Family Medicine

## 2016-04-14 ENCOUNTER — Ambulatory Visit: Payer: Medicare Other | Admitting: Neurology

## 2016-04-14 DIAGNOSIS — M5412 Radiculopathy, cervical region: Secondary | ICD-10-CM | POA: Diagnosis not present

## 2016-04-14 DIAGNOSIS — M5417 Radiculopathy, lumbosacral region: Secondary | ICD-10-CM | POA: Diagnosis not present

## 2016-04-14 DIAGNOSIS — M4316 Spondylolisthesis, lumbar region: Secondary | ICD-10-CM | POA: Diagnosis not present

## 2016-04-14 DIAGNOSIS — R42 Dizziness and giddiness: Secondary | ICD-10-CM | POA: Diagnosis not present

## 2016-04-14 DIAGNOSIS — M542 Cervicalgia: Secondary | ICD-10-CM | POA: Diagnosis not present

## 2016-04-16 ENCOUNTER — Ambulatory Visit (INDEPENDENT_AMBULATORY_CARE_PROVIDER_SITE_OTHER): Payer: Medicare Other | Admitting: Neurology

## 2016-04-16 ENCOUNTER — Encounter: Payer: Self-pay | Admitting: Neurology

## 2016-04-16 VITALS — BP 118/68 | HR 85 | Temp 98.1°F | Ht 61.0 in | Wt 111.2 lb

## 2016-04-16 DIAGNOSIS — G3184 Mild cognitive impairment, so stated: Secondary | ICD-10-CM | POA: Diagnosis not present

## 2016-04-16 MED ORDER — DONEPEZIL HCL 10 MG PO TABS: ORAL_TABLET | ORAL | 3 refills | Status: DC
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
## 2016-04-16 NOTE — Patient Instructions (Signed)
You look great, continue Aricept '10mg'$  daily. Control of blood pressure, cholesterol, as well as physical exercise and brain stimulation exercises are important for brain health. Follow-up in 1 year.

## 2016-04-16 NOTE — Progress Notes (Signed)
[facility] [place],  [id number] Phone: [phone] Subjective:    I'm seeing this patient by the request  of:  Cathlean Cower, MD   CC: right-sided neck and shoulder pain  IOX:[id number]  Michele Martin is a 70 y.o. female coming in with complaint of right-sided neck and shoulder pain. States that there is a grinding popping sensation in the neck especially with flexion and extension and given her head side-to-side. States that this can be accompanied with pain that is moderate in intensity. Seems to be worsening over the course last several weeks. In addition of this patient has also had right shoulder pain. Feels though that she can have the shoulder pain without neck pain.  patient states that now it seems to be both shoulders. States that it's hard to even lift above her head sometimes. When she sleeps on them she has pain. Rates the severity pain is 8 out of 10. Was in pain management and states that the pain medications and no longer helping.   Past medical history significant for an ACDF in 2013 from [facility]. Following up with neurosurgeon in the near future.    Past Medical History:  Diagnosis Date  . Acute angle-closure glaucoma 02/14/2010  . ALLERGIC RHINITIS 09/19/2009  . ANXIETY 04/15/2010  . Arthritis   . ASTHMA 09/19/2009  . BACK PAIN 02/14/2010  . Chronic bronchitis 04/14/2011  . CHRONIC OBSTRUCTIVE PULMONARY DISEASE, ACUTE EXACERBATION 12/19/2009  . Complication of anesthesia   . COPD 06/27/2009  . DEPRESSION 09/19/2009  . HOARSENESS 10/17/2009  . HYPERLIPIDEMIA 09/19/2009  . MENOPAUSAL DISORDER 08/20/2010  . OSTEOPENIA 09/10/2010  . Osteoporosis, unspecified 04/20/2014  . Rotator cuff tear 06/23/2011  . TINNITUS 09/10/2010  . TREMOR 02/14/2010  . Tremor 07/30/2011  . URI 09/10/2010  . Wheezing 06/27/2009   Past Surgical History:  Procedure Laterality Date  . ABDOMINAL HYSTERECTOMY    . cervical fusion 2013 - Dr Vertell Limber    .  Normal Heart Cath  2001   Dr. Johnsie Cancel  . s/p laser tx for glaucoma     no vision loss   Social History   Social History  . Marital status: Widowed    Spouse name: N/A  . Number of children: 2  . Years of education: N/A   Occupational History  . 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 Smoking status: Former Smoker    Packs/day: 1.00    Years: 40.00    Types: Cigarettes    Start date: 07/03/2015  . Smokeless tobacco: Never Used  . Alcohol use No  . Drug use: No  . Sexual activity: Not on file   Other Topics Concern  . Not on file   Social History Narrative  . No narrative on file   Allergies  Allergen Reactions  . Pravastatin     REACTION: itch and leg cramp  . Simvastatin     REACTION: itch and leg cramps  . Statins Itching   Family History  Problem Relation Age of Onset  . Dementia Mother   . Colon cancer Neg Hx   . Rheum arthritis Maternal Uncle     Past medical history, social, surgical and family history all reviewed in electronic medical record.  No pertanent information unless stated regarding to the chief complaint.   Review of Systems: No headache, visual changes, nausea, vomiting, diarrhea, constipation, dizziness, abdominal pain, skin rash, fevers,  chills, night sweats, weight loss, swollen lymph nodes, body aches, joint swelling, muscle aches, chest pain, shortness of breath, mood changes.   Objective  There were no vitals taken for this visit.  General: No apparent distress alert and oriented x3 mood and affect normal, dressed appropriately.  HEENT: Pupils equal, extraocular movements intact  Respiratory: Patient's speak in full sentences and does not appear short of breath  Cardiovascular: No lower extremity edema, non tender, no erythema  Skin: Warm dry intact with no signs of infection or rash on extremities or on axial skeleton.  Abdomen: Soft nontender  Neuro: Cranial nerves II  through XII are intact, neurovascularly intact in all extremities with 2+ DTRs and 2+ pulses.  Lymph: No lymphadenopathy of posterior or anterior cervical chain or axillae bilaterally.  Gait normal with good balance and coordination.  MSK:  Non tender with full range of motion and good stability and symmetric strength and tone of , elbows, wrist, hip, knee and ankles bilaterally.  Neck: Inspection  Reveals straightening with loss of lordosis No palpable stepoffs.  positive Spurling's maneuver  bilaterally.  significant limitation in range of motion with side bending, rotation and 5 of extension Grip strength and sensation normal in bilateral hands Strength good C4 to T1 distribution No sensory change to C4 to T1 Negative Hoffman sign bilaterally Reflexes normal  Shoulder:  Bilateral  significant atrophy of the shoulder girdles bilaterally. Crepitus of the shoulders bilaterally with range of motion.  rotator cuff strength 3 out of 5 on the right side in 4-5 on the left side. signs of impingement with positive Neer and Hawkin's tests, but negative empty can sign. Speeds and Yergason's tests normal. Normal scapular function observed.  positive painful arc  MSK US performed of: Right This study was ordered, performed, and interpreted by Charlann Boxer D.O.  Shoulder:   Supraspinatus:   Severe atrophy noted with full-thickness tear noted. Significant degenerative changes in the area.Charna Archer joint:   Moderate to severe arthritis Glenohumeral Joint:   Moderate to severe arthritis Biceps Tendon:  Appears normal on long and transverse views, no fraying of tendon, tendon located in intertubercular groove, no subluxation with shoulder internal or external rotation.  Impression:  Severe rotator cuff arthropathy  Procedure: Real-time Ultrasound Guided Injection of right glenohumeral joint Device: GE Logiq E  Ultrasound guided injection is preferred based studies that show increased duration,  increased effect, greater accuracy, decreased procedural pain, increased response rate with ultrasound guided versus blind injection.  Verbal informed consent obtained.  Time-out conducted.  Noted no overlying erythema, induration, or other signs of local infection.  Skin prepped in a sterile fashion.  Local anesthesia: Topical Ethyl chloride.  With sterile technique and under real time ultrasound guidance:  Joint visualized.  23g 1  inch needle inserted posterior approach. Pictures taken for needle placement. Patient did have injection of 2 cc of 1% lidocaine, 2 cc of 0.5% Marcaine, and 1.0 cc of Kenalog 40 mg/dL. Completed without difficulty  Pain immediately resolved suggesting accurate placement of the medication.  Advised to call if fevers/chills, erythema, induration, drainage, or persistent bleeding.  Images permanently stored and available for review in the ultrasound unit.  Impression: Technically successful ultrasound guided injection.    Impression and Recommendations:     This case required medical decision making of moderate complexity.      Note: This dictation was prepared with Dragon dictation along with smaller phrase technology. Any transcriptional errors that result from this process are unintentional.

## 2016-04-17 ENCOUNTER — Encounter: Payer: Self-pay | Admitting: Family Medicine

## 2016-04-17 ENCOUNTER — Other Ambulatory Visit: Payer: Self-pay

## 2016-04-17 ENCOUNTER — Ambulatory Visit (INDEPENDENT_AMBULATORY_CARE_PROVIDER_SITE_OTHER): Payer: Medicare Other | Admitting: Family Medicine

## 2016-04-17 VITALS — BP 112/76 | HR 98 | Wt 112.0 lb

## 2016-04-17 DIAGNOSIS — M25511 Pain in right shoulder: Secondary | ICD-10-CM

## 2016-04-17 DIAGNOSIS — M542 Cervicalgia: Secondary | ICD-10-CM

## 2016-04-17 DIAGNOSIS — M75102 Unspecified rotator cuff tear or rupture of left shoulder, not specified as traumatic: Secondary | ICD-10-CM

## 2016-04-17 DIAGNOSIS — M12811 Other specific arthropathies, not elsewhere classified, right shoulder: Secondary | ICD-10-CM

## 2016-04-17 DIAGNOSIS — M12812 Other specific arthropathies, not elsewhere classified, left shoulder: Secondary | ICD-10-CM | POA: Diagnosis not present

## 2016-04-17 DIAGNOSIS — M75101 Unspecified rotator cuff tear or rupture of right shoulder, not specified as traumatic: Secondary | ICD-10-CM

## 2016-04-17 NOTE — Assessment & Plan Note (Signed)
Likely cervical radiculopathy. Is following up with neurosurgery. No further workup on our end. Likely will need a CT myelogram with patient having a history of an ACDF.

## 2016-04-17 NOTE — Assessment & Plan Note (Addendum)
Patient given injection today and tolerated the procedure well.  Do believe that there is some cervical radiculopathy that is also  Plain a role. Patient given topical anti-inflammatory trial. We discussed icing regimen. Encourage her to take the gabapentin on a regular basis. I do not believe the patient would do well with formal physical therapy at this point. Patient continues to have worsening symptoms or contralateral shoulder seems to be giving trouble we'll see her again in 6 weeks and consider injection in the left shoulder.

## 2016-04-17 NOTE — Patient Instructions (Signed)
Good to meet you pennsaid pinkie amount topically 2 times daily as needed.  Heat 10 minutes, ice 10 minutes and then off for 2 hours.  Repeat 2-3 times a day.  Vitamin D 2000 IU daily  Turmeric '500mg'$  daily  Tart cherry extract at night any dose.   Avoid heavy lifting.  A good amount of this is coming from your neck and take to [name]  See me again in 4-6 weeks and if not better then we can inject the other shoulder.

## 2016-04-19 DIAGNOSIS — M5417 Radiculopathy, lumbosacral region: Secondary | ICD-10-CM | POA: Diagnosis not present

## 2016-04-19 DIAGNOSIS — M4806 Spinal stenosis, lumbar region: Secondary | ICD-10-CM | POA: Diagnosis not present

## 2016-04-24 DIAGNOSIS — M5412 Radiculopathy, cervical region: Secondary | ICD-10-CM | POA: Diagnosis not present

## 2016-04-24 DIAGNOSIS — J449 Chronic obstructive pulmonary disease, unspecified: Secondary | ICD-10-CM | POA: Diagnosis not present

## 2016-04-24 DIAGNOSIS — R0902 Hypoxemia: Secondary | ICD-10-CM | POA: Diagnosis not present

## 2016-04-24 DIAGNOSIS — M4322 Fusion of spine, cervical region: Secondary | ICD-10-CM | POA: Diagnosis not present

## 2016-04-25 ENCOUNTER — Encounter: Payer: Self-pay | Admitting: Neurology

## 2016-05-05 DIAGNOSIS — M4120 Other idiopathic scoliosis, site unspecified: Secondary | ICD-10-CM | POA: Diagnosis not present

## 2016-05-05 DIAGNOSIS — M4316 Spondylolisthesis, lumbar region: Secondary | ICD-10-CM | POA: Diagnosis not present

## 2016-05-05 DIAGNOSIS — M48061 Spinal stenosis, lumbar region without neurogenic claudication: Secondary | ICD-10-CM | POA: Diagnosis not present

## 2016-05-05 DIAGNOSIS — M5416 Radiculopathy, lumbar region: Secondary | ICD-10-CM | POA: Diagnosis not present

## 2016-05-05 DIAGNOSIS — M545 Low back pain: Secondary | ICD-10-CM | POA: Diagnosis not present

## 2016-05-08 ENCOUNTER — Other Ambulatory Visit: Payer: Self-pay | Admitting: Neurosurgery

## 2016-05-08 DIAGNOSIS — R5381 Other malaise: Secondary | ICD-10-CM

## 2016-05-15 ENCOUNTER — Ambulatory Visit (INDEPENDENT_AMBULATORY_CARE_PROVIDER_SITE_OTHER): Payer: Medicare Other | Admitting: Internal Medicine

## 2016-05-15 VITALS — BP 128/76 | HR 55 | Temp 98.1°F | Resp 20 | Wt 106.2 lb

## 2016-05-15 DIAGNOSIS — F4320 Adjustment disorder, unspecified: Secondary | ICD-10-CM

## 2016-05-15 DIAGNOSIS — F411 Generalized anxiety disorder: Secondary | ICD-10-CM | POA: Diagnosis not present

## 2016-05-15 DIAGNOSIS — R05 Cough: Secondary | ICD-10-CM

## 2016-05-15 DIAGNOSIS — Z23 Encounter for immunization: Secondary | ICD-10-CM

## 2016-05-15 DIAGNOSIS — J441 Chronic obstructive pulmonary disease with (acute) exacerbation: Secondary | ICD-10-CM | POA: Diagnosis not present

## 2016-05-15 DIAGNOSIS — F4321 Adjustment disorder with depressed mood: Secondary | ICD-10-CM

## 2016-05-15 DIAGNOSIS — R059 Cough, unspecified: Secondary | ICD-10-CM

## 2016-05-15 MED ORDER — TIOTROPIUM BROMIDE MONOHYDRATE 18 MCG IN CAPS: 18.0000 ug | ORAL_CAPSULE | Freq: Every day | RESPIRATORY_TRACT | 3 refills | Status: DC

## 2016-05-15 MED ORDER — PREDNISONE 10 MG PO TABS: ORAL_TABLET | ORAL | 0 refills | Status: DC

## 2016-05-15 MED ORDER — HYDROCODONE-HOMATROPINE 5-1.5 MG/5ML PO SYRP: 5.0000 mL | ORAL_SOLUTION | Freq: Four times a day (QID) | ORAL | 0 refills | Status: AC | PRN

## 2016-05-15 MED ORDER — CLONAZEPAM 0.5 MG PO TABS: 0.5000 mg | ORAL_TABLET | Freq: Two times a day (BID) | ORAL | 1 refills | Status: DC | PRN

## 2016-05-15 MED ORDER — LEVOFLOXACIN 500 MG PO TABS: 500.0000 mg | ORAL_TABLET | Freq: Every day | ORAL | 0 refills | Status: AC

## 2016-05-15 NOTE — Progress Notes (Signed)
Pre visit review using our clinic review tool, if applicable. No additional management support is needed unless otherwise documented below in the visit note. 

## 2016-05-15 NOTE — Patient Instructions (Addendum)
You had the flu shot today  You had the steroid shot today  Please take all new medication as prescribed - the antibiotic, cough medicine and prednisone, as well as the clonazepam (klonopin) for nerves  Please continue all other medications as before, including your inhalers  You were given the prescription for the patient assist program for spiriva  Please have the pharmacy call with any other refills you may need.  Please continue your efforts at being more active, low cholesterol diet, and weight control.  Please keep your appointments with your specialists as you may have planned

## 2016-05-15 NOTE — Progress Notes (Signed)
Subjective:    Patient ID: Michele Martin, female    DOB: 14-Apr-1946, 70 y.o.   MRN: [id number]  HPI Pt c/o "chest cold" but denies chest pain, orthopnea, PND, increased LE swelling, palpitations, dizziness or syncope  Has had significant wheezing and mild prod cough clearish sputum.  Pt denies fever, wt loss, night sweats, loss of appetite, or other constitutional symptoms  Pt denies new neurological symptoms such as new headache, or facial or extremity weakness or numbness   Pt denies polydipsia, polyuria, Also feeling low today; Daughter (also a pt of mine) died x 2 wk, making pt nervousness worse.  Denies signficant worsening depressive symptoms, but sad without suicidal ideation, or panic.  Past Medical History:  Diagnosis Date  . Acute angle-closure glaucoma 02/14/2010  . ALLERGIC RHINITIS 09/19/2009  . ANXIETY 04/15/2010  . Arthritis   . ASTHMA 09/19/2009  . BACK PAIN 02/14/2010  . Chronic bronchitis 04/14/2011  . CHRONIC OBSTRUCTIVE PULMONARY DISEASE, ACUTE EXACERBATION 12/19/2009  . Complication of anesthesia   . COPD 06/27/2009  . DEPRESSION 09/19/2009  . HOARSENESS 10/17/2009  . HYPERLIPIDEMIA 09/19/2009  . MENOPAUSAL DISORDER 08/20/2010  . OSTEOPENIA 09/10/2010  . Osteoporosis, unspecified 04/20/2014  . Rotator cuff tear 06/23/2011  . TINNITUS 09/10/2010  . TREMOR 02/14/2010  . Tremor 07/30/2011  . URI 09/10/2010  . Wheezing 06/27/2009   Past Surgical History:  Procedure Laterality Date  . ABDOMINAL HYSTERECTOMY    . cervical fusion 2013 - Dr Vertell Limber    . Normal Heart Cath  2001   Dr. Johnsie Cancel  . s/p laser tx for glaucoma     no vision loss    reports that she has quit smoking. Her smoking use included Cigarettes. She started smoking about 10 months ago. She has a 40.00 pack-year smoking history. She has never used smokeless tobacco. She reports that she does not drink alcohol or use drugs. family history includes Dementia in her mother; Rheum arthritis in her maternal  uncle. Allergies  Allergen Reactions  . Pravastatin     REACTION: itch and leg cramp  . Simvastatin     REACTION: itch and leg cramps  . Statins Itching   Current Outpatient Prescriptions on File Prior to Visit  Medication Sig Dispense Refill  . albuterol (PROVENTIL HFA) 108 (90 BASE) MCG/ACT inhaler Inhale 2 puffs into the lungs every 6 (six) hours as needed for wheezing or shortness of breath. 18 g 5  . albuterol (PROVENTIL) (2.5 MG/3ML) 0.083% nebulizer solution Take 3 mLs (2.5 mg total) by nebulization every 6 (six) hours as needed for wheezing or shortness of breath. 150 mL 5  . aspirin 81 MG EC tablet Take 81 mg by mouth daily.      [name] atorvastatin (LIPITOR) 20 MG tablet TAKE 1 TABLET BY MOUTH ONCE DAILY 90 tablet 3  . budesonide-formoterol (SYMBICORT) 160-4.5 MCG/ACT inhaler Inhale 2 puffs into the lungs 2 (two) times daily. 3 Inhaler 3  . donepezil (ARICEPT) 10 MG tablet Take 1 tablet daily 90 tablet 3  . escitalopram (LEXAPRO) 20 MG tablet TAKE 1 TABLET BY MOUTH ONCE DAILY 90 tablet 0  . fexofenadine (ALLEGRA) 180 MG tablet Take 180 mg by mouth daily.      . montelukast (SINGULAIR) 10 MG tablet TAKE 1 TABLET BY MOUTH EVERY NIGHT AT BEDTIME 30 tablet 11   No current facility-administered medications on file prior to visit.    Review of Systems  Constitutional: Negative for unusual diaphoresis or night sweats HENT: Negative  for ear swelling or discharge Eyes: Negative for worsening visual haziness  Respiratory: Negative for choking and stridor.   Gastrointestinal: Negative for distension or worsening eructation Genitourinary: Negative for retention or change in urine volume.  Musculoskeletal: Negative for other MSK pain or swelling Skin: Negative for color change and worsening wound Neurological: Negative for tremors and numbness other than noted  Psychiatric/Behavioral: Negative for decreased concentration or agitation other than above   Pt denies all other    Objective:    Physical Exam BP 128/76   Pulse (!) 55   Temp 98.1 F (36.7 C) (Oral)   Resp 20   Wt 106 lb 4 oz (48.2 kg)   SpO2 94%   BMI 20.08 kg/m  VS noted, non toxic Constitutional: Pt appears in no apparent distress HENT: Head: NCAT.  Right Ear: External ear normal.  Left Ear: External ear normal.  Eyes: . Pupils are equal, round, and reactive to light. Conjunctivae and EOM are normal Neck: Normal range of motion. Neck supple.  Cardiovascular: Normal rate and regular rhythm.   Pulmonary/Chest: Effort normal and breath sounds decreased without rales but with mild bilat wheezing.  Neurological: Pt is alert. Not confused , motor grossly intact Skin: Skin is warm. No rash, no LE edema Psychiatric: Pt behavior is normal. No agitation. dysphoric, sad    Assessment & Plan:

## 2016-05-18 NOTE — Assessment & Plan Note (Signed)
Primary issue today, afeb and doubt infection related, will hold on antibx, but for depomedrol IM, predpac asd,  to f/u any worsening symptoms or concerns

## 2016-05-18 NOTE — Assessment & Plan Note (Signed)
D/w pt, declines counseling referral, verified SI or HI, cont current klonopin prn

## 2016-05-18 NOTE — Assessment & Plan Note (Addendum)
Doubt infectious releated but cant completely r/o and several studies indicate copd exac's in general improve with antibx so for antibx, declines cxr, ok for cough med prn,  to f/u any worsening symptoms or concerns

## 2016-05-18 NOTE — Assessment & Plan Note (Signed)
decliens counseling referral,  to f/u any worsening symptoms or concerns

## 2016-05-20 ENCOUNTER — Ambulatory Visit: Payer: Medicare Other | Admitting: Family Medicine

## 2016-05-24 DIAGNOSIS — J449 Chronic obstructive pulmonary disease, unspecified: Secondary | ICD-10-CM | POA: Diagnosis not present

## 2016-05-24 DIAGNOSIS — R0902 Hypoxemia: Secondary | ICD-10-CM | POA: Diagnosis not present

## 2016-05-28 ENCOUNTER — Other Ambulatory Visit: Payer: Self-pay | Admitting: Neurosurgery

## 2016-05-28 DIAGNOSIS — M810 Age-related osteoporosis without current pathological fracture: Secondary | ICD-10-CM

## 2016-06-03 ENCOUNTER — Telehealth: Payer: Self-pay

## 2016-06-03 NOTE — Telephone Encounter (Signed)
Ok for restart

## 2016-06-03 NOTE — Telephone Encounter (Signed)
Routing to dr [employer] has been verified for prolia injections in the past---I cant tell why patient stopped prolia in the past---is it ok if patient starts prolia injections again---please advise, thanks

## 2016-06-04 NOTE — Telephone Encounter (Signed)
Insurance will be verified and patient contacted to see if they are still interested in prolia---see tamara with any questions

## 2016-06-05 ENCOUNTER — Ambulatory Visit
Admission: RE | Admit: 2016-06-05 | Discharge: 2016-06-05 | Disposition: A | Discharge status: Home / Self Care | Payer: Medicare Other | Source: Ambulatory Visit | Attending: Neurosurgery | Admitting: Neurosurgery

## 2016-06-05 DIAGNOSIS — Z780 Asymptomatic menopausal state: Secondary | ICD-10-CM | POA: Diagnosis not present

## 2016-06-05 DIAGNOSIS — M810 Age-related osteoporosis without current pathological fracture: Secondary | ICD-10-CM

## 2016-06-17 ENCOUNTER — Ambulatory Visit (INDEPENDENT_AMBULATORY_CARE_PROVIDER_SITE_OTHER): Payer: Medicare Other | Admitting: General Practice

## 2016-06-17 DIAGNOSIS — M8000XA Age-related osteoporosis with current pathological fracture, unspecified site, initial encounter for fracture: Secondary | ICD-10-CM | POA: Diagnosis not present

## 2016-06-17 MED ORDER — DENOSUMAB 60 MG/ML ~~LOC~~ SOLN
60.0000 mg | Freq: Once | SUBCUTANEOUS | Status: AC
  Administered 2016-06-17: 60 mg via SUBCUTANEOUS

## 2016-06-24 DIAGNOSIS — J449 Chronic obstructive pulmonary disease, unspecified: Secondary | ICD-10-CM | POA: Diagnosis not present

## 2016-06-24 DIAGNOSIS — R0902 Hypoxemia: Secondary | ICD-10-CM | POA: Diagnosis not present

## 2016-06-25 ENCOUNTER — Other Ambulatory Visit: Payer: Self-pay | Admitting: Acute Care

## 2016-06-25 ENCOUNTER — Other Ambulatory Visit: Payer: Medicare Other

## 2016-06-25 ENCOUNTER — Ambulatory Visit (INDEPENDENT_AMBULATORY_CARE_PROVIDER_SITE_OTHER): Payer: Medicare Other | Admitting: Internal Medicine

## 2016-06-25 ENCOUNTER — Encounter: Payer: Self-pay | Admitting: Internal Medicine

## 2016-06-25 ENCOUNTER — Telehealth: Payer: Self-pay | Admitting: Acute Care

## 2016-06-25 VITALS — BP 140/80 | HR 92 | Temp 98.0°F | Resp 20 | Wt 105.0 lb

## 2016-06-25 DIAGNOSIS — J441 Chronic obstructive pulmonary disease with (acute) exacerbation: Secondary | ICD-10-CM | POA: Diagnosis not present

## 2016-06-25 DIAGNOSIS — E785 Hyperlipidemia, unspecified: Secondary | ICD-10-CM

## 2016-06-25 DIAGNOSIS — G894 Chronic pain syndrome: Secondary | ICD-10-CM | POA: Diagnosis not present

## 2016-06-25 DIAGNOSIS — F411 Generalized anxiety disorder: Secondary | ICD-10-CM

## 2016-06-25 DIAGNOSIS — Z0001 Encounter for general adult medical examination with abnormal findings: Secondary | ICD-10-CM | POA: Diagnosis not present

## 2016-06-25 DIAGNOSIS — E559 Vitamin D deficiency, unspecified: Secondary | ICD-10-CM | POA: Diagnosis not present

## 2016-06-25 DIAGNOSIS — F17210 Nicotine dependence, cigarettes, uncomplicated: Secondary | ICD-10-CM

## 2016-06-25 MED ORDER — METHYLPREDNISOLONE ACETATE 80 MG/ML IJ SUSP
80.0000 mg | Freq: Once | INTRAMUSCULAR | Status: AC
  Administered 2016-06-25: 80 mg via INTRAMUSCULAR

## 2016-06-25 MED ORDER — HYDROCODONE-HOMATROPINE 5-1.5 MG/5ML PO SYRP: 5.0000 mL | ORAL_SOLUTION | Freq: Four times a day (QID) | ORAL | 0 refills | Status: AC | PRN

## 2016-06-25 MED ORDER — AZITHROMYCIN 250 MG PO TABS: ORAL_TABLET | ORAL | 1 refills | Status: DC

## 2016-06-25 MED ORDER — HYDROXYZINE HCL 10 MG PO TABS: 10.0000 mg | ORAL_TABLET | Freq: Three times a day (TID) | ORAL | 3 refills | Status: DC | PRN

## 2016-06-25 MED ORDER — PREDNISONE 10 MG PO TABS: ORAL_TABLET | ORAL | 0 refills | Status: DC

## 2016-06-25 NOTE — Telephone Encounter (Signed)
I called to ensure that Mrs. Michele Martin had received the results of her March 2017 screening scan. She had been called twice, 10/27/2015 and again on 11/03/2015, but never returned calls to the clinic. She had been given results by her primary care provider which is why she did not return call. I just wanted to ensure she had received the information which indicated lung RADS 2 nodules that are benign in appearance and behavior, and let her know that we will schedule her for a March 2018 low-dose screening scan. I explained that we will call about one month before it is due. She verbalized understanding of the above and had no further questions.

## 2016-06-25 NOTE — Progress Notes (Signed)
Pre visit review using our clinic review tool, if applicable. No additional management support is needed unless otherwise documented below in the visit note. 

## 2016-06-25 NOTE — Patient Instructions (Signed)
You had the steroid shot today  Please take all new medication as prescribed  - the prednisone, cough medicine if needed, and antibiotic, as well as the generic Atarax for nerves  Please continue all other medications as before, and refills have been done if requested.  Please have the pharmacy call with any other refills you may need.  Please continue your efforts at being more active, low cholesterol diet, and weight control.  You are otherwise up to date with prevention measures today.  Please keep your appointments with your specialists as you may have planned  Please go to the XRAY Department in the Basement (go straight as you get off the elevator) for the x-ray testing  Please go to the LAB in the Basement (turn left off the elevator) for the tests to be done today  You will be contacted by phone if any changes need to be made immediately.  Otherwise, you will receive a letter about your results with an explanation, but please check with MyChart first.  Please remember to sign up for MyChart if you have not done so, as this will be important to you in the future with finding out test results, communicating by private email, and scheduling acute appointments online when needed.  If you have Medicare related insurance (such as traditoinal Medicare, [employer] or [employer], or similar), Please make an appointment at the Scheduling desk with Maudie Mercury, the [employer], for your Wellness Visit in this office, which is a benefit with your insurance.  Please return in 3 months, or sooner if needed

## 2016-06-25 NOTE — Progress Notes (Signed)
Subjective:    Patient ID: Michele Martin, female    DOB: 1946/02/14, 70 y.o.   MRN: [id number]  HPI  Here for wellness and f/u;  Overall doing ok;  Pt denies Chest pain, orthopnea, PND, worsening LE edema, palpitations, dizziness or syncope.  Pt denies neurological change such as new headache, facial or extremity weakness.  Pt denies polydipsia, polyuria, or low sugar symptoms. Pt states overall good compliance with treatment and medications, good tolerability, and has been trying to follow appropriate diet.  Pt denies worsening depressive symptoms, suicidal ideation or panic. No fever, night sweats, wt loss, loss of appetite, or other constitutional symptoms.  Pt states good ability with ADL's, has low fall risk, home safety reviewed and adequate, no other significant changes in hearing or vision, and only occasionally active with exercise.  Pt also c/o mild 3-4 days increased sob or doe, wheezing, with scant prod cough.  Pt states cannot have spine surgury until bone density is improved; will cont the prolia,. Last dxa just nov 9, worst t-score -3.2.  Pt continues to have recurring LBP without change in severity, now mod to severe near constant, followed by Dr Encarnacion Chu, but no bowel or bladder change, fever, wt loss,  worsening LE pain/numbness/weakness, gait change or falls, except for possibly pain to left calf as well.  Trying to follow lower chol diet. Past Medical History:  Diagnosis Date  . Acute angle-closure glaucoma 02/14/2010  . ALLERGIC RHINITIS 09/19/2009  . ANXIETY 04/15/2010  . Arthritis   . ASTHMA 09/19/2009  . BACK PAIN 02/14/2010  . Chronic bronchitis 04/14/2011  . CHRONIC OBSTRUCTIVE PULMONARY DISEASE, ACUTE EXACERBATION 12/19/2009  . Complication of anesthesia   . COPD 06/27/2009  . DEPRESSION 09/19/2009  . HOARSENESS 10/17/2009  . HYPERLIPIDEMIA 09/19/2009  . MENOPAUSAL DISORDER 08/20/2010  . OSTEOPENIA 09/10/2010  . Osteoporosis, unspecified 04/20/2014  . Rotator cuff tear  06/23/2011  . TINNITUS 09/10/2010  . TREMOR 02/14/2010  . Tremor 07/30/2011  . URI 09/10/2010  . Wheezing 06/27/2009   Past Surgical History:  Procedure Laterality Date  . ABDOMINAL HYSTERECTOMY    . cervical fusion 2013 - Dr Vertell Limber    . Normal Heart Cath  2001   Dr. Johnsie Cancel  . s/p laser tx for glaucoma     no vision loss    reports that she has quit smoking. Her smoking use included Cigarettes. She started smoking about a year ago. She has a 40.00 pack-year smoking history. She has never used smokeless tobacco. She reports that she does not drink alcohol or use drugs. family history includes Dementia in her mother; Rheum arthritis in her maternal uncle. Allergies  Allergen Reactions  . Pravastatin     REACTION: itch and leg cramp  . Simvastatin     REACTION: itch and leg cramps  . Statins Itching   Current Outpatient Prescriptions on File Prior to Visit  Medication Sig Dispense Refill  . albuterol (PROVENTIL HFA) 108 (90 BASE) MCG/ACT inhaler Inhale 2 puffs into the lungs every 6 (six) hours as needed for wheezing or shortness of breath. 18 g 5  . albuterol (PROVENTIL) (2.5 MG/3ML) 0.083% nebulizer solution Take 3 mLs (2.5 mg total) by nebulization every 6 (six) hours as needed for wheezing or shortness of breath. 150 mL 5  . aspirin 81 MG EC tablet Take 81 mg by mouth daily.      [name] atorvastatin (LIPITOR) 20 MG tablet TAKE 1 TABLET BY MOUTH ONCE DAILY 90 tablet 3  .  budesonide-formoterol (SYMBICORT) 160-4.5 MCG/ACT inhaler Inhale 2 puffs into the lungs 2 (two) times daily. 3 Inhaler 3  . clonazePAM (KLONOPIN) 0.5 MG tablet Take 1 tablet (0.5 mg total) by mouth 2 (two) times daily as needed for anxiety. 60 tablet 1  . donepezil (ARICEPT) 10 MG tablet Take 1 tablet daily 90 tablet 3  . escitalopram (LEXAPRO) 20 MG tablet TAKE 1 TABLET BY MOUTH ONCE DAILY 90 tablet 0  . fexofenadine (ALLEGRA) 180 MG tablet Take 180 mg by mouth daily.      . montelukast (SINGULAIR) 10 MG tablet TAKE 1  TABLET BY MOUTH EVERY NIGHT AT BEDTIME 30 tablet 11  . tiotropium (SPIRIVA HANDIHALER) 18 MCG inhalation capsule Place 1 capsule (18 mcg total) into inhaler and inhale daily. 90 capsule 3   No current facility-administered medications on file prior to visit.    Statin intolerant  Review of Systems Constitutional: Negative for increased diaphoresis, or other activity, appetite or siginficant weight change other than noted HENT: Negative for worsening hearing loss, ear pain, facial swelling, mouth sores and neck stiffness.   Eyes: Negative for other worsening pain, redness or visual disturbance.  Respiratory: Negative for choking or stridor Cardiovascular: Negative for other chest pain and palpitations.  Gastrointestinal: Negative for worsening diarrhea, blood in stool, or abdominal distention Genitourinary: Negative for hematuria, flank pain or change in urine volume.  Musculoskeletal: Negative for myalgias or other joint complaints.  Skin: Negative for other color change and wound or drainage.  Neurological: Negative for syncope and numbness. other than noted Hematological: Negative for adenopathy. or other swelling Psychiatric/Behavioral: Negative for hallucinations, SI, self-injury, decreased concentration or other worsening agitation.  All other system neg per pt    Objective:   Physical Exam BP 140/80   Pulse 92   Temp 98 F (36.7 C) (Oral)   Resp 20   Wt 105 lb (47.6 kg)   SpO2 92%   BMI 19.84 kg/m  VS noted,  Constitutional: Pt is oriented to person, place, and time. Appears well-developed and well-nourished, in no significant distress Head: Normocephalic and atraumatic  Eyes: Conjunctivae and EOM are normal. Pupils are equal, round, and reactive to light Right Ear: External ear normal.  Left Ear: External ear normal Nose: Nose normal.  Mouth/Throat: Oropharynx is clear and moist  Neck: Normal range of motion. Neck supple. No JVD present. No tracheal deviation present  or significant neck LA or mass Cardiovascular: Normal rate, regular rhythm, normal heart sounds and intact distal pulses.   Pulmonary/Chest: Effort normal and breath sounds decreased without rales but with mild bilat wheezing  Abdominal: Soft. Bowel sounds are normal. NT. No HSM  Musculoskeletal: Normal range of motion. Exhibits no edema Lymphadenopathy: Has no cervical adenopathy.  Neurological: Pt is alert and oriented to person, place, and time. Pt has normal reflexes. No cranial nerve deficit. Motor grossly intact Skin: Skin is warm and dry. No rash noted or new ulcers Psychiatric:  Has nervous mood and affect. Behavior is normal. Spine nontender midline   No other new exam findigns    Assessment & Plan:

## 2016-06-29 NOTE — Assessment & Plan Note (Signed)

## 2016-06-29 NOTE — Assessment & Plan Note (Signed)
Mild to mod, for atarax prn,  to f/u any worsening symptoms or concerns

## 2016-06-29 NOTE — Assessment & Plan Note (Signed)
Cont lower chol diet, for f/u lipids

## 2016-06-29 NOTE — Assessment & Plan Note (Addendum)
Mild to mod, for antibx course, depomedrol 80 IM, predpac asd, cough med prn,  to f/u any worsening symptoms or concerns  In addition to the time spent performing CPE, I spent an additional 25 minutes face to face,in which greater than 50% of this time was spent in counseling and coordination of care for patient's acute illness as documented.

## 2016-06-29 NOTE — Assessment & Plan Note (Signed)
With persistent LBP, cont same tx

## 2016-07-14 DIAGNOSIS — M545 Low back pain: Secondary | ICD-10-CM | POA: Diagnosis not present

## 2016-07-14 DIAGNOSIS — M5416 Radiculopathy, lumbar region: Secondary | ICD-10-CM | POA: Diagnosis not present

## 2016-07-14 DIAGNOSIS — M48061 Spinal stenosis, lumbar region without neurogenic claudication: Secondary | ICD-10-CM | POA: Diagnosis not present

## 2016-07-14 DIAGNOSIS — M810 Age-related osteoporosis without current pathological fracture: Secondary | ICD-10-CM | POA: Diagnosis not present

## 2016-07-14 DIAGNOSIS — M4120 Other idiopathic scoliosis, site unspecified: Secondary | ICD-10-CM | POA: Diagnosis not present

## 2016-07-16 ENCOUNTER — Ambulatory Visit (INDEPENDENT_AMBULATORY_CARE_PROVIDER_SITE_OTHER): Payer: Medicare Other | Admitting: Internal Medicine

## 2016-07-16 ENCOUNTER — Encounter: Payer: Self-pay | Admitting: Internal Medicine

## 2016-07-16 ENCOUNTER — Ambulatory Visit (INDEPENDENT_AMBULATORY_CARE_PROVIDER_SITE_OTHER)
Admission: RE | Admit: 2016-07-16 | Discharge: 2016-07-16 | Disposition: A | Discharge status: Home / Self Care | Payer: Medicare Other | Source: Ambulatory Visit | Attending: Internal Medicine | Admitting: Internal Medicine

## 2016-07-16 VITALS — BP 128/72 | HR 102 | Temp 98.6°F | Resp 20 | Wt 104.0 lb

## 2016-07-16 DIAGNOSIS — G894 Chronic pain syndrome: Secondary | ICD-10-CM | POA: Diagnosis not present

## 2016-07-16 DIAGNOSIS — J441 Chronic obstructive pulmonary disease with (acute) exacerbation: Secondary | ICD-10-CM | POA: Diagnosis not present

## 2016-07-16 DIAGNOSIS — R0600 Dyspnea, unspecified: Secondary | ICD-10-CM | POA: Diagnosis not present

## 2016-07-16 DIAGNOSIS — R05 Cough: Secondary | ICD-10-CM

## 2016-07-16 DIAGNOSIS — M810 Age-related osteoporosis without current pathological fracture: Secondary | ICD-10-CM

## 2016-07-16 DIAGNOSIS — R059 Cough, unspecified: Secondary | ICD-10-CM

## 2016-07-16 MED ORDER — PREDNISONE 10 MG PO TABS: ORAL_TABLET | ORAL | 0 refills | Status: DC

## 2016-07-16 MED ORDER — METHYLPREDNISOLONE ACETATE 80 MG/ML IJ SUSP
80.0000 mg | Freq: Once | INTRAMUSCULAR | Status: AC
  Administered 2016-07-16: 80 mg via INTRAMUSCULAR

## 2016-07-16 MED ORDER — HYDROCODONE-HOMATROPINE 5-1.5 MG/5ML PO SYRP: 5.0000 mL | ORAL_SOLUTION | Freq: Four times a day (QID) | ORAL | 0 refills | Status: DC | PRN

## 2016-07-16 MED ORDER — LEVOFLOXACIN 500 MG PO TABS: 500.0000 mg | ORAL_TABLET | Freq: Every day | ORAL | 0 refills | Status: DC

## 2016-07-16 NOTE — Progress Notes (Signed)
Pre visit review using our clinic review tool, if applicable. No additional management support is needed unless otherwise documented below in the visit note. 

## 2016-07-16 NOTE — Patient Instructions (Addendum)
You had the steroid shot today  Please take all new medication as prescribed - the prednisone, and antibiotic, and cough medicine if needed  Please continue all other medications as before, including the prolia for your bone loss and lower back  Please have the pharmacy call with any other refills you may need.  Please keep your appointments with your specialists as you may have planned - the back surgeon for possible steroid shot repeat  Please go to the XRAY Department in the Basement (go straight as you get off the elevator) for the x-ray testing  You will be contacted by phone if any changes need to be made immediately.  Otherwise, you will receive a letter about your results with an explanation, but please check with MyChart first.  Please remember to sign up for MyChart if you have not done so, as this will be important to you in the future with finding out test results, communicating by private email, and scheduling acute appointments online when needed.  If you have Medicare related insurance (such as traditoinal Medicare, [employer] or [employer], or similar), Please make an appointment at the Scheduling desk with Maudie Mercury, the [employer], for your Wellness Visit in this office, which is a benefit with your insurance.  Please return in 3 months, or sooner if needed

## 2016-07-16 NOTE — Progress Notes (Signed)
Subjective:    Patient ID: Michele Martin, female    DOB: 1945-12-05, 70 y.o.   MRN: [id number]  HPI  Here with acute onset mild to mod 2-3 days ST, HA, general weakness and malaise, with prod cough greenish sputum, but Pt denies chest pain, increased sob or doe, wheezing, orthopnea, PND, increased LE swelling, palpitations, dizziness or syncope, except for onset mild wheezing and sob since 1-2 days gradually worsening.  Wondering if still needs to continue the prolia as her back pain is not getting better overall.  Pt denies new neurological symptoms such as new headache, or facial or extremity weakness or numbness   Pt denies polydipsia, polyuria Past Medical History:  Diagnosis Date  . Acute angle-closure glaucoma 02/14/2010  . ALLERGIC RHINITIS 09/19/2009  . ANXIETY 04/15/2010  . Arthritis   . ASTHMA 09/19/2009  . BACK PAIN 02/14/2010  . Chronic bronchitis 04/14/2011  . CHRONIC OBSTRUCTIVE PULMONARY DISEASE, ACUTE EXACERBATION 12/19/2009  . Complication of anesthesia   . COPD 06/27/2009  . DEPRESSION 09/19/2009  . HOARSENESS 10/17/2009  . HYPERLIPIDEMIA 09/19/2009  . MENOPAUSAL DISORDER 08/20/2010  . OSTEOPENIA 09/10/2010  . Osteoporosis, unspecified 04/20/2014  . Rotator cuff tear 06/23/2011  . TINNITUS 09/10/2010  . TREMOR 02/14/2010  . Tremor 07/30/2011  . URI 09/10/2010  . Wheezing 06/27/2009   Past Surgical History:  Procedure Laterality Date  . ABDOMINAL HYSTERECTOMY    . cervical fusion 2013 - Dr Vertell Limber    . Normal Heart Cath  2001   Dr. Johnsie Cancel  . s/p laser tx for glaucoma     no vision loss    reports that she has quit smoking. Her smoking use included Cigarettes. She started smoking about 12 months ago. She has a 40.00 pack-year smoking history. She has never used smokeless tobacco. She reports that she does not drink alcohol or use drugs. family history includes Dementia in her mother; Rheum arthritis in her maternal uncle. Allergies  Allergen Reactions  . Pravastatin    REACTION: itch and leg cramp  . Simvastatin     REACTION: itch and leg cramps  . Statins Itching   Current Outpatient Prescriptions on File Prior to Visit  Medication Sig Dispense Refill  . albuterol (PROVENTIL HFA) 108 (90 BASE) MCG/ACT inhaler Inhale 2 puffs into the lungs every 6 (six) hours as needed for wheezing or shortness of breath. 18 g 5  . albuterol (PROVENTIL) (2.5 MG/3ML) 0.083% nebulizer solution Take 3 mLs (2.5 mg total) by nebulization every 6 (six) hours as needed for wheezing or shortness of breath. 150 mL 5  . aspirin 81 MG EC tablet Take 81 mg by mouth daily.      [name] atorvastatin (LIPITOR) 20 MG tablet TAKE 1 TABLET BY MOUTH ONCE DAILY 90 tablet 3  . budesonide-formoterol (SYMBICORT) 160-4.5 MCG/ACT inhaler Inhale 2 puffs into the lungs 2 (two) times daily. 3 Inhaler 3  . clonazePAM (KLONOPIN) 0.5 MG tablet Take 1 tablet (0.5 mg total) by mouth 2 (two) times daily as needed for anxiety. 60 tablet 1  . donepezil (ARICEPT) 10 MG tablet Take 1 tablet daily 90 tablet 3  . escitalopram (LEXAPRO) 20 MG tablet TAKE 1 TABLET BY MOUTH ONCE DAILY 90 tablet 0  . fexofenadine (ALLEGRA) 180 MG tablet Take 180 mg by mouth daily.      . montelukast (SINGULAIR) 10 MG tablet TAKE 1 TABLET BY MOUTH EVERY NIGHT AT BEDTIME 30 tablet 11  . tiotropium (SPIRIVA HANDIHALER) 18 MCG inhalation capsule  Place 1 capsule (18 mcg total) into inhaler and inhale daily. 90 capsule 3   No current facility-administered medications on file prior to visit.    Review of Systems  Constitutional: Negative for unusual diaphoresis or night sweats HENT: Negative for ear swelling or discharge Eyes: Negative for worsening visual haziness  Respiratory: Negative for choking and stridor.   Gastrointestinal: Negative for distension or worsening eructation Genitourinary: Negative for retention or change in urine volume.  Musculoskeletal: Negative for other MSK pain or swelling Skin: Negative for color change and  worsening wound Neurological: Negative for tremors and numbness other than noted  Psychiatric/Behavioral: Negative for decreased concentration or agitation other than above   All other system neg per pt    Objective:   Physical Exam BP 128/72   Pulse (!) 102   Temp 98.6 F (37 C) (Oral)   Resp 20   Wt 104 lb (47.2 kg)   SpO2 92%   BMI 19.65 kg/m  VS noted,  Constitutional: Pt appears in no apparent distress HENT: Head: NCAT.  Right Ear: External ear normal.  Left Ear: External ear normal.  Eyes: . Pupils are equal, round, and reactive to light. Conjunctivae and EOM are normal Neck: Normal range of motion. Neck supple.  Cardiovascular: Normal rate and regular rhythm.   Pulmonary/Chest: Effort normal and breath sounds decreased without rales but with moderate bilat wheezing.  Neurological: Pt is alert. Not confused , motor grossly intact Skin: Skin is warm. No rash, no LE edema Psychiatric: Pt behavior is normal. No agitation. midl nervous  No other new exam findings    Assessment & Plan:

## 2016-07-20 NOTE — Assessment & Plan Note (Signed)
Encouraged pt to continue prolia as she has been doing, though the effect would take several yrs to manifest

## 2016-07-20 NOTE — Assessment & Plan Note (Signed)
Chronic persistent primarily lumbar related, unfortunately not a surgical candidate due to comorbids, cont pain control,  to f/u any worsening symptoms or concerns

## 2016-07-20 NOTE — Assessment & Plan Note (Signed)
Mild to mod, for depomedrol IM 80, predpac asd, to f/u any worsening symptoms or concerns 

## 2016-07-20 NOTE — Assessment & Plan Note (Signed)
Mild to mod, c/w bronchitis vs pna, for cxr, for antibx course, cough med prn,  to f/u any worsening symptoms or concerns 

## 2016-07-24 ENCOUNTER — Emergency Department (HOSPITAL_COMMUNITY): Payer: Medicare Other

## 2016-07-24 ENCOUNTER — Encounter (HOSPITAL_COMMUNITY): Payer: Self-pay | Admitting: *Deleted

## 2016-07-24 ENCOUNTER — Emergency Department (HOSPITAL_COMMUNITY)
Admission: EM | Admit: 2016-07-24 | Discharge: 2016-07-24 | Disposition: A | Discharge status: Home / Self Care | Payer: Medicare Other | Source: Home / Self Care | Attending: Emergency Medicine | Admitting: Emergency Medicine

## 2016-07-24 ENCOUNTER — Telehealth: Payer: Self-pay | Admitting: Internal Medicine

## 2016-07-24 DIAGNOSIS — J9601 Acute respiratory failure with hypoxia: Secondary | ICD-10-CM | POA: Diagnosis not present

## 2016-07-24 DIAGNOSIS — J441 Chronic obstructive pulmonary disease with (acute) exacerbation: Secondary | ICD-10-CM | POA: Insufficient documentation

## 2016-07-24 DIAGNOSIS — E785 Hyperlipidemia, unspecified: Secondary | ICD-10-CM | POA: Diagnosis not present

## 2016-07-24 DIAGNOSIS — I5030 Unspecified diastolic (congestive) heart failure: Secondary | ICD-10-CM | POA: Diagnosis not present

## 2016-07-24 DIAGNOSIS — Z981 Arthrodesis status: Secondary | ICD-10-CM | POA: Diagnosis not present

## 2016-07-24 DIAGNOSIS — Z888 Allergy status to other drugs, medicaments and biological substances status: Secondary | ICD-10-CM | POA: Diagnosis not present

## 2016-07-24 DIAGNOSIS — M810 Age-related osteoporosis without current pathological fracture: Secondary | ICD-10-CM | POA: Diagnosis not present

## 2016-07-24 DIAGNOSIS — J449 Chronic obstructive pulmonary disease, unspecified: Secondary | ICD-10-CM | POA: Diagnosis not present

## 2016-07-24 DIAGNOSIS — Z7982 Long term (current) use of aspirin: Secondary | ICD-10-CM

## 2016-07-24 DIAGNOSIS — R079 Chest pain, unspecified: Secondary | ICD-10-CM | POA: Diagnosis not present

## 2016-07-24 DIAGNOSIS — M1990 Unspecified osteoarthritis, unspecified site: Secondary | ICD-10-CM | POA: Diagnosis not present

## 2016-07-24 DIAGNOSIS — G4730 Sleep apnea, unspecified: Secondary | ICD-10-CM | POA: Diagnosis not present

## 2016-07-24 DIAGNOSIS — R0602 Shortness of breath: Secondary | ICD-10-CM | POA: Insufficient documentation

## 2016-07-24 DIAGNOSIS — E440 Moderate protein-calorie malnutrition: Secondary | ICD-10-CM | POA: Diagnosis not present

## 2016-07-24 DIAGNOSIS — R0902 Hypoxemia: Secondary | ICD-10-CM | POA: Diagnosis not present

## 2016-07-24 DIAGNOSIS — B370 Candidal stomatitis: Secondary | ICD-10-CM

## 2016-07-24 DIAGNOSIS — Z79899 Other long term (current) drug therapy: Secondary | ICD-10-CM | POA: Insufficient documentation

## 2016-07-24 DIAGNOSIS — Z87891 Personal history of nicotine dependence: Secondary | ICD-10-CM | POA: Insufficient documentation

## 2016-07-24 DIAGNOSIS — J440 Chronic obstructive pulmonary disease with acute lower respiratory infection: Secondary | ICD-10-CM | POA: Diagnosis not present

## 2016-07-24 DIAGNOSIS — R0789 Other chest pain: Secondary | ICD-10-CM | POA: Diagnosis not present

## 2016-07-24 DIAGNOSIS — Z7952 Long term (current) use of systemic steroids: Secondary | ICD-10-CM | POA: Diagnosis not present

## 2016-07-24 LAB — BASIC METABOLIC PANEL
ANION GAP: 8 (ref 5–15)
BUN: 14 mg/dL (ref 6–20)
CALCIUM: 9.7 mg/dL (ref 8.9–10.3)
CO2: 31 mmol/L (ref 22–32)
Chloride: 101 mmol/L (ref 101–111)
Creatinine, Ser: 0.62 mg/dL (ref 0.44–1.00)
Glucose, Bld: 97 mg/dL (ref 65–99)
POTASSIUM: 4.3 mmol/L (ref 3.5–5.1)
SODIUM: 140 mmol/L (ref 135–145)

## 2016-07-24 LAB — I-STAT TROPONIN, ED
TROPONIN I, POC: 0.01 ng/mL (ref 0.00–0.08)
Troponin i, poc: 0.01 ng/mL (ref 0.00–0.08)

## 2016-07-24 LAB — CBC
HEMATOCRIT: 48.1 % — AB (ref 36.0–46.0)
HEMOGLOBIN: 15.7 g/dL — AB (ref 12.0–15.0)
MCH: 32.6 pg (ref 26.0–34.0)
MCHC: 32.6 g/dL (ref 30.0–36.0)
MCV: 99.8 fL (ref 78.0–100.0)
Platelets: 219 10*3/uL (ref 150–400)
RBC: 4.82 MIL/uL (ref 3.87–5.11)
RDW: 13.6 % (ref 11.5–15.5)
WBC: 8.1 10*3/uL (ref 4.0–10.5)

## 2016-07-24 LAB — BRAIN NATRIURETIC PEPTIDE: B NATRIURETIC PEPTIDE 5: 40.6 pg/mL (ref 0.0–100.0)

## 2016-07-24 MED ORDER — NYSTATIN 100000 UNIT/ML MT SUSP: 5.0000 mL | Freq: Four times a day (QID) | OROMUCOSAL | 0 refills | Status: DC

## 2016-07-24 MED ORDER — IPRATROPIUM-ALBUTEROL 0.5-2.5 (3) MG/3ML IN SOLN
3.0000 mL | Freq: Once | RESPIRATORY_TRACT | Status: AC
  Administered 2016-07-24: 3 mL via RESPIRATORY_TRACT
  Filled 2016-07-24: qty 3

## 2016-07-24 MED ORDER — PREDNISONE 20 MG PO TABS: 40.0000 mg | ORAL_TABLET | Freq: Every day | ORAL | 0 refills | Status: DC

## 2016-07-24 MED ORDER — PREDNISONE 20 MG PO TABS
40.0000 mg | ORAL_TABLET | Freq: Once | ORAL | Status: AC
  Administered 2016-07-24: 40 mg via ORAL
  Filled 2016-07-24: qty 2

## 2016-07-24 NOTE — ED Notes (Signed)
Pt ask if palpitation can be caused by nerves. Pt states daughter died in 04/14/23.

## 2016-07-24 NOTE — ED Triage Notes (Signed)
Pt reports feeling her heart race some last week and now has become constant racing unless she is laying down. Pt denies pain. Reports SOB.

## 2016-07-24 NOTE — Telephone Encounter (Signed)
Patient Name: Michele Martin  DOB: 1946-01-03    Initial Comment Caller states she has rapid heart beat / palpitations.    Nurse Assessment  Nurse: Raphael Gibney, RN, Vanita Ingles Date/Time (Eastern Time): 07/24/2016 9:21:09 AM  Confirm and document reason for call. If symptomatic, describe symptoms. ---Caller states she is having rapid heart beat and some palpitations. Pounding in chest got worse yesterday. No chest pain. She is SOB.  Does the patient have any new or worsening symptoms? ---Yes  Will a triage be completed? ---Yes  Related visit to physician within the last 2 weeks? ---Yes  Does the PT have any chronic conditions? (i.e. diabetes, asthma, etc.) ---No  Is this a behavioral health or substance abuse call? ---No     Guidelines    Guideline Title Affirmed Question Affirmed Notes  Heart Rate and Heartbeat Questions Difficulty breathing    Final Disposition User   Go to ED Now Raphael Gibney, RN, Vanita Ingles    Comments  Pt states she is not going to the ER but wants to see Dr. Jenny Reichmann. Called back line and spoke to [place] and gave report that has rapid heart rate with palpitations with some SOB. Triage outcome of go to ER now and pt does not want to go but wants to see Dr. Jenny Reichmann   Referrals  GO TO FACILITY REFUSED   Disagree/Comply: Disagree  Disagree/Comply Reason: Disagree with instructions

## 2016-07-24 NOTE — ED Provider Notes (Signed)
[facility] DEPT Provider Note   CSN: [id number] Arrival date & time: 07/24/16 1404     History    Chief Complaint  Patient presents with  . Palpitations     HPI Michele Martin is a 70 y.o. female.  70yo F w/ PMH including COPD, depression, HLD who p/w Shortness of breath and palpitations. The patient reports a few weeks of shortness of breath that occurs at rest but is also worse with exertion. Since last week, she has had constant heart racing sensation. No associated chest pain. The only time that her symptoms seemed to resolve his when she lays down and rests. She denies any significant change in cough, fevers, vomiting, or recent illness. No recent changes to her medicines. No recent travel, history of cancer, history of blood clots, or leg swelling. Patient states that her daughter died in 04/12/23 and she is not sure whether this is related to this emotional stressor.   Past Medical History:  Diagnosis Date  . Acute angle-closure glaucoma 02/14/2010  . ALLERGIC RHINITIS 09/19/2009  . ANXIETY 04/15/2010  . Arthritis   . ASTHMA 09/19/2009  . BACK PAIN 02/14/2010  . Chronic bronchitis 04/14/2011  . CHRONIC OBSTRUCTIVE PULMONARY DISEASE, ACUTE EXACERBATION 12/19/2009  . Complication of anesthesia   . COPD 06/27/2009  . DEPRESSION 09/19/2009  . HOARSENESS 10/17/2009  . HYPERLIPIDEMIA 09/19/2009  . MENOPAUSAL DISORDER 08/20/2010  . OSTEOPENIA 09/10/2010  . Osteoporosis, unspecified 04/20/2014  . Rotator cuff tear 06/23/2011  . TINNITUS 09/10/2010  . TREMOR 02/14/2010  . Tremor 07/30/2011  . URI 09/10/2010  . Wheezing 06/27/2009     Patient Active Problem List   Diagnosis Date Noted  . Grief reaction 05/15/2016  . Rotator cuff tear arthropathy of both shoulders 04/17/2016  . Neck pain on right side 03/13/2016  . Angioedema 10/23/2015  . Cough 09/07/2015  . Benign neoplasm of meninges ([place]) 08/29/2015  . Dizziness and giddiness 08/29/2015  . Mild cognitive impairment  08/10/2015  . Benign paroxysmal positional vertigo 08/10/2015  . Hyperlipidemia 08/10/2015  . Memory dysfunction 07/03/2015  . Solitary pulmonary nodule 05/09/2015  . Right hip pain 01/23/2015  . Chest pain 10/25/2014  . Right sided sciatica 10/11/2014  . Chronic pain syndrome 10/11/2014  . Osteoporosis 04/20/2014  . COPD exacerbation ([place]) 08/25/2013  . General weakness 12/22/2012  . Personal history of colonic polyps 10/25/2012  . Anterior neck pain 10/25/2012  . COPD GOLD III/ still smoking  08/24/2012  . Shingles 07/14/2012  . Orthostatic hypotension 05/07/2012  . Involuntary movements 11/19/2011  . Tremor 07/30/2011  . Rotator cuff tear 06/23/2011  . Lower back pain 06/23/2011  . Balance problem 06/23/2011  . Smoker 05/22/2011  . Preop exam for internal medicine 05/13/2011  . Chronic bronchitis ([place]) 04/14/2011  . Peripheral edema 01/13/2011  . Right shoulder pain 01/13/2011  . Encounter for well adult exam with abnormal findings 10/31/2010  . TINNITUS 09/10/2010  . MENOPAUSAL DISORDER 08/20/2010  . Anxiety state 04/15/2010  . Acute angle-closure glaucoma 02/14/2010  . HOARSENESS 10/17/2009  . Depression 09/19/2009  . Allergic rhinitis 09/19/2009    Past Surgical History:  Procedure Laterality Date  . ABDOMINAL HYSTERECTOMY    . cervical fusion 2013 - Dr Vertell Limber    . Normal Heart Cath  2001   Dr. Johnsie Cancel  . s/p laser tx for glaucoma     no vision loss    OB History    No data available        Home  Medications    Prior to Admission medications   Medication Sig Start Date End Date Taking? Authorizing Provider  albuterol (PROVENTIL HFA) 108 (90 BASE) MCG/ACT inhaler Inhale 2 puffs into the lungs every 6 (six) hours as needed for wheezing or shortness of breath. 10/11/14  Yes Biagio Borg, MD  albuterol (PROVENTIL) (2.5 MG/3ML) 0.083% nebulizer solution Take 3 mLs (2.5 mg total) by nebulization every 6 (six) hours as needed for wheezing or shortness of breath.  10/11/14  Yes Biagio Borg, MD  aspirin 81 MG EC tablet Take 81 mg by mouth daily.     Yes Historical Provider, MD  atorvastatin (LIPITOR) 20 MG tablet TAKE 1 TABLET BY MOUTH ONCE DAILY 12/31/15  Yes Biagio Borg, MD  budesonide-formoterol [facility]) 160-4.5 MCG/ACT inhaler Inhale 2 puffs into the lungs 2 (two) times daily. 02/12/16  Yes Biagio Borg, MD  clonazePAM (KLONOPIN) 0.5 MG tablet Take 1 tablet (0.5 mg total) by mouth 2 (two) times daily as needed for anxiety. 05/15/16  Yes Biagio Borg, MD  donepezil (ARICEPT) 10 MG tablet Take 1 tablet daily 04/16/16  Yes Cameron Sprang, MD  escitalopram (LEXAPRO) 20 MG tablet TAKE 1 TABLET BY MOUTH ONCE DAILY 01/11/16  Yes Biagio Borg, MD  fexofenadine (ALLEGRA) 180 MG tablet Take 180 mg by mouth daily.     Yes Historical Provider, MD  HYDROcodone-homatropine (HYCODAN) 5-1.5 MG/5ML syrup Take 5 mLs by mouth every 6 (six) hours as needed for cough. 07/16/16 07/26/16 Yes Biagio Borg, MD  hydrOXYzine (ATARAX/VISTARIL) 10 MG tablet Take 10 mg by mouth 3 (three) times daily as needed for anxiety. 06/25/16  Yes Historical Provider, MD  levofloxacin (LEVAQUIN) 500 MG tablet Take 1 tablet (500 mg total) by mouth daily. 07/16/16 07/26/16 Yes Biagio Borg, MD  montelukast (SINGULAIR) 10 MG tablet TAKE 1 TABLET BY MOUTH EVERY NIGHT AT BEDTIME 03/26/16  Yes Biagio Borg, MD  predniSONE (DELTASONE) 10 MG tablet 4tab by mouth x3day,3tab x 3day,2tab x 3day,1tab x 3day 07/16/16  Yes Biagio Borg, MD  tiotropium (SPIRIVA HANDIHALER) 18 MCG inhalation capsule Place 1 capsule (18 mcg total) into inhaler and inhale daily. 05/15/16  Yes Biagio Borg, MD      Family History  Problem Relation Age of Onset  . Dementia Mother   . Rheum arthritis Maternal Uncle   . Colon cancer Neg Hx      Social History  Substance Use Topics  . Smoking status: Former Smoker    Packs/day: 1.00    Years: 40.00    Types: Cigarettes    Start date: 07/03/2015  . Smokeless tobacco: Never  Used  . Alcohol use No     Allergies     Pravastatin; Simvastatin; and Statins    Review of Systems  10 Systems reviewed and are negative for acute change except as noted in the HPI.   Physical Exam Updated Vital Signs BP 115/74   Pulse 83   Temp 98.2 F (36.8 C) (Oral)   Resp 18   SpO2 92%   Physical Exam  Constitutional: She is oriented to person, place, and time. She appears well-developed and well-nourished. No distress.  HENT:  Head: Normocephalic and atraumatic.  Moist mucous membranes Thrush on tongue  Eyes: Conjunctivae are normal. Pupils are equal, round, and reactive to light.  Neck: Neck supple.  Cardiovascular: Normal rate, regular rhythm and normal heart sounds.   No murmur heard. Pulmonary/Chest: Effort normal. She has wheezes.  Inspiratory and expiratory wheezes bilaterally, good air movement  Abdominal: Soft. Bowel sounds are normal. She exhibits no distension. There is no tenderness.  Musculoskeletal: She exhibits no edema.  Neurological: She is alert and oriented to person, place, and time.  Fluent speech  Skin: Skin is warm and dry.  Psychiatric: She has a normal mood and affect. Judgment normal.  Nursing note and vitals reviewed.     ED Treatments / Results  Labs (all labs ordered are listed, but only abnormal results are displayed) Labs Reviewed  CBC - Abnormal; Notable for the following:       Result Value   Hemoglobin 15.7 (*)    HCT 48.1 (*)    All other components within normal limits  BASIC METABOLIC PANEL  BRAIN NATRIURETIC PEPTIDE  I-STAT TROPOININ, ED  I-STAT TROPOININ, ED     EKG  EKG Interpretation  Date/Time:  Thursday July 24 2016 14:10:31 EST Ventricular Rate:  75 PR Interval:  108 QRS Duration: 90 QT Interval:  364 QTC Calculation: 406 R Axis:   74 Text Interpretation:  Sinus rhythm with short PR Otherwise normal ECG No previous ECGs available Confirmed by Kyree Fedorko MD, Teo Moede [phone]) on 07/24/2016 5:50:51  PM         Radiology Dg Chest 2 View  Result Date: 07/24/2016 CLINICAL DATA:  Shortness of breath and cardiac arrhythmia EXAM: CHEST  2 VIEW COMPARISON:  July 16, 2016 FINDINGS: There is scarring in the medial left upper lobe region. There is no edema or consolidation. Heart size and pulmonary vascularity are normal. There is atherosclerotic calcification in the aorta. No adenopathy. There is thoracolumbar levoscoliosis. There is postoperative change in the lower cervical spine. IMPRESSION: Scarring left upper lobe. No edema or consolidation. Aortic atherosclerosis. Electronically Signed   By: Lowella Grip III M.D.   On: 07/24/2016 14:57    Procedures Procedures (including critical care time) Procedures  Medications Ordered in ED  Medications  ipratropium-albuterol (DUONEB) 0.5-2.5 (3) MG/3ML nebulizer solution 3 mL (3 mLs Nebulization Given 07/24/16 1927)  predniSONE (DELTASONE) tablet 40 mg (40 mg Oral Given 07/24/16 1927)     Initial Impression / Assessment and Plan / ED Course  I have reviewed the triage vital signs and the nursing notes.  Pertinent labs & imaging results that were available during my care of the patient were reviewed by me and considered in my medical decision making (see chart for details).  Clinical Course    PT w/ recent SOB and 1 week of heart racing sensation. She was well-appearing and comfortable on exam, O2 saturation 94% on room air. Normal work of breathing. She had inspiratory and expiratory wheezes but good air movement. Regular rate and rhythm,, the patient stated that she was currently feeling heart racing sensation but she is in normal sinus rhythm on the monitor with a normal EKG. Gave DuoNeb and prednisone and obtained above lab work including serial troponins, BNP. All labwork was unremarkable including no evidence of anemia. Chest x-ray negative for acute process. On reexamination her wheezing has improved after breathing treatment. I  suspect COPD exacerbation based on her pulmonary exam and symptoms. She has had no chest pain or exertional fatigue to suggest ACS. Discussed treatment for COPD exacerbation with prednisone and albuterol treatments at home. She does have oral thrush and I discussed nystatin treatment as well as importance of rinsing mouth after using inhaled steroids. Dr. to follow-up with PCP and reviewed return precautions. Patient voiced understanding and was discharged in  satisfactory condition.  Final Clinical Impressions(s) / ED Diagnoses   Final diagnoses:  COPD exacerbation ([place])  Oral thrush     New Prescriptions   No medications on file       Sharlett Iles, MD 07/24/16 2112

## 2016-07-24 NOTE — ED Notes (Signed)
ED Provider at bedside. 

## 2016-07-24 NOTE — Telephone Encounter (Signed)
Please advise 

## 2016-07-24 NOTE — Telephone Encounter (Signed)
[place] for working this afternoon, will need ecg

## 2016-07-24 NOTE — Telephone Encounter (Signed)
Ok to see Friday if pt not already to [facility] or ED

## 2016-07-25 NOTE — Telephone Encounter (Signed)
Pt was seen at ED; ok to hold on further calls

## 2016-07-25 NOTE — Telephone Encounter (Signed)
Tried to call patient at both phone numbers.  Left VM on both for patient to call back if she would like to be worked in with Dr. Jenny Reichmann today.  Patient does have appt for next week.

## 2016-07-26 ENCOUNTER — Emergency Department (HOSPITAL_COMMUNITY): Payer: Medicare Other

## 2016-07-26 ENCOUNTER — Inpatient Hospital Stay (HOSPITAL_COMMUNITY)
Admission: EM | Admit: 2016-07-26 | Discharge: 2016-07-28 | DRG: 190 | Disposition: A | Discharge status: Home / Self Care | Payer: Medicare Other | Attending: Internal Medicine | Admitting: Internal Medicine

## 2016-07-26 ENCOUNTER — Encounter (HOSPITAL_COMMUNITY): Payer: Self-pay | Admitting: *Deleted

## 2016-07-26 ENCOUNTER — Ambulatory Visit (INDEPENDENT_AMBULATORY_CARE_PROVIDER_SITE_OTHER): Payer: Medicare Other | Admitting: Internal Medicine

## 2016-07-26 DIAGNOSIS — I5030 Unspecified diastolic (congestive) heart failure: Secondary | ICD-10-CM | POA: Diagnosis present

## 2016-07-26 DIAGNOSIS — E785 Hyperlipidemia, unspecified: Secondary | ICD-10-CM | POA: Diagnosis present

## 2016-07-26 DIAGNOSIS — Z87891 Personal history of nicotine dependence: Secondary | ICD-10-CM | POA: Diagnosis not present

## 2016-07-26 DIAGNOSIS — F419 Anxiety disorder, unspecified: Secondary | ICD-10-CM

## 2016-07-26 DIAGNOSIS — Z7982 Long term (current) use of aspirin: Secondary | ICD-10-CM | POA: Diagnosis not present

## 2016-07-26 DIAGNOSIS — Z981 Arthrodesis status: Secondary | ICD-10-CM

## 2016-07-26 DIAGNOSIS — J9602 Acute respiratory failure with hypercapnia: Secondary | ICD-10-CM

## 2016-07-26 DIAGNOSIS — J441 Chronic obstructive pulmonary disease with (acute) exacerbation: Secondary | ICD-10-CM | POA: Diagnosis not present

## 2016-07-26 DIAGNOSIS — F411 Generalized anxiety disorder: Secondary | ICD-10-CM | POA: Diagnosis present

## 2016-07-26 DIAGNOSIS — R0600 Dyspnea, unspecified: Secondary | ICD-10-CM | POA: Diagnosis not present

## 2016-07-26 DIAGNOSIS — G4730 Sleep apnea, unspecified: Secondary | ICD-10-CM | POA: Diagnosis present

## 2016-07-26 DIAGNOSIS — Z7952 Long term (current) use of systemic steroids: Secondary | ICD-10-CM | POA: Diagnosis not present

## 2016-07-26 DIAGNOSIS — R0789 Other chest pain: Secondary | ICD-10-CM | POA: Diagnosis not present

## 2016-07-26 DIAGNOSIS — R079 Chest pain, unspecified: Secondary | ICD-10-CM | POA: Diagnosis not present

## 2016-07-26 DIAGNOSIS — R609 Edema, unspecified: Secondary | ICD-10-CM | POA: Diagnosis present

## 2016-07-26 DIAGNOSIS — J9601 Acute respiratory failure with hypoxia: Secondary | ICD-10-CM | POA: Diagnosis present

## 2016-07-26 DIAGNOSIS — E440 Moderate protein-calorie malnutrition: Secondary | ICD-10-CM | POA: Diagnosis present

## 2016-07-26 DIAGNOSIS — B370 Candidal stomatitis: Secondary | ICD-10-CM | POA: Diagnosis present

## 2016-07-26 DIAGNOSIS — Z888 Allergy status to other drugs, medicaments and biological substances status: Secondary | ICD-10-CM | POA: Diagnosis not present

## 2016-07-26 DIAGNOSIS — M1990 Unspecified osteoarthritis, unspecified site: Secondary | ICD-10-CM | POA: Diagnosis present

## 2016-07-26 DIAGNOSIS — R0602 Shortness of breath: Secondary | ICD-10-CM | POA: Diagnosis not present

## 2016-07-26 DIAGNOSIS — F329 Major depressive disorder, single episode, unspecified: Secondary | ICD-10-CM | POA: Diagnosis present

## 2016-07-26 DIAGNOSIS — F0390 Unspecified dementia without behavioral disturbance: Secondary | ICD-10-CM | POA: Diagnosis present

## 2016-07-26 DIAGNOSIS — F17210 Nicotine dependence, cigarettes, uncomplicated: Secondary | ICD-10-CM | POA: Diagnosis present

## 2016-07-26 DIAGNOSIS — M810 Age-related osteoporosis without current pathological fracture: Secondary | ICD-10-CM | POA: Diagnosis present

## 2016-07-26 DIAGNOSIS — R413 Other amnesia: Secondary | ICD-10-CM | POA: Diagnosis present

## 2016-07-26 DIAGNOSIS — J440 Chronic obstructive pulmonary disease with acute lower respiratory infection: Secondary | ICD-10-CM | POA: Diagnosis present

## 2016-07-26 DIAGNOSIS — F32A Depression, unspecified: Secondary | ICD-10-CM | POA: Diagnosis present

## 2016-07-26 LAB — CBC
HEMATOCRIT: 46.7 % — AB (ref 36.0–46.0)
HEMOGLOBIN: 15.4 g/dL — AB (ref 12.0–15.0)
MCH: 32.6 pg (ref 26.0–34.0)
MCHC: 33 g/dL (ref 30.0–36.0)
MCV: 98.9 fL (ref 78.0–100.0)
Platelets: 215 10*3/uL (ref 150–400)
RBC: 4.72 MIL/uL (ref 3.87–5.11)
RDW: 13.8 % (ref 11.5–15.5)
WBC: 9.1 10*3/uL (ref 4.0–10.5)

## 2016-07-26 LAB — CBC WITH DIFFERENTIAL/PLATELET
BASOS ABS: 0 10*3/uL (ref 0.0–0.1)
BASOS PCT: 0 %
EOS ABS: 0 10*3/uL (ref 0.0–0.7)
EOS PCT: 0 %
HCT: 48.8 % — ABNORMAL HIGH (ref 36.0–46.0)
Hemoglobin: 15.9 g/dL — ABNORMAL HIGH (ref 12.0–15.0)
LYMPHS PCT: 7 %
Lymphs Abs: 0.6 10*3/uL — ABNORMAL LOW (ref 0.7–4.0)
MCH: 32.4 pg (ref 26.0–34.0)
MCHC: 32.6 g/dL (ref 30.0–36.0)
MCV: 99.4 fL (ref 78.0–100.0)
Monocytes Absolute: 0.5 10*3/uL (ref 0.1–1.0)
Monocytes Relative: 5 %
Neutro Abs: 8.5 10*3/uL — ABNORMAL HIGH (ref 1.7–7.7)
Neutrophils Relative %: 88 %
PLATELETS: 222 10*3/uL (ref 150–400)
RBC: 4.91 MIL/uL (ref 3.87–5.11)
RDW: 13.8 % (ref 11.5–15.5)
WBC: 9.7 10*3/uL (ref 4.0–10.5)

## 2016-07-26 LAB — BASIC METABOLIC PANEL
Anion gap: 10 (ref 5–15)
BUN: 15 mg/dL (ref 6–20)
CALCIUM: 9.3 mg/dL (ref 8.9–10.3)
CO2: 32 mmol/L (ref 22–32)
Chloride: 98 mmol/L — ABNORMAL LOW (ref 101–111)
Creatinine, Ser: 0.43 mg/dL — ABNORMAL LOW (ref 0.44–1.00)
GFR calc Af Amer: >60 mL/min (ref >60)
Glucose, Bld: 89 mg/dL (ref 65–99)
POTASSIUM: 4.1 mmol/L (ref 3.5–5.1)
SODIUM: 140 mmol/L (ref 135–145)

## 2016-07-26 LAB — TSH: TSH: 0.249 u[IU]/mL — ABNORMAL LOW (ref 0.350–4.500)

## 2016-07-26 LAB — TROPONIN I

## 2016-07-26 LAB — BRAIN NATRIURETIC PEPTIDE: B NATRIURETIC PEPTIDE 5: 44.2 pg/mL (ref 0.0–100.0)

## 2016-07-26 LAB — I-STAT TROPONIN, ED: TROPONIN I, POC: 0 ng/mL (ref 0.00–0.08)

## 2016-07-26 LAB — CREATININE, SERUM: Creatinine, Ser: 0.66 mg/dL (ref 0.44–1.00)

## 2016-07-26 LAB — MAGNESIUM: MAGNESIUM: 1.9 mg/dL (ref 1.7–2.4)

## 2016-07-26 MED ORDER — SODIUM CHLORIDE 0.9% FLUSH
3.0000 mL | Freq: Two times a day (BID) | INTRAVENOUS | Status: DC
  Administered 2016-07-26 – 2016-07-27 (×2): 3 mL via INTRAVENOUS

## 2016-07-26 MED ORDER — DONEPEZIL HCL 10 MG PO TABS
10.0000 mg | ORAL_TABLET | Freq: Every day | ORAL | Status: DC
  Administered 2016-07-26 – 2016-07-27 (×2): 10 mg via ORAL
  Filled 2016-07-26 (×2): qty 1

## 2016-07-26 MED ORDER — SODIUM CHLORIDE 0.9% FLUSH: 3.0000 mL | INTRAVENOUS | Status: DC | PRN

## 2016-07-26 MED ORDER — MONTELUKAST SODIUM 10 MG PO TABS
10.0000 mg | ORAL_TABLET | Freq: Every day | ORAL | Status: DC
  Administered 2016-07-26 – 2016-07-27 (×2): 10 mg via ORAL
  Filled 2016-07-26 (×2): qty 1

## 2016-07-26 MED ORDER — DM-GUAIFENESIN ER 30-600 MG PO TB12
2.0000 | ORAL_TABLET | Freq: Two times a day (BID) | ORAL | Status: DC
  Administered 2016-07-26 – 2016-07-28 (×4): 2 via ORAL
  Filled 2016-07-26 (×5): qty 2

## 2016-07-26 MED ORDER — CLONAZEPAM 0.5 MG PO TABS
0.5000 mg | ORAL_TABLET | Freq: Three times a day (TID) | ORAL | Status: DC | PRN
  Administered 2016-07-26: 0.5 mg via ORAL
  Filled 2016-07-26: qty 1

## 2016-07-26 MED ORDER — ASPIRIN EC 81 MG PO TBEC
81.0000 mg | DELAYED_RELEASE_TABLET | Freq: Every day | ORAL | Status: DC
  Administered 2016-07-26 – 2016-07-28 (×3): 81 mg via ORAL
  Filled 2016-07-26 (×3): qty 1

## 2016-07-26 MED ORDER — METHYLPREDNISOLONE SODIUM SUCC 125 MG IJ SOLR
125.0000 mg | Freq: Once | INTRAMUSCULAR | Status: AC
  Administered 2016-07-26: 125 mg via INTRAVENOUS
  Filled 2016-07-26: qty 2

## 2016-07-26 MED ORDER — IPRATROPIUM BROMIDE 0.02 % IN SOLN
0.5000 mg | Freq: Once | RESPIRATORY_TRACT | Status: AC
  Administered 2016-07-26: 0.5 mg via RESPIRATORY_TRACT
  Filled 2016-07-26: qty 2.5

## 2016-07-26 MED ORDER — HYDROXYZINE HCL 10 MG PO TABS
10.0000 mg | ORAL_TABLET | Freq: Three times a day (TID) | ORAL | Status: DC | PRN
  Filled 2016-07-26: qty 1

## 2016-07-26 MED ORDER — ESCITALOPRAM OXALATE 10 MG PO TABS
20.0000 mg | ORAL_TABLET | Freq: Every day | ORAL | Status: DC
  Administered 2016-07-27 – 2016-07-28 (×2): 20 mg via ORAL
  Filled 2016-07-26 (×3): qty 2

## 2016-07-26 MED ORDER — TIOTROPIUM BROMIDE MONOHYDRATE 18 MCG IN CAPS
18.0000 ug | ORAL_CAPSULE | Freq: Every day | RESPIRATORY_TRACT | Status: DC
  Administered 2016-07-28: 18 ug via RESPIRATORY_TRACT
  Filled 2016-07-26: qty 5

## 2016-07-26 MED ORDER — SODIUM CHLORIDE 0.9% FLUSH
3.0000 mL | Freq: Two times a day (BID) | INTRAVENOUS | Status: DC
  Administered 2016-07-27: 3 mL via INTRAVENOUS

## 2016-07-26 MED ORDER — LEVALBUTEROL HCL 0.63 MG/3ML IN NEBU
0.6300 mg | INHALATION_SOLUTION | Freq: Four times a day (QID) | RESPIRATORY_TRACT | Status: DC
  Administered 2016-07-26 – 2016-07-28 (×8): 0.63 mg via RESPIRATORY_TRACT
  Filled 2016-07-26 (×8): qty 3

## 2016-07-26 MED ORDER — MOMETASONE FURO-FORMOTEROL FUM 200-5 MCG/ACT IN AERO
2.0000 | INHALATION_SPRAY | Freq: Two times a day (BID) | RESPIRATORY_TRACT | Status: DC
  Administered 2016-07-26: 2 via RESPIRATORY_TRACT
  Filled 2016-07-26: qty 8.8

## 2016-07-26 MED ORDER — NYSTATIN 100000 UNIT/ML MT SUSP
5.0000 mL | Freq: Four times a day (QID) | OROMUCOSAL | Status: DC
  Administered 2016-07-26 – 2016-07-28 (×7): 500000 [IU] via ORAL
  Filled 2016-07-26 (×7): qty 5

## 2016-07-26 MED ORDER — ALBUTEROL (5 MG/ML) CONTINUOUS INHALATION SOLN
10.0000 mg/h | INHALATION_SOLUTION | Freq: Once | RESPIRATORY_TRACT | Status: AC
  Administered 2016-07-26: 10 mg/h via RESPIRATORY_TRACT
  Filled 2016-07-26 (×2): qty 20

## 2016-07-26 MED ORDER — ATORVASTATIN CALCIUM 20 MG PO TABS
20.0000 mg | ORAL_TABLET | Freq: Every day | ORAL | Status: DC
  Administered 2016-07-26 – 2016-07-28 (×3): 20 mg via ORAL
  Filled 2016-07-26 (×3): qty 1

## 2016-07-26 MED ORDER — SODIUM CHLORIDE 0.9 % IV SOLN: 250.0000 mL | INTRAVENOUS | Status: DC | PRN

## 2016-07-26 MED ORDER — METHYLPREDNISOLONE SODIUM SUCC 125 MG IJ SOLR
60.0000 mg | Freq: Four times a day (QID) | INTRAMUSCULAR | Status: AC
  Administered 2016-07-26 – 2016-07-27 (×4): 60 mg via INTRAVENOUS
  Filled 2016-07-26 (×4): qty 2

## 2016-07-26 MED ORDER — HYDROCODONE-HOMATROPINE 5-1.5 MG/5ML PO SYRP: 5.0000 mL | ORAL_SOLUTION | Freq: Four times a day (QID) | ORAL | Status: DC | PRN

## 2016-07-26 MED ORDER — LEVOFLOXACIN IN D5W 750 MG/150ML IV SOLN
750.0000 mg | INTRAVENOUS | Status: DC
  Administered 2016-07-26: 750 mg via INTRAVENOUS
  Filled 2016-07-26: qty 150

## 2016-07-26 MED ORDER — LORATADINE 10 MG PO TABS
10.0000 mg | ORAL_TABLET | Freq: Every day | ORAL | Status: DC
  Administered 2016-07-26 – 2016-07-28 (×3): 10 mg via ORAL
  Filled 2016-07-26 (×3): qty 1

## 2016-07-26 MED ORDER — ENOXAPARIN SODIUM 40 MG/0.4ML ~~LOC~~ SOLN
40.0000 mg | SUBCUTANEOUS | Status: DC
  Administered 2016-07-26 – 2016-07-27 (×2): 40 mg via SUBCUTANEOUS
  Filled 2016-07-26 (×2): qty 0.4

## 2016-07-26 MED ORDER — ALBUTEROL SULFATE (2.5 MG/3ML) 0.083% IN NEBU: 5.0000 mg | INHALATION_SOLUTION | RESPIRATORY_TRACT | Status: AC | PRN

## 2016-07-26 MED ORDER — IPRATROPIUM BROMIDE 0.02 % IN SOLN
0.5000 mg | Freq: Four times a day (QID) | RESPIRATORY_TRACT | Status: DC
  Administered 2016-07-26 – 2016-07-27 (×6): 0.5 mg via RESPIRATORY_TRACT
  Filled 2016-07-26 (×6): qty 2.5

## 2016-07-26 NOTE — H&P (Signed)
[facility] History and Physical  Michele Martin NKN:[id number] DOB: [birth year] DOA: 07/26/2016  PCP: Cathlean Cower, MD  Patient coming from: Home  Chief Complaint: Shortness of breath  HPI: Michele Martin is a 70 y.o. female with a medical history of COPD, chronic bronchitis, anxiety/depression, presented to the emergency department with complaints of shortness of breath and palpitations. Patient states she does have home oxygen however does not use it. She was using this at night for her sleep apnea. Patient states that over the last several days to weeks, she has been having more shortness of breath with cough. Cough has been productive. She has been seeing her primary care physician and has tried antibiotics as well as prednisone. Patient was trying to avoid admission. She does endorse getting her flu and pneumonia vaccinations. Patient was found to have oxygen saturations of 86% while her primary care physician's office, 56% while ambulating. She denies any recent ill contacts, immobilization, travel, denies chest pain, nausea, vomiting, dizziness, abdominal pain, constipation, diarrhea. Patient states she did use her nebulizer machine twice daily with little benefit.  ED Course: Found to have COPD exacerbation, given hour-long nebulizer treatment, placed on oxygen, Solu-Medrol. [facility] called for admission  Review of Systems:  All other systems reviewed and are negative.   Past Medical History:  Diagnosis Date  . Acute angle-closure glaucoma 02/14/2010  . ALLERGIC RHINITIS 09/19/2009  . ANXIETY 04/15/2010  . Arthritis   . ASTHMA 09/19/2009  . BACK PAIN 02/14/2010  . Chronic bronchitis 04/14/2011  . CHRONIC OBSTRUCTIVE PULMONARY DISEASE, ACUTE EXACERBATION 12/19/2009  . Complication of anesthesia   . COPD 06/27/2009  . DEPRESSION 09/19/2009  . HOARSENESS 10/17/2009  . HYPERLIPIDEMIA 09/19/2009  . MENOPAUSAL DISORDER 08/20/2010  . OSTEOPENIA 09/10/2010  . Osteoporosis, unspecified  04/20/2014  . Rotator cuff tear 06/23/2011  . TINNITUS 09/10/2010  . TREMOR 02/14/2010  . Tremor 07/30/2011  . URI 09/10/2010  . Wheezing 06/27/2009    Past Surgical History:  Procedure Laterality Date  . ABDOMINAL HYSTERECTOMY    . cervical fusion 2013 - Dr Vertell Limber    . Normal Heart Cath  2001   Dr. Johnsie Cancel  . s/p laser tx for glaucoma     no vision loss    Social History:  reports that she has quit smoking. Her smoking use included Cigarettes. She started smoking about 12 months ago. She has a 40.00 pack-year smoking history. She has never used smokeless tobacco. She reports that she does not drink alcohol or use drugs.   Allergies  Allergen Reactions  . Pravastatin Itching and Other (See Comments)    Myalgias/cramping  . Simvastatin Itching and Other (See Comments)    Myalgias/cramping  . Statins Itching and Other (See Comments)    Myalgias/cramping    Family History  Problem Relation Age of Onset  . Dementia Mother   . Rheum arthritis Maternal Uncle   . Colon cancer Neg Hx     Prior to Admission medications   Medication Sig Start Date End Date Taking? Authorizing Provider  albuterol (PROVENTIL HFA) 108 (90 BASE) MCG/ACT inhaler Inhale 2 puffs into the lungs every 6 (six) hours as needed for wheezing or shortness of breath. 10/11/14   Biagio Borg, MD  albuterol (PROVENTIL) (2.5 MG/3ML) 0.083% nebulizer solution Take 3 mLs (2.5 mg total) by nebulization every 6 (six) hours as needed for wheezing or shortness of breath. 10/11/14   Biagio Borg, MD  aspirin 81 MG EC tablet Take 81 mg  by mouth daily.      Historical Provider, MD  atorvastatin (LIPITOR) 20 MG tablet TAKE 1 TABLET BY MOUTH ONCE DAILY 12/31/15   Biagio Borg, MD  budesonide-formoterol [facility]) 160-4.5 MCG/ACT inhaler Inhale 2 puffs into the lungs 2 (two) times daily. 02/12/16   Biagio Borg, MD  clonazePAM (KLONOPIN) 0.5 MG tablet Take 1 tablet (0.5 mg total) by mouth 2 (two) times daily as needed for anxiety. 05/15/16    Biagio Borg, MD  donepezil (ARICEPT) 10 MG tablet Take 1 tablet daily 04/16/16   Cameron Sprang, MD  escitalopram (LEXAPRO) 20 MG tablet TAKE 1 TABLET BY MOUTH ONCE DAILY 01/11/16   Biagio Borg, MD  fexofenadine (ALLEGRA) 180 MG tablet Take 180 mg by mouth daily.      Historical Provider, MD  HYDROcodone-homatropine (HYCODAN) 5-1.5 MG/5ML syrup Take 5 mLs by mouth every 6 (six) hours as needed for cough. 07/16/16 07/26/16  Biagio Borg, MD  hydrOXYzine (ATARAX/VISTARIL) 10 MG tablet Take 10 mg by mouth 3 (three) times daily as needed for anxiety. 06/25/16   Historical Provider, MD  levofloxacin (LEVAQUIN) 500 MG tablet Take 1 tablet (500 mg total) by mouth daily. 07/16/16 07/26/16  Biagio Borg, MD  montelukast (SINGULAIR) 10 MG tablet TAKE 1 TABLET BY MOUTH EVERY NIGHT AT BEDTIME 03/26/16   Biagio Borg, MD  nystatin (MYCOSTATIN) 100000 UNIT/ML suspension Take 5 mLs (500,000 Units total) by mouth 4 (four) times daily. Swish in mouth and spit out 07/24/16   Sharlett Iles, MD  predniSONE (DELTASONE) 20 MG tablet Take 2 tablets (40 mg total) by mouth daily. 07/24/16   Sharlett Iles, MD  tiotropium (SPIRIVA HANDIHALER) 18 MCG inhalation capsule Place 1 capsule (18 mcg total) into inhaler and inhale daily. 05/15/16   Biagio Borg, MD    Physical Exam: Vitals:   07/26/16 1123 07/26/16 1240  BP: 126/77   Pulse: 82 78  Resp: 17 18  Temp: 98.3 F (36.8 C)      General: Well developed, well nourished, NAD, appears stated age  HEENT: NCAT, PERRLA, EOMI, Anicteic Sclera, mucous membranes moist.   Neck: Supple, no JVD, no masses  Cardiovascular: S1 S2 auscultated, no rubs, murmurs or gallops. Tachycardic  Respiratory: Diffuse expiratory wheezing, decreased breath sounds  Abdomen: Soft, nontender, nondistended, + bowel sounds  Extremities: warm dry without cyanosis clubbing. Trace LE edema.  Neuro: AAOx3, cranial nerves grossly intact. Strength 5/5 in patient's upper and lower  extremities bilaterally  Skin: Without rashes exudates or nodules, areas of ecchymosis on patient's left arm  Psych: Normal affect and demeanor with intact judgement and insight  Labs on Admission: I have personally reviewed following labs and imaging studies CBC:  Recent Labs Lab 07/24/16 1418 07/26/16 1225  WBC 8.1 9.7  NEUTROABS  --  8.5*  HGB 15.7* 15.9*  HCT 48.1* 48.8*  MCV 99.8 99.4  PLT 219 101   Basic Metabolic Panel:  Recent Labs Lab 07/24/16 1418 07/26/16 1225  NA 140 140  K 4.3 4.1  CL 101 98*  CO2 31 32  GLUCOSE 97 89  BUN 14 15  CREATININE 0.62 0.43*  CALCIUM 9.7 9.3   GFR: Estimated Creatinine Clearance: 48.7 mL/min (by C-G formula based on SCr of 0.43 mg/dL (L)). Liver Function Tests: No results for input(s): AST, ALT, ALKPHOS, BILITOT, PROT, ALBUMIN in the last 168 hours. No results for input(s): LIPASE, AMYLASE in the last 168 hours. No results for input(s):  AMMONIA in the last 168 hours. Coagulation Profile: No results for input(s): INR, PROTIME in the last 168 hours. Cardiac Enzymes: No results for input(s): CKTOTAL, CKMB, CKMBINDEX, TROPONINI in the last 168 hours. BNP (last 3 results) No results for input(s): PROBNP in the last 8760 hours. HbA1C: No results for input(s): HGBA1C in the last 72 hours. CBG: No results for input(s): GLUCAP in the last 168 hours. Lipid Profile: No results for input(s): CHOL, HDL, LDLCALC, TRIG, CHOLHDL, LDLDIRECT in the last 72 hours. Thyroid Function Tests: No results for input(s): TSH, T4TOTAL, FREET4, T3FREE, THYROIDAB in the last 72 hours. Anemia Panel: No results for input(s): VITAMINB12, FOLATE, FERRITIN, TIBC, IRON, RETICCTPCT in the last 72 hours. Urine analysis:    Component Value Date/Time   COLORURINE YELLOW 08/09/2015 1504   APPEARANCEUR CLEAR 08/09/2015 1504   LABSPEC <=1.005 (A) 08/09/2015 1504   PHURINE 6.0 08/09/2015 1504   GLUCOSEU NEGATIVE 08/09/2015 1504   HGBUR NEGATIVE 08/09/2015  1504   BILIRUBINUR NEGATIVE 08/09/2015 1504   KETONESUR NEGATIVE 08/09/2015 1504   UROBILINOGEN 0.2 08/09/2015 1504   NITRITE NEGATIVE 08/09/2015 1504   LEUKOCYTESUR NEGATIVE 08/09/2015 1504   Sepsis Labs: '@LABRCNTIP'$ (procalcitonin:4,lacticidven:4) )No results found for this or any previous visit (from the past 240 hour(s)).   Radiological Exams on Admission: Dg Chest 2 View  Result Date: 07/26/2016 CLINICAL DATA:  Pt c/o continuing chest pain. Pt sent from her PCP due to COPD exacerbation. Pt was [facility] at [facility] 2 days ago and left AMA. Pt still SOB and reports heart is pounding. Pt had home albuterol treatment this morning. EXAM: CHEST  2 VIEW COMPARISON:  07/24/2016 FINDINGS: Lungs are hyperinflated. There is perihilar peribronchial thickening. There are no focal consolidations or pleural effusions. No pulmonary edema. Status post cervical fusion. IMPRESSION: 1. Hyperinflation and bronchitic changes. 2.  No focal acute pulmonary abnormality. Electronically Signed   By: Nolon Nations M.D.   On: 07/26/2016 12:09   Dg Chest 2 View  Result Date: 07/24/2016 CLINICAL DATA:  Shortness of breath and cardiac arrhythmia EXAM: CHEST  2 VIEW COMPARISON:  July 16, 2016 FINDINGS: There is scarring in the medial left upper lobe region. There is no edema or consolidation. Heart size and pulmonary vascularity are normal. There is atherosclerotic calcification in the aorta. No adenopathy. There is thoracolumbar levoscoliosis. There is postoperative change in the lower cervical spine. IMPRESSION: Scarring left upper lobe. No edema or consolidation. Aortic atherosclerosis. Electronically Signed   By: Lowella Grip III M.D.   On: 07/24/2016 14:57    EKG: Independently reviewed. Sinus rhythm, rate 87  Assessment/Plan  Acute hypoxic respiratory failure/COPD exacerbation -Patient presented to her primary care physician's office, noted to have 86% saturation on room air, upon ambulation 56% -Patient  did have recent emergency department visit 2 days ago and left AGAINST MEDICAL ADVICE -Chest x-ray: COPD with bronchitic changes, no infection noted -COPD order set utilized -Continue supplemental oxygen to maintain saturations above 92% -Will place on solu-medrol,dulera, levaquin, nebs, antitussives  Palpitations  -Possibly secondary to COPD exacerbation -Will obtain TSH, magnesium levels, echocardiogram -Troponin negative, continue to cycle  Anxiety/depression -Continue Klonopin, increase to 3 times daily while in the hospital -Continue Lexapro  Memory dysfunction/Dementia -Continue Aricept  Oral thrush -Recently started on nystatin, will continue  Hyperlipidemia -Continue statin  Tobacco abuse -Discussed smoking cessation -Nicotine patch offered, patient declined  Sleep apnea -Patient was using oxygen nightly, has not used to C Pap machine before. Discussed using, declined. -Likely needs an outpatient  sleep evaluation upon discharge  DVT prophylaxis: Lovenox  Code Status: Full  Family Communication: Family at bedside. Admission, patients condition and plan of care including tests being ordered have been discussed with the patient and family who indicate understanding and agree with the plan and Code Status.  Disposition Plan: Home when stable   Consults called: None   Admission status: Inpatient   Time spent: 70 minutes  Deaundre Allston D.O. [facility] Pager [phone]  If 7PM-7AM, please contact night-coverage [email or web address] Password [email or web address] 07/26/2016, 2:16 PM

## 2016-07-26 NOTE — Progress Notes (Signed)
Subjective:    Patient ID: Michele Martin, female    DOB: 01-Dec-1945, 70 y.o.   MRN: [id number]  HPI  Here with several days acute onset gradually worsening cough, sob, wheezing and tightness, seen at ED yesterday with likely secondary palp's without CP, but today with worsening sob/doe/wheezing/tightness; does not have home o2 she uses (though may have the equipment, pt vague on this), but RA sat on arrival is 84%, dropping to 65% with ambulation.  Pt has been trying to avoid hospn and will not go to [facility], but would be willing for [facility] if able.  No hx of signficant Cardiac hx.  Past Medical History:  Diagnosis Date  . Acute angle-closure glaucoma 02/14/2010  . ALLERGIC RHINITIS 09/19/2009  . ANXIETY 04/15/2010  . Arthritis   . ASTHMA 09/19/2009  . BACK PAIN 02/14/2010  . Chronic bronchitis 04/14/2011  . CHRONIC OBSTRUCTIVE PULMONARY DISEASE, ACUTE EXACERBATION 12/19/2009  . Complication of anesthesia   . COPD 06/27/2009  . DEPRESSION 09/19/2009  . HOARSENESS 10/17/2009  . HYPERLIPIDEMIA 09/19/2009  . MENOPAUSAL DISORDER 08/20/2010  . OSTEOPENIA 09/10/2010  . Osteoporosis, unspecified 04/20/2014  . Rotator cuff tear 06/23/2011  . TINNITUS 09/10/2010  . TREMOR 02/14/2010  . Tremor 07/30/2011  . URI 09/10/2010  . Wheezing 06/27/2009   Past Surgical History:  Procedure Laterality Date  . ABDOMINAL HYSTERECTOMY    . cervical fusion 2013 - Dr Vertell Limber    . Normal Heart Cath  2001   Dr. Johnsie Cancel  . s/p laser tx for glaucoma     no vision loss    reports that she has quit smoking. Her smoking use included Cigarettes. She started smoking about 12 months ago. She has a 40.00 pack-year smoking history. She has never used smokeless tobacco. She reports that she does not drink alcohol or use drugs. family history includes Dementia in her mother; Rheum arthritis in her maternal uncle. Allergies  Allergen Reactions  . Pravastatin Itching and Other (See Comments)    Myalgias/cramping  . Simvastatin  Itching and Other (See Comments)    Myalgias/cramping  . Statins Itching and Other (See Comments)    Myalgias/cramping   Current Outpatient Prescriptions on File Prior to Visit  Medication Sig Dispense Refill  . albuterol (PROVENTIL HFA) 108 (90 BASE) MCG/ACT inhaler Inhale 2 puffs into the lungs every 6 (six) hours as needed for wheezing or shortness of breath. 18 g 5  . albuterol (PROVENTIL) (2.5 MG/3ML) 0.083% nebulizer solution Take 3 mLs (2.5 mg total) by nebulization every 6 (six) hours as needed for wheezing or shortness of breath. 150 mL 5  . aspirin 81 MG EC tablet Take 81 mg by mouth daily.      [name] atorvastatin (LIPITOR) 20 MG tablet TAKE 1 TABLET BY MOUTH ONCE DAILY 90 tablet 3  . budesonide-formoterol (SYMBICORT) 160-4.5 MCG/ACT inhaler Inhale 2 puffs into the lungs 2 (two) times daily. 3 Inhaler 3  . clonazePAM (KLONOPIN) 0.5 MG tablet Take 1 tablet (0.5 mg total) by mouth 2 (two) times daily as needed for anxiety. 60 tablet 1  . donepezil (ARICEPT) 10 MG tablet Take 1 tablet daily 90 tablet 3  . escitalopram (LEXAPRO) 20 MG tablet TAKE 1 TABLET BY MOUTH ONCE DAILY 90 tablet 0  . fexofenadine (ALLEGRA) 180 MG tablet Take 180 mg by mouth daily.      [name] HYDROcodone-homatropine (HYCODAN) 5-1.5 MG/5ML syrup Take 5 mLs by mouth every 6 (six) hours as needed for cough. 180 mL 0  .  hydrOXYzine (ATARAX/VISTARIL) 10 MG tablet Take 10 mg by mouth 3 (three) times daily as needed for anxiety.    [name] levofloxacin (LEVAQUIN) 500 MG tablet Take 1 tablet (500 mg total) by mouth daily. 10 tablet 0  . montelukast (SINGULAIR) 10 MG tablet TAKE 1 TABLET BY MOUTH EVERY NIGHT AT BEDTIME 30 tablet 11  . nystatin (MYCOSTATIN) 100000 UNIT/ML suspension Take 5 mLs (500,000 Units total) by mouth 4 (four) times daily. Swish in mouth and spit out 150 mL 0  . predniSONE (DELTASONE) 20 MG tablet Take 2 tablets (40 mg total) by mouth daily. 10 tablet 0  . tiotropium (SPIRIVA HANDIHALER) 18 MCG inhalation capsule  Place 1 capsule (18 mcg total) into inhaler and inhale daily. 90 capsule 3   No current facility-administered medications on file prior to visit.    Review of Systems All otherwise neg per pt but not done extensively    Objective:   Physical Exam BP 140/80   Pulse 86   Temp 98.1 F (36.7 C) (Oral)   Resp 20   Wt 107 lb (48.5 kg)   SpO2 (!) 88%   BMI 20.22 kg/m  VS noted, + accessory muscle use, pursed lip breathing Constitutional: Pt appears mod ill HENT: Head: NCAT.  Right Ear: External ear normal.  Left Ear: External ear normal.  Eyes: . Pupils are equal, round, and reactive to light. Conjunctivae and EOM are normal Neck: Normal range of motion. Neck supple.  Cardiovascular: Normal rate and regular rhythm.   Pulmonary/Chest: Effort normal and breath sounds decreased bilat without rales but with bilat mild wheezing.  Neurological: Pt is alert. Not confused , motor grossly intact Skin: Skin is warm. No rash, no LE edema Psychiatric: Pt behavior is normal. No agitation.      Assessment & Plan:

## 2016-07-26 NOTE — Assessment & Plan Note (Signed)
With low sat on RA with sitting, worse to 65% with ambulation, now 93% at rest on 2L [place].  D/w pt, will need hospn though she is resistant, but with hypoxia and tachycardia, her risk of demise is high if goes home.

## 2016-07-26 NOTE — Assessment & Plan Note (Addendum)
Will try to give albut neb x 1 now, ambulance service being notified of need for transport, no other specific tx for this now, to go directly to [facility] ED

## 2016-07-26 NOTE — ED Triage Notes (Signed)
Per EMS, pt sent from her PCP due to COPD exacerbation. Pt was seen at [facility] 2 days ago and left AMA. Pt still short of breath and reports heart pounding. Pt had home albuterol treatment this morning and a treatment at her PCP. Pt denies chest pain.  Pt was 86% on RA at PCP. Pt's O2 was 56% while ambulating at PCP.

## 2016-07-26 NOTE — Progress Notes (Signed)
Pre visit review using our clinic review tool, if applicable. No additional management support is needed unless otherwise documented below in the visit note. 

## 2016-07-26 NOTE — Patient Instructions (Signed)
You have been treated with the Albuterol Neb x 1  Please continue all other medications as before, and refills have been done if requested.  Please go with EMS directely to [facility] ED

## 2016-07-26 NOTE — ED Notes (Signed)
Bed: YF74 Expected date:  Expected time:  Means of arrival:  Comments: COPD exacerbation-neb given

## 2016-07-26 NOTE — ED Provider Notes (Signed)
[facility] DEPT Provider Note   CSN: [id number] Arrival date & time: 07/26/16  1110     History   Chief Complaint Chief Complaint  Patient presents with  . Shortness of Breath    HPI Michele Martin is a 70 y.o. female.  HPI   Patient with hx COPD, HLD p/w gradually worsening SOB, wheezing, chest tightness, palpitations (described as heart pounding).  Has been seen by PCP and in ED, has been on Levaquin (current), prednisone, cough syrup, home nebs BID without improvement.  Was seen at PCP today and oxygen found to be 86% on room air and 56% while ambulating.  Pt denies fevers, cough, leg swelling, orthopnea.  Denies recent immoblization, personal or family hx blood clots.  No known hx CA.   Past Medical History:  Diagnosis Date  . Acute angle-closure glaucoma 02/14/2010  . ALLERGIC RHINITIS 09/19/2009  . ANXIETY 04/15/2010  . Arthritis   . ASTHMA 09/19/2009  . BACK PAIN 02/14/2010  . Chronic bronchitis 04/14/2011  . CHRONIC OBSTRUCTIVE PULMONARY DISEASE, ACUTE EXACERBATION 12/19/2009  . Complication of anesthesia   . COPD 06/27/2009  . DEPRESSION 09/19/2009  . HOARSENESS 10/17/2009  . HYPERLIPIDEMIA 09/19/2009  . MENOPAUSAL DISORDER 08/20/2010  . OSTEOPENIA 09/10/2010  . Osteoporosis, unspecified 04/20/2014  . Rotator cuff tear 06/23/2011  . TINNITUS 09/10/2010  . TREMOR 02/14/2010  . Tremor 07/30/2011  . URI 09/10/2010  . Wheezing 06/27/2009    Patient Active Problem List   Diagnosis Date Noted  . Acute respiratory failure with hypoxia ([place]) 07/26/2016  . Grief reaction 05/15/2016  . Rotator cuff tear arthropathy of both shoulders 04/17/2016  . Neck pain on right side 03/13/2016  . Angioedema 10/23/2015  . Cough 09/07/2015  . Benign neoplasm of meninges ([place]) 08/29/2015  . Dizziness and giddiness 08/29/2015  . Mild cognitive impairment 08/10/2015  . Benign paroxysmal positional vertigo 08/10/2015  . Hyperlipidemia 08/10/2015  . Memory dysfunction 07/03/2015  .  Solitary pulmonary nodule 05/09/2015  . Right hip pain 01/23/2015  . Chest pain 10/25/2014  . Right sided sciatica 10/11/2014  . Chronic pain syndrome 10/11/2014  . Osteoporosis 04/20/2014  . COPD exacerbation ([place]) 08/25/2013  . General weakness 12/22/2012  . Personal history of colonic polyps 10/25/2012  . Anterior neck pain 10/25/2012  . COPD GOLD III/ still smoking  08/24/2012  . Shingles 07/14/2012  . Orthostatic hypotension 05/07/2012  . Involuntary movements 11/19/2011  . Tremor 07/30/2011  . Rotator cuff tear 06/23/2011  . Lower back pain 06/23/2011  . Balance problem 06/23/2011  . Smoker 05/22/2011  . Preop exam for internal medicine 05/13/2011  . Chronic bronchitis ([place]) 04/14/2011  . Peripheral edema 01/13/2011  . Right shoulder pain 01/13/2011  . Encounter for well adult exam with abnormal findings 10/31/2010  . TINNITUS 09/10/2010  . MENOPAUSAL DISORDER 08/20/2010  . Anxiety state 04/15/2010  . Acute angle-closure glaucoma 02/14/2010  . HOARSENESS 10/17/2009  . Depression 09/19/2009  . Allergic rhinitis 09/19/2009    Past Surgical History:  Procedure Laterality Date  . ABDOMINAL HYSTERECTOMY    . cervical fusion 2013 - Dr Vertell Limber    . Normal Heart Cath  2001   Dr. Johnsie Cancel  . s/p laser tx for glaucoma     no vision loss    OB History    No data available       Home Medications    Prior to Admission medications   Medication Sig Start Date End Date Taking? Authorizing Provider  albuterol (PROVENTIL HFA)  108 (90 BASE) MCG/ACT inhaler Inhale 2 puffs into the lungs every 6 (six) hours as needed for wheezing or shortness of breath. 10/11/14  Yes Biagio Borg, MD  albuterol (PROVENTIL) (2.5 MG/3ML) 0.083% nebulizer solution Take 3 mLs (2.5 mg total) by nebulization every 6 (six) hours as needed for wheezing or shortness of breath. 10/11/14  Yes Biagio Borg, MD  aspirin 81 MG EC tablet Take 81 mg by mouth daily.     Yes Historical Provider, MD  atorvastatin  (LIPITOR) 20 MG tablet TAKE 1 TABLET BY MOUTH ONCE DAILY 12/31/15  Yes Biagio Borg, MD  budesonide-formoterol [facility]) 160-4.5 MCG/ACT inhaler Inhale 2 puffs into the lungs 2 (two) times daily. 02/12/16  Yes Biagio Borg, MD  clonazePAM (KLONOPIN) 0.5 MG tablet Take 1 tablet (0.5 mg total) by mouth 2 (two) times daily as needed for anxiety. 05/15/16  Yes Biagio Borg, MD  donepezil (ARICEPT) 10 MG tablet Take 1 tablet daily 04/16/16  Yes Cameron Sprang, MD  escitalopram (LEXAPRO) 20 MG tablet TAKE 1 TABLET BY MOUTH ONCE DAILY 01/11/16  Yes Biagio Borg, MD  guaiFENesin (MUCINEX) 600 MG 12 hr tablet Take 1,200 mg by mouth 2 (two) times daily.   Yes Historical Provider, MD  HYDROcodone-homatropine (HYCODAN) 5-1.5 MG/5ML syrup Take 5 mLs by mouth every 6 (six) hours as needed for cough. 07/16/16 07/26/16 Yes Biagio Borg, MD  hydrOXYzine (ATARAX/VISTARIL) 10 MG tablet Take 10 mg by mouth 3 (three) times daily as needed for anxiety. 06/25/16  Yes Historical Provider, MD  montelukast (SINGULAIR) 10 MG tablet TAKE 1 TABLET BY MOUTH EVERY NIGHT AT BEDTIME 03/26/16  Yes Biagio Borg, MD  predniSONE (DELTASONE) 10 MG tablet Take 10 mg by mouth 2 (two) times daily. 07/16/16  Yes Historical Provider, MD  tiotropium (SPIRIVA HANDIHALER) 18 MCG inhalation capsule Place 1 capsule (18 mcg total) into inhaler and inhale daily. 05/15/16  Yes Biagio Borg, MD  levofloxacin (LEVAQUIN) 500 MG tablet Take 1 tablet (500 mg total) by mouth daily. Patient not taking: Reported on 07/26/2016 07/16/16 07/26/16  Biagio Borg, MD  nystatin (MYCOSTATIN) 100000 UNIT/ML suspension Take 5 mLs (500,000 Units total) by mouth 4 (four) times daily. Swish in mouth and spit out Patient not taking: Reported on 07/26/2016 07/24/16   Sharlett Iles, MD  predniSONE (DELTASONE) 20 MG tablet Take 2 tablets (40 mg total) by mouth daily. Patient not taking: Reported on 07/26/2016 07/24/16   Sharlett Iles, MD    Family  History Family History  Problem Relation Age of Onset  . Dementia Mother   . Rheum arthritis Maternal Uncle   . Colon cancer Neg Hx     Social History Social History  Substance Use Topics  . Smoking status: Former Smoker    Packs/day: 1.00    Years: 40.00    Types: Cigarettes    Start date: 07/03/2015  . Smokeless tobacco: Never Used  . Alcohol use No     Allergies   Pravastatin; Simvastatin; and Statins   Review of Systems Review of Systems  All other systems reviewed and are negative.    Physical Exam Updated Vital Signs BP 112/74 (BP Location: Right Arm)   Pulse (!) 108   Temp 98.3 F (36.8 C) (Oral)   Resp (!) 22   Ht '5\' 1"'$  (1.549 m)   Wt 48.5 kg   SpO2 96%   BMI 20.20 kg/m   Physical Exam  Constitutional: She  appears well-developed and well-nourished. No distress.  HENT:  Head: Normocephalic and atraumatic.  Neck: Neck supple.  Cardiovascular: Normal rate and regular rhythm.   Pulmonary/Chest: Effort normal. No respiratory distress. She has wheezes (diffuse loud  expiratory wheezing). She has no rales.  Abdominal: Soft. She exhibits no distension. There is no tenderness. There is no rebound and no guarding.  Neurological: She is alert.  Skin: She is not diaphoretic.  Nursing note and vitals reviewed.    ED Treatments / Results  Labs (all labs ordered are listed, but only abnormal results are displayed) Labs Reviewed  BASIC METABOLIC PANEL - Abnormal; Notable for the following:       Result Value   Chloride 98 (*)    Creatinine, Ser 0.43 (*)    All other components within normal limits  CBC WITH DIFFERENTIAL/PLATELET - Abnormal; Notable for the following:    Hemoglobin 15.9 (*)    HCT 48.8 (*)    Neutro Abs 8.5 (*)    Lymphs Abs 0.6 (*)    All other components within normal limits  CBC - Abnormal; Notable for the following:    Hemoglobin 15.4 (*)    HCT 46.7 (*)    All other components within normal limits  TSH - Abnormal; Notable for  the following:    TSH 0.249 (*)    All other components within normal limits  BRAIN NATRIURETIC PEPTIDE  CREATININE, SERUM  MAGNESIUM  TROPONIN I  TROPONIN I  TROPONIN I  BASIC METABOLIC PANEL  CBC  I-STAT TROPOININ, ED    EKG  EKG Interpretation  Date/Time:  Saturday July 26 2016 12:37:25 EST Ventricular Rate:  87 PR Interval:  118 QRS Duration: 86 QT Interval:  370 QTC Calculation: 445 R Axis:   61 Text Interpretation:  Normal sinus rhythm Normal ECG Confirmed by Hazle Coca [phone]) on 07/26/2016 1:31:26 PM       Radiology Dg Chest 2 View  Result Date: 07/26/2016 CLINICAL DATA:  Pt c/o continuing chest pain. Pt sent from her PCP due to COPD exacerbation. Pt was seen at [facility] 2 days ago and left AMA. Pt still SOB and reports heart is pounding. Pt had home albuterol treatment this morning. EXAM: CHEST  2 VIEW COMPARISON:  07/24/2016 FINDINGS: Lungs are hyperinflated. There is perihilar peribronchial thickening. There are no focal consolidations or pleural effusions. No pulmonary edema. Status post cervical fusion. IMPRESSION: 1. Hyperinflation and bronchitic changes. 2.  No focal acute pulmonary abnormality. Electronically Signed   By: Nolon Nations M.D.   On: 07/26/2016 12:09    Procedures Procedures (including critical care time)  Medications Ordered in ED Medications  albuterol (PROVENTIL) (2.5 MG/3ML) 0.083% nebulizer solution 5 mg (not administered)  hydrOXYzine (ATARAX/VISTARIL) tablet 10 mg (not administered)  nystatin (MYCOSTATIN) 100000 UNIT/ML suspension 500,000 Units (500,000 Units Oral Given 07/26/16 1740)  HYDROcodone-homatropine (HYCODAN) 5-1.5 MG/5ML syrup 5 mL (not administered)  clonazePAM (KLONOPIN) tablet 0.5 mg (not administered)  tiotropium (SPIRIVA) inhalation capsule 18 mcg (not administered)  donepezil (ARICEPT) tablet 10 mg (not administered)  montelukast (SINGULAIR) tablet 10 mg (not administered)  mometasone-formoterol (DULERA) 200-5  MCG/ACT inhaler 2 puff (not administered)  escitalopram (LEXAPRO) tablet 20 mg (not administered)  atorvastatin (LIPITOR) tablet 20 mg (20 mg Oral Given 07/26/16 1740)  aspirin EC tablet 81 mg (81 mg Oral Given 07/26/16 1740)  loratadine (CLARITIN) tablet 10 mg (10 mg Oral Given 07/26/16 1740)  enoxaparin (LOVENOX) injection 40 mg (not administered)  sodium chloride flush (NS) 0.9 %  injection 3 mL (not administered)  sodium chloride flush (NS) 0.9 % injection 3 mL (not administered)  sodium chloride flush (NS) 0.9 % injection 3 mL (not administered)  0.9 %  sodium chloride infusion (not administered)  levofloxacin (LEVAQUIN) IVPB 750 mg (750 mg Intravenous Given 07/26/16 1740)  methylPREDNISolone sodium succinate (SOLU-MEDROL) 125 mg/2 mL injection 60 mg (60 mg Intravenous Given 07/26/16 1741)  levalbuterol (XOPENEX) nebulizer solution 0.63 mg (0.63 mg Nebulization Given 07/26/16 1635)  ipratropium (ATROVENT) nebulizer solution 0.5 mg (0.5 mg Nebulization Given 07/26/16 1635)  dextromethorphan-guaiFENesin (MUCINEX DM) 30-600 MG per 12 hr tablet 2 tablet (2 tablets Oral Given 07/26/16 1741)  albuterol (PROVENTIL,VENTOLIN) solution continuous neb (10 mg/hr Nebulization Given 07/26/16 1240)  ipratropium (ATROVENT) nebulizer solution 0.5 mg (0.5 mg Nebulization Given 07/26/16 1240)  methylPREDNISolone sodium succinate (SOLU-MEDROL) 125 mg/2 mL injection 125 mg (125 mg Intravenous Given 07/26/16 1231)     Initial Impression / Assessment and Plan / ED Course  I have reviewed the triage vital signs and the nursing notes.  Pertinent labs & imaging results that were available during my care of the patient were reviewed by me and considered in my medical decision making (see chart for details).  Clinical Course as of Jul 27 1839  Sat Jul 26, 2016  1346 Continued diffuse wheezing following hour long neb treatment.  Pt very shaky from albuterol.  She agrees with admission.  Admitted to [facility], Dr Ree Kida.    [EW]    Clinical Course User Index [EW] Clayton Bibles, PA-C   Afebrile nontoxic patient with hypoxia and wheezing from COPD exacerbation.  CXR negative. Labs reassuring.  Nebs, solu-medrol given without improvement.  Admitted to [facility].     Final Clinical Impressions(s) / ED Diagnoses   Final diagnoses:  COPD exacerbation [facility])    New Prescriptions Current Discharge Medication List       Clayton Bibles, [place] 07/26/16 [place], MD 07/29/16 1911

## 2016-07-27 ENCOUNTER — Inpatient Hospital Stay (HOSPITAL_COMMUNITY): Payer: Medicare Other

## 2016-07-27 DIAGNOSIS — E440 Moderate protein-calorie malnutrition: Secondary | ICD-10-CM

## 2016-07-27 DIAGNOSIS — F411 Generalized anxiety disorder: Secondary | ICD-10-CM

## 2016-07-27 DIAGNOSIS — F329 Major depressive disorder, single episode, unspecified: Secondary | ICD-10-CM

## 2016-07-27 DIAGNOSIS — R0600 Dyspnea, unspecified: Secondary | ICD-10-CM

## 2016-07-27 DIAGNOSIS — J441 Chronic obstructive pulmonary disease with (acute) exacerbation: Principal | ICD-10-CM

## 2016-07-27 DIAGNOSIS — R413 Other amnesia: Secondary | ICD-10-CM

## 2016-07-27 DIAGNOSIS — J9601 Acute respiratory failure with hypoxia: Secondary | ICD-10-CM

## 2016-07-27 LAB — BASIC METABOLIC PANEL
ANION GAP: 8 (ref 5–15)
BUN: 21 mg/dL — ABNORMAL HIGH (ref 6–20)
CHLORIDE: 100 mmol/L — AB (ref 101–111)
CO2: 30 mmol/L (ref 22–32)
CREATININE: 0.31 mg/dL — AB (ref 0.44–1.00)
Calcium: 8.2 mg/dL — ABNORMAL LOW (ref 8.9–10.3)
GFR calc non Af Amer: >60 mL/min (ref >60)
Glucose, Bld: 131 mg/dL — ABNORMAL HIGH (ref 65–99)
POTASSIUM: 4 mmol/L (ref 3.5–5.1)
SODIUM: 138 mmol/L (ref 135–145)

## 2016-07-27 LAB — CBC
HEMATOCRIT: 43.8 % (ref 36.0–46.0)
HEMOGLOBIN: 14.7 g/dL (ref 12.0–15.0)
MCH: 33.3 pg (ref 26.0–34.0)
MCHC: 33.6 g/dL (ref 30.0–36.0)
MCV: 99.1 fL (ref 78.0–100.0)
PLATELETS: 209 10*3/uL (ref 150–400)
RBC: 4.42 MIL/uL (ref 3.87–5.11)
RDW: 13.8 % (ref 11.5–15.5)
WBC: 12.9 10*3/uL — AB (ref 4.0–10.5)

## 2016-07-27 LAB — TROPONIN I: Troponin I: <0.03 ng/mL (ref <0.03)

## 2016-07-27 LAB — ECHOCARDIOGRAM COMPLETE
Height: 61 in
Weight: 1710.77 oz

## 2016-07-27 MED ORDER — LIDOCAINE HCL 1 % IJ SOLN
INTRAMUSCULAR | Status: AC
Start: 2016-07-27 — End: 2016-07-27
  Filled 2016-07-27: qty 60

## 2016-07-27 MED ORDER — LEVOFLOXACIN IN D5W 500 MG/100ML IV SOLN
500.0000 mg | INTRAVENOUS | Status: DC
Start: 2016-07-27 — End: 2016-07-28
  Administered 2016-07-27: 500 mg via INTRAVENOUS
  Filled 2016-07-27 (×2): qty 100

## 2016-07-27 MED ORDER — BUDESONIDE 0.5 MG/2ML IN SUSP
0.5000 mg | Freq: Two times a day (BID) | RESPIRATORY_TRACT | Status: DC
  Administered 2016-07-27 – 2016-07-28 (×3): 0.5 mg via RESPIRATORY_TRACT
  Filled 2016-07-27 (×3): qty 2

## 2016-07-27 MED ORDER — ENSURE ENLIVE PO LIQD
237.0000 mL | Freq: Two times a day (BID) | ORAL | Status: DC
  Administered 2016-07-27: 237 mL via ORAL

## 2016-07-27 NOTE — Progress Notes (Signed)
  Echocardiogram 2D Echocardiogram has been performed.  [name] 07/27/2016, 8:47 AM

## 2016-07-27 NOTE — Progress Notes (Signed)
[facility] PROGRESS NOTE  Michele Martin BJS:[id number] DOB: 06/22/1946 DOA: 07/26/2016 PCP: Cathlean Cower, MD  Interim summary and HPI 70 y.o. female with a medical history of COPD, chronic bronchitis, anxiety/depression, presented to the emergency department with complaints of shortness of breath and palpitations. Patient states she does have home oxygen however does not use it. She was using this at night for her sleep apnea. Patient states that over the last several days to weeks, she has been having more shortness of breath with cough. Cough has been productive. She has been seeing her primary care physician and has tried antibiotics as well as prednisone. Patient was trying to avoid admission. She does endorse getting her flu and pneumonia vaccinations. Patient was found to have oxygen saturations of 86% while her primary care physician's office, 56% while ambulating. She denies any recent ill contacts, immobilization, travel, denies chest pain, nausea, vomiting, dizziness, abdominal pain, constipation, diarrhea. Patient states she did use her nebulizer machine twice daily with little benefit.  Assessment/Plan: Acute hypoxic respiratory failure/COPD exacerbation/bronchiectasis -Patient presented to her primary care physician's office, noted to have 86% saturation on room air, and even lower upon ambulation. -Patient did have recent emergency department visit 2 days ago and left AGAINST MEDICAL ADVICE at that time. Per family, no the best complaint with medication regimen and instructions. -Chest x-ray: COPD with bronchitic changes, no acute infiltrates.  -Continue supplemental oxygen to maintain saturations above 90-92% -Will continue solu-medrol, levaquin, nebs, antitussives and start pulmicort -assess oxygen requirements on exertion  Palpitations  -Possibly secondary to COPD exacerbation and increase albuterol use  -Echocardiogram with normal structures and preserved  EF -Troponin negative, mild sinus tachycardia on telemetry  -TSH borderline low; will check free T4; could be associated to sick thyroid from acute infection  Anxiety/depression -Continue Klonopin and Lexapro  Memory dysfunction/Dementia -Continue Aricept -stable overall  -continue supportive care   Oral thrush -Recently started on nystatin, will continue -patient educated on good oral hygiene and mouth rinse after use of home inhalers/nebulizer therapies.  Hyperlipidemia -Continue statin  Tobacco abuse -Discussed smoking cessation -Nicotine patch offered, patient declined  Sleep apnea -Patient has been using oxygen nightly, decline CPAP machine before.  -will benefit of repeat sleep study as an outpatient   Moderate protein calorie malnutrition  -started on feeding supplements -nutritional service consulted  Code Status: Full Family Communication:sister and nieces at bedside  Disposition Plan: continue steroids, weaned oxygen down to RA as tolerated. Will start pulmicort and continue flutter valve. Will continue abx's and assess oxygen needs with exertion. Home when breathing better. 1-2 days    Consultants:  None   Procedures:  2-D echo - Left ventricle: The cavity size was normal. Wall thickness was   increased in a pattern of mild LVH. Systolic function was normal.   The estimated ejection fraction was in the range of 60% to 65%.   Doppler parameters are consistent with abnormal left ventricular   relaxation (grade 1 diastolic dysfunction). - Left atrium: The atrium was moderately dilated.  Antibiotics:    HPI/Subjective: Afebrile, still SOB and with diffuse wheezing. Overall feeling better and denying CP.  Objective: Vitals:   07/27/16 1500 07/27/16 1609  BP: 136/73   Pulse: (!) 117 89  Resp: 18 18  Temp: 99 F (37.2 C)    No intake or output data in the 24 hours ending 07/27/16 1823 Filed Weights   07/26/16 1528  Weight: 48.5 kg (106 lb  14.8 oz)  Exam:   General:  Afebrile, reports feeling better, no nausea, no vomiting. Patient endorses palpitation, but denies CP. Patient still with difficulty speaking in full sentences and SOB with exertion.  Cardiovascular: mild tachycardia, no rubs, no gallops, no JVD  Respiratory: diffuse rhonchi and exp wheezing. No using accessory muscles  Abdomen: soft, NT, ND, positive BS  Musculoskeletal: no edema, no cyanosis   Data Reviewed: Basic Metabolic Panel:  Recent Labs Lab 07/24/16 1418 07/26/16 1225 07/26/16 1616 07/27/16 0530  NA 140 140  --  138  K 4.3 4.1  --  4.0  CL 101 98*  --  100*  CO2 31 32  --  30  GLUCOSE 97 89  --  131*  BUN 14 15  --  21*  CREATININE 0.62 0.43* 0.66 0.31*  CALCIUM 9.7 9.3  --  8.2*  MG  --   --  1.9  --    CBC:  Recent Labs Lab 07/24/16 1418 07/26/16 1225 07/26/16 1616 07/27/16 0530  WBC 8.1 9.7 9.1 12.9*  NEUTROABS  --  8.5*  --   --   HGB 15.7* 15.9* 15.4* 14.7  HCT 48.1* 48.8* 46.7* 43.8  MCV 99.8 99.4 98.9 99.1  PLT 219 222 215 209   Cardiac Enzymes:  Recent Labs Lab 07/26/16 1616 07/26/16 2241 07/27/16 0530  TROPONINI <0.03 <0.03 <0.03   BNP (last 3 results)  Recent Labs  07/24/16 1933 07/26/16 1225  BNP 40.6 44.2    Studies: Dg Chest 2 View  Result Date: 07/26/2016 CLINICAL DATA:  Pt c/o continuing chest pain. Pt sent from her PCP due to COPD exacerbation. Pt was seen at [facility] 2 days ago and left AMA. Pt still SOB and reports heart is pounding. Pt had home albuterol treatment this morning. EXAM: CHEST  2 VIEW COMPARISON:  07/24/2016 FINDINGS: Lungs are hyperinflated. There is perihilar peribronchial thickening. There are no focal consolidations or pleural effusions. No pulmonary edema. Status post cervical fusion. IMPRESSION: 1. Hyperinflation and bronchitic changes. 2.  No focal acute pulmonary abnormality. Electronically Signed   By: Nolon Nations M.D.   On: 07/26/2016 12:09    Scheduled Meds: .  aspirin EC  81 mg Oral Daily  . atorvastatin  20 mg Oral Daily  . budesonide (PULMICORT) nebulizer solution  0.5 mg Nebulization BID  . dextromethorphan-guaiFENesin  2 tablet Oral BID  . donepezil  10 mg Oral QHS  . enoxaparin (LOVENOX) injection  40 mg Subcutaneous Q24H  . escitalopram  20 mg Oral Daily  . feeding supplement (ENSURE ENLIVE)  237 mL Oral BID BM  . ipratropium  0.5 mg Nebulization QID  . levalbuterol  0.63 mg Nebulization QID  . levofloxacin (LEVAQUIN) IV  500 mg Intravenous Q24H  . loratadine  10 mg Oral Daily  . montelukast  10 mg Oral QHS  . nystatin  5 mL Oral QID  . sodium chloride flush  3 mL Intravenous Q12H  . sodium chloride flush  3 mL Intravenous Q12H  . tiotropium  18 mcg Inhalation Daily   Continuous Infusions:  Active Problems:   Anxiety state   Depression   Peripheral edema   COPD exacerbation ([place])   Memory dysfunction   Hyperlipidemia   Acute respiratory failure with hypoxia ([place])   Time spent: 25 minutes   Barton Dubois  [facility] Pager [phone]. If 7PM-7AM, please contact night-coverage at [email or web address], password [facility] 07/27/2016, 6:23 PM  LOS: 1 day

## 2016-07-27 NOTE — Progress Notes (Signed)
PHARMACY NOTE:  ANTIMICROBIAL RENAL DOSAGE ADJUSTMENT  Current antimicrobial regimen includes a mismatch between antimicrobial dosage and estimated renal function.  As per policy approved by the Pharmacy & Therapeutics and Medical Executive Committees, the antimicrobial dosage will be adjusted accordingly.  Current antimicrobial dosage:  Levaquin 750 mg IV q24h  Indication: COPD exacerbation  Renal Function:  Estimated Creatinine Clearance: 48.7 mL/min (by C-G formula based on SCr of 0.31 mg/dL (L)). '[]'$      On intermittent HD, scheduled: '[]'$      On CRRT    Antimicrobial dosage has been changed to:  Levaquin 500 mg IV q24h   Thank you for allowing pharmacy to be a part of this patient's care.  Clayburn Pert, PharmD, BCPS Pager: [phone] 07/27/2016  6:39 AM

## 2016-07-27 NOTE — Progress Notes (Signed)
  Echocardiogram 2D Echocardiogram has been performed.  Michele Martin 07/27/2016, 9:58 AM

## 2016-07-27 NOTE — Evaluation (Signed)
Physical Therapy Evaluation Patient Details Name: Michele Martin MRN: [id number] DOB: 02-25-1946 Today's Date: 07/27/2016   History of Present Illness  Michele Martin is a 70 y.o. female with a medical history of COPD, chronic bronchitis, anxiety/depression, presented to the emergency department with complaints of shortness of breath, hypoxiaa in MD office on 12/30.17 Patient states she does have home oxygen however does not use it. Found to have COPD exacebation.  Clinical Impression  The patient is mobilizing well. Relies on objects if  Not using RW. Recommend that she have family stay with her. Recommend HHPT. Pt admitted with above diagnosis. Pt currently with functional limitations due to the deficits listed below (see PT Problem List).  Pt will benefit from skilled PT to increase their independence and safety with mobility to allow discharge to the venue listed below.   Ambulated x 100'. sats 91 % on RA.  HR 115 max.      Follow Up Recommendations Home health PT;Supervision/Assistance - 24 hour    Equipment Recommendations  None recommended by PT    Recommendations for Other Services       Precautions / Restrictions Precautions Precautions: Fall Precaution Comments: MONITOR SATS      Mobility  Bed Mobility               General bed mobility comments: in recliner  Transfers Overall transfer level: Modified independent Equipment used: None             General transfer comment:  Ambulation/Gait Ambulation/Gait assistance: Supervision;Min guard Ambulation Distance (Feet): 100 Feet Assistive device: Rolling walker (2 wheeled) Gait Pattern/deviations: Step-through pattern;Drifts right/left     General Gait Details: decreased speed, steady assist if not using RW, holds to things without RW  Stairs            Wheelchair Mobility    Modified Rankin (Stroke Patients Only)       Balance Overall balance assessment: Needs assistance          Standing balance support: During functional activity;No upper extremity supported Standing balance-Leahy Scale: Fair                               Pertinent Vitals/Pain Pain Assessment: Faces Faces Pain Scale: Hurts even more Pain Location: back Pain Descriptors / Indicators: Cramping;Discomfort Pain Intervention(s): Monitored during session    Home Living Family/patient expects to be discharged to:: Private residence Living Arrangements: Alone Available Help at Discharge: Family;Available PRN/intermittently Type of Home: House Home Access: Stairs to enter Entrance Stairs-Rails: Can reach both Entrance Stairs-Number of Steps: 3 + 3 Home Layout: One level Home Equipment: Walker - 4 wheels Additional Comments: Pt. states she did not use walker.     Prior Function Level of Independence: Independent         Comments: drives     Hand Dominance        Extremity/Trunk Assessment   Upper Extremity Assessment Upper Extremity Assessment: Overall WFL for tasks assessed    Lower Extremity Assessment Lower Extremity Assessment: Overall WFL for tasks assessed    Cervical / Trunk Assessment Cervical / Trunk Assessment: Kyphotic  Communication   Communication: No difficulties  Cognition Arousal/Alertness: Awake/alert Behavior During Therapy: WFL for tasks assessed/performed Overall Cognitive Status: Within Functional Limits for tasks assessed                      General Comments  Exercises     Assessment/Plan    PT Assessment Patient needs continued PT services  PT Problem List Decreased activity tolerance;Decreased balance;Decreased mobility;Decreased knowledge of precautions;Decreased safety awareness          PT Treatment Interventions DME instruction;Gait training;Stair training;Functional mobility training;Therapeutic activities;Patient/family education    PT Goals (Current goals can be found in the Care Plan section)  Acute  Rehab PT Goals Patient Stated Goal: to go home PT Goal Formulation: With patient Time For Goal Achievement: 08/03/16 Potential to Achieve Goals: Good    Frequency Min 3X/week   Barriers to discharge        Co-evaluation               End of Session Equipment Utilized During Treatment: Oxygen Activity Tolerance: Patient tolerated treatment well Patient left: in chair;with call bell/phone within reach;with chair alarm set Nurse Communication: Mobility status         Time: 9892-1194 PT Time Calculation (min) (ACUTE ONLY): 12 min   Charges:   PT Evaluation $PT Eval Low Complexity: 1 Procedure     PT G CodesClaretha Martin 07/27/2016, 1:38 PM Michele Martin PT [phone]

## 2016-07-27 NOTE — Evaluation (Signed)
Occupational Therapy Evaluation Patient Details Name: Michele Martin MRN: [id number] DOB: 07/29/1945 Today's Date: 07/27/2016    History of Present Illness M Michele Martin is a 70 y.o. female with a medical history of COPD, chronic bronchitis, anxiety/depression, presented to the emergency department with complaints of shortness of breath, hypoxiaa in MD office on 12/30.17 Patient states she does have home oxygen however does not use it. Found to have COPD exacebation.   Clinical Impression   Pt. Is at Mod I with ADLs and mobility. Pt. States she has decreased balance and has walker but does not like to use it. No further OT needs at this time.     Follow Up Recommendations  No OT follow up    Equipment Recommendations  None recommended by OT    Recommendations for Other Services       Precautions / Restrictions Precautions Precautions: Fall Restrictions Weight Bearing Restrictions: No      Mobility Bed Mobility                  Transfers                 General transfer comment: Mod I with bed to chair transfers.     Balance                                            ADL Overall ADL's : At baseline                                       General ADL Comments: Pt. is Mod I with ADLs and with transfer bed to chair. Pt. states she has bad balance but does not want to use walekr.      Vision     Perception     Praxis      Pertinent Vitals/Pain Pain Assessment: No/denies pain     Hand Dominance Right   Extremity/Trunk Assessment Upper Extremity Assessment Upper Extremity Assessment: Overall WFL for tasks assessed           Communication Communication Communication: No difficulties   Cognition Arousal/Alertness: Awake/alert Behavior During Therapy: WFL for tasks assessed/performed Overall Cognitive Status: Within Functional Limits for tasks assessed                     General  Comments       Exercises       Shoulder Instructions      Home Living Family/patient expects to be discharged to:: Private residence Living Arrangements: Alone Available Help at Discharge: Family;Available PRN/intermittently Type of Home: House Home Access: Stairs to enter [employer] of Steps:  (6) Entrance Stairs-Rails: Right;Left;Can reach both Home Layout: One level     Bathroom Shower/Tub: Tub/shower unit Shower/tub characteristics: Door Biochemist, clinical: Standard     Home Equipment: Environmental consultant - 2 wheels;Shower seat;Grab bars - tub/shower   Additional Comments: Pt. states shedid not use walker.       Prior Functioning/Environment Level of Independence: Independent                 OT Problem List:     OT Treatment/Interventions:      OT Goals(Current goals can be found in the care plan section) Acute Rehab OT Goals Patient Stated Goal:  (  To go home.) OT Goal Formulation: With patient  OT Frequency:     Barriers to D/C:            Co-evaluation              End of Session Equipment Utilized During Treatment: Oxygen  Activity Tolerance: Patient tolerated treatment well Patient left: in chair;with call bell/phone within reach;with chair alarm set   Time: 2202-5427 OT Time Calculation (min): 30 min Charges:  OT General Charges $OT Visit: 1 Procedure OT Evaluation $OT Eval Low Complexity: 1 Procedure OT Treatments $Self Care/Home Management : 8-22 mins G-Codes:    Michele Martin July 31, 2016, 9:45 AM

## 2016-07-27 NOTE — Progress Notes (Signed)
RT gave pt flutter valve. Pt knows and understands how to use.  

## 2016-07-28 DIAGNOSIS — Z720 Tobacco use: Secondary | ICD-10-CM

## 2016-07-28 DIAGNOSIS — E785 Hyperlipidemia, unspecified: Secondary | ICD-10-CM

## 2016-07-28 LAB — CBC
HEMATOCRIT: 41.8 % (ref 36.0–46.0)
Hemoglobin: 13.7 g/dL (ref 12.0–15.0)
MCH: 32.5 pg (ref 26.0–34.0)
MCHC: 32.8 g/dL (ref 30.0–36.0)
MCV: 99.1 fL (ref 78.0–100.0)
PLATELETS: 216 10*3/uL (ref 150–400)
RBC: 4.22 MIL/uL (ref 3.87–5.11)
RDW: 13.7 % (ref 11.5–15.5)
WBC: 14.5 10*3/uL — ABNORMAL HIGH (ref 4.0–10.5)

## 2016-07-28 LAB — BASIC METABOLIC PANEL
ANION GAP: 4 — AB (ref 5–15)
BUN: 31 mg/dL — AB (ref 6–20)
CHLORIDE: 101 mmol/L (ref 101–111)
CO2: 34 mmol/L — AB (ref 22–32)
Calcium: 8.2 mg/dL — ABNORMAL LOW (ref 8.9–10.3)
Creatinine, Ser: 0.41 mg/dL — ABNORMAL LOW (ref 0.44–1.00)
GFR calc Af Amer: >60 mL/min (ref >60)
GLUCOSE: 87 mg/dL (ref 65–99)
Potassium: 4 mmol/L (ref 3.5–5.1)
Sodium: 139 mmol/L (ref 135–145)

## 2016-07-28 LAB — T4, FREE: Free T4: 0.78 ng/dL (ref 0.61–1.12)

## 2016-07-28 MED ORDER — CLONAZEPAM 0.5 MG PO TABS: 0.5000 mg | ORAL_TABLET | Freq: Three times a day (TID) | ORAL | Status: DC | PRN

## 2016-07-28 MED ORDER — LEVOFLOXACIN 500 MG PO TABS: 500.0000 mg | ORAL_TABLET | Freq: Every day | ORAL | 0 refills | Status: DC

## 2016-07-28 MED ORDER — PREDNISONE 20 MG PO TABS: ORAL_TABLET | ORAL | 0 refills | Status: DC

## 2016-07-28 MED ORDER — ENSURE ENLIVE PO LIQD: 237.0000 mL | Freq: Two times a day (BID) | ORAL | Status: DC

## 2016-07-28 MED ORDER — NYSTATIN 100000 UNIT/ML MT SUSP: 5.0000 mL | Freq: Four times a day (QID) | OROMUCOSAL | 0 refills | Status: DC

## 2016-07-28 MED ORDER — DM-GUAIFENESIN ER 30-600 MG PO TB12: 2.0000 | ORAL_TABLET | Freq: Two times a day (BID) | ORAL | 0 refills | Status: DC

## 2016-07-28 MED ORDER — LORATADINE 10 MG PO TABS: 10.0000 mg | ORAL_TABLET | Freq: Every day | ORAL | 1 refills | Status: DC

## 2016-07-28 MED ORDER — IPRATROPIUM BROMIDE 0.02 % IN SOLN: 0.5000 mg | Freq: Four times a day (QID) | RESPIRATORY_TRACT | Status: DC | PRN

## 2016-07-28 NOTE — Progress Notes (Signed)
Patient discharged to home with family, discharge instructions reviewed with patient who verbalized understanding. New RX's given to patient. 

## 2016-07-28 NOTE — Discharge Summary (Signed)
Physician Discharge Summary  Michele Martin:[id number] DOB: October 30, 1945 DOA: 07/26/2016  PCP: Cathlean Cower, MD  Admit date: 07/26/2016 Discharge date: 07/28/2016  Time spent: 35 minutes  Recommendations for Outpatient Follow-up:  1. Reassess BMET to follow electrolytes  2. Repeat thyroid function in 4 weeks to reassess levels  3. Arrange outpatient sleep study and assure patient follow up with pulmonary service.   Discharge Diagnoses:  Active Problems:   Anxiety state   Depression   Peripheral edema   COPD exacerbation (HCC)   Memory dysfunction   Hyperlipidemia   Acute respiratory failure with hypoxia ([place])   Discharge Condition: stable and improved. Discharge home with instructions to follow up with PCP in 10 days and with pulmonary service in 2 weeks.  Diet recommendation: heart healthy diet   Filed Weights   07/26/16 1528  Weight: 48.5 kg (106 lb 14.8 oz)    History of present illness:  71 y.o.femalewith a medical history of COPD, chronic bronchitis, anxiety/depression, presented to the emergency department with complaints of shortness of breath and palpitations. Patient states she does have home oxygen however does not use it. She was using this at night for her sleep apnea. Patient states that over the last several days to weeks, she has been having more shortness of breath with cough. Cough has been productive. She has been seeing her primary care physician and has tried antibiotics as well as prednisone. Patient was trying to avoid admission. She does endorse getting her flu and pneumonia vaccinations. Patient was found to have oxygen saturations of 86% while her primary care physician's office, 56% while ambulating. She denies any recent ill contacts, immobilization, travel, denies chest pain, nausea, vomiting, dizziness, abdominal pain, constipation, diarrhea.  Hospital Course:  Acute hypoxic respiratory failure/COPD exacerbation/bronchiectasis -Patient  presented to her primary care physician's office, noted to have 86% saturation on room air, and even lower upon ambulation. -Patient did have recent emergency department visit 2 days prior to admission and left AGAINST MEDICAL ADVICE at that time. Per family, no the best complaint with medication regimen and instructions from physicians. -Chest x-ray: COPD with bronchitic changes, no acute infiltrates.  -responded well to treatment for COPD exacerbation and after 48 hours she demonstrated rapidly improvement. Will discharge home with steroids tapering, PRN nebulization, resumption of spiriva and symbicort. Will also continue levaquin for bronchiectasis. -no oxygen requirements with rest or exertion.  Palpitations  -Possibly secondary to COPD exacerbation and increase albuterol use  -Echocardiogram with normal structures and preserved EF -Troponin negative, mild sinus tachycardia on telemetry and after adjustment on nebulizer and IVF's symptoms HR normalized. -TSH borderline low; and normal free T4; could be associated to sick thyroid from acute infection. Recommend thyroid function test to be reevaluated in 4-6 weeks  Anxiety/depression -Continue Klonopin and Lexapro; first one changed to TID PRN for better symptoms control.  Memory dysfunction/Dementia -Continue Aricept -stable overall   Oral thrush -Recently started on nystatin, will continue -patient educated on good oral hygiene and mouth rinse after use of home inhalers/nebulizer therapies.  Hyperlipidemia -Continue statin  Tobacco abuse -Discussed smoking cessation -Nicotine patch offered, patient declined -she expressed that part of her 2018 resolution is to stop smoking for good.  Sleep apnea -Patient has been using oxygen nightly, decline CPAP machine before.  -will benefit of repeat sleep study as an outpatient   Moderate protein calorie malnutrition  -started on feeding supplements -patient encourage to follow  good hydration and nutrition   Grade 1 diastolic HF:  chronic and compensated  -advise to watch sodium intake and to follow daily weights   Procedures:  2-D echo - Left ventricle: The cavity size was normal. Wall thickness was increased in a pattern of mild LVH. Systolic function was normal. The estimated ejection fraction was in the range of 60% to 65%. Doppler parameters are consistent with abnormal left ventricular relaxation (grade 1 diastolic dysfunction). - Left atrium: The atrium was moderately dilated.  Consultations:  None   Discharge Exam: Vitals:   07/28/16 0815 07/28/16 1206  BP:    Pulse: 80 83  Resp: 18 18  Temp:       General:  Afebrile, reports feeling much better, no nausea, no vomiting. Patient endorses no further palpitation and denies CP. Patient no requiring oxygen supplementation at rest or exertion and able to speak in full sentences.   Cardiovascular: RRR, no rubs, no gallops, no JVD  Respiratory: scattered and very mild exp wheezing. Good air movement bilaterally and no using accessory muscles   Abdomen: soft, NT, ND, positive BS  Musculoskeletal: no edema, no cyanosis   Discharge Instructions   Discharge Instructions    Diet - low sodium heart healthy    Complete by:  As directed    Discharge instructions    Complete by:  As directed    Stop smoking Keep yourself adequately hydrated and follow good nutrition intake (use ensure as part of feeding supplements) Arrange follow up with PCP in 10 days Arrange follow up with pulmonologist in 2-3 weeks  Take medications as prescribed and use oxygen at bedtime  Outpatient sleep study to be arranged by your PCP or pulmonologist     Current Discharge Medication List    START taking these medications   Details  dextromethorphan-guaiFENesin (MUCINEX DM) 30-600 MG 12hr tablet Take 2 tablets by mouth 2 (two) times daily. Qty: 60 tablet, Refills: 0    feeding supplement, ENSURE ENLIVE,  (ENSURE ENLIVE) LIQD Take 237 mLs by mouth 2 (two) times daily between meals.    loratadine (CLARITIN) 10 MG tablet Take 1 tablet (10 mg total) by mouth daily. Qty: 30 tablet, Refills: 1      CONTINUE these medications which have CHANGED   Details  clonazePAM (KLONOPIN) 0.5 MG tablet Take 1 tablet (0.5 mg total) by mouth 3 (three) times daily as needed for anxiety.    levofloxacin (LEVAQUIN) 500 MG tablet Take 1 tablet (500 mg total) by mouth daily. Qty: 8 tablet, Refills: 0    nystatin (MYCOSTATIN) 100000 UNIT/ML suspension Take 5 mLs (500,000 Units total) by mouth 4 (four) times daily. Swish in mouth and spit out Qty: 150 mL, Refills: 0    predniSONE (DELTASONE) 20 MG tablet Take 3 tablets by mouth daily X 3 days; then 2 tablets by mouth daily X 3 days; then 1 tablet by mouth daily X 3 days; then start using '10mg'$  (1/2) by mouth daily. Qty: 30 tablet, Refills: 0      CONTINUE these medications which have NOT CHANGED   Details  albuterol (PROVENTIL HFA) 108 (90 BASE) MCG/ACT inhaler Inhale 2 puffs into the lungs every 6 (six) hours as needed for wheezing or shortness of breath. Qty: 18 g, Refills: 5    albuterol (PROVENTIL) (2.5 MG/3ML) 0.083% nebulizer solution Take 3 mLs (2.5 mg total) by nebulization every 6 (six) hours as needed for wheezing or shortness of breath. Qty: 150 mL, Refills: 5    aspirin 81 MG EC tablet Take 81 mg by mouth daily.  atorvastatin (LIPITOR) 20 MG tablet TAKE 1 TABLET BY MOUTH ONCE DAILY Qty: 90 tablet, Refills: 3    budesonide-formoterol (SYMBICORT) 160-4.5 MCG/ACT inhaler Inhale 2 puffs into the lungs 2 (two) times daily. Qty: 3 Inhaler, Refills: 3    donepezil (ARICEPT) 10 MG tablet Take 1 tablet daily Qty: 90 tablet, Refills: 3   Associated Diagnoses: Mild cognitive impairment    escitalopram (LEXAPRO) 20 MG tablet TAKE 1 TABLET BY MOUTH ONCE DAILY Qty: 90 tablet, Refills: 0    hydrOXYzine (ATARAX/VISTARIL) 10 MG tablet Take 10 mg by  mouth 3 (three) times daily as needed for anxiety.    montelukast (SINGULAIR) 10 MG tablet TAKE 1 TABLET BY MOUTH EVERY NIGHT AT BEDTIME Qty: 30 tablet, Refills: 11    tiotropium (SPIRIVA HANDIHALER) 18 MCG inhalation capsule Place 1 capsule (18 mcg total) into inhaler and inhale daily. Qty: 90 capsule, Refills: 3      STOP taking these medications     guaiFENesin (MUCINEX) 600 MG 12 hr tablet      HYDROcodone-homatropine (HYCODAN) 5-1.5 MG/5ML syrup      fexofenadine (ALLEGRA) 180 MG tablet        Allergies  Allergen Reactions  . Pravastatin Itching and Other (See Comments)    Myalgias/cramping  . Simvastatin Itching and Other (See Comments)    Myalgias/cramping  . Statins Itching and Other (See Comments)    Myalgias/cramping   Follow-up Information    Cathlean Cower, MD. Schedule an appointment as soon as possible for a visit in 10 day(s).   Specialties:  Internal Medicine, Radiology Contact information: [address] [place] [address] [phone]           The results of significant diagnostics from this hospitalization (including imaging, microbiology, ancillary and laboratory) are listed below for reference.    Significant Diagnostic Studies: Dg Chest 2 View  Result Date: 07/26/2016 CLINICAL DATA:  Pt c/o continuing chest pain. Pt sent from her PCP due to COPD exacerbation. Pt was seen at [facility] 2 days ago and left AMA. Pt still SOB and reports heart is pounding. Pt had home albuterol treatment this morning. EXAM: CHEST  2 VIEW COMPARISON:  07/24/2016 FINDINGS: Lungs are hyperinflated. There is perihilar peribronchial thickening. There are no focal consolidations or pleural effusions. No pulmonary edema. Status post cervical fusion. IMPRESSION: 1. Hyperinflation and bronchitic changes. 2.  No focal acute pulmonary abnormality. Electronically Signed   By: Nolon Nations M.D.   On: 07/26/2016 12:09   Dg Chest 2 View  Result Date: 07/24/2016 CLINICAL  DATA:  Shortness of breath and cardiac arrhythmia EXAM: CHEST  2 VIEW COMPARISON:  July 16, 2016 FINDINGS: There is scarring in the medial left upper lobe region. There is no edema or consolidation. Heart size and pulmonary vascularity are normal. There is atherosclerotic calcification in the aorta. No adenopathy. There is thoracolumbar levoscoliosis. There is postoperative change in the lower cervical spine. IMPRESSION: Scarring left upper lobe. No edema or consolidation. Aortic atherosclerosis. Electronically Signed   By: Lowella Grip III M.D.   On: 07/24/2016 14:57   Dg Chest 2 View  Result Date: 07/16/2016 CLINICAL DATA:  Dyspnea EXAM: CHEST  2 VIEW COMPARISON:  10/03/2015 FINDINGS: Cardiac shadow is within normal limits. Aortic calcifications are seen. Lungs are well aerated bilaterally. No focal infiltrate or sizable effusion is seen. No acute bony abnormality is noted. Stable postsurgical changes in the cervical spine are seen. IMPRESSION: No active cardiopulmonary disease. Electronically Signed  By: Inez Catalina M.D.   On: 07/16/2016 10:26   Labs: Basic Metabolic Panel:  Recent Labs Lab 07/24/16 1418 07/26/16 1225 07/26/16 1616 07/27/16 0530 07/28/16 0529  NA 140 140  --  138 139  K 4.3 4.1  --  4.0 4.0  CL 101 98*  --  100* 101  CO2 31 32  --  30 34*  GLUCOSE 97 89  --  131* 87  BUN 14 15  --  21* 31*  CREATININE 0.62 0.43* 0.66 0.31* 0.41*  CALCIUM 9.7 9.3  --  8.2* 8.2*  MG  --   --  1.9  --   --    CBC:  Recent Labs Lab 07/24/16 1418 07/26/16 1225 07/26/16 1616 07/27/16 0530 07/28/16 0529  WBC 8.1 9.7 9.1 12.9* 14.5*  NEUTROABS  --  8.5*  --   --   --   HGB 15.7* 15.9* 15.4* 14.7 13.7  HCT 48.1* 48.8* 46.7* 43.8 41.8  MCV 99.8 99.4 98.9 99.1 99.1  PLT 219 222 215 209 216   Cardiac Enzymes:  Recent Labs Lab 07/26/16 1616 07/26/16 2241 07/27/16 0530  TROPONINI <0.03 <0.03 <0.03   BNP: BNP (last 3 results)  Recent Labs  07/24/16 1933  07/26/16 1225  BNP 40.6 44.2    Signed:  Barton Dubois MD.  [facility] 07/28/2016, 2:16 PM

## 2016-07-29 ENCOUNTER — Ambulatory Visit: Payer: Medicare Other | Admitting: Internal Medicine

## 2016-07-29 ENCOUNTER — Telehealth: Payer: Self-pay | Admitting: *Deleted

## 2016-07-29 MED ORDER — CEFAZOLIN SODIUM-DEXTROSE 2-4 GM/100ML-% IV SOLN
INTRAVENOUS | Status: AC
  Filled 2016-07-29: qty 100

## 2016-07-29 NOTE — Telephone Encounter (Signed)
Transition Care Management Follow-up Telephone Call   Date discharged? 07/28/2016   How have you been since you were released from the hospital? Pt states she is doing alright   Do you understand why you were in the hospital? YES   Do you understand the discharge instructions? YES   Where were you discharged to? Home   Items Reviewed:  Medications reviewed: YES  Allergies reviewed: YES  Dietary changes reviewed: YES  Referrals reviewed: YES, have not made appt w/pulmonary yet   Functional Questionnaire:   Activities of Daily Living (ADLs):   She states she are independent in the following: ambulation, bathing and hygiene, feeding, continence, grooming, toileting and dressing States she require assistance with the following: ambulation sometimes   Any transportation issues/concerns?: NO   Any patient concerns? NO   Confirmed importance and date/time of follow-up visits scheduled : YES, made for 08/06/16  Provider Appointment booked with Dr. Jenny Reichmann  Confirmed with patient if condition begins to worsen call PCP or go to the ER.  Patient was given the office number and encouraged to call back with question or concerns.  : YES

## 2016-08-04 ENCOUNTER — Emergency Department (HOSPITAL_COMMUNITY): Payer: Medicare Other

## 2016-08-04 ENCOUNTER — Encounter (HOSPITAL_COMMUNITY): Payer: Self-pay | Admitting: Emergency Medicine

## 2016-08-04 ENCOUNTER — Inpatient Hospital Stay (HOSPITAL_COMMUNITY)
Admission: EM | Admit: 2016-08-04 | Discharge: 2016-08-07 | DRG: 190 | Disposition: A | Discharge status: Home / Self Care | Payer: Medicare Other | Attending: Family Medicine | Admitting: Family Medicine

## 2016-08-04 DIAGNOSIS — J449 Chronic obstructive pulmonary disease, unspecified: Secondary | ICD-10-CM | POA: Diagnosis present

## 2016-08-04 DIAGNOSIS — F32A Depression, unspecified: Secondary | ICD-10-CM | POA: Diagnosis present

## 2016-08-04 DIAGNOSIS — E43 Unspecified severe protein-calorie malnutrition: Secondary | ICD-10-CM | POA: Diagnosis not present

## 2016-08-04 DIAGNOSIS — Z86010 Personal history of colonic polyps: Secondary | ICD-10-CM | POA: Diagnosis not present

## 2016-08-04 DIAGNOSIS — Z79899 Other long term (current) drug therapy: Secondary | ICD-10-CM

## 2016-08-04 DIAGNOSIS — E785 Hyperlipidemia, unspecified: Secondary | ICD-10-CM | POA: Diagnosis present

## 2016-08-04 DIAGNOSIS — F329 Major depressive disorder, single episode, unspecified: Secondary | ICD-10-CM | POA: Diagnosis present

## 2016-08-04 DIAGNOSIS — R0602 Shortness of breath: Secondary | ICD-10-CM | POA: Diagnosis not present

## 2016-08-04 DIAGNOSIS — Z9981 Dependence on supplemental oxygen: Secondary | ICD-10-CM

## 2016-08-04 DIAGNOSIS — J9621 Acute and chronic respiratory failure with hypoxia: Secondary | ICD-10-CM | POA: Diagnosis present

## 2016-08-04 DIAGNOSIS — Z681 Body mass index (BMI) 19 or less, adult: Secondary | ICD-10-CM

## 2016-08-04 DIAGNOSIS — I517 Cardiomegaly: Secondary | ICD-10-CM | POA: Diagnosis present

## 2016-08-04 DIAGNOSIS — M810 Age-related osteoporosis without current pathological fracture: Secondary | ICD-10-CM | POA: Diagnosis not present

## 2016-08-04 DIAGNOSIS — R069 Unspecified abnormalities of breathing: Secondary | ICD-10-CM | POA: Diagnosis not present

## 2016-08-04 DIAGNOSIS — Z86018 Personal history of other benign neoplasm: Secondary | ICD-10-CM | POA: Diagnosis not present

## 2016-08-04 DIAGNOSIS — Z87891 Personal history of nicotine dependence: Secondary | ICD-10-CM

## 2016-08-04 DIAGNOSIS — Z7951 Long term (current) use of inhaled steroids: Secondary | ICD-10-CM

## 2016-08-04 DIAGNOSIS — Z7982 Long term (current) use of aspirin: Secondary | ICD-10-CM | POA: Diagnosis not present

## 2016-08-04 DIAGNOSIS — J441 Chronic obstructive pulmonary disease with (acute) exacerbation: Secondary | ICD-10-CM | POA: Diagnosis not present

## 2016-08-04 DIAGNOSIS — F419 Anxiety disorder, unspecified: Secondary | ICD-10-CM

## 2016-08-04 DIAGNOSIS — J9602 Acute respiratory failure with hypercapnia: Secondary | ICD-10-CM

## 2016-08-04 DIAGNOSIS — I5030 Unspecified diastolic (congestive) heart failure: Secondary | ICD-10-CM | POA: Diagnosis present

## 2016-08-04 DIAGNOSIS — J9601 Acute respiratory failure with hypoxia: Secondary | ICD-10-CM | POA: Diagnosis not present

## 2016-08-04 DIAGNOSIS — J9622 Acute and chronic respiratory failure with hypercapnia: Secondary | ICD-10-CM | POA: Diagnosis not present

## 2016-08-04 DIAGNOSIS — F411 Generalized anxiety disorder: Secondary | ICD-10-CM | POA: Diagnosis present

## 2016-08-04 DIAGNOSIS — F4320 Adjustment disorder, unspecified: Secondary | ICD-10-CM | POA: Diagnosis present

## 2016-08-04 DIAGNOSIS — Z888 Allergy status to other drugs, medicaments and biological substances status: Secondary | ICD-10-CM | POA: Diagnosis not present

## 2016-08-04 LAB — CBC WITH DIFFERENTIAL/PLATELET
BASOS ABS: 0 10*3/uL (ref 0.0–0.1)
BASOS PCT: 0 %
Eosinophils Absolute: 0 10*3/uL (ref 0.0–0.7)
Eosinophils Relative: 0 %
HCT: 49.8 % — ABNORMAL HIGH (ref 36.0–46.0)
Hemoglobin: 16.8 g/dL — ABNORMAL HIGH (ref 12.0–15.0)
Lymphocytes Relative: 10 %
Lymphs Abs: 1.3 10*3/uL (ref 0.7–4.0)
MCH: 32.6 pg (ref 26.0–34.0)
MCHC: 33.7 g/dL (ref 30.0–36.0)
MCV: 96.7 fL (ref 78.0–100.0)
MONO ABS: 0.5 10*3/uL (ref 0.1–1.0)
Monocytes Relative: 4 %
NEUTROS ABS: 11.8 10*3/uL — AB (ref 1.7–7.7)
Neutrophils Relative %: 86 %
PLATELETS: 182 10*3/uL (ref 150–400)
RBC: 5.15 MIL/uL — ABNORMAL HIGH (ref 3.87–5.11)
RDW: 13.4 % (ref 11.5–15.5)
WBC: 13.6 10*3/uL — AB (ref 4.0–10.5)

## 2016-08-04 LAB — I-STAT CHEM 8, ED
BUN: 29 mg/dL — ABNORMAL HIGH (ref 6–20)
CREATININE: 0.7 mg/dL (ref 0.44–1.00)
Calcium, Ion: 1.17 mmol/L (ref 1.15–1.40)
Chloride: 99 mmol/L — ABNORMAL LOW (ref 101–111)
Glucose, Bld: 128 mg/dL — ABNORMAL HIGH (ref 65–99)
HEMATOCRIT: 52 % — AB (ref 36.0–46.0)
HEMOGLOBIN: 17.7 g/dL — AB (ref 12.0–15.0)
POTASSIUM: 4 mmol/L (ref 3.5–5.1)
Sodium: 138 mmol/L (ref 135–145)
TCO2: 32 mmol/L (ref 0–100)

## 2016-08-04 LAB — I-STAT CG4 LACTIC ACID, ED: LACTIC ACID, VENOUS: 1.33 mmol/L (ref 0.5–1.9)

## 2016-08-04 LAB — BLOOD GAS, ARTERIAL
ACID-BASE EXCESS: 5.8 mmol/L — AB (ref 0.0–2.0)
BICARBONATE: 31.6 mmol/L — AB (ref 20.0–28.0)
Drawn by: 232811
O2 CONTENT: 2 L/min
O2 SAT: 97.8 %
PCO2 ART: 49.1 mmHg — AB (ref 32.0–48.0)
PO2 ART: 104 mmHg (ref 83.0–108.0)
Patient temperature: 97.4
pH, Arterial: 7.42 (ref 7.350–7.450)

## 2016-08-04 LAB — I-STAT TROPONIN, ED: TROPONIN I, POC: 0 ng/mL (ref 0.00–0.08)

## 2016-08-04 MED ORDER — ALBUTEROL SULFATE (2.5 MG/3ML) 0.083% IN NEBU: 5.0000 mg | INHALATION_SOLUTION | Freq: Once | RESPIRATORY_TRACT | Status: DC

## 2016-08-04 MED ORDER — ALBUTEROL (5 MG/ML) CONTINUOUS INHALATION SOLN
15.0000 mg/h | INHALATION_SOLUTION | Freq: Once | RESPIRATORY_TRACT | Status: AC
  Administered 2016-08-04: 15 mg/h via RESPIRATORY_TRACT
  Filled 2016-08-04: qty 20

## 2016-08-04 NOTE — ED Triage Notes (Signed)
Patient arrives by EMS from home with complaints of respiratory distress. Family stated patient unable to get out of bed today due to SOB/EMS states decreased breath sounds and wheezing in all fields. O2 sat 93% on RA-severe dyspnea. Family seen at [facility] last week-"left lung not functioning". Patient lives alone. Combative with EMS.

## 2016-08-04 NOTE — ED Provider Notes (Signed)
[facility] DEPT Provider Note   CSN: [id number] Arrival date & time: 08/04/16  2158    By signing my name below, I, Macon Large, attest that this documentation has been prepared under the direction and in the presence of  Junius Creamer, NP. Electronically Signed: Macon Large, ED Scribe. 08/04/16. 10:26 PM.  History   Chief Complaint Chief Complaint  Patient presents with  . Respiratory Distress   The history is provided by the patient and a relative. No language interpreter was used.   HPI Comments: EMELI GOGUEN is a 71 y.o. female with PMHx of COPD, chronic bronchitis, asthma, anxiety/depression brought in by ambulance who presents to the Emergency Department complaining of severe, constant, SOB accompanied by palpations onset a couple of hours ago. Pt was last seen in the ED on 07/25/2015 for similar symptoms. Pt's relative stated pt was unable to get out of bed this morning due to having trouble breathing, they last spoke 12 hours ago Per nurse note, EMS reports Pt had decreased breath sounds followed by wheezing in all fields, she was confused and combative. This has improved since Albuterol treatment. Per nurse note, she was administered albuterol ('15mg'$ ) followed with Atrovent ('1mg'$ ) and Solumedrol ('125mg'$ ) IV. Pt's relative notes minimal relief with SOB. Pt notes she lives alone at home. She states she uses her oxygen tank at home throughout the night but denies using it throughout the day. Denies fever and cough.   Past Medical History:  Diagnosis Date  . Acute angle-closure glaucoma 02/14/2010  . ALLERGIC RHINITIS 09/19/2009  . ANXIETY 04/15/2010  . Arthritis   . ASTHMA 09/19/2009  . BACK PAIN 02/14/2010  . Chronic bronchitis 04/14/2011  . CHRONIC OBSTRUCTIVE PULMONARY DISEASE, ACUTE EXACERBATION 12/19/2009  . Complication of anesthesia   . COPD 06/27/2009  . DEPRESSION 09/19/2009  . HOARSENESS 10/17/2009  . HYPERLIPIDEMIA 09/19/2009  . MENOPAUSAL DISORDER 08/20/2010    . OSTEOPENIA 09/10/2010  . Osteoporosis, unspecified 04/20/2014  . Rotator cuff tear 06/23/2011  . TINNITUS 09/10/2010  . TREMOR 02/14/2010  . Tremor 07/30/2011  . URI 09/10/2010  . Wheezing 06/27/2009    Patient Active Problem List   Diagnosis Date Noted  . Acute respiratory failure with hypoxia ([place]) 07/26/2016  . Grief reaction 05/15/2016  . Rotator cuff tear arthropathy of both shoulders 04/17/2016  . Neck pain on right side 03/13/2016  . Angioedema 10/23/2015  . Cough 09/07/2015  . Benign neoplasm of meninges ([place]) 08/29/2015  . Dizziness and giddiness 08/29/2015  . Mild cognitive impairment 08/10/2015  . Benign paroxysmal positional vertigo 08/10/2015  . Hyperlipidemia 08/10/2015  . Memory dysfunction 07/03/2015  . Solitary pulmonary nodule 05/09/2015  . Right hip pain 01/23/2015  . Chest pain 10/25/2014  . Right sided sciatica 10/11/2014  . Chronic pain syndrome 10/11/2014  . Osteoporosis 04/20/2014  . COPD exacerbation ([place]) 08/25/2013  . General weakness 12/22/2012  . Personal history of colonic polyps 10/25/2012  . Anterior neck pain 10/25/2012  . COPD GOLD III/ still smoking  08/24/2012  . Shingles 07/14/2012  . Orthostatic hypotension 05/07/2012  . Involuntary movements 11/19/2011  . Tremor 07/30/2011  . Rotator cuff tear 06/23/2011  . Lower back pain 06/23/2011  . Balance problem 06/23/2011  . Tobacco abuse 05/22/2011  . Preop exam for internal medicine 05/13/2011  . Chronic bronchitis ([place]) 04/14/2011  . Peripheral edema 01/13/2011  . Right shoulder pain 01/13/2011  . Encounter for well adult exam with abnormal findings 10/31/2010  . TINNITUS 09/10/2010  . MENOPAUSAL DISORDER  08/20/2010  . Anxiety state 04/15/2010  . Acute angle-closure glaucoma 02/14/2010  . HOARSENESS 10/17/2009  . Depression 09/19/2009  . Allergic rhinitis 09/19/2009    Past Surgical History:  Procedure Laterality Date  . ABDOMINAL HYSTERECTOMY    . cervical fusion 2013 - Dr  Vertell Limber    . Normal Heart Cath  2001   Dr. Johnsie Cancel  . s/p laser tx for glaucoma     no vision loss    OB History    No data available       Home Medications    Prior to Admission medications   Medication Sig Start Date End Date Taking? Authorizing Provider  albuterol (PROVENTIL HFA) 108 (90 BASE) MCG/ACT inhaler Inhale 2 puffs into the lungs every 6 (six) hours as needed for wheezing or shortness of breath. 10/11/14   Biagio Borg, MD  albuterol (PROVENTIL) (2.5 MG/3ML) 0.083% nebulizer solution Take 3 mLs (2.5 mg total) by nebulization every 6 (six) hours as needed for wheezing or shortness of breath. 10/11/14   Biagio Borg, MD  aspirin 81 MG EC tablet Take 81 mg by mouth daily.      Historical Provider, MD  atorvastatin (LIPITOR) 20 MG tablet TAKE 1 TABLET BY MOUTH ONCE DAILY 12/31/15   Biagio Borg, MD  budesonide-formoterol [facility]) 160-4.5 MCG/ACT inhaler Inhale 2 puffs into the lungs 2 (two) times daily. 02/12/16   Biagio Borg, MD  clonazePAM (KLONOPIN) 0.5 MG tablet Take 1 tablet (0.5 mg total) by mouth 3 (three) times daily as needed for anxiety. 07/28/16   Barton Dubois, MD  dextromethorphan-guaiFENesin [facility] DM) 30-600 MG 12hr tablet Take 2 tablets by mouth 2 (two) times daily. 07/28/16   Barton Dubois, MD  donepezil (ARICEPT) 10 MG tablet Take 1 tablet daily 04/16/16   Cameron Sprang, MD  escitalopram (LEXAPRO) 20 MG tablet TAKE 1 TABLET BY MOUTH ONCE DAILY 01/11/16   Biagio Borg, MD  feeding supplement, ENSURE ENLIVE, (ENSURE ENLIVE) LIQD Take 237 mLs by mouth 2 (two) times daily between meals. 07/28/16   Barton Dubois, MD  hydrOXYzine (ATARAX/VISTARIL) 10 MG tablet Take 10 mg by mouth 3 (three) times daily as needed for anxiety. 06/25/16   Historical Provider, MD  levofloxacin (LEVAQUIN) 500 MG tablet Take 1 tablet (500 mg total) by mouth daily. 07/28/16 08/05/16  Barton Dubois, MD  loratadine (CLARITIN) 10 MG tablet Take 1 tablet (10 mg total) by mouth daily. 07/29/16   Barton Dubois,  MD  montelukast (SINGULAIR) 10 MG tablet TAKE 1 TABLET BY MOUTH EVERY NIGHT AT BEDTIME 03/26/16   Biagio Borg, MD  nystatin (MYCOSTATIN) 100000 UNIT/ML suspension Take 5 mLs (500,000 Units total) by mouth 4 (four) times daily. Swish in mouth and spit out 07/28/16   Barton Dubois, MD  predniSONE (DELTASONE) 20 MG tablet Take 3 tablets by mouth daily X 3 days; then 2 tablets by mouth daily X 3 days; then 1 tablet by mouth daily X 3 days; then start using '10mg'$  (1/2) by mouth daily. 07/28/16   Barton Dubois, MD  tiotropium (SPIRIVA HANDIHALER) 18 MCG inhalation capsule Place 1 capsule (18 mcg total) into inhaler and inhale daily. 05/15/16   Biagio Borg, MD    Family History Family History  Problem Relation Age of Onset  . Dementia Mother   . Rheum arthritis Maternal Uncle   . Colon cancer Neg Hx     Social History Social History  Substance Use Topics  . Smoking status: Former  Smoker    Packs/day: 1.00    Years: 40.00    Types: Cigarettes    Start date: 07/03/2015  . Smokeless tobacco: Never Used  . Alcohol use No     Allergies   Pravastatin; Simvastatin; and Statins   Review of Systems Review of Systems  Constitutional: Negative for chills and fever.  Respiratory: Positive for shortness of breath and wheezing. Negative for cough.   Cardiovascular: Positive for palpitations.  All other systems reviewed and are negative.    Physical Exam Updated Vital Signs BP 161/90 (BP Location: Left Arm)   Pulse (!) 123   Temp 98.1 F (36.7 C) (Oral)   SpO2 99%   Physical Exam  Constitutional: She appears well-developed. She appears distressed.  HENT:  Head: Normocephalic.  Eyes: Pupils are equal, round, and reactive to light.  Neck: Normal range of motion.  Cardiovascular: An irregular rhythm present. Tachycardia present.   Pulmonary/Chest: She is in respiratory distress. She has wheezes.  Musculoskeletal: Normal range of motion.  Neurological: She is alert.  Skin: Skin is warm  and dry. There is pallor.  Nursing note and vitals reviewed.    ED Treatments / Results   DIAGNOSTIC STUDIES: Oxygen Saturation is 99% on [place], normal by my interpretation.    COORDINATION OF CARE: 10:13 PM Discussed treatment plan with pt at bedside which includes continuous neb bipap and pt agreed to plan.   Labs (all labs ordered are listed, but only abnormal results are displayed) Labs Reviewed  CBC WITH DIFFERENTIAL/PLATELET  I-STAT CHEM 8, ED  I-STAT TROPOININ, ED  I-STAT ARTERIAL BLOOD GAS, ED  I-STAT CG4 LACTIC ACID, ED    EKG  EKG Interpretation None       Radiology No results found.  Procedures Procedures (including critical care time)  Medications Ordered in ED Medications  albuterol (PROVENTIL) (2.5 MG/3ML) 0.083% nebulizer solution 5 mg (not administered)  albuterol (PROVENTIL,VENTOLIN) solution continuous neb (not administered)     Initial Impression / Assessment and Plan / ED Course  I have reviewed the triage vital signs and the nursing notes.  Pertinent labs & imaging results that were available during my care of the patient were reviewed by me and considered in my medical decision making (see chart for details).  Clinical Course   Patient will receive a Albuterol'15mg'$  continuous neb, And be placed on BiPAP as she is tiring I will obtain an ABG CBC i-STAT chest x-ray troponin EKG    Final Clinical Impressions(s) / ED Diagnoses   Final diagnoses:  None    New Prescriptions New Prescriptions   No medications on file    I personally performed the services described in this documentation, which was scribed in my presence. The recorded information has been reviewed and is accurate.    Junius Creamer, NP 08/04/16 [place], MD 08/06/16 1247

## 2016-08-04 NOTE — ED Triage Notes (Signed)
EMS administered albuterol 15 mg and 1 mg Atrovent and Solumedrol 125 mg IV-more alert at this time.

## 2016-08-04 NOTE — Progress Notes (Signed)
Pt requesting to come off bipap at this time.  NP at bedside, ok with trial off bipap.  Bipap mask removed and pt placed on 2lnc, HR128, RR18, spo2 97%.  RT will continue to monitor and assess pt as needed.  Bipap remains in room on standby.

## 2016-08-04 NOTE — ED Notes (Signed)
RT made aware of need for bipap

## 2016-08-04 NOTE — ED Notes (Signed)
Bed: MV67 Expected date:  Expected time:  Means of arrival:  Comments: EMS respiratory distress

## 2016-08-05 ENCOUNTER — Encounter (HOSPITAL_COMMUNITY): Payer: Self-pay | Admitting: Internal Medicine

## 2016-08-05 DIAGNOSIS — J9601 Acute respiratory failure with hypoxia: Secondary | ICD-10-CM

## 2016-08-05 DIAGNOSIS — J449 Chronic obstructive pulmonary disease, unspecified: Secondary | ICD-10-CM | POA: Diagnosis present

## 2016-08-05 DIAGNOSIS — J9602 Acute respiratory failure with hypercapnia: Secondary | ICD-10-CM

## 2016-08-05 DIAGNOSIS — J441 Chronic obstructive pulmonary disease with (acute) exacerbation: Secondary | ICD-10-CM | POA: Diagnosis not present

## 2016-08-05 LAB — CBC WITH DIFFERENTIAL/PLATELET
Basophils Absolute: 0 10*3/uL (ref 0.0–0.1)
Basophils Relative: 0 %
EOS PCT: 0 %
Eosinophils Absolute: 0 10*3/uL (ref 0.0–0.7)
HEMATOCRIT: 54.4 % — AB (ref 36.0–46.0)
Hemoglobin: 18 g/dL — ABNORMAL HIGH (ref 12.0–15.0)
LYMPHS ABS: 0.2 10*3/uL — AB (ref 0.7–4.0)
LYMPHS PCT: 1 %
MCH: 33.3 pg (ref 26.0–34.0)
MCHC: 33.1 g/dL (ref 30.0–36.0)
MCV: 100.6 fL — AB (ref 78.0–100.0)
Monocytes Absolute: 0.3 10*3/uL (ref 0.1–1.0)
Monocytes Relative: 1 %
Neutro Abs: 21.2 10*3/uL — ABNORMAL HIGH (ref 1.7–7.7)
Neutrophils Relative %: 98 %
PLATELETS: 213 10*3/uL (ref 150–400)
RBC: 5.41 MIL/uL — ABNORMAL HIGH (ref 3.87–5.11)
RDW: 13.8 % (ref 11.5–15.5)
WBC: 21.7 10*3/uL — ABNORMAL HIGH (ref 4.0–10.5)

## 2016-08-05 LAB — BLOOD GAS, ARTERIAL
ACID-BASE EXCESS: 8.5 mmol/L — AB (ref 0.0–2.0)
BICARBONATE: 33.6 mmol/L — AB (ref 20.0–28.0)
DRAWN BY: 295031
O2 Content: 2 L/min
O2 Saturation: 96.4 %
PCO2 ART: 48.5 mmHg — AB (ref 32.0–48.0)
PH ART: 7.456 — AB (ref 7.350–7.450)
PO2 ART: 84.6 mmHg (ref 83.0–108.0)
Patient temperature: 98.6

## 2016-08-05 LAB — GLUCOSE, CAPILLARY
GLUCOSE-CAPILLARY: 140 mg/dL — AB (ref 65–99)
GLUCOSE-CAPILLARY: 130 mg/dL — AB (ref 65–99)
Glucose-Capillary: 119 mg/dL — ABNORMAL HIGH (ref 65–99)

## 2016-08-05 LAB — MRSA PCR SCREENING: MRSA BY PCR: NEGATIVE

## 2016-08-05 LAB — BASIC METABOLIC PANEL
Anion gap: 10 (ref 5–15)
BUN: 34 mg/dL — AB (ref 6–20)
CHLORIDE: 98 mmol/L — AB (ref 101–111)
CO2: 31 mmol/L (ref 22–32)
CREATININE: 0.6 mg/dL (ref 0.44–1.00)
Calcium: 8.5 mg/dL — ABNORMAL LOW (ref 8.9–10.3)
GFR calc Af Amer: >60 mL/min (ref >60)
Glucose, Bld: 156 mg/dL — ABNORMAL HIGH (ref 65–99)
Potassium: 4 mmol/L (ref 3.5–5.1)
SODIUM: 139 mmol/L (ref 135–145)

## 2016-08-05 LAB — COMPREHENSIVE METABOLIC PANEL
ALT: 25 U/L (ref 14–54)
AST: 21 U/L (ref 15–41)
Albumin: 4.1 g/dL (ref 3.5–5.0)
Alkaline Phosphatase: 53 U/L (ref 38–126)
Anion gap: 14 (ref 5–15)
BILIRUBIN TOTAL: 0.7 mg/dL (ref 0.3–1.2)
BUN: 35 mg/dL — ABNORMAL HIGH (ref 6–20)
CHLORIDE: 95 mmol/L — AB (ref 101–111)
CO2: 32 mmol/L (ref 22–32)
CREATININE: 0.86 mg/dL (ref 0.44–1.00)
Calcium: 9.5 mg/dL (ref 8.9–10.3)
Glucose, Bld: 172 mg/dL — ABNORMAL HIGH (ref 65–99)
POTASSIUM: 5.6 mmol/L — AB (ref 3.5–5.1)
Sodium: 141 mmol/L (ref 135–145)
Total Protein: 7 g/dL (ref 6.5–8.1)

## 2016-08-05 LAB — TROPONIN I: Troponin I: <0.03 ng/mL (ref <0.03)

## 2016-08-05 LAB — BRAIN NATRIURETIC PEPTIDE: B Natriuretic Peptide: 52.9 pg/mL (ref 0.0–100.0)

## 2016-08-05 MED ORDER — ALBUTEROL SULFATE (2.5 MG/3ML) 0.083% IN NEBU
2.5000 mg | INHALATION_SOLUTION | Freq: Once | RESPIRATORY_TRACT | Status: AC
  Administered 2016-08-05: 2.5 mg via RESPIRATORY_TRACT
  Filled 2016-08-05: qty 3

## 2016-08-05 MED ORDER — IPRATROPIUM-ALBUTEROL 0.5-2.5 (3) MG/3ML IN SOLN: 3.0000 mL | RESPIRATORY_TRACT | Status: DC | PRN

## 2016-08-05 MED ORDER — ATORVASTATIN CALCIUM 10 MG PO TABS
20.0000 mg | ORAL_TABLET | Freq: Every day | ORAL | Status: DC
  Administered 2016-08-05 – 2016-08-07 (×3): 20 mg via ORAL
  Filled 2016-08-05 (×3): qty 2
  Filled 2016-08-05: qty 1

## 2016-08-05 MED ORDER — ESCITALOPRAM OXALATE 20 MG PO TABS
20.0000 mg | ORAL_TABLET | Freq: Every day | ORAL | Status: DC
  Administered 2016-08-05 – 2016-08-07 (×3): 20 mg via ORAL
  Filled 2016-08-05 (×3): qty 1

## 2016-08-05 MED ORDER — ONDANSETRON HCL 4 MG/2ML IJ SOLN: 4.0000 mg | Freq: Four times a day (QID) | INTRAMUSCULAR | Status: DC | PRN

## 2016-08-05 MED ORDER — ASPIRIN EC 81 MG PO TBEC
81.0000 mg | DELAYED_RELEASE_TABLET | Freq: Every day | ORAL | Status: DC
  Administered 2016-08-05 – 2016-08-07 (×3): 81 mg via ORAL
  Filled 2016-08-05 (×4): qty 1

## 2016-08-05 MED ORDER — ONDANSETRON HCL 4 MG PO TABS: 4.0000 mg | ORAL_TABLET | Freq: Four times a day (QID) | ORAL | Status: DC | PRN

## 2016-08-05 MED ORDER — IPRATROPIUM-ALBUTEROL 0.5-2.5 (3) MG/3ML IN SOLN
3.0000 mL | RESPIRATORY_TRACT | Status: DC
  Administered 2016-08-05 (×6): 3 mL via RESPIRATORY_TRACT
  Filled 2016-08-05 (×6): qty 3

## 2016-08-05 MED ORDER — DM-GUAIFENESIN ER 30-600 MG PO TB12
2.0000 | ORAL_TABLET | Freq: Two times a day (BID) | ORAL | Status: DC
  Administered 2016-08-05 – 2016-08-07 (×5): 2 via ORAL
  Filled 2016-08-05 (×3): qty 2
  Filled 2016-08-05: qty 1
  Filled 2016-08-05 (×2): qty 2

## 2016-08-05 MED ORDER — LORATADINE 10 MG PO TABS
10.0000 mg | ORAL_TABLET | Freq: Every day | ORAL | Status: DC
  Administered 2016-08-05 – 2016-08-07 (×3): 10 mg via ORAL
  Filled 2016-08-05 (×3): qty 1

## 2016-08-05 MED ORDER — DONEPEZIL HCL 5 MG PO TABS
10.0000 mg | ORAL_TABLET | Freq: Every day | ORAL | Status: DC
  Administered 2016-08-05 – 2016-08-06 (×2): 10 mg via ORAL
  Filled 2016-08-05: qty 1
  Filled 2016-08-05: qty 2

## 2016-08-05 MED ORDER — ACETAMINOPHEN 325 MG PO TABS: 650.0000 mg | ORAL_TABLET | Freq: Four times a day (QID) | ORAL | Status: DC | PRN

## 2016-08-05 MED ORDER — MONTELUKAST SODIUM 10 MG PO TABS
10.0000 mg | ORAL_TABLET | Freq: Every day | ORAL | Status: DC
  Administered 2016-08-05 – 2016-08-06 (×2): 10 mg via ORAL
  Filled 2016-08-05 (×2): qty 1

## 2016-08-05 MED ORDER — ENSURE ENLIVE PO LIQD
237.0000 mL | Freq: Two times a day (BID) | ORAL | Status: DC
  Administered 2016-08-07: 237 mL via ORAL
  Filled 2016-08-05 (×2): qty 237

## 2016-08-05 MED ORDER — CLONAZEPAM 0.5 MG PO TABS: 0.5000 mg | ORAL_TABLET | Freq: Three times a day (TID) | ORAL | Status: DC | PRN

## 2016-08-05 MED ORDER — ACETAMINOPHEN 650 MG RE SUPP: 650.0000 mg | Freq: Four times a day (QID) | RECTAL | Status: DC | PRN

## 2016-08-05 MED ORDER — METHYLPREDNISOLONE SODIUM SUCC 125 MG IJ SOLR
60.0000 mg | Freq: Two times a day (BID) | INTRAMUSCULAR | Status: DC
  Administered 2016-08-05 – 2016-08-06 (×4): 60 mg via INTRAVENOUS
  Filled 2016-08-05 (×4): qty 2

## 2016-08-05 MED ORDER — ENOXAPARIN SODIUM 40 MG/0.4ML ~~LOC~~ SOLN
40.0000 mg | SUBCUTANEOUS | Status: DC
  Administered 2016-08-05 – 2016-08-07 (×3): 40 mg via SUBCUTANEOUS
  Filled 2016-08-05 (×4): qty 0.4

## 2016-08-05 MED ORDER — BUDESONIDE 0.25 MG/2ML IN SUSP
0.2500 mg | Freq: Two times a day (BID) | RESPIRATORY_TRACT | Status: DC
  Administered 2016-08-05 – 2016-08-07 (×4): 0.25 mg via RESPIRATORY_TRACT
  Filled 2016-08-05 (×5): qty 2

## 2016-08-05 MED ORDER — AZITHROMYCIN 500 MG IV SOLR
500.0000 mg | Freq: Every day | INTRAVENOUS | Status: DC
  Administered 2016-08-05 – 2016-08-06 (×3): 500 mg via INTRAVENOUS
  Filled 2016-08-05 (×4): qty 500

## 2016-08-05 NOTE — H&P (Signed)
History and Physical    Michele Martin GXQ:[id number] DOB: October 15, 1945 DOA: 08/04/2016  PCP: Cathlean Cower, MD  Patient coming from: Home.  Chief Complaint: Shortness of breath.  HPI: Michele Martin is a 71 y.o. female with COPD, diastolic dysfunction, tobacco abuse and anxiety presents to the ER because of worsening shortness of breath. Patient was recently discharged home after being admitted for COPD exacerbation last week. Patient's shortness of breath started yesterday morning. Patient's daughter states that she was doing fine the night before. Since they were not able to reach the patient over the phone they went to visit the patient yesterday morning and found patient was short of breath and confused. Patient was brought to the ER and was placed on BiPAP and steroids, nebulizer treatment. Chest x-ray does not show any infiltrates. Patient is able to talk while being on the BiPAP. Patient is being admitted for acute respiratory failure with hypoxia and hypercarbia secondary to COPD exacerbation. Denies any chest pain fever chills. Has been having some productive cough.  ED Course: Nebulizer treatment and steroids were given. Patient was placed on BiPAP. Chest x-ray does not show any infiltrates. EKG shows sinus tachycardia with nonspecific ST changes.  Review of Systems: As per HPI, rest all negative.   Past Medical History:  Diagnosis Date  . Acute angle-closure glaucoma 02/14/2010  . ALLERGIC RHINITIS 09/19/2009  . ANXIETY 04/15/2010  . Arthritis   . ASTHMA 09/19/2009  . BACK PAIN 02/14/2010  . Chronic bronchitis 04/14/2011  . CHRONIC OBSTRUCTIVE PULMONARY DISEASE, ACUTE EXACERBATION 12/19/2009  . Complication of anesthesia   . COPD 06/27/2009  . DEPRESSION 09/19/2009  . HOARSENESS 10/17/2009  . HYPERLIPIDEMIA 09/19/2009  . MENOPAUSAL DISORDER 08/20/2010  . OSTEOPENIA 09/10/2010  . Osteoporosis, unspecified 04/20/2014  . Rotator cuff tear 06/23/2011  . TINNITUS 09/10/2010  .  TREMOR 02/14/2010  . Tremor 07/30/2011  . URI 09/10/2010  . Wheezing 06/27/2009    Past Surgical History:  Procedure Laterality Date  . ABDOMINAL HYSTERECTOMY    . cervical fusion 2013 - Dr Vertell Limber    . Normal Heart Cath  2001   Dr. Johnsie Cancel  . s/p laser tx for glaucoma     no vision loss     reports that she has quit smoking. Her smoking use included Cigarettes. She started smoking about 13 months ago. She has a 40.00 pack-year smoking history. She has never used smokeless tobacco. She reports that she does not drink alcohol or use drugs.  Allergies  Allergen Reactions  . Pravastatin Itching and Other (See Comments)    Myalgias/cramping  . Simvastatin Itching and Other (See Comments)    Myalgias/cramping  . Statins Itching and Other (See Comments)    Myalgias/cramping    Family History  Problem Relation Age of Onset  . Dementia Mother   . Rheum arthritis Maternal Uncle   . Colon cancer Neg Hx     Prior to Admission medications   Medication Sig Start Date End Date Taking? Authorizing Provider  albuterol (PROVENTIL HFA) 108 (90 BASE) MCG/ACT inhaler Inhale 2 puffs into the lungs every 6 (six) hours as needed for wheezing or shortness of breath. 10/11/14   Biagio Borg, MD  albuterol (PROVENTIL) (2.5 MG/3ML) 0.083% nebulizer solution Take 3 mLs (2.5 mg total) by nebulization every 6 (six) hours as needed for wheezing or shortness of breath. 10/11/14   Biagio Borg, MD  aspirin 81 MG EC tablet Take 81 mg by mouth daily.  Historical Provider, MD  atorvastatin (LIPITOR) 20 MG tablet TAKE 1 TABLET BY MOUTH ONCE DAILY 12/31/15   Biagio Borg, MD  budesonide-formoterol [facility]) 160-4.5 MCG/ACT inhaler Inhale 2 puffs into the lungs 2 (two) times daily. 02/12/16   Biagio Borg, MD  clonazePAM (KLONOPIN) 0.5 MG tablet Take 1 tablet (0.5 mg total) by mouth 3 (three) times daily as needed for anxiety. 07/28/16   Barton Dubois, MD  dextromethorphan-guaiFENesin [facility] DM) 30-600 MG 12hr tablet  Take 2 tablets by mouth 2 (two) times daily. 07/28/16   Barton Dubois, MD  donepezil (ARICEPT) 10 MG tablet Take 1 tablet daily 04/16/16   Cameron Sprang, MD  escitalopram (LEXAPRO) 20 MG tablet TAKE 1 TABLET BY MOUTH ONCE DAILY 01/11/16   Biagio Borg, MD  feeding supplement, ENSURE ENLIVE, (ENSURE ENLIVE) LIQD Take 237 mLs by mouth 2 (two) times daily between meals. 07/28/16   Barton Dubois, MD  hydrOXYzine (ATARAX/VISTARIL) 10 MG tablet Take 10 mg by mouth 3 (three) times daily as needed for anxiety. 06/25/16   Historical Provider, MD  levofloxacin (LEVAQUIN) 500 MG tablet Take 1 tablet (500 mg total) by mouth daily. 07/28/16 08/05/16  Barton Dubois, MD  loratadine (CLARITIN) 10 MG tablet Take 1 tablet (10 mg total) by mouth daily. 07/29/16   Barton Dubois, MD  montelukast (SINGULAIR) 10 MG tablet TAKE 1 TABLET BY MOUTH EVERY NIGHT AT BEDTIME 03/26/16   Biagio Borg, MD  nystatin (MYCOSTATIN) 100000 UNIT/ML suspension Take 5 mLs (500,000 Units total) by mouth 4 (four) times daily. Swish in mouth and spit out 07/28/16   Barton Dubois, MD  predniSONE (DELTASONE) 20 MG tablet Take 3 tablets by mouth daily X 3 days; then 2 tablets by mouth daily X 3 days; then 1 tablet by mouth daily X 3 days; then start using '10mg'$  (1/2) by mouth daily. 07/28/16   Barton Dubois, MD  tiotropium (SPIRIVA HANDIHALER) 18 MCG inhalation capsule Place 1 capsule (18 mcg total) into inhaler and inhale daily. 05/15/16   Biagio Borg, MD    Physical Exam: Vitals:   08/04/16 2353 08/05/16 0130 08/05/16 0144 08/05/16 0156  BP: 131/92 110/76    Pulse: (!) 130 (!) 122  (!) 124  Resp: 25   (!) 27  Temp:      TempSrc:      SpO2: 97% 98% 98% 98%      Constitutional: Moderately built and nourished. Vitals:   08/04/16 2353 08/05/16 0130 08/05/16 0144 08/05/16 0156  BP: 131/92 110/76    Pulse: (!) 130 (!) 122  (!) 124  Resp: 25   (!) 27  Temp:      TempSrc:      SpO2: 97% 98% 98% 98%   Eyes: Anicteric no pallor. ENMT: No discharge  from the ears eyes nose or mouth. Neck: No JVD appreciated no mass felt. Respiratory: Bilateral expiratory wheeze and no crepitations. Cardiovascular: S1-S2. No murmurs appreciated. Abdomen: Soft nontender bowel sounds present. Musculoskeletal: No edema. No joint effusion. Skin: No rash. Skin appears warm. Neurologic: Alert awake oriented to time place and person. Moves all extremities. Psychiatric: Appears normal. Normal affect.   Labs on Admission: I have personally reviewed following labs and imaging studies  CBC:  Recent Labs Lab 08/04/16 2242 08/04/16 2254  WBC 13.6*  --   NEUTROABS 11.8*  --   HGB 16.8* 17.7*  HCT 49.8* 52.0*  MCV 96.7  --   PLT 182  --    Basic Metabolic  Panel:  Recent Labs Lab 08/04/16 2254  NA 138  K 4.0  CL 99*  GLUCOSE 128*  BUN 29*  CREATININE 0.70   GFR: Estimated Creatinine Clearance: 48.7 mL/min (by C-G formula based on SCr of 0.7 mg/dL). Liver Function Tests: No results for input(s): AST, ALT, ALKPHOS, BILITOT, PROT, ALBUMIN in the last 168 hours. No results for input(s): LIPASE, AMYLASE in the last 168 hours. No results for input(s): AMMONIA in the last 168 hours. Coagulation Profile: No results for input(s): INR, PROTIME in the last 168 hours. Cardiac Enzymes: No results for input(s): CKTOTAL, CKMB, CKMBINDEX, TROPONINI in the last 168 hours. BNP (last 3 results) No results for input(s): PROBNP in the last 8760 hours. HbA1C: No results for input(s): HGBA1C in the last 72 hours. CBG: No results for input(s): GLUCAP in the last 168 hours. Lipid Profile: No results for input(s): CHOL, HDL, LDLCALC, TRIG, CHOLHDL, LDLDIRECT in the last 72 hours. Thyroid Function Tests: No results for input(s): TSH, T4TOTAL, FREET4, T3FREE, THYROIDAB in the last 72 hours. Anemia Panel: No results for input(s): VITAMINB12, FOLATE, FERRITIN, TIBC, IRON, RETICCTPCT in the last 72 hours. Urine analysis:    Component Value Date/Time   COLORURINE  YELLOW 08/09/2015 1504   APPEARANCEUR CLEAR 08/09/2015 1504   LABSPEC <=1.005 (A) 08/09/2015 1504   PHURINE 6.0 08/09/2015 1504   GLUCOSEU NEGATIVE 08/09/2015 1504   HGBUR NEGATIVE 08/09/2015 1504   BILIRUBINUR NEGATIVE 08/09/2015 1504   KETONESUR NEGATIVE 08/09/2015 1504   UROBILINOGEN 0.2 08/09/2015 1504   NITRITE NEGATIVE 08/09/2015 1504   LEUKOCYTESUR NEGATIVE 08/09/2015 1504   Sepsis Labs: '@LABRCNTIP'$ (procalcitonin:4,lacticidven:4) )No results found for this or any previous visit (from the past 240 hour(s)).   Radiological Exams on Admission: Dg Chest 2 View  Result Date: 08/04/2016 CLINICAL DATA:  Shortness of breath for 72 hours EXAM: CHEST  2 VIEW COMPARISON:  07/26/2016 FINDINGS: There is bilateral hyperinflation. There is no focal pulmonary infiltrate, consolidation, or pleural effusion. The cardiomediastinal silhouette is stable. There is atherosclerosis. There is no pneumothorax. IMPRESSION: Hyperinflation.  No acute pulmonary infiltrate. Electronically Signed   By: Donavan Foil M.D.   On: 08/04/2016 22:39    EKG: Independently reviewed. Sinus tachycardia with nonspecific changes.  Assessment/Plan Principal Problem:   Acute respiratory failure with hypoxia and hypercapnia (HCC) Active Problems:   Anxiety state   COPD exacerbation (HCC)   COPD (chronic obstructive pulmonary disease) ([place])    1. Acute respiratory failure with hypoxia and hypercarbia secondary to COPD exacerbation - I have placed patient on Solu-Medrol 60 mg IV every 12 along with DuoNeb's when necessary scheduled and Zithromax. Pulmicort. Continue BiPAP for now. Patient also has been placed on nothing by mouth in case patient requires intubation. 2. Diastolic dysfunction per 2-D echo on 07/27/2016 with EF of 60-65% with grade 1 diastolic dysfunction - no signs of fluid overload. 3. Anxiety state - on Klonopin and Lexapro. 4. Tobacco abuse  - tobacco cessation counseling requested. 5. Hyperlipidemia on  statins.    DVT prophylaxis: Lovenox. Code [place]atus: Full code. Family Communication - Patient's daughter and granddaughter. Disposition Plan: Home.  Consults called: None.  Admission status: Observation.    Rise Patience MD [facility] Pager [phone].  If 7PM-7AM, please contact night-coverage [email or web address] Password [email or web address]  08/05/2016, 2:47 AM

## 2016-08-05 NOTE — ED Notes (Signed)
Patient has taken off bipap and monitor leads stating " I'm going home. I've been here for 2 days and I'm hungry and thirsty and I need to go to the bathroom." This writer walked patient to the bathroom. The patient refuses to get back in the bed nor put monitor or bipap back on.

## 2016-08-05 NOTE — Progress Notes (Signed)
Pt currently on 2 LPM [place] and tolerating well at this time.  Pt in no noted distress and states she is breathing comfortably at this time.  RT will hold BIPAP at this time, RT to monitor and assess as needed.

## 2016-08-05 NOTE — Progress Notes (Signed)
Patient admitted overnight for COPD exacerbation just 1 week from recent discharge. Seen in ED prior to transfer to SDU, patient stating she feels better after BiPAP and is hungry and mad at her sister for making her come back. She wants to go home.   On exam, she appears elderly and diffusely tremulous, in tripod. Tight lung sounds with inspiratory and expiratory wheezing. Speaking in short sentences, belching frequently. Tachycardic without JVD or pedal edema. She has remained afebrile and recently received bronchodilators. Been off BiPAP for about 10 minutes at time of exam.   Discussed in earnest my concerns for her going home right now, namely that she has current signs of respiratory decompensation which would be expected only to worsen without temporary NIPPV, resulting most likely in return to ED within minutes to hours and/or death. I also discussed that high potassium may lead to fatal arrhythmias without warning signs. After discussion of these concerns, we agreed that she would restart BiPAP, transfer to SDU, and would remain until reevaluation in the afternoon.  - Continue BiPAP, bronchodilators, steroids, empiric abx - Can start diet once she remains stable off BiPAP - Repeat BMP - Recommend offering patient's home clonazepam as needed. Risk of respiratory depression outweighed by anxiolytic effects at this time.  Vance Gather, MD 08/05/2016 7:57 AM

## 2016-08-05 NOTE — Progress Notes (Signed)
Pt noted for increased wob/respiratory distress with wheezes throughout lung fields.  Pt given nebulizer treatment and placed back on bipap per previous settings.  Baker Janus, NP, and RN were made aware.  RT will continue to monitor and assess pt.

## 2016-08-06 ENCOUNTER — Inpatient Hospital Stay: Payer: Medicare Other | Admitting: Internal Medicine

## 2016-08-06 DIAGNOSIS — F411 Generalized anxiety disorder: Secondary | ICD-10-CM | POA: Diagnosis present

## 2016-08-06 DIAGNOSIS — Z888 Allergy status to other drugs, medicaments and biological substances status: Secondary | ICD-10-CM | POA: Diagnosis not present

## 2016-08-06 DIAGNOSIS — Z681 Body mass index (BMI) 19 or less, adult: Secondary | ICD-10-CM | POA: Diagnosis not present

## 2016-08-06 DIAGNOSIS — J9622 Acute and chronic respiratory failure with hypercapnia: Secondary | ICD-10-CM | POA: Diagnosis present

## 2016-08-06 DIAGNOSIS — Z7982 Long term (current) use of aspirin: Secondary | ICD-10-CM | POA: Diagnosis not present

## 2016-08-06 DIAGNOSIS — J441 Chronic obstructive pulmonary disease with (acute) exacerbation: Secondary | ICD-10-CM | POA: Diagnosis present

## 2016-08-06 DIAGNOSIS — E43 Unspecified severe protein-calorie malnutrition: Secondary | ICD-10-CM | POA: Diagnosis present

## 2016-08-06 DIAGNOSIS — I517 Cardiomegaly: Secondary | ICD-10-CM | POA: Diagnosis present

## 2016-08-06 DIAGNOSIS — F4320 Adjustment disorder, unspecified: Secondary | ICD-10-CM | POA: Diagnosis present

## 2016-08-06 DIAGNOSIS — Z86018 Personal history of other benign neoplasm: Secondary | ICD-10-CM | POA: Diagnosis not present

## 2016-08-06 DIAGNOSIS — J9602 Acute respiratory failure with hypercapnia: Secondary | ICD-10-CM | POA: Diagnosis not present

## 2016-08-06 DIAGNOSIS — J9601 Acute respiratory failure with hypoxia: Secondary | ICD-10-CM | POA: Diagnosis not present

## 2016-08-06 DIAGNOSIS — I5030 Unspecified diastolic (congestive) heart failure: Secondary | ICD-10-CM | POA: Diagnosis present

## 2016-08-06 DIAGNOSIS — Z79899 Other long term (current) drug therapy: Secondary | ICD-10-CM | POA: Diagnosis not present

## 2016-08-06 DIAGNOSIS — F329 Major depressive disorder, single episode, unspecified: Secondary | ICD-10-CM | POA: Diagnosis present

## 2016-08-06 DIAGNOSIS — M810 Age-related osteoporosis without current pathological fracture: Secondary | ICD-10-CM | POA: Diagnosis present

## 2016-08-06 DIAGNOSIS — Z87891 Personal history of nicotine dependence: Secondary | ICD-10-CM | POA: Diagnosis not present

## 2016-08-06 DIAGNOSIS — Z7951 Long term (current) use of inhaled steroids: Secondary | ICD-10-CM | POA: Diagnosis not present

## 2016-08-06 DIAGNOSIS — J9621 Acute and chronic respiratory failure with hypoxia: Secondary | ICD-10-CM | POA: Diagnosis present

## 2016-08-06 DIAGNOSIS — Z86010 Personal history of colonic polyps: Secondary | ICD-10-CM | POA: Diagnosis not present

## 2016-08-06 DIAGNOSIS — E785 Hyperlipidemia, unspecified: Secondary | ICD-10-CM | POA: Diagnosis present

## 2016-08-06 DIAGNOSIS — Z9981 Dependence on supplemental oxygen: Secondary | ICD-10-CM | POA: Diagnosis not present

## 2016-08-06 LAB — BASIC METABOLIC PANEL
ANION GAP: 7 (ref 5–15)
BUN: 37 mg/dL — ABNORMAL HIGH (ref 6–20)
CO2: 33 mmol/L — ABNORMAL HIGH (ref 22–32)
Calcium: 8.4 mg/dL — ABNORMAL LOW (ref 8.9–10.3)
Chloride: 98 mmol/L — ABNORMAL LOW (ref 101–111)
Creatinine, Ser: 0.52 mg/dL (ref 0.44–1.00)
Glucose, Bld: 134 mg/dL — ABNORMAL HIGH (ref 65–99)
POTASSIUM: 4.1 mmol/L (ref 3.5–5.1)
SODIUM: 138 mmol/L (ref 135–145)

## 2016-08-06 LAB — CBC
HCT: 45.3 % (ref 36.0–46.0)
HEMOGLOBIN: 15 g/dL (ref 12.0–15.0)
MCH: 32.3 pg (ref 26.0–34.0)
MCHC: 33.1 g/dL (ref 30.0–36.0)
MCV: 97.6 fL (ref 78.0–100.0)
Platelets: 181 10*3/uL (ref 150–400)
RBC: 4.64 MIL/uL (ref 3.87–5.11)
RDW: 13.7 % (ref 11.5–15.5)
WBC: 15.1 10*3/uL — AB (ref 4.0–10.5)

## 2016-08-06 LAB — TSH: TSH: 0.104 u[IU]/mL — AB (ref 0.350–4.500)

## 2016-08-06 LAB — GLUCOSE, CAPILLARY
GLUCOSE-CAPILLARY: 150 mg/dL — AB (ref 65–99)
GLUCOSE-CAPILLARY: 125 mg/dL — AB (ref 65–99)
GLUCOSE-CAPILLARY: 120 mg/dL — AB (ref 65–99)
GLUCOSE-CAPILLARY: 98 mg/dL (ref 65–99)
Glucose-Capillary: 142 mg/dL — ABNORMAL HIGH (ref 65–99)
Glucose-Capillary: 168 mg/dL — ABNORMAL HIGH (ref 65–99)

## 2016-08-06 MED ORDER — IPRATROPIUM-ALBUTEROL 0.5-2.5 (3) MG/3ML IN SOLN
3.0000 mL | Freq: Four times a day (QID) | RESPIRATORY_TRACT | Status: DC
  Administered 2016-08-06 – 2016-08-07 (×4): 3 mL via RESPIRATORY_TRACT
  Filled 2016-08-06 (×5): qty 3

## 2016-08-06 MED ORDER — PREDNISONE 50 MG PO TABS
50.0000 mg | ORAL_TABLET | Freq: Every day | ORAL | Status: DC
  Administered 2016-08-07: 50 mg via ORAL
  Filled 2016-08-06: qty 1

## 2016-08-06 NOTE — Progress Notes (Signed)
MD paged about patient ambulating around the unit.  Made aware of patient's O2 dropping slightly to lower 90's- higher 80's, and her HR going to the 130's but coming back down to baseline when she rests.   Will continue to monitor.

## 2016-08-06 NOTE — Progress Notes (Signed)
PROGRESS NOTE  REXANN LUERAS  OAC:[id number] DOB: January 30, 1946 DOA: 08/04/2016 PCP: Cathlean Cower, MD   Brief Narrative: Michele Martin is a 71 y.o. female with COPD, diastolic dysfunction, tobacco abuse and anxiety presented to the ER because of worsening shortness of breath. Patient was recently discharged home after being admitted for COPD exacerbation last week after improvement, restarted acutely day PTA. Family went to visit the patient yesterday morning and found patient was short of breath and confused. Patient was brought to the ER and was placed on BiPAP and steroids, nebulizer treatment. Chest x-ray does not show any infiltrates. Patient was admitted for acute respiratory failure with hypoxia and hypercarbia secondary to COPD exacerbation. BiPAP was rapidly weaned to 2L [place].   Assessment & Plan: Principal Problem:   Acute respiratory failure with hypoxia and hypercapnia (HCC) Active Problems:   Anxiety state   COPD exacerbation (HCC)   COPD (chronic obstructive pulmonary disease) (HCC)  Acute respiratory failure with hypoxia and hypercarbia secondary to COPD exacerbation: Wears nocturnal oxygen at home. - Deescalate steroids: Prednisone '50mg'$  x3 days to complete 5 days, last dose 1/13. - DuoNebs - Azithromycin '500mg'$  x3 days - Ambulate with pulse oximetry today, OOB BID w/assistance.  Diastolic dysfunction per 2-D echo on 07/27/2016 with EF of 60-65% with grade 1 diastolic dysfunction. Euvolemic. - SLIV  Anxiety/grief reaction: Exacerbated by daughter's passing in Sept 2017. - Suspect this is significant driver of dyspnea. Will continue Klonopin and Lexapro. - Recommend outpatient follow up.  - Will have inpatient counselor speak with patient  Tobacco abuse: - Brief cessation counseling provided  Hyperlipidemia: Chronic, stable.  - Continue statin  DVT prophylaxis: Lovenox Code Status: Full Family Communication: None at bedside Disposition Plan: DC home once oxygen  requirement improved.   Consultants:   None  Procedures:   BiPAP 1/9.  Antimicrobials:  Azithromycin '500mg'$  1/9 > 1/11  Subjective: Pt slept well, breathing better. Wants to walk around more. Feels anxiety played large role in dyspnea, now more calm and breathing fine. No fever, chest pain, palpitations, leg swelling, or orthopnea.  Objective: Vitals:   08/06/16 0500 08/06/16 0600 08/06/16 0700 08/06/16 0800  BP: (!) 156/120 (!) 153/109 (!) 159/83   Pulse: (!) 103 83 72   Resp: '19 19 14   '$ Temp:    97.5 F (36.4 C)  TempSrc:    Oral  SpO2: 97% 96% 99%   Weight:      Height:        Intake/Output Summary (Last 24 hours) at 08/06/16 0920 Last data filed at 08/06/16 0000  Gross per 24 hour  Intake              500 ml  Output                0 ml  Net              500 ml   Filed Weights   08/05/16 0830 08/06/16 0328  Weight: 45.6 kg (100 lb 8.5 oz) 45.4 kg (100 lb 1.4 oz)    Examination: General exam: 71 y.o. female in no distress Respiratory system: Non-labored breathing 1L by [place]. Prolonged expiration with scant EE wheezing. No crackles or rhonchi.  Cardiovascular system: Regular rate and rhythm. No murmur, rub, or gallop. No JVD, and no pedal edema. Gastrointestinal system: Abdomen soft, non-tender, non-distended, with normoactive bowel sounds. No organomegaly or masses felt. Central nervous system: Alert and oriented. No focal neurological deficits. Extremities: Warm, no deformities  Skin: No rashes, lesions, ulcers Psychiatry: Judgement and insight appear normal. Mood & affect appropriate.   Data Reviewed: I have personally reviewed following labs and imaging studies  CBC:  Recent Labs Lab 08/04/16 2242 08/04/16 2254 08/05/16 0315 08/06/16 0400  WBC 13.6*  --  21.7* 15.1*  NEUTROABS 11.8*  --  21.2*  --   HGB 16.8* 17.7* 18.0* 15.0  HCT 49.8* 52.0* 54.4* 45.3  MCV 96.7  --  100.6* 97.6  PLT 182  --  213 962   Basic Metabolic Panel:  Recent Labs Lab  08/04/16 2254 08/05/16 0315 08/05/16 0913 08/06/16 0400  NA 138 141 139 138  K 4.0 5.6* 4.0 4.1  CL 99* 95* 98* 98*  CO2  --  32 31 33*  GLUCOSE 128* 172* 156* 134*  BUN 29* 35* 34* 37*  CREATININE 0.70 0.86 0.60 0.52  CALCIUM  --  9.5 8.5* 8.4*   GFR: Estimated Creatinine Clearance: 46.2 mL/min (by C-G formula based on SCr of 0.52 mg/dL). Liver Function Tests:  Recent Labs Lab 08/05/16 0315  AST 21  ALT 25  ALKPHOS 53  BILITOT 0.7  PROT 7.0  ALBUMIN 4.1   No results for input(s): LIPASE, AMYLASE in the last 168 hours. No results for input(s): AMMONIA in the last 168 hours. Coagulation Profile: No results for input(s): INR, PROTIME in the last 168 hours. Cardiac Enzymes:  Recent Labs Lab 08/05/16 0913  TROPONINI <0.03   BNP (last 3 results) No results for input(s): PROBNP in the last 8760 hours. HbA1C: No results for input(s): HGBA1C in the last 72 hours. CBG:  Recent Labs Lab 08/05/16 1542 08/05/16 2031 08/06/16 0034 08/06/16 0324 08/06/16 0754  GLUCAP 119* 130* 168* 150* 125*   Lipid Profile: No results for input(s): CHOL, HDL, LDLCALC, TRIG, CHOLHDL, LDLDIRECT in the last 72 hours. Thyroid Function Tests:  Recent Labs  08/06/16 0400  TSH 0.104*   Anemia Panel: No results for input(s): VITAMINB12, FOLATE, FERRITIN, TIBC, IRON, RETICCTPCT in the last 72 hours. Urine analysis:    Component Value Date/Time   COLORURINE YELLOW 08/09/2015 1504   APPEARANCEUR CLEAR 08/09/2015 1504   LABSPEC <=1.005 (A) 08/09/2015 1504   PHURINE 6.0 08/09/2015 1504   GLUCOSEU NEGATIVE 08/09/2015 1504   HGBUR NEGATIVE 08/09/2015 1504   BILIRUBINUR NEGATIVE 08/09/2015 1504   KETONESUR NEGATIVE 08/09/2015 1504   UROBILINOGEN 0.2 08/09/2015 1504   NITRITE NEGATIVE 08/09/2015 1504   LEUKOCYTESUR NEGATIVE 08/09/2015 1504   Sepsis Labs: '@LABRCNTIP'$ (procalcitonin:4,lacticidven:4)  ) Recent Results (from the past 240 hour(s))  MRSA PCR Screening     Status: None     Collection Time: 08/05/16  8:24 AM  Result Value Ref Range Status   MRSA by PCR NEGATIVE NEGATIVE Final    Comment:        The GeneXpert MRSA Assay (FDA approved for NASAL specimens only), is one component of a comprehensive MRSA colonization surveillance program. It is not intended to diagnose MRSA infection nor to guide or monitor treatment for MRSA infections.      Radiology Studies: Dg Chest 2 View  Result Date: 08/04/2016 CLINICAL DATA:  Shortness of breath for 72 hours EXAM: CHEST  2 VIEW COMPARISON:  07/26/2016 FINDINGS: There is bilateral hyperinflation. There is no focal pulmonary infiltrate, consolidation, or pleural effusion. The cardiomediastinal silhouette is stable. There is atherosclerosis. There is no pneumothorax. IMPRESSION: Hyperinflation.  No acute pulmonary infiltrate. Electronically Signed   By: Donavan Foil M.D.   On: 08/04/2016 22:39  Scheduled Meds: . aspirin EC  81 mg Oral Daily  . atorvastatin  20 mg Oral Daily  . azithromycin  500 mg Intravenous QHS  . budesonide (PULMICORT) nebulizer solution  0.25 mg Nebulization BID  . dextromethorphan-guaiFENesin  2 tablet Oral BID  . donepezil  10 mg Oral QHS  . enoxaparin (LOVENOX) injection  40 mg Subcutaneous Q24H  . escitalopram  20 mg Oral Daily  . feeding supplement (ENSURE ENLIVE)  237 mL Oral BID BM  . ipratropium-albuterol  3 mL Nebulization QID  . loratadine  10 mg Oral Daily  . methylPREDNISolone (SOLU-MEDROL) injection  60 mg Intravenous BID  . montelukast  10 mg Oral QHS   Continuous Infusions:   LOS: 0 days   Time spent: 25 minutes.  Vance Gather, MD [facility] Pager [phone]  If 7PM-7AM, please contact night-coverage [email or web address] Password [email or web address] 08/06/2016, 9:20 AM

## 2016-08-06 NOTE — Progress Notes (Signed)
Initial Nutrition Assessment  DOCUMENTATION CODES:   Severe malnutrition in context of acute illness/injury  INTERVENTION:  - Continue Ensure Enlive BID, each supplement provides 350 kcal and 20 grams of protein. - Diet liberalized from Heart Healthy to Regular following discussion with Dr. Bonner Puna - Continue to encourage PO intakes of meals and supplements.  - RD will continue to monitor for additional nutrition-related needs.  NUTRITION DIAGNOSIS:   Increased nutrient needs related to chronic illness (COPD) as evidenced by estimated needs (for protein).  GOAL:   Patient will meet greater than or equal to 90% of their needs  MONITOR:   PO intake, Supplement acceptance, Weight trends, Labs, I & O's  REASON FOR ASSESSMENT:   Malnutrition Screening Tool  ASSESSMENT:   71 y.o. female with COPD, diastolic dysfunction, tobacco abuse and anxiety presents to the ER because of worsening shortness of breath. Patient was recently discharged home after being admitted for COPD exacerbation last week. Patient's shortness of breath started yesterday morning. Patient's daughter states that she was doing fine the night before. Since they were not able to reach the patient over the phone they went to visit the patient yesterday morning and found patient was short of breath and confused. Patient was brought to the ER and was placed on BiPAP and steroids, nebulizer treatment. Chest x-ray does not show any infiltrates. Patient is able to talk while being on the BiPAP. Patient is being admitted for acute respiratory failure with hypoxia and hypercarbia secondary to COPD exacerbation. Denies any chest pain fever chills. Has been having some productive cough.  Pt seen for MST. BMI indicates normal weight. No intakes documented since admission. Pt states that she has mainly been consuming mashed potatoes with gravy, corn, and grapes since admission as she has not liked some of the other foods that she is able  to have on Heart Healthy diet and also because of poor appetite. PTA she had a poor appetite which has been ongoing for several months. She states that this began d/t stress in her life and she is hopeful that once the cause of this stress has been resolved that appetite will return to baseline. At home she has been consuming 4 small meals/day as eating too much at one time causes gas pains and overall discomfort.  PTA she was drinking [employer] protein shakes. Ensure Enlive was ordered at time of admission and has not tried it PTA but is interested in purchasing for home after d/c. She denies any chewing or swallowing issues. She denies abdominal pain or nausea with eating as long as she does not eat large quantities in one sitting.   Physical assessment shows moderate muscle and mild to moderate fat wasting. Per chart review, pt has lost 6 lbs (5.7% body weight) sine 07/26/16; this is significant for time frame. Will continue to monitor weight trends.   Medications reviewed; PRN Zofran, 50 mg Deltasone/day.  Labs reviewed; CBGs: 125-168 mg/dL today, Cl: 98 mmol/L, BUN: 37 mg/dL, Ca: 8.4 mg/dL.     Diet Order:  Diet Heart Room service appropriate? Yes; Fluid consistency: Thin  Skin:  Reviewed, no issues  Last BM:  1/9  Height:   Ht Readings from Last 1 Encounters:  08/05/16 '5\' 1"'$  (1.549 m)    Weight:   Wt Readings from Last 1 Encounters:  08/06/16 100 lb 1.4 oz (45.4 kg)    Ideal Body Weight:  47.73 kg  BMI:  Body mass index is 18.91 kg/m.  Estimated Nutritional Needs:  Kcal:  1270-1450 (28-32 kcal/kg)  Protein:  54-64 grams (1.2-1.4 grams/kg)  Fluid:  1.4-1.6 L/day  EDUCATION NEEDS:   No education needs identified at this time    Jarome Matin, MS, RD, LDN, CNSC Inpatient Clinical Dietitian Pager # [phone] After hours/weekend pager # [phone]

## 2016-08-07 LAB — GLUCOSE, CAPILLARY
GLUCOSE-CAPILLARY: 99 mg/dL (ref 65–99)
Glucose-Capillary: 81 mg/dL (ref 65–99)
Glucose-Capillary: 68 mg/dL (ref 65–99)
Glucose-Capillary: 147 mg/dL — ABNORMAL HIGH (ref 65–99)

## 2016-08-07 MED ORDER — PREDNISONE 20 MG PO TABS: ORAL_TABLET | ORAL | 0 refills | Status: DC

## 2016-08-07 NOTE — Consult Note (Signed)
   [facility] CM Inpatient Consult   08/07/2016  KUMIKO FISHMAN 04-21-1946 [id number]     Came to speak with Ms. Wooton at bedside to discuss COPD transition calls. She is agreeable to [facility] COPD calls and brochure provided as well. She states she does need [place] follow up at this time but will be agreeable for EMMI calls for COPD. Will make inpatient RNCM aware.    Marthenia Rolling, MSN-Ed, RN,BSN [facility] Liaison [phone]

## 2016-08-07 NOTE — Progress Notes (Signed)
SATURATION QUALIFICATIONS: (This note is used to comply with regulatory documentation for home oxygen)  Patient Saturations on Room Air at Rest = 94%  Patient Saturations on Room Air while Ambulating = 89%  Please briefly explain why patient needs home oxygen:

## 2016-08-07 NOTE — Discharge Summary (Signed)
Physician Discharge Summary  Michele Martin NAT:[id number] DOB: [birth year] DOA: 08/04/2016  PCP: Cathlean Cower, MD  Admit date: 08/04/2016 Discharge date: 08/09/2016  Admitted From: Home Disposition: Home   Recommendations for Outpatient Follow-up:  1. Follow up with PCP in 1-2 weeks 2. Please refer for outpatient counseling/psychiatry for treatment of anxiety/grief reaction as below. Discharged to continue lexapro and clonazepam. 3. TSH 0.104. Recommend recheck TFTs in ~6 weeks to confirm true abnormal and not attributable to acute illness.   Home Health: None Equipment/Devices: None, did not qualify for oxygen  Discharge Condition: Stable CODE STATUS: Full Diet recommendation: Regular - would be heart healthy due to heart failure, but patient is severely malnourished. Advised to limit salt, but otherwise ad lib.   Brief/Interim Summary: Michele Vannostrand Abbatepaolois a 71 y.o.femalewith COPD, diastolic dysfunction, tobacco abuse and anxiety presented to the ER because of worsening shortness of breath. Patient was recently discharged home after being admitted for COPD exacerbation last week after improvement, restarted acutely day PTA. Family went to visit the patient yesterday morning and found patient was short of breath and confused. Patient was brought to the ER and was placed on BiPAP and steroids, nebulizer treatment. Chest x-ray does not show any infiltrates. Patient was admitted for acute respiratory failure with hypoxia and hypercarbia secondary to COPD exacerbation. BiPAP was rapidly weaned to 2L [place] and eventually to room air. It was felt that anxiety has played a role in her recent worsening respiratory function since her daughter's death in April 21, 2023 and in her current moment-to-moment subjective feeling of dyspnea regardless of hypoxemia. Clonazepam was found to be very helpful in this regard. She was discharged in stable condition but will continue prolonged steroid taper as she still has  some wheezing. She's completed 3 days of antibiotics for possible pneumonia.   Discharge Diagnoses:  Principal Problem:   Acute respiratory failure with hypoxia and hypercapnia (HCC) Active Problems:   Anxiety state   COPD exacerbation (HCC)   COPD (chronic obstructive pulmonary disease) (HCC)   Protein-calorie malnutrition, severe  Acute on chronic respiratory failure with hypoxia and hypercarbia secondary to COPD exacerbation: Wears nocturnal oxygen at home PTA and will be discharged on the same. - Continue steroids, with recent use, will err on the side of a longer taper: Prednisone '60mg'$  x3 days > '40mg'$  x3 days > '20mg'$  x3 days > '10mg'$  daily until follow up - Continue home bronchodilators - s/p azithromycin '500mg'$  x3 days - Ambulated with pulse oximetry today: Did not qualify for continuous oxygen. - Treat anxiety as below  Anxiety/grief reaction: Exacerbated by daughter's unexpected passing in 04/20/16. - Suspect this is significant driver of dyspnea. Will continue Klonopinand Lexapro. - Recommend outpatient follow up.   Diastolic dysfunctionper 2-D echo on 07/27/2016 with EF of 60-65% with grade 1 diastolic dysfunction. Euvolemic. - SLIV  Tobacco abuse: - Brief cessation counseling provided  Hyperlipidemia: Chronic, stable.  - Continue statin  Discharge Instructions Discharge Instructions    AMB Referral to [facility] Care Management    Complete by:  As directed    EMMI COPD only.   Reason for consult:  EMMI COPD ONLY   Diagnoses of:  COPD/ Pneumonia   Expected date of contact:  1-3 days (reserved for hospital discharges)   Discharge instructions    Complete by:  As directed    You were admitted due to shortness of breath. This required BiPAP and continues to require the use of oxygen. You should continue using oxygen at home  at least until you follow up with your PCP. You are stable for discharge with the following recommendations:  - Continue to take prednisone as follows:  Prednisone '60mg'$  x3 days > '40mg'$  x3 days > '20mg'$  x3 days > '10mg'$  daily until follow up. This prescription has been printed and will be provided to you at discharge.  - Continue using your home inhalers. Use albuterol around the clock every 6 hours for 2 days and then just as needed for trouble breathing or wheezing. - Schedule a follow up appointment with your PCP as soon as possible. - Continue taking klonopin up to three times a day as needed for anxiety. I believe this will help with your breathing as well.  - If your symptoms return, you need to return for medical care right away.   Increase activity slowly    Complete by:  As directed      Allergies as of 08/07/2016      Reactions   Pravastatin Itching, Other (See Comments)   Myalgias/cramping   Simvastatin Itching, Other (See Comments)   Myalgias/cramping   Statins Itching, Other (See Comments)   Myalgias/cramping      Medication List    STOP taking these medications   hydrOXYzine 10 MG tablet Commonly known as:  ATARAX/VISTARIL   levofloxacin 500 MG tablet Commonly known as:  LEVAQUIN     TAKE these medications   albuterol (2.5 MG/3ML) 0.083% nebulizer solution Commonly known as:  PROVENTIL Take 3 mLs (2.5 mg total) by nebulization every 6 (six) hours as needed for wheezing or shortness of breath.   albuterol 108 (90 Base) MCG/ACT inhaler Commonly known as:  PROVENTIL HFA Inhale 2 puffs into the lungs every 6 (six) hours as needed for wheezing or shortness of breath.   aspirin 81 MG EC tablet Take 81 mg by mouth daily.   atorvastatin 20 MG tablet Commonly known as:  LIPITOR TAKE 1 TABLET BY MOUTH ONCE DAILY What changed:  See the new instructions.   budesonide-formoterol 160-4.5 MCG/ACT inhaler Commonly known as:  SYMBICORT Inhale 2 puffs into the lungs 2 (two) times daily.   clonazePAM 0.5 MG tablet Commonly known as:  KLONOPIN Take 1 tablet (0.5 mg total) by mouth 3 (three) times daily as needed for anxiety.    dextromethorphan-guaiFENesin 30-600 MG 12hr tablet Commonly known as:  MUCINEX DM Take 2 tablets by mouth 2 (two) times daily.   diphenhydrAMINE 25 mg capsule Commonly known as:  BENADRYL Take 25 mg by mouth every 6 (six) hours as needed for allergies.   donepezil 10 MG tablet Commonly known as:  ARICEPT Take 1 tablet daily What changed:  how much to take  how to take this  when to take this  additional instructions   escitalopram 20 MG tablet Commonly known as:  LEXAPRO TAKE 1 TABLET BY MOUTH ONCE DAILY What changed:  See the new instructions.   montelukast 10 MG tablet Commonly known as:  SINGULAIR TAKE 1 TABLET BY MOUTH EVERY NIGHT AT BEDTIME What changed:  See the new instructions.   predniSONE 20 MG tablet Commonly known as:  DELTASONE Take 3 tablets by mouth daily X 3 days; then 2 tablets by mouth daily X 3 days; then 1 tablet by mouth daily X 3 days; then start using '10mg'$  (1/2) by mouth daily.   tiotropium 18 MCG inhalation capsule Commonly known as:  SPIRIVA HANDIHALER Place 1 capsule (18 mcg total) into inhaler and inhale daily.  Allergies  Allergen Reactions  . Pravastatin Itching and Other (See Comments)    Myalgias/cramping  . Simvastatin Itching and Other (See Comments)    Myalgias/cramping  . Statins Itching and Other (See Comments)    Myalgias/cramping    Consultations:  None  Procedures/Studies: Dg Chest 2 View  Result Date: 08/04/2016 CLINICAL DATA:  Shortness of breath for 72 hours EXAM: CHEST  2 VIEW COMPARISON:  07/26/2016 FINDINGS: There is bilateral hyperinflation. There is no focal pulmonary infiltrate, consolidation, or pleural effusion. The cardiomediastinal silhouette is stable. There is atherosclerosis. There is no pneumothorax. IMPRESSION: Hyperinflation.  No acute pulmonary infiltrate. Electronically Signed   By: Donavan Foil M.D.   On: 08/04/2016 22:39   Dg Chest 2 View  Result Date: 07/26/2016 CLINICAL DATA:  Pt c/o  continuing chest pain. Pt sent from her PCP due to COPD exacerbation. Pt was [facility] 2 days ago and left AMA. Pt still SOB and reports heart is pounding. Pt had home albuterol treatment this morning. EXAM: CHEST  2 VIEW COMPARISON:  07/24/2016 FINDINGS: Lungs are hyperinflated. There is perihilar peribronchial thickening. There are no focal consolidations or pleural effusions. No pulmonary edema. Status post cervical fusion. IMPRESSION: 1. Hyperinflation and bronchitic changes. 2.  No focal acute pulmonary abnormality. Electronically Signed   By: Nolon Nations M.D.   On: 07/26/2016 12:09   Dg Chest 2 View  Result Date: 07/24/2016 CLINICAL DATA:  Shortness of breath and cardiac arrhythmia EXAM: CHEST  2 VIEW COMPARISON:  July 16, 2016 FINDINGS: There is scarring in the medial left upper lobe region. There is no edema or consolidation. Heart size and pulmonary vascularity are normal. There is atherosclerotic calcification in the aorta. No adenopathy. There is thoracolumbar levoscoliosis. There is postoperative change in the lower cervical spine. IMPRESSION: Scarring left upper lobe. No edema or consolidation. Aortic atherosclerosis. Electronically Signed   By: Lowella Grip III M.D.   On: 07/24/2016 14:57   Dg Chest 2 View  Result Date: 07/16/2016 CLINICAL DATA:  Dyspnea EXAM: CHEST  2 VIEW COMPARISON:  10/03/2015 FINDINGS: Cardiac shadow is within normal limits. Aortic calcifications are seen. Lungs are well aerated bilaterally. No focal infiltrate or sizable effusion is seen. No acute bony abnormality is noted. Stable postsurgical changes in the cervical spine are seen. IMPRESSION: No active cardiopulmonary disease. Electronically Signed   By: Inez Catalina M.D.   On: 07/16/2016 10:26    Subjective: Pt reports breathing easier, still wheezing but reports this is at baseline. Says she is going home no matter what.   Discharge Exam: Vitals:   08/07/16 0503 08/07/16 1400  BP: 120/81  (!) 100/51  Pulse: 70 64  Resp: 17 18  Temp: 98.8 F (37.1 C) 99.2 F (37.3 C)   Vitals:   08/07/16 0503 08/07/16 0920 08/07/16 1400 08/07/16 1426  BP: 120/81  (!) 100/51   Pulse: 70  64   Resp: 17  18   Temp: 98.8 F (37.1 C)  99.2 F (37.3 C)   TempSrc: Oral  Oral   SpO2: 97% 98% 94% 91%  Weight:      Height:       General: Elderly, thin female in no distress Cardiovascular: RRR, S1/S2 +, no rubs, no gallops Respiratory: Nonlabored on 1L by [place], scattered expiratory wheezing without rhonchi or crackles Abdominal: Soft, NT, ND, bowel sounds + Extremities: no edema, no cyanosis  The results of significant diagnostics from this hospitalization (including imaging, microbiology, ancillary and laboratory) are  listed below for reference.    Microbiology: Recent Results (from the past 240 hour(s))  MRSA PCR Screening     Status: None   Collection Time: 08/05/16  8:24 AM  Result Value Ref Range Status   MRSA by PCR NEGATIVE NEGATIVE Final    Comment:        The GeneXpert MRSA Assay (FDA approved for NASAL specimens only), is one component of a comprehensive MRSA colonization surveillance program. It is not intended to diagnose MRSA infection nor to guide or monitor treatment for MRSA infections.      Labs: BNP (last 3 results)  Recent Labs  07/24/16 1933 07/26/16 1225 08/04/16 2242  BNP 40.6 44.2 37.8   Basic Metabolic Panel:  Recent Labs Lab 08/04/16 2254 08/05/16 0315 08/05/16 0913 08/06/16 0400  NA 138 141 139 138  K 4.0 5.6* 4.0 4.1  CL 99* 95* 98* 98*  CO2  --  32 31 33*  GLUCOSE 128* 172* 156* 134*  BUN 29* 35* 34* 37*  CREATININE 0.70 0.86 0.60 0.52  CALCIUM  --  9.5 8.5* 8.4*   Liver Function Tests:  Recent Labs Lab 08/05/16 0315  AST 21  ALT 25  ALKPHOS 53  BILITOT 0.7  PROT 7.0  ALBUMIN 4.1   No results for input(s): LIPASE, AMYLASE in the last 168 hours. No results for input(s): AMMONIA in the last 168 hours. CBC:  Recent  Labs Lab 08/04/16 2242 08/04/16 2254 08/05/16 0315 08/06/16 0400  WBC 13.6*  --  21.7* 15.1*  NEUTROABS 11.8*  --  21.2*  --   HGB 16.8* 17.7* 18.0* 15.0  HCT 49.8* 52.0* 54.4* 45.3  MCV 96.7  --  100.6* 97.6  PLT 182  --  213 181   Cardiac Enzymes:  Recent Labs Lab 08/05/16 0913  TROPONINI <0.03   BNP: Invalid input(s): POCBNP CBG:  Recent Labs Lab 08/06/16 2017 08/07/16 0025 08/07/16 0501 08/07/16 0805 08/07/16 1154  GLUCAP 98 99 81 68 147*   D-Dimer No results for input(s): DDIMER in the last 72 hours. Hgb A1c No results for input(s): HGBA1C in the last 72 hours. Lipid Profile No results for input(s): CHOL, HDL, LDLCALC, TRIG, CHOLHDL, LDLDIRECT in the last 72 hours. Thyroid function studies No results for input(s): TSH, T4TOTAL, T3FREE, THYROIDAB in the last 72 hours.  Invalid input(s): FREET3 Anemia work up No results for input(s): VITAMINB12, FOLATE, FERRITIN, TIBC, IRON, RETICCTPCT in the last 72 hours. Urinalysis    Component Value Date/Time   COLORURINE YELLOW 08/09/2015 1504   APPEARANCEUR CLEAR 08/09/2015 1504   LABSPEC <=1.005 (A) 08/09/2015 1504   PHURINE 6.0 08/09/2015 1504   GLUCOSEU NEGATIVE 08/09/2015 1504   HGBUR NEGATIVE 08/09/2015 1504   BILIRUBINUR NEGATIVE 08/09/2015 1504   KETONESUR NEGATIVE 08/09/2015 1504   UROBILINOGEN 0.2 08/09/2015 1504   NITRITE NEGATIVE 08/09/2015 1504   LEUKOCYTESUR NEGATIVE 08/09/2015 1504   Sepsis Labs Invalid input(s): PROCALCITONIN,  WBC,  LACTICIDVEN Microbiology Recent Results (from the past 240 hour(s))  MRSA PCR Screening     Status: None   Collection Time: 08/05/16  8:24 AM  Result Value Ref Range Status   MRSA by PCR NEGATIVE NEGATIVE Final    Comment:        The GeneXpert MRSA Assay (FDA approved for NASAL specimens only), is one component of a comprehensive MRSA colonization surveillance program. It is not intended to diagnose MRSA infection nor to guide or monitor treatment  for MRSA infections.  Time coordinating discharge: Over 30 minutes  Vance Gather, MD  [facility] 08/09/2016, 5:34 PM Pager [phone]  If 7PM-7AM, please contact night-coverage [email or web address] Password [email or web address]

## 2016-08-07 NOTE — Progress Notes (Signed)
Pt do not qualify for home O2. Sats 89-94%.

## 2016-08-13 ENCOUNTER — Other Ambulatory Visit: Payer: Self-pay

## 2016-08-13 ENCOUNTER — Inpatient Hospital Stay: Payer: Medicare Other | Admitting: Internal Medicine

## 2016-08-13 NOTE — Patient Outreach (Addendum)
[facility]) Care Management  08/13/2016  Michele Martin July 04, 1946 [id number]    Emmi COPD Program   Referral Date:  08/13/16 Source:  Emmi COPD  Issue:  Red Alert  [facility] eligible:  Yes Insurance:  [employer] Medicare        Michele Martin Tue 08/12/16  [phone] Day # 1  PATIENT COPD FOLLOW-UP AND MEDICATION  Time of Last Call Attempt 4:02 PM  Who reached Patient  Harder to breathe? Yes  More chest pain than yesterday? Yes  Coughing more than yesterday? Yes  Mucus change color? Yes  # of times rescue inhaler used in past 24 hours? 2  Emmi Program: COPD: ABOUT Issued  Emmi Program: PATIENT SATISFACTION: AFTER DISCHARGE EXPECTATIONS Issued    Providers: Primary MD:  Dr. Cathlean Cower  -  Last appt _____  Next appt:  Post hospital follow-up appt 12/22 rescheduled due to snow.   Subjective:  Patient reached and completed call.   [address]  [place] [address] [phone] (H) [phone] (M)  Psycho/Social: Patient lives alone in her home.  Falls:  ___________  Emergency Contact:  Sister who lives 5 minutes away.   DME:  Walker with front wheels, built in shower seat, Nebulizer.  H/o no oxygen needed at discharge 08/07/16; dentures, eyeglasses.  Advance Directives:  H/o Patient signed a Designated party release to allow her son, Michele Martin, daughter Michele Martin and granddaughter Michele Martin , to have access to her medical records/information.   Ful Code Home Health:  None  Co-morbidities  COPD Admission 08/04/2016 - 08/09/2016  Acute respiratory failure with hypoxia and hypercapnia  Active Problems:  Anxiety state, COPD exacerbation, COPD (chronic obstructive pulmonary disease), Protein-calorie malnutrition, severe  Patient confirms she is "ok" and symptoms are not an issue reported on the automation [employer].  BP 131/92 08/04/2016 Weight 101 lb (46 kg) 08/04/2016 Height 61 in (155 cm) 08/04/2016 BMI 19.00 (Normal) 08/06/2016  Lipid  Panel completed 08/09/2015 HDL 89.500 08/09/2015 LDL 114.000 08/09/2015 Cholesterol, total 222.000 08/09/2015 Triglycerides 95.000 08/09/2015 A1C 5.600 08/01/2011 Glucose Random 134.000 08/06/2016  Medications:  Patient confirms she has all medications ordered at discharge.  Prevnar (PCV13) 07/19/2013 Pneumovax (PP 06/27/2009 Flu Vaccine 05/15/2016 tDAP Vaccine 07/30/2009  Encounter Medications:  Outpatient Encounter Prescriptions as of 08/13/2016  Medication Sig  . albuterol (PROVENTIL HFA) 108 (90 BASE) MCG/ACT inhaler Inhale 2 puffs into the lungs every 6 (six) hours as needed for wheezing or shortness of breath.  [name] albuterol (PROVENTIL) (2.5 MG/3ML) 0.083% nebulizer solution Take 3 mLs (2.5 mg total) by nebulization every 6 (six) hours as needed for wheezing or shortness of breath.  [name] aspirin 81 MG EC tablet Take 81 mg by mouth daily.    [name] atorvastatin (LIPITOR) 20 MG tablet TAKE 1 TABLET BY MOUTH ONCE DAILY (Patient taking differently: TAKE '20mg'$  TABLET BY MOUTH ONCE in the evening)  . budesonide-formoterol (SYMBICORT) 160-4.5 MCG/ACT inhaler Inhale 2 puffs into the lungs 2 (two) times daily.  . clonazePAM (KLONOPIN) 0.5 MG tablet Take 1 tablet (0.5 mg total) by mouth 3 (three) times daily as needed for anxiety.  [name] dextromethorphan-guaiFENesin (MUCINEX DM) 30-600 MG 12hr tablet Take 2 tablets by mouth 2 (two) times daily.  . diphenhydrAMINE (BENADRYL) 25 mg capsule Take 25 mg by mouth every 6 (six) hours as needed for allergies.  [name] donepezil (ARICEPT) 10 MG tablet Take 1 tablet daily (Patient taking differently: Take 10 mg by mouth at bedtime. )  . escitalopram (  LEXAPRO) 20 MG tablet TAKE 1 TABLET BY MOUTH ONCE DAILY (Patient taking differently: TAKE '10mg'$  TABLET BY MOUTH ONCE DAILY)  . montelukast (SINGULAIR) 10 MG tablet TAKE 1 TABLET BY MOUTH EVERY NIGHT AT BEDTIME (Patient taking differently: TAKE '10mg'$  TABLET BY MOUTH EVERY NIGHT AT BEDTIME)  . predniSONE (DELTASONE) 20 MG tablet Take 3  tablets by mouth daily X 3 days; then 2 tablets by mouth daily X 3 days; then 1 tablet by mouth daily X 3 days; then start using '10mg'$  (1/2) by mouth daily.  [name] tiotropium (SPIRIVA HANDIHALER) 18 MCG inhalation capsule Place 1 capsule (18 mcg total) into inhaler and inhale daily.   No facility-administered encounter medications on file as of 08/13/2016.     Functional Status:  In your present state of health, do you have any difficulty performing the following activities: 08/05/2016 07/26/2016  Hearing? N N  Vision? N N  Difficulty concentrating or making decisions? N N  Walking or climbing stairs? Y Y  Dressing or bathing? N N  Doing errands, shopping? N N  Some recent data might be hidden    Fall/Depression Screening: PHQ 2/9 Scores 06/25/2016 08/14/2015 08/02/2014 11/10/2013 07/19/2013  PHQ - 2 Score 1 1 0 1 2   Preventives:  Colonoscopy 11/27/2015 Mammogram 07/11/2014  Plan:  Emmi COPD Program 08/13/16 - 08/13/16 Partial screening 08/13/16 [facility] Telephonic RN CM service date:  08/13/16 Program:  Transition of Care: (Short Term)  08/13/16 -   COPD RN CM confirmed emergency plan today if needed (due to snow day).  RN CM discussed anxiety and provided clarification that Clonazepam can be used three times a day if needed.  Patient confirms this medication is helping her anxiety.  Discussed anxiety can be worsened by shortness of breath.   RN CM reviewed Discharger orders: -Follow up with PCP in 1-2 weeks -  (appt was scheduled for today but had to cancel due to snow.  Office to call patient back to reschedule once back in the office.  -Please refer for outpatient counseling/psychiatry for treatment of anxiety/grief reaction. (RN CM will continue to follow -Discharged to continue lexapro and clonazepam. (RN CM will continue to monitor PCP follow-up and Referral process) -TSH 0.104. Recommend recheck TFTs in ~6 weeks to confirm true abnormal and not attributable to acute illness.  RN CM will follow  lab order adherence. .[id number]  Diet recommendation: Regular -  H/o would be heart healthy due to heart failure, but patient is severely malnourished. Advised to limit salt, but otherwise ad lib. -Activity:  Increase slowly   H/o [facility] eligible.  Patient would benefit form COPD education and nutritional education.  Patient agreed to services.  RN CM scheduled for contact within one week for  Completion of Screening and Initial Assessment.   RN CM advised to please notify MD of any changes in condition prior to scheduled appt's.   RN CM provided contact name and # [phone] office, [phone] cell or main office # [phone] and 24-hour nurse line # [phone]. RN CM confirmed patient is aware of 911 services for urgent emergency needs.  RN CM sent successful outreach letter and [facility] Introductory package. RN CM sent Physician Enrollment/Barriers Letter (RN CM will route Initial Assessment to Primary MD once completed on future contact call).  RN CM notified [place] Management Assistant: agreed to services/case opened.  Nathaneil Canary, BSN, RN, [place] Care Management Care Management Coordinator [phone] Direct [phone] Cell [phone] Office [phone] Fax [email or web address]

## 2016-08-18 ENCOUNTER — Ambulatory Visit: Payer: Self-pay

## 2016-08-20 ENCOUNTER — Encounter: Payer: Self-pay | Admitting: Internal Medicine

## 2016-08-20 ENCOUNTER — Ambulatory Visit: Payer: Medicare Other

## 2016-08-20 ENCOUNTER — Ambulatory Visit (INDEPENDENT_AMBULATORY_CARE_PROVIDER_SITE_OTHER): Payer: Medicare Other | Admitting: Internal Medicine

## 2016-08-20 VITALS — BP 140/80 | HR 88 | Temp 98.3°F | Resp 20 | Wt 101.0 lb

## 2016-08-20 DIAGNOSIS — R531 Weakness: Secondary | ICD-10-CM

## 2016-08-20 DIAGNOSIS — F329 Major depressive disorder, single episode, unspecified: Secondary | ICD-10-CM

## 2016-08-20 DIAGNOSIS — J441 Chronic obstructive pulmonary disease with (acute) exacerbation: Secondary | ICD-10-CM

## 2016-08-20 DIAGNOSIS — F419 Anxiety disorder, unspecified: Secondary | ICD-10-CM

## 2016-08-20 DIAGNOSIS — F418 Other specified anxiety disorders: Secondary | ICD-10-CM

## 2016-08-20 MED ORDER — ESCITALOPRAM OXALATE 20 MG PO TABS: 10.0000 mg | ORAL_TABLET | Freq: Every day | ORAL | 3 refills | Status: DC

## 2016-08-20 MED ORDER — PREDNISONE 20 MG PO TABS: ORAL_TABLET | ORAL | 0 refills | Status: DC

## 2016-08-20 MED ORDER — CLONAZEPAM 0.5 MG PO TABS: 0.5000 mg | ORAL_TABLET | Freq: Three times a day (TID) | ORAL | 5 refills | Status: DC | PRN

## 2016-08-20 MED ORDER — CLONAZEPAM 0.5 MG PO TABS: 0.5000 mg | ORAL_TABLET | Freq: Two times a day (BID) | ORAL | 5 refills | Status: DC | PRN

## 2016-08-20 NOTE — Progress Notes (Signed)
Subjective:    Patient ID: Michele Martin, female    DOB: [birth year], 71 y.o.   MRN: [id number]  HPI  Here to f/u recent hospn with copd exacerbation 1/8-13, States "starting to wheeze again" after successful tx and d/c home with levaquin and prednisone now finished.  Pt denies chest pain, orthopnea, PND, increased LE swelling, palpitations, dizziness or syncope.  Did not require home o2 at d/c.  Pt denies new neurological symptoms such as new headache, or facial or extremity weakness or numbness   Pt denies polydipsia, polyuria.  Has worsening anxiety/depression on current meds but denies SI or HI, declines counseling referral, prefers church attendance. C/o general weakness, just not bouncing back as she has before, but no falls.  OK for ADLs at home but has not been able to drive. Past Medical History:  Diagnosis Date  . Acute angle-closure glaucoma 02/14/2010  . ALLERGIC RHINITIS 09/19/2009  . ANXIETY 04/15/2010  . Arthritis   . ASTHMA 09/19/2009  . BACK PAIN 02/14/2010  . Chronic bronchitis 04/14/2011  . CHRONIC OBSTRUCTIVE PULMONARY DISEASE, ACUTE EXACERBATION 12/19/2009  . Complication of anesthesia   . COPD 06/27/2009  . DEPRESSION 09/19/2009  . HOARSENESS 10/17/2009  . HYPERLIPIDEMIA 09/19/2009  . MENOPAUSAL DISORDER 08/20/2010  . OSTEOPENIA 09/10/2010  . Osteoporosis, unspecified 04/20/2014  . Rotator cuff tear 06/23/2011  . TINNITUS 09/10/2010  . TREMOR 02/14/2010  . Tremor 07/30/2011  . URI 09/10/2010  . Wheezing 06/27/2009   Past Surgical History:  Procedure Laterality Date  . ABDOMINAL HYSTERECTOMY    . cervical fusion 2013 - Dr Vertell Limber    . Normal Heart Cath  2001   Dr. Johnsie Cancel  . s/p laser tx for glaucoma     no vision loss    reports that she has quit smoking. Her smoking use included Cigarettes. She started smoking about 13 months ago. She has a 40.00 pack-year smoking history. She has never used smokeless tobacco. She reports that she does not drink alcohol or use  drugs. family history includes Dementia in her mother; Rheum arthritis in her maternal uncle. Allergies  Allergen Reactions  . Pravastatin Itching and Other (See Comments)    Myalgias/cramping  . Simvastatin Itching and Other (See Comments)    Myalgias/cramping  . Statins Itching and Other (See Comments)    Myalgias/cramping   Current Outpatient Prescriptions on File Prior to Visit  Medication Sig Dispense Refill  . albuterol (PROVENTIL HFA) 108 (90 BASE) MCG/ACT inhaler Inhale 2 puffs into the lungs every 6 (six) hours as needed for wheezing or shortness of breath. 18 g 5  . albuterol (PROVENTIL) (2.5 MG/3ML) 0.083% nebulizer solution Take 3 mLs (2.5 mg total) by nebulization every 6 (six) hours as needed for wheezing or shortness of breath. 150 mL 5  . aspirin 81 MG EC tablet Take 81 mg by mouth daily.      [name] atorvastatin (LIPITOR) 20 MG tablet TAKE 1 TABLET BY MOUTH ONCE DAILY (Patient taking differently: TAKE '20mg'$  TABLET BY MOUTH ONCE in the evening) 90 tablet 3  . budesonide-formoterol (SYMBICORT) 160-4.5 MCG/ACT inhaler Inhale 2 puffs into the lungs 2 (two) times daily. 3 Inhaler 3  . dextromethorphan-guaiFENesin (MUCINEX DM) 30-600 MG 12hr tablet Take 2 tablets by mouth 2 (two) times daily. 60 tablet 0  . diphenhydrAMINE (BENADRYL) 25 mg capsule Take 25 mg by mouth every 6 (six) hours as needed for allergies.    [name] donepezil (ARICEPT) 10 MG tablet Take 1 tablet daily (Patient taking  differently: Take 10 mg by mouth at bedtime. ) 90 tablet 3  . montelukast (SINGULAIR) 10 MG tablet TAKE 1 TABLET BY MOUTH EVERY NIGHT AT BEDTIME (Patient taking differently: TAKE '10mg'$  TABLET BY MOUTH EVERY NIGHT AT BEDTIME) 30 tablet 11  . tiotropium (SPIRIVA HANDIHALER) 18 MCG inhalation capsule Place 1 capsule (18 mcg total) into inhaler and inhale daily. 90 capsule 3   No current facility-administered medications on file prior to visit.    Review of Systems  Constitutional: Negative for unusual  diaphoresis or night sweats HENT: Negative for ear swelling or discharge Eyes: Negative for worsening visual haziness  Respiratory: Negative for choking and stridor.   Gastrointestinal: Negative for distension or worsening eructation Genitourinary: Negative for retention or change in urine volume.  Musculoskeletal: Negative for other MSK pain or swelling Skin: Negative for color change and worsening wound Neurological: Negative for tremors and numbness other than noted  Psychiatric/Behavioral: Negative for decreased concentration or agitation other than above   All other system neg per pt    Objective:   Physical Exam BP 140/80   Pulse 88   Temp 98.3 F (36.8 C) (Oral)   Resp 20   Wt 101 lb (45.8 kg)   SpO2 91%   BMI 19.08 kg/m  VS noted, thin , frail, fatigued Constitutional: Pt appears in no apparent distress HENT: Head: NCAT.  Right Ear: External ear normal.  Left Ear: External ear normal.  Eyes: . Pupils are equal, round, and reactive to light. Conjunctivae and EOM are normal Neck: Normal range of motion. Neck supple.  Cardiovascular: Normal rate and regular rhythm.   Pulmonary/Chest: Effort normal and breath sounds decreased without rales, but with few scattered wheezing.  Neurological: Pt is alert. Not confused , motor grossly intact Skin: Skin is warm. No rash, no LE edema Psychiatric: Pt behavior is normal. No agitation.  No other new exam findings        Assessment & Plan:

## 2016-08-20 NOTE — Patient Instructions (Addendum)
Please take all new medication as prescribed   - the prednisone  You are given the handicap parking application signed  You should do no driving for 1 month  You will be contacted regarding the referral for: [place] and aide, as well as [facility] if you qualify  Please call if you change your mind referral to outpatient Physical Therapy, or counseling  Please continue all other medications as before, and refills have been done if requested - the  Nerve medications  Please have the pharmacy call with any other refills you may need.  Please keep your appointments with your specialists as you may have planned

## 2016-08-20 NOTE — Progress Notes (Signed)
Pre visit review using our clinic review tool, if applicable. No additional management support is needed unless otherwise documented below in the visit note. 

## 2016-08-21 ENCOUNTER — Other Ambulatory Visit: Payer: Self-pay

## 2016-08-21 NOTE — Patient Outreach (Addendum)
[facility]) Care Management  08/21/2016  Jalexia Lalli Mcelveen 25-Apr-1946 [id number]   [facility] COPD Program   [facility] COPD Program 08/13/16 - 08/13/16 Partial screening 08/13/16 [facility] Telephonic RN CM service date:  08/13/16 Program:  Transition of Care: (Short Term)  08/13/16 -  [facility] eligible:  Yes Insurance:  [employer] Medicare   (verified by RN CM 08/21/2016)  Subjective:  Patient reached and completed call.   [place]  [address] [place] [address] [phone] [name] (M)  Providers: Primary MD:  Dr. Cathlean Cower  -  Last appt: 08/20/2016  Psycho/Social: Patient lives alone in her home. C/o weakness in legs. MD recommended [facility] on 08/20/16 office visit.  Patient states she has thought about this and plans to call MD back to let him know she has decided to have [facility] PT services.  Patient denies any previous [place] services and has no preference in providers.   Falls: none Emergency Contact:  Sister who lives 5 minutes away.   Transportation:  Sister or granddaughter.  MD has asked patient not to drive right now.  Advance Directives:  H/o Patient signed a Designated party release to allow her son, Aylinn Rydberg, daughter [name] and granddaughter [name] , to have access to her medical records/information.   DME:  Walker with front wheels, built in shower seat, Nebulizer.  H/o no oxygen needed at discharge 08/07/16; dentures, eyeglasses.   Co-morbidities:  HF, COPD, Anxiety, Protein-calorie malnutrition, severe Admission 08/04/2016 - 08/09/2016  Acute respiratory failure with hypoxia and hypercapnia  Patient completed PCP follow-up on 08/21/16 and MD ordered Prednisone 20 mg tab -  1 tab every day for  7 days.   Weight 100 lbs.  Patient states she will follow-up with MD in one month if she does not improve.   Medications:  Prevnar (PCV13) 07/19/2013 Pneumovax (PP 06/27/2009 Flu Vaccine 05/15/2016 tDAP Vaccine 07/30/2009  Encounter Medications:  Outpatient Encounter  Prescriptions as of 08/21/2016  Medication Sig  . albuterol (PROVENTIL HFA) 108 (90 BASE) MCG/ACT inhaler Inhale 2 puffs into the lungs every 6 (six) hours as needed for wheezing or shortness of breath.  [name] albuterol (PROVENTIL) (2.5 MG/3ML) 0.083% nebulizer solution Take 3 mLs (2.5 mg total) by nebulization every 6 (six) hours as needed for wheezing or shortness of breath.  [name] aspirin 81 MG EC tablet Take 81 mg by mouth daily.    [name] atorvastatin (LIPITOR) 20 MG tablet TAKE 1 TABLET BY MOUTH ONCE DAILY (Patient taking differently: TAKE 24m TABLET BY MOUTH ONCE in the evening)  . budesonide-formoterol (SYMBICORT) 160-4.5 MCG/ACT inhaler Inhale 2 puffs into the lungs 2 (two) times daily.  . clonazePAM (KLONOPIN) 0.5 MG tablet Take 1 tablet (0.5 mg total) by mouth 2 (two) times daily as needed for anxiety.  .Marland Kitchendextromethorphan-guaiFENesin (MUCINEX DM) 30-600 MG 12hr tablet Take 2 tablets by mouth 2 (two) times daily.  . diphenhydrAMINE (BENADRYL) 25 mg capsule Take 25 mg by mouth every 6 (six) hours as needed for allergies.  .Marland Kitchendonepezil (ARICEPT) 10 MG tablet Take 1 tablet daily (Patient taking differently: Take 10 mg by mouth at bedtime. )  . escitalopram (LEXAPRO) 20 MG tablet Take 0.5 tablets (10 mg total) by mouth daily.  . montelukast (SINGULAIR) 10 MG tablet TAKE 1 TABLET BY MOUTH EVERY NIGHT AT BEDTIME (Patient taking differently: TAKE 147mTABLET BY MOUTH EVERY NIGHT AT BEDTIME)  . predniSONE (DELTASONE) 20 MG tablet Take 1 tab by mouth for 7 days  . tiotropium (  SPIRIVA HANDIHALER) 18 MCG inhalation capsule Place 1 capsule (18 mcg total) into inhaler and inhale daily.   No facility-administered encounter medications on file as of 08/21/2016.     Functional Status:  In your present state of health, do you have any difficulty performing the following activities: 08/21/2016 08/05/2016  Hearing? N N  Vision? N N  Difficulty concentrating or making decisions? N N  Walking or climbing stairs? Y Y   Dressing or bathing? N N  Doing errands, shopping? N N  Preparing Food and eating ? N -  Using the Toilet? N -  In the past six months, have you accidently leaked urine? N -  Do you have problems with loss of bowel control? N -  Managing your Medications? N -  Managing your Finances? N -  Housekeeping or managing your Housekeeping? N -  Some recent data might be hidden    Fall/Depression Screening: PHQ 2/9 Scores 08/21/2016 06/25/2016 08/14/2015 08/02/2014 11/10/2013 07/19/2013  PHQ - 2 Score 0 1 1 0 1 2    Fall Risk  08/21/2016 06/25/2016 04/16/2016 08/10/2015 08/02/2014  Falls in the past year? No No Yes Yes No  Number falls in past yr: - - 1 2 or more -  Injury with Fall? - - No No -  Risk Factor Category  - - - High Fall Risk -  Risk for fall due to : Impaired mobility - - - -  Follow up - - - Falls evaluation completed -    Preventives:  Eye exam:  Unable to recall last date Hearing screening / issues:  none Colonoscopy 11/27/2015 Mammogram 07/11/2014  Plan:  [facility] COPD Program 08/13/16 - 08/13/16 Partial screening 08/13/16 and completed on 08/21/16 [facility] Telephonic RN CM service date:  08/13/16 Transition of Care: (Short Term)  08/13/16 -  Program:  COPD 08/13/16  COPD RN CM encouraged to take Prednisone as directed.  RN CM encouraged to notify MD of decision to start [facility] PT services to address leg weakness.  RN CM discussed fall prevention and advised to use walker during this time.   MD Communication RN CM will notify PCP of patients decision for [facility] PT services.   [facility]i Educational Materials (mailed 08/21/2016) -COPD:  Medication  -COPD:  When To Get Help -Inhalers:  Using Them The Right Way -Your Lungs -COPD Action Plan Magnet  RN CM scheduled for next contact call within one week for continued Transition of Care Services and in one mont for telephonic monthly assessment.   RN CM advised to please notify MD of any changes in condition prior to scheduled appt's.   RN CM  provided contact name and # [phone] office, [phone] cell or main office # [phone] and 24-hour nurse line # [phone]. RN CM confirmed patient is aware of 911 services for urgent emergency needs.  [facility] Introductory package sent 08/13/16. Physician Enrollment/Barriers Letter sent 08/13/16 Completed Initial Assessment routed to Primary MD 08/21/16  [facility] CM Care Plan Problem One   Flowsheet Row Most Recent Value  Care Plan Problem One  Admission over the last 30 days for COPD management.   Role Documenting the Problem One  Care Management Telephonic [place] for Problem One  Active  [facility] Long Term Goal (31-90 days)  Patient will go 31 days without cause for readmission.   [facility] Long Term Goal Start Date  08/13/16  Interventions for Problem One Long Term Goal  RN CM will provide transition of care follow-up to  avoid readimission over the next 31 days.   [facility] CM Short Term Goal #1 (0-30 days)  Patient will use self management interventions, rescue inhalers, anxiety medicaions to manage symptoms over the next 30 days   [facility] CM Short Term Goal #1 Start Date  08/13/16  Interventions for Short Term Goal #1  RN CM will coach on disease managment over the next 30 days.   [facility] CM Short Term Goal #2 (0-30 days)  Patient will engage with COPD Action Plan education with RN CM over the next 30 days.   [facility] CM Short Term Goal #2 Start Date  08/13/16  [facility] CM Short Term Goal #2 Met Date  08/21/16  Interventions for Short Term Goal #2  RN CM will provide Action Plan discussion with patient over the next 30 days.   [facility] CM Short Term Goal #3 (0-30 days)  Patient will take anxiety medications as directed  to manage shortness of breath over the next 30 days.   [facility] CM Short Term Goal #3 Start Date  08/13/16  [facility] CM Short Term Goal #3 Met Date  08/21/16  Interventions for Short Tern Goal #3  RN CM will monitor adherence and clinical outcomes to anxiety medication management ove the next 30 days.      [facility] CM Care Plan Problem Two   Flowsheet Row Most Recent Value  Care Plan Problem Two  Transition of Care MD Adherence  Role Documenting the Problem Two  Care Management Telephonic Coordinator  Care Plan for Problem Two  Active  [facility] CM Short Term Goal #1 (0-30 days)  Patient will reschedule PCP follow-up appt cancelled due to snow day over the next 1-2 weeks.   [facility] CM Short Term Goal #1 Start Date  08/13/16  [facility] CM Short Term Goal #1 Met Date   08/21/16  Interventions for Short Term Goal #2   RN CM will monitor for adherence and provide coaching as needed over the next 30 days.     [facility] CM Care Plan Problem Three   Flowsheet Row Most Recent Value  Care Plan Problem Three  Advance Directive:  None   Role Documenting the Problem Three  Care Management Telephonic Coordinator  Care Plan for Problem Three  Active  [facility] Long Term Goal (31-90) days  Patient will complete Advance Directive over the next 31-90 days.   [facility] Long Term Goal Start Date  08/13/16  Interventions for Problem Three Long Term Goal  RN CM will monitor for education and guidance needs ove the next 31-90 days.   [facility] CM Short Term Goal #1 (0-30 days)  Patient will engage in Advance Directive discussion with RN CM over the next 30 days.   [facility] CM Short Term Goal #1 Start Date  08/13/16  Interventions for Short Term Goal #1  RN CM will initiat Advance Directive conversation with patient over the next 30 days.       Nathaneil Canary, BSN, RN, [place] [facility] Care Management Coordinator [phone] Direct [phone] Cell [phone] Office [phone] Fax [email or web address]

## 2016-08-21 NOTE — Patient Outreach (Signed)
[facility]) Care Management  08/21/2016  Michele Martin 08/10/1945 [id number]   Telephonic Care Coordination of Services  Fax sent to Dr. Cathlean Cower Patient C/O weakness in legs. MD recommended [facility] on 08/20/16 office visit.  Patient states she has thought about this and plans to call MD back to let MD know she has decided to have [facility] PT services.  Patient denies any previous [place] services and has no preference in provider.    Nathaneil Canary, BSN, RN, [place] [facility] Care Management Coordinator [phone] Direct [phone] Cell [phone] Office [phone] Fax [email or web address]

## 2016-08-22 ENCOUNTER — Telehealth: Payer: Self-pay | Admitting: Emergency Medicine

## 2016-08-22 NOTE — Telephone Encounter (Signed)
Ok this has been done - for [facility] with PT, RN and aide,  I also referred to [facility] to assist as well if needed and if she qualifies

## 2016-08-22 NOTE — Telephone Encounter (Signed)
Pt called and stated home health was discussed in her appt yesterday 1/25. She refused it then but thinks she might need it now. Please advise thanks.

## 2016-08-24 DIAGNOSIS — R0902 Hypoxemia: Secondary | ICD-10-CM | POA: Diagnosis not present

## 2016-08-24 DIAGNOSIS — J449 Chronic obstructive pulmonary disease, unspecified: Secondary | ICD-10-CM | POA: Diagnosis not present

## 2016-08-24 NOTE — Assessment & Plan Note (Signed)
Mild, for predpac asd,  to f/u any worsening symptoms or concerns

## 2016-08-24 NOTE — Assessment & Plan Note (Signed)
Mild worsening situational, declines any change in tx or counseling,  to f/u any worsening symptoms or concerns

## 2016-08-24 NOTE — Assessment & Plan Note (Addendum)
Declines HH PT for now but then changes her mind, ok for handicapped parking applic signed

## 2016-08-26 ENCOUNTER — Other Ambulatory Visit: Payer: Self-pay | Admitting: Internal Medicine

## 2016-08-27 ENCOUNTER — Other Ambulatory Visit: Payer: Self-pay

## 2016-08-27 DIAGNOSIS — Z9181 History of falling: Secondary | ICD-10-CM

## 2016-08-27 NOTE — Patient Outreach (Signed)
[facility] [facility]) Care Management  08/27/2016  Michele Martin [birth year] [id number]   Emmi COPD Program  Transition of Care: (Short Term)  08/13/16 - 08/27/16   Subjective:  Patient reached and completed call.   [place] [address] [phone] (M)  Providers: Primary MD:  Dr. Cathlean Cower  -  Last appt: 08/20/2016 HH:  Provider (patient unable to provide  Thursday and Friday PT Insurance:  [facility] Medicare   (verified by RN CM 08/21/2016)  Psycho/Social: Patient lives alone in her home. C/o weakness in legs. MD recommended [place] [facility] on 08/20/16 office visit.  Patient states she has thought about this and plans to call MD back to let him know she has decided to have [facility] PT services.  Patient denies any previous [place] services and has no preference in providers.  Fax sent to Dr. Cathlean Cower 08/21/16 of this update and MD has ordered [facility] PT services.  Falls: none Emergency Contact:  Sister who lives 5 minutes away.   Transportation:  Sister or granddaughter.  MD has asked patient not to drive right now.  Advance Directives:  H/o Patient signed a Designated party release to allow her son, Michele Martin, daughter Michele Martin and granddaughter Michele Martin , to have access to her medical records/information.   DME:  Walker with front wheels, built in shower seat, Nebulizer.  H/o no oxygen needed at discharge 08/07/16; dentures, eyeglasses.   Co-morbidities:  HF, COPD, Anxiety, Protein-calorie malnutrition, severe Admission 08/04/2016 - 08/09/2016  Acute respiratory failure with hypoxia and hypercapnia   COPD H/o Patient completed PCP follow-up on 08/21/16 and MD ordered Prednisone 20 mg tab -  1 tab every day for  7 days.   Nebulizer patient does not know how many times she can use the nebulizer and only uses when she is really symptomatic.  RN CM reviewed Medications and instructions with patient.   -Symbicort Inhaler  twice a day -Spiriva Inhaler daily  morning  -Albuterol Inhaler (using only every morning)  Advised may use every 6 hours -Nebulizer: Albuterol  (using only when symptoms are very bad).  RN CM advised may use Albuterol every 6 hours as needed.   Weight 100 lbs.  Ensure one daily.   Medications:  Prevnar (PCV13) 07/19/2013 Pneumovax (PP 06/27/2009 Flu Vaccine 05/15/2016 tDAP Vaccine 07/30/2009  Encounter Medications:  Outpatient Encounter Prescriptions as of 08/27/2016  Medication Sig  . albuterol (PROVENTIL HFA) 108 (90 BASE) MCG/ACT inhaler Inhale 2 puffs into the lungs every 6 (six) hours as needed for wheezing or shortness of breath.  [name] albuterol (PROVENTIL) (2.5 MG/3ML) 0.083% nebulizer solution Take 3 mLs (2.5 mg total) by nebulization every 6 (six) hours as needed for wheezing or shortness of breath.  [name] aspirin 81 MG EC tablet Take 81 mg by mouth daily.    [name] atorvastatin (LIPITOR) 20 MG tablet TAKE 1 TABLET BY MOUTH ONCE DAILY (Patient taking differently: TAKE '20mg'$  TABLET BY MOUTH ONCE in the evening)  . budesonide-formoterol (SYMBICORT) 160-4.5 MCG/ACT inhaler Inhale 2 puffs into the lungs 2 (two) times daily.  . clonazePAM (KLONOPIN) 0.5 MG tablet Take 1 tablet (0.5 mg total) by mouth 2 (two) times daily as needed for anxiety.  [name] dextromethorphan-guaiFENesin (MUCINEX DM) 30-600 MG 12hr tablet Take 2 tablets by mouth 2 (two) times daily.  . diphenhydrAMINE (BENADRYL) 25 mg capsule Take 25 mg by mouth every 6 (six) hours as needed for allergies.  [name] donepezil (ARICEPT) 10 MG tablet  Take 1 tablet daily (Patient taking differently: Take 10 mg by mouth at bedtime. )  . escitalopram (LEXAPRO) 20 MG tablet Take 0.5 tablets (10 mg total) by mouth daily.  . montelukast (SINGULAIR) 10 MG tablet TAKE 1 TABLET BY MOUTH EVERY NIGHT AT BEDTIME (Patient taking differently: TAKE '10mg'$  TABLET BY MOUTH EVERY NIGHT AT BEDTIME)  . predniSONE (DELTASONE) 20 MG tablet Take 1 tab by mouth for 7 days  . SYMBICORT 160-4.5 MCG/ACT inhaler INHALE 2  PUFFS BY MOUTH TWICE DAILY  . tiotropium (SPIRIVA HANDIHALER) 18 MCG inhalation capsule Place 1 capsule (18 mcg total) into inhaler and inhale daily.   No facility-administered encounter medications on file as of 08/27/2016.     Functional Status:  In your present state of health, do you have any difficulty performing the following activities: 08/21/2016 08/05/2016  Hearing? N N  Vision? N N  Difficulty concentrating or making decisions? N N  Walking or climbing stairs? Y Y  Dressing or bathing? N N  Doing errands, shopping? N N  Preparing Food and eating ? N -  Using the Toilet? N -  In the past six months, have you accidently leaked urine? N -  Do you have problems with loss of bowel control? N -  Managing your Medications? N -  Managing your Finances? N -  Housekeeping or managing your Housekeeping? N -  Some recent data might be hidden    Fall/Depression Screening: PHQ 2/9 Scores 08/21/2016 06/25/2016 08/14/2015 08/02/2014 11/10/2013 07/19/2013  PHQ - 2 Score 0 1 1 0 1 2    Fall Risk  08/21/2016 06/25/2016 04/16/2016 08/10/2015 08/02/2014  Falls in the past year? No No Yes Yes No  Number falls in past yr: - - 1 2 or more -  Injury with Fall? - - No No -  Risk Factor Category  - - - High Fall Risk -  Risk for fall due to : Impaired mobility - - - -  Follow up - - - Falls evaluation completed -    Preventives:  Eye exam:  Unable to recall last date Hearing screening / issues:  none Colonoscopy 11/27/2015 Mammogram 07/11/2014    Plan:  [facility] eligible:  Yes Emmi COPD Program 08/13/16 - 08/13/16 Screening and Initial Assessment 08/21/16 [facility] Telephonic RN CM service date:  08/13/16 Transition of Care: (Short Term)  08/13/16 - 08/27/2016 Program:  COPD 08/13/16 -   COPD RN CM encouraged to take Prednisone as directed and clarified inhalers and directions.   RN CM will follow weakness and fall risk and HH PT progress. RN CM discussed fall prevention and advised to use walker during this  time.  RN CM encouraged ensure as tolerated and increase caloric intake as much as possible.   Emmi Educational Materials (mailed 08/21/2016) -COPD:  Medication  -COPD:  When To Get Help -Inhalers:  Using Them The Right Way -Your Lungs -COPD Action Plan Magnet  RN CM scheduled for next contact call within one month for telephonic monthly assessment   RN CM advised to please notify MD of any changes in condition prior to scheduled appt's.   RN CM provided contact name and # [phone] office, [phone] cell or main office # [phone] and 24-hour nurse line # [phone]. RN CM confirmed patient is aware of 911 services for urgent emergency needs.  [facility] CM Care Plan Problem One   Flowsheet Row Most Recent Value  Care Plan Problem One  Admission over the last 30 days for COPD  management.   Role Documenting the Problem One  Care Management Telephonic [place] for Problem One  Active  THN Long Term Goal (31-90 days)  Patient will go 31 days without cause for readmission.   THN Long Term Goal Start Date  08/13/16  Interventions for Problem One Long Term Goal  RN CM will provide transition of care follow-up to avoid readimission over the next 31 days.   THN CM Short Term Goal #1 (0-30 days)  Patient will use self management interventions, rescue inhalers, anxiety medicaions to manage symptoms over the next 30 days   THN CM Short Term Goal #1 Start Date  08/13/16  Interventions for Short Term Goal #1  RN CM will coach on disease managment over the next 30 days.     [facility] CM Care Plan Problem Three   Flowsheet Row Most Recent Value  Care Plan Problem Three  Advance Directive:  None   Role Documenting the Problem Three  Care Management Telephonic Coordinator  Care Plan for Problem Three  Active  THN Long Term Goal (31-90) days  Patient will complete Advance Directive over the next 31-90 days.   THN Long Term Goal Start Date  08/27/16  Interventions for Problem Three Long  Term Goal  RN CM will monitor for education and guidance needs ove the next 31-90 days.   THN CM Short Term Goal #1 (0-30 days)  Patient will engage in Advance Directive discussion with RN CM over the next 30 days.   THN CM Short Term Goal #1 Start Date  08/27/16  Interventions for Short Term Goal #1  RN CM will initiat Advance Directive conversation with patient over the next 30 days.        Nathaneil Canary, BSN, RN, [place] Management Care Management Coordinator [phone] Direct [phone] Cell [phone] Office [phone] Fax Creedence [email or web address]

## 2016-08-27 NOTE — Telephone Encounter (Signed)
This encounter was created in error - please disregard.

## 2016-08-28 ENCOUNTER — Ambulatory Visit: Payer: Self-pay

## 2016-08-28 DIAGNOSIS — M6281 Muscle weakness (generalized): Secondary | ICD-10-CM | POA: Diagnosis not present

## 2016-08-28 DIAGNOSIS — Z9981 Dependence on supplemental oxygen: Secondary | ICD-10-CM | POA: Diagnosis not present

## 2016-08-28 DIAGNOSIS — J449 Chronic obstructive pulmonary disease, unspecified: Secondary | ICD-10-CM | POA: Diagnosis not present

## 2016-08-29 DIAGNOSIS — Z9981 Dependence on supplemental oxygen: Secondary | ICD-10-CM | POA: Diagnosis not present

## 2016-08-29 DIAGNOSIS — J449 Chronic obstructive pulmonary disease, unspecified: Secondary | ICD-10-CM | POA: Diagnosis not present

## 2016-08-29 DIAGNOSIS — M6281 Muscle weakness (generalized): Secondary | ICD-10-CM | POA: Diagnosis not present

## 2016-09-01 DIAGNOSIS — M6281 Muscle weakness (generalized): Secondary | ICD-10-CM | POA: Diagnosis not present

## 2016-09-01 DIAGNOSIS — Z9981 Dependence on supplemental oxygen: Secondary | ICD-10-CM | POA: Diagnosis not present

## 2016-09-01 DIAGNOSIS — J449 Chronic obstructive pulmonary disease, unspecified: Secondary | ICD-10-CM | POA: Diagnosis not present

## 2016-09-02 DIAGNOSIS — Z9981 Dependence on supplemental oxygen: Secondary | ICD-10-CM | POA: Diagnosis not present

## 2016-09-02 DIAGNOSIS — M6281 Muscle weakness (generalized): Secondary | ICD-10-CM | POA: Diagnosis not present

## 2016-09-02 DIAGNOSIS — J449 Chronic obstructive pulmonary disease, unspecified: Secondary | ICD-10-CM | POA: Diagnosis not present

## 2016-09-03 DIAGNOSIS — J449 Chronic obstructive pulmonary disease, unspecified: Secondary | ICD-10-CM | POA: Diagnosis not present

## 2016-09-03 DIAGNOSIS — Z9981 Dependence on supplemental oxygen: Secondary | ICD-10-CM | POA: Diagnosis not present

## 2016-09-03 DIAGNOSIS — M6281 Muscle weakness (generalized): Secondary | ICD-10-CM | POA: Diagnosis not present

## 2016-09-08 DIAGNOSIS — M6281 Muscle weakness (generalized): Secondary | ICD-10-CM | POA: Diagnosis not present

## 2016-09-08 DIAGNOSIS — J449 Chronic obstructive pulmonary disease, unspecified: Secondary | ICD-10-CM | POA: Diagnosis not present

## 2016-09-08 DIAGNOSIS — Z9981 Dependence on supplemental oxygen: Secondary | ICD-10-CM | POA: Diagnosis not present

## 2016-09-09 DIAGNOSIS — J449 Chronic obstructive pulmonary disease, unspecified: Secondary | ICD-10-CM | POA: Diagnosis not present

## 2016-09-09 DIAGNOSIS — Z9981 Dependence on supplemental oxygen: Secondary | ICD-10-CM | POA: Diagnosis not present

## 2016-09-09 DIAGNOSIS — M6281 Muscle weakness (generalized): Secondary | ICD-10-CM | POA: Diagnosis not present

## 2016-09-10 DIAGNOSIS — Z9981 Dependence on supplemental oxygen: Secondary | ICD-10-CM | POA: Diagnosis not present

## 2016-09-10 DIAGNOSIS — J449 Chronic obstructive pulmonary disease, unspecified: Secondary | ICD-10-CM | POA: Diagnosis not present

## 2016-09-10 DIAGNOSIS — M6281 Muscle weakness (generalized): Secondary | ICD-10-CM | POA: Diagnosis not present

## 2016-09-15 DIAGNOSIS — J449 Chronic obstructive pulmonary disease, unspecified: Secondary | ICD-10-CM | POA: Diagnosis not present

## 2016-09-15 DIAGNOSIS — Z9981 Dependence on supplemental oxygen: Secondary | ICD-10-CM | POA: Diagnosis not present

## 2016-09-15 DIAGNOSIS — M6281 Muscle weakness (generalized): Secondary | ICD-10-CM | POA: Diagnosis not present

## 2016-09-16 ENCOUNTER — Telehealth: Payer: Self-pay | Admitting: Internal Medicine

## 2016-09-16 DIAGNOSIS — Z9981 Dependence on supplemental oxygen: Secondary | ICD-10-CM | POA: Diagnosis not present

## 2016-09-16 DIAGNOSIS — M6281 Muscle weakness (generalized): Secondary | ICD-10-CM | POA: Diagnosis not present

## 2016-09-16 DIAGNOSIS — J449 Chronic obstructive pulmonary disease, unspecified: Secondary | ICD-10-CM | POA: Diagnosis not present

## 2016-09-16 NOTE — Telephone Encounter (Signed)
Routing to dr Jenny Reichmann, Juluis Rainier

## 2016-09-16 NOTE — Telephone Encounter (Signed)
Patients PAD screening showed right foot 0.15 severe and left foot 0.70 moderate.   Check patients once a year and will call in any findings.  Please follow back up with patient in regard.  Can call if there are any questions.  MD will get copy of report in 2-4 weeks.

## 2016-09-17 DIAGNOSIS — J449 Chronic obstructive pulmonary disease, unspecified: Secondary | ICD-10-CM | POA: Diagnosis not present

## 2016-09-17 DIAGNOSIS — Z9981 Dependence on supplemental oxygen: Secondary | ICD-10-CM | POA: Diagnosis not present

## 2016-09-17 DIAGNOSIS — M6281 Muscle weakness (generalized): Secondary | ICD-10-CM | POA: Diagnosis not present

## 2016-09-18 ENCOUNTER — Ambulatory Visit (INDEPENDENT_AMBULATORY_CARE_PROVIDER_SITE_OTHER): Payer: Medicare Other | Admitting: Internal Medicine

## 2016-09-18 ENCOUNTER — Ambulatory Visit: Payer: Medicare Other

## 2016-09-18 ENCOUNTER — Other Ambulatory Visit: Payer: Self-pay | Admitting: *Deleted

## 2016-09-18 ENCOUNTER — Encounter: Payer: Self-pay | Admitting: Internal Medicine

## 2016-09-18 VITALS — BP 112/86 | HR 74 | Temp 97.8°F | Ht 61.0 in | Wt 103.0 lb

## 2016-09-18 DIAGNOSIS — J449 Chronic obstructive pulmonary disease, unspecified: Secondary | ICD-10-CM

## 2016-09-18 DIAGNOSIS — I739 Peripheral vascular disease, unspecified: Secondary | ICD-10-CM

## 2016-09-18 DIAGNOSIS — R6889 Other general symptoms and signs: Secondary | ICD-10-CM | POA: Diagnosis not present

## 2016-09-18 DIAGNOSIS — J441 Chronic obstructive pulmonary disease with (acute) exacerbation: Secondary | ICD-10-CM

## 2016-09-18 DIAGNOSIS — E538 Deficiency of other specified B group vitamins: Secondary | ICD-10-CM | POA: Insufficient documentation

## 2016-09-18 MED ORDER — METHYLPREDNISOLONE ACETATE 80 MG/ML IJ SUSP
80.0000 mg | Freq: Once | INTRAMUSCULAR | Status: AC
  Administered 2016-09-18: 80 mg via INTRAMUSCULAR

## 2016-09-18 MED ORDER — TIOTROPIUM BROMIDE MONOHYDRATE 18 MCG IN CAPS: 18.0000 ug | ORAL_CAPSULE | Freq: Every day | RESPIRATORY_TRACT | 3 refills | Status: DC

## 2016-09-18 MED ORDER — CYANOCOBALAMIN 1000 MCG/ML IJ SOLN
1000.0000 ug | Freq: Once | INTRAMUSCULAR | Status: AC
  Administered 2016-09-18: 1000 ug via INTRAMUSCULAR

## 2016-09-18 MED ORDER — PREDNISONE 10 MG PO TABS: ORAL_TABLET | ORAL | 0 refills | Status: DC

## 2016-09-18 NOTE — Progress Notes (Signed)
Subjective:    Patient ID: Michele Martin, female    DOB: 12-23-1945, 71 y.o.   MRN: [id number]  HPI  Here to f/u, c/o 2-3 days mild worwening scant prod cough, wheeze and sob, not sure about fever..  Pt denies chest pain, orthopnea, PND, increased LE swelling, palpitations, dizziness or syncope.  Did have recent home screen test per insurance nurse suggestive of LE vascular insufficiency, asks for f/u testing.  Pt denies new neurological symptoms such as new headache, or facial or extremity weakness or numbness  Asks for B12 IM,  Pt denies polydipsia, polyuria,  Past Medical History:  Diagnosis Date  . Acute angle-closure glaucoma 02/14/2010  . ALLERGIC RHINITIS 09/19/2009  . ANXIETY 04/15/2010  . Arthritis   . ASTHMA 09/19/2009  . BACK PAIN 02/14/2010  . Chronic bronchitis 04/14/2011  . CHRONIC OBSTRUCTIVE PULMONARY DISEASE, ACUTE EXACERBATION 12/19/2009  . Complication of anesthesia   . COPD 06/27/2009  . DEPRESSION 09/19/2009  . HOARSENESS 10/17/2009  . HYPERLIPIDEMIA 09/19/2009  . MENOPAUSAL DISORDER 08/20/2010  . OSTEOPENIA 09/10/2010  . Osteoporosis, unspecified 04/20/2014  . Rotator cuff tear 06/23/2011  . TINNITUS 09/10/2010  . TREMOR 02/14/2010  . Tremor 07/30/2011  . URI 09/10/2010  . Wheezing 06/27/2009   Past Surgical History:  Procedure Laterality Date  . ABDOMINAL HYSTERECTOMY    . cervical fusion 2013 - Dr Vertell Limber    . Normal Heart Cath  2001   Dr. Johnsie Cancel  . s/p laser tx for glaucoma     no vision loss    reports that she has quit smoking. Her smoking use included Cigarettes. She started smoking about 14 months ago. She has a 40.00 pack-year smoking history. She has never used smokeless tobacco. She reports that she does not drink alcohol or use drugs. family history includes Dementia in her mother; Rheum arthritis in her maternal uncle. Allergies  Allergen Reactions  . Pravastatin Itching and Other (See Comments)    Myalgias/cramping  . Simvastatin Itching and Other  (See Comments)    Myalgias/cramping  . Statins Itching and Other (See Comments)    Myalgias/cramping   Current Outpatient Prescriptions on File Prior to Visit  Medication Sig Dispense Refill  . albuterol (PROVENTIL HFA) 108 (90 BASE) MCG/ACT inhaler Inhale 2 puffs into the lungs every 6 (six) hours as needed for wheezing or shortness of breath. 18 g 5  . albuterol (PROVENTIL) (2.5 MG/3ML) 0.083% nebulizer solution Take 3 mLs (2.5 mg total) by nebulization every 6 (six) hours as needed for wheezing or shortness of breath. 150 mL 5  . aspirin 81 MG EC tablet Take 81 mg by mouth daily.      [name] atorvastatin (LIPITOR) 20 MG tablet TAKE 1 TABLET BY MOUTH ONCE DAILY (Patient taking differently: TAKE '20mg'$  TABLET BY MOUTH ONCE in the evening) 90 tablet 3  . budesonide-formoterol (SYMBICORT) 160-4.5 MCG/ACT inhaler Inhale 2 puffs into the lungs 2 (two) times daily. 3 Inhaler 3  . clonazePAM (KLONOPIN) 0.5 MG tablet Take 1 tablet (0.5 mg total) by mouth 2 (two) times daily as needed for anxiety. 60 tablet 5  . dextromethorphan-guaiFENesin (MUCINEX DM) 30-600 MG 12hr tablet Take 2 tablets by mouth 2 (two) times daily. 60 tablet 0  . diphenhydrAMINE (BENADRYL) 25 mg capsule Take 25 mg by mouth every 6 (six) hours as needed for allergies.    [name] donepezil (ARICEPT) 10 MG tablet Take 1 tablet daily (Patient taking differently: Take 10 mg by mouth at bedtime. ) 90 tablet  3  . escitalopram (LEXAPRO) 20 MG tablet Take 0.5 tablets (10 mg total) by mouth daily. 45 tablet 3  . montelukast (SINGULAIR) 10 MG tablet TAKE 1 TABLET BY MOUTH EVERY NIGHT AT BEDTIME (Patient taking differently: TAKE '10mg'$  TABLET BY MOUTH EVERY NIGHT AT BEDTIME) 30 tablet 11  . SYMBICORT 160-4.5 MCG/ACT inhaler INHALE 2 PUFFS BY MOUTH TWICE DAILY 30.6 g 0   No current facility-administered medications on file prior to visit.    Review of Systems  Constitutional: Negative for unusual diaphoresis or night sweats HENT: Negative for ear swelling  or discharge Eyes: Negative for worsening visual haziness  Respiratory: Negative for choking and stridor.   Gastrointestinal: Negative for distension or worsening eructation Genitourinary: Negative for retention or change in urine volume.  Musculoskeletal: Negative for other MSK pain or swelling Skin: Negative for color change and worsening wound Neurological: Negative for tremors and numbness other than noted  Psychiatric/Behavioral: Negative for decreased concentration or agitation other than above   All other system neg per pt    Objective:   Physical Exam BP 112/86   Pulse 74   Temp 97.8 F (36.6 C)   Ht '5\' 1"'$  (1.549 m)   Wt 103 lb (46.7 kg)   SpO2 96%   BMI 19.46 kg/m  VS noted,  Constitutional: Pt appears in no apparent distress HENT: Head: NCAT.  Right Ear: External ear normal.  Left Ear: External ear normal.  Eyes: . Pupils are equal, round, and reactive to light. Conjunctivae and EOM are normal Neck: Normal range of motion. Neck supple.  Cardiovascular: Normal rate and regular rhythm.   Pulmonary/Chest: Effort normal and breath sounds moderately decreased with bilat insp rales, no wheezing.  Neurological: Pt is alert. Not confused , motor grossly intact Skin: Skin is warm. No rash, no LE edema Psychiatric: Pt behavior is normal. No agitation. 1+ nervous  No other new exam findings Lab Results  Component Value Date   WBC 15.1 (H) 08/06/2016   HGB 15.0 08/06/2016   HCT 45.3 08/06/2016   PLT 181 08/06/2016   GLUCOSE 134 (H) 08/06/2016   CHOL 222 (H) 08/09/2015   TRIG 95.0 08/09/2015   HDL 89.50 08/09/2015   LDLDIRECT 164.4 07/19/2013   LDLCALC 114 (H) 08/09/2015   ALT 25 08/05/2016   AST 21 08/05/2016   NA 138 08/06/2016   K 4.1 08/06/2016   CL 98 (L) 08/06/2016   CREATININE 0.52 08/06/2016   BUN 37 (H) 08/06/2016   CO2 33 (H) 08/06/2016   TSH 0.104 (L) 08/06/2016   HGBA1C 5.6 08/01/2011       Assessment & Plan:

## 2016-09-18 NOTE — Patient Instructions (Addendum)
You had the steroid shot today, and the B12 shot  Please take all new medication as prescribed - the prednisone  Please continue all other medications as before, and refills have been done if requested.  Please have the pharmacy call with any other refills you may need.  Please keep your appointments with your specialists as you may have planned  You will be contacted regarding the referral for: Pulmonary, and the leg circulation test  You will be contacted by phone if any changes need to be made immediately.  Otherwise, you will receive a letter about your results with an explanation, but please check with MyChart first.  Please remember to sign up for MyChart if you have not done so, as this will be important to you in the future with finding out test results, communicating by private email, and scheduling acute appointments online when needed.

## 2016-09-19 ENCOUNTER — Telehealth: Payer: Self-pay | Admitting: Internal Medicine

## 2016-09-19 NOTE — Telephone Encounter (Signed)
Called pt to let her know her RX tiotropium and paper work ready to pick up front

## 2016-09-20 NOTE — Assessment & Plan Note (Signed)
Will need pulm f/u, pt requests new pulmonary

## 2016-09-20 NOTE — Assessment & Plan Note (Signed)
Ok for LE arterial dopplers,  to f/u any worsening symptoms or concerns

## 2016-09-20 NOTE — Assessment & Plan Note (Signed)
For b12 IM today, to f/u any worsening symptoms or concerns  

## 2016-09-20 NOTE — Assessment & Plan Note (Signed)
Mild to mod it seems compared to previous, for depomedrol 80 IM, predpac asd, inhalers asd

## 2016-09-22 ENCOUNTER — Telehealth: Payer: Self-pay | Admitting: Internal Medicine

## 2016-09-22 ENCOUNTER — Other Ambulatory Visit: Payer: Self-pay

## 2016-09-22 DIAGNOSIS — J449 Chronic obstructive pulmonary disease, unspecified: Secondary | ICD-10-CM | POA: Diagnosis not present

## 2016-09-22 DIAGNOSIS — Z9981 Dependence on supplemental oxygen: Secondary | ICD-10-CM | POA: Diagnosis not present

## 2016-09-22 DIAGNOSIS — M6281 Muscle weakness (generalized): Secondary | ICD-10-CM | POA: Diagnosis not present

## 2016-09-22 NOTE — Telephone Encounter (Signed)
PM has next available consult slot- PM please advise if you're ok with taking on patient.  Thanks!

## 2016-09-22 NOTE — Telephone Encounter (Signed)
Please advise if ok to switch docs, thanks

## 2016-09-22 NOTE — Telephone Encounter (Signed)
Fine with me

## 2016-09-22 NOTE — Patient Outreach (Signed)
[facility]) Care Management  09/22/2016  Michele Martin 03-Jun-1946 [id number]   Telephonic Monthly Assessment  Program:  COPD 08/13/16 -   Subjective:  Patient reached and completed call.   [name] [address] [phone] Michele Martin (M)  Providers: Primary MD:  Dr. Cathlean Cower  -  Last appt: 09/18/16  HH:  PT services active  Well Visit 09/30/16  Insurance:  [facility] Medicare   (verified by RN CM 08/21/2016)  Psycho/Social: Patient lives alone in her home. C/o weakness in legs with some improvement with HH PT services.  Did have recent home screen test per insurance nurse suggestive of LE vascular insufficiency and asking Dr. Jenny Martin for f/u testing. States recent PAC screening test indicated circulation issues in legs.  States Dr. Jenny Martin is making a new referral to follow-up.   Ambulating with cane and keeping walker close by if needed. Falls: none but high risk due to weakness in legs.  Emergency Contact:  Sister who lives 5 minutes away.   Transportation:  Sister or granddaughter.  MD has asked patient not to drive right now.  Advance Directives:  H/o Patient signed a Designated party release to allow her son, Michele Martin, daughter Michele Martin and granddaughter Michele Martin , to have access to her medical records/information.   DME:  Michele Martin with front wheels, built in shower seat, Nebulizer, scales, dentures, eyeglasses.   Co-morbidities:  HF, COPD, Anxiety, Protein-calorie malnutrition, severe Admission 08/04/2016 - 08/09/2016  Acute respiratory failure with hypoxia and hypercapnia   COPD Patient completed f/u with Dr. Jenny Martin 09/18/2016 for c/o 2-3 days of COPD symptoms.  -Symbicort Inhaler  twice a day -Spiriva Inhaler daily morning  -Albuterol Inhaler (using only every morning)  Advised may use every 6 hours -Nebulizer: Albuterol  (using only when symptoms are very bad).  RN CM advised may use Albuterol every 6 hours as needed.  Weight  up 3 lbs this month; currently 103 lbs.  Ensure one daily.   Medications:  Prevnar (PCV13) 07/19/2013 Pneumovax (PP 06/27/2009 Flu Vaccine 05/15/2016 tDAP Vaccine 07/30/2009  Encounter Medications:  Outpatient Encounter Prescriptions as of 09/22/2016  Medication Sig  . albuterol (PROVENTIL HFA) 108 (90 BASE) MCG/ACT inhaler Inhale 2 puffs into the lungs every 6 (six) hours as needed for wheezing or shortness of breath.  [name] albuterol (PROVENTIL) (2.5 MG/3ML) 0.083% nebulizer solution Take 3 mLs (2.5 mg total) by nebulization every 6 (six) hours as needed for wheezing or shortness of breath.  [name] aspirin 81 MG EC tablet Take 81 mg by mouth daily.    [name] atorvastatin (LIPITOR) 20 MG tablet TAKE 1 TABLET BY MOUTH ONCE DAILY (Patient taking differently: TAKE '20mg'$  TABLET BY MOUTH ONCE in the evening)  . budesonide-formoterol (SYMBICORT) 160-4.5 MCG/ACT inhaler Inhale 2 puffs into the lungs 2 (two) times daily.  . clonazePAM (KLONOPIN) 0.5 MG tablet Take 1 tablet (0.5 mg total) by mouth 2 (two) times daily as needed for anxiety.  [name] dextromethorphan-guaiFENesin (MUCINEX DM) 30-600 MG 12hr tablet Take 2 tablets by mouth 2 (two) times daily.  . diphenhydrAMINE (BENADRYL) 25 mg capsule Take 25 mg by mouth every 6 (six) hours as needed for allergies.  [name] donepezil (ARICEPT) 10 MG tablet Take 1 tablet daily (Patient taking differently: Take 10 mg by mouth at bedtime. )  . escitalopram (LEXAPRO) 20 MG tablet Take 0.5 tablets (10 mg total) by mouth daily.  [name] levofloxacin (LEVAQUIN) 500 MG tablet   . montelukast (SINGULAIR) 10  MG tablet TAKE 1 TABLET BY MOUTH EVERY NIGHT AT BEDTIME (Patient taking differently: TAKE '10mg'$  TABLET BY MOUTH EVERY NIGHT AT BEDTIME)  . nystatin (MYCOSTATIN) 100000 UNIT/ML suspension   . predniSONE (DELTASONE) 10 MG tablet 3 tabs by mouth per day for 3 days,2 tabs per day for 3 days,1 tab per day for 3 days  . SYMBICORT 160-4.5 MCG/ACT inhaler INHALE 2 PUFFS BY MOUTH TWICE DAILY  .  tiotropium (SPIRIVA HANDIHALER) 18 MCG inhalation capsule Place 1 capsule (18 mcg total) into inhaler and inhale daily.   No facility-administered encounter medications on file as of 09/22/2016.     Functional Status:  In your present state of health, do you have any difficulty performing the following activities: 08/21/2016 08/05/2016  Hearing? N N  Vision? N N  Difficulty concentrating or making decisions? N N  Walking or climbing stairs? Y Y  Dressing or bathing? N N  Doing errands, shopping? N N  Preparing Food and eating ? N -  Using the Toilet? N -  In the past six months, have you accidently leaked urine? N -  Do you have problems with loss of bowel control? N -  Managing your Medications? N -  Managing your Finances? N -  Housekeeping or managing your Housekeeping? N -  Some recent data might be hidden    Fall/Depression Screening: PHQ 2/9 Scores 08/21/2016 06/25/2016 08/14/2015 08/02/2014 11/10/2013 07/19/2013  PHQ - 2 Score 0 1 1 0 1 2    Fall Risk  09/22/2016 08/21/2016 06/25/2016 04/16/2016 08/10/2015  Falls in the past year? No No No Yes Yes  Number falls in past yr: - - - 1 2 or more  Injury with Fall? - - - No No  Risk Factor Category  - - - - High Fall Risk  Risk for fall due to : Impaired balance/gait;Impaired mobility Impaired mobility - - -  Follow up - - - - Falls evaluation completed     Plan:  [facility] COPD Program 08/13/16 - 08/13/16 Screening and Initial Assessment 08/21/16 Transition of Care: (Short Term)  08/13/16 - 08/27/2016 [facility] Telephonic RN CM service date:  08/13/16 Program:  COPD 08/13/16 -   COPD RN CM encouraged to take medications as ordered and report any change in condition or symptoms to MD.  RN CM discussed fall prevention and advised to use walker during this time.  RN CM encouraged ensure as tolerated and increase caloric intake as much as possible. RN CM will continue to follow outcome of LE arterial doppler study and fall risk.   E[facility]i Educational  Materials (mailed 08/21/2016 - reviewed 09/22/16) -COPD:  Medication  -COPD:  When To Get Help -Inhalers:  Using Them The Right Way -Your Lungs -COPD Action Plan Magnet  RN CM scheduled for next contact call within one month for telephonic monthly assessment  RN CM advised to please notify MD of any changes in condition prior to scheduled appt's.   RN CM provided contact name and # [phone] office, [phone] cell or main office # [phone] and 24-hour nurse line # [phone]. RN CM confirmed patient is aware of 911 services for urgent emergency needs.  Nathaneil Canary, BSN, RN, [place] Management Care Management Coordinator [phone] Direct [phone] Cell [phone] Office [phone] Fax [email or web address]

## 2016-09-23 NOTE — Telephone Encounter (Signed)
Ok with me 

## 2016-09-24 DIAGNOSIS — J449 Chronic obstructive pulmonary disease, unspecified: Secondary | ICD-10-CM | POA: Diagnosis not present

## 2016-09-24 DIAGNOSIS — R0902 Hypoxemia: Secondary | ICD-10-CM | POA: Diagnosis not present

## 2016-09-24 NOTE — Telephone Encounter (Signed)
Spoke with pt. She has been scheduled to see PM on 10/17/16 at 3:15pm. Nothing further was needed.

## 2016-09-25 DIAGNOSIS — M6281 Muscle weakness (generalized): Secondary | ICD-10-CM | POA: Diagnosis not present

## 2016-09-25 DIAGNOSIS — J449 Chronic obstructive pulmonary disease, unspecified: Secondary | ICD-10-CM | POA: Diagnosis not present

## 2016-09-25 DIAGNOSIS — Z9981 Dependence on supplemental oxygen: Secondary | ICD-10-CM | POA: Diagnosis not present

## 2016-09-29 NOTE — Progress Notes (Signed)
Pre visit review using our clinic review tool, if applicable. No additional management support is needed unless otherwise documented below in the visit note. 

## 2016-09-29 NOTE — Progress Notes (Addendum)
Subjective:   Michele Martin is a 71 y.o. female who presents for an Initial Medicare Annual Wellness Visit.  Review of Systems    No ROS.  Medicare Wellness Visit.   Cardiac Risk Factors include: advanced age (>58mn, >>2women);dyslipidemia;sedentary lifestyle Sleep patterns: has interrupted sleep, is not rested upon awakening, has daytime sleepiness and sleeps 3-6 hours nightly.  Discussed sleep tips and stress reduction tips.  Home Safety/Smoke Alarms: Feels safe in home. Smoke alarms in place.    Living environment; residence and Firearm Safety: 1-story house/ trailer, no firearms.lives alone, patient states she has close family support  Seat Belt Safety/Bike Helmet: Wears seat belt.   Counseling:   Eye Exam- yearly Dental- dentures  Female:   Pap- N/A       Mammo- Last 07/11/14,  BI-RADS CATEGORY  1: Negative Referral placed today Dexa scan-  Last 06/05/16     CCS- Last 11/27/15, precancerous polyps, recall 3 years.       Objective:    Today's Vitals   09/30/16 1314 09/30/16 1328  BP: 110/72   Pulse: 81   Resp: 20   SpO2: 98%   Weight: 107 lb (48.5 kg)   Height: '5\' 1"'$  (1.549 m)   PainSc:  3    Body mass index is 20.22 kg/m.   Current Medications (verified) Outpatient Encounter Prescriptions as of 09/30/2016  Medication Sig  . albuterol (PROVENTIL HFA) 108 (90 BASE) MCG/ACT inhaler Inhale 2 puffs into the lungs every 6 (six) hours as needed for wheezing or shortness of breath.  .Marland Kitchenaspirin 81 MG EC tablet Take 81 mg by mouth daily.    .Marland Kitchenatorvastatin (LIPITOR) 20 MG tablet TAKE 1 TABLET BY MOUTH ONCE DAILY (Patient taking differently: TAKE '20mg'$  TABLET BY MOUTH ONCE in the evening)  . B Complex Vitamins (B-COMPLEX/B-12 PO) Take by mouth daily.  . budesonide-formoterol (SYMBICORT) 160-4.5 MCG/ACT inhaler Inhale 2 puffs into the lungs 2 (two) times daily.  . clonazePAM (KLONOPIN) 0.5 MG tablet Take 1 tablet (0.5 mg total) by mouth 2 (two) times daily as needed  for anxiety.  .Marland Kitchendextromethorphan-guaiFENesin (MUCINEX DM) 30-600 MG 12hr tablet Take 2 tablets by mouth 2 (two) times daily.  . diphenhydrAMINE (BENADRYL) 25 mg capsule Take 25 mg by mouth every 6 (six) hours as needed for allergies.  .Marland Kitchendonepezil (ARICEPT) 10 MG tablet Take 1 tablet daily (Patient taking differently: Take 10 mg by mouth at bedtime. )  . escitalopram (LEXAPRO) 20 MG tablet Take 0.5 tablets (10 mg total) by mouth daily.  . montelukast (SINGULAIR) 10 MG tablet TAKE 1 TABLET BY MOUTH EVERY NIGHT AT BEDTIME (Patient taking differently: TAKE '10mg'$  TABLET BY MOUTH EVERY NIGHT AT BEDTIME)  . SYMBICORT 160-4.5 MCG/ACT inhaler INHALE 2 PUFFS BY MOUTH TWICE DAILY  . tiotropium (SPIRIVA HANDIHALER) 18 MCG inhalation capsule Place 1 capsule (18 mcg total) into inhaler and inhale daily.  . [DISCONTINUED] albuterol (PROVENTIL) (2.5 MG/3ML) 0.083% nebulizer solution Take 3 mLs (2.5 mg total) by nebulization every 6 (six) hours as needed for wheezing or shortness of breath.  . [DISCONTINUED] levofloxacin (LEVAQUIN) 500 MG tablet   . [DISCONTINUED] nystatin (MYCOSTATIN) 100000 UNIT/ML suspension   . [DISCONTINUED] predniSONE (DELTASONE) 10 MG tablet 3 tabs by mouth per day for 3 days,2 tabs per day for 3 days,1 tab per day for 3 days  . [DISCONTINUED] tiotropium (SPIRIVA HANDIHALER) 18 MCG inhalation capsule Place 1 capsule (18 mcg total) into inhaler and inhale daily.   No facility-administered  encounter medications on file as of 09/30/2016.     Allergies (verified) Pravastatin; Simvastatin; and Statins   History: Past Medical History:  Diagnosis Date  . Acute angle-closure glaucoma 02/14/2010  . ALLERGIC RHINITIS 09/19/2009  . ANXIETY 04/15/2010  . Arthritis   . ASTHMA 09/19/2009  . BACK PAIN 02/14/2010  . Chronic bronchitis 04/14/2011  . CHRONIC OBSTRUCTIVE PULMONARY DISEASE, ACUTE EXACERBATION 12/19/2009  . Complication of anesthesia   . COPD 06/27/2009  . DEPRESSION 09/19/2009  . HOARSENESS  10/17/2009  . HYPERLIPIDEMIA 09/19/2009  . MENOPAUSAL DISORDER 08/20/2010  . OSTEOPENIA 09/10/2010  . Osteoporosis, unspecified 04/20/2014  . Rotator cuff tear 06/23/2011  . TINNITUS 09/10/2010  . TREMOR 02/14/2010  . Tremor 07/30/2011  . URI 09/10/2010  . Wheezing 06/27/2009   Past Surgical History:  Procedure Laterality Date  . ABDOMINAL HYSTERECTOMY    . cervical fusion 2013 - Dr Vertell Limber    . Normal Heart Cath  2001   Dr. Johnsie Cancel  . s/p laser tx for glaucoma     no vision loss   Family History  Problem Relation Age of Onset  . Dementia Mother   . Rheum arthritis Maternal Uncle   . Colon cancer Neg Hx    Social History   Occupational History  . 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 Smoking status: Former Smoker    Packs/day: 1.00    Years: 40.00    Types: Cigarettes    Start date: 07/03/2015  . Smokeless tobacco: Never Used  . Alcohol use No  . Drug use: No  . Sexual activity: No    Tobacco Counseling Counseling given: Not Answered   Activities of Daily Living In your present state of health, do you have any difficulty performing the following activities: 09/30/2016 08/21/2016  Hearing? N N  Vision? N N  Difficulty concentrating or making decisions? N N  Walking or climbing stairs? N Y  Dressing or bathing? N N  Doing errands, shopping? N N  Preparing Food and eating ? N N  Using the Toilet? N N  In the past six months, have you accidently leaked urine? N N  Do you have problems with loss of bowel control? N N  Managing your Medications? N N  Managing your Finances? N N  Housekeeping or managing your Housekeeping? N N  Some recent data might be hidden    Immunizations and Health Maintenance Immunization History  Administered Date(s) Administered  . Influenza Split 04/14/2011, 04/15/2012  . Influenza Whole 03/26/2009, 04/30/2009, 04/15/2010  . Influenza,inj,Quad PF,36+ Mos 03/31/2013, 04/20/2014,  04/12/2015, 05/15/2016  . Pneumococcal Conjugate-13 07/19/2013  . Pneumococcal Polysaccharide-23 06/27/2009, 09/30/2016  . Td 07/30/2009   Health Maintenance Due  Topic Date Due  . MAMMOGRAM  07/11/2016    Patient Care Team: Biagio Borg, MD as PCP - Diller, RN as [place] any recent Medical Services you may have received from other than [facility] providers in the past year (date may be approximate).     Assessment:   This is a routine wellness examination for Michele Martin. Physical assessment deferred to PCP.   Hearing/Vision screen Hearing Screening Comments: Able to hear conversational tones w/o difficulty. No issues reported. Passed whisper test.  Vision Screening Comments: Wear glasses  Dietary issues and exercise activities discussed: Current Exercise Habits: Home exercise routine (continues to do physical therapy exercises), Type of exercise: Other - see comments (  does exercises that physical therapy advised her to do), Time (Minutes): 20, Frequency (Times/Week): 4, Weekly Exercise (Minutes/Week): 80, Intensity: Mild, Exercise limited by: respiratory conditions(s);orthopedic condition(s)   Diet (meal preparation, eat out, water intake, caffeinated beverages, dairy products, fruits and vegetables): Patient states that she nibbles more than she really eats meals. She tries to drink 1 ensure per day but states that it fills her up too much. States that she does not drink much water, mainly due to the taste. Drinks several cups of coffee per day, tea and soda.   Discussed her eating small frequent planned meals. Suggested that patient drink half of a high protein ensure several times per day to keep nutrition in her system. Encouraged her to try flavor packets or add lemon to water, educated regarding dehydration and the importance of water hydration.   Goals    . want to increase energy and improve my strength             Will continue to eat small frequent meals and drink supplements, continue physical therapy exercises. Try to drink more water with lemon. Go to church grief counseling. Quit smoking 1-800-quit-smoking      Depression Screen PHQ 2/9 Scores 09/30/2016 09/30/2016 09/22/2016 08/21/2016 06/25/2016 08/14/2015 08/02/2014  PHQ - 2 Score 2 1 0 0 1 1 0  PHQ- 9 Score 6 - - - - - -    Fall Risk Fall Risk  09/30/2016 09/30/2016 09/22/2016 08/21/2016 06/25/2016  Falls in the past year? Yes Yes No No No  Number falls in past yr: 2 or more - - - -  Injury with Fall? No - - - -  Risk Factor Category  - - - - -  Risk for fall due to : Impaired balance/gait;Impaired mobility Impaired balance/gait;Impaired mobility;Impaired vision Impaired balance/gait;Impaired mobility Impaired mobility -  Follow up Falls prevention discussed;Education provided - - - -    Cognitive Function: MMSE - Mini Mental State Exam 04/25/2016 08/10/2015  Orientation to time 5 5  Orientation to Place 5 5  Registration 3 3  Attention/ Calculation 5 5  Recall 1 1  Language- name 2 objects 2 2  Language- repeat 1 1  Language- follow 3 step command 3 3  Language- read & follow direction 1 1  Write a sentence 1 1  Copy design 1 1  Total score 28 28        Screening Tests Health Maintenance  Topic Date Due  . MAMMOGRAM  07/11/2016  . COLONOSCOPY  11/27/2018  . TETANUS/TDAP  07/31/2019  . INFLUENZA VACCINE  Completed  . DEXA SCAN  Completed  . Hepatitis C Screening  Completed  . PNA vac Low Risk Adult  Completed      Plan:    Patient will go to grief counseling provided at church. She will call health coach if additional counseling is needed.   Patient will continue to try to quit smoking. 1-800-quit-smoking phone number provided. She will contact health coach if she would like to go to smoke cessation classes.   Discuss sleep tips and stress reduction tips. Education attached to AVS.  Pneumonia vaccine and referral for  mammogram given today during AW[facility].  During the course of the visit, Kamyiah was educated and counseled about the following appropriate screening and preventive services:   Vaccines to include Pneumoccal, Influenza, Hepatitis B, Td, Zostavax, HCV  Cardiovascular disease screening  Colorectal cancer screening  Bone density screening  Diabetes screening  Glaucoma screening  Mammography/PAP  Nutrition counseling  Patient Instructions (the written plan) were given to the patient.    Michiel Cowboy, RN   09/30/2016   Medical screening examination/treatment/procedure(s) were performed by non-physician practitioner and as supervising physician I was immediately available for consultation/collaboration. I agree with above. Cathlean Cower, MD

## 2016-09-30 ENCOUNTER — Other Ambulatory Visit: Payer: Self-pay | Admitting: Internal Medicine

## 2016-09-30 ENCOUNTER — Other Ambulatory Visit: Payer: Self-pay | Admitting: *Deleted

## 2016-09-30 ENCOUNTER — Ambulatory Visit (INDEPENDENT_AMBULATORY_CARE_PROVIDER_SITE_OTHER): Payer: Medicare Other | Admitting: *Deleted

## 2016-09-30 VITALS — BP 110/72 | HR 81 | Resp 20 | Ht 61.0 in | Wt 107.0 lb

## 2016-09-30 DIAGNOSIS — Z0000 Encounter for general adult medical examination without abnormal findings: Secondary | ICD-10-CM | POA: Diagnosis not present

## 2016-09-30 DIAGNOSIS — Z1239 Encounter for other screening for malignant neoplasm of breast: Secondary | ICD-10-CM

## 2016-09-30 DIAGNOSIS — I739 Peripheral vascular disease, unspecified: Secondary | ICD-10-CM

## 2016-09-30 DIAGNOSIS — Z23 Encounter for immunization: Secondary | ICD-10-CM | POA: Diagnosis not present

## 2016-09-30 MED ORDER — TIOTROPIUM BROMIDE MONOHYDRATE 18 MCG IN CAPS: 18.0000 ug | ORAL_CAPSULE | Freq: Every day | RESPIRATORY_TRACT | 3 refills | Status: DC

## 2016-09-30 NOTE — Patient Instructions (Addendum)
Ms. Toman , Thank you for taking time to come for your Medicare Wellness Visit. I appreciate your ongoing commitment to your health goals. Please review the following plan we discussed and let me know if I can assist you in the future.   These are the goals we discussed: Goals    . want to increase energy and improve my strength           Will continue to eat small frequent meals and drink supplements, continue physical therapy exercises. Try to drink more water with lemon. Go to church grief counseling. Quit smoking 1-800-quit-smoking       This is a list of the screening recommended for you and due dates:  Health Maintenance  Topic Date Due  . Mammogram  07/11/2016  . Colon Cancer Screening  11/27/2018  . Tetanus Vaccine  07/31/2019  . Flu Shot  Completed  . DEXA scan (bone density measurement)  Completed  .  Hepatitis C: One time screening is recommended by Center for Disease Control  (CDC) for  adults born from [birth year] through 1966.   Completed  . Pneumonia vaccines  Completed   Please call me if you would like help to find additional grief counseling or would like to go to smoke cessation classes Emelia Loron [phone]   Stress and Stress Management Stress is a normal reaction to life events. It is what you feel when life demands more than you are used to or more than you can handle. Some stress can be useful. For example, the stress reaction can help you catch the last bus of the day, study for a test, or meet a deadline at work. But stress that occurs too often or for too long can cause problems. It can affect your emotional health and interfere with relationships and normal daily activities. Too much stress can weaken your immune system and increase your risk for physical illness. If you already have a medical problem, stress can make it worse. What are the causes? All sorts of life events may cause stress. An event that causes stress for one person may not be stressful for  another person. Major life events commonly cause stress. These may be positive or negative. Examples include losing your job, moving into a new home, getting married, having a baby, or losing a loved one. Less obvious life events may also cause stress, especially if they occur day after day or in combination. Examples include working long hours, driving in traffic, caring for children, being in debt, or being in a difficult relationship. What are the signs or symptoms? Stress may cause emotional symptoms including, the following:  Anxiety. This is feeling worried, afraid, on edge, overwhelmed, or out of control.  Anger. This is feeling irritated or impatient.  Depression. This is feeling sad, down, helpless, or guilty.  Difficulty focusing, remembering, or making decisions. Stress may cause physical symptoms, including the following:  Aches and pains. These may affect your head, neck, back, stomach, or other areas of your body.  Tight muscles or clenched jaw.  Low energy or trouble sleeping. Stress may cause unhealthy behaviors, including the following:  Eating to feel better (overeating) or skipping meals.  Sleeping too little, too much, or both.  Working too much or putting off tasks (procrastination).  Smoking, drinking alcohol, or using drugs to feel better. How is this diagnosed? Stress is diagnosed through an assessment by your health care provider. Your health care provider will ask questions about your symptoms and  any stressful life events.Your health care provider will also ask about your medical history and may order blood tests or other tests. Certain medical conditions and medicine can cause physical symptoms similar to stress. Mental illness can cause emotional symptoms and unhealthy behaviors similar to stress. Your health care provider may refer you to a mental health professional for further evaluation. How is this treated? Stress management is the recommended  treatment for stress.The goals of stress management are reducing stressful life events and coping with stress in healthy ways. Techniques for reducing stressful life events include the following:  Stress identification. Self-monitor for stress and identify what causes stress for you. These skills may help you to avoid some stressful events.  Time management. Set your priorities, keep a calendar of events, and learn to say "no." These tools can help you avoid making too many commitments. Techniques for coping with stress include the following:  Rethinking the problem. Try to think realistically about stressful events rather than ignoring them or overreacting. Try to find the positives in a stressful situation rather than focusing on the negatives.  Exercise. Physical exercise can release both physical and emotional tension. The key is to find a form of exercise you enjoy and do it regularly.  Relaxation techniques. These relax the body and mind. Examples include yoga, meditation, tai chi, biofeedback, deep breathing, progressive muscle relaxation, listening to music, being out in nature, journaling, and other hobbies. Again, the key is to find one or more that you enjoy and can do regularly.  Healthy lifestyle. Eat a balanced diet, get plenty of sleep, and do not smoke. Avoid using alcohol or drugs to relax.  Strong support network. Spend time with family, friends, or other people you enjoy being around.Express your feelings and talk things over with someone you trust. Counseling or talktherapy with a mental health professional may be helpful if you are having difficulty managing stress on your own. Medicine is typically not recommended for the treatment of stress.Talk to your health care provider if you think you need medicine for symptoms of stress. Follow these instructions at home:  Keep all follow-up visits as directed by your health care provider.  Take all medicines as directed by your  health care provider. Contact a health care provider if:  Your symptoms get worse or you start having new symptoms.  You feel overwhelmed by your problems and can no longer manage them on your own. Get help right away if:  You feel like hurting yourself or someone else. This information is not intended to replace advice given to you by your health care provider. Make sure you discuss any questions you have with your health care provider. Document Released: 01/07/2001 Document Revised: 12/20/2015 Document Reviewed: 03/08/2013 Elsevier Interactive Patient Education  2017 [place]. Insomnia Insomnia is a sleep disorder that makes it difficult to fall asleep or to stay asleep. Insomnia can cause tiredness (fatigue), low energy, difficulty concentrating, mood swings, and poor performance at work or school. There are three different ways to classify insomnia:  Difficulty falling asleep.  Difficulty staying asleep.  Waking up too early in the morning. Any type of insomnia can be long-term (chronic) or short-term (acute). Both are common. Short-term insomnia usually lasts for three months or less. Chronic insomnia occurs at least three times a week for longer than three months. What are the causes? Insomnia may be caused by another condition, situation, or substance, such as:  Anxiety.  Certain medicines.  Gastroesophageal reflux disease (GERD)  or other gastrointestinal conditions.  Asthma or other breathing conditions.  Restless legs syndrome, sleep apnea, or other sleep disorders.  Chronic pain.  Menopause. This may include hot flashes.  Stroke.  Abuse of alcohol, tobacco, or illegal drugs.  Depression.  Caffeine.  Neurological disorders, such as Alzheimer disease.  An overactive thyroid (hyperthyroidism). The cause of insomnia may not be known. What increases the risk? Risk factors for insomnia include:  Gender. Women are more commonly affected than men.  Age.  Insomnia is more common as you get older.  Stress. This may involve your professional or personal life.  Income. Insomnia is more common in people with lower income.  Lack of exercise.  Irregular work schedule or night shifts.  Traveling between different time zones. What are the signs or symptoms? If you have insomnia, trouble falling asleep or trouble staying asleep is the main symptom. This may lead to other symptoms, such as:  Feeling fatigued.  Feeling nervous about going to sleep.  Not feeling rested in the morning.  Having trouble concentrating.  Feeling irritable, anxious, or depressed. How is this treated? Treatment for insomnia depends on the cause. If your insomnia is caused by an underlying condition, treatment will focus on addressing the condition. Treatment may also include:  Medicines to help you sleep.  Counseling or therapy.  Lifestyle adjustments. Follow these instructions at home:  Take medicines only as directed by your health care provider.  Keep regular sleeping and waking hours. Avoid naps.  Keep a sleep diary to help you and your health care provider figure out what could be causing your insomnia. Include:  When you sleep.  When you wake up during the night.  How well you sleep.  How rested you feel the next day.  Any side effects of medicines you are taking.  What you eat and drink.  Make your bedroom a comfortable place where it is easy to fall asleep:  Put up shades or special blackout curtains to block light from outside.  Use a white noise machine to block noise.  Keep the temperature cool.  Exercise regularly as directed by your health care provider. Avoid exercising right before bedtime.  Use relaxation techniques to manage stress. Ask your health care provider to suggest some techniques that may work well for you. These may include:  Breathing exercises.  Routines to release muscle tension.  Visualizing peaceful  scenes.  Cut back on alcohol, caffeinated beverages, and cigarettes, especially close to bedtime. These can disrupt your sleep.  Do not overeat or eat spicy foods right before bedtime. This can lead to digestive discomfort that can make it hard for you to sleep.  Limit screen use before bedtime. This includes:  Watching TV.  Using your smartphone, tablet, and computer.  Stick to a routine. This can help you fall asleep faster. Try to do a quiet activity, brush your teeth, and go to bed at the same time each night.  Get out of bed if you are still awake after 15 minutes of trying to sleep. Keep the lights down, but try reading or doing a quiet activity. When you feel sleepy, go back to bed.  Make sure that you drive carefully. Avoid driving if you feel very sleepy.  Keep all follow-up appointments as directed by your health care provider. This is important. Contact a health care provider if:  You are tired throughout the day or have trouble in your daily routine due to sleepiness.  You continue to have  sleep problems or your sleep problems get worse. Get help right away if:  You have serious thoughts about hurting yourself or someone else. This information is not intended to replace advice given to you by your health care provider. Make sure you discuss any questions you have with your health care provider. Document Released: 07/11/2000 Document Revised: 12/14/2015 Document Reviewed: 04/14/2014 Elsevier Interactive Patient Education  2017 [place] Prevention in the Home Falls can cause injuries. They can happen to people of all ages. There are many things you can do to make your home safe and to help prevent falls. What can I do on the outside of my home?  Regularly fix the edges of walkways and driveways and fix any cracks.  Remove anything that might make you trip as you walk through a door, such as a raised step or threshold.  Trim any bushes or trees on the path to  your home.  Use bright outdoor lighting.  Clear any walking paths of anything that might make someone trip, such as rocks or tools.  Regularly check to see if handrails are loose or broken. Make sure that both sides of any steps have handrails.  Any raised decks and porches should have guardrails on the edges.  Have any leaves, snow, or ice cleared regularly.  Use sand or salt on walking paths during winter.  Clean up any spills in your garage right away. This includes oil or grease spills. What can I do in the bathroom?  Use night lights.  Install grab bars by the toilet and in the tub and shower. Do not use towel bars as grab bars.  Use non-skid mats or decals in the tub or shower.  If you need to sit down in the shower, use a plastic, non-slip stool.  Keep the floor dry. Clean up any water that spills on the floor as soon as it happens.  Remove soap buildup in the tub or shower regularly.  Attach bath mats securely with double-sided non-slip rug tape.  Do not have throw rugs and other things on the floor that can make you trip. What can I do in the bedroom?  Use night lights.  Make sure that you have a light by your bed that is easy to reach.  Do not use any sheets or blankets that are too big for your bed. They should not hang down onto the floor.  Have a firm chair that has side arms. You can use this for support while you get dressed.  Do not have throw rugs and other things on the floor that can make you trip. What can I do in the kitchen?  Clean up any spills right away.  Avoid walking on wet floors.  Keep items that you use a lot in easy-to-reach places.  If you need to reach something above you, use a strong step stool that has a grab bar.  Keep electrical cords out of the way.  Do not use floor polish or wax that makes floors slippery. If you must use wax, use non-skid floor wax.  Do not have throw rugs and other things on the floor that can make  you trip. What can I do with my stairs?  Do not leave any items on the stairs.  Make sure that there are handrails on both sides of the stairs and use them. Fix handrails that are broken or loose. Make sure that handrails are as long as the stairways.  Check any  carpeting to make sure that it is firmly attached to the stairs. Fix any carpet that is loose or worn.  Avoid having throw rugs at the top or bottom of the stairs. If you do have throw rugs, attach them to the floor with carpet tape.  Make sure that you have a light switch at the top of the stairs and the bottom of the stairs. If you do not have them, ask someone to add them for you. What else can I do to help prevent falls?  Wear shoes that:  Do not have high heels.  Have rubber bottoms.  Are comfortable and fit you well.  Are closed at the toe. Do not wear sandals.  If you use a stepladder:  Make sure that it is fully opened. Do not climb a closed stepladder.  Make sure that both sides of the stepladder are locked into place.  Ask someone to hold it for you, if possible.  Clearly mark and make sure that you can see:  Any grab bars or handrails.  First and last steps.  Where the edge of each step is.  Use tools that help you move around (mobility aids) if they are needed. These include:  Canes.  Walkers.  Scooters.  Crutches.  Turn on the lights when you go into a dark area. Replace any light bulbs as soon as they burn out.  Set up your furniture so you have a clear path. Avoid moving your furniture around.  If any of your floors are uneven, fix them.  If there are any pets around you, be aware of where they are.  Review your medicines with your doctor. Some medicines can make you feel dizzy. This can increase your chance of falling. Ask your doctor what other things that you can do to help prevent falls. This information is not intended to replace advice given to you by your health care provider.  Make sure you discuss any questions you have with your health care provider. Document Released: 05/10/2009 Document Revised: 12/20/2015 Document Reviewed: 08/18/2014 Elsevier Interactive Patient Education  2017 [employer].

## 2016-10-10 ENCOUNTER — Ambulatory Visit (HOSPITAL_COMMUNITY)
Admission: RE | Admit: 2016-10-10 | Discharge: 2016-10-10 | Disposition: A | Discharge status: Home / Self Care | Payer: Medicare Other | Source: Ambulatory Visit | Attending: Cardiovascular Disease | Admitting: Cardiovascular Disease

## 2016-10-10 DIAGNOSIS — J449 Chronic obstructive pulmonary disease, unspecified: Secondary | ICD-10-CM | POA: Insufficient documentation

## 2016-10-10 DIAGNOSIS — I739 Peripheral vascular disease, unspecified: Secondary | ICD-10-CM | POA: Insufficient documentation

## 2016-10-10 DIAGNOSIS — E785 Hyperlipidemia, unspecified: Secondary | ICD-10-CM | POA: Insufficient documentation

## 2016-10-10 DIAGNOSIS — Z720 Tobacco use: Secondary | ICD-10-CM | POA: Insufficient documentation

## 2016-10-17 ENCOUNTER — Encounter: Payer: Self-pay | Admitting: Pulmonary Disease

## 2016-10-17 ENCOUNTER — Ambulatory Visit (INDEPENDENT_AMBULATORY_CARE_PROVIDER_SITE_OTHER): Payer: Medicare Other | Admitting: Pulmonary Disease

## 2016-10-17 VITALS — BP 122/74 | HR 70 | Ht 61.0 in | Wt 108.0 lb

## 2016-10-17 DIAGNOSIS — J449 Chronic obstructive pulmonary disease, unspecified: Secondary | ICD-10-CM | POA: Diagnosis not present

## 2016-10-17 NOTE — Patient Instructions (Signed)
Continue the Symbicort and Spiriva You will be scheduled for a follow-up screening CT of the chest  Return to clinic in 3 months

## 2016-10-17 NOTE — Progress Notes (Signed)
Michele Martin    [id number]    22-Aug-1945  Primary Care Physician:James Jenny Reichmann, MD  Referring Physician: Biagio Borg, MD [place],  [id number]  Chief complaint:   Follow up for COPD GOLD D (CAT score 19, 4 exacerbations over the past year)  HPI:  71 year old with history of COPD, ex-smoker. She's had multiple exacerbations over the past year. She was last hospitalized in January for COPD exacerbation. She had a clear chest x-ray. She took the BiPAP briefly and improved with a prolonged steroid taper, antibiotics. Anxiety was also noted to be contributing to her dyspnea.  Since discharge she reports stable respiratory symptoms. She has chronic cough with sputum production, dyspnea with exertion. She denies any chest pain, palpitation, wheeze, fevers, chills. She has been maintained on Symbicort and Spiriva and albuterol. She reportedly quit smoking and is on "plastic cigarettes", likely e cigarettes. She has been ambulated during the past admission with no desaturations noted. She continues on a rehabilitation and exercise program at home.  Outpatient Encounter Prescriptions as of 10/17/2016  Medication Sig  . albuterol (PROVENTIL HFA) 108 (90 BASE) MCG/ACT inhaler Inhale 2 puffs into the lungs every 6 (six) hours as needed for wheezing or shortness of breath.  [name] aspirin 81 MG EC tablet Take 81 mg by mouth daily.    [name] atorvastatin (LIPITOR) 20 MG tablet TAKE 1 TABLET BY MOUTH ONCE DAILY (Patient taking differently: TAKE '20mg'$  TABLET BY MOUTH ONCE in the evening)  . B Complex Vitamins (B-COMPLEX/B-12 PO) Take by mouth daily.  . budesonide-formoterol (SYMBICORT) 160-4.5 MCG/ACT inhaler Inhale 2 puffs into the lungs 2 (two) times daily.  . clonazePAM (KLONOPIN) 0.5 MG tablet Take 1 tablet (0.5 mg total) by mouth 2 (two) times daily as needed for anxiety.  [name] dextromethorphan-guaiFENesin (MUCINEX DM) 30-600 MG 12hr tablet Take 2 tablets by mouth 2 (two) times  daily.  . diphenhydrAMINE (BENADRYL) 25 mg capsule Take 25 mg by mouth every 6 (six) hours as needed for allergies.  [name] donepezil (ARICEPT) 10 MG tablet Take 1 tablet daily (Patient taking differently: Take 10 mg by mouth at bedtime. )  . escitalopram (LEXAPRO) 20 MG tablet Take 0.5 tablets (10 mg total) by mouth daily.  . montelukast (SINGULAIR) 10 MG tablet TAKE 1 TABLET BY MOUTH EVERY NIGHT AT BEDTIME (Patient taking differently: TAKE '10mg'$  TABLET BY MOUTH EVERY NIGHT AT BEDTIME)  . SYMBICORT 160-4.5 MCG/ACT inhaler INHALE 2 PUFFS BY MOUTH TWICE DAILY  . tiotropium (SPIRIVA HANDIHALER) 18 MCG inhalation capsule Place 1 capsule (18 mcg total) into inhaler and inhale daily.   No facility-administered encounter medications on file as of 10/17/2016.     Allergies as of 10/17/2016 - Review Complete 10/17/2016  Allergen Reaction Noted  . Pravastatin Itching and Other (See Comments) 08/20/2010  . Simvastatin Itching and Other (See Comments) 09/19/2009  . Statins Itching and Other (See Comments) 10/14/2011    Past Medical History:  Diagnosis Date  . Acute angle-closure glaucoma 02/14/2010  . ALLERGIC RHINITIS 09/19/2009  . ANXIETY 04/15/2010  . Arthritis   . ASTHMA 09/19/2009  . BACK PAIN 02/14/2010  . Chronic bronchitis 04/14/2011  . CHRONIC OBSTRUCTIVE PULMONARY DISEASE, ACUTE EXACERBATION 12/19/2009  . Complication of anesthesia   . COPD 06/27/2009  . DEPRESSION 09/19/2009  . HOARSENESS 10/17/2009  . HYPERLIPIDEMIA 09/19/2009  . MENOPAUSAL DISORDER 08/20/2010  . OSTEOPENIA 09/10/2010  . Osteoporosis, unspecified 04/20/2014  . Rotator cuff tear  06/23/2011  . TINNITUS 09/10/2010  . TREMOR 02/14/2010  . Tremor 07/30/2011  . URI 09/10/2010  . Wheezing 06/27/2009    Past Surgical History:  Procedure Laterality Date  . ABDOMINAL HYSTERECTOMY    . cervical fusion 2013 - Dr Vertell Limber    . Normal Heart Cath  2001   Dr. Johnsie Cancel  . s/p laser tx for glaucoma     no vision loss    Family History  Problem  Relation Age of Onset  . Dementia Mother   . Rheum arthritis Maternal Uncle   . Colon cancer Neg Hx     Social History   Social History  . Marital status: Widowed    Spouse name: N/A  . Number of children: 2  . Years of education: N/A   Occupational History  . 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 Smoking status: Former Smoker    Packs/day: 1.00    Years: 40.00    Types: Cigarettes    Start date: 07/03/2015  . Smokeless tobacco: Never Used  . Alcohol use No  . Drug use: No  . Sexual activity: No   Other Topics Concern  . Not on file   Social History Narrative  . No narrative on file    Review of systems: Review of Systems  Constitutional: Negative for fever and chills.  HENT: Negative.   Eyes: Negative for blurred vision.  Respiratory: as per HPI  Cardiovascular: Negative for chest pain and palpitations.  Gastrointestinal: Negative for vomiting, diarrhea, blood per rectum. Genitourinary: Negative for dysuria, urgency, frequency and hematuria.  Musculoskeletal: Negative for myalgias, back pain and joint pain.  Skin: Negative for itching and rash.  Neurological: Negative for dizziness, tremors, focal weakness, seizures and loss of consciousness.  Endo/Heme/Allergies: Negative for environmental allergies.  Psychiatric/Behavioral: Negative for depression, suicidal ideas and hallucinations.  All other systems reviewed and are negative.  Physical Exam: Blood pressure 122/74, pulse 70, height '5\' 1"'$  (1.549 m), weight 108 lb (49 kg), SpO2 90 %. Gen:      No acute distress HEENT:  EOMI, sclera anicteric Neck:     No masses; no thyromegaly Lungs:    Clear to auscultation bilaterally; normal respiratory effort CV:         Regular rate and rhythm; no murmurs Abd:      + bowel sounds; soft, non-tender; no palpable masses, no distension Ext:    No edema; adequate peripheral perfusion Skin:      Warm and  dry; no rash Neuro: alert and oriented x 3 Psych: normal mood and affect  Data Reviewed: Screening CT chest 09/04/14-5.5 mm nodule in the right upper lobe, moderate to severe emphysematous changes Screening CT chest 10/24/15- resolved right upper lobe nodule, new groundglass opacity in the right lower lobe, moderate to severe asthmatic changes Chest x-ray 08/04/16-hyperinflation, no lung infiltrate I have reviewed all images personally  PFTs 04/20/13 FVC 1.71 [63%] FEV1 0.94 (46%) F/F 55 TLC 109% RV/TLC 148% DLCO 64% Severe obstructive avid disease with a bronchodilator response, trapping Moderate diffusion defect  Assessment:  COPD GOLD D She appears to have stable respiratory status after admission earlier this year. She's had frequent exacerbations in the past. We will continue the Symbicort and Spiriva for now along with albuterol rescue inhaler. She may be a candidate for daliresp if her exacerbations continue She is an exercise program at home and does not want formal pulmonary rehabilitation therapy She  was ambulated earlier this year with no desaturation. We will recheck ambulatory O2 levels at next visit visit.  Ex smoker She has quit smoking and is using E cigarettes.. Continue on low dose screening CT program  Plan/Recommendations: - Continue Symbicort, spiriva - Low dose screening CT  Marshell Garfinkel MD [facility] Pager [phone] 10/17/2016, 4:19 PM  CC: Biagio Borg, MD

## 2016-10-20 ENCOUNTER — Other Ambulatory Visit: Payer: Self-pay

## 2016-10-20 NOTE — Patient Outreach (Signed)
[facility]) Care Management  10/20/2016  Michele Martin 12-27-1945 [id number]   Telephonic Monthly Assessment  Program:  COPD 08/13/16 -   Subjective:  Patient reached and completed call.   [place]  [place] [address] [phone] [name] (M)   Providers: Primary MD:  Dr. Cathlean Cower  -  Last appt: 09/18/16 - next appt 12/16/16 (3 month follow-up) Pulmonologist:  Dr. Marshell Garfinkel - last appt:  10/17/16 - next appt 02/09/17 (3 month follow-up) Neurologist:  Dr. Earnestine Leys - next appt 04/17/17 (1 year follow-up)  HH:  PT services active  [place]:  Last appt -09/30/16 - next appt-  patient to call if needed.  Well Visit 09/30/16  Insurance:  [facility] Medicare   (verified by RN CM 08/21/2016)  Psycho/Social: Patient lives alone in her home. C/o weakness in legs with some improvement with HH PT services.  Did have recent home screen test per insurance nurse suggestive of LE vascular insufficiency and asking Dr. Jenny Reichmann for f/u testing. States recent PAC screening test indicated circulation issues in legs.  States Dr. Jenny Reichmann is making a new referral to follow-up.   Ambulating with cane and keeping walker close by if needed. Falls: none but high risk due to weakness in legs.  Emergency Contact:  Sister who lives 5 minutes away.   Transportation:  Sister or granddaughter.  MD has asked patient not to drive right now.  Advance Directives:  H/o Patient signed a Designated party release to allow her son, Kali Deadwyler, daughter [name] and granddaughter [name] , to have access to her medical records/information.   DME:  [name] with front wheels, built in shower seat, Nebulizer, scales, dentures, eyeglasses.   Co-morbidities:  HF, COPD, Anxiety, Protein-calorie malnutrition, severe Admission 08/04/2016 - 08/09/2016  Acute respiratory failure with hypoxia and hypercapnia   COPD Patient completed f/u with Dr. Jenny Reichmann  09/18/2016 for c/o 2-3 days of COPD symptoms.  -Symbicort Inhaler  twice a day -Spiriva Inhaler daily morning  -Albuterol Inhaler (using only every morning)  Advised may use every 6 hours -Nebulizer: Albuterol  (using only when symptoms are very bad).  RN CM advised may use Albuterol every 6 hours as needed.  Weight stable over the past month and remains at 103.  States weight will go up to 106 when ankles and feet start to swell but come back down with the Lasix.  Ensure one daily.  Pulmonology appt completed on 10/17/16: states no changes in medications.     Medications:  Prevnar (PCV13) 07/19/2013 Pneumovax (PP 06/27/2009 Flu Vaccine 05/15/2016 tDAP Vaccine 07/30/2009  Encounter Medications:  Outpatient Encounter Prescriptions as of 10/20/2016  Medication Sig  . albuterol (PROVENTIL HFA) 108 (90 BASE) MCG/ACT inhaler Inhale 2 puffs into the lungs every 6 (six) hours as needed for wheezing or shortness of breath.  [name] aspirin 81 MG EC tablet Take 81 mg by mouth daily.    [name] atorvastatin (LIPITOR) 20 MG tablet TAKE 1 TABLET BY MOUTH ONCE DAILY (Patient taking differently: TAKE 99m TABLET BY MOUTH ONCE in the evening)  . B Complex Vitamins (B-COMPLEX/B-12 PO) Take by mouth daily.  . budesonide-formoterol (SYMBICORT) 160-4.5 MCG/ACT inhaler Inhale 2 puffs into the lungs 2 (two) times daily.  . clonazePAM (KLONOPIN) 0.5 MG tablet Take 1 tablet (0.5 mg total) by mouth 2 (two) times daily as needed for anxiety.  .Marland Kitchendextromethorphan-guaiFENesin (MUCINEX DM) 30-600 MG 12hr tablet Take 2 tablets by mouth  2 (two) times daily.  . diphenhydrAMINE (BENADRYL) 25 mg capsule Take 25 mg by mouth every 6 (six) hours as needed for allergies.  [name] donepezil (ARICEPT) 10 MG tablet Take 1 tablet daily (Patient taking differently: Take 10 mg by mouth at bedtime. )  . escitalopram (LEXAPRO) 20 MG tablet Take 0.5 tablets (10 mg total) by mouth daily.  . montelukast (SINGULAIR) 10 MG tablet TAKE 1 TABLET BY MOUTH EVERY  NIGHT AT BEDTIME (Patient taking differently: TAKE 70m TABLET BY MOUTH EVERY NIGHT AT BEDTIME)  . SYMBICORT 160-4.5 MCG/ACT inhaler INHALE 2 PUFFS BY MOUTH TWICE DAILY  . tiotropium (SPIRIVA HANDIHALER) 18 MCG inhalation capsule Place 1 capsule (18 mcg total) into inhaler and inhale daily.   No facility-administered encounter medications on file as of 10/20/2016.     Functional Status:  In your present state of health, do you have any difficulty performing the following activities: 09/30/2016 08/21/2016  Hearing? N N  Vision? N N  Difficulty concentrating or making decisions? N N  Walking or climbing stairs? N Y  Dressing or bathing? N N  Doing errands, shopping? N N  Preparing Food and eating ? N N  Using the Toilet? N N  In the past six months, have you accidently leaked urine? N N  Do you have problems with loss of bowel control? N N  Managing your Medications? N N  Managing your Finances? N N  Housekeeping or managing your Housekeeping? N N  Some recent data might be hidden    Fall/Depression Screening: PHQ 2/9 Scores 09/30/2016 09/30/2016 09/22/2016 08/21/2016 06/25/2016 08/14/2015 08/02/2014  PHQ - 2 Score 2 1 0 0 1 1 0  PHQ- 9 Score 6 - - - - - -   Fall Risk  09/30/2016 09/30/2016 09/22/2016 08/21/2016 06/25/2016  Falls in the past year? Yes Yes No No No  Number falls in past yr: 2 or more - - - -  Injury with Fall? No - - - -  Risk Factor Category  - - - - -  Risk for fall due to : Impaired balance/gait;Impaired mobility Impaired balance/gait;Impaired mobility;Impaired vision Impaired balance/gait;Impaired mobility Impaired mobility -  Follow up Falls prevention discussed;Education provided - - - -    Plan:  [facility] COPD Program 08/13/16 - 08/13/16 Screening and Initial Assessment 08/21/16 Transition of Care: (Short Term)  08/13/16 - [facility] service [facility]:  08/13/16 Program:  COPD 08/13/16 - 10/20/2016  TArmstrongCoach Referral:  10/20/16  Patient agreed to continue  [facility] with Health Coach.    COPD RN CM encouraged to take medications as ordered and report any change in condition or symptoms to MD.  RN CM discussed fall prevention and advised to use walker during this time.  RN CM encouraged ensure as tolerated and increase caloric intake as much as possible. RN CM will continue to follow outcome of LE arterial doppler study and fall risk.  Patient states outcome still pending.    [facility] Educational Materials (mailed 08/21/2016 - reviewed 09/22/16, 10/20/2016) -COPD:  Medication  -COPD:  When To Get Help -Inhalers:  Using Them The Right Way -Your Lungs -COPD Action Plan Magnet  RN CM scheduled for next contact call within one month for [place]  RN CM advised to please notify MD of any changes in condition prior to scheduled appt's.   RN CM provided contact name and # [phone], [phone] or main office # [phone] 24-hour nurse  line # [phone]. RN CM confirmed patient is aware of 911 services for urgent emergency needs.  THN CM Care Plan Problem One     Most Recent Value  Care Plan Problem One  Admission over the last 30 days for COPD management.   Role Documenting the Problem One  Care Management Telephonic Coordinator  Care Plan for Problem One  Active  THN CM Short Term Goal #2 (0-30 days)  Patient will report any change in COPD symptoms to her MD to avoid need for hospital admissions over the next 30 days.   THN CM Short Term Goal #2 Start Date  09/22/16  [facility] CM Short Term Goal #2 Met Date  10/20/16  Interventions for Short Term Goal #2  RN CM encouraged patient to seek MD intervention for management of symptoms over the next 30 days.     [facility] CM Care Plan Problem Three     Most Recent Value  Care Plan Problem Three  Advance Directive:  None   Role Documenting the Problem Three  Care Management Telephonic Coordinator  Care Plan for Problem Three  Active  THN Long Term Goal (31-90) days   Patient will complete Advance Directive over the next 31-90 days.   THN Long Term Goal Start Date  08/27/16  Interventions for Problem Three Long Term Goal  RN CM will monitor for education and guidance needs ove the next 31-90 days.   THN CM Short Term Goal #1 (0-30 days)  Patient will engage in Advance Directive discussion with RN CM over the next 30 days.   THN CM Short Term Goal #1 Start Date  09/22/16  [facility] CM Short Term Goal #1 Met Date  10/20/16  Interventions for Short Term Goal #1  RN CM will initiat Advance Directive conversation with patient over the next 30 days.        Nathaneil Canary, BSN, RN, [facility] Care Management Care Management Coordinator [phone] Direct [phone] Cell [phone] Office [phone] Fax [email or web address]

## 2016-10-22 ENCOUNTER — Encounter: Payer: Self-pay | Admitting: *Deleted

## 2016-10-22 DIAGNOSIS — R0902 Hypoxemia: Secondary | ICD-10-CM | POA: Diagnosis not present

## 2016-10-22 DIAGNOSIS — J449 Chronic obstructive pulmonary disease, unspecified: Secondary | ICD-10-CM | POA: Diagnosis not present

## 2016-10-28 ENCOUNTER — Other Ambulatory Visit (INDEPENDENT_AMBULATORY_CARE_PROVIDER_SITE_OTHER): Payer: Medicare Other

## 2016-10-28 ENCOUNTER — Ambulatory Visit (INDEPENDENT_AMBULATORY_CARE_PROVIDER_SITE_OTHER): Payer: Medicare Other | Admitting: Internal Medicine

## 2016-10-28 ENCOUNTER — Encounter: Payer: Self-pay | Admitting: Internal Medicine

## 2016-10-28 VITALS — BP 126/74 | HR 67 | Temp 98.0°F | Ht 61.0 in | Wt 107.0 lb

## 2016-10-28 DIAGNOSIS — R5383 Other fatigue: Secondary | ICD-10-CM | POA: Diagnosis not present

## 2016-10-28 DIAGNOSIS — R609 Edema, unspecified: Secondary | ICD-10-CM

## 2016-10-28 DIAGNOSIS — J449 Chronic obstructive pulmonary disease, unspecified: Secondary | ICD-10-CM | POA: Diagnosis not present

## 2016-10-28 DIAGNOSIS — R7989 Other specified abnormal findings of blood chemistry: Secondary | ICD-10-CM

## 2016-10-28 DIAGNOSIS — R946 Abnormal results of thyroid function studies: Secondary | ICD-10-CM | POA: Diagnosis not present

## 2016-10-28 LAB — HEPATIC FUNCTION PANEL
ALBUMIN: 3.6 g/dL (ref 3.5–5.2)
ALT: 11 U/L (ref 0–35)
AST: 15 U/L (ref 0–37)
Alkaline Phosphatase: 43 U/L (ref 39–117)
Bilirubin, Direct: 0 mg/dL (ref 0.0–0.3)
TOTAL PROTEIN: 5.8 g/dL — AB (ref 6.0–8.3)
Total Bilirubin: 0.3 mg/dL (ref 0.2–1.2)

## 2016-10-28 LAB — CBC WITH DIFFERENTIAL/PLATELET
BASOS ABS: 0 10*3/uL (ref 0.0–0.1)
Basophils Relative: 0.6 % (ref 0.0–3.0)
EOS PCT: 1.3 % (ref 0.0–5.0)
Eosinophils Absolute: 0.1 10*3/uL (ref 0.0–0.7)
HCT: 41.3 % (ref 36.0–46.0)
HEMOGLOBIN: 13.7 g/dL (ref 12.0–15.0)
LYMPHS ABS: 1.1 10*3/uL (ref 0.7–4.0)
Lymphocytes Relative: 13.5 % (ref 12.0–46.0)
MCHC: 33.1 g/dL (ref 30.0–36.0)
MCV: 99 fl (ref 78.0–100.0)
MONO ABS: 0.7 10*3/uL (ref 0.1–1.0)
Monocytes Relative: 8.6 % (ref 3.0–12.0)
NEUTROS PCT: 76 % (ref 43.0–77.0)
Neutro Abs: 6.1 10*3/uL (ref 1.4–7.7)
Platelets: 226 10*3/uL (ref 150.0–400.0)
RBC: 4.17 Mil/uL (ref 3.87–5.11)
RDW: 13.4 % (ref 11.5–15.5)
WBC: 8 10*3/uL (ref 4.0–10.5)

## 2016-10-28 LAB — BASIC METABOLIC PANEL
BUN: 13 mg/dL (ref 6–23)
CALCIUM: 9.4 mg/dL (ref 8.4–10.5)
CO2: 34 meq/L — AB (ref 19–32)
Chloride: 105 mEq/L (ref 96–112)
Creatinine, Ser: 0.47 mg/dL (ref 0.40–1.20)
GFR: 138.38 mL/min (ref >60.00)
GLUCOSE: 82 mg/dL (ref 70–99)
Potassium: 4 mEq/L (ref 3.5–5.1)
Sodium: 144 mEq/L (ref 135–145)

## 2016-10-28 LAB — TSH: TSH: 0.55 u[IU]/mL (ref 0.35–4.50)

## 2016-10-28 LAB — T4, FREE: FREE T4: 0.83 ng/dL (ref 0.60–1.60)

## 2016-10-28 MED ORDER — HYDROCHLOROTHIAZIDE 25 MG PO TABS: 25.0000 mg | ORAL_TABLET | Freq: Every day | ORAL | 3 refills | Status: DC

## 2016-10-28 MED ORDER — TIOTROPIUM BROMIDE MONOHYDRATE 18 MCG IN CAPS: 18.0000 ug | ORAL_CAPSULE | Freq: Every day | RESPIRATORY_TRACT | 3 refills | Status: DC

## 2016-10-28 NOTE — Progress Notes (Signed)
Pre visit review using our clinic review tool, if applicable. No additional management support is needed unless otherwise documented below in the visit note. 

## 2016-10-28 NOTE — Assessment & Plan Note (Signed)
?   sick euthyroid vs true hyperthyoid, for f/u TFTs today

## 2016-10-28 NOTE — Assessment & Plan Note (Signed)
Suspect venous insuff vs diastolic dysfxn as no pulm HTN found on recent echo oct 2017 and normal EF; for restart hct at 25 qd

## 2016-10-28 NOTE — Progress Notes (Signed)
Subjective:    Patient ID: Michele Martin, female    DOB: 11/10/1945, 71 y.o.   MRN: [id number]  HPI  Here to f/u; overall doing ok,  Pt denies chest pain, increasing sob or doe, wheezing, orthopnea, PND, increased LE swelling, palpitations, dizziness or syncope, but does have persistent mild leg swelling no change, persistent fatigue without daytime somnolence, and baseline breathing..  Pt denies new neurological symptoms such as new headache, or facial or extremity weakness or numbness.   Pt states overall good compliance with meds  Pt denies fever, wt loss, night sweats, loss of appetite, or other constitutional symptoms  Denies hyper or hypo thyroid symptoms such as voice, skin or hair change.  No other new history Past Medical History:  Diagnosis Date  . Acute angle-closure glaucoma 02/14/2010  . ALLERGIC RHINITIS 09/19/2009  . ANXIETY 04/15/2010  . Arthritis   . ASTHMA 09/19/2009  . BACK PAIN 02/14/2010  . Chronic bronchitis 04/14/2011  . CHRONIC OBSTRUCTIVE PULMONARY DISEASE, ACUTE EXACERBATION 12/19/2009  . Complication of anesthesia   . COPD 06/27/2009  . DEPRESSION 09/19/2009  . HOARSENESS 10/17/2009  . HYPERLIPIDEMIA 09/19/2009  . MENOPAUSAL DISORDER 08/20/2010  . OSTEOPENIA 09/10/2010  . Osteoporosis, unspecified 04/20/2014  . Rotator cuff tear 06/23/2011  . TINNITUS 09/10/2010  . TREMOR 02/14/2010  . Tremor 07/30/2011  . URI 09/10/2010  . Wheezing 06/27/2009   Past Surgical History:  Procedure Laterality Date  . ABDOMINAL HYSTERECTOMY    . cervical fusion 2013 - Dr Vertell Limber    . Normal Heart Cath  2001   Dr. Johnsie Cancel  . s/p laser tx for glaucoma     no vision loss    reports that she has quit smoking. Her smoking use included Cigarettes. She started smoking about 16 months ago. She has a 40.00 pack-year smoking history. She has never used smokeless tobacco. She reports that she does not drink alcohol or use drugs. family history includes Dementia in her mother; Rheum arthritis in  her maternal uncle. Allergies  Allergen Reactions  . Pravastatin Itching and Other (See Comments)    Myalgias/cramping  . Simvastatin Itching and Other (See Comments)    Myalgias/cramping  . Statins Itching and Other (See Comments)    Myalgias/cramping   Current Outpatient Prescriptions on File Prior to Visit  Medication Sig Dispense Refill  . albuterol (PROVENTIL HFA) 108 (90 BASE) MCG/ACT inhaler Inhale 2 puffs into the lungs every 6 (six) hours as needed for wheezing or shortness of breath. 18 g 5  . aspirin 81 MG EC tablet Take 81 mg by mouth daily.      [name] atorvastatin (LIPITOR) 20 MG tablet TAKE 1 TABLET BY MOUTH ONCE DAILY (Patient taking differently: TAKE '20mg'$  TABLET BY MOUTH ONCE in the evening) 90 tablet 3  . B Complex Vitamins (B-COMPLEX/B-12 PO) Take by mouth daily.    . budesonide-formoterol (SYMBICORT) 160-4.5 MCG/ACT inhaler Inhale 2 puffs into the lungs 2 (two) times daily. 3 Inhaler 3  . clonazePAM (KLONOPIN) 0.5 MG tablet Take 1 tablet (0.5 mg total) by mouth 2 (two) times daily as needed for anxiety. 60 tablet 5  . dextromethorphan-guaiFENesin (MUCINEX DM) 30-600 MG 12hr tablet Take 2 tablets by mouth 2 (two) times daily. 60 tablet 0  . diphenhydrAMINE (BENADRYL) 25 mg capsule Take 25 mg by mouth every 6 (six) hours as needed for allergies.    [name] donepezil (ARICEPT) 10 MG tablet Take 1 tablet daily (Patient taking differently: Take 10 mg by mouth at  bedtime. ) 90 tablet 3  . escitalopram (LEXAPRO) 20 MG tablet Take 0.5 tablets (10 mg total) by mouth daily. 45 tablet 3  . montelukast (SINGULAIR) 10 MG tablet TAKE 1 TABLET BY MOUTH EVERY NIGHT AT BEDTIME (Patient taking differently: TAKE '10mg'$  TABLET BY MOUTH EVERY NIGHT AT BEDTIME) 30 tablet 11  . SYMBICORT 160-4.5 MCG/ACT inhaler INHALE 2 PUFFS BY MOUTH TWICE DAILY 30.6 g 0   No current facility-administered medications on file prior to visit.    Review of Systems  Constitutional: Negative for other unusual diaphoresis or  sweats HENT: Negative for ear discharge or swelling Eyes: Negative for other worsening visual disturbances Respiratory: Negative for stridor or other swelling  Gastrointestinal: Negative for worsening distension or other blood Genitourinary: Negative for retention or other urinary change Musculoskeletal: Negative for other MSK pain or swelling Skin: Negative for color change or other new lesions Neurological: Negative for worsening tremors and other numbness  Psychiatric/Behavioral: Negative for worsening agitation or other fatigue All other system neg per pt    Objective:   Physical Exam BP 126/74   Pulse 67   Temp 98 F (36.7 C) (Oral)   Ht '5\' 1"'$  (1.549 m)   Wt 107 lb (48.5 kg)   SpO2 98%   BMI 20.22 kg/m  VS noted,  Constitutional: Pt appears in NAD HENT: Head: NCAT.  Right Ear: External ear normal.  Left Ear: External ear normal.  Eyes: . Pupils are equal, round, and reactive to light. Conjunctivae and EOM are normal Nose: without d/c or deformity Neck: Neck supple. Gross normal ROM Cardiovascular: Normal rate and regular rhythm.   Pulmonary/Chest: Effort normal and breath sounds decreased without rales or wheezing.  Abd:  Soft, NT, ND, + BS, no organomegaly Neurological: Pt is alert. At baseline orientation, motor grossly intact Skin: Skin is warm. No rashes, other new lesions Psychiatric: Pt behavior is normal without agitation  bilat ankle edema left > right No other exam findings Lab Results  Component Value Date   WBC 15.1 (H) 08/06/2016   HGB 15.0 08/06/2016   HCT 45.3 08/06/2016   PLT 181 08/06/2016   GLUCOSE 134 (H) 08/06/2016   CHOL 222 (H) 08/09/2015   TRIG 95.0 08/09/2015   HDL 89.50 08/09/2015   LDLDIRECT 164.4 07/19/2013   LDLCALC 114 (H) 08/09/2015   ALT 25 08/05/2016   AST 21 08/05/2016   NA 138 08/06/2016   K 4.1 08/06/2016   CL 98 (L) 08/06/2016   CREATININE 0.52 08/06/2016   BUN 37 (H) 08/06/2016   CO2 33 (H) 08/06/2016   TSH 0.104 (L)  08/06/2016   HGBA1C 5.6 08/01/2011      Assessment & Plan:

## 2016-10-28 NOTE — Patient Instructions (Signed)
OK to restart the fluid pill at the 25 mg per day  Please continue all other medications as before, and refills have been done if requested.  Please have the pharmacy call with any other refills you may need.  Please keep your appointments with your specialists as you may have planned  Please go to the LAB in the Basement (turn left off the elevator) for the tests to be done today  You will be contacted by phone if any changes need to be made immediately.  Otherwise, you will receive a letter about your results with an explanation, but please check with MyChart first.  Please remember to sign up for MyChart if you have not done so, as this will be important to you in the future with finding out test results, communicating by private email, and scheduling acute appointments online when needed.

## 2016-11-03 NOTE — Assessment & Plan Note (Signed)
Etiology unclear, Etiology unclear, Exam otherwise benign, to check labs as documented, follow with expectant management

## 2016-11-03 NOTE — Assessment & Plan Note (Signed)
Currently stable, stable overall by history and exam, and pt to continue medical treatment as before,  to f/u any worsening symptoms or concerns

## 2016-11-04 DIAGNOSIS — H40003 Preglaucoma, unspecified, bilateral: Secondary | ICD-10-CM | POA: Diagnosis not present

## 2016-11-04 DIAGNOSIS — H40013 Open angle with borderline findings, low risk, bilateral: Secondary | ICD-10-CM | POA: Diagnosis not present

## 2016-11-04 DIAGNOSIS — H18413 Arcus senilis, bilateral: Secondary | ICD-10-CM | POA: Diagnosis not present

## 2016-11-04 DIAGNOSIS — H04123 Dry eye syndrome of bilateral lacrimal glands: Secondary | ICD-10-CM | POA: Diagnosis not present

## 2016-11-04 DIAGNOSIS — H3589 Other specified retinal disorders: Secondary | ICD-10-CM | POA: Diagnosis not present

## 2016-11-11 ENCOUNTER — Ambulatory Visit (INDEPENDENT_AMBULATORY_CARE_PROVIDER_SITE_OTHER)
Admission: RE | Admit: 2016-11-11 | Discharge: 2016-11-11 | Disposition: A | Discharge status: Home / Self Care | Payer: Medicare Other | Source: Ambulatory Visit | Attending: Acute Care | Admitting: Acute Care

## 2016-11-11 DIAGNOSIS — F17210 Nicotine dependence, cigarettes, uncomplicated: Secondary | ICD-10-CM

## 2016-11-11 DIAGNOSIS — Z87891 Personal history of nicotine dependence: Secondary | ICD-10-CM | POA: Diagnosis not present

## 2016-11-11 DIAGNOSIS — C3490 Malignant neoplasm of unspecified part of unspecified bronchus or lung: Secondary | ICD-10-CM | POA: Diagnosis not present

## 2016-11-13 ENCOUNTER — Ambulatory Visit (INDEPENDENT_AMBULATORY_CARE_PROVIDER_SITE_OTHER): Payer: Medicare Other | Admitting: Internal Medicine

## 2016-11-13 ENCOUNTER — Encounter: Payer: Self-pay | Admitting: Internal Medicine

## 2016-11-13 VITALS — BP 132/78 | HR 74 | Wt 104.0 lb

## 2016-11-13 DIAGNOSIS — J441 Chronic obstructive pulmonary disease with (acute) exacerbation: Secondary | ICD-10-CM

## 2016-11-13 DIAGNOSIS — R05 Cough: Secondary | ICD-10-CM | POA: Diagnosis not present

## 2016-11-13 DIAGNOSIS — R059 Cough, unspecified: Secondary | ICD-10-CM

## 2016-11-13 MED ORDER — METHYLPREDNISOLONE ACETATE 80 MG/ML IJ SUSP
80.0000 mg | Freq: Once | INTRAMUSCULAR | Status: AC
  Administered 2016-11-13: 80 mg via INTRAMUSCULAR

## 2016-11-13 MED ORDER — PREDNISONE 10 MG PO TABS: 10.0000 mg | ORAL_TABLET | Freq: Every day | ORAL | 0 refills | Status: DC

## 2016-11-13 MED ORDER — ALBUTEROL SULFATE HFA 108 (90 BASE) MCG/ACT IN AERS: 2.0000 | INHALATION_SPRAY | Freq: Four times a day (QID) | RESPIRATORY_TRACT | 5 refills | Status: DC | PRN

## 2016-11-13 MED ORDER — LEVOFLOXACIN 250 MG PO TABS: 250.0000 mg | ORAL_TABLET | Freq: Every day | ORAL | 0 refills | Status: AC

## 2016-11-13 MED ORDER — PREDNISONE 10 MG PO TABS: ORAL_TABLET | ORAL | 0 refills | Status: DC

## 2016-11-13 MED ORDER — HYDROCODONE-HOMATROPINE 5-1.5 MG/5ML PO SYRP: 5.0000 mL | ORAL_SOLUTION | Freq: Four times a day (QID) | ORAL | 0 refills | Status: DC | PRN

## 2016-11-13 MED ORDER — PREDNISONE 10 MG PO TABS
ORAL_TABLET | ORAL | 0 refills | Status: DC
Start: 2016-11-13 — End: 2016-12-05

## 2016-11-13 MED ORDER — HYDROCODONE-HOMATROPINE 5-1.5 MG/5ML PO SYRP: 5.0000 mL | ORAL_SOLUTION | Freq: Four times a day (QID) | ORAL | 0 refills | Status: AC | PRN

## 2016-11-13 NOTE — Progress Notes (Signed)
Subjective:    Patient ID: SAMEERA BETTON, female    DOB: 05-19-1946, 71 y.o.   MRN: [id number]  HPI  Here with acute onset mild to mod 2-3 days ST, HA, general weakness and malaise, with prod cough greenish sputum, but Pt denies chest pain, increased sob or doe, wheezing, orthopnea, PND, increased LE swelling, palpitations, dizziness or syncope, except for onset mild to mod wheezing in last day.  Pt denies new neurological symptoms such as new headache, or facial or extremity weakness or numbness   Pt denies polydipsia, polyuria Past Medical History:  Diagnosis Date  . Acute angle-closure glaucoma 02/14/2010  . ALLERGIC RHINITIS 09/19/2009  . ANXIETY 04/15/2010  . Arthritis   . ASTHMA 09/19/2009  . BACK PAIN 02/14/2010  . Chronic bronchitis 04/14/2011  . CHRONIC OBSTRUCTIVE PULMONARY DISEASE, ACUTE EXACERBATION 12/19/2009  . Complication of anesthesia   . COPD 06/27/2009  . DEPRESSION 09/19/2009  . HOARSENESS 10/17/2009  . HYPERLIPIDEMIA 09/19/2009  . MENOPAUSAL DISORDER 08/20/2010  . OSTEOPENIA 09/10/2010  . Osteoporosis, unspecified 04/20/2014  . Rotator cuff tear 06/23/2011  . TINNITUS 09/10/2010  . TREMOR 02/14/2010  . Tremor 07/30/2011  . URI 09/10/2010  . Wheezing 06/27/2009   Past Surgical History:  Procedure Laterality Date  . ABDOMINAL HYSTERECTOMY    . cervical fusion 2013 - Dr Vertell Limber    . Normal Heart Cath  2001   Dr. Johnsie Cancel  . s/p laser tx for glaucoma     no vision loss    reports that she has quit smoking. Her smoking use included Cigarettes. She started smoking about 16 months ago. She has a 40.00 pack-year smoking history. She has never used smokeless tobacco. She reports that she does not drink alcohol or use drugs. family history includes Dementia in her mother; Rheum arthritis in her maternal uncle. Allergies  Allergen Reactions  . Pravastatin Itching and Other (See Comments)    Myalgias/cramping  . Simvastatin Itching and Other (See Comments)    Myalgias/cramping    . Statins Itching and Other (See Comments)    Myalgias/cramping   Current Outpatient Prescriptions on File Prior to Visit  Medication Sig Dispense Refill  . aspirin 81 MG EC tablet Take 81 mg by mouth daily.      [name] atorvastatin (LIPITOR) 20 MG tablet TAKE 1 TABLET BY MOUTH ONCE DAILY (Patient taking differently: TAKE '20mg'$  TABLET BY MOUTH ONCE in the evening) 90 tablet 3  . B Complex Vitamins (B-COMPLEX/B-12 PO) Take by mouth daily.    . budesonide-formoterol (SYMBICORT) 160-4.5 MCG/ACT inhaler Inhale 2 puffs into the lungs 2 (two) times daily. 3 Inhaler 3  . clonazePAM (KLONOPIN) 0.5 MG tablet Take 1 tablet (0.5 mg total) by mouth 2 (two) times daily as needed for anxiety. 60 tablet 5  . dextromethorphan-guaiFENesin (MUCINEX DM) 30-600 MG 12hr tablet Take 2 tablets by mouth 2 (two) times daily. 60 tablet 0  . diphenhydrAMINE (BENADRYL) 25 mg capsule Take 25 mg by mouth every 6 (six) hours as needed for allergies.    [name] donepezil (ARICEPT) 10 MG tablet Take 1 tablet daily (Patient taking differently: Take 10 mg by mouth at bedtime. ) 90 tablet 3  . escitalopram (LEXAPRO) 20 MG tablet Take 0.5 tablets (10 mg total) by mouth daily. 45 tablet 3  . hydrochlorothiazide (HYDRODIURIL) 25 MG tablet Take 1 tablet (25 mg total) by mouth daily. 90 tablet 3  . montelukast (SINGULAIR) 10 MG tablet TAKE 1 TABLET BY MOUTH EVERY NIGHT AT BEDTIME (Patient  taking differently: TAKE '10mg'$  TABLET BY MOUTH EVERY NIGHT AT BEDTIME) 30 tablet 11  . SYMBICORT 160-4.5 MCG/ACT inhaler INHALE 2 PUFFS BY MOUTH TWICE DAILY 30.6 g 0  . tiotropium (SPIRIVA HANDIHALER) 18 MCG inhalation capsule Place 1 capsule (18 mcg total) into inhaler and inhale daily. 90 capsule 3  . albuterol (PROVENTIL HFA;VENTOLIN HFA) 108 (90 BASE) MCG/ACT inhaler Inhale 2 puffs into the lungs every 6 (six) hours as needed for wheezing. 1 Inhaler 11   No current facility-administered medications on file prior to visit.    Review of Systems   Constitutional: Negative for other unusual diaphoresis or sweats HENT: Negative for ear discharge or swelling Eyes: Negative for other worsening visual disturbances Respiratory: Negative for stridor or other swelling  Gastrointestinal: Negative for worsening distension or other blood Genitourinary: Negative for retention or other urinary change Musculoskeletal: Negative for other MSK pain or swelling Skin: Negative for color change or other new lesions Neurological: Negative for worsening tremors and other numbness  Psychiatric/Behavioral: Negative for worsening agitation or other fatigue All other system neg per pt    Objective:   Physical Exam BP 132/78   Pulse 74   Wt 104 lb (47.2 kg)   SpO2 (!) 89%   BMI 19.65 kg/m  VS noted, mod ill appearing Constitutional: Pt appears in NAD HENT: Head: NCAT.  Right Ear: External ear normal.  Left Ear: External ear normal.  Eyes: . Pupils are equal, round, and reactive to light. Conjunctivae and EOM are normal Nose: without d/c or deformity Neck: Neck supple. Gross normal ROM Cardiovascular: Normal rate and regular rhythm.   Pulmonary/Chest: mild increased effort,and breath sounds decreased without rales but with bilat mod wheezing throughout.  Neurological: Pt is alert. At baseline orientation, motor grossly intact Skin: Skin is warm. No rashes, other new lesions, no LE edema Psychiatric: Pt behavior is normal without agitation  No other exam findings    Assessment & Plan:

## 2016-11-13 NOTE — Progress Notes (Signed)
Pre visit review using our clinic review tool, if applicable. No additional management support is needed unless otherwise documented below in the visit note. 

## 2016-11-13 NOTE — Patient Instructions (Addendum)
You had the steroid shot today  .Please take all new medication as prescribed - the antibiotic, cough medicine, and prednisone  Your CT chest did not show pneumonia yesterday  Please continue all other medications as before, and refills have been done if requested.  Please have the pharmacy call with any other refills you may need.  Please keep your appointments with your specialists as you may have planned

## 2016-11-16 NOTE — Assessment & Plan Note (Signed)
Mild to mod, for cxr, c/w bronchitis vs pna, for antibx course, cough med prn,  to f/u any worsening symptoms or concerns

## 2016-11-16 NOTE — Assessment & Plan Note (Signed)
Mild to mod, for depomedrol IM 80, predpac asd, to f/u any worsening symptoms or concerns 

## 2016-11-19 ENCOUNTER — Other Ambulatory Visit: Payer: Self-pay | Admitting: Acute Care

## 2016-11-19 DIAGNOSIS — Z87891 Personal history of nicotine dependence: Secondary | ICD-10-CM

## 2016-11-20 ENCOUNTER — Other Ambulatory Visit: Payer: Self-pay | Admitting: *Deleted

## 2016-11-20 ENCOUNTER — Encounter: Payer: Self-pay | Admitting: *Deleted

## 2016-11-20 DIAGNOSIS — J441 Chronic obstructive pulmonary disease with (acute) exacerbation: Secondary | ICD-10-CM

## 2016-11-20 NOTE — Addendum Note (Signed)
Addended by: Verlin Grills on: 11/20/2016 03:43 PM   Modules accepted: Orders

## 2016-11-20 NOTE — Patient Outreach (Addendum)
Michele Martin) Care Management  11/20/2016   Michele Martin 08-30-1945 [id number]  [place] telephone call to patient.  [employer] compliance verified. Per patient she has been sick. Patient has been on po antibiotics. She has a productive cough with mucous now changing to clear from green. Patient still has a couple more days of the antibiotics. Patient is short of breath on exertion. She wears oxygen at night. Patient weighs 104. She likes ensure and boost but stated it costs alot on her limited income. Per patient she is on Spiriva but is going into the donut hole. She had applied for assistance but never heard back. Patient O2 sat is 95 % on room air today. Patient has agreed to follow up outreach calls.   Objective:   Current Medications:  Current Outpatient Prescriptions  Medication Sig Dispense Refill  . albuterol (PROVENTIL HFA) 108 (90 Base) MCG/ACT inhaler Inhale 2 puffs into the lungs every 6 (six) hours as needed for wheezing or shortness of breath. 18 g 5  . albuterol (PROVENTIL HFA;VENTOLIN HFA) 108 (90 BASE) MCG/ACT inhaler Inhale 2 puffs into the lungs every 6 (six) hours as needed for wheezing. 1 Inhaler 11  . aspirin 81 MG EC tablet Take 81 mg by mouth daily.      [name] atorvastatin (LIPITOR) 20 MG tablet TAKE 1 TABLET BY MOUTH ONCE DAILY (Patient taking differently: TAKE '20mg'$  TABLET BY MOUTH ONCE in the evening) 90 tablet 3  . B Complex Vitamins (B-COMPLEX/B-12 PO) Take by mouth daily.    . budesonide-formoterol (SYMBICORT) 160-4.5 MCG/ACT inhaler Inhale 2 puffs into the lungs 2 (two) times daily. 3 Inhaler 3  . clonazePAM (KLONOPIN) 0.5 MG tablet Take 1 tablet (0.5 mg total) by mouth 2 (two) times daily as needed for anxiety. 60 tablet 5  . dextromethorphan-guaiFENesin (MUCINEX DM) 30-600 MG 12hr tablet Take 2 tablets by mouth 2 (two) times daily. 60 tablet 0  . diphenhydrAMINE (BENADRYL) 25 mg capsule Take 25 mg by mouth every 6 (six) hours as needed for  allergies.    [name] donepezil (ARICEPT) 10 MG tablet Take 1 tablet daily (Patient taking differently: Take 10 mg by mouth at bedtime. ) 90 tablet 3  . escitalopram (LEXAPRO) 20 MG tablet Take 0.5 tablets (10 mg total) by mouth daily. 45 tablet 3  . hydrochlorothiazide (HYDRODIURIL) 25 MG tablet Take 1 tablet (25 mg total) by mouth daily. 90 tablet 3  . HYDROcodone-homatropine (HYCODAN) 5-1.5 MG/5ML syrup Take 5 mLs by mouth every 6 (six) hours as needed for cough. 180 mL 0  . levofloxacin (LEVAQUIN) 250 MG tablet Take 1 tablet (250 mg total) by mouth daily. 10 tablet 0  . montelukast (SINGULAIR) 10 MG tablet TAKE 1 TABLET BY MOUTH EVERY NIGHT AT BEDTIME (Patient taking differently: TAKE '10mg'$  TABLET BY MOUTH EVERY NIGHT AT BEDTIME) 30 tablet 11  . predniSONE (DELTASONE) 10 MG tablet 3 tabs by mouth per day for 3 days,2tabs per day for 3 days,1tab per day for 3 days 18 tablet 0  . SYMBICORT 160-4.5 MCG/ACT inhaler INHALE 2 PUFFS BY MOUTH TWICE DAILY 30.6 g 0  . tiotropium (SPIRIVA HANDIHALER) 18 MCG inhalation capsule Place 1 capsule (18 mcg total) into inhaler and inhale daily. 90 capsule 3   No current facility-administered medications for this visit.     Functional Status:  In your present state of health, do you have any difficulty performing the following activities: 11/20/2016 09/30/2016  Hearing? N N  Vision? N N  Difficulty concentrating or making decisions? N N  Walking or climbing stairs? Y N  Dressing or bathing? N N  Doing errands, shopping? Y N  Preparing Food and eating ? N N  Using the Toilet? N N  In the past six months, have you accidently leaked urine? N N  Do you have problems with loss of bowel control? N N  Managing your Medications? N N  Managing your Finances? N N  Housekeeping or managing your Housekeeping? Y N  Some recent data might be hidden    Fall/Depression Screening: Fall Risk  11/20/2016 11/20/2016 09/30/2016  Falls in the past year? No No Yes  Number falls in  past yr: - - 2 or more  Injury with Fall? - - No  Risk Factor Category  - - -  Risk for fall due to : Impaired balance/gait;Impaired mobility - Impaired balance/gait;Impaired mobility  Follow up - - Falls prevention discussed;Education provided   [facility] 2/9 Scores 11/20/2016 09/30/2016 09/30/2016 09/22/2016 08/21/2016 06/25/2016 08/14/2015  PHQ - 2 Score 0 2 1 0 0 1 1  PHQ- 9 Score - 6 - - - - -   [facility]N CM Care Plan Problem One     Most Recent Value  Care Plan Problem One  Knowledge deficit in self management of COPD  Role Documenting the Problem One  [place] for Problem One  Active  [facility]N Long Term Goal (31-90 days)  Patient will not have any readmissions for COPD within the next 90 days  [facility]N Long Term Goal Start Date  11/20/16  Interventions for Problem One Long Term Goal  RN sent educational material on COPD exacerbation. RN discussed medicationa adherence. RN discussed follow up Dr Appointment  [facility] CM Short Term Goal #1 (0-30 days)  Patient will be able to verbalize understanding of cold, flu and community acquired pneumonia  THN CM Short Term Goal #1 Start Date  11/20/16  Interventions for Short Term Goal #1  [place] sent EMMI educational material on Cold, Flu and CAP. RN will follow up within with discussion and teachback  [facility]N CM Short Term Goal #2 (0-30 days)  Patient will report drinking nutritional supplement and maintaining weight  [facility]N CM Short Term Goal #2 Start Date  11/20/16  Interventions for Short Term Goal #2  RN discussed nutritional supplement. RN sent coupons to assist with financial purchase    +   Assessment:  Patient will benefit from [employer] telephonic outreach for education and support for COPD self management.  Plan:  RN sent coupons for boost and ensure RN sent educational material on COPD exacerbation RN sent educational material on CAP, Flu and cold RN sent a COPD magnet Referred to Pharmacy RN will follow up within the month of  May  Michele Martin [place] [facility] [phone]

## 2016-11-22 DIAGNOSIS — J449 Chronic obstructive pulmonary disease, unspecified: Secondary | ICD-10-CM | POA: Diagnosis not present

## 2016-11-22 DIAGNOSIS — R0902 Hypoxemia: Secondary | ICD-10-CM | POA: Diagnosis not present

## 2016-12-02 ENCOUNTER — Other Ambulatory Visit: Payer: Self-pay | Admitting: Internal Medicine

## 2016-12-03 ENCOUNTER — Other Ambulatory Visit: Payer: Self-pay | Admitting: Pharmacist

## 2016-12-03 NOTE — Patient Outreach (Signed)
Michele Martin was referred to pharmacy for medication assistance related to the cost of her Spiriva inhaler. Left a HIPAA compliant message on the patient's voicemail. If have not heard from patient by 12/05/16, will give her another call at that time.  Harlow Asa, PharmD, [facility] [facility] [phone]

## 2016-12-05 ENCOUNTER — Ambulatory Visit (INDEPENDENT_AMBULATORY_CARE_PROVIDER_SITE_OTHER): Payer: Medicare Other | Admitting: Family

## 2016-12-05 ENCOUNTER — Ambulatory Visit: Payer: Medicare Other | Admitting: Internal Medicine

## 2016-12-05 ENCOUNTER — Emergency Department (HOSPITAL_COMMUNITY): Payer: Medicare Other

## 2016-12-05 ENCOUNTER — Inpatient Hospital Stay (HOSPITAL_COMMUNITY)
Admission: EM | Admit: 2016-12-05 | Discharge: 2016-12-12 | DRG: 871 | Disposition: A | Discharge status: Home / Self Care | Payer: Medicare Other | Attending: Family Medicine | Admitting: Family Medicine

## 2016-12-05 ENCOUNTER — Encounter (HOSPITAL_COMMUNITY): Payer: Self-pay

## 2016-12-05 ENCOUNTER — Other Ambulatory Visit: Payer: Self-pay | Admitting: Pharmacist

## 2016-12-05 ENCOUNTER — Ambulatory Visit: Payer: Self-pay | Admitting: Pharmacist

## 2016-12-05 ENCOUNTER — Encounter: Payer: Self-pay | Admitting: Family

## 2016-12-05 VITALS — BP 96/58 | HR 116 | Temp 99.5°F | Resp 22 | Ht 61.0 in | Wt 112.0 lb

## 2016-12-05 DIAGNOSIS — Z7951 Long term (current) use of inhaled steroids: Secondary | ICD-10-CM | POA: Diagnosis not present

## 2016-12-05 DIAGNOSIS — F329 Major depressive disorder, single episode, unspecified: Secondary | ICD-10-CM | POA: Diagnosis not present

## 2016-12-05 DIAGNOSIS — R6521 Severe sepsis with septic shock: Secondary | ICD-10-CM | POA: Diagnosis present

## 2016-12-05 DIAGNOSIS — H409 Unspecified glaucoma: Secondary | ICD-10-CM | POA: Diagnosis present

## 2016-12-05 DIAGNOSIS — Z7982 Long term (current) use of aspirin: Secondary | ICD-10-CM

## 2016-12-05 DIAGNOSIS — R0602 Shortness of breath: Secondary | ICD-10-CM | POA: Diagnosis not present

## 2016-12-05 DIAGNOSIS — Z87891 Personal history of nicotine dependence: Secondary | ICD-10-CM | POA: Diagnosis not present

## 2016-12-05 DIAGNOSIS — J449 Chronic obstructive pulmonary disease, unspecified: Secondary | ICD-10-CM

## 2016-12-05 DIAGNOSIS — R4189 Other symptoms and signs involving cognitive functions and awareness: Secondary | ICD-10-CM | POA: Diagnosis present

## 2016-12-05 DIAGNOSIS — R079 Chest pain, unspecified: Secondary | ICD-10-CM | POA: Diagnosis not present

## 2016-12-05 DIAGNOSIS — J9601 Acute respiratory failure with hypoxia: Secondary | ICD-10-CM | POA: Diagnosis not present

## 2016-12-05 DIAGNOSIS — J440 Chronic obstructive pulmonary disease with acute lower respiratory infection: Secondary | ICD-10-CM | POA: Diagnosis not present

## 2016-12-05 DIAGNOSIS — N390 Urinary tract infection, site not specified: Secondary | ICD-10-CM | POA: Diagnosis present

## 2016-12-05 DIAGNOSIS — A419 Sepsis, unspecified organism: Secondary | ICD-10-CM | POA: Diagnosis not present

## 2016-12-05 DIAGNOSIS — B370 Candidal stomatitis: Secondary | ICD-10-CM | POA: Diagnosis not present

## 2016-12-05 DIAGNOSIS — F0390 Unspecified dementia without behavioral disturbance: Secondary | ICD-10-CM | POA: Diagnosis present

## 2016-12-05 DIAGNOSIS — I5033 Acute on chronic diastolic (congestive) heart failure: Secondary | ICD-10-CM | POA: Diagnosis present

## 2016-12-05 DIAGNOSIS — R0789 Other chest pain: Secondary | ICD-10-CM | POA: Diagnosis not present

## 2016-12-05 DIAGNOSIS — J189 Pneumonia, unspecified organism: Secondary | ICD-10-CM | POA: Diagnosis not present

## 2016-12-05 DIAGNOSIS — Z888 Allergy status to other drugs, medicaments and biological substances status: Secondary | ICD-10-CM | POA: Diagnosis not present

## 2016-12-05 DIAGNOSIS — J441 Chronic obstructive pulmonary disease with (acute) exacerbation: Secondary | ICD-10-CM | POA: Diagnosis not present

## 2016-12-05 DIAGNOSIS — Z79899 Other long term (current) drug therapy: Secondary | ICD-10-CM

## 2016-12-05 DIAGNOSIS — E785 Hyperlipidemia, unspecified: Secondary | ICD-10-CM | POA: Diagnosis present

## 2016-12-05 DIAGNOSIS — R0902 Hypoxemia: Secondary | ICD-10-CM

## 2016-12-05 DIAGNOSIS — Z7952 Long term (current) use of systemic steroids: Secondary | ICD-10-CM | POA: Diagnosis not present

## 2016-12-05 DIAGNOSIS — R6510 Systemic inflammatory response syndrome (SIRS) of non-infectious origin without acute organ dysfunction: Secondary | ICD-10-CM

## 2016-12-05 DIAGNOSIS — F419 Anxiety disorder, unspecified: Secondary | ICD-10-CM | POA: Diagnosis present

## 2016-12-05 DIAGNOSIS — Z9981 Dependence on supplemental oxygen: Secondary | ICD-10-CM

## 2016-12-05 DIAGNOSIS — G4733 Obstructive sleep apnea (adult) (pediatric): Secondary | ICD-10-CM | POA: Diagnosis not present

## 2016-12-05 DIAGNOSIS — G3184 Mild cognitive impairment, so stated: Secondary | ICD-10-CM

## 2016-12-05 LAB — CBC WITH DIFFERENTIAL/PLATELET
BASOS ABS: 0 10*3/uL (ref 0.0–0.1)
BASOS PCT: 0 %
Eosinophils Absolute: 0 10*3/uL (ref 0.0–0.7)
Eosinophils Relative: 0 %
HCT: 44.6 % (ref 36.0–46.0)
HEMOGLOBIN: 14.7 g/dL (ref 12.0–15.0)
LYMPHS ABS: 0.9 10*3/uL (ref 0.7–4.0)
Lymphocytes Relative: 10 %
MCH: 32.7 pg (ref 26.0–34.0)
MCHC: 33 g/dL (ref 30.0–36.0)
MCV: 99.3 fL (ref 78.0–100.0)
MONO ABS: 0.6 10*3/uL (ref 0.1–1.0)
MONOS PCT: 6 %
NEUTROS PCT: 84 %
Neutro Abs: 7.6 10*3/uL (ref 1.7–7.7)
Platelets: 171 10*3/uL (ref 150–400)
RBC: 4.49 MIL/uL (ref 3.87–5.11)
RDW: 12.9 % (ref 11.5–15.5)
WBC: 9.1 10*3/uL (ref 4.0–10.5)

## 2016-12-05 LAB — URINALYSIS, ROUTINE W REFLEX MICROSCOPIC
BILIRUBIN URINE: NEGATIVE
GLUCOSE, UA: NEGATIVE mg/dL
HGB URINE DIPSTICK: NEGATIVE
KETONES UR: NEGATIVE mg/dL
NITRITE: NEGATIVE
PH: 5 (ref 5.0–8.0)
Protein, ur: 100 mg/dL — AB
Specific Gravity, Urine: 1.015 (ref 1.005–1.030)

## 2016-12-05 LAB — COMPREHENSIVE METABOLIC PANEL
ALBUMIN: 3.6 g/dL (ref 3.5–5.0)
ALK PHOS: 50 U/L (ref 38–126)
ALT: 26 U/L (ref 14–54)
AST: 34 U/L (ref 15–41)
Anion gap: 12 (ref 5–15)
BILIRUBIN TOTAL: 1.1 mg/dL (ref 0.3–1.2)
BUN: 26 mg/dL — AB (ref 6–20)
CALCIUM: 9 mg/dL (ref 8.9–10.3)
CO2: 30 mmol/L (ref 22–32)
CREATININE: 1 mg/dL (ref 0.44–1.00)
Chloride: 98 mmol/L — ABNORMAL LOW (ref 101–111)
GFR calc Af Amer: >60 mL/min (ref >60)
GFR, EST NON AFRICAN AMERICAN: 55 mL/min — AB (ref >60)
GLUCOSE: 90 mg/dL (ref 65–99)
Potassium: 3.7 mmol/L (ref 3.5–5.1)
Sodium: 140 mmol/L (ref 135–145)
TOTAL PROTEIN: 6.6 g/dL (ref 6.5–8.1)

## 2016-12-05 LAB — I-STAT TROPONIN, ED: Troponin i, poc: 0.01 ng/mL (ref 0.00–0.08)

## 2016-12-05 LAB — I-STAT CG4 LACTIC ACID, ED: LACTIC ACID, VENOUS: 2.74 mmol/L — AB (ref 0.5–1.9)

## 2016-12-05 LAB — LACTIC ACID, PLASMA: LACTIC ACID, VENOUS: 2.8 mmol/L — AB (ref 0.5–1.9)

## 2016-12-05 LAB — LIPASE, BLOOD: Lipase: 13 U/L (ref 11–51)

## 2016-12-05 LAB — PROCALCITONIN: PROCALCITONIN: 8.04 ng/mL

## 2016-12-05 MED ORDER — SODIUM CHLORIDE 0.9 % IV BOLUS (SEPSIS)
1000.0000 mL | Freq: Once | INTRAVENOUS | Status: AC
  Administered 2016-12-05: 1000 mL via INTRAVENOUS

## 2016-12-05 MED ORDER — DEXTROSE 5 % IV SOLN
1.0000 g | Freq: Once | INTRAVENOUS | Status: AC
  Administered 2016-12-05: 1 g via INTRAVENOUS
  Filled 2016-12-05: qty 10

## 2016-12-05 MED ORDER — ACETAMINOPHEN 325 MG PO TABS
650.0000 mg | ORAL_TABLET | Freq: Once | ORAL | Status: AC
  Administered 2016-12-05: 650 mg via ORAL
  Filled 2016-12-05: qty 2

## 2016-12-05 MED ORDER — ATORVASTATIN CALCIUM 20 MG PO TABS
20.0000 mg | ORAL_TABLET | Freq: Every day | ORAL | Status: DC
  Administered 2016-12-05 – 2016-12-11 (×7): 20 mg via ORAL
  Filled 2016-12-05: qty 1
  Filled 2016-12-05: qty 2
  Filled 2016-12-05 (×2): qty 1
  Filled 2016-12-05: qty 2
  Filled 2016-12-05 (×2): qty 1

## 2016-12-05 MED ORDER — METHYLPREDNISOLONE SODIUM SUCC 125 MG IJ SOLR
60.0000 mg | Freq: Four times a day (QID) | INTRAMUSCULAR | Status: DC
  Administered 2016-12-05 – 2016-12-10 (×18): 60 mg via INTRAVENOUS
  Filled 2016-12-05 (×18): qty 2

## 2016-12-05 MED ORDER — SODIUM CHLORIDE 0.9 % IV SOLN
INTRAVENOUS | Status: AC
  Administered 2016-12-05: 22:00:00 via INTRAVENOUS

## 2016-12-05 MED ORDER — MOMETASONE FURO-FORMOTEROL FUM 200-5 MCG/ACT IN AERO
2.0000 | INHALATION_SPRAY | Freq: Two times a day (BID) | RESPIRATORY_TRACT | Status: DC
  Administered 2016-12-06 – 2016-12-10 (×9): 2 via RESPIRATORY_TRACT
  Filled 2016-12-05: qty 8.8

## 2016-12-05 MED ORDER — CEFTRIAXONE SODIUM 1 G IJ SOLR
1.0000 g | INTRAMUSCULAR | Status: DC
  Administered 2016-12-05 – 2016-12-12 (×7): 1 g via INTRAVENOUS
  Filled 2016-12-05 (×7): qty 10

## 2016-12-05 MED ORDER — GABAPENTIN 100 MG PO CAPS
100.0000 mg | ORAL_CAPSULE | Freq: Three times a day (TID) | ORAL | Status: DC
  Administered 2016-12-05 – 2016-12-12 (×21): 100 mg via ORAL
  Filled 2016-12-05 (×21): qty 1

## 2016-12-05 MED ORDER — CLONAZEPAM 0.5 MG PO TABS
0.5000 mg | ORAL_TABLET | Freq: Two times a day (BID) | ORAL | Status: DC | PRN
  Administered 2016-12-07 – 2016-12-10 (×7): 0.5 mg via ORAL
  Filled 2016-12-05 (×7): qty 1

## 2016-12-05 MED ORDER — IPRATROPIUM-ALBUTEROL 0.5-2.5 (3) MG/3ML IN SOLN
3.0000 mL | RESPIRATORY_TRACT | Status: DC
  Administered 2016-12-06 – 2016-12-09 (×20): 3 mL via RESPIRATORY_TRACT
  Filled 2016-12-05 (×22): qty 3

## 2016-12-05 MED ORDER — ASPIRIN EC 81 MG PO TBEC
81.0000 mg | DELAYED_RELEASE_TABLET | Freq: Every day | ORAL | Status: DC
  Administered 2016-12-05 – 2016-12-11 (×7): 81 mg via ORAL
  Filled 2016-12-05 (×7): qty 1

## 2016-12-05 MED ORDER — ENOXAPARIN SODIUM 40 MG/0.4ML ~~LOC~~ SOLN
40.0000 mg | SUBCUTANEOUS | Status: DC
Start: 2016-12-05 — End: 2016-12-12
  Administered 2016-12-05 – 2016-12-11 (×7): 40 mg via SUBCUTANEOUS
  Filled 2016-12-05 (×7): qty 0.4

## 2016-12-05 MED ORDER — ALBUTEROL SULFATE (2.5 MG/3ML) 0.083% IN NEBU
5.0000 mg | INHALATION_SOLUTION | Freq: Once | RESPIRATORY_TRACT | Status: AC
  Administered 2016-12-05: 5 mg via RESPIRATORY_TRACT
  Filled 2016-12-05: qty 6

## 2016-12-05 MED ORDER — DM-GUAIFENESIN ER 30-600 MG PO TB12
2.0000 | ORAL_TABLET | Freq: Two times a day (BID) | ORAL | Status: DC
  Administered 2016-12-05 – 2016-12-12 (×14): 2 via ORAL
  Filled 2016-12-05 (×2): qty 2
  Filled 2016-12-05: qty 1
  Filled 2016-12-05 (×5): qty 2
  Filled 2016-12-05: qty 1
  Filled 2016-12-05 (×6): qty 2

## 2016-12-05 MED ORDER — IPRATROPIUM-ALBUTEROL 0.5-2.5 (3) MG/3ML IN SOLN
3.0000 mL | RESPIRATORY_TRACT | Status: DC | PRN
  Administered 2016-12-10: 3 mL via RESPIRATORY_TRACT
  Filled 2016-12-05: qty 3

## 2016-12-05 MED ORDER — IPRATROPIUM-ALBUTEROL 0.5-2.5 (3) MG/3ML IN SOLN
3.0000 mL | Freq: Once | RESPIRATORY_TRACT | Status: AC
  Administered 2016-12-05: 3 mL via RESPIRATORY_TRACT
  Filled 2016-12-05: qty 3

## 2016-12-05 MED ORDER — SODIUM CHLORIDE 0.9 % IV BOLUS (SEPSIS)
500.0000 mL | Freq: Once | INTRAVENOUS | Status: AC
  Administered 2016-12-05: 500 mL via INTRAVENOUS

## 2016-12-05 MED ORDER — IPRATROPIUM-ALBUTEROL 0.5-2.5 (3) MG/3ML IN SOLN
3.0000 mL | RESPIRATORY_TRACT | Status: DC
  Administered 2016-12-05: 3 mL via RESPIRATORY_TRACT
  Filled 2016-12-05: qty 3

## 2016-12-05 MED ORDER — ESCITALOPRAM OXALATE 10 MG PO TABS
10.0000 mg | ORAL_TABLET | Freq: Every day | ORAL | Status: DC
  Administered 2016-12-05 – 2016-12-12 (×8): 10 mg via ORAL
  Filled 2016-12-05 (×8): qty 1

## 2016-12-05 MED ORDER — DONEPEZIL HCL 10 MG PO TABS
10.0000 mg | ORAL_TABLET | Freq: Every day | ORAL | Status: DC
  Administered 2016-12-05 – 2016-12-11 (×7): 10 mg via ORAL
  Filled 2016-12-05 (×4): qty 1
  Filled 2016-12-05: qty 2
  Filled 2016-12-05 (×2): qty 1

## 2016-12-05 MED ORDER — METHYLPREDNISOLONE SODIUM SUCC 125 MG IJ SOLR
125.0000 mg | Freq: Once | INTRAMUSCULAR | Status: AC
  Administered 2016-12-05: 125 mg via INTRAVENOUS
  Filled 2016-12-05: qty 2

## 2016-12-05 MED ORDER — AZITHROMYCIN 250 MG PO TABS
500.0000 mg | ORAL_TABLET | Freq: Every day | ORAL | Status: AC
  Administered 2016-12-06 – 2016-12-09 (×4): 500 mg via ORAL
  Filled 2016-12-05 (×4): qty 2

## 2016-12-05 MED ORDER — IPRATROPIUM-ALBUTEROL 0.5-2.5 (3) MG/3ML IN SOLN
3.0000 mL | Freq: Once | RESPIRATORY_TRACT | Status: AC
  Administered 2016-12-05: 3 mL via RESPIRATORY_TRACT

## 2016-12-05 MED ORDER — IPRATROPIUM-ALBUTEROL 0.5-2.5 (3) MG/3ML IN SOLN: 3.0000 mL | RESPIRATORY_TRACT | Status: DC

## 2016-12-05 MED ORDER — MONTELUKAST SODIUM 10 MG PO TABS
10.0000 mg | ORAL_TABLET | Freq: Every day | ORAL | Status: DC
  Administered 2016-12-05 – 2016-12-11 (×7): 10 mg via ORAL
  Filled 2016-12-05 (×7): qty 1

## 2016-12-05 MED ORDER — DEXTROSE 5 % IV SOLN
500.0000 mg | Freq: Once | INTRAVENOUS | Status: AC
  Administered 2016-12-05: 500 mg via INTRAVENOUS
  Filled 2016-12-05: qty 500

## 2016-12-05 NOTE — Assessment & Plan Note (Signed)
COPD exacerbation with concern for pneumonia or sepsis given vital signs and physical exam. Discussed need for emergent treatment given her condition. She was agreeable to go to the hospital. [place]one[place] provided in the office prior to EMS arrival. Patient remained stable throughout her office visit and upon leaving with EMS.

## 2016-12-05 NOTE — Progress Notes (Signed)
CRITICAL VALUE ALERT  Critical value received:  Lactic acid 2.8  Date of notification:  12/05/16   Time of notification:  2225  Critical value read back:Yes.    Nurse who received alert:  Leona Carry  MD notified (1st page):  Lily Kocher  Time of first page:  2225  Responding MD:  Lily Kocher   Time MD responded:  2230

## 2016-12-05 NOTE — Patient Outreach (Signed)
Michele Martin was referred to pharmacy for medication assistance related to the cost of her Spiriva inhaler. Woman answering the phone reports that the patient is not feeling well today and asks that I call back another time. Let her know that I will try the patient back next week. Offer to leave my number, but she declines to take it.  Will call back next week.  Harlow Asa, PharmD, [place] [facility] [phone]

## 2016-12-05 NOTE — ED Provider Notes (Signed)
[facility] DEPT Provider Note   CSN: [id number] Arrival date & time: 12/05/16  1614     History   Chief Complaint Chief Complaint  Patient presents with  . Shortness of Breath    HPI Michele Martin is a 71 y.o. female.  The history is provided by the patient and medical records.  Shortness of Breath  This is a recurrent problem. The average episode lasts 2 days. The problem occurs continuously.The current episode started 2 days ago. The problem has been gradually worsening. Associated symptoms include a fever, cough, sputum production and wheezing. Pertinent negatives include no headaches, no neck pain, no chest pain, no syncope, no vomiting, no abdominal pain, no rash and no leg swelling. She has tried nothing for the symptoms. The treatment provided no relief. She has had prior hospitalizations. Associated medical issues include asthma and COPD. Associated medical issues do not include DVT.    Past Medical History:  Diagnosis Date  . Acute angle-closure glaucoma 02/14/2010  . ALLERGIC RHINITIS 09/19/2009  . ANXIETY 04/15/2010  . Arthritis   . ASTHMA 09/19/2009  . BACK PAIN 02/14/2010  . Chronic bronchitis 04/14/2011  . CHRONIC OBSTRUCTIVE PULMONARY DISEASE, ACUTE EXACERBATION 12/19/2009  . Complication of anesthesia   . COPD 06/27/2009  . DEPRESSION 09/19/2009  . HOARSENESS 10/17/2009  . HYPERLIPIDEMIA 09/19/2009  . MENOPAUSAL DISORDER 08/20/2010  . OSTEOPENIA 09/10/2010  . Osteoporosis, unspecified 04/20/2014  . Rotator cuff tear 06/23/2011  . TINNITUS 09/10/2010  . TREMOR 02/14/2010  . Tremor 07/30/2011  . URI 09/10/2010  . Wheezing 06/27/2009    Patient Active Problem List   Diagnosis Date Noted  . Abnormal TSH 10/28/2016  . B12 deficiency 09/18/2016  . Abnormal finding on screening procedure 09/18/2016  . Risk for falls 08/27/2016  . Protein-calorie malnutrition, severe 08/06/2016  . COPD, group D, by GOLD 2017 classification ([place]) 08/05/2016  . Acute respiratory  failure with hypoxia and hypercapnia ([place]) 07/26/2016  . Grief reaction 05/15/2016  . Rotator cuff tear arthropathy of both shoulders 04/17/2016  . Neck pain on right side 03/13/2016  . Angioedema 10/23/2015  . Cough 09/07/2015  . Benign neoplasm of meninges ([place]) 08/29/2015  . Dizziness and giddiness 08/29/2015  . Mild cognitive impairment 08/10/2015  . Benign paroxysmal positional vertigo 08/10/2015  . Hyperlipidemia 08/10/2015  . Memory dysfunction 07/03/2015  . Solitary pulmonary nodule 05/09/2015  . Right hip pain 01/23/2015  . Chest pain 10/25/2014  . Right sided sciatica 10/11/2014  . Chronic pain syndrome 10/11/2014  . Osteoporosis 04/20/2014  . COPD exacerbation ([place]) 08/25/2013  . General weakness 12/22/2012  . Personal history of colonic polyps 10/25/2012  . Anterior neck pain 10/25/2012  . COPD GOLD III/ still smoking  08/24/2012  . Shingles 07/14/2012  . Orthostatic hypotension 05/07/2012  . Involuntary movements 11/19/2011  . Tremor 07/30/2011  . Rotator cuff tear 06/23/2011  . Lower back pain 06/23/2011  . Balance problem 06/23/2011  . Tobacco abuse 05/22/2011  . Preop exam for internal medicine 05/13/2011  . Chronic bronchitis ([place]) 04/14/2011  . Peripheral edema 01/13/2011  . Right shoulder pain 01/13/2011  . Fatigue 12/03/2010  . Encounter for well adult exam with abnormal findings 10/31/2010  . TINNITUS 09/10/2010  . MENOPAUSAL DISORDER 08/20/2010  . Anxiety and depression 04/15/2010  . Acute angle-closure glaucoma 02/14/2010  . HOARSENESS 10/17/2009  . Allergic rhinitis 09/19/2009    Past Surgical History:  Procedure Laterality Date  . ABDOMINAL HYSTERECTOMY    . cervical fusion 2013 - Dr  Vertell Limber    . Normal Heart Cath  2001   Dr. Johnsie Cancel  . s/p laser tx for glaucoma     no vision loss    OB History    No data available       Home Medications    Prior to Admission medications   Medication Sig Start Date End Date Taking? Authorizing  Provider  albuterol (PROVENTIL HFA) 108 (90 Base) MCG/ACT inhaler Inhale 2 puffs into the lungs every 6 (six) hours as needed for wheezing or shortness of breath. 11/13/16   Biagio Borg, MD  albuterol (PROVENTIL HFA;VENTOLIN HFA) 108 (90 BASE) MCG/ACT inhaler Inhale 2 puffs into the lungs every 6 (six) hours as needed for wheezing. 01/12/12 11/13/17  Biagio Borg, MD  aspirin 81 MG EC tablet Take 81 mg by mouth daily.      [provider]  atorvastatin (LIPITOR) 20 MG tablet TAKE 1 TABLET BY MOUTH ONCE DAILY Patient taking differently: TAKE '20mg'$  TABLET BY MOUTH ONCE in the evening 12/31/15   Biagio Borg, MD  B Complex Vitamins (B-COMPLEX/B-12 PO) Take by mouth daily.    [provider]  budesonide-formoterol (SYMBICORT) 160-4.5 MCG/ACT inhaler Inhale 2 puffs into the lungs 2 (two) times daily. 02/12/16   Biagio Borg, MD  clonazePAM (KLONOPIN) 0.5 MG tablet Take 1 tablet (0.5 mg total) by mouth 2 (two) times daily as needed for anxiety. 08/20/16   Biagio Borg, MD  dextromethorphan-guaiFENesin [facility] DM) 30-600 MG 12hr tablet Take 2 tablets by mouth 2 (two) times daily. 07/28/16   Barton Dubois, MD  diphenhydrAMINE (BENADRYL) 25 mg capsule Take 25 mg by mouth every 6 (six) hours as needed for allergies.    [provider]  donepezil (ARICEPT) 10 MG tablet Take 1 tablet daily Patient taking differently: Take 10 mg by mouth at bedtime.  04/16/16   Cameron Sprang, MD  escitalopram (LEXAPRO) 20 MG tablet Take 0.5 tablets (10 mg total) by mouth daily. 08/20/16   Biagio Borg, MD  hydrochlorothiazide (HYDRODIURIL) 25 MG tablet Take 1 tablet (25 mg total) by mouth daily. 10/28/16   Biagio Borg, MD  montelukast (SINGULAIR) 10 MG tablet TAKE 1 TABLET BY MOUTH EVERY NIGHT AT BEDTIME Patient taking differently: TAKE '10mg'$  TABLET BY MOUTH EVERY NIGHT AT BEDTIME 03/26/16   Biagio Borg, MD  predniSONE (DELTASONE) 10 MG tablet 3 tabs by mouth per day for 3 days,2tabs per day for 3  days,1tab per day for 3 days 11/13/16   Biagio Borg, MD  SYMBICORT 160-4.5 MCG/ACT inhaler INHALE 2 PUFFS BY MOUTH TWICE DAILY 12/02/16   Biagio Borg, MD  tiotropium (SPIRIVA HANDIHALER) 18 MCG inhalation capsule Place 1 capsule (18 mcg total) into inhaler and inhale daily. 10/28/16   Biagio Borg, MD    Family History Family History  Problem Relation Age of Onset  . Dementia Mother   . Rheum arthritis Maternal Uncle   . Colon cancer Neg Hx     Social History Social History  Substance Use Topics  . Smoking status: Former Smoker    Packs/day: 1.00    Years: 40.00    Types: Cigarettes    Start date: 07/03/2015  . Smokeless tobacco: Never Used  . Alcohol use No     Allergies   Pravastatin; Simvastatin; and Statins   Review of Systems Review of Systems  Constitutional: Positive for chills, fatigue and fever. Negative for diaphoresis.  HENT: Positive for congestion.  Respiratory: Positive for cough, sputum production, shortness of breath and wheezing. Negative for chest tightness.   Cardiovascular: Negative for chest pain, palpitations, leg swelling and syncope.  Gastrointestinal: Negative for abdominal pain, constipation, diarrhea, nausea and vomiting.  Genitourinary: Negative for dysuria, flank pain and frequency.  Musculoskeletal: Negative for back pain, neck pain and neck stiffness.  Skin: Negative for rash and wound.  Neurological: Negative for headaches.  All other systems reviewed and are negative.    Physical Exam Updated Vital Signs BP 111/61   Pulse 96   Temp 99.4 F (37.4 C) (Oral)   Resp (!) 23   SpO2 91%   Physical Exam  Constitutional: She is oriented to person, place, and time. She appears well-developed and well-nourished. No distress.  HENT:  Head: Normocephalic and atraumatic.  Eyes: Conjunctivae and EOM are normal. Pupils are equal, round, and reactive to light.  Neck: Neck supple.  Cardiovascular: Tachycardia present.   No murmur  heard. Pulmonary/Chest: Effort normal. No stridor. Tachypnea noted. No respiratory distress. She has wheezes. She has rhonchi. She has rales. She exhibits no tenderness.  Abdominal: Soft. There is no tenderness.  Musculoskeletal: She exhibits no edema or tenderness.  Neurological: She is alert and oriented to person, place, and time. No cranial nerve deficit or sensory deficit.  Skin: Skin is warm and dry. No rash noted. She is not diaphoretic. No erythema.  Psychiatric: She has a normal mood and affect.  Nursing note and vitals reviewed.    ED Treatments / Results  Labs (all labs ordered are listed, but only abnormal results are displayed) Labs Reviewed  COMPREHENSIVE METABOLIC PANEL - Abnormal; Notable for the following:       Result Value   Chloride 98 (*)    BUN 26 (*)    GFR calc non Af Amer 55 (*)    All other components within normal limits  URINALYSIS, ROUTINE W REFLEX MICROSCOPIC - Abnormal; Notable for the following:    Color, Urine AMBER (*)    APPearance HAZY (*)    Protein, ur 100 (*)    Leukocytes, UA LARGE (*)    Bacteria, UA MANY (*)    Squamous Epithelial / LPF 0-5 (*)    Non Squamous Epithelial 0-5 (*)    All other components within normal limits  LACTIC ACID, PLASMA - Abnormal; Notable for the following:    Lactic Acid, Venous 2.8 (*)    All other components within normal limits  CBC - Abnormal; Notable for the following:    RBC 3.74 (*)    Hemoglobin 11.9 (*)    Platelets 135 (*)    All other components within normal limits  BASIC METABOLIC PANEL - Abnormal; Notable for the following:    Potassium 3.3 (*)    Glucose, Bld 113 (*)    BUN 21 (*)    Calcium 7.2 (*)    All other components within normal limits  LACTIC ACID, PLASMA - Abnormal; Notable for the following:    Lactic Acid, Venous 4.1 (*)    All other components within normal limits  MAGNESIUM - Abnormal; Notable for the following:    Magnesium 1.3 (*)    All other components within normal  limits  I-STAT CG4 LACTIC ACID, ED - Abnormal; Notable for the following:    Lactic Acid, Venous 2.74 (*)    All other components within normal limits  CULTURE, BLOOD (ROUTINE X 2)  CULTURE, BLOOD (ROUTINE X 2)  MRSA PCR SCREENING  CULTURE,  EXPECTORATED SPUTUM-ASSESSMENT  GRAM STAIN  URINE CULTURE  CBC WITH DIFFERENTIAL/PLATELET  LIPASE, BLOOD  STREP PNEUMONIAE URINARY ANTIGEN  PROCALCITONIN  PHOSPHORUS  LEGIONELLA PNEUMOPHILA SEROGP 1 UR AG  I-STAT TROPOININ, ED    EKG  EKG Interpretation  Date/Time:  Friday Dec 05 2016 16:30:34 EDT Ventricular Rate:  96 PR Interval:    QRS Duration: 91 QT Interval:  339 QTC Calculation: 429 R Axis:   63 Text Interpretation:  Sinus rhythm Borderline short PR interval Probable left atrial enlargement Nonspecific repolarization abnormalities Significant artifact When compared to prior, no significant chcanges seen. No STEMI Confirmed by Sherry Ruffing MD, [place] [phone]) on 12/05/2016 5:22:44 PM       Radiology Dg Chest 2 View  Result Date: 12/05/2016 CLINICAL DATA:  Shortness of breath question pneumonia, history asthma, COPD, chronic bronchitis, former smoker EXAM: CHEST  2 VIEW COMPARISON:  08/04/2016 FINDINGS: Normal heart size, mediastinal contours, and pulmonary vascularity. Atherosclerotic calcification aorta. Extensive infiltrate throughout LEFT upper lobe and probably within RIGHT lower lobe as well consistent with pneumonia. No pleural effusion or pneumothorax. Underlying emphysematous changes present. Bones demineralized with evidence of prior cervical spine surgery. IMPRESSION: COPD changes with LEFT upper lobe and RIGHT lower lobe infiltrates consistent with pneumonia. Aortic atherosclerosis. Electronically Signed   By: Lavonia Dana M.D.   On: 12/05/2016 17:25    Procedures Procedures (including critical care time)  CRITICAL CARE Performed by: Gwenyth Allegra Amayia Ciano Total critical care time: 35 minutes Critical care time was  exclusive of separately billable procedures and treating other patients. Critical care was necessary to treat or prevent imminent or life-threatening deterioration. Critical care was time spent personally by me on the following activities: development of treatment plan with patient and/or surrogate as well as nursing, discussions with consultants, evaluation of patient's response to treatment, examination of patient, obtaining history from patient or surrogate, ordering and performing treatments and interventions, ordering and review of laboratory studies, ordering and review of radiographic studies, pulse oximetry and re-evaluation of patient's condition.   Medications Ordered in ED Medications  cefTRIAXone (ROCEPHIN) 1 g in dextrose 5 % 50 mL IVPB (0 g Intravenous Stopped 12/05/16 2338)  azithromycin (ZITHROMAX) tablet 500 mg (500 mg Oral Given 12/06/16 1035)  aspirin EC tablet 81 mg (81 mg Oral Given 12/05/16 2313)  gabapentin (NEURONTIN) capsule 100 mg (100 mg Oral Given 12/06/16 1035)  clonazePAM (KLONOPIN) tablet 0.5 mg (not administered)  escitalopram (LEXAPRO) tablet 10 mg (10 mg Oral Given 12/06/16 1035)  dextromethorphan-guaiFENesin (MUCINEX DM) 30-600 MG per 12 hr tablet 2 tablet (2 tablets Oral Given 12/06/16 1036)  donepezil (ARICEPT) tablet 10 mg (10 mg Oral Given 12/05/16 2312)  montelukast (SINGULAIR) tablet 10 mg (10 mg Oral Given 12/05/16 2313)  mometasone-formoterol (DULERA) 200-5 MCG/ACT inhaler 2 puff (2 puffs Inhalation Given 12/06/16 0901)  atorvastatin (LIPITOR) tablet 20 mg (20 mg Oral Given 12/05/16 2313)  enoxaparin (LOVENOX) injection 40 mg (40 mg Subcutaneous Given 12/05/16 2307)  0.9 %  sodium chloride infusion ( Intravenous New Bag/Given 12/05/16 2219)  ipratropium-albuterol (DUONEB) 0.5-2.5 (3) MG/3ML nebulizer solution 3 mL (not administered)  methylPREDNISolone sodium succinate (SOLU-MEDROL) 125 mg/2 mL injection 60 mg (60 mg Intravenous Given 12/06/16 1035)   ipratropium-albuterol (DUONEB) 0.5-2.5 (3) MG/3ML nebulizer solution 3 mL (3 mLs Nebulization Given 12/06/16 1122)  0.9 % NaCl with KCl 40 mEq / L  infusion (125 mL/hr Intravenous New Bag/Given 12/06/16 0653)  albuterol (PROVENTIL) (2.5 MG/3ML) 0.083% nebulizer solution 5 mg (5 mg Nebulization Given 12/05/16 1649)  ipratropium-albuterol (DUONEB) 0.5-2.5 (3) MG/3ML nebulizer solution 3 mL (3 mLs Nebulization Given 12/05/16 1735)  methylPREDNISolone sodium succinate (SOLU-MEDROL) 125 mg/2 mL injection 125 mg (125 mg Intravenous Given 12/05/16 1735)  acetaminophen (TYLENOL) tablet 650 mg (650 mg Oral Given 12/05/16 1719)  cefTRIAXone (ROCEPHIN) 1 g in dextrose 5 % 50 mL IVPB (0 g Intravenous Stopped 12/05/16 1844)  azithromycin (ZITHROMAX) 500 mg in dextrose 5 % 250 mL IVPB (0 mg Intravenous Stopped 12/05/16 2000)  sodium chloride 0.9 % bolus 1,000 mL (0 mLs Intravenous Stopped 12/05/16 2046)  sodium chloride 0.9 % bolus 500 mL (500 mLs Intravenous New Bag/Given 12/05/16 1954)  sodium chloride 0.9 % bolus 1,000 mL (1,000 mLs Intravenous New Bag/Given 12/05/16 2238)  sodium chloride 0.9 % bolus 1,000 mL (0 mLs Intravenous Stopped 12/06/16 0531)  magnesium sulfate IVPB 2 g 50 mL (0 g Intravenous Stopped 12/06/16 0753)  potassium chloride SA (K-DUR,KLOR-CON) CR tablet 20 mEq (20 mEq Oral Given 12/06/16 1035)  sodium chloride 0.9 % bolus 500 mL (500 mLs Intravenous New Bag/Given 12/06/16 1036)     Initial Impression / Assessment and Plan / ED Course  I have reviewed the triage vital signs and the nursing notes.  Pertinent labs & imaging results that were available during my care of the patient were reviewed by me and considered in my medical decision making (see chart for details).     JAYMA VOLPI is a 71 y.o. female with a past medical history significant for asthma and COPD who presents with shortness of breath, productive cough, nausea, vomiting, and chills. Patients come in by family reports that  the patient has had worsening breathing over the last 2 days. She says that she has been coughing up a mucus-like sputum. She was reports some chest tightness that is not a pressure. She says the tightness is intermittent in severity. Patient says that she received albuterol breathing treatment at her doctor's office while going to the ED. She denies she has been feeling more fatigued and has had decreased appetite over the last few days. Patient says that she had nausea and vomiting yesterday and today.  Arrival, patient had a temp of 99.5. On my exam, patient felt warm, rectal temperature. Temperature found to be 103.2 rectal. Patient also found to have tachycardia and tachypnea. Patient was hypoxic on my examination and she was started on nasal cannula 2 L. This improved her oxygenation.  Patient's lungs were very coarse and wheezing. Patient given breathing treatment and steroids. Suspect COPD exacerbation, given her vitals and the cough, suspect she may have component of pneumonia.  X-ray was ordered and shows pneumonia.  Patient will be treated for previous history of pneumonia as well as COPD exacerbation. Patient had fluids ordered. Cultures were obtained.  She'll be admitted to the hospitalist service for further management of pneumonia/sepsis and COPD exacerbation.    Final Clinical Impressions(s) / ED Diagnoses   Final diagnoses:  Community acquired pneumonia, unspecified laterality  COPD exacerbation ([place])  Sepsis due to pneumonia ([place])    Clinical Impression: 1. Community acquired pneumonia, unspecified laterality   2. COPD exacerbation ([place])   3. Sepsis due to pneumonia [facility])     Disposition: Admit to Hospitalist service    Lilja Soland, Gwenyth Allegra, MD 12/06/16 1228

## 2016-12-05 NOTE — ED Notes (Signed)
Blood Cultures drawn before antibiotic administration. See 1734

## 2016-12-05 NOTE — ED Notes (Signed)
Pt noted to be more talkative and energetic than when she arrived.

## 2016-12-05 NOTE — Progress Notes (Signed)
Pharmacy Antibiotic Note  Michele Martin is a 71 y.o. female admitted on 12/05/2016 with CAP.  Pharmacy has been consulted for Rocephin and zithromax dosing.  Plan:  Rocephin 1 gm IV q24 Azithromycin 500 mg IV x 1 ordered in ED then azithromycin 500 mg po qday x 4 days Pharmacy to sign off  Height: '5\' 1"'$  (154.9 cm) Weight: 113 lb (51.3 kg) IBW/kg (Calculated) : 47.8  Temp (24hrs), Avg:100.7 F (38.2 C), Min:99.4 F (37.4 C), Max:103.2 F (39.6 C)   Recent Labs Lab 12/05/16 1727 12/05/16 1735  WBC 9.1  --   LATICACIDVEN  --  2.74*    CrCl cannot be calculated (Patient's most recent lab result is older than the maximum 21 days allowed.).    Allergies  Allergen Reactions  . Pravastatin Itching and Other (See Comments)    Myalgias/cramping  . Simvastatin Itching and Other (See Comments)    Myalgias/cramping  . Statins Itching and Other (See Comments)    Myalgias/cramping    Eudelia Bunch, Pharm.D. [phone] 12/05/2016 5:50 PM

## 2016-12-05 NOTE — H&P (Addendum)
History and Physical    Michele Martin PPI:[id number] DOB: [birth year] DOA: 12/05/2016  PCP: Biagio Borg, MD   Patient coming from: [facility] clinic via EMS  Chief Complaint: Shortness of breath, nausea, vomiting, fever  HPI: Michele Martin is a 71 y.o. woman with a history of COPD, OSA (she is not on CPAP at baseline but she is on 2L [place] qHS at home), asthma, osteoporosis, HLD, glaucoma, and anxiety/depression who presents to the ED via EMS from her PCP's office for evaluation of shortness of breath, nausea/vomiting, and fever.  The patient feels that she was in her baseline state of health until last night.  She developed nausea and vomiting and has not been able to tolerate any significant PO intake today.  She has also had subjective fever with sweats, but she denies chills.  She has had a productive cough, increased shortness of breath, and increased wheezing.  No known sick contacts.  No lower urinary tract symptoms.  No significant abdominal pain.    She received one duoneb in her PCP's office prior to transfer to the ED.  ED Course: The patient had a rectal temp of 103 with mild tachycardia and relative hypotension (systolic BP 06'T).  O2 sats 90% on RA, 95% with 2L [place].  Normal WBC count.  BUN 26 otherwise unremarkable CMP.  Troponin 0.01.  Lactic acid level 2.74.  Chest xray shows LUL and RLL infiltrates.  U/A shows large leukocytes, TNTC WBC, and many bacteria.  She has received 1L NS bolus, Solumedrol '125mg'$  IV x one, one continuous albuterol neb and one duoneb.  Blood cultures have been drawn.  She has received Rocephin and azithromycin.  Hospitalist asked to admit.  Review of Systems: As per HPI otherwise 10 systems reviewed and negative.   Past Medical History:  Diagnosis Date  . Acute angle-closure glaucoma 02/14/2010  . ALLERGIC RHINITIS 09/19/2009  . ANXIETY 04/15/2010  . Arthritis   . ASTHMA 09/19/2009  . BACK PAIN 02/14/2010  . Chronic bronchitis  04/14/2011  . CHRONIC OBSTRUCTIVE PULMONARY DISEASE, ACUTE EXACERBATION 12/19/2009  . Complication of anesthesia   . COPD 06/27/2009  . DEPRESSION 09/19/2009  . HOARSENESS 10/17/2009  . HYPERLIPIDEMIA 09/19/2009  . MENOPAUSAL DISORDER 08/20/2010  . OSTEOPENIA 09/10/2010  . Osteoporosis, unspecified 04/20/2014  . Rotator cuff tear 06/23/2011  . TINNITUS 09/10/2010  . TREMOR 02/14/2010  . Tremor 07/30/2011  . URI 09/10/2010  . Wheezing 06/27/2009    Past Surgical History:  Procedure Laterality Date  . ABDOMINAL HYSTERECTOMY    . cervical fusion 2013 - Dr Vertell Limber    . Normal Heart Cath  2001   Dr. Johnsie Cancel  . s/p laser tx for glaucoma     no vision loss     reports that she has quit smoking. Her smoking use included Cigarettes. She started smoking about 17 months ago. She has a 40.00 pack-year smoking history. She has never used smokeless tobacco. She reports that she does not drink alcohol or use drugs.  She is a widow.  She is originally from [place], but she has been in [place] for 40 years.  She has a son who she identifies as her next of kin/emergency contact.  Allergies  Allergen Reactions  . Pravastatin Itching and Other (See Comments)    Reaction:  Muscle cramps   . Simvastatin Itching and Other (See Comments)    Reaction:  Muscle cramps   . Statins Itching and Other (See Comments)  Reaction:  Muscle cramps    Family History  Problem Relation Age of Onset  . Dementia Mother   . Rheum arthritis Maternal Uncle   . Colon cancer Neg Hx      Prior to Admission medications   Medication Sig Start Date End Date Taking? Authorizing Provider  albuterol (PROVENTIL HFA) 108 (90 Base) MCG/ACT inhaler Inhale 2 puffs into the lungs every 6 (six) hours as needed for wheezing or shortness of breath. 11/13/16  Yes Biagio Borg, MD  aspirin EC 81 MG tablet Take 81 mg by mouth at bedtime.   Yes [provider]  atorvastatin (LIPITOR) 20 MG tablet TAKE 1 TABLET BY MOUTH ONCE  DAILY Patient taking differently: Take 1 tablet by mouth at bedtime 12/31/15  Yes Biagio Borg, MD  b complex vitamins tablet Take 1 tablet by mouth at bedtime.   Yes [provider]  budesonide-formoterol (SYMBICORT) 160-4.5 MCG/ACT inhaler Inhale 2 puffs into the lungs 2 (two) times daily. 02/12/16  Yes Biagio Borg, MD  clonazePAM (KLONOPIN) 0.5 MG tablet Take 1 tablet (0.5 mg total) by mouth 2 (two) times daily as needed for anxiety. 08/20/16  Yes Biagio Borg, MD  dextromethorphan-guaiFENesin [facility] DM) 30-600 MG 12hr tablet Take 2 tablets by mouth 2 (two) times daily. 07/28/16  Yes Barton Dubois, MD  diphenhydrAMINE (BENADRYL) 25 mg capsule Take 25 mg by mouth every 6 (six) hours as needed for itching or allergies.    Yes [provider]  donepezil (ARICEPT) 10 MG tablet Take 1 tablet daily Patient taking differently: Take 10 mg by mouth at bedtime.  04/16/16  Yes Cameron Sprang, MD  escitalopram (LEXAPRO) 20 MG tablet Take 0.5 tablets (10 mg total) by mouth daily. 08/20/16  Yes Biagio Borg, MD  gabapentin (NEURONTIN) 100 MG capsule Take 100 mg by mouth 3 (three) times daily.   Yes [provider]  hydrochlorothiazide (HYDRODIURIL) 25 MG tablet Take 1 tablet (25 mg total) by mouth daily. 10/28/16  Yes Biagio Borg, MD  montelukast (SINGULAIR) 10 MG tablet TAKE 1 TABLET BY MOUTH EVERY NIGHT AT BEDTIME 03/26/16  Yes Biagio Borg, MD  tiotropium (SPIRIVA HANDIHALER) 18 MCG inhalation capsule Place 1 capsule (18 mcg total) into inhaler and inhale daily. 10/28/16  Yes Biagio Borg, MD    Physical Exam: Vitals:   12/05/16 1632 12/05/16 1711 12/05/16 1738 12/05/16 1851  BP:    (!) 91/56  Pulse:    (!) 104  Resp:    18  Temp:  (!) 103.2 F (39.6 C)  99.8 F (37.7 C)  TempSrc:  Rectal  Oral  SpO2: 91%   92%  Weight:   51.3 kg (113 lb)   Height:   '5\' 1"'$  (1.549 m)       Constitutional: NAD, calm, ill appearing but she is not acutely decompensating Vitals:    12/05/16 1632 12/05/16 1711 12/05/16 1738 12/05/16 1851  BP:    (!) 91/56  Pulse:    (!) 104  Resp:    18  Temp:  (!) 103.2 F (39.6 C)  99.8 F (37.7 C)  TempSrc:  Rectal  Oral  SpO2: 91%   92%  Weight:   51.3 kg (113 lb)   Height:   '5\' 1"'$  (1.549 m)    Eyes: PERRL, lids and conjunctivae normal ENMT: Mucous membranes are dry. Posterior pharynx not completely visualized. Normal dentition.  Neck: normal appearance, supple, no masses Respiratory: Coarse bilaterally with  diffuse expiratory wheezing.  Normal respiratory effort. No accessory muscle use.  Cardiovascular: Normal rate, regular rhythm, no murmurs / rubs / gallops. No extremity edema. 2+ pedal pulses. GI: abdomen is soft and compressible.  No distention.  No tenderness.  No masses palpated.  Bowel sounds are present. Musculoskeletal:  No joint deformity in upper and lower extremities. Good ROM, no contractures. Normal muscle tone.  Skin: no rashes, clammy, multiple bruises present Neurologic: CN 2-12 grossly intact. Sensation intact, Strength symmetric bilaterally. Psychiatric: Normal judgment and insight. Alert and oriented x 3. Normal mood.     Labs on Admission: I have personally reviewed following labs and imaging studies  CBC:  Recent Labs Lab 12/05/16 1727  WBC 9.1  NEUTROABS 7.6  HGB 14.7  HCT 44.6  MCV 99.3  PLT 710   Basic Metabolic Panel:  Recent Labs Lab 12/05/16 1727  NA 140  K 3.7  CL 98*  CO2 30  GLUCOSE 90  BUN 26*  CREATININE 1.00  CALCIUM 9.0   GFR: Estimated Creatinine Clearance: 38.4 mL/min (by C-G formula based on SCr of 1 mg/dL). Liver Function Tests:  Recent Labs Lab 12/05/16 1727  AST 34  ALT 26  ALKPHOS 50  BILITOT 1.1  PROT 6.6  ALBUMIN 3.6    Recent Labs Lab 12/05/16 1727  LIPASE 13   Urine analysis:    Component Value Date/Time   COLORURINE AMBER (A) 12/05/2016 1727   APPEARANCEUR HAZY (A) 12/05/2016 1727   LABSPEC 1.015 12/05/2016 1727   PHURINE 5.0  12/05/2016 1727   GLUCOSEU NEGATIVE 12/05/2016 [place] 08/09/2015 1504   HGBUR NEGATIVE 12/05/2016 1727   [place] 12/05/2016 1727   KETONESUR NEGATIVE 12/05/2016 1727   PROTEINUR 100 (A) 12/05/2016 1727   UROBILINOGEN 0.2 08/09/2015 1504   NITRITE NEGATIVE 12/05/2016 1727   LEUKOCYTESUR LARGE (A) 12/05/2016 1727   Sepsis Labs:  Lactic acid level 2.74  Radiological Exams on Admission: Dg Chest 2 View  Result Date: 12/05/2016 CLINICAL DATA:  Shortness of breath question pneumonia, history asthma, COPD, chronic bronchitis, former smoker EXAM: CHEST  2 VIEW COMPARISON:  08/04/2016 FINDINGS: Normal heart size, mediastinal contours, and pulmonary vascularity. Atherosclerotic calcification aorta. Extensive infiltrate throughout LEFT upper lobe and probably within RIGHT lower lobe as well consistent with pneumonia. No pleural effusion or pneumothorax. Underlying emphysematous changes present. Bones demineralized with evidence of prior cervical spine surgery. IMPRESSION: COPD changes with LEFT upper lobe and RIGHT lower lobe infiltrates consistent with pneumonia. Aortic atherosclerosis. Electronically Signed   By: Lavonia Dana M.D.   On: 12/05/2016 17:25    EKG: Independently reviewed. NSR.  No acute ST elevation.  Assessment/Plan Principal Problem:   CAP (community acquired pneumonia) Active Problems:   Anxiety and depression   Severe sepsis with septic shock (HCC)   Acute lower UTI   Acute respiratory failure with hypoxia (HCC)   COPD with acute exacerbation (HCC)     Severe sepsis secondary to CAP --Continue IV Rocephin and oral azithromcyin --Blood cultures pending --Sputum culture if she can produce a sample --Check urine legionella and streptococcal antigens --Check procalcitonin level --Repeat lactic acid level now --Additional 500cc NS bolus now then NS at 100cc/hr for one additional liter.  HOLD HCTZ for now. --Wean oxygen during the day as  tolerated  Acute hypoxic respiratory failure and mild COPD exacerbation secondary to CAP --Solumedrol '60mg'$  IV q6h, wean as tolerated --Duoneb q4h while awake and q2h prn (HOLD spiriva for now) --Dulera substituted  for symbicort --Incentive spirometry, respiratory therapy --Mucinex DM BID as previously prescribed --Continue Singulair  UTI --Urine culture --Rocephin should cover for now  HLD --Atorvastatin  Cognitive impairment --Aricept  Neuropathic pain --Neurontin  Anxiety/Depression --Lexapro --PRN klonopin  She is on a baby aspirin at baseline.  DVT prophylaxis: SubQ Lovenox Code Status: FULL Family Communication: Sister present in the ED at time of admission. Disposition Plan: To be determined. Consults called: NONE Admission status: Inpatient, telemetry.  I expect this patient will need inpatient services for greater than two midnights.   TIME SPENT: 60 minutes   Eber Jones MD [facility] Pager [phone]  If 7PM-7AM, please contact night-coverage [email or web address] Password [facility]  12/05/2016, 7:52 PM

## 2016-12-05 NOTE — ED Triage Notes (Signed)
Pt BIB [facility] from [facility] c/o SOB. They sent her over to r/o pneumonia. Pt given 1 Duoneb at office. Sats 90% on RA. EKG normal with EMS. A&Ox4.

## 2016-12-05 NOTE — Progress Notes (Signed)
Subjective:    Patient ID: Michele Martin, female    DOB: 1946/03/30, 72 y.o.   MRN: [id number]  Chief Complaint  Patient presents with  . Shortness of Breath    SOB, cough, wheezing, nausea and throwing up all night last night, very weak    HPI:  Michele Martin is a 71 y.o. female who  has a past medical history of Acute angle-closure glaucoma (02/14/2010); ALLERGIC RHINITIS (09/19/2009); ANXIETY (04/15/2010); Arthritis; ASTHMA (09/19/2009); BACK PAIN (02/14/2010); Chronic bronchitis (04/14/2011); CHRONIC OBSTRUCTIVE PULMONARY DISEASE, ACUTE EXACERBATION (12/19/2009); Complication of anesthesia; COPD (06/27/2009); DEPRESSION (09/19/2009); HOARSENESS (10/17/2009); HYPERLIPIDEMIA (09/19/2009); MENOPAUSAL DISORDER (08/20/2010); OSTEOPENIA (09/10/2010); Osteoporosis, unspecified (04/20/2014); Rotator cuff tear (06/23/2011); TINNITUS (09/10/2010); TREMOR (02/14/2010); Tremor (07/30/2011); URI (09/10/2010); and Wheezing (06/27/2009). and presents today for an acute office visit.   This is a new problem. Associated symptoms of cough, shortness of breath, wheezing and nausea have been going on for about 24 hours. Denies fevers at home. Modifying factors include her prescribed inhalers which did not help very much. Not able to eat or drink well. Cough is productive.  Allergies  Allergen Reactions  . Pravastatin Itching and Other (See Comments)    Myalgias/cramping  . Simvastatin Itching and Other (See Comments)    Myalgias/cramping  . Statins Itching and Other (See Comments)    Myalgias/cramping      No facility-administered medications prior to visit.    Outpatient Medications Prior to Visit  Medication Sig Dispense Refill  . albuterol (PROVENTIL HFA) 108 (90 Base) MCG/ACT inhaler Inhale 2 puffs into the lungs every 6 (six) hours as needed for wheezing or shortness of breath. 18 g 5  . albuterol (PROVENTIL HFA;VENTOLIN HFA) 108 (90 BASE) MCG/ACT inhaler Inhale 2 puffs into the lungs every 6 (six)  hours as needed for wheezing. 1 Inhaler 11  . aspirin 81 MG EC tablet Take 81 mg by mouth daily.      [name] atorvastatin (LIPITOR) 20 MG tablet TAKE 1 TABLET BY MOUTH ONCE DAILY (Patient taking differently: TAKE '20mg'$  TABLET BY MOUTH ONCE in the evening) 90 tablet 3  . B Complex Vitamins (B-COMPLEX/B-12 PO) Take by mouth daily.    . budesonide-formoterol (SYMBICORT) 160-4.5 MCG/ACT inhaler Inhale 2 puffs into the lungs 2 (two) times daily. 3 Inhaler 3  . clonazePAM (KLONOPIN) 0.5 MG tablet Take 1 tablet (0.5 mg total) by mouth 2 (two) times daily as needed for anxiety. 60 tablet 5  . dextromethorphan-guaiFENesin (MUCINEX DM) 30-600 MG 12hr tablet Take 2 tablets by mouth 2 (two) times daily. 60 tablet 0  . diphenhydrAMINE (BENADRYL) 25 mg capsule Take 25 mg by mouth every 6 (six) hours as needed for allergies.    [name] donepezil (ARICEPT) 10 MG tablet Take 1 tablet daily (Patient taking differently: Take 10 mg by mouth at bedtime. ) 90 tablet 3  . escitalopram (LEXAPRO) 20 MG tablet Take 0.5 tablets (10 mg total) by mouth daily. 45 tablet 3  . hydrochlorothiazide (HYDRODIURIL) 25 MG tablet Take 1 tablet (25 mg total) by mouth daily. 90 tablet 3  . montelukast (SINGULAIR) 10 MG tablet TAKE 1 TABLET BY MOUTH EVERY NIGHT AT BEDTIME (Patient taking differently: TAKE '10mg'$  TABLET BY MOUTH EVERY NIGHT AT BEDTIME) 30 tablet 11  . predniSONE (DELTASONE) 10 MG tablet 3 tabs by mouth per day for 3 days,2tabs per day for 3 days,1tab per day for 3 days 18 tablet 0  . SYMBICORT 160-4.5 MCG/ACT inhaler INHALE 2 PUFFS BY MOUTH TWICE DAILY 30.6 g  1  . tiotropium (SPIRIVA HANDIHALER) 18 MCG inhalation capsule Place 1 capsule (18 mcg total) into inhaler and inhale daily. 90 capsule 3      Past Surgical History:  Procedure Laterality Date  . ABDOMINAL HYSTERECTOMY    . cervical fusion 2013 - Dr Vertell Limber    . Normal Heart Cath  2001   Dr. Johnsie Cancel  . s/p laser tx for glaucoma     no vision loss      Past Medical History:   Diagnosis Date  . Acute angle-closure glaucoma 02/14/2010  . ALLERGIC RHINITIS 09/19/2009  . ANXIETY 04/15/2010  . Arthritis   . ASTHMA 09/19/2009  . BACK PAIN 02/14/2010  . Chronic bronchitis 04/14/2011  . CHRONIC OBSTRUCTIVE PULMONARY DISEASE, ACUTE EXACERBATION 12/19/2009  . Complication of anesthesia   . COPD 06/27/2009  . DEPRESSION 09/19/2009  . HOARSENESS 10/17/2009  . HYPERLIPIDEMIA 09/19/2009  . MENOPAUSAL DISORDER 08/20/2010  . OSTEOPENIA 09/10/2010  . Osteoporosis, unspecified 04/20/2014  . Rotator cuff tear 06/23/2011  . TINNITUS 09/10/2010  . TREMOR 02/14/2010  . Tremor 07/30/2011  . URI 09/10/2010  . Wheezing 06/27/2009      Review of Systems  Constitutional: Positive for fever. Negative for chills.  HENT: Positive for congestion.   Respiratory: Positive for cough, chest tightness, shortness of breath and wheezing.   Cardiovascular: Positive for chest pain. Negative for palpitations and leg swelling.  Neurological: Positive for weakness.      Objective:    BP (!) 96/58 (BP Location: Left Arm, Patient Position: Sitting, Cuff Size: Normal)   Pulse (!) 116   Temp 99.5 F (37.5 C) (Oral)   Resp (!) 22   Ht '5\' 1"'$  (1.549 m)   Wt 112 lb (50.8 kg)   SpO2 90%   BMI 21.16 kg/m  Nursing note and vital signs reviewed.  BP Readings from Last 3 Encounters:  12/05/16 111/61  12/05/16 (!) 96/58  11/13/16 132/78    Physical Exam  Constitutional: She is oriented to person, place, and time. She appears well-developed and well-nourished. She has a sickly appearance. She appears ill. She appears distressed.  HENT:  Right Ear: Hearing, tympanic membrane, external ear and ear canal normal.  Left Ear: Hearing, tympanic membrane, external ear and ear canal normal.  Nose: Nose normal.  Mouth/Throat: Uvula is midline and oropharynx is clear and moist. Mucous membranes are dry.  Cardiovascular: Normal rate, regular rhythm, normal heart sounds and intact distal pulses.  Exam reveals no  gallop and no friction rub.   No murmur heard. Pulmonary/Chest: Effort normal. She has wheezes. She has rales.  Neurological: She is alert and oriented to person, place, and time.  Skin: Skin is warm and dry.  Warm to the touch  Psychiatric: She has a normal mood and affect. Her behavior is normal. Judgment and thought content normal.       Assessment & Plan:   Problem List Items Addressed This Visit      Respiratory   COPD exacerbation ([place]) - Primary    COPD exacerbation with concern for pneumonia or sepsis given vital signs and physical exam. Discussed need for emergent treatment given her condition. She was agreeable to go to the hospital. Duoneb provided in the office prior to EMS arrival. Patient remained stable throughout her office visit and upon leaving with EMS.       Relevant Medications   ipratropium-albuterol (DUONEB) 0.5-2.5 (3) MG/3ML nebulizer solution 3 mL (Completed)       I am having  Ms. Mill maintain her aspirin, albuterol, atorvastatin, budesonide-formoterol, montelukast, donepezil, dextromethorphan-guaiFENesin, diphenhydrAMINE, escitalopram, clonazePAM, B Complex Vitamins (B-COMPLEX/B-12 PO), tiotropium, hydrochlorothiazide, albuterol, predniSONE, and SYMBICORT. We administered ipratropium-albuterol.   Meds ordered this encounter  Medications  . ipratropium-albuterol (DUONEB) 0.5-2.5 (3) MG/3ML nebulizer solution 3 mL     Follow-up: Return if symptoms worsen or fail to improve.  Mauricio Po, FNP

## 2016-12-05 NOTE — Patient Instructions (Addendum)
Thank you for choosing [facility].  SUMMARY AND INSTRUCTIONS:  Recommend you go directly to the Emergency Room as it appears this is a COPD exacerbation with concern for pneumonia.   Follow up:  If your symptoms worsen or fail to improve, please contact our office for further instruction, or in case of emergency go directly to the emergency room at the closest medical facility.

## 2016-12-05 NOTE — ED Notes (Signed)
Patient transported to X-ray 

## 2016-12-05 NOTE — ED Notes (Signed)
Bed: GZ35 Expected date: 12/05/16 Expected time:  Means of arrival:  Comments: [facility]

## 2016-12-06 LAB — BASIC METABOLIC PANEL
Anion gap: 7 (ref 5–15)
BUN: 21 mg/dL — AB (ref 6–20)
CO2: 23 mmol/L (ref 22–32)
CREATININE: 0.53 mg/dL (ref 0.44–1.00)
Calcium: 7.2 mg/dL — ABNORMAL LOW (ref 8.9–10.3)
Chloride: 111 mmol/L (ref 101–111)
GFR calc Af Amer: >60 mL/min (ref >60)
GLUCOSE: 113 mg/dL — AB (ref 65–99)
Potassium: 3.3 mmol/L — ABNORMAL LOW (ref 3.5–5.1)
SODIUM: 141 mmol/L (ref 135–145)

## 2016-12-06 LAB — MRSA PCR SCREENING: MRSA by PCR: NEGATIVE

## 2016-12-06 LAB — CBC
HCT: 36.9 % (ref 36.0–46.0)
Hemoglobin: 11.9 g/dL — ABNORMAL LOW (ref 12.0–15.0)
MCH: 31.8 pg (ref 26.0–34.0)
MCHC: 32.2 g/dL (ref 30.0–36.0)
MCV: 98.7 fL (ref 78.0–100.0)
PLATELETS: 135 10*3/uL — AB (ref 150–400)
RBC: 3.74 MIL/uL — ABNORMAL LOW (ref 3.87–5.11)
RDW: 13 % (ref 11.5–15.5)
WBC: 6.8 10*3/uL (ref 4.0–10.5)

## 2016-12-06 LAB — PHOSPHORUS: Phosphorus: 3.2 mg/dL (ref 2.5–4.6)

## 2016-12-06 LAB — MAGNESIUM: Magnesium: 1.3 mg/dL — ABNORMAL LOW (ref 1.7–2.4)

## 2016-12-06 LAB — LACTIC ACID, PLASMA
LACTIC ACID, VENOUS: 4.1 mmol/L — AB (ref 0.5–1.9)
LACTIC ACID, VENOUS: 2.9 mmol/L — AB (ref 0.5–1.9)
LACTIC ACID, VENOUS: 2.9 mmol/L — AB (ref 0.5–1.9)

## 2016-12-06 LAB — STREP PNEUMONIAE URINARY ANTIGEN: Strep Pneumo Urinary Antigen: NEGATIVE

## 2016-12-06 MED ORDER — POTASSIUM CHLORIDE CRYS ER 20 MEQ PO TBCR
20.0000 meq | EXTENDED_RELEASE_TABLET | Freq: Once | ORAL | Status: AC
  Administered 2016-12-06: 20 meq via ORAL
  Filled 2016-12-06: qty 1

## 2016-12-06 MED ORDER — SODIUM CHLORIDE 0.9 % IV BOLUS (SEPSIS)
500.0000 mL | Freq: Once | INTRAVENOUS | Status: AC
  Administered 2016-12-06: 500 mL via INTRAVENOUS

## 2016-12-06 MED ORDER — POTASSIUM CHLORIDE IN NACL 40-0.9 MEQ/L-% IV SOLN
INTRAVENOUS | Status: DC
  Administered 2016-12-06 – 2016-12-07 (×2): 125 mL/h via INTRAVENOUS
  Filled 2016-12-06 (×4): qty 1000

## 2016-12-06 MED ORDER — MAGNESIUM SULFATE 2 GM/50ML IV SOLN
2.0000 g | Freq: Once | INTRAVENOUS | Status: AC
  Administered 2016-12-06: 2 g via INTRAVENOUS
  Filled 2016-12-06: qty 50

## 2016-12-06 MED ORDER — SODIUM CHLORIDE 0.9 % IV BOLUS (SEPSIS)
1000.0000 mL | Freq: Once | INTRAVENOUS | Status: AC
  Administered 2016-12-06: 1000 mL via INTRAVENOUS

## 2016-12-06 MED ORDER — SODIUM CHLORIDE 0.9 % IV BOLUS (SEPSIS): 500.0000 mL | Freq: Once | INTRAVENOUS | Status: DC

## 2016-12-06 NOTE — Progress Notes (Signed)
Pt's BP upon arrival to unit was 85/45, all other vitals remained stable. Pt asymptomatic, no changes in LOC. MD made aware. New orders placed for bolus. However, throughout shift pt continued to have drop in BP. MD placed order to transfer pt to step down. Report given to Loyal Gambler, Therapist, sports. All questions and concerns addressed. Pt brought to room 1229 by this RN.

## 2016-12-06 NOTE — Progress Notes (Signed)
PROGRESS NOTE    Michele Martin  AJO:[id number] DOB: 06-25-1946 DOA: 12/05/2016 PCP: Biagio Borg, MD    Brief Narrative:  71 y.o. woman with a history of COPD, OSA (she is not on CPAP at baseline but she is on 2L [place] qHS at home), asthma, osteoporosis, HLD, glaucoma, and anxiety/depression who presents to the ED via EMS from her PCP's office for evaluation of shortness of breath, nausea/vomiting, and fever.  The patient feels that she was in her baseline state of health until last night.  She developed nausea and vomiting and has not been able to tolerate any significant PO intake today.  She has also had subjective fever with sweats, but she denies chills.  She has had a productive cough, increased shortness of breath, and increased wheezing.  No known sick contacts.  No lower urinary tract symptoms.  No significant abdominal pain.    She received one duoneb in her PCP's office prior to transfer to the ED.  ED Course: The patient had a rectal temp of 103 with mild tachycardia and relative hypotension (systolic BP 94'B).  O2 sats 90% on RA, 95% with 2L [place].  Normal WBC count.  BUN 26 otherwise unremarkable CMP.  Troponin 0.01.  Lactic acid level 2.74.  Chest xray shows LUL and RLL infiltrates.  U/A shows large leukocytes, TNTC WBC, and many bacteria.  She has received 1L NS bolus, Solumedrol '125mg'$  IV x one, one continuous albuterol neb and one duoneb.  Blood cultures have been drawn.  She has received Rocephin and azithromycin.  Hospitalist asked to admit.  Assessment & Plan:   Principal Problem:   CAP (community acquired pneumonia) Active Problems:   Anxiety and depression   Severe sepsis with septic shock (HCC)   Acute lower UTI   Acute respiratory failure with hypoxia (HCC)   COPD with acute exacerbation (HCC)  Severe sepsis secondary to CAP --Patient has been continued on IV Rocephin and oral azithromcyin --Blood cultures pending --Sputum culture if she can produce a  sample --Urine strep neg --Lactate worsened overnight, peaked to over 4 --500cc NS bolus ordered given hypotension this AM --Diuretics on hold --Repeat lactate to ensure resolution  Acute hypoxic respiratory failure and mild COPD exacerbation secondary to CAP --Will continue Solumedrol '60mg'$  IV q6h, wean as tolerated --Duoneb q4h while awake and q2h prn (HOLD spiriva for now) --Dulera substituted for symbicort --Incentive spirometry, respiratory therapy --Will continue Mucinex DM BID as previously prescribed --Continue Singulair --currrently on 2LNC  UTI --Urine culture --Rocephin empirically ordered, will continue  HLD --Will continue Atorvastatin --Stable at this time  Cognitive impairment --Aricept continued --Stable  Neuropathic pain --Will continue Neurontin --Appears stable  Anxiety/Depression --Will continue Lexapro --Continue with PRN klonopin   DVT prophylaxis: Lovenox Subq Code Status: Full Family Communication: Pt in room, family not at bedside Disposition Plan: Uncertain at this time  Consultants:     Procedures:     Antimicrobials: Anti-infectives    Start     Dose/Rate Route Frequency Ordered Stop   12/06/16 1000  azithromycin (ZITHROMAX) tablet 500 mg     500 mg Oral Daily 12/05/16 1752 12/10/16 0959   12/06/16 0000  cefTRIAXone (ROCEPHIN) 1 g in dextrose 5 % 50 mL IVPB     1 g 100 mL/hr over 30 Minutes Intravenous Every 24 hours 12/05/16 1752     12/05/16 1745  cefTRIAXone (ROCEPHIN) 1 g in dextrose 5 % 50 mL IVPB     1 g 100  mL/hr over 30 Minutes Intravenous  Once 12/05/16 1741 12/05/16 1844   12/05/16 1745  azithromycin (ZITHROMAX) 500 mg in dextrose 5 % 250 mL IVPB     500 mg 250 mL/hr over 60 Minutes Intravenous  Once 12/05/16 1741 12/05/16 2000       Subjective: Feels thirsty  Objective: Vitals:   12/06/16 0901 12/06/16 1000 12/06/16 1200 12/06/16 1300  BP:  (!) 106/42 (!) 98/47 (!) 111/55  Pulse:  88 87 95  Resp:   (!) 24 (!) 22 (!) 22  Temp:   98.4 F (36.9 C)   TempSrc:   Oral   SpO2: 94% 93% 95% 94%  Weight:      Height:        Intake/Output Summary (Last 24 hours) at 12/06/16 1444 Last data filed at 12/06/16 0653  Gross per 24 hour  Intake             3125 ml  Output                0 ml  Net             3125 ml   Filed Weights   12/05/16 1738 12/05/16 2159  Weight: 51.3 kg (113 lb) 50.9 kg (112 lb 3.4 oz)    Examination:  General exam: Appears calm and comfortable  Respiratory system: decreased BS throughout, mild end-expiratory wheezing Cardiovascular system: S1 & S2 heard, RRR. Gastrointestinal system: Abdomen is nondistended, soft and nontender. No organomegaly or masses felt. Normal bowel sounds heard. Central nervous system: Alert and oriented. No focal neurological deficits. Extremities: Symmetric 5 x 5 power. Skin: No rashes, lesions Psychiatry: Judgement and insight appear normal. Mood & affect appropriate.   Data Reviewed: I have personally reviewed following labs and imaging studies  CBC:  Recent Labs Lab 12/05/16 1727 12/06/16 0416  WBC 9.1 6.8  NEUTROABS 7.6  --   HGB 14.7 11.9*  HCT 44.6 36.9  MCV 99.3 98.7  PLT 171 474*   Basic Metabolic Panel:  Recent Labs Lab 12/05/16 1727 12/06/16 0416  NA 140 141  K 3.7 3.3*  CL 98* 111  CO2 30 23  GLUCOSE 90 113*  BUN 26* 21*  CREATININE 1.00 0.53  CALCIUM 9.0 7.2*  MG  --  1.3*  PHOS  --  3.2   GFR: Estimated Creatinine Clearance: 48 mL/min (by C-G formula based on SCr of 0.53 mg/dL). Liver Function Tests:  Recent Labs Lab 12/05/16 1727  AST 34  ALT 26  ALKPHOS 50  BILITOT 1.1  PROT 6.6  ALBUMIN 3.6    Recent Labs Lab 12/05/16 1727  LIPASE 13   No results for input(s): AMMONIA in the last 168 hours. Coagulation Profile: No results for input(s): INR, PROTIME in the last 168 hours. Cardiac Enzymes: No results for input(s): CKTOTAL, CKMB, CKMBINDEX, TROPONINI in the last 168 hours. BNP  (last 3 results) No results for input(s): PROBNP in the last 8760 hours. HbA1C: No results for input(s): HGBA1C in the last 72 hours. CBG: No results for input(s): GLUCAP in the last 168 hours. Lipid Profile: No results for input(s): CHOL, HDL, LDLCALC, TRIG, CHOLHDL, LDLDIRECT in the last 72 hours. Thyroid Function Tests: No results for input(s): TSH, T4TOTAL, FREET4, T3FREE, THYROIDAB in the last 72 hours. Anemia Panel: No results for input(s): VITAMINB12, FOLATE, FERRITIN, TIBC, IRON, RETICCTPCT in the last 72 hours. Sepsis Labs:  Recent Labs Lab 12/05/16 1735 12/05/16 2142 12/05/16 2256 12/06/16 0204  PROCALCITON  --   --  8.04  --   LATICACIDVEN 2.74* 2.8*  --  4.1*    Recent Results (from the past 240 hour(s))  Blood Culture (routine x 2)     Status: None (Preliminary result)   Collection Time: 12/05/16  5:27 PM  Result Value Ref Range Status   Specimen Description RIGHT ANTECUBITAL  Final   Special Requests   Final    BOTTLES DRAWN AEROBIC AND ANAEROBIC Blood Culture adequate volume   Culture   Final    NO GROWTH < 24 HOURS Performed at [facility], [address] [place], [place] [address]    Report Status PENDING  Incomplete  Blood Culture (routine x 2)     Status: None (Preliminary result)   Collection Time: 12/05/16  5:27 PM  Result Value Ref Range Status   Specimen Description LEFT ANTECUBITAL  Final   Special Requests   Final    BOTTLES DRAWN AEROBIC AND ANAEROBIC Blood Culture adequate volume   Culture   Final    NO GROWTH < 24 HOURS Performed at [facility], [place] [address] [place], [place] [address]    Report Status PENDING  Incomplete  MRSA PCR Screening     Status: None   Collection Time: 12/06/16  3:14 AM  Result Value Ref Range Status   MRSA by PCR NEGATIVE NEGATIVE Final    Comment:        The GeneXpert MRSA Assay (FDA approved for NASAL specimens only), is one component of a comprehensive MRSA colonization surveillance  program. It is not intended to diagnose MRSA infection nor to guide or monitor treatment for MRSA infections.      Radiology Studies: Dg Chest 2 View  Result Date: 12/05/2016 CLINICAL DATA:  Shortness of breath question pneumonia, history asthma, COPD, chronic bronchitis, former smoker EXAM: CHEST  2 VIEW COMPARISON:  08/04/2016 FINDINGS: Normal heart size, mediastinal contours, and pulmonary vascularity. Atherosclerotic calcification aorta. Extensive infiltrate throughout LEFT upper lobe and probably within RIGHT lower lobe as well consistent with pneumonia. No pleural effusion or pneumothorax. Underlying emphysematous changes present. Bones demineralized with evidence of prior cervical spine surgery. IMPRESSION: COPD changes with LEFT upper lobe and RIGHT lower lobe infiltrates consistent with pneumonia. Aortic atherosclerosis. Electronically Signed   By: Lavonia Dana M.D.   On: 12/05/2016 17:25    Scheduled Meds: . aspirin EC  81 mg Oral QHS  . atorvastatin  20 mg Oral QHS  . azithromycin  500 mg Oral Daily  . dextromethorphan-guaiFENesin  2 tablet Oral BID  . donepezil  10 mg Oral QHS  . enoxaparin (LOVENOX) injection  40 mg Subcutaneous Q24H  . escitalopram  10 mg Oral Daily  . gabapentin  100 mg Oral TID  . ipratropium-albuterol  3 mL Nebulization Q4H WA  . methylPREDNISolone (SOLU-MEDROL) injection  60 mg Intravenous Q6H  . mometasone-formoterol  2 puff Inhalation BID  . montelukast  10 mg Oral QHS   Continuous Infusions: . 0.9 % NaCl with KCl 40 mEq / L 125 mL/hr (12/06/16 0653)  . cefTRIAXone (ROCEPHIN)  IV Stopped (12/05/16 2338)     LOS: 1 day   Phuong Hillary, Orpah Melter, MD [facility] Pager [phone]  If 7PM-7AM, please contact night-coverage [email or web address] Password [email or web address] 12/06/2016, 2:44 PM

## 2016-12-06 NOTE — Progress Notes (Signed)
CRITICAL VALUE ALERT  Critical value received:  Lactic acid   Date of notification:  12/06/16  Time of notification:  5300  Critical value read back:Yes.    Nurse who received alert:  Leona Carry  MD notified (1st page):  Olevia Bowens   Time of first page:  [phone]

## 2016-12-07 LAB — URINE CULTURE

## 2016-12-07 LAB — BASIC METABOLIC PANEL
Anion gap: 7 (ref 5–15)
BUN: 19 mg/dL (ref 6–20)
CHLORIDE: 113 mmol/L — AB (ref 101–111)
CO2: 20 mmol/L — ABNORMAL LOW (ref 22–32)
Calcium: 7.6 mg/dL — ABNORMAL LOW (ref 8.9–10.3)
Creatinine, Ser: 0.46 mg/dL (ref 0.44–1.00)
GFR calc Af Amer: >60 mL/min (ref >60)
Glucose, Bld: 134 mg/dL — ABNORMAL HIGH (ref 65–99)
Potassium: 4.4 mmol/L (ref 3.5–5.1)
Sodium: 140 mmol/L (ref 135–145)

## 2016-12-07 LAB — LACTIC ACID, PLASMA
LACTIC ACID, VENOUS: 1.3 mmol/L (ref 0.5–1.9)
Lactic Acid, Venous: 2.6 mmol/L (ref 0.5–1.9)

## 2016-12-07 LAB — MAGNESIUM: MAGNESIUM: 2.3 mg/dL (ref 1.7–2.4)

## 2016-12-07 MED ORDER — ENSURE ENLIVE PO LIQD
237.0000 mL | Freq: Two times a day (BID) | ORAL | Status: DC
  Administered 2016-12-08 – 2016-12-12 (×8): 237 mL via ORAL

## 2016-12-07 MED ORDER — SODIUM CHLORIDE 0.9 % IV BOLUS (SEPSIS)
250.0000 mL | Freq: Once | INTRAVENOUS | Status: AC
  Administered 2016-12-07: 250 mL via INTRAVENOUS

## 2016-12-07 MED ORDER — VITAMINS A & D EX OINT
TOPICAL_OINTMENT | CUTANEOUS | Status: AC
  Administered 2016-12-07: 1
  Filled 2016-12-07: qty 5

## 2016-12-07 NOTE — Progress Notes (Signed)
PROGRESS NOTE    Michele Martin  JWJ:[id number] DOB: 09-16-1945 DOA: 12/05/2016 PCP: Biagio Borg, MD    Brief Narrative:  71 y.o. woman with a history of COPD, OSA (she is not on CPAP at baseline but she is on 2L [place] qHS at home), asthma, osteoporosis, HLD, glaucoma, and anxiety/depression who presents to the ED via EMS from her PCP's office for evaluation of shortness of breath, nausea/vomiting, and fever.  The patient feels that she was in her baseline state of health until last night.  She developed nausea and vomiting and has not been able to tolerate any significant PO intake today.  She has also had subjective fever with sweats, but she denies chills.  She has had a productive cough, increased shortness of breath, and increased wheezing.  No known sick contacts.  No lower urinary tract symptoms.  No significant abdominal pain.    She received one duoneb in her PCP's office prior to transfer to the ED.  ED Course: The patient had a rectal temp of 103 with mild tachycardia and relative hypotension (systolic BP 62'Z).  O2 sats 90% on RA, 95% with 2L [place].  Normal WBC count.  BUN 26 otherwise unremarkable CMP.  Troponin 0.01.  Lactic acid level 2.74.  Chest xray shows LUL and RLL infiltrates.  U/A shows large leukocytes, TNTC WBC, and many bacteria.  She has received 1L NS bolus, Solumedrol '125mg'$  IV x one, one continuous albuterol neb and one duoneb.  Blood cultures have been drawn.  She has received Rocephin and azithromycin.  Hospitalist asked to admit.  Assessment & Plan:   Principal Problem:   CAP (community acquired pneumonia) Active Problems:   Anxiety and depression   Severe sepsis with septic shock (HCC)   Acute lower UTI   Acute respiratory failure with hypoxia (HCC)   COPD with acute exacerbation (HCC)  Severe sepsis secondary to CAP --Patient has been continued on IV Rocephin and oral azithromcyin --Blood cultures pending --Sputum culture if she can produce a  sample --Urine strep neg --Lactate peaked to over 4, now resolved with after IVF hydration --Hold further IVF for now  Acute hypoxic respiratory failure and mild COPD exacerbation secondary to CAP --Patient continued on Solumedrol '60mg'$  IV q6h, will wean as tolerated --Duoneb q4h while awake and q2h prn (HOLD spiriva for now) --Dulera substituted for symbicort --Incentive spirometry, respiratory therapy --Will continue Mucinex DM BID as previously prescribed --Continue Singulair --currrently on 2LNC  UTI --Urine culture --for now, will cont empiric rocephin  HLD --Plan to continue Atorvastatin --Stable at this time  Cognitive impairment --Aricept continued --remains stable  Neuropathic pain --Pt to continue Neurontin --Appears stable  Anxiety/Depression --Will continue Lexapro --Will continue with PRN klonopin   DVT prophylaxis: Lovenox Subq Code Status: Full Family Communication: Pt in room, family not at bedside Disposition Plan: Uncertain at this time  Consultants:     Procedures:     Antimicrobials: Anti-infectives    Start     Dose/Rate Route Frequency Ordered Stop   12/06/16 1000  azithromycin (ZITHROMAX) tablet 500 mg     500 mg Oral Daily 12/05/16 1752 12/10/16 0959   12/06/16 0000  cefTRIAXone (ROCEPHIN) 1 g in dextrose 5 % 50 mL IVPB     1 g 100 mL/hr over 30 Minutes Intravenous Every 24 hours 12/05/16 1752     12/05/16 1745  cefTRIAXone (ROCEPHIN) 1 g in dextrose 5 % 50 mL IVPB     1 g 100  mL/hr over 30 Minutes Intravenous  Once 12/05/16 1741 12/05/16 1844   12/05/16 1745  azithromycin (ZITHROMAX) 500 mg in dextrose 5 % 250 mL IVPB     500 mg 250 mL/hr over 60 Minutes Intravenous  Once 12/05/16 1741 12/05/16 2000      Subjective: States feeling better, however still thirsty  Objective: Vitals:   12/07/16 0811 12/07/16 0900 12/07/16 1200 12/07/16 1254  BP:  128/66  126/65  Pulse:  93  87  Resp:  16  20  Temp:   98.3 F (36.8 C)    TempSrc:   Oral   SpO2: 95% (!) 89%  93%  Weight:      Height:        Intake/Output Summary (Last 24 hours) at 12/07/16 1413 Last data filed at 12/07/16 1400  Gross per 24 hour  Intake          4189.58 ml  Output              400 ml  Net          3789.58 ml   Filed Weights   12/05/16 1738 12/05/16 2159  Weight: 51.3 kg (113 lb) 50.9 kg (112 lb 3.4 oz)    Examination:  General exam: Awake, laying in bed, in nad Respiratory system: Normal respiratory effort, coarse breath sounds, end-expiratory wheezing Cardiovascular system: regular rate, s1, s2 Gastrointestinal system: Soft, nondistended, positive BS Central nervous system: CN2-12 grossly intact, strength intact Extremities: Perfused, no clubbing Skin: Normal skin turgor, no notable skin lesions seen Psychiatry: Mood normal // no visual hallucinations     Data Reviewed: I have personally reviewed following labs and imaging studies  CBC:  Recent Labs Lab 12/05/16 1727 12/06/16 0416  WBC 9.1 6.8  NEUTROABS 7.6  --   HGB 14.7 11.9*  HCT 44.6 36.9  MCV 99.3 98.7  PLT 171 782*   Basic Metabolic Panel:  Recent Labs Lab 12/05/16 1727 12/06/16 0416 12/07/16 0351  NA 140 141 140  K 3.7 3.3* 4.4  CL 98* 111 113*  CO2 30 23 20*  GLUCOSE 90 113* 134*  BUN 26* 21* 19  CREATININE 1.00 0.53 0.46  CALCIUM 9.0 7.2* 7.6*  MG  --  1.3* 2.3  PHOS  --  3.2  --    GFR: Estimated Creatinine Clearance: 48 mL/min (by C-G formula based on SCr of 0.46 mg/dL). Liver Function Tests:  Recent Labs Lab 12/05/16 1727  AST 34  ALT 26  ALKPHOS 50  BILITOT 1.1  PROT 6.6  ALBUMIN 3.6    Recent Labs Lab 12/05/16 1727  LIPASE 13   No results for input(s): AMMONIA in the last 168 hours. Coagulation Profile: No results for input(s): INR, PROTIME in the last 168 hours. Cardiac Enzymes: No results for input(s): CKTOTAL, CKMB, CKMBINDEX, TROPONINI in the last 168 hours. BNP (last 3 results) No results for input(s):  PROBNP in the last 8760 hours. HbA1C: No results for input(s): HGBA1C in the last 72 hours. CBG: No results for input(s): GLUCAP in the last 168 hours. Lipid Profile: No results for input(s): CHOL, HDL, LDLCALC, TRIG, CHOLHDL, LDLDIRECT in the last 72 hours. Thyroid Function Tests: No results for input(s): TSH, T4TOTAL, FREET4, T3FREE, THYROIDAB in the last 72 hours. Anemia Panel: No results for input(s): VITAMINB12, FOLATE, FERRITIN, TIBC, IRON, RETICCTPCT in the last 72 hours. Sepsis Labs:  Recent Labs Lab 12/05/16 2256  12/06/16 1423 12/06/16 1704 12/07/16 0942 12/07/16 1259  PROCALCITON 8.04  --   --   --   --   --  LATICACIDVEN  --   < > 2.9* 2.9* 2.6* 1.3  < > = values in this interval not displayed.  Recent Results (from the past 240 hour(s))  Blood Culture (routine x 2)     Status: None (Preliminary result)   Collection Time: 12/05/16  5:27 PM  Result Value Ref Range Status   Specimen Description RIGHT ANTECUBITAL  Final   Special Requests   Final    BOTTLES DRAWN AEROBIC AND ANAEROBIC Blood Culture adequate volume   Culture   Final    NO GROWTH 2 DAYS Performed at [facility], [address], [place], [place] [address]    Report Status PENDING  Incomplete  Blood Culture (routine x 2)     Status: None (Preliminary result)   Collection Time: 12/05/16  5:27 PM  Result Value Ref Range Status   Specimen Description LEFT ANTECUBITAL  Final   Special Requests   Final    BOTTLES DRAWN AEROBIC AND ANAEROBIC Blood Culture adequate volume   Culture   Final    NO GROWTH 2 DAYS Performed at [facility], [place] [address], [place], [place] [address]    Report Status PENDING  Incomplete  Urine culture     Status: Abnormal   Collection Time: 12/05/16  5:27 PM  Result Value Ref Range Status   Specimen Description URINE, RANDOM  Final   Special Requests NONE  Final   Culture MULTIPLE SPECIES PRESENT, SUGGEST RECOLLECTION (A)  Final   Report Status 12/07/2016  FINAL  Final  MRSA PCR Screening     Status: None   Collection Time: 12/06/16  3:14 AM  Result Value Ref Range Status   MRSA by PCR NEGATIVE NEGATIVE Final    Comment:        The GeneXpert MRSA Assay (FDA approved for NASAL specimens only), is one component of a comprehensive MRSA colonization surveillance program. It is not intended to diagnose MRSA infection nor to guide or monitor treatment for MRSA infections.      Radiology Studies: Dg Chest 2 View  Result Date: 12/05/2016 CLINICAL DATA:  Shortness of breath question pneumonia, history asthma, COPD, chronic bronchitis, former smoker EXAM: CHEST  2 VIEW COMPARISON:  08/04/2016 FINDINGS: Normal heart size, mediastinal contours, and pulmonary vascularity. Atherosclerotic calcification aorta. Extensive infiltrate throughout LEFT upper lobe and probably within RIGHT lower lobe as well consistent with pneumonia. No pleural effusion or pneumothorax. Underlying emphysematous changes present. Bones demineralized with evidence of prior cervical spine surgery. IMPRESSION: COPD changes with LEFT upper lobe and RIGHT lower lobe infiltrates consistent with pneumonia. Aortic atherosclerosis. Electronically Signed   By: Lavonia Dana M.D.   On: 12/05/2016 17:25    Scheduled Meds: . aspirin EC  81 mg Oral QHS  . atorvastatin  20 mg Oral QHS  . azithromycin  500 mg Oral Daily  . dextromethorphan-guaiFENesin  2 tablet Oral BID  . donepezil  10 mg Oral QHS  . enoxaparin (LOVENOX) injection  40 mg Subcutaneous Q24H  . escitalopram  10 mg Oral Daily  . feeding supplement (ENSURE ENLIVE)  237 mL Oral BID BM  . gabapentin  100 mg Oral TID  . ipratropium-albuterol  3 mL Nebulization Q4H WA  . methylPREDNISolone (SOLU-MEDROL) injection  60 mg Intravenous Q6H  . mometasone-formoterol  2 puff Inhalation BID  . montelukast  10 mg Oral QHS   Continuous Infusions: . cefTRIAXone (ROCEPHIN)  IV Stopped (12/07/16 0058)     LOS: 2 days  Kristine Chahal, Orpah Melter, MD [facility] Pager [phone]  If 7PM-7AM, please contact night-coverage [email or web address] Password [email or web address] 12/07/2016, 2:13 PM

## 2016-12-07 NOTE — Progress Notes (Signed)
Initial Nutrition Assessment  INTERVENTION:   Provide Ensure Enlive po BID, each supplement provides 350 kcal and 20 grams of protein RD to continue to monitor  NUTRITION DIAGNOSIS:   Increased nutrient needs related to other (see comment) (COPD) as evidenced by estimated needs.  GOAL:   Patient will meet greater than or equal to 90% of their needs  MONITOR:   PO intake, Supplement acceptance, Labs, Weight trends, I & O's  REASON FOR ASSESSMENT:   Consult COPD Protocol  ASSESSMENT:   71 y.o. woman with a history of COPD, OSA (she is not on CPAP at baseline but she is on 2L [place] qHS at home), asthma, osteoporosis, HLD, glaucoma, and anxiety/depression who presents to the ED via EMS from her PCP's office for evaluation of shortness of breath, nausea/vomiting, and fever.  The patient feels that she was in her baseline state of health until last night.  She developed nausea and vomiting and has not been able to tolerate any significant PO intake today.  She has also had subjective fever with sweats, but she denies chills.  She has had a productive cough, increased shortness of breath, and increased wheezing.  No known sick contacts.  No lower urinary tract symptoms.  No significant abdominal pain.    Patient in room with family and staff member who was assessing patient at time of visit. Patient was assessed by nutrition team in January, at that time pt had exhibited weight loss.  Per chart review, pt has gained weight since January. Will order ensure supplements given increased needs.   Unable to complete Nutrition-Focused physical exam at this time.   Labs reviewed. Medications reviewed.  Diet Order:  Diet Heart Room service appropriate? Yes; Fluid consistency: Thin  Skin:  Reviewed, no issues  Last BM:  5/12  Height:   Ht Readings from Last 1 Encounters:  12/05/16 '5\' 1"'$  (1.549 m)    Weight:   Wt Readings from Last 1 Encounters:  12/05/16 112 lb 3.4 oz (50.9 kg)     Ideal Body Weight:  47.7 kg  BMI:  Body mass index is 21.2 kg/m.  Estimated Nutritional Needs:   Kcal:  1500-1700  Protein:  70-80g  Fluid:  1.7L/day  EDUCATION NEEDS:   No education needs identified at this time  Clayton Bibles, MS, RD, LDN Pager: [phone] After Hours Pager: [phone]

## 2016-12-07 NOTE — Progress Notes (Signed)
Pt arrived from ICU in w/c. Stood/pivot to bed w/ standby assist. VSS. Denies pain/discomfort. Mild DOE noted w/ transfer. Pt oriented to callbell and environment.POC discussed.

## 2016-12-08 LAB — CBC
HEMATOCRIT: 37.4 % (ref 36.0–46.0)
Hemoglobin: 11.9 g/dL — ABNORMAL LOW (ref 12.0–15.0)
MCH: 32.2 pg (ref 26.0–34.0)
MCHC: 31.8 g/dL (ref 30.0–36.0)
MCV: 101.1 fL — AB (ref 78.0–100.0)
Platelets: 188 10*3/uL (ref 150–400)
RBC: 3.7 MIL/uL — ABNORMAL LOW (ref 3.87–5.11)
RDW: 13.5 % (ref 11.5–15.5)
WBC: 8.5 10*3/uL (ref 4.0–10.5)

## 2016-12-08 LAB — BASIC METABOLIC PANEL
Anion gap: 8 (ref 5–15)
BUN: 18 mg/dL (ref 6–20)
CO2: 24 mmol/L (ref 22–32)
CREATININE: 0.48 mg/dL (ref 0.44–1.00)
Calcium: 7.9 mg/dL — ABNORMAL LOW (ref 8.9–10.3)
Chloride: 110 mmol/L (ref 101–111)
GFR calc Af Amer: >60 mL/min (ref >60)
GFR calc non Af Amer: >60 mL/min (ref >60)
GLUCOSE: 118 mg/dL — AB (ref 65–99)
POTASSIUM: 4.9 mmol/L (ref 3.5–5.1)
SODIUM: 142 mmol/L (ref 135–145)

## 2016-12-08 LAB — LEGIONELLA PNEUMOPHILA SEROGP 1 UR AG: L. pneumophila Serogp 1 Ur Ag: NEGATIVE

## 2016-12-08 NOTE — Progress Notes (Signed)
RT was giving patient her inhaler. Patient was swishing out her mouth and got strangled on some water. Patient stated she was fine and that it helped her bring some sputum up. RT notified the nurse.

## 2016-12-08 NOTE — Progress Notes (Addendum)
PROGRESS NOTE    Michele Martin  MVH:[id number] DOB: [birth year] DOA: 12/05/2016 PCP: Biagio Borg, MD    Brief Narrative:  71 y.o. woman with a history of COPD, OSA (she is not on CPAP at baseline but she is on 2L [place] qHS at home), asthma, osteoporosis, HLD, glaucoma, and anxiety/depression who presents to the ED via EMS from her PCP's office for evaluation of shortness of breath, nausea/vomiting, and fever.  The patient feels that she was in her baseline state of health until last night.  She developed nausea and vomiting and has not been able to tolerate any significant PO intake today.  She has also had subjective fever with sweats, but she denies chills.  She has had a productive cough, increased shortness of breath, and increased wheezing.  No known sick contacts.  No lower urinary tract symptoms.  No significant abdominal pain.    She received one duoneb in her PCP's office prior to transfer to the ED.  ED Course: The patient had a rectal temp of 103 with mild tachycardia and relative hypotension (systolic BP 84'X).  O2 sats 90% on RA, 95% with 2L [place].  Normal WBC count.  BUN 26 otherwise unremarkable CMP.  Troponin 0.01.  Lactic acid level 2.74.  Chest xray shows LUL and RLL infiltrates.  U/A shows large leukocytes, TNTC WBC, and many bacteria.  She has received 1L NS bolus, Solumedrol '125mg'$  IV x one, one continuous albuterol neb and one duoneb.  Blood cultures have been drawn.  She has received Rocephin and azithromycin.  Hospitalist asked to admit.  During this hospital course, patient improved with IVF with lactate normalizing. Patient continued with wheezing and is continued on nebs and steroids. Continuing to wean O2  Assessment & Plan:   Principal Problem:   CAP (community acquired pneumonia) Active Problems:   Anxiety and depression   Severe sepsis with septic shock (HCC)   Acute lower UTI   Acute respiratory failure with hypoxia (HCC)   COPD with acute exacerbation  (HCC)  Severe sepsis secondary to CAP --Patient has been continued on IV Rocephin and oral azithromcyin --Blood cultures pending --Sputum culture if she can produce a sample --Urine strep neg --Lactate peaked to over 4, since resolved following IVF hydration --Sepsis physiology resolved  Acute hypoxic respiratory failure and mild COPD exacerbation secondary to CAP --Baseline on room air with nocturnal O2 as needed --Patient continued on Solumedrol '60mg'$  IV q6h, will wean as tolerated --Duoneb q4h while awake and q2h prn (HOLD spiriva for now) --Dulera substituted for symbicort --Incentive spirometry, respiratory therapy --Will continue Mucinex DM BID as previously prescribed --Continue Singulair --Remains O2 dependent. Continue to wean O2 as tolerated  UTI --Urine culture reviewed, demonstrating multiple species present --for now, will cont abx per above  HLD --Plan to continue Atorvastatin --Remains stable  Cognitive impairment --Aricept has been continued --currently stable  Neuropathic pain --Pt continued on Neurontin --Currently stable  Anxiety/Depression --Will continue Lexapro --Plan to continue PRN klonopin for anxiety  DVT prophylaxis: Lovenox Subq Code Status: Full Family Communication: Pt in room, family not at bedside Disposition Plan: Uncertain at this time  Consultants:     Procedures:     Antimicrobials: Anti-infectives    Start     Dose/Rate Route Frequency Ordered Stop   12/06/16 1000  azithromycin (ZITHROMAX) tablet 500 mg     500 mg Oral Daily 12/05/16 1752 12/10/16 0959   12/06/16 0000  cefTRIAXone (ROCEPHIN) 1 g in dextrose 5 %  50 mL IVPB     1 g 100 mL/hr over 30 Minutes Intravenous Every 24 hours 12/05/16 1752     12/05/16 1745  cefTRIAXone (ROCEPHIN) 1 g in dextrose 5 % 50 mL IVPB     1 g 100 mL/hr over 30 Minutes Intravenous  Once 12/05/16 1741 12/05/16 1844   12/05/16 1745  azithromycin (ZITHROMAX) 500 mg in dextrose 5 %  250 mL IVPB     500 mg 250 mL/hr over 60 Minutes Intravenous  Once 12/05/16 1741 12/05/16 2000      Subjective: Reports feeling better, however did have increased coughing spell earlier this AM that was productive of thick sputum  Objective: Vitals:   12/08/16 0514 12/08/16 1120 12/08/16 1122 12/08/16 1155  BP: 125/62     Pulse: 77     Resp: 16     Temp: 97.7 F (36.5 C)     TempSrc: Oral     SpO2: 97% (!) 80% 93% 98%  Weight:      Height:        Intake/Output Summary (Last 24 hours) at 12/08/16 1314 Last data filed at 12/07/16 1848  Gross per 24 hour  Intake              185 ml  Output              300 ml  Net             -115 ml   Filed Weights   12/05/16 1738 12/05/16 2159  Weight: 51.3 kg (113 lb) 50.9 kg (112 lb 3.4 oz)    Examination:  General exam: Conversant, in no acute distress Respiratory system: mildly increased resp effort, end-expiratory wheezing throughout Cardiovascular system: regular rhythm, s1-s2 Gastrointestinal system: Nondistended, nontender, pos BS Central nervous system: No seizures, no tremors Extremities: No cyanosis, no joint deformities Skin: No rashes, no pallor Psychiatry: Affect normal // no auditory hallucinations   Data Reviewed: I have personally reviewed following labs and imaging studies  CBC:  Recent Labs Lab 12/05/16 1727 12/06/16 0416 12/08/16 0421  WBC 9.1 6.8 8.5  NEUTROABS 7.6  --   --   HGB 14.7 11.9* 11.9*  HCT 44.6 36.9 37.4  MCV 99.3 98.7 101.1*  PLT 171 135* 761   Basic Metabolic Panel:  Recent Labs Lab 12/05/16 1727 12/06/16 0416 12/07/16 0351 12/08/16 0421  NA 140 141 140 142  K 3.7 3.3* 4.4 4.9  CL 98* 111 113* 110  CO2 30 23 20* 24  GLUCOSE 90 113* 134* 118*  BUN 26* 21* 19 18  CREATININE 1.00 0.53 0.46 0.48  CALCIUM 9.0 7.2* 7.6* 7.9*  MG  --  1.3* 2.3  --   PHOS  --  3.2  --   --    GFR: Estimated Creatinine Clearance: 48 mL/min (by C-G formula based on SCr of 0.48 mg/dL). Liver  Function Tests:  Recent Labs Lab 12/05/16 1727  AST 34  ALT 26  ALKPHOS 50  BILITOT 1.1  PROT 6.6  ALBUMIN 3.6    Recent Labs Lab 12/05/16 1727  LIPASE 13   No results for input(s): AMMONIA in the last 168 hours. Coagulation Profile: No results for input(s): INR, PROTIME in the last 168 hours. Cardiac Enzymes: No results for input(s): CKTOTAL, CKMB, CKMBINDEX, TROPONINI in the last 168 hours. BNP (last 3 results) No results for input(s): PROBNP in the last 8760 hours. HbA1C: No results for input(s): HGBA1C in the last 72 hours. CBG: No  results for input(s): GLUCAP in the last 168 hours. Lipid Profile: No results for input(s): CHOL, HDL, LDLCALC, TRIG, CHOLHDL, LDLDIRECT in the last 72 hours. Thyroid Function Tests: No results for input(s): TSH, T4TOTAL, FREET4, T3FREE, THYROIDAB in the last 72 hours. Anemia Panel: No results for input(s): VITAMINB12, FOLATE, FERRITIN, TIBC, IRON, RETICCTPCT in the last 72 hours. Sepsis Labs:  Recent Labs Lab 12/05/16 2256  12/06/16 1423 12/06/16 1704 12/07/16 0942 12/07/16 1259  PROCALCITON 8.04  --   --   --   --   --   LATICACIDVEN  --   < > 2.9* 2.9* 2.6* 1.3  < > = values in this interval not displayed.  Recent Results (from the past 240 hour(s))  Blood Culture (routine x 2)     Status: None (Preliminary result)   Collection Time: 12/05/16  5:27 PM  Result Value Ref Range Status   Specimen Description RIGHT ANTECUBITAL  Final   Special Requests   Final    BOTTLES DRAWN AEROBIC AND ANAEROBIC Blood Culture adequate volume   Culture   Final    NO GROWTH 3 DAYS Performed at [facility], [address] [place], [place] [address]    Report Status PENDING  Incomplete  Blood Culture (routine x 2)     Status: None (Preliminary result)   Collection Time: 12/05/16  5:27 PM  Result Value Ref Range Status   Specimen Description LEFT ANTECUBITAL  Final   Special Requests   Final    BOTTLES DRAWN AEROBIC AND ANAEROBIC  Blood Culture adequate volume   Culture   Final    NO GROWTH 3 DAYS Performed at [facility], [address], [place], [place] [address]    Report Status PENDING  Incomplete  Urine culture     Status: Abnormal   Collection Time: 12/05/16  5:27 PM  Result Value Ref Range Status   Specimen Description URINE, RANDOM  Final   Special Requests NONE  Final   Culture MULTIPLE SPECIES PRESENT, SUGGEST RECOLLECTION (A)  Final   Report Status 12/07/2016 FINAL  Final  MRSA PCR Screening     Status: None   Collection Time: 12/06/16  3:14 AM  Result Value Ref Range Status   MRSA by PCR NEGATIVE NEGATIVE Final    Comment:        The GeneXpert MRSA Assay (FDA approved for NASAL specimens only), is one component of a comprehensive MRSA colonization surveillance program. It is not intended to diagnose MRSA infection nor to guide or monitor treatment for MRSA infections.      Radiology Studies: No results found.  Scheduled Meds: . aspirin EC  81 mg Oral QHS  . atorvastatin  20 mg Oral QHS  . azithromycin  500 mg Oral Daily  . dextromethorphan-guaiFENesin  2 tablet Oral BID  . donepezil  10 mg Oral QHS  . enoxaparin (LOVENOX) injection  40 mg Subcutaneous Q24H  . escitalopram  10 mg Oral Daily  . feeding supplement (ENSURE ENLIVE)  237 mL Oral BID BM  . gabapentin  100 mg Oral TID  . ipratropium-albuterol  3 mL Nebulization Q4H WA  . methylPREDNISolone (SOLU-MEDROL) injection  60 mg Intravenous Q6H  . mometasone-formoterol  2 puff Inhalation BID  . montelukast  10 mg Oral QHS   Continuous Infusions: . cefTRIAXone (ROCEPHIN)  IV Stopped (12/08/16 0120)     LOS: 3 days   Honestii Marton, Orpah Melter, MD [facility] Pager [phone]  If 7PM-7AM, please  contact night-coverage [email or web address] Password [email or web address] 12/08/2016, 1:14 PM

## 2016-12-08 NOTE — Evaluation (Signed)
Physical Therapy Evaluation Patient Details Name: Michele Martin MRN: [id number] DOB: [birth year] Today's Date: 12/08/2016   History of Present Illness  71 y.o. woman with a history of COPD, OSA (she is not on CPAP at baseline but she is on 2L [place] qHS at home), asthma, osteoporosis, HLD, glaucoma, and anxiety/depression who presents to the ED via EMS from her PCP's office for evaluation of shortness of breath, nausea/vomiting, and fever-->dx:CAP  Clinical Impression  Pt admitted with above diagnosis. Pt currently with functional limitations due to the deficits listed below (see PT Problem List). * Pt will benefit from skilled PT to increase their independence and safety with mobility to allow discharge to the venue listed below.   Pt declines rehab, therefore recommend HHPT at D/C, may need incr supervision initially--pt states her sister can help and her son is coming from out of town once she goes home; pt very motivated to work with PT and get stronger amb 40' x2  With RW and min assist today     Follow Up Recommendations Home health PT;Supervision for mobility/OOB    Equipment Recommendations  None recommended by PT    Recommendations for Other Services       Precautions / Restrictions Precautions Precautions: Fall      Mobility  Bed Mobility Overal bed mobility: Needs Assistance Bed Mobility: Supine to Sit;Sit to Supine     Supine to sit: Min assist Sit to supine: Min guard   General bed mobility comments: assist to elevate trunk, incr time   Transfers Overall transfer level: Needs assistance Equipment used: Rolling walker (2 wheeled) Transfers: Sit to/from Stand Sit to Stand: Min assist         General transfer comment: cues for hand placement and safety  Ambulation/Gait Ambulation/Gait assistance: Min assist Ambulation Distance (Feet): 40 Feet (x2) Assistive device: Rolling walker (2 wheeled) Gait Pattern/deviations: Trunk flexed;Step-through  pattern;Decreased stride length     General Gait Details: pt fatigues quickly, sitting rest after 40' for recovery d/t incr WOB/DOE; O2 sats 93-84% on 2-3 L O2 during amb;   Stairs            Wheelchair Mobility    Modified Rankin (Stroke Patients Only)       Balance Overall balance assessment: Needs assistance   Sitting balance-Leahy Scale: Good       Standing balance-Leahy Scale: Poor Standing balance comment: reliant on UE support                             Pertinent Vitals/Pain Pain Assessment: No/denies pain    Home Living Family/patient expects to be discharged to:: Private residence Living Arrangements: Alone Available Help at Discharge: Family;Available PRN/intermittently (sister, son coming in from [place] at D/C) Type of Home: House Home Access: Stairs to enter Entrance Stairs-Rails: Can reach both Entrance Stairs-Number of Steps: 3 + 3 Home Layout: One level Home Equipment: Walker - 2 wheels;Cane - single point Additional Comments: Pt. states she did not use walker, mostly cane prior to this admission; she enjoys walking as much as possible when feeling well    Prior Function Level of Independence: Independent         Comments: drives     Hand Dominance        Extremity/Trunk Assessment   Upper Extremity Assessment Upper Extremity Assessment: Defer to OT evaluation    Lower Extremity Assessment Lower Extremity Assessment: Generalized weakness    Cervical /  Trunk Assessment Cervical / Trunk Assessment: Other exceptions Cervical / Trunk Exceptions: diminished curvature of spine, flat back, elevated shoulders  Communication   Communication: No difficulties  Cognition Arousal/Alertness: Awake/alert Behavior During Therapy: WFL for tasks assessed/performed Overall Cognitive Status: Within Functional Limits for tasks assessed                                        General Comments General comments (skin  integrity, edema, etc.): encouraged incr trunk extension, proper breathing techniques    Exercises     Assessment/Plan    PT Assessment Patient needs continued PT services  PT Problem List Decreased strength;Decreased activity tolerance;Decreased balance;Decreased mobility;Cardiopulmonary status limiting activity       PT Treatment Interventions DME instruction;Gait training;Functional mobility training;Therapeutic activities;Therapeutic exercise;Patient/family education    PT Goals (Current goals can be found in the Care Plan section)  Acute Rehab PT Goals Patient Stated Goal: home, I do not want to go to rehab PT Goal Formulation: With patient Time For Goal Achievement: 12/15/16 Potential to Achieve Goals: Good    Frequency Min 3X/week   Barriers to discharge        Co-evaluation               AM-PAC PT "6 Clicks" Daily Activity  Outcome Measure Difficulty turning over in bed (including adjusting bedclothes, sheets and blankets)?: A Little Difficulty moving from lying on back to sitting on the side of the bed? : A Little Difficulty sitting down on and standing up from a chair with arms (e.g., wheelchair, bedside commode, etc,.)?: A Little Help needed moving to and from a bed to chair (including a wheelchair)?: A Little Help needed walking in hospital room?: A Little Help needed climbing 3-5 steps with a railing? : A Lot 6 Click Score: 17    End of Session Equipment Utilized During Treatment: Gait belt;Oxygen Activity Tolerance: Patient limited by fatigue Patient left: in bed;with call bell/phone within reach   PT Visit Diagnosis: Difficulty in walking, not elsewhere classified (R26.2)    Time: 7124-5809 PT Time Calculation (min) (ACUTE ONLY): 23 min   Charges:   PT Evaluation $PT Eval Low Complexity: 1 Procedure PT Treatments $Gait Training: 8-22 mins   PT G CodesKenyon Ana, PT Pager: [phone] 12/08/2016    Kenyon Ana 12/08/2016,  3:22 PM

## 2016-12-09 ENCOUNTER — Inpatient Hospital Stay (HOSPITAL_COMMUNITY): Payer: Medicare Other

## 2016-12-09 MED ORDER — KETOROLAC TROMETHAMINE 30 MG/ML IJ SOLN
30.0000 mg | Freq: Three times a day (TID) | INTRAMUSCULAR | Status: AC | PRN
  Administered 2016-12-09 – 2016-12-11 (×5): 30 mg via INTRAVENOUS
  Filled 2016-12-09 (×5): qty 1

## 2016-12-09 MED ORDER — IPRATROPIUM-ALBUTEROL 0.5-2.5 (3) MG/3ML IN SOLN
3.0000 mL | Freq: Four times a day (QID) | RESPIRATORY_TRACT | Status: DC
  Administered 2016-12-10: 3 mL via RESPIRATORY_TRACT
  Filled 2016-12-09: qty 3

## 2016-12-09 NOTE — Progress Notes (Signed)
Spoke with pt concerning HH. Pt had no preference, referral given to [employer]

## 2016-12-09 NOTE — Progress Notes (Signed)
PROGRESS NOTE    Michele Martin  UKG:[id number] DOB: 04/16/1946 DOA: 12/05/2016 PCP: Biagio Borg, MD    Brief Narrative:  71 y.o. woman with a history of COPD, OSA (she is not on CPAP at baseline but she is on 2L [place] qHS at home), asthma, osteoporosis, HLD, glaucoma, and anxiety/depression who presents to the ED via EMS from her PCP's office for evaluation of shortness of breath, nausea/vomiting, and fever.  The patient feels that she was in her baseline state of health until last night.  She developed nausea and vomiting and has not been able to tolerate any significant PO intake today.  She has also had subjective fever with sweats, but she denies chills.  She has had a productive cough, increased shortness of breath, and increased wheezing.  No known sick contacts.  No lower urinary tract symptoms.  No significant abdominal pain.    She received one duoneb in her PCP's office prior to transfer to the ED.  ED Course: The patient had a rectal temp of 103 with mild tachycardia and relative hypotension (systolic BP 76'E).  O2 sats 90% on RA, 95% with 2L [place].  Normal WBC count.  BUN 26 otherwise unremarkable CMP.  Troponin 0.01.  Lactic acid level 2.74.  Chest xray shows LUL and RLL infiltrates.  U/A shows large leukocytes, TNTC WBC, and many bacteria.  She has received 1L NS bolus, Solumedrol '125mg'$  IV x one, one continuous albuterol neb and one duoneb.  Blood cultures have been drawn.  She has received Rocephin and azithromycin.  Hospitalist asked to admit.  During this hospital course, patient improved with IVF with lactate normalizing. Patient continued with wheezing and is continued on nebs and steroids. Continuing to wean O2  Assessment & Plan:   Principal Problem:   CAP (community acquired pneumonia) Active Problems:   Anxiety and depression   Severe sepsis with septic shock (HCC)   Acute lower UTI   Acute respiratory failure with hypoxia (HCC)   COPD with acute exacerbation  (HCC)  Severe sepsis secondary to CAP --Patient has been continued on IV Rocephin and oral azithromcyin --Blood cultures neg x 2 --Urine strep neg --Lactate peaked to over 4, since resolved following IVF hydration --Sepsis physiology has resolved - Ordered repeat CXR, reviewed. Partial clearing of LUL PNA. Radiology recommendations for repeat CXR in 3-4 weeks to ensure resolution -ordered flutter valve  Acute hypoxic respiratory failure and mild COPD exacerbation secondary to CAP --Baseline on room air with nocturnal O2 as needed --Patient continued on Solumedrol '60mg'$  IV q6h, plan to eventually wean as tolerated --Duoneb q4h while awake and q2h prn (HOLD spiriva for now) --Dulera substituted for symbicort --Incentive spirometry, respiratory therapy --Will continue Mucinex DM BID as previously prescribed --Continue Singulair --Remains O2 dependent. Continue to wean O2 as tolerated  UTI --Urine culture reviewed, demonstrating multiple species present --Patient continued on above abx  HLD --Plan to continue Atorvastatin --currently stable  Cognitive impairment --Aricept has been continued --presently stable  Neuropathic pain --Pt continued on Neurontin --presently stable  Anxiety/Depression --Will continue Lexapro --Will continue with PRN klonopin for anxiety  DVT prophylaxis: Lovenox Subq Code Status: Full Family Communication: Pt in room, family not at bedside Disposition Plan: Uncertain at this time  Consultants:     Procedures:     Antimicrobials: Anti-infectives    Start     Dose/Rate Route Frequency Ordered Stop   12/06/16 1000  azithromycin (ZITHROMAX) tablet 500 mg     500  mg Oral Daily 12/05/16 1752 12/09/16 1320   12/06/16 0000  cefTRIAXone (ROCEPHIN) 1 g in dextrose 5 % 50 mL IVPB     1 g 100 mL/hr over 30 Minutes Intravenous Every 24 hours 12/05/16 1752     12/05/16 1745  cefTRIAXone (ROCEPHIN) 1 g in dextrose 5 % 50 mL IVPB     1 g 100  mL/hr over 30 Minutes Intravenous  Once 12/05/16 1741 12/05/16 1844   12/05/16 1745  azithromycin (ZITHROMAX) 500 mg in dextrose 5 % 250 mL IVPB     500 mg 250 mL/hr over 60 Minutes Intravenous  Once 12/05/16 1741 12/05/16 2000      Subjective: Overall states feeling better. Bringing up much sputum  Objective: Vitals:   12/09/16 0551 12/09/16 0841 12/09/16 1147 12/09/16 1449  BP: 134/66   130/62  Pulse: 69 85  88  Resp: '18 20 18 16  '$ Temp: 98.1 F (36.7 C)   98.9 F (37.2 C)  TempSrc: Oral   Oral  SpO2: 98% 96% 96% 96%  Weight:      Height:        Intake/Output Summary (Last 24 hours) at 12/09/16 1459 Last data filed at 12/09/16 0912  Gross per 24 hour  Intake              290 ml  Output                0 ml  Net              290 ml   Filed Weights   12/05/16 1738 12/05/16 2159  Weight: 51.3 kg (113 lb) 50.9 kg (112 lb 3.4 oz)    Examination:  General exam: Awake, sitting in bed, in nad Respiratory system: Normal respiratory effort, coarse breath sounds, end-expiratory wheezing Cardiovascular system: regular rate, s1, s2 Gastrointestinal system: Soft, nondistended, positive BS Central nervous system: CN2-12 grossly intact, strength intact Extremities: Perfused, no clubbing Skin: Normal skin turgor, no notable skin lesions seen Psychiatry: Mood normal // no visual hallucinations    Data Reviewed: I have personally reviewed following labs and imaging studies  CBC:  Recent Labs Lab 12/05/16 1727 12/06/16 0416 12/08/16 0421  WBC 9.1 6.8 8.5  NEUTROABS 7.6  --   --   HGB 14.7 11.9* 11.9*  HCT 44.6 36.9 37.4  MCV 99.3 98.7 101.1*  PLT 171 135* 767   Basic Metabolic Panel:  Recent Labs Lab 12/05/16 1727 12/06/16 0416 12/07/16 0351 12/08/16 0421  NA 140 141 140 142  K 3.7 3.3* 4.4 4.9  CL 98* 111 113* 110  CO2 30 23 20* 24  GLUCOSE 90 113* 134* 118*  BUN 26* 21* 19 18  CREATININE 1.00 0.53 0.46 0.48  CALCIUM 9.0 7.2* 7.6* 7.9*  MG  --  1.3* 2.3   --   PHOS  --  3.2  --   --    GFR: Estimated Creatinine Clearance: 48 mL/min (by C-G formula based on SCr of 0.48 mg/dL). Liver Function Tests:  Recent Labs Lab 12/05/16 1727  AST 34  ALT 26  ALKPHOS 50  BILITOT 1.1  PROT 6.6  ALBUMIN 3.6    Recent Labs Lab 12/05/16 1727  LIPASE 13   No results for input(s): AMMONIA in the last 168 hours. Coagulation Profile: No results for input(s): INR, PROTIME in the last 168 hours. Cardiac Enzymes: No results for input(s): CKTOTAL, CKMB, CKMBINDEX, TROPONINI in the last 168 hours. BNP (last 3 results) No  results for input(s): PROBNP in the last 8760 hours. HbA1C: No results for input(s): HGBA1C in the last 72 hours. CBG: No results for input(s): GLUCAP in the last 168 hours. Lipid Profile: No results for input(s): CHOL, HDL, LDLCALC, TRIG, CHOLHDL, LDLDIRECT in the last 72 hours. Thyroid Function Tests: No results for input(s): TSH, T4TOTAL, FREET4, T3FREE, THYROIDAB in the last 72 hours. Anemia Panel: No results for input(s): VITAMINB12, FOLATE, FERRITIN, TIBC, IRON, RETICCTPCT in the last 72 hours. Sepsis Labs:  Recent Labs Lab 12/05/16 2256  12/06/16 1423 12/06/16 1704 12/07/16 0942 12/07/16 1259  PROCALCITON 8.04  --   --   --   --   --   LATICACIDVEN  --   < > 2.9* 2.9* 2.6* 1.3  < > = values in this interval not displayed.  Recent Results (from the past 240 hour(s))  Blood Culture (routine x 2)     Status: None (Preliminary result)   Collection Time: 12/05/16  5:27 PM  Result Value Ref Range Status   Specimen Description RIGHT ANTECUBITAL  Final   Special Requests   Final    BOTTLES DRAWN AEROBIC AND ANAEROBIC Blood Culture adequate volume   Culture   Final    NO GROWTH 4 DAYS Performed at [facility], [address], [place], [place] [address]    Report Status PENDING  Incomplete  Blood Culture (routine x 2)     Status: None (Preliminary result)   Collection Time: 12/05/16  5:27 PM  Result Value  Ref Range Status   Specimen Description LEFT ANTECUBITAL  Final   Special Requests   Final    BOTTLES DRAWN AEROBIC AND ANAEROBIC Blood Culture adequate volume   Culture   Final    NO GROWTH 4 DAYS Performed at [facility], [place] [address], [place], [place] [address]    Report Status PENDING  Incomplete  Urine culture     Status: Abnormal   Collection Time: 12/05/16  5:27 PM  Result Value Ref Range Status   Specimen Description URINE, RANDOM  Final   Special Requests NONE  Final   Culture MULTIPLE SPECIES PRESENT, SUGGEST RECOLLECTION (A)  Final   Report Status 12/07/2016 FINAL  Final  MRSA PCR Screening     Status: None   Collection Time: 12/06/16  3:14 AM  Result Value Ref Range Status   MRSA by PCR NEGATIVE NEGATIVE Final    Comment:        The GeneXpert MRSA Assay (FDA approved for NASAL specimens only), is one component of a comprehensive MRSA colonization surveillance program. It is not intended to diagnose MRSA infection nor to guide or monitor treatment for MRSA infections.      Radiology Studies: Dg Chest Port 1 View  Result Date: 12/09/2016 CLINICAL DATA:  Follow-up shortness of breath EXAM: PORTABLE CHEST 1 VIEW COMPARISON:  12/05/2016 FINDINGS: Partial clearing of airspace disease on the left. Background of COPD with hyperinflation and emphysematous changes. Borderline heart size. Stable negative mediastinal contours. IMPRESSION: 1. Partial clearing of left upper lobe pneumonia. Followup PA and lateral chest X-ray is recommended in 3-4 weeks following to ensure resolution. 2. COPD. Electronically Signed   By: Monte Fantasia M.D.   On: 12/09/2016 09:48    Scheduled Meds: . aspirin EC  81 mg Oral QHS  . atorvastatin  20 mg Oral QHS  . dextromethorphan-guaiFENesin  2 tablet Oral BID  . donepezil  10 mg Oral QHS  . enoxaparin (LOVENOX) injection  40 mg Subcutaneous Q24H  . escitalopram  10 mg Oral Daily  . feeding supplement (ENSURE ENLIVE)  237 mL Oral  BID BM  . gabapentin  100 mg Oral TID  . ipratropium-albuterol  3 mL Nebulization Q4H WA  . methylPREDNISolone (SOLU-MEDROL) injection  60 mg Intravenous Q6H  . mometasone-formoterol  2 puff Inhalation BID  . montelukast  10 mg Oral QHS   Continuous Infusions: . cefTRIAXone (ROCEPHIN)  IV Stopped (12/09/16 0048)     LOS: 4 days   Leialoha Hanna, Orpah Melter, MD [facility] Pager [phone]  If 7PM-7AM, please contact night-coverage [email or web address] Password [facility] 12/09/2016, 2:59 PM

## 2016-12-09 NOTE — Consult Note (Signed)
   [facility] CM Inpatient Consult   12/09/2016  Michele Martin 02/06/1946 [id number]   [facility] Care Management follow up. Went to bedside to speak with patient. However, she was sleeping and sister did not Surveyor, mining to disturb. Patient is active with [place] Management RN [facility]. Will request for patient to be assigned to [place]. Will follow up at bedside another time per sister request.    Marthenia Rolling, MSN-Ed, RN,BSN [facility] Liaison [phone]

## 2016-12-09 NOTE — Progress Notes (Signed)
OT Cancellation Note  Patient Details Name: Michele Martin MRN: [id number] DOB: 09/17/1945   Cancelled Treatment:    Reason Eval/Treat Not Completed: Fatigue/lethargy limiting ability to participate  Pt sleeping soundly.  OT woke pt and pt stated she wanted to go back to sleep. Will check back on  Pt as schedule allows.  Kari Baars, [place]  Payton Mccallum D 12/09/2016, 12:56 PM

## 2016-12-10 ENCOUNTER — Inpatient Hospital Stay (HOSPITAL_COMMUNITY): Payer: Medicare Other

## 2016-12-10 ENCOUNTER — Encounter: Payer: Self-pay | Admitting: *Deleted

## 2016-12-10 DIAGNOSIS — F419 Anxiety disorder, unspecified: Secondary | ICD-10-CM

## 2016-12-10 DIAGNOSIS — R079 Chest pain, unspecified: Secondary | ICD-10-CM

## 2016-12-10 DIAGNOSIS — F329 Major depressive disorder, single episode, unspecified: Secondary | ICD-10-CM

## 2016-12-10 DIAGNOSIS — B370 Candidal stomatitis: Secondary | ICD-10-CM

## 2016-12-10 LAB — CULTURE, BLOOD (ROUTINE X 2)
CULTURE: NO GROWTH
CULTURE: NO GROWTH
SPECIAL REQUESTS: ADEQUATE
Special Requests: ADEQUATE

## 2016-12-10 LAB — TROPONIN I: Troponin I: <0.03 ng/mL (ref <0.03)

## 2016-12-10 MED ORDER — BUDESONIDE 0.5 MG/2ML IN SUSP
0.5000 mg | Freq: Two times a day (BID) | RESPIRATORY_TRACT | Status: DC
  Administered 2016-12-10 – 2016-12-12 (×4): 0.5 mg via RESPIRATORY_TRACT
  Filled 2016-12-10 (×4): qty 2

## 2016-12-10 MED ORDER — FUROSEMIDE 10 MG/ML IJ SOLN
20.0000 mg | Freq: Every day | INTRAMUSCULAR | Status: DC
  Administered 2016-12-11: 20 mg via INTRAVENOUS
  Filled 2016-12-10: qty 2

## 2016-12-10 MED ORDER — ALBUTEROL SULFATE (2.5 MG/3ML) 0.083% IN NEBU
2.5000 mg | INHALATION_SOLUTION | RESPIRATORY_TRACT | Status: DC
  Administered 2016-12-10 – 2016-12-12 (×11): 2.5 mg via RESPIRATORY_TRACT
  Filled 2016-12-10 (×11): qty 3

## 2016-12-10 MED ORDER — FUROSEMIDE 10 MG/ML IJ SOLN: 20.0000 mg | Freq: Every day | INTRAMUSCULAR | Status: DC

## 2016-12-10 MED ORDER — PREDNISONE 50 MG PO TABS
60.0000 mg | ORAL_TABLET | Freq: Every day | ORAL | Status: DC
  Administered 2016-12-10 – 2016-12-12 (×3): 60 mg via ORAL
  Filled 2016-12-10 (×3): qty 1

## 2016-12-10 MED ORDER — FUROSEMIDE 10 MG/ML IJ SOLN
40.0000 mg | Freq: Once | INTRAMUSCULAR | Status: AC
  Administered 2016-12-10: 40 mg via INTRAVENOUS
  Filled 2016-12-10: qty 4

## 2016-12-10 MED ORDER — ARFORMOTEROL TARTRATE 15 MCG/2ML IN NEBU
15.0000 ug | INHALATION_SOLUTION | Freq: Two times a day (BID) | RESPIRATORY_TRACT | Status: DC
  Administered 2016-12-10 – 2016-12-12 (×4): 15 ug via RESPIRATORY_TRACT
  Filled 2016-12-10 (×4): qty 2

## 2016-12-10 MED ORDER — ALBUTEROL SULFATE (2.5 MG/3ML) 0.083% IN NEBU: 2.5000 mg | INHALATION_SOLUTION | RESPIRATORY_TRACT | Status: DC | PRN

## 2016-12-10 MED ORDER — FLUCONAZOLE 100 MG PO TABS
200.0000 mg | ORAL_TABLET | Freq: Every day | ORAL | Status: DC
  Administered 2016-12-10 – 2016-12-12 (×3): 200 mg via ORAL
  Filled 2016-12-10 (×3): qty 2

## 2016-12-10 MED ORDER — TIOTROPIUM BROMIDE MONOHYDRATE 18 MCG IN CAPS
18.0000 ug | ORAL_CAPSULE | Freq: Every day | RESPIRATORY_TRACT | Status: DC
  Administered 2016-12-11 – 2016-12-12 (×2): 18 ug via RESPIRATORY_TRACT
  Filled 2016-12-10: qty 5

## 2016-12-10 MED ORDER — TRAMADOL HCL 50 MG PO TABS
50.0000 mg | ORAL_TABLET | Freq: Four times a day (QID) | ORAL | Status: DC | PRN
  Administered 2016-12-10 – 2016-12-12 (×3): 50 mg via ORAL
  Filled 2016-12-10 (×3): qty 1

## 2016-12-10 NOTE — Progress Notes (Addendum)
PROGRESS NOTE [facility]   Michele Martin   XBM:[id number] DOB: [birth year]  DOA: 12/05/2016 PCP: Biagio Borg, MD   Brief Narrative:  71 year old female with history of COPD on 2 L nasal cannula daily at bedtime as home, OSA, asthma, osteoporosis presents to the emergency department from her PCP office for evaluation of shortness of breath, nausea vomiting, and fever. Patient was admitted with pneumonia causing sepsis and was placed aggressive IV fluid, IV antibiotics, IV steroids and nebulizer.  Subjective: Patient seen and examined this morning with nurse at bedside. Patient is short of breath and is struggling with breathing. She went to the bathroom and her saturation dropped to the 80s. She feels tight and with sternal chest pain. Pain has been going on since last night and is constant with no radiation. Patient denies palpitation and dizziness. Patient also coughing frequently. Afebrile  Assessment & Plan: Sepsis secondary to community-acquired pneumonia. Sepsis physiology has resolved Patient was treated with IV Rocephin and azithromycin. Blood cultures thus far negative 2 Urine strep negative Repeat x-ray on 5/15 shows improvement of left upper lobe pneumonia and recommendations for repeat x-ray in 3-4 weeks was made. Although repeated x-ray was done today given respiratory distress shows pulmonary edema.  Acute hypoxic respiratory failure, this is multifactorial now with pulmonary edema and initially with COPD exacerbation due to CAP Patient started on IV Lasix Solu-Medrol transitioned to prednisone DuoNeb solution for only albuterol and resume Spiriva Discontinued Dulera Will add Pulmicort and Brovana Continue Singulair Wean O2 as tolerated - may need to be discharged on oxygen while acute inflammatory process results Continue supportive treatment Flutter valve  Chest pain - multifactorial, unlikely to be cardiac in origin EKG ordered, normal sinus  rhythm Will cycle troponin Continue to monitor, pain medication when necessary  Oral Trush Likely secondary from inhalers Diflucan  UTI Urine culture review multiple species likely contaminant Patient on antibiotic for CAP  Dementia Patient on Aricept  Anxiety/depression Continue current regimen  DVT prophylaxis: (Lovenox Code Status: Full code Family Communication: Daughter and granddaughter at bedside Disposition Plan: Home when medically stable hopefully in the next 24-48 hours  Consultants:   None  Procedures:   None  Antimicrobials: Anti-infectives    Start     Dose/Rate Route Frequency Ordered Stop   12/10/16 1100  fluconazole (DIFLUCAN) tablet 200 mg     200 mg Oral Daily 12/10/16 1017 12/17/16 0959   12/06/16 1000  azithromycin (ZITHROMAX) tablet 500 mg     500 mg Oral Daily 12/05/16 1752 12/09/16 1320   12/06/16 0000  cefTRIAXone (ROCEPHIN) 1 g in dextrose 5 % 50 mL IVPB     1 g 100 mL/hr over 30 Minutes Intravenous Every 24 hours 12/05/16 1752     12/05/16 1745  cefTRIAXone (ROCEPHIN) 1 g in dextrose 5 % 50 mL IVPB     1 g 100 mL/hr over 30 Minutes Intravenous  Once 12/05/16 1741 12/05/16 1844   12/05/16 1745  azithromycin (ZITHROMAX) 500 mg in dextrose 5 % 250 mL IVPB     500 mg 250 mL/hr over 60 Minutes Intravenous  Once 12/05/16 1741 12/05/16 2000        Objective: Vitals:   12/09/16 1947 12/09/16 2156 12/10/16 0543 12/10/16 0846  BP:  (!) 144/64 (!) 154/86   Pulse:  66 67 81  Resp:  '18 16 20  '$ Temp:  98.5 F (36.9 C) 98.7 F (37.1 C)   TempSrc:  Oral Oral   SpO2: 92%  96% 93% 91%  Weight:      Height:        Intake/Output Summary (Last 24 hours) at 12/10/16 1013 Last data filed at 12/10/16 0102  Gross per 24 hour  Intake               60 ml  Output                0 ml  Net               60 ml   Filed Weights   12/05/16 1738 12/05/16 2159  Weight: 51.3 kg (113 lb) 50.9 kg (112 lb 3.4 oz)    Examination:  General exam: Mild  anxious, slight distress HEENT: AC/AT, PERRLA, OP moist, Tongue white plaque that scrape off with tongue depressor Respiratory system: Decrease breath sound, diffuse rhonchi with mild crackles at the bases mild expiratory wheezing diffusely Cardiovascular system: S1 & S2 heard, RRR, murmurs, rubs or gallops Gastrointestinal system: Abdomen is nondistended, soft and nontender. No organomegaly or masses felt. Normal bowel sounds heard. Central nervous system: Alert and oriented. No focal neurological deficits. Extremities: Bilateral lower extremity pitting edema 1+ Skin: No rashes, lesions or ulcers Psychiatry: Mood & affect appropriate.    Data Reviewed: I have personally reviewed following labs and imaging studies  CBC:  Recent Labs Lab 12/05/16 1727 12/06/16 0416 12/08/16 0421  WBC 9.1 6.8 8.5  NEUTROABS 7.6  --   --   HGB 14.7 11.9* 11.9*  HCT 44.6 36.9 37.4  MCV 99.3 98.7 101.1*  PLT 171 135* 829   Basic Metabolic Panel:  Recent Labs Lab 12/05/16 1727 12/06/16 0416 12/07/16 0351 12/08/16 0421  NA 140 141 140 142  K 3.7 3.3* 4.4 4.9  CL 98* 111 113* 110  CO2 30 23 20* 24  GLUCOSE 90 113* 134* 118*  BUN 26* 21* 19 18  CREATININE 1.00 0.53 0.46 0.48  CALCIUM 9.0 7.2* 7.6* 7.9*  MG  --  1.3* 2.3  --   PHOS  --  3.2  --   --    GFR: Estimated Creatinine Clearance: 48 mL/min (by C-G formula based on SCr of 0.48 mg/dL). Liver Function Tests:  Recent Labs Lab 12/05/16 1727  AST 34  ALT 26  ALKPHOS 50  BILITOT 1.1  PROT 6.6  ALBUMIN 3.6    Recent Labs Lab 12/05/16 1727  LIPASE 13   No results for input(s): AMMONIA in the last 168 hours. Coagulation Profile: No results for input(s): INR, PROTIME in the last 168 hours. Cardiac Enzymes: No results for input(s): CKTOTAL, CKMB, CKMBINDEX, TROPONINI in the last 168 hours. BNP (last 3 results) No results for input(s): PROBNP in the last 8760 hours. HbA1C: No results for input(s): HGBA1C in the last 72  hours. CBG: No results for input(s): GLUCAP in the last 168 hours. Lipid Profile: No results for input(s): CHOL, HDL, LDLCALC, TRIG, CHOLHDL, LDLDIRECT in the last 72 hours. Thyroid Function Tests: No results for input(s): TSH, T4TOTAL, FREET4, T3FREE, THYROIDAB in the last 72 hours. Anemia Panel: No results for input(s): VITAMINB12, FOLATE, FERRITIN, TIBC, IRON, RETICCTPCT in the last 72 hours. Sepsis Labs:  Recent Labs Lab 12/05/16 2256  12/06/16 1423 12/06/16 1704 12/07/16 0942 12/07/16 1259  PROCALCITON 8.04  --   --   --   --   --   LATICACIDVEN  --   < > 2.9* 2.9* 2.6* 1.3  < > = values in this interval not displayed.  Recent Results (from the past 240 hour(s))  Blood Culture (routine x 2)     Status: None (Preliminary result)   Collection Time: 12/05/16  5:27 PM  Result Value Ref Range Status   Specimen Description RIGHT ANTECUBITAL  Final   Special Requests   Final    BOTTLES DRAWN AEROBIC AND ANAEROBIC Blood Culture adequate volume   Culture   Final    NO GROWTH 4 DAYS Performed at [facility], [address] [place], [place] [address]    Report Status PENDING  Incomplete  Blood Culture (routine x 2)     Status: None (Preliminary result)   Collection Time: 12/05/16  5:27 PM  Result Value Ref Range Status   Specimen Description LEFT ANTECUBITAL  Final   Special Requests   Final    BOTTLES DRAWN AEROBIC AND ANAEROBIC Blood Culture adequate volume   Culture   Final    NO GROWTH 4 DAYS Performed at [facility], [place] [address], [place], [place] [address]    Report Status PENDING  Incomplete  Urine culture     Status: Abnormal   Collection Time: 12/05/16  5:27 PM  Result Value Ref Range Status   Specimen Description URINE, RANDOM  Final   Special Requests NONE  Final   Culture MULTIPLE SPECIES PRESENT, SUGGEST RECOLLECTION (A)  Final   Report Status 12/07/2016 FINAL  Final  MRSA PCR Screening     Status: None   Collection Time: 12/06/16  3:14 AM   Result Value Ref Range Status   MRSA by PCR NEGATIVE NEGATIVE Final    Comment:        The GeneXpert MRSA Assay (FDA approved for NASAL specimens only), is one component of a comprehensive MRSA colonization surveillance program. It is not intended to diagnose MRSA infection nor to guide or monitor treatment for MRSA infections.          Radiology Studies: Dg Chest Port 1 View  Result Date: 12/09/2016 CLINICAL DATA:  Follow-up shortness of breath EXAM: PORTABLE CHEST 1 VIEW COMPARISON:  12/05/2016 FINDINGS: Partial clearing of airspace disease on the left. Background of COPD with hyperinflation and emphysematous changes. Borderline heart size. Stable negative mediastinal contours. IMPRESSION: 1. Partial clearing of left upper lobe pneumonia. Followup PA and lateral chest X-ray is recommended in 3-4 weeks following to ensure resolution. 2. COPD. Electronically Signed   By: Monte Fantasia M.D.   On: 12/09/2016 09:48      Scheduled Meds: . albuterol  2.5 mg Nebulization Q4H  . arformoterol  15 mcg Nebulization BID  . aspirin EC  81 mg Oral QHS  . atorvastatin  20 mg Oral QHS  . budesonide (PULMICORT) nebulizer solution  0.5 mg Nebulization BID  . dextromethorphan-guaiFENesin  2 tablet Oral BID  . donepezil  10 mg Oral QHS  . enoxaparin (LOVENOX) injection  40 mg Subcutaneous Q24H  . escitalopram  10 mg Oral Daily  . feeding supplement (ENSURE ENLIVE)  237 mL Oral BID BM  . gabapentin  100 mg Oral TID  . montelukast  10 mg Oral QHS  . predniSONE  60 mg Oral Q breakfast  . tiotropium  18 mcg Inhalation Daily   Continuous Infusions: . cefTRIAXone (ROCEPHIN)  IV Stopped (12/10/16 0132)     LOS: 5 days   Critical time spent: 45 minutes in coordinating care for this patient   Chipper Oman, MD Pager: Text Page via [email or web address]  [phone]  If 7PM-7AM, please contact  night-coverage [email or web address] Password [email or web address] 12/10/2016, 10:13 AM

## 2016-12-10 NOTE — Progress Notes (Signed)
OT Cancellation Note  Patient Details Name: Michele Martin MRN: [id number] DOB: [birth year]   Cancelled Treatment:    Reason Eval/Treat Not Completed: Medical issues which prohibited therapy   cancel per RN reports pt increased WOB and chest pains.  Cardiac workup  [place], Mickel Baas, [place] [phone] 12/10/2016, 12:44 PM

## 2016-12-10 NOTE — Progress Notes (Signed)
Patient was c/o chest pain and prn Toradol was given. CP per patient started last night. Patient stated she needed to use the BR and had BRP.  Afterwards, patient was very SOB walking back to the bed (while wearing oxygen) and having a hard time catching her breath.  Patient had inspiratory/expiratory wheezing and had just received a breathing tx right before ambulating.  Her oxygen saturation was 80% on 1 L/M [place] while ambulating. Increased oxygen to 2 L and after sitting for a few minutes her sats improved to 95%.  Patient was given her prn klonopin.  Patient and her sister both stated that she was worse today than yesterday.  MD paged.  New orders received.  Will continue to monitor.

## 2016-12-10 NOTE — Progress Notes (Signed)
PT Cancellation Note  Patient Details Name: Michele Martin MRN: [id number] DOB: 12-04-1945   Cancelled Treatment:     cancel per RN reports pt increased WOB and chest pains.  Cardiac workup.  Will check back later today or tomorrow.  Pt has been evaluated with plans to D/C back home with [facility] as pt declines Rehab.   Rica Koyanagi  PTA [facility]  Acute  Rehab Pager      [phone]

## 2016-12-11 DIAGNOSIS — R0902 Hypoxemia: Secondary | ICD-10-CM

## 2016-12-11 DIAGNOSIS — I5033 Acute on chronic diastolic (congestive) heart failure: Secondary | ICD-10-CM

## 2016-12-11 LAB — BASIC METABOLIC PANEL
Anion gap: 7 (ref 5–15)
BUN: 35 mg/dL — AB (ref 6–20)
CALCIUM: 8.7 mg/dL — AB (ref 8.9–10.3)
CO2: 39 mmol/L — AB (ref 22–32)
CREATININE: 0.4 mg/dL — AB (ref 0.44–1.00)
Chloride: 97 mmol/L — ABNORMAL LOW (ref 101–111)
GFR calc Af Amer: >60 mL/min (ref >60)
GFR calc non Af Amer: >60 mL/min (ref >60)
GLUCOSE: 84 mg/dL (ref 65–99)
Potassium: 3.8 mmol/L (ref 3.5–5.1)
Sodium: 143 mmol/L (ref 135–145)

## 2016-12-11 LAB — CBC
HEMATOCRIT: 40.2 % (ref 36.0–46.0)
Hemoglobin: 12.6 g/dL (ref 12.0–15.0)
MCH: 32 pg (ref 26.0–34.0)
MCHC: 31.3 g/dL (ref 30.0–36.0)
MCV: 102 fL — AB (ref 78.0–100.0)
Platelets: 181 10*3/uL (ref 150–400)
RBC: 3.94 MIL/uL (ref 3.87–5.11)
RDW: 13 % (ref 11.5–15.5)
WBC: 9.1 10*3/uL (ref 4.0–10.5)

## 2016-12-11 MED ORDER — FUROSEMIDE 10 MG/ML IJ SOLN
20.0000 mg | Freq: Two times a day (BID) | INTRAMUSCULAR | Status: DC
  Administered 2016-12-11 – 2016-12-12 (×2): 20 mg via INTRAVENOUS
  Filled 2016-12-11 (×2): qty 2

## 2016-12-11 NOTE — Consult Note (Signed)
   [facility] Inpatient Consult   12/11/2016  Haizlee Henton Karen 1946/06/03 [id number]      [facility] Care Management follow up. Previously active with [place]. Went to bedside to speak with patient and grandaughter Advertising account planner) about ongoing [place] Management services. Patient is agreeable to [place] referral. Discussed ongoing outreach from [facility] Pharmacist. Mrs. Geurin states she was able to receive assistance for her Spiriva. However, she would like continued follow up from [place] for medication review. States " I am on a lot of meds".  Mrs. Clinkenbeard lives alone. Her sister lives nearby. Written consent obtained and [facility] Care Management packet provided. Confirmed Primary Care MD as Dr. Jenny Reichmann. Confirmed best contact number as [phone].   Mrs. Pflaum states she plans on going home with home health. Explained to her that [place] Management will not interfere or replace services provided by home health.   Will make referral for [place] follow up upon discharge. Will touch base with [facility] PharmD. Will make inpatient RNCM aware that [facility] following post discharge.   Marthenia Rolling, MSN-Ed, RN,BSN [facility] Liaison [phone]

## 2016-12-11 NOTE — Evaluation (Signed)
Occupational Therapy Evaluation Patient Details Name: Michele Martin MRN: [id number] DOB: 1946/04/07 Today's Date: 12/11/2016    History of Present Illness 71 y.o. woman with a history of COPD, OSA (she is not on CPAP at baseline but she is on 2L [place] qHS at home), asthma, osteoporosis, HLD, glaucoma, and anxiety/depression who presents to the ED via EMS from her PCP's office for evaluation of shortness of breath, nausea/vomiting, and fever-->dx:CAP   Clinical Impression   Pt admitted with shortness of breath. Pt currently with functional limitations due to the deficits listed below (see OT Problem List). Pt will benefit from skilled OT to increase their safety and independence with ADL and functional mobility for ADL to facilitate discharge to venue listed below.      Follow Up Recommendations  Home health OT;Supervision/Assistance - 24 hour    Equipment Recommendations  3 in 1 bedside commode       Precautions / Restrictions Precautions Precautions: Fall Restrictions Weight Bearing Restrictions: No      Mobility Bed Mobility Overal bed mobility: Needs Assistance Bed Mobility: Supine to Sit     Supine to sit: Min assist        Transfers Overall transfer level: Needs assistance Equipment used: Rolling walker (2 wheeled) Transfers: Sit to/from [facility] Sit to Stand: Min assist Stand pivot transfers: Min assist       General transfer comment: cues for hand placement and safety    Balance Overall balance assessment: Needs assistance   Sitting balance-Leahy Scale: Good       Standing balance-Leahy Scale: Poor Standing balance comment: reliant on UE support                           ADL either performed or assessed with clinical judgement   ADL Overall ADL's : Needs assistance/impaired Eating/Feeding: Set up;Sitting   Grooming: Set up;Sitting   Upper Body Bathing: Minimal assistance;Sitting   Lower Body Bathing: Maximal  assistance;Sit to/from stand;Cueing for safety;Cueing for sequencing   Upper Body Dressing : Minimal assistance;Sitting   Lower Body Dressing: Maximal assistance;Sit to/from stand;Cueing for safety;Cueing for sequencing   Toilet Transfer: Minimal assistance;RW;BSC;Cueing for sequencing;Cueing for safety;Stand-pivot   Toileting- Clothing Manipulation and Hygiene: Minimal assistance;Sit to/from stand         General ADL Comments: pt has family that can A sometimes but not 24/7. Pt is calling son to see if he can be of A when she leaves hopsital.     Vision Patient Visual Report: No change from baseline              Pertinent Vitals/Pain Pain Assessment: Faces Faces Pain Scale: Hurts a little bit Pain Location: chest area- RN aware Pain Descriptors / Indicators: Discomfort;Sore Pain Intervention(s): Monitored during session;Repositioned;Limited activity within patient's tolerance     Hand Dominance     Extremity/Trunk Assessment Upper Extremity Assessment Upper Extremity Assessment: Generalized weakness           Communication Communication Communication: No difficulties   Cognition Arousal/Alertness: Awake/alert Behavior During Therapy: WFL for tasks assessed/performed Overall Cognitive Status: Within Functional Limits for tasks assessed                                                Home Living Family/patient expects to be discharged to:: Private residence Living  Arrangements: Alone Available Help at Discharge: Family;Available PRN/intermittently (sister, son coming in from [place] at D/C) Type of Home: House Home Access: Stairs to enter [employer] of Steps: 3 + 3 Entrance Stairs-Rails: Can reach both Home Layout: One level     Bathroom Shower/Tub: Teacher, early years/pre: Standard     Home Equipment: Environmental consultant - 2 wheels;Cane - single point   Additional Comments: Pt. states she did not use walker, mostly cane prior to  this admission; she enjoys walking as much as possible when feeling well      Prior Functioning/Environment Level of Independence: Independent        Comments: drives        OT Problem List: Decreased strength;Decreased activity tolerance;Impaired balance (sitting and/or standing);Decreased knowledge of use of DME or AE;Decreased safety awareness;Cardiopulmonary status limiting activity;Decreased knowledge of precautions         OT Goals(Current goals can be found in the care plan section) Acute Rehab OT Goals Patient Stated Goal: home, I do not want to go to rehab OT Goal Formulation: With patient Time For Goal Achievement: 12/25/16 Potential to Achieve Goals: Good  OT Frequency: Min 2X/week   Barriers to D/C:               AM-PAC PT "6 Clicks" Daily Activity     Outcome Measure Help from another person eating meals?: None Help from another person taking care of personal grooming?: A Little Help from another person toileting, which includes using toliet, bedpan, or urinal?: A Little Help from another person bathing (including washing, rinsing, drying)?: A Lot Help from another person to put on and taking off regular upper body clothing?: A Little Help from another person to put on and taking off regular lower body clothing?: A Lot 6 Click Score: 17   End of Session Equipment Utilized During Treatment: Rolling walker Nurse Communication: Mobility status  Activity Tolerance: Patient limited by fatigue Patient left: in chair;with call bell/phone within reach;with family/visitor present  OT Visit Diagnosis: Unsteadiness on feet (R26.81);Muscle weakness (generalized) (M62.81)                Time: 8768-1157 OT Time Calculation (min): 19 min Charges:  OT General Charges $OT Visit: 1 Procedure OT Evaluation $OT Eval Moderate Complexity: 1 Procedure G-Codes:     Kari Baars, OT [phone]  Payton Mccallum D 12/11/2016, 11:08 AM

## 2016-12-11 NOTE — Progress Notes (Signed)
Noted 02 sats qualify for home 02 more often-TC [facility] spoke to [place] of d/c please fax to [phone] sats, d/c summary,home 02 order, they will deliver travel tank to hospital.

## 2016-12-11 NOTE — Progress Notes (Signed)
Physical Therapy Treatment Patient Details Name: Michele Martin MRN: [id number] DOB: 07-May-1946 Today's Date: 12/11/2016    History of Present Illness 71 y.o. woman with a history of COPD, OSA (she is not on CPAP at baseline but she is on 2L [place] qHS at home), asthma, osteoporosis, HLD, glaucoma, and anxiety/depression who presents to the ED via EMS from her PCP's office for evaluation of shortness of breath, nausea/vomiting, and fever-->dx:CAP    PT Comments    Pt in bed on 3 lts at 100%.  Removed O2 (trial) at rest sats 90%.  Assisted OOB to [facility] to void (100cc) then amb in hallway twice.  First on RA second on 3 lts.  SATURATION QUALIFICATIONS: (This note is used to comply with regulatory documentation for home oxygen)   Patient Saturation on 3 lts at rest 100%  Patient Saturations on Room Air at Rest = 90%  Patient Saturations on Room Air while Ambulating 15 feet = 74%  Patient Saturations on 3 Liters of oxygen while Ambulating = 91%  Please briefly explain why patient needs home oxygen:  Pt required supplemental oxygen to achieve therapeutic level for activity  Assisted back to room and back to bed per pt request.  Pt plans to D/C back home with family help.      Follow Up Recommendations  Home health PT;Supervision for mobility/OOB     Equipment Recommendations  None recommended by PT    Recommendations for Other Services       Precautions / Restrictions Precautions Precautions: Fall Restrictions Weight Bearing Restrictions: No    Mobility  Bed Mobility Overal bed mobility: Needs Assistance Bed Mobility: Supine to Sit     Supine to sit: Min guard;Supervision Sit to supine: Min guard;Supervision   General bed mobility comments: increased time, HOB elevated and use of rail  Transfers Overall transfer level: Needs assistance Equipment used: None Transfers: Sit to/from [facility] Sit to Stand: Supervision;Min guard Stand pivot  transfers: Supervision;Min guard       General transfer comment: good use of hands tio steady self pivot 1/4 turn from bed to [facility]  Ambulation/Gait Ambulation/Gait assistance: Supervision;Min guard Ambulation Distance (Feet): 30 Feet (15 feet x 2 one sitting rest break) Assistive device: Rolling walker (2 wheeled) Gait Pattern/deviations: Trunk flexed;Step-through pattern;Decreased stride length Gait velocity: decreased   General Gait Details: limited activity tolerance.  Amb on RA (trial) sats decreased from 90% to 74% with increased WOB.  Reapplied 3 lts to achieve 95% with amb 15 feet.  50% VC's on purse lip breathing and relation tech.  Encouraged to cough   Stairs            Wheelchair Mobility    Modified Rankin (Stroke Patients Only)       Balance                                            Cognition Arousal/Alertness: Awake/alert Behavior During Therapy: WFL for tasks assessed/performed Overall Cognitive Status: Within Functional Limits for tasks assessed                                        Exercises      General Comments        Pertinent Vitals/Pain Pain Assessment: No/denies pain Pain  Location: chest from "coughing" stated pt Pain Intervention(s): Monitored during session    Home Living                      Prior Function            PT Goals (current goals can now be found in the care plan section) Progress towards PT goals: Progressing toward goals    Frequency    Min 3X/week      PT Plan Current plan remains appropriate    Co-evaluation              AM-PAC PT "6 Clicks" Daily Activity  Outcome Measure  Difficulty turning over in bed (including adjusting bedclothes, sheets and blankets)?: A Little Difficulty moving from lying on back to sitting on the side of the bed? : A Little Difficulty sitting down on and standing up from a chair with arms (e.g., wheelchair, bedside commode,  etc,.)?: A Little Help needed moving to and from a bed to chair (including a wheelchair)?: A Little Help needed walking in hospital room?: A Little Help needed climbing 3-5 steps with a railing? : A Lot 6 Click Score: 17    End of Session Equipment Utilized During Treatment: Gait belt;Oxygen Activity Tolerance: Patient limited by fatigue Patient left: in bed;with call bell/phone within reach Nurse Communication: Mobility status (O2 sats recorded in .pulm/EPIC) PT Visit Diagnosis: Difficulty in walking, not elsewhere classified (R26.2)     Time: 0459-9774 PT Time Calculation (min) (ACUTE ONLY): 31 min  Charges:  $Gait Training: 8-22 mins $Therapeutic Activity: 8-22 mins                    G Codes:       Rica Koyanagi  PTA [facility]  Acute  Rehab Pager      [phone]

## 2016-12-11 NOTE — Care Management Important Message (Signed)
Important Message  Patient Details  Name: Michele Martin MRN: [id number] Date of Birth: 05-29-1946   Medicare Important Message Given:  Yes    Kerin Salen 12/11/2016, 10:58 AMImportant Message  Patient Details  Name: Michele Martin MRN: [id number] Date of Birth: August 30, 1945   Medicare Important Message Given:  Yes    Kerin Salen 12/11/2016, 10:58 AM

## 2016-12-11 NOTE — Progress Notes (Signed)
PROGRESS NOTE [facility]   Michele Martin   VFI:[id number] DOB: 1946-05-26  DOA: 12/05/2016 PCP: Biagio Borg, MD   Brief Narrative:  71 year old female with history of COPD on 2 L nasal cannula daily at bedtime as home, OSA, asthma, osteoporosis presents to the emergency department from her PCP office for evaluation of shortness of breath, nausea vomiting, and fever. Patient was admitted with pneumonia causing sepsis and was placed aggressive IV fluid, IV antibiotics, IV steroids and nebulizer. Patient subsequently developed pulmonary edema, for which was given IV diuresis.   Subjective: Patient seen and examined this AM. She is doing significantly better. Having good diuresis. Breathing has improved. Leg swelling decreasing as well.   Assessment & Plan: Sepsis secondary to community-acquired pneumonia. Sepsis physiology has resolved Patient was treated with IV Rocephin and azithromycin. Will complete a 7 day treatment of rocephin. Patient completed Azithro course  Blood cultures thus far negative 2 Urine strep negative Repeat x-ray on 5/15 shows improvement of left upper lobe pneumonia and recommendations for repeat x-ray in 3-4 weeks was made. Although repeated x-ray was done today given respiratory distress shows pulmonary edema.  Acute hypoxic respiratory failure, this is multifactorial now with pulmonary edema and initially with COPD exacerbation due to CAP Patient started on IV Lasix - will continue for now BID  Place TED hose  Solu-Medrol transitioned to prednisone will taper for 14 day DuoNeb solution for only albuterol and resume Spiriva Discontinued Dulera Continue Pulmicort and Brovana Continue Singulair Patient will need O2 supplement upon discharge, patient desats with ambulation  Continue supportive treatment Flutter valve  Acute on chronic diastolic HF  ECHO on 41/6606 show G1DD with preserve EF  See above    Atypical Chest pain - Resolved  multifactorial COPD and PNA, unlikely to be cardiac in origin EKG normal, TNI normal  Continue to monitor, can d/c telemetry   Oral Trush Likely secondary from inhalers Diflucan x 7 days   UTI Urine culture review multiple species likely contaminant Patient on antibiotic for CAP  Dementia Patient on Aricept  Anxiety/depression Continue current regimen  DVT prophylaxis: Lovenox Code Status: Full code Family Communication: Granddaughter at bedside Disposition Plan: Home when medically stable hopefully in the next 24-48 hours  Consultants:   None  Procedures:   None  Antimicrobials: Anti-infectives    Start     Dose/Rate Route Frequency Ordered Stop   12/10/16 1100  fluconazole (DIFLUCAN) tablet 200 mg     200 mg Oral Daily 12/10/16 1017 12/17/16 0959   12/06/16 1000  azithromycin (ZITHROMAX) tablet 500 mg     500 mg Oral Daily 12/05/16 1752 12/09/16 1320   12/06/16 0000  cefTRIAXone (ROCEPHIN) 1 g in dextrose 5 % 50 mL IVPB     1 g 100 mL/hr over 30 Minutes Intravenous Every 24 hours 12/05/16 1752 12/12/16 2359   12/05/16 1745  cefTRIAXone (ROCEPHIN) 1 g in dextrose 5 % 50 mL IVPB     1 g 100 mL/hr over 30 Minutes Intravenous  Once 12/05/16 1741 12/05/16 1844   12/05/16 1745  azithromycin (ZITHROMAX) 500 mg in dextrose 5 % 250 mL IVPB     500 mg 250 mL/hr over 60 Minutes Intravenous  Once 12/05/16 1741 12/05/16 2000       Objective: Vitals:   12/11/16 0014 12/11/16 0513 12/11/16 0558 12/11/16 1701  BP:  101/68  (!) 165/67  Pulse:  65  (!) 59  Resp:  18  13  Temp:  97.6  F (36.4 C)  98 F (36.7 C)  TempSrc:  Oral  Oral  SpO2: 97% 99% 98% 99%  Weight:      Height:        Intake/Output Summary (Last 24 hours) at 12/11/16 1714 Last data filed at 12/11/16 0526  Gross per 24 hour  Intake              100 ml  Output              400 ml  Net             -300 ml   Filed Weights   12/05/16 1738 12/05/16 2159  Weight: 51.3 kg (113 lb) 50.9 kg (112 lb 3.4  oz)    Examination:  General exam: NAD  HEENT: Tongue with white plaque, OP moist  Respiratory system: Breath sounds diminished, mild force expiratory wheezing, b/l rales and crackles predominant on the bases  Cardiovascular system: S1S2 RRR, no JVD noted  Gastrointestinal system: Abd Soft NTND  Central nervous system: AAOx 3 Extremities: LE edema b/l 1+ Skin: Multiple ecchymosis in upper extremities   Data Reviewed: I have personally reviewed following labs and imaging studies  CBC:  Recent Labs Lab 12/05/16 1727 12/06/16 0416 12/08/16 0421 12/11/16 0527  WBC 9.1 6.8 8.5 9.1  NEUTROABS 7.6  --   --   --   HGB 14.7 11.9* 11.9* 12.6  HCT 44.6 36.9 37.4 40.2  MCV 99.3 98.7 101.1* 102.0*  PLT 171 135* 188 476   Basic Metabolic Panel:  Recent Labs Lab 12/05/16 1727 12/06/16 0416 12/07/16 0351 12/08/16 0421 12/11/16 0527  NA 140 141 140 142 143  K 3.7 3.3* 4.4 4.9 3.8  CL 98* 111 113* 110 97*  CO2 30 23 20* 24 39*  GLUCOSE 90 113* 134* 118* 84  BUN 26* 21* 19 18 35*  CREATININE 1.00 0.53 0.46 0.48 0.40*  CALCIUM 9.0 7.2* 7.6* 7.9* 8.7*  MG  --  1.3* 2.3  --   --   PHOS  --  3.2  --   --   --    GFR: Estimated Creatinine Clearance: 48 mL/min (A) (by C-G formula based on SCr of 0.4 mg/dL (L)). Liver Function Tests:  Recent Labs Lab 12/05/16 1727  AST 34  ALT 26  ALKPHOS 50  BILITOT 1.1  PROT 6.6  ALBUMIN 3.6    Recent Labs Lab 12/05/16 1727  LIPASE 13   No results for input(s): AMMONIA in the last 168 hours. Coagulation Profile: No results for input(s): INR, PROTIME in the last 168 hours. Cardiac Enzymes:  Recent Labs Lab 12/10/16 1040 12/10/16 1635 12/10/16 2201  TROPONINI <0.03 <0.03 <0.03   BNP (last 3 results) No results for input(s): PROBNP in the last 8760 hours. HbA1C: No results for input(s): HGBA1C in the last 72 hours. CBG: No results for input(s): GLUCAP in the last 168 hours. Lipid Profile: No results for input(s): CHOL,  HDL, LDLCALC, TRIG, CHOLHDL, LDLDIRECT in the last 72 hours. Thyroid Function Tests: No results for input(s): TSH, T4TOTAL, FREET4, T3FREE, THYROIDAB in the last 72 hours. Anemia Panel: No results for input(s): VITAMINB12, FOLATE, FERRITIN, TIBC, IRON, RETICCTPCT in the last 72 hours. Sepsis Labs:  Recent Labs Lab 12/05/16 2256  12/06/16 1423 12/06/16 1704 12/07/16 0942 12/07/16 1259  PROCALCITON 8.04  --   --   --   --   --   LATICACIDVEN  --   < > 2.9* 2.9* 2.6* 1.3  < > =  values in this interval not displayed.  Recent Results (from the past 240 hour(s))  Blood Culture (routine x 2)     Status: None   Collection Time: 12/05/16  5:27 PM  Result Value Ref Range Status   Specimen Description RIGHT ANTECUBITAL  Final   Special Requests   Final    BOTTLES DRAWN AEROBIC AND ANAEROBIC Blood Culture adequate volume   Culture   Final    NO GROWTH 5 DAYS Performed at [facility], [address] [place], [place] [address]    Report Status 12/10/2016 FINAL  Final  Blood Culture (routine x 2)     Status: None   Collection Time: 12/05/16  5:27 PM  Result Value Ref Range Status   Specimen Description LEFT ANTECUBITAL  Final   Special Requests   Final    BOTTLES DRAWN AEROBIC AND ANAEROBIC Blood Culture adequate volume   Culture   Final    NO GROWTH 5 DAYS Performed at [facility], [address] [place], [place] [address]    Report Status 12/10/2016 FINAL  Final  Urine culture     Status: Abnormal   Collection Time: 12/05/16  5:27 PM  Result Value Ref Range Status   Specimen Description URINE, RANDOM  Final   Special Requests NONE  Final   Culture MULTIPLE SPECIES PRESENT, SUGGEST RECOLLECTION (A)  Final   Report Status 12/07/2016 FINAL  Final  MRSA PCR Screening     Status: None   Collection Time: 12/06/16  3:14 AM  Result Value Ref Range Status   MRSA by PCR NEGATIVE NEGATIVE Final    Comment:        The GeneXpert MRSA Assay (FDA approved for NASAL  specimens only), is one component of a comprehensive MRSA colonization surveillance program. It is not intended to diagnose MRSA infection nor to guide or monitor treatment for MRSA infections.          Radiology Studies: Dg Chest Port 1 View  Result Date: 12/10/2016 CLINICAL DATA:  Shortness of breath. EXAM: PORTABLE CHEST 1 VIEW COMPARISON:  Radiograph of Dec 09, 2016. FINDINGS: Stable cardiomediastinal silhouette. Atherosclerosis of thoracic aorta is noted. No pneumothorax is noted. Increased bilateral diffuse interstitial densities are noted throughout both lungs most consistent with edema. Left midlung subsegmental atelectasis is noted. Minimal bilateral pleural effusions may be present. Bony thorax is unremarkable. IMPRESSION: Aortic atherosclerosis. Increased bilateral diffuse interstitial densities are noted most consistent with pulmonary edema. Left midlung subsegmental atelectasis is noted as well. Electronically Signed   By: Marijo Conception, M.D.   On: 12/10/2016 10:26      Scheduled Meds: . albuterol  2.5 mg Nebulization Q4H  . arformoterol  15 mcg Nebulization BID  . aspirin EC  81 mg Oral QHS  . atorvastatin  20 mg Oral QHS  . budesonide (PULMICORT) nebulizer solution  0.5 mg Nebulization BID  . dextromethorphan-guaiFENesin  2 tablet Oral BID  . donepezil  10 mg Oral QHS  . enoxaparin (LOVENOX) injection  40 mg Subcutaneous Q24H  . escitalopram  10 mg Oral Daily  . feeding supplement (ENSURE ENLIVE)  237 mL Oral BID BM  . fluconazole  200 mg Oral Daily  . furosemide  20 mg Intravenous Daily  . gabapentin  100 mg Oral TID  . montelukast  10 mg Oral QHS  . predniSONE  60 mg Oral Q breakfast  . tiotropium  18 mcg Inhalation Daily   Continuous Infusions: . cefTRIAXone (  ROCEPHIN)  IV Stopped (12/11/16 0017)     LOS: 6 days   Critical time spent: 45 minutes in coordinating care for this patient   Chipper Oman, MD Pager: Text Page via [email or web address]   [phone]  If 7PM-7AM, please contact night-coverage [email or web address] Password [email or web address] 12/11/2016, 5:14 PM

## 2016-12-11 NOTE — Care Management Note (Signed)
Case Management Note  Patient Details  Name: ORPAH HAUSNER MRN: [id number] Date of Birth: [birth year]  Subjective/Objective:   Noted 02 sats qualify for home 02-[facility] already provides home 02 but only HS-will send 02 sats,& await home 02 order if 02 needed more often so travel tank can be brought to hospital @ d/c. [place] following for [facility] ordered,awaiting d/c order.Patient declines 3n1 since she has rw she feels she can walk to her own bathroom.               Action/Plan:d/c home w/HHC/dme   Expected Discharge Date:                  Expected Discharge Plan:  [facility]  In-House Referral:     Discharge planning Services  CM Consult  Post Acute Care Choice:   ([facility]-home 02 HS,rw) Choice offered to:  Patient  DME Arranged:    DME Agency:     HH Arranged:  RN, PT, OT HH Agency:  [place]  Status of Service:  In process, will continue to follow  If discussed at Long Length of Stay Meetings, dates discussed:    Additional Comments:  Dessa Phi, RN 12/11/2016, 3:16 PM

## 2016-12-11 NOTE — Progress Notes (Signed)
PHYSICAL THERAPY  SATURATION QUALIFICATIONS: (This note is used to comply with regulatory documentation for home oxygen)   Patient Saturation on 3 lts at rest 100%  Patient Saturations on Room Air at Rest = 90%  Patient Saturations on Room Air while Ambulating 15 feet = 74%  Patient Saturations on 3 Liters of oxygen while Ambulating = 91%  Please briefly explain why patient needs home oxygen:  Pt required supplemental oxygen to achieve therapeutic level for activity  Rica Koyanagi  PTA [facility]  Acute  Rehab Pager      [phone]

## 2016-12-11 NOTE — Progress Notes (Signed)
Nutrition Follow-up  INTERVENTION:   Continue Ensure Enlive po BID, each supplement provides 350 kcal and 20 grams of protein RD to continue to monitor  NUTRITION DIAGNOSIS:   Increased nutrient needs related to other (see comment) (COPD) as evidenced by estimated needs.  Ongoing.  GOAL:   Patient will meet greater than or equal to 90% of their needs  Progressing.   MONITOR:   PO intake, Supplement acceptance, Labs, Weight trends, I & O's  ASSESSMENT:   71 y.o. woman with a history of COPD, OSA (she is not on CPAP at baseline but she is on 2L [place] qHS at home), asthma, osteoporosis, HLD, glaucoma, and anxiety/depression who presents to the ED via EMS from her PCP's office for evaluation of shortness of breath, nausea/vomiting, and fever.  The patient feels that she was in her baseline state of health until last night.  She developed nausea and vomiting and has not been able to tolerate any significant PO intake today.  She has also had subjective fever with sweats, but she denies chills.  She has had a productive cough, increased shortness of breath, and increased wheezing.  No known sick contacts.  No lower urinary tract symptoms.  No significant abdominal pain.    Last PO intake recorded: 100% 5/13. Pt with increased SOB today. No new weights recorded. Pt is accepting ensure supplements, will continue order.  Labs reviewed. Medications: IV Lasix daily   Diet Order:  Diet Heart Room service appropriate? Yes; Fluid consistency: Thin  Skin:  Reviewed, no issues  Last BM:  5/12  Height:   Ht Readings from Last 1 Encounters:  12/05/16 '5\' 1"'$  (1.549 m)    Weight:   Wt Readings from Last 1 Encounters:  12/05/16 112 lb 3.4 oz (50.9 kg)    Ideal Body Weight:  47.7 kg  BMI:  Body mass index is 21.2 kg/m.  Estimated Nutritional Needs:   Kcal:  1500-1700  Protein:  70-80g  Fluid:  1.7L/day  EDUCATION NEEDS:   No education needs identified at this time  Clayton Bibles, MS, RD, LDN Pager: [phone] After Hours Pager: [phone]

## 2016-12-12 ENCOUNTER — Ambulatory Visit: Payer: Self-pay | Admitting: Pharmacist

## 2016-12-12 DIAGNOSIS — R0789 Other chest pain: Secondary | ICD-10-CM

## 2016-12-12 LAB — BASIC METABOLIC PANEL
Anion gap: 12 (ref 5–15)
BUN: 29 mg/dL — AB (ref 6–20)
CHLORIDE: 92 mmol/L — AB (ref 101–111)
CO2: 37 mmol/L — AB (ref 22–32)
CREATININE: 0.36 mg/dL — AB (ref 0.44–1.00)
Calcium: 8.8 mg/dL — ABNORMAL LOW (ref 8.9–10.3)
GFR calc Af Amer: >60 mL/min (ref >60)
GFR calc non Af Amer: >60 mL/min (ref >60)
GLUCOSE: 118 mg/dL — AB (ref 65–99)
POTASSIUM: 3.7 mmol/L (ref 3.5–5.1)
SODIUM: 141 mmol/L (ref 135–145)

## 2016-12-12 MED ORDER — FUROSEMIDE 40 MG PO TABS: 20.0000 mg | ORAL_TABLET | Freq: Every day | ORAL | 0 refills | Status: DC

## 2016-12-12 MED ORDER — ALBUTEROL SULFATE (2.5 MG/3ML) 0.083% IN NEBU
2.5000 mg | INHALATION_SOLUTION | Freq: Three times a day (TID) | RESPIRATORY_TRACT | Status: DC
  Administered 2016-12-12: 2.5 mg via RESPIRATORY_TRACT
  Filled 2016-12-12: qty 3

## 2016-12-12 MED ORDER — FLUCONAZOLE 200 MG PO TABS: 200.0000 mg | ORAL_TABLET | Freq: Every day | ORAL | 0 refills | Status: DC

## 2016-12-12 MED ORDER — BUDESONIDE 0.5 MG/2ML IN SUSP: 0.5000 mg | Freq: Two times a day (BID) | RESPIRATORY_TRACT | 0 refills | Status: DC

## 2016-12-12 MED ORDER — DONEPEZIL HCL 10 MG PO TABS: 10.0000 mg | ORAL_TABLET | Freq: Every day | ORAL | Status: DC

## 2016-12-12 MED ORDER — DEXTROSE 5 % IV SOLN
1.0000 g | Freq: Once | INTRAVENOUS | Status: AC
  Administered 2016-12-12: 1 g via INTRAVENOUS
  Filled 2016-12-12: qty 10

## 2016-12-12 MED ORDER — PREDNISONE 10 MG PO TABS: ORAL_TABLET | ORAL | 0 refills | Status: DC

## 2016-12-12 NOTE — Progress Notes (Signed)
This CM faxed home 02 orders, to [place]. Travel 02 tank to be delivered to pt room.  Marney Doctor RN,BSN,NCM [phone]

## 2016-12-12 NOTE — Progress Notes (Signed)
Went over discharge paperwork with patient.  All questions answered.  Prescriptions and discharge paperwork give to patient.  PT wheeled out by NT with portable o2 tank.  HH needs set up by case management.

## 2016-12-12 NOTE — Progress Notes (Signed)
Physical Therapy Treatment Patient Details Name: ANESA FRONEK MRN: [id number] DOB: [birth year] Today's Date: 12/12/2016    History of Present Illness 71 y.o. woman with a history of COPD, OSA (she is not on CPAP at baseline but she is on 2L [place] qHS at home), asthma, osteoporosis, HLD, glaucoma, and anxiety/depression who presents to the ED via EMS from her PCP's office for evaluation of shortness of breath, nausea/vomiting, and fever-->dx:CAP    PT Comments    Assisted OOB to [facility] to void then amb a greater distance in hallway using walker and required 2 lts to achieve 90%.  SATURATION QUALIFICATIONS: (This note is used to comply with regulatory documentation for home oxygen)  Patient Saturations on Room Air at Rest = 90%  Patient Saturations on Room Air while Ambulating = 82%  Patient Saturations on  2   Liters of oxygen while Ambulating = 90%  Please briefly explain why patient needs home oxygen:  Pt required supplemental oxygen to achieve therapeutic levels  Follow Up Recommendations  Home health PT;Supervision for mobility/OOB     Equipment Recommendations  None recommended by PT    Recommendations for Other Services       Precautions / Restrictions Precautions Precautions: Fall Precaution Comments: monitor O2 Restrictions Weight Bearing Restrictions: No    Mobility  Bed Mobility Overal bed mobility: Modified Independent Bed Mobility: Supine to Sit;Sit to Supine           General bed mobility comments: increased time and use of rail  Transfers Overall transfer level: Needs assistance Equipment used: None Transfers: Sit to/from [employer] Transfers Sit to Stand: Supervision Stand pivot transfers: Supervision       General transfer comment: good use of hands tio steady self pivot 1/4 turn from bed to [facility]  Ambulation/Gait Ambulation/Gait assistance: Supervision;Min guard Ambulation Distance (Feet): 110 Feet (55 feet x 2 one sitting rest  break) Assistive device: Rolling walker (2 wheeled) Gait Pattern/deviations: Trunk flexed;Step-through pattern;Decreased stride length Gait velocity: decreased   General Gait Details: tolerated an increased distance however required 2 lts O2 to achieve 90%.     Stairs            Wheelchair Mobility    Modified Rankin (Stroke Patients Only)       Balance                                            Cognition Arousal/Alertness: Awake/alert Behavior During Therapy: WFL for tasks assessed/performed Overall Cognitive Status: Within Functional Limits for tasks assessed                                        Exercises      General Comments        Pertinent Vitals/Pain Pain Assessment: No/denies pain    Home Living                      Prior Function            PT Goals (current goals can now be found in the care plan section) Progress towards PT goals: Progressing toward goals    Frequency    Min 3X/week      PT Plan Current plan remains appropriate    Co-evaluation  AM-PAC PT "6 Clicks" Daily Activity  Outcome Measure  Difficulty turning over in bed (including adjusting bedclothes, sheets and blankets)?: A Little Difficulty moving from lying on back to sitting on the side of the bed? : A Little Difficulty sitting down on and standing up from a chair with arms (e.g., wheelchair, bedside commode, etc,.)?: A Little Help needed moving to and from a bed to chair (including a wheelchair)?: A Little Help needed walking in hospital room?: A Little Help needed climbing 3-5 steps with a railing? : A Lot 6 Click Score: 17    End of Session Equipment Utilized During Treatment: Gait belt;Oxygen Activity Tolerance: Patient limited by fatigue Patient left: in bed;with call bell/phone within reach Nurse Communication: Mobility status PT Visit Diagnosis: Difficulty in walking, not elsewhere classified  (R26.2)     Time: 1610-9604 PT Time Calculation (min) (ACUTE ONLY): 18 min  Charges:  $Gait Training: 8-22 mins                    G Codes:       Rica Koyanagi  PTA [facility]  Acute  Rehab Pager      [phone]

## 2016-12-12 NOTE — Discharge Summary (Signed)
Physician Discharge Summary  NIKHITA MENTZEL  ELF:[id number]  DOB: 1946-04-08  DOA: 12/05/2016 PCP: Biagio Borg, MD  Admit date: 12/05/2016 Discharge date: 12/12/2016  Admitted From: Home Disposition:  Home   Recommendations for Outpatient Follow-up:  1. Follow up with PCP in 1-2 weeks 2. Please obtain BMP/CBC in one week to monitor Cr and Hgb 3. Please repeat CXR in 3-4 weeks to assure resolution of xray findings  4. Follow up with pulmonology in 2-3 weeks   Home Health: PT/RN  Equipment/Devices: O2 [place] 2L continuous   Discharge Condition: Improved  CODE STATUS: FULL  Diet recommendation: Heart Healthy  Brief/Interim Summary: 71 year old female with history of COPD on 2 L nasal cannula daily at bedtime as home, OSA, asthma, osteoporosis presents to the emergency department from her PCP office for evaluation of shortness of breath, nausea vomiting, and fever. Patient was admitted with pneumonia causing sepsis and was placed aggressive IV fluid, IV antibiotics, IV steroids and nebulizer. Patient subsequently developed pulmonary edema, for which was given IV diuresis. Patient developed chest pain for which cardiac work up was negative.   Subjective: Patient seen and examined this AM. Patient report breathing back to baseline, she want to go home. Patient has diuresed well, swelling has decrease significantly. Patient saturation remains stable on O2 2L [place]. Patient remains afebrile. Denies chest pain, palpitations and dizziness.   Discharge Diagnoses/Hospital Course:  Sepsis secondary to community-acquired pneumonia. Sepsis physiology has resolved Patient was treated with IV Rocephin x 7 days and azithromycin x 3 days  Blood cultures negative 2 Urine strep negative Repeat x-ray on 5/15 shows improvement of left upper lobe pneumonia and recommendations for repeat x-ray in 3-4 weeks was made. Another Xray was done on 12/10/16 due to resp distress which showed pulmonary edema    Acute hypoxic respiratory failure, this is multifactorial due to pulmonary edema and initially with COPD exacerbation due to CAP Patient was treated with IV lasix which is transitioned to oral lasix 20 mg daily at home  She was treated with TED hose which improved her leg edema  Solu-Medrol transitioned to prednisone will discharge on a taper for 14 day Initially treated with DuoNeb - then transitioned to Spiriva and Albuterol PRN  Will discharge patient on Symbicort and will send prescription for Pulmicort for use when having more difficulty with inhalation Continue Singulair Need O2 supplementation to be continuous  Continue use of Flutter valve  Acute on chronic diastolic HF  ECHO on 58/5277 show G1DD with preserve EF  Treated with IV Lasix - d/c on PO lasix 20 mg daily   Atypical Chest pain - Resolved multifactorial COPD and PNA, unlikely to be cardiac in origin EKG normal, TNI normal x 3  Follow up with PCP to see if further work up deemed to be necessary   Oral Trush Likely secondary from inhalers Diflucan x 7 days   UTI Urine culture review multiple species likely contaminant Patient completed course of Abx with Ceftriaxone   Dementia No changes in medications were made   Anxiety/depression No changes in medication were made   All other chronic medical condition were stable during the hospitalization.  Patient was seen by physical therapy, recommending home health PT  On the day of the discharge the patient's vitals were stable, and no other acute medical condition were reported by patient. Patient was felt safe to be discharge to home  Discharge Instructions  You were cared for by a hospitalist during your hospital stay. If  you have any questions about your discharge medications or the care you received while you were in the hospital after you are discharged, you can call the unit and asked to speak with the hospitalist on call if the hospitalist that took  care of you is not available. Once you are discharged, your primary care physician will handle any further medical issues. Please note that NO REFILLS for any discharge medications will be authorized once you are discharged, as it is imperative that you return to your primary care physician (or establish a relationship with a primary care physician if you do not have one) for your aftercare needs so that they can reassess your need for medications and monitor your lab values.  Discharge Instructions    (HEART FAILURE PATIENTS) Call MD:  Anytime you have any of the following symptoms: 1) 3 pound weight gain in 24 hours or 5 pounds in 1 week 2) shortness of breath, with or without a dry hacking cough 3) swelling in the hands, feet or stomach 4) if you have to sleep on extra pillows at night in order to breathe.    Complete by:  As directed    AMB Referral to [facility]    Complete by:  As directed    Please assign to [facility] for transition of care for COPD. Previously active with [facility]. Active with [facility] Pharmacist. Written consent obtained. Currently at [facility]. Please call with questions. Marthenia Rolling, [place], RN,BSN-[facility] Liaison-[phone]   Reason for consult:  Please assign to [facility] RNCM   Diagnoses of:  COPD/ Pneumonia   Expected date of contact:  1-3 days (reserved for hospital discharges)   Call MD for:  difficulty breathing, headache or visual disturbances    Complete by:  As directed    Call MD for:  extreme fatigue    Complete by:  As directed    Call MD for:  hives    Complete by:  As directed    Call MD for:  persistant dizziness or light-headedness    Complete by:  As directed    Call MD for:  persistant nausea and vomiting    Complete by:  As directed    Call MD for:  redness, tenderness, or signs of infection (pain, swelling, redness, odor or green/yellow discharge around incision site)    Complete by:  As  directed    Call MD for:  severe uncontrolled pain    Complete by:  As directed    Call MD for:  temperature >100.4    Complete by:  As directed    Diet - low sodium heart healthy    Complete by:  As directed    Increase activity slowly    Complete by:  As directed      Allergies as of 12/12/2016      Reactions   Pravastatin Itching, Other (See Comments)   Reaction:  Muscle cramps    Simvastatin Itching, Other (See Comments)   Reaction:  Muscle cramps    Statins Itching, Other (See Comments)   Reaction:  Muscle cramps      Medication List    STOP taking these medications   diphenhydrAMINE 25 mg capsule Commonly known as:  BENADRYL     TAKE these medications   albuterol 108 (90 Base) MCG/ACT inhaler Commonly known as:  PROVENTIL HFA Inhale 2 puffs into the lungs every 6 (six) hours as needed for wheezing or  shortness of breath.   aspirin EC 81 MG tablet Take 81 mg by mouth at bedtime.   atorvastatin 20 MG tablet Commonly known as:  LIPITOR TAKE 1 TABLET BY MOUTH ONCE DAILY What changed:  See the new instructions.   b complex vitamins tablet Take 1 tablet by mouth at bedtime.   budesonide 0.5 MG/2ML nebulizer solution Commonly known as:  PULMICORT Take 2 mLs (0.5 mg total) by nebulization 2 (two) times daily. If unable to use Symbicort   budesonide-formoterol 160-4.5 MCG/ACT inhaler Commonly known as:  SYMBICORT Inhale 2 puffs into the lungs 2 (two) times daily.   clonazePAM 0.5 MG tablet Commonly known as:  KLONOPIN Take 1 tablet (0.5 mg total) by mouth 2 (two) times daily as needed for anxiety.   dextromethorphan-guaiFENesin 30-600 MG 12hr tablet Commonly known as:  MUCINEX DM Take 2 tablets by mouth 2 (two) times daily.   donepezil 10 MG tablet Commonly known as:  ARICEPT Take 1 tablet (10 mg total) by mouth at bedtime.   escitalopram 20 MG tablet Commonly known as:  LEXAPRO Take 0.5 tablets (10 mg total) by mouth daily.   fluconazole 200 MG  tablet Commonly known as:  DIFLUCAN Take 1 tablet (200 mg total) by mouth daily. Start taking on:  12/13/2016   furosemide 40 MG tablet Commonly known as:  LASIX Take 0.5 tablets (20 mg total) by mouth daily.   gabapentin 100 MG capsule Commonly known as:  NEURONTIN Take 100 mg by mouth 3 (three) times daily.   hydrochlorothiazide 25 MG tablet Commonly known as:  HYDRODIURIL Take 1 tablet (25 mg total) by mouth daily.   montelukast 10 MG tablet Commonly known as:  SINGULAIR TAKE 1 TABLET BY MOUTH EVERY NIGHT AT BEDTIME   predniSONE 10 MG tablet Commonly known as:  DELTASONE Take 4 tablets for 3 days; Take 3 tablets for 4 days; Take 2 tablets for 3 days; Take 1 tablet for 4 days   tiotropium 18 MCG inhalation capsule Commonly known as:  SPIRIVA HANDIHALER Place 1 capsule (18 mcg total) into inhaler and inhale daily.            Durable Medical Equipment        Start     Ordered   12/12/16 1227  For home use only DME oxygen  Once    Question Answer Comment  Mode or (Route) Nasal cannula   Liters per Minute 2   Frequency Continuous (stationary and portable oxygen unit needed)   Oxygen delivery system Gas      12/12/16 1226     Follow-up Information    [place] [place] Follow up.   Specialty:  Home Health Services Why:  [facility] nursing,physical/occupational therapy Contact information: [place] [address] [phone]        Biagio Borg, MD. Schedule an appointment as soon as possible for a visit in 1 week(s).   Specialties:  Internal Medicine, Radiology Why:  Hospital follow up  Contact information: [address] [place] [address] [phone]          Allergies  Allergen Reactions  . Pravastatin Itching and Other (See Comments)    Reaction:  Muscle cramps   . Simvastatin Itching and Other (See Comments)    Reaction:  Muscle cramps   . Statins Itching and Other (See Comments)    Reaction:   Muscle cramps    Consultations:  None    Procedures/Studies:  Dg Chest 2 View  Result Date: 12/05/2016 CLINICAL DATA:  Shortness of breath question pneumonia, history asthma, COPD, chronic bronchitis, former smoker EXAM: CHEST  2 VIEW COMPARISON:  08/04/2016 FINDINGS: Normal heart size, mediastinal contours, and pulmonary vascularity. Atherosclerotic calcification aorta. Extensive infiltrate throughout LEFT upper lobe and probably within RIGHT lower lobe as well consistent with pneumonia. No pleural effusion or pneumothorax. Underlying emphysematous changes present. Bones demineralized with evidence of prior cervical spine surgery. IMPRESSION: COPD changes with LEFT upper lobe and RIGHT lower lobe infiltrates consistent with pneumonia. Aortic atherosclerosis. Electronically Signed   By: Lavonia Dana M.D.   On: 12/05/2016 17:25   Dg Chest Port 1 View  Result Date: 12/10/2016 CLINICAL DATA:  Shortness of breath. EXAM: PORTABLE CHEST 1 VIEW COMPARISON:  Radiograph of Dec 09, 2016. FINDINGS: Stable cardiomediastinal silhouette. Atherosclerosis of thoracic aorta is noted. No pneumothorax is noted. Increased bilateral diffuse interstitial densities are noted throughout both lungs most consistent with edema. Left midlung subsegmental atelectasis is noted. Minimal bilateral pleural effusions may be present. Bony thorax is unremarkable. IMPRESSION: Aortic atherosclerosis. Increased bilateral diffuse interstitial densities are noted most consistent with pulmonary edema. Left midlung subsegmental atelectasis is noted as well. Electronically Signed   By: Marijo Conception, M.D.   On: 12/10/2016 10:26   Dg Chest Port 1 View  Result Date: 12/09/2016 CLINICAL DATA:  Follow-up shortness of breath EXAM: PORTABLE CHEST 1 VIEW COMPARISON:  12/05/2016 FINDINGS: Partial clearing of airspace disease on the left. Background of COPD with hyperinflation and emphysematous changes. Borderline heart size. Stable negative  mediastinal contours. IMPRESSION: 1. Partial clearing of left upper lobe pneumonia. Followup PA and lateral chest X-ray is recommended in 3-4 weeks following to ensure resolution. 2. COPD. Electronically Signed   By: Monte Fantasia M.D.   On: 12/09/2016 09:48     Discharge Exam: Vitals:   12/11/16 2023 12/12/16 0652  BP: (!) 157/64 (!) 148/68  Pulse: 61 69  Resp: 16 16  Temp: 98.2 F (36.8 C) 98.2 F (36.8 C)   Vitals:   12/12/16 0652 12/12/16 0754 12/12/16 0759 12/12/16 0814  BP: (!) 148/68     Pulse: 69     Resp: 16     Temp: 98.2 F (36.8 C)     TempSrc: Oral     SpO2: 97% 98% 98%   Weight:    53.9 kg (118 lb 14.4 oz)  Height:        General: Pt is alert, awake, not in acute distress Cardiovascular: RRR, S1/S2 +, no rubs, no gallops Respiratory: Good air entry, mild force expiratory wheezing. No rales or crackles  Abdominal: Soft, NT, ND, bowel sounds + Extremities: trace edema   The results of significant diagnostics from this hospitalization (including imaging, microbiology, ancillary and laboratory) are listed below for reference.     Microbiology: Recent Results (from the past 240 hour(s))  Blood Culture (routine x 2)     Status: None   Collection Time: 12/05/16  5:27 PM  Result Value Ref Range Status   Specimen Description RIGHT ANTECUBITAL  Final   Special Requests   Final    BOTTLES DRAWN AEROBIC AND ANAEROBIC Blood Culture adequate volume   Culture   Final    NO GROWTH 5 DAYS Performed at [facility], [address], [place], [place] [address]    Report Status 12/10/2016 FINAL  Final  Blood Culture (routine x 2)     Status: None   Collection Time: 12/05/16  5:27 PM  Result Value Ref Range Status   Specimen Description LEFT ANTECUBITAL  Final   Special Requests   Final    BOTTLES DRAWN AEROBIC AND ANAEROBIC Blood Culture adequate volume   Culture   Final    NO GROWTH 5 DAYS Performed at [facility], [address] [place],  [place] [address]    Report Status 12/10/2016 FINAL  Final  Urine culture     Status: Abnormal   Collection Time: 12/05/16  5:27 PM  Result Value Ref Range Status   Specimen Description URINE, RANDOM  Final   Special Requests NONE  Final   Culture MULTIPLE SPECIES PRESENT, SUGGEST RECOLLECTION (A)  Final   Report Status 12/07/2016 FINAL  Final  MRSA PCR Screening     Status: None   Collection Time: 12/06/16  3:14 AM  Result Value Ref Range Status   MRSA by PCR NEGATIVE NEGATIVE Final    Comment:        The GeneXpert MRSA Assay (FDA approved for NASAL specimens only), is one component of a comprehensive MRSA colonization surveillance program. It is not intended to diagnose MRSA infection nor to guide or monitor treatment for MRSA infections.      Labs: BNP (last 3 results)  Recent Labs  07/24/16 1933 07/26/16 1225 08/04/16 2242  BNP 40.6 44.2 51.8   Basic Metabolic Panel:  Recent Labs Lab 12/06/16 0416 12/07/16 0351 12/08/16 0421 12/11/16 0527 12/12/16 0546  NA 141 140 142 143 141  K 3.3* 4.4 4.9 3.8 3.7  CL 111 113* 110 97* 92*  CO2 23 20* 24 39* 37*  GLUCOSE 113* 134* 118* 84 118*  BUN 21* 19 18 35* 29*  CREATININE 0.53 0.46 0.48 0.40* 0.36*  CALCIUM 7.2* 7.6* 7.9* 8.7* 8.8*  MG 1.3* 2.3  --   --   --   PHOS 3.2  --   --   --   --    Liver Function Tests: No results for input(s): AST, ALT, ALKPHOS, BILITOT, PROT, ALBUMIN in the last 168 hours. No results for input(s): LIPASE, AMYLASE in the last 168 hours. No results for input(s): AMMONIA in the last 168 hours. CBC:  Recent Labs Lab 12/06/16 0416 12/08/16 0421 12/11/16 0527  WBC 6.8 8.5 9.1  HGB 11.9* 11.9* 12.6  HCT 36.9 37.4 40.2  MCV 98.7 101.1* 102.0*  PLT 135* 188 181   Cardiac Enzymes:  Recent Labs Lab 12/10/16 1040 12/10/16 1635 12/10/16 2201  TROPONINI <0.03 <0.03 <0.03   BNP: Invalid input(s): POCBNP CBG: No results for input(s): GLUCAP in the last 168 hours. D-Dimer No results  for input(s): DDIMER in the last 72 hours. Hgb A1c No results for input(s): HGBA1C in the last 72 hours. Lipid Profile No results for input(s): CHOL, HDL, LDLCALC, TRIG, CHOLHDL, LDLDIRECT in the last 72 hours. Thyroid function studies No results for input(s): TSH, T4TOTAL, T3FREE, THYROIDAB in the last 72 hours.  Invalid input(s): FREET3 Anemia work up No results for input(s): VITAMINB12, FOLATE, FERRITIN, TIBC, IRON, RETICCTPCT in the last 72 hours. Urinalysis    Component Value Date/Time   COLORURINE AMBER (A) 12/05/2016 1727   APPEARANCEUR HAZY (A) 12/05/2016 1727   LABSPEC 1.015 12/05/2016 1727   PHURINE 5.0 12/05/2016 1727   GLUCOSEU NEGATIVE 12/05/2016 1727   GLUCOSEU NEGATIVE 08/09/2015 1504   HGBUR NEGATIVE 12/05/2016 1727   BILIRUBINUR NEGATIVE 12/05/2016 1727   KETONESUR NEGATIVE 12/05/2016 1727   PROTEINUR 100 (A) 12/05/2016 1727  UROBILINOGEN 0.2 08/09/2015 1504   NITRITE NEGATIVE 12/05/2016 1727   LEUKOCYTESUR LARGE (A) 12/05/2016 1727   Sepsis Labs Invalid input(s): PROCALCITONIN,  WBC,  LACTICIDVEN Microbiology Recent Results (from the past 240 hour(s))  Blood Culture (routine x 2)     Status: None   Collection Time: 12/05/16  5:27 PM  Result Value Ref Range Status   Specimen Description RIGHT ANTECUBITAL  Final   Special Requests   Final    BOTTLES DRAWN AEROBIC AND ANAEROBIC Blood Culture adequate volume   Culture   Final    NO GROWTH 5 DAYS Performed at [facility], [address], [place], [place] [address]    Report Status 12/10/2016 FINAL  Final  Blood Culture (routine x 2)     Status: None   Collection Time: 12/05/16  5:27 PM  Result Value Ref Range Status   Specimen Description LEFT ANTECUBITAL  Final   Special Requests   Final    BOTTLES DRAWN AEROBIC AND ANAEROBIC Blood Culture adequate volume   Culture   Final    NO GROWTH 5 DAYS Performed at [facility], [place] [address], [place], [place] [address]    Report Status  12/10/2016 FINAL  Final  Urine culture     Status: Abnormal   Collection Time: 12/05/16  5:27 PM  Result Value Ref Range Status   Specimen Description URINE, RANDOM  Final   Special Requests NONE  Final   Culture MULTIPLE SPECIES PRESENT, SUGGEST RECOLLECTION (A)  Final   Report Status 12/07/2016 FINAL  Final  MRSA PCR Screening     Status: None   Collection Time: 12/06/16  3:14 AM  Result Value Ref Range Status   MRSA by PCR NEGATIVE NEGATIVE Final    Comment:        The GeneXpert MRSA Assay (FDA approved for NASAL specimens only), is one component of a comprehensive MRSA colonization surveillance program. It is not intended to diagnose MRSA infection nor to guide or monitor treatment for MRSA infections.      Time coordinating discharge: 38 minutes  SIGNED:  Chipper Oman, MD  [facility] 12/12/2016, 6:20 PM  Pager please text page via  [email or web address] Password [email or web address]

## 2016-12-12 NOTE — Progress Notes (Signed)
Physician Discharge Summary  Michele Martin  DXA:[id number]  DOB: 19-May-1946  DOA: 12/05/2016 PCP: Biagio Borg, MD  Admit date: 12/05/2016 Discharge date: 12/12/2016  Admitted From: Home Disposition:  Home   Recommendations for Outpatient Follow-up:  1. Follow up with PCP in 1-2 weeks 2. Please obtain BMP/CBC in one week to monitor Cr and Hgb 3. Please repeat CXR in 3-4 weeks to assure resolution of xray findings  4. Follow up with pulmonology in 2-3 weeks   Home Health: PT/RN  Equipment/Devices: O2 [place] 2L continuous   Discharge Condition: Improved  CODE STATUS: FULL  Diet recommendation: Heart Healthy  Brief/Interim Summary: 71 year old female with history of COPD on 2 L nasal cannula daily at bedtime as home, OSA, asthma, osteoporosis presents to the emergency department from her PCP office for evaluation of shortness of breath, nausea vomiting, and fever. Patient was admitted with pneumonia causing sepsis and was placed aggressive IV fluid, IV antibiotics, IV steroids and nebulizer. Patient subsequently developed pulmonary edema, for which was given IV diuresis. Patient developed chest pain for which cardiac work up was negative.   Subjective: Patient seen and examined this AM. Patient report breathing back to baseline, she want to go home. Patient has diuresed well, swelling has decrease significantly. Patient saturation remains stable on O2 2L [place]. Patient remains afebrile. Denies chest pain, palpitations and dizziness.   Discharge Diagnoses/Hospital Course:  Sepsis secondary to community-acquired pneumonia. Sepsis physiology has resolved Patient was treated with IV Rocephin x 7 days and azithromycin x 3 days  Blood cultures negative 2 Urine strep negative Repeat x-ray on 5/15 shows improvement of left upper lobe pneumonia and recommendations for repeat x-ray in 3-4 weeks was made. Another Xray was done on 12/10/16 due to resp distress which showed pulmonary edema    Acute hypoxic respiratory failure, this is multifactorial due to pulmonary edema and initially with COPD exacerbation due to CAP Patient was treated with IV lasix which is transitioned to oral lasix 20 mg daily at home  She was treated with TED hose which improved her leg edema  Solu-Medrol transitioned to prednisone will discharge on a taper for 14 day Initially treated with DuoNeb - then transitioned to Spiriva and Albuterol PRN  Will discharge patient on Symbicort and will send prescription for Pulmicort for use when having more difficulty with inhalation Continue Singulair Need O2 supplementation to be continuous  Continue use of Flutter valve  Acute on chronic diastolic HF  ECHO on 20/9470 show G1DD with preserve EF  Treated with IV Lasix - d/c on PO lasix 20 mg daily   Atypical Chest pain - Resolved multifactorial COPD and PNA, unlikely to be cardiac in origin EKG normal, TNI normal x 3  Follow up with PCP to see if further work up deemed to be necessary   Oral Trush Likely secondary from inhalers Diflucan x 7 days   UTI Urine culture review multiple species likely contaminant Patient completed course of Abx with Ceftriaxone   Dementia No changes in medications were made   Anxiety/depression No changes in medication were made   All other chronic medical condition were stable during the hospitalization.  Patient was seen by physical therapy, recommending home health PT  On the day of the discharge the patient's vitals were stable, and no other acute medical condition were reported by patient. Patient was felt safe to be discharge to home  Discharge Instructions  You were cared for by a hospitalist during your hospital stay. If  you have any questions about your discharge medications or the care you received while you were in the hospital after you are discharged, you can call the unit and asked to speak with the hospitalist on call if the hospitalist that took  care of you is not available. Once you are discharged, your primary care physician will handle any further medical issues. Please note that NO REFILLS for any discharge medications will be authorized once you are discharged, as it is imperative that you return to your primary care physician (or establish a relationship with a primary care physician if you do not have one) for your aftercare needs so that they can reassess your need for medications and monitor your lab values.  Discharge Instructions    (HEART FAILURE PATIENTS) Call MD:  Anytime you have any of the following symptoms: 1) 3 pound weight gain in 24 hours or 5 pounds in 1 week 2) shortness of breath, with or without a dry hacking cough 3) swelling in the hands, feet or stomach 4) if you have to sleep on extra pillows at night in order to breathe.    Complete by:  As directed    AMB Referral to [place] [facility]    Complete by:  As directed    Please assign to [facility] for transition of care for COPD. Previously active with [facility]. Active with [facility] Pharmacist. Written consent obtained. Currently at [facility] Please call with questions. Marthenia Rolling, [place], RN,BSN-[facility] Liaison-[phone]   Reason for consult:  Please assign to [facility] RNCM   Diagnoses of:  COPD/ Pneumonia   Expected date of contact:  1-3 days (reserved for hospital discharges)   Call MD for:  difficulty breathing, headache or visual disturbances    Complete by:  As directed    Call MD for:  extreme fatigue    Complete by:  As directed    Call MD for:  hives    Complete by:  As directed    Call MD for:  persistant dizziness or light-headedness    Complete by:  As directed    Call MD for:  persistant nausea and vomiting    Complete by:  As directed    Call MD for:  redness, tenderness, or signs of infection (pain, swelling, redness, odor or green/yellow discharge around incision site)    Complete by:  As  directed    Call MD for:  severe uncontrolled pain    Complete by:  As directed    Call MD for:  temperature >100.4    Complete by:  As directed    Diet - low sodium heart healthy    Complete by:  As directed    Increase activity slowly    Complete by:  As directed      Allergies as of 12/12/2016      Reactions   Pravastatin Itching, Other (See Comments)   Reaction:  Muscle cramps    Simvastatin Itching, Other (See Comments)   Reaction:  Muscle cramps    Statins Itching, Other (See Comments)   Reaction:  Muscle cramps      Medication List    STOP taking these medications   diphenhydrAMINE 25 mg capsule Commonly known as:  BENADRYL     TAKE these medications   albuterol 108 (90 Base) MCG/ACT inhaler Commonly known as:  PROVENTIL HFA Inhale 2 puffs into the lungs every 6 (six) hours as needed for wheezing or  shortness of breath.   aspirin EC 81 MG tablet Take 81 mg by mouth at bedtime.   atorvastatin 20 MG tablet Commonly known as:  LIPITOR TAKE 1 TABLET BY MOUTH ONCE DAILY What changed:  See the new instructions.   b complex vitamins tablet Take 1 tablet by mouth at bedtime.   budesonide 0.5 MG/2ML nebulizer solution Commonly known as:  PULMICORT Take 2 mLs (0.5 mg total) by nebulization 2 (two) times daily. If unable to use Symbicort   budesonide-formoterol 160-4.5 MCG/ACT inhaler Commonly known as:  SYMBICORT Inhale 2 puffs into the lungs 2 (two) times daily.   clonazePAM 0.5 MG tablet Commonly known as:  KLONOPIN Take 1 tablet (0.5 mg total) by mouth 2 (two) times daily as needed for anxiety.   dextromethorphan-guaiFENesin 30-600 MG 12hr tablet Commonly known as:  MUCINEX DM Take 2 tablets by mouth 2 (two) times daily.   donepezil 10 MG tablet Commonly known as:  ARICEPT Take 1 tablet (10 mg total) by mouth at bedtime.   escitalopram 20 MG tablet Commonly known as:  LEXAPRO Take 0.5 tablets (10 mg total) by mouth daily.   fluconazole 200 MG  tablet Commonly known as:  DIFLUCAN Take 1 tablet (200 mg total) by mouth daily. Start taking on:  12/13/2016   furosemide 40 MG tablet Commonly known as:  LASIX Take 0.5 tablets (20 mg total) by mouth daily.   gabapentin 100 MG capsule Commonly known as:  NEURONTIN Take 100 mg by mouth 3 (three) times daily.   hydrochlorothiazide 25 MG tablet Commonly known as:  HYDRODIURIL Take 1 tablet (25 mg total) by mouth daily.   montelukast 10 MG tablet Commonly known as:  SINGULAIR TAKE 1 TABLET BY MOUTH EVERY NIGHT AT BEDTIME   predniSONE 10 MG tablet Commonly known as:  DELTASONE Take 4 tablets for 3 days; Take 3 tablets for 4 days; Take 2 tablets for 3 days; Take 1 tablet for 4 days   tiotropium 18 MCG inhalation capsule Commonly known as:  SPIRIVA HANDIHALER Place 1 capsule (18 mcg total) into inhaler and inhale daily.            Durable Medical Equipment        Start     Ordered   12/12/16 1227  For home use only DME oxygen  Once    Question Answer Comment  Mode or (Route) Nasal cannula   Liters per Minute 2   Frequency Continuous (stationary and portable oxygen unit needed)   Oxygen delivery system Gas      12/12/16 1226     Follow-up Information    [place] [place] Follow up.   Specialty:  Home Health Services Why:  [facility] nursing,physical/occupational therapy Contact information: [place] [address] [phone]        Biagio Borg, MD. Schedule an appointment as soon as possible for a visit in 1 week(s).   Specialties:  Internal Medicine, Radiology Why:  Hospital follow up  Contact information: [address]  [place] [address] [phone]          Allergies  Allergen Reactions  . Pravastatin Itching and Other (See Comments)    Reaction:  Muscle cramps   . Simvastatin Itching and Other (See Comments)    Reaction:  Muscle cramps   . Statins Itching and Other (See Comments)    Reaction:   Muscle cramps    Consultations:  None   Procedures/Studies: Dg  Chest 2 View  Result Date: 12/05/2016 CLINICAL DATA:  Shortness of breath question pneumonia, history asthma, COPD, chronic bronchitis, former smoker EXAM: CHEST  2 VIEW COMPARISON:  08/04/2016 FINDINGS: Normal heart size, mediastinal contours, and pulmonary vascularity. Atherosclerotic calcification aorta. Extensive infiltrate throughout LEFT upper lobe and probably within RIGHT lower lobe as well consistent with pneumonia. No pleural effusion or pneumothorax. Underlying emphysematous changes present. Bones demineralized with evidence of prior cervical spine surgery. IMPRESSION: COPD changes with LEFT upper lobe and RIGHT lower lobe infiltrates consistent with pneumonia. Aortic atherosclerosis. Electronically Signed   By: Lavonia Dana M.D.   On: 12/05/2016 17:25   Dg Chest Port 1 View  Result Date: 12/10/2016 CLINICAL DATA:  Shortness of breath. EXAM: PORTABLE CHEST 1 VIEW COMPARISON:  Radiograph of Dec 09, 2016. FINDINGS: Stable cardiomediastinal silhouette. Atherosclerosis of thoracic aorta is noted. No pneumothorax is noted. Increased bilateral diffuse interstitial densities are noted throughout both lungs most consistent with edema. Left midlung subsegmental atelectasis is noted. Minimal bilateral pleural effusions may be present. Bony thorax is unremarkable. IMPRESSION: Aortic atherosclerosis. Increased bilateral diffuse interstitial densities are noted most consistent with pulmonary edema. Left midlung subsegmental atelectasis is noted as well. Electronically Signed   By: Marijo Conception, M.D.   On: 12/10/2016 10:26   Dg Chest Port 1 View  Result Date: 12/09/2016 CLINICAL DATA:  Follow-up shortness of breath EXAM: PORTABLE CHEST 1 VIEW COMPARISON:  12/05/2016 FINDINGS: Partial clearing of airspace disease on the left. Background of COPD with hyperinflation and emphysematous changes. Borderline heart size. Stable negative  mediastinal contours. IMPRESSION: 1. Partial clearing of left upper lobe pneumonia. Followup PA and lateral chest X-ray is recommended in 3-4 weeks following to ensure resolution. 2. COPD. Electronically Signed   By: Monte Fantasia M.D.   On: 12/09/2016 09:48    Discharge Exam: Vitals:   12/11/16 2023 12/12/16 0652  BP: (!) 157/64 (!) 148/68  Pulse: 61 69  Resp: 16 16  Temp: 98.2 F (36.8 C) 98.2 F (36.8 C)   Vitals:   12/12/16 0652 12/12/16 0754 12/12/16 0759 12/12/16 0814  BP: (!) 148/68     Pulse: 69     Resp: 16     Temp: 98.2 F (36.8 C)     TempSrc: Oral     SpO2: 97% 98% 98%   Weight:    53.9 kg (118 lb 14.4 oz)  Height:        General: Pt is alert, awake, not in acute distress Cardiovascular: RRR, S1/S2 +, no rubs, no gallops Respiratory: Good air entry, mild force expiratory wheezing. No rales or crackles  Abdominal: Soft, NT, ND, bowel sounds + Extremities: trace edema   The results of significant diagnostics from this hospitalization (including imaging, microbiology, ancillary and laboratory) are listed below for reference.     Microbiology: Recent Results (from the past 240 hour(s))  Blood Culture (routine x 2)     Status: None   Collection Time: 12/05/16  5:27 PM  Result Value Ref Range Status   Specimen Description RIGHT ANTECUBITAL  Final   Special Requests   Final    BOTTLES DRAWN AEROBIC AND ANAEROBIC Blood Culture adequate volume   Culture   Final    NO GROWTH 5 DAYS Performed at [facility], [address] [place], [place] [address]    Report Status 12/10/2016 FINAL  Final  Blood Culture (routine x 2)     Status: None   Collection Time: 12/05/16  5:27  PM  Result Value Ref Range Status   Specimen Description LEFT ANTECUBITAL  Final   Special Requests   Final    BOTTLES DRAWN AEROBIC AND ANAEROBIC Blood Culture adequate volume   Culture   Final    NO GROWTH 5 DAYS Performed at [facility], [address] [place], [place]  [address]    Report Status 12/10/2016 FINAL  Final  Urine culture     Status: Abnormal   Collection Time: 12/05/16  5:27 PM  Result Value Ref Range Status   Specimen Description URINE, RANDOM  Final   Special Requests NONE  Final   Culture MULTIPLE SPECIES PRESENT, SUGGEST RECOLLECTION (A)  Final   Report Status 12/07/2016 FINAL  Final  MRSA PCR Screening     Status: None   Collection Time: 12/06/16  3:14 AM  Result Value Ref Range Status   MRSA by PCR NEGATIVE NEGATIVE Final    Comment:        The GeneXpert MRSA Assay (FDA approved for NASAL specimens only), is one component of a comprehensive MRSA colonization surveillance program. It is not intended to diagnose MRSA infection nor to guide or monitor treatment for MRSA infections.      Labs: BNP (last 3 results)  Recent Labs  07/24/16 1933 07/26/16 1225 08/04/16 2242  BNP 40.6 44.2 10.2   Basic Metabolic Panel:  Recent Labs Lab 12/06/16 0416 12/07/16 0351 12/08/16 0421 12/11/16 0527 12/12/16 0546  NA 141 140 142 143 141  K 3.3* 4.4 4.9 3.8 3.7  CL 111 113* 110 97* 92*  CO2 23 20* 24 39* 37*  GLUCOSE 113* 134* 118* 84 118*  BUN 21* 19 18 35* 29*  CREATININE 0.53 0.46 0.48 0.40* 0.36*  CALCIUM 7.2* 7.6* 7.9* 8.7* 8.8*  MG 1.3* 2.3  --   --   --   PHOS 3.2  --   --   --   --    Liver Function Tests:  Recent Labs Lab 12/05/16 1727  AST 34  ALT 26  ALKPHOS 50  BILITOT 1.1  PROT 6.6  ALBUMIN 3.6    Recent Labs Lab 12/05/16 1727  LIPASE 13   CBC:  Recent Labs Lab 12/05/16 1727 12/06/16 0416 12/08/16 0421 12/11/16 0527  WBC 9.1 6.8 8.5 9.1  NEUTROABS 7.6  --   --   --   HGB 14.7 11.9* 11.9* 12.6  HCT 44.6 36.9 37.4 40.2  MCV 99.3 98.7 101.1* 102.0*  PLT 171 135* 188 181   Cardiac Enzymes:  Recent Labs Lab 12/10/16 1040 12/10/16 1635 12/10/16 2201  TROPONINI <0.03 <0.03 <0.03   BNP: Invalid input(s): POCBNP CBG: No results for input(s): GLUCAP in the last 168  hours. D-Dimer No results for input(s): DDIMER in the last 72 hours. Hgb A1c No results for input(s): HGBA1C in the last 72 hours. Lipid Profile No results for input(s): CHOL, HDL, LDLCALC, TRIG, CHOLHDL, LDLDIRECT in the last 72 hours. Thyroid function studies No results for input(s): TSH, T4TOTAL, T3FREE, THYROIDAB in the last 72 hours.  Invalid input(s): FREET3 Anemia work up No results for input(s): VITAMINB12, FOLATE, FERRITIN, TIBC, IRON, RETICCTPCT in the last 72 hours. Urinalysis    Component Value Date/Time   COLORURINE AMBER (A) 12/05/2016 1727   APPEARANCEUR HAZY (A) 12/05/2016 1727   LABSPEC 1.015 12/05/2016 1727   PHURINE 5.0 12/05/2016 1727   GLUCOSEU NEGATIVE 12/05/2016 [place] 08/09/2015 1504   HGBUR NEGATIVE 12/05/2016 1727  BILIRUBINUR NEGATIVE 12/05/2016 1727   KETONESUR NEGATIVE 12/05/2016 1727   PROTEINUR 100 (A) 12/05/2016 1727   UROBILINOGEN 0.2 08/09/2015 1504   NITRITE NEGATIVE 12/05/2016 1727   LEUKOCYTESUR LARGE (A) 12/05/2016 1727   Sepsis Labs Invalid input(s): PROCALCITONIN,  WBC,  LACTICIDVEN Microbiology Recent Results (from the past 240 hour(s))  Blood Culture (routine x 2)     Status: None   Collection Time: 12/05/16  5:27 PM  Result Value Ref Range Status   Specimen Description RIGHT ANTECUBITAL  Final   Special Requests   Final    BOTTLES DRAWN AEROBIC AND ANAEROBIC Blood Culture adequate volume   Culture   Final    NO GROWTH 5 DAYS Performed at [facility], [address], [place], [place] [address]    Report Status 12/10/2016 FINAL  Final  Blood Culture (routine x 2)     Status: None   Collection Time: 12/05/16  5:27 PM  Result Value Ref Range Status   Specimen Description LEFT ANTECUBITAL  Final   Special Requests   Final    BOTTLES DRAWN AEROBIC AND ANAEROBIC Blood Culture adequate volume   Culture   Final    NO GROWTH 5 DAYS Performed at [facility], [place] [address], [place], [place]  [address]    Report Status 12/10/2016 FINAL  Final  Urine culture     Status: Abnormal   Collection Time: 12/05/16  5:27 PM  Result Value Ref Range Status   Specimen Description URINE, RANDOM  Final   Special Requests NONE  Final   Culture MULTIPLE SPECIES PRESENT, SUGGEST RECOLLECTION (A)  Final   Report Status 12/07/2016 FINAL  Final  MRSA PCR Screening     Status: None   Collection Time: 12/06/16  3:14 AM  Result Value Ref Range Status   MRSA by PCR NEGATIVE NEGATIVE Final    Comment:        The GeneXpert MRSA Assay (FDA approved for NASAL specimens only), is one component of a comprehensive MRSA colonization surveillance program. It is not intended to diagnose MRSA infection nor to guide or monitor treatment for MRSA infections.     Time coordinating discharge: 38 minutes  SIGNED:  Chipper Oman, MD  [facility] 12/12/2016, 2:54 PM  Pager please text page via  [email or web address] Password [email or web address]

## 2016-12-15 ENCOUNTER — Other Ambulatory Visit: Payer: Self-pay

## 2016-12-15 NOTE — Patient Outreach (Signed)
Transition of care: New referral.  Previously followed by telephonic. [name] Discharge date of 12/12/2016.  Placed call to patient and explained reason for call. Patient reports that she is weak and lives alone. Reports that she is managing.   Reviewed discharge instructions with patient. Patient confirms that she has follow up planned with primary MD.   States that she is not weighing and did not know to do so. I reviewed reasons for daily weights and when to call the MD.  Patient reports that she will start weighing tomorrow.   Patient able to confirm all medications and reports that she is taking them without difficulty.  Patient reports that she has not heard from home health.   Outpatient Encounter Prescriptions as of 12/15/2016  Medication Sig  . albuterol (PROVENTIL HFA) 108 (90 Base) MCG/ACT inhaler Inhale 2 puffs into the lungs every 6 (six) hours as needed for wheezing or shortness of breath.  [name] aspirin EC 81 MG tablet Take 81 mg by mouth at bedtime.  [name] atorvastatin (LIPITOR) 20 MG tablet TAKE 1 TABLET BY MOUTH ONCE DAILY (Patient taking differently: Take 1 tablet by mouth at bedtime)  . b complex vitamins tablet Take 1 tablet by mouth at bedtime.  . budesonide (PULMICORT) 0.5 MG/2ML nebulizer solution Take 2 mLs (0.5 mg total) by nebulization 2 (two) times daily. If unable to use Symbicort  . budesonide-formoterol (SYMBICORT) 160-4.5 MCG/ACT inhaler Inhale 2 puffs into the lungs 2 (two) times daily.  . clonazePAM (KLONOPIN) 0.5 MG tablet Take 1 tablet (0.5 mg total) by mouth 2 (two) times daily as needed for anxiety.  [name] dextromethorphan-guaiFENesin (MUCINEX DM) 30-600 MG 12hr tablet Take 2 tablets by mouth 2 (two) times daily.  [name] donepezil (ARICEPT) 10 MG tablet Take 1 tablet (10 mg total) by mouth at bedtime.  [name] escitalopram (LEXAPRO) 20 MG tablet Take 0.5 tablets (10 mg total) by mouth daily.  . fluconazole (DIFLUCAN) 200 MG tablet Take 1 tablet (200 mg total) by mouth daily.  .  furosemide (LASIX) 40 MG tablet Take 0.5 tablets (20 mg total) by mouth daily.  [name] gabapentin (NEURONTIN) 100 MG capsule Take 100 mg by mouth 3 (three) times daily.  . hydrochlorothiazide (HYDRODIURIL) 25 MG tablet Take 1 tablet (25 mg total) by mouth daily.  . montelukast (SINGULAIR) 10 MG tablet TAKE 1 TABLET BY MOUTH EVERY NIGHT AT BEDTIME  . predniSONE (DELTASONE) 10 MG tablet Take 4 tablets for 3 days; Take 3 tablets for 4 days; Take 2 tablets for 3 days; Take 1 tablet for 4 days  . tiotropium (SPIRIVA HANDIHALER) 18 MCG inhalation capsule Place 1 capsule (18 mcg total) into inhaler and inhale daily.   No facility-administered encounter medications on file as of 12/15/2016.    Reports wearing oxygen all the time.  Plan: Offered home visit for 5/24 and patient has accepted.  Patient to see primary MD on 5/23. Will send barrier letter to MD. Will also contact home health company to confirm orders.   THN CM Care Plan Problem One     Most Recent Value  Care Plan Problem One  Recent admission for COPD and pneumonia  Role Documenting the Problem One  Care Management Coordinator  Care Plan for Problem One  Active  THN Long Term Goal (31-90 days)  Patient will report no readmissions for COPD or pneumonia in the next 60 days.  THN Long Term Goal Start Date  12/15/16  Interventions for Problem One Long Term Goal  Reviewed [facility]  transition of care program. Provided my contact information. Reviewed discharge medications.   THN CM Short Term Goal #1 (0-30 days)  Patient will weigh and record weights daily for the next 30 days.  THN CM Short Term Goal #1 Start Date  12/15/16  Interventions for Short Term Goal #1  Reviewed importance of daily weights and when to call MD. Reviewed CHF zones. Encouraged pateint to start weighing daily. Home visit scheduled.   THN CM Short Term Goal #2 (0-30 days)  Patient will attend primary MD hospital follow up in 7 days.   THN CM Short Term Goal #2 Start Date   12/15/16  Interventions for Short Term Goal #2  Reviewed pending follow up appointment with patient.   THN CM Short Term Goal #3 (0-30 days)  Patient will report home health services have started in the next 4 days.  THN CM Short Term Goal #3 Start Date  12/15/16  Interventions for Short Tern Goal #3  Reviewed home health orders with patient and will call home health to confirm orders.      Tomasa Rand, RN, BSN, CEN [facility] [employer] [phone]

## 2016-12-15 NOTE — Patient Outreach (Signed)
Care Coordination:  Placed call to [facility] [phone] spoke with Terrence Dupont who states order received and patient due to be contacted tomorrow for start of care.  Tomasa Rand, RN, BSN, CEN [facility] [employer] [phone]

## 2016-12-16 ENCOUNTER — Ambulatory Visit: Payer: Medicare Other | Admitting: Internal Medicine

## 2016-12-17 ENCOUNTER — Ambulatory Visit (INDEPENDENT_AMBULATORY_CARE_PROVIDER_SITE_OTHER): Payer: Medicare Other | Admitting: Internal Medicine

## 2016-12-17 ENCOUNTER — Other Ambulatory Visit: Payer: Self-pay | Admitting: Pharmacist

## 2016-12-17 VITALS — BP 106/66 | HR 69 | Ht 61.0 in | Wt 124.0 lb

## 2016-12-17 DIAGNOSIS — J9601 Acute respiratory failure with hypoxia: Secondary | ICD-10-CM | POA: Diagnosis not present

## 2016-12-17 DIAGNOSIS — J441 Chronic obstructive pulmonary disease with (acute) exacerbation: Secondary | ICD-10-CM | POA: Diagnosis not present

## 2016-12-17 DIAGNOSIS — J189 Pneumonia, unspecified organism: Secondary | ICD-10-CM

## 2016-12-17 MED ORDER — METHYLPREDNISOLONE ACETATE 80 MG/ML IJ SUSP
80.0000 mg | Freq: Once | INTRAMUSCULAR | Status: AC
  Administered 2016-12-17: 80 mg via INTRAMUSCULAR

## 2016-12-17 NOTE — Patient Outreach (Signed)
[facility]) Care Management  [place]   12/17/2016  Michele Martin [birth year] [id number]  Subjective: Receive a call back from [facility] Michele Martin reports that she is no longer in need of medication assistance. Reports that she was able to obtain her Spiriva inhaler and is now taking this as directed. Patient states that she feels like she is on a lot of medications and requests a medication review.  Review with patient each of her medications including name, strength, indication and administration. Update EPIC medication list accordingly. Also review with patient her allergy list.   Verbally review with patient her inhaler technique. Patient reports that she recently had an oral yeast infection. Counsel patient on the importance of rinsing her mouth following each use of her Symbicort inhaler.   Counsel patient about only using Mucinex as needed for her cough. Counsel patient about sedation risk with clonazepam and gabapentin. Patient states that she only uses the gabapentin as needed for pain. Patient verbalizes understanding. Denies any falls in the past year.  Michele Martin reports that she has a post-discharge follow-up appointment today with her PCP.  Counsel patient about the cost savings of using mail order pharmacy. Provide patient with this phone number.  Patient denies any further medication questions/concerns at this time. Confirm that patient has my phone number.   Objective:   Encounter Medications: Outpatient Encounter Prescriptions as of 12/17/2016  Medication Sig Note  . albuterol (PROVENTIL HFA) 108 (90 Base) MCG/ACT inhaler Inhale 2 puffs into the lungs every 6 (six) hours as needed for wheezing or shortness of breath.   [name] aspirin EC 81 MG tablet Take 81 mg by mouth at bedtime.   [name] atorvastatin (LIPITOR) 20 MG tablet TAKE 1 TABLET BY MOUTH ONCE DAILY (Patient taking differently: Take 1 tablet by mouth at bedtime)   . b  complex vitamins tablet Take 1 tablet by mouth at bedtime.   . budesonide-formoterol (SYMBICORT) 160-4.5 MCG/ACT inhaler Inhale 2 puffs into the lungs 2 (two) times daily.   . clonazePAM (KLONOPIN) 0.5 MG tablet Take 1 tablet (0.5 mg total) by mouth 2 (two) times daily as needed for anxiety.   [name] dextromethorphan-guaiFENesin (MUCINEX DM) 30-600 MG 12hr tablet Take 2 tablets by mouth 2 (two) times daily.   [name] donepezil (ARICEPT) 10 MG tablet Take 1 tablet (10 mg total) by mouth at bedtime. 12/17/2016: As needed  . escitalopram (LEXAPRO) 20 MG tablet Take 0.5 tablets (10 mg total) by mouth daily.   . furosemide (LASIX) 40 MG tablet Take 0.5 tablets (20 mg total) by mouth daily.   [name] gabapentin (NEURONTIN) 100 MG capsule Take 100 mg by mouth 3 (three) times daily. 12/17/2016: As needed  . montelukast (SINGULAIR) 10 MG tablet TAKE 1 TABLET BY MOUTH EVERY NIGHT AT BEDTIME   . predniSONE (DELTASONE) 10 MG tablet Take 4 tablets for 3 days; Take 3 tablets for 4 days; Take 2 tablets for 3 days; Take 1 tablet for 4 days   . tiotropium (SPIRIVA HANDIHALER) 18 MCG inhalation capsule Place 1 capsule (18 mcg total) into inhaler and inhale daily.   . [DISCONTINUED] budesonide (PULMICORT) 0.5 MG/2ML nebulizer solution Take 2 mLs (0.5 mg total) by nebulization 2 (two) times daily. If unable to use Symbicort     Assessment:  Patient was recently discharged from hospital and all medications have been reviewed.  Drugs sorted by system:  Neurologic/Psychologic: clonazepam, donepezil, escitalopram  Cardiovascular: aspirin, atorvastatin, furosemide  Pulmonary/Allergy: albuterol, Symbicort,  Spiriva, montelukast, prednisone  Pain: gabapentin  Vitamins/Minerals: Vitamin B complex  Miscellaneous: Mucinex  Duplications in therapy: budesonide nebulizer solution and Symbicort- Patient counseled, per directions on budesonide prescription, to discontinue the budesonide nebulizer solution as she reports that she is  consistently using the Symbicort as directed.  Medications to avoid in the elderly: clonazepam Drug interactions:   -clonazepam + gabapentin: CNS depressants may enhance the adverse/toxic effect of other CNS depressants.  Patient counseled  Plan:  No significant pharmacy issues noted. Will close pharmacy episode at this time.  Harlow Asa, PharmD, [facility] [phone]

## 2016-12-17 NOTE — Progress Notes (Signed)
Subjective:    Patient ID: Michele Martin, female    DOB: Jan 09, 1946, 71 y.o.   MRN: [id number]  HPI  Here ater recent hospn may 11 - 18 with copd exacerbation, pneumonia/sepsis, then unfortunately developed volume overload /pulm edema requiring some diuresis.  Now on 2L [place] continous now, Has persistent intermittent CP, anterior, mild, pleuritic, without radation or diaphroesis, n/v or palps. Still feels "punk" with tightness and sob/doe but at least the wheezing much improved Wt Readings from Last 3 Encounters:  12/17/16 124 lb (56.2 kg)  12/12/16 118 lb 14.4 oz (53.9 kg)  12/05/16 112 lb (50.8 kg)  Has HH with RN and o2,, also for PT soon.  Pt denies other chest pain,  orthopnea, PND, increased LE swelling, palpitations, dizziness or syncope.  Pt denies new neurological symptoms such as new headache, or facial or extremity weakness or numbness   Pt denies polydipsia, polyuria.  Overall good compliance with treatment, and good medicine tolerability, including finishing her recent prednisone post hospn  Denies urinary symptoms such as dysuria, frequency, urgency, flank pain, hematuria or n/v, fever, chills. Past Medical History:  Diagnosis Date  . Acute angle-closure glaucoma 02/14/2010  . ALLERGIC RHINITIS 09/19/2009  . ANXIETY 04/15/2010  . Arthritis   . ASTHMA 09/19/2009  . BACK PAIN 02/14/2010  . Chronic bronchitis 04/14/2011  . CHRONIC OBSTRUCTIVE PULMONARY DISEASE, ACUTE EXACERBATION 12/19/2009  . Complication of anesthesia   . COPD 06/27/2009  . DEPRESSION 09/19/2009  . HOARSENESS 10/17/2009  . HYPERLIPIDEMIA 09/19/2009  . MENOPAUSAL DISORDER 08/20/2010  . OSTEOPENIA 09/10/2010  . Osteoporosis, unspecified 04/20/2014  . Rotator cuff tear 06/23/2011  . TINNITUS 09/10/2010  . TREMOR 02/14/2010  . Tremor 07/30/2011  . URI 09/10/2010  . Wheezing 06/27/2009   Past Surgical History:  Procedure Laterality Date  . ABDOMINAL HYSTERECTOMY    . cervical fusion 2013 - Dr Vertell Limber    . Normal Heart  Cath  2001   Dr. Johnsie Cancel  . s/p laser tx for glaucoma     no vision loss    reports that she has quit smoking. Her smoking use included Cigarettes. She started smoking about 17 months ago. She has a 25.00 pack-year smoking history. She has never used smokeless tobacco. She reports that she does not drink alcohol or use drugs. family history includes Dementia in her mother; Rheum arthritis in her maternal uncle. Allergies  Allergen Reactions  . Pravastatin Itching and Other (See Comments)    Reaction:  Muscle cramps   . Simvastatin Itching and Other (See Comments)    Reaction:  Muscle cramps   . Statins Itching and Other (See Comments)    Reaction:  Muscle cramps   Current Outpatient Prescriptions on File Prior to Visit  Medication Sig Dispense Refill  . albuterol (PROVENTIL HFA) 108 (90 Base) MCG/ACT inhaler Inhale 2 puffs into the lungs every 6 (six) hours as needed for wheezing or shortness of breath. 18 g 5  . aspirin EC 81 MG tablet Take 81 mg by mouth at bedtime.    [name] atorvastatin (LIPITOR) 20 MG tablet TAKE 1 TABLET BY MOUTH ONCE DAILY (Patient taking differently: Take 1 tablet by mouth at bedtime) 90 tablet 3  . b complex vitamins tablet Take 1 tablet by mouth at bedtime.    . budesonide-formoterol (SYMBICORT) 160-4.5 MCG/ACT inhaler Inhale 2 puffs into the lungs 2 (two) times daily. 3 Inhaler 3  . clonazePAM (KLONOPIN) 0.5 MG tablet Take 1 tablet (0.5 mg total) by mouth  2 (two) times daily as needed for anxiety. 60 tablet 5  . dextromethorphan-guaiFENesin (MUCINEX DM) 30-600 MG 12hr tablet Take 2 tablets by mouth 2 (two) times daily. 60 tablet 0  . donepezil (ARICEPT) 10 MG tablet Take 1 tablet (10 mg total) by mouth at bedtime.    [name] escitalopram (LEXAPRO) 20 MG tablet Take 0.5 tablets (10 mg total) by mouth daily. 45 tablet 3  . furosemide (LASIX) 40 MG tablet Take 0.5 tablets (20 mg total) by mouth daily. 30 tablet 0  . gabapentin (NEURONTIN) 100 MG capsule Take 100 mg by mouth 3  (three) times daily.    . montelukast (SINGULAIR) 10 MG tablet TAKE 1 TABLET BY MOUTH EVERY NIGHT AT BEDTIME 30 tablet 11  . predniSONE (DELTASONE) 10 MG tablet Take 4 tablets for 3 days; Take 3 tablets for 4 days; Take 2 tablets for 3 days; Take 1 tablet for 4 days 34 tablet 0  . tiotropium (SPIRIVA HANDIHALER) 18 MCG inhalation capsule Place 1 capsule (18 mcg total) into inhaler and inhale daily. 90 capsule 3   No current facility-administered medications on file prior to visit.     Review of Systems  Constitutional: Negative for other unusual diaphoresis or sweats HENT: Negative for ear discharge or swelling Eyes: Negative for other worsening visual disturbances Respiratory: Negative for stridor or other swelling  Gastrointestinal: Negative for worsening distension or other blood Genitourinary: Negative for retention or other urinary change Musculoskeletal: Negative for other MSK pain or swelling Skin: Negative for color change or other new lesions Neurological: Negative for worsening tremors and other numbness  Psychiatric/Behavioral: Negative for worsening agitation or other fatigue All other system neg per pt    Objective:   Physical Exam BP 106/66   Pulse 69   Ht '5\' 1"'$  (1.549 m)   Wt 124 lb (56.2 kg)   SpO2 99%   BMI 23.43 kg/m  VS noted, fatigued and generally weak, slow to get up on exam table with mild assist only Constitutional: Pt appears in NAD HENT: Head: NCAT.  Right Ear: External ear normal.  Left Ear: External ear normal.  Eyes: . Pupils are equal, round, and reactive to light. Conjunctivae and EOM are normal Nose: without d/c or deformity Neck: Neck supple. Gross normal ROM Cardiovascular: Normal rate and regular rhythm.   Pulmonary/Chest: Effort normal and breath sounds moderately decreased without rales or wheezing.  Abd:  Soft, NT, ND, + BS, no organomegaly Neurological: Pt is alert. At baseline orientation, motor grossly intact Skin: Skin is warm. No  rashes, other new lesions, no LE edema Psychiatric: Pt behavior is normal without agitation  No other exam findings    Assessment & Plan:

## 2016-12-17 NOTE — Patient Instructions (Addendum)
You had the steroid shot today  Please continue all other medications as before, and refills have been done if requested.  Please have the pharmacy call with any other refills you may need.  Please keep your appointments with your specialists as you may have planned  Please return in 3 months, or sooner if needed

## 2016-12-18 ENCOUNTER — Other Ambulatory Visit: Payer: Self-pay

## 2016-12-18 NOTE — Patient Outreach (Signed)
Michele Martin) Care Management   12/18/2016  Michele Martin [birth year] [id number]  Michele Martin is an 71 y.o. female 1pm arrived for home visit.  Sister Michele Martin present and engaged in home visit.  Subjective:  Patient reports that she is weak from the recent pneumonia.  Reports that she needs therapy.  Also states that she has burning on urination. Reports that she had Martin follow up yesterday with MD and nothing was done about the burning on urination. Patient reports that prior to his hospitalization she was wearing oxygen only at night.  Now reports 2 liters nasal canula all the time.  Reports that she has portable oxygen as well.  Patient lives at home alone with the exception of Tomasita Crumble her dog.  Sister, Michele Martin lives 6 miles away and is very active in care of patient.  Patient reports that home health nurse has not come and so the physical therapist can not start. Objective:   Awake and alert. Ambulating slowly with walker in the home. Home neat and clean.  Wheezing noted bilaterally in all lung fields. Wearing oxygen at 3.5 liters on concentrator. Per patient she is on 2 liters but is on 3.5 due to long oxygen tubing in the home.  Vitals:   12/18/16 1328  BP: (!) 102/50  Pulse: 78  Resp: (!) 22  SpO2: 98%  Weight: 103 lb (46.7 kg)  Height: 1.549 m (_0 )   Review of Systems  Constitutional: Positive for malaise/fatigue.  Respiratory: Positive for cough, sputum production and shortness of breath.   Cardiovascular:       Feels chest is tight for about 2 weeks  Gastrointestinal: Negative.   Genitourinary: Positive for dysuria and frequency.  Musculoskeletal: Negative.   Skin: Negative.   Neurological: Negative.   Endo/Heme/Allergies: Bruises/bleeds easily.  Psychiatric/Behavioral: Positive for depression. The patient is nervous/anxious.     Physical Exam  Constitutional: She is oriented to person, place, and time. She appears well-developed.   thin  Cardiovascular: Normal rate, regular rhythm and normal heart sounds.   Respiratory: Effort normal. She has wheezes.  GI: Soft. Bowel sounds are normal.  Musculoskeletal: Normal range of motion. She exhibits no edema.  Neurological: She is alert and oriented to person, place, and time.  Year 2018, trump,  Thursday, May  Able to answer all orientation questions correctly  Skin: Skin is warm and dry.  Psychiatric: She has a normal mood and affect. Her behavior is normal. Judgment and thought content normal.    Encounter Medications:   Outpatient Encounter Prescriptions as of 12/18/2016  Medication Sig Note  . albuterol (PROVENTIL HFA) 108 (90 Base) MCG/ACT inhaler Inhale 2 puffs into the lungs every 6 (six) hours as needed for wheezing or shortness of breath.   [name] aspirin EC 81 MG tablet Take 81 mg by mouth at bedtime.   [name] atorvastatin (LIPITOR) 20 MG tablet TAKE 1 TABLET BY MOUTH ONCE DAILY (Patient taking differently: Take 1 tablet by mouth at bedtime)   . b complex vitamins tablet Take 1 tablet by mouth at bedtime.   . budesonide-formoterol (SYMBICORT) 160-4.5 MCG/ACT inhaler Inhale 2 puffs into the lungs 2 (two) times daily.   . clonazePAM (KLONOPIN) 0.5 MG tablet Take 1 tablet (0.5 mg total) by mouth 2 (two) times daily as needed for anxiety.   [name] dextromethorphan-guaiFENesin (MUCINEX DM) 30-600 MG 12hr tablet Take 2 tablets by mouth 2 (two) times daily.   [name] donepezil (ARICEPT) 10 MG tablet Take 1  tablet (10 mg total) by mouth at bedtime. 12/17/2016: As needed  . escitalopram (LEXAPRO) 20 MG tablet Take 0.5 tablets (10 mg total) by mouth daily.   . furosemide (LASIX) 40 MG tablet Take 0.5 tablets (20 mg total) by mouth daily.   [name] gabapentin (NEURONTIN) 100 MG capsule Take 100 mg by mouth 3 (three) times daily. 12/17/2016: As needed  . montelukast (SINGULAIR) 10 MG tablet TAKE 1 TABLET BY MOUTH EVERY NIGHT AT BEDTIME   . predniSONE (DELTASONE) 10 MG tablet Take 4 tablets for 3 days;  Take 3 tablets for 4 days; Take 2 tablets for 3 days; Take 1 tablet for 4 days   . tiotropium (SPIRIVA HANDIHALER) 18 MCG inhalation capsule Place 1 capsule (18 mcg total) into inhaler and inhale daily.    No facility-administered encounter medications on file as of 12/18/2016.     Functional Status:   In your present state of health, do you have any difficulty performing the following activities: 12/18/2016 12/05/2016  Hearing? N N  Vision? N N  Difficulty concentrating or making decisions? Y N  Walking or climbing stairs? Y Y  Dressing or bathing? N N  Doing errands, shopping? Y N  Preparing Food and eating ? N -  Using the Toilet? N -  In the past six months, have you accidently leaked urine? N -  Do you have problems with loss of bowel control? N -  Managing your Medications? N -  Managing your Finances? N -  Housekeeping or managing your Housekeeping? N -  Some recent data might be hidden    Fall/Depression Screening:    Fall Risk  12/18/2016 12/17/2016 11/20/2016  Falls in the past year? No No No  Number falls in past yr: - - -  Injury with Fall? - - -  Risk Factor Category  - - -  Risk for fall due to : - - Impaired balance/gait;Impaired mobility  Follow up - - -   PHQ 2/9 Scores 12/18/2016 11/20/2016 09/30/2016 09/30/2016 09/22/2016 08/21/2016 06/25/2016  PHQ - 2 Score 6 0 2 1 0 0 1  PHQ- 9 Score 13 - 6 - - - -    Assessment:   (1) Reviewed [facility] transition of care program.  Reviewed consent obtained in the Martin and patient declines need for new consent.  Patient has her new patient packet  In her home with her copy of the consent. Reviewed [facility] magnet and 24 hour call a nurse line. Reviewed [facility] calendar. (2) positive depression screening. (3)wheezing noted all lung fields. (4) new diagnosis of CHF. (5) home health nurse has not opened case yet. (6) reports dysuria.   Plan:  (1) consent in EMR. (2) will notify MD and send this note. (3) Encouraged patient to use her  nebulizer and inhalers as prescribed. Reviewed when to call MD for worsening condition. (4) Reviewed CHF zones, importance of daily weight. Provided and reviewed low salt diet and low salt food tear off pages.  (5) Placed call to home health and spoke with Terrence Dupont.  Call back from supervisor named Caryl Pina.  Caryl Pina states that nurse and PT will see patient tomorrow. Reviewed with Caryl Pina that home health nurse told sister that she would get a urine sample tomorrow and notify MD.  (6) will send this note to MD and place call to MD to notify of urinary symptoms.    Care planning an goal setting with patient and sister during home visit. Primary goal is to avoid readmission.  Next outreach in week.  Will route this note to Md.  [facility] CM Care Plan Problem One     Most Recent Value  Care Plan Problem One  Recent admission for COPD and pneumonia  Role Documenting the Problem One  Care Management Coordinator  Care Plan for Problem One  Active  [facility] Long Term Goal   Patient will report no readmissions for COPD or pneumonia in the next 60 days.  [facility] Long Term Goal Start Date  12/15/16  Interventions for Problem One Long Term Goal  home visit completed. home health contacted to start services.   [facility] CM Short Term Goal #1   Patient will weigh and record weights daily for the next 30 days.  [facility] CM Short Term Goal #1 Start Date  12/15/16  Interventions for Short Term Goal #1  provided low salt diet information. reviewed heart failure zones.   [facility] CM Short Term Goal #2   Patient will attend primary MD Martin follow up in 7 days.   [facility] CM Short Term Goal #2 Start Date  12/15/16  [facility] CM Short Term Goal #2 Met Date  12/18/16  Interventions for Short Term Goal #2  Reviewed pending follow up appointment with patient.   [facility] CM Short Term Goal #3  Patient will report home health services have started in the next 4 days.  [facility] CM Short Term Goal #3 Start Date  12/15/16  Interventions for Short Tern Goal #3  placed call  to home health supervior to inquire about start of services.      Tomasa Rand, RN, BSN, CEN [facility] [employer] [phone]

## 2016-12-19 ENCOUNTER — Other Ambulatory Visit: Payer: Self-pay

## 2016-12-19 ENCOUNTER — Other Ambulatory Visit: Payer: Self-pay | Admitting: *Deleted

## 2016-12-19 ENCOUNTER — Ambulatory Visit: Payer: Medicare Other | Admitting: *Deleted

## 2016-12-19 ENCOUNTER — Telehealth: Payer: Self-pay | Admitting: Internal Medicine

## 2016-12-19 DIAGNOSIS — B370 Candidal stomatitis: Secondary | ICD-10-CM | POA: Diagnosis not present

## 2016-12-19 DIAGNOSIS — J441 Chronic obstructive pulmonary disease with (acute) exacerbation: Secondary | ICD-10-CM | POA: Diagnosis not present

## 2016-12-19 DIAGNOSIS — Z87891 Personal history of nicotine dependence: Secondary | ICD-10-CM | POA: Diagnosis not present

## 2016-12-19 DIAGNOSIS — J45909 Unspecified asthma, uncomplicated: Secondary | ICD-10-CM | POA: Diagnosis not present

## 2016-12-19 DIAGNOSIS — R531 Weakness: Secondary | ICD-10-CM | POA: Diagnosis not present

## 2016-12-19 DIAGNOSIS — G4733 Obstructive sleep apnea (adult) (pediatric): Secondary | ICD-10-CM | POA: Diagnosis not present

## 2016-12-19 DIAGNOSIS — Z7982 Long term (current) use of aspirin: Secondary | ICD-10-CM | POA: Diagnosis not present

## 2016-12-19 DIAGNOSIS — I5033 Acute on chronic diastolic (congestive) heart failure: Secondary | ICD-10-CM | POA: Diagnosis not present

## 2016-12-19 DIAGNOSIS — Z87440 Personal history of urinary (tract) infections: Secondary | ICD-10-CM | POA: Diagnosis not present

## 2016-12-19 DIAGNOSIS — Z9981 Dependence on supplemental oxygen: Secondary | ICD-10-CM | POA: Diagnosis not present

## 2016-12-19 MED ORDER — CEPHALEXIN 500 MG PO CAPS: 500.0000 mg | ORAL_CAPSULE | Freq: Three times a day (TID) | ORAL | 0 refills | Status: AC

## 2016-12-19 MED ORDER — ALBUTEROL SULFATE (2.5 MG/3ML) 0.083% IN NEBU: 2.5000 mg | INHALATION_SOLUTION | Freq: Four times a day (QID) | RESPIRATORY_TRACT | 1 refills | Status: DC | PRN

## 2016-12-19 MED ORDER — ALBUTEROL SULFATE (5 MG/ML) 0.5% IN NEBU: 2.5000 mg | INHALATION_SOLUTION | Freq: Four times a day (QID) | RESPIRATORY_TRACT | 0 refills | Status: DC | PRN

## 2016-12-19 NOTE — Telephone Encounter (Signed)
To be safe, ok for cephalexin course - done erx

## 2016-12-19 NOTE — Telephone Encounter (Signed)
Pt has been informed med was sent.

## 2016-12-19 NOTE — Telephone Encounter (Signed)
Ok for verbals 

## 2016-12-19 NOTE — Telephone Encounter (Signed)
[place]  Need for verbal for PT  1 week 1 3 week 2 2 week 3 1 week 1

## 2016-12-19 NOTE — Telephone Encounter (Signed)
Verbal orders given to [facility]

## 2016-12-19 NOTE — Assessment & Plan Note (Signed)
Resolved, sat 99% today, cont to follow

## 2016-12-19 NOTE — Telephone Encounter (Signed)
Ok for verbal 

## 2016-12-19 NOTE — Telephone Encounter (Signed)
Michele Martin called concerning first nebulizer solution that was sent she stated the albuterol sol should have been the one that is premix. Resent the rx Albuterol 2.5 mg (0.083%) that was rx back in March. She stated that was the correct script,.,,/lmb

## 2016-12-19 NOTE — Patient Outreach (Signed)
Care Coordination: Placed call to MD office to report Dysuria. Left message with Tammy who will notify MD.  Tomasa Rand, RN, BSN, CEN [place] Coordinator [phone]

## 2016-12-19 NOTE — Telephone Encounter (Signed)
Left VM for Santiago Glad with ok for verbal orders.

## 2016-12-19 NOTE — Assessment & Plan Note (Addendum)
Improved, for depomedrol 80 IM,  but will need extension of prednisone for 1 more week,  to f/u any worsening symptoms or concerns

## 2016-12-19 NOTE — Assessment & Plan Note (Signed)
Clinically resolved, declines f/u cxr, cont same tx,  to f/u any worsening symptoms or concerns

## 2016-12-19 NOTE — Telephone Encounter (Signed)
[facility]  [phone]   Verbal for Nursing  3 Week 2 2 week 1  +add OT for bathing and asstaince with energy conservation   +Social  work to assist with community recourses

## 2016-12-19 NOTE — Telephone Encounter (Signed)
Did home visit yesterday and sent note over.  Michele Martin is having symptoms of UTI and burning with urination.  States Michele Martin did report to Dr. Jenny Reichmann but Michele Martin is still having symptoms.

## 2016-12-19 NOTE — Telephone Encounter (Signed)
Rec'd call from pt sister [name]) she states pharmacy states she need new rx for her albuterol sulfate sol for her nebulizer machine. Verified pharmacy inform will send to [facility]...[name]

## 2016-12-22 DIAGNOSIS — J449 Chronic obstructive pulmonary disease, unspecified: Secondary | ICD-10-CM | POA: Diagnosis not present

## 2016-12-22 DIAGNOSIS — R0902 Hypoxemia: Secondary | ICD-10-CM | POA: Diagnosis not present

## 2016-12-23 ENCOUNTER — Telehealth: Payer: Self-pay

## 2016-12-23 NOTE — Telephone Encounter (Signed)
Advised patient that she is due for prolia injection anytime on/after Dec 16, 2016---estimated $220 copay, patient will call back to schedule nurse visit when she can come in

## 2016-12-24 ENCOUNTER — Other Ambulatory Visit: Payer: Self-pay

## 2016-12-24 DIAGNOSIS — J441 Chronic obstructive pulmonary disease with (acute) exacerbation: Secondary | ICD-10-CM | POA: Diagnosis not present

## 2016-12-24 DIAGNOSIS — G4733 Obstructive sleep apnea (adult) (pediatric): Secondary | ICD-10-CM | POA: Diagnosis not present

## 2016-12-24 DIAGNOSIS — J45909 Unspecified asthma, uncomplicated: Secondary | ICD-10-CM | POA: Diagnosis not present

## 2016-12-24 DIAGNOSIS — Z87440 Personal history of urinary (tract) infections: Secondary | ICD-10-CM | POA: Diagnosis not present

## 2016-12-24 DIAGNOSIS — Z9981 Dependence on supplemental oxygen: Secondary | ICD-10-CM | POA: Diagnosis not present

## 2016-12-24 DIAGNOSIS — I5033 Acute on chronic diastolic (congestive) heart failure: Secondary | ICD-10-CM | POA: Diagnosis not present

## 2016-12-24 DIAGNOSIS — Z87891 Personal history of nicotine dependence: Secondary | ICD-10-CM | POA: Diagnosis not present

## 2016-12-24 DIAGNOSIS — Z7982 Long term (current) use of aspirin: Secondary | ICD-10-CM | POA: Diagnosis not present

## 2016-12-24 DIAGNOSIS — R531 Weakness: Secondary | ICD-10-CM | POA: Diagnosis not present

## 2016-12-24 DIAGNOSIS — B370 Candidal stomatitis: Secondary | ICD-10-CM | POA: Diagnosis not present

## 2016-12-24 NOTE — Patient Outreach (Signed)
Transition of care: Placed call to patient for weekly follow up.  Patient reports that home health did start.  Reports that she continues to have burning on urination. When questioned if she picked up her antibiotic she did not know that primary MD sent on in.  I informed patient according to MD note on 5/25.  Patient reports that she will call the pharmacy.   Denies any new problems or concerns.  PLAN: will continue weekly transition of care calls.    Tomasa Rand, RN, BSN, CEN [facility] [employer] [phone]

## 2016-12-25 ENCOUNTER — Ambulatory Visit (INDEPENDENT_AMBULATORY_CARE_PROVIDER_SITE_OTHER)
Admission: RE | Admit: 2016-12-25 | Discharge: 2016-12-25 | Disposition: A | Discharge status: Home / Self Care | Payer: Medicare Other | Source: Ambulatory Visit | Attending: Adult Health | Admitting: Adult Health

## 2016-12-25 ENCOUNTER — Ambulatory Visit (INDEPENDENT_AMBULATORY_CARE_PROVIDER_SITE_OTHER): Payer: Medicare Other | Admitting: Adult Health

## 2016-12-25 ENCOUNTER — Encounter: Payer: Self-pay | Admitting: Adult Health

## 2016-12-25 ENCOUNTER — Ambulatory Visit: Payer: Self-pay

## 2016-12-25 VITALS — BP 114/64 | HR 88 | Ht 61.0 in | Wt 100.8 lb

## 2016-12-25 DIAGNOSIS — Z87440 Personal history of urinary (tract) infections: Secondary | ICD-10-CM

## 2016-12-25 DIAGNOSIS — R531 Weakness: Secondary | ICD-10-CM | POA: Diagnosis not present

## 2016-12-25 DIAGNOSIS — J441 Chronic obstructive pulmonary disease with (acute) exacerbation: Secondary | ICD-10-CM | POA: Diagnosis not present

## 2016-12-25 DIAGNOSIS — G4733 Obstructive sleep apnea (adult) (pediatric): Secondary | ICD-10-CM | POA: Diagnosis not present

## 2016-12-25 DIAGNOSIS — J189 Pneumonia, unspecified organism: Secondary | ICD-10-CM

## 2016-12-25 DIAGNOSIS — Z87891 Personal history of nicotine dependence: Secondary | ICD-10-CM

## 2016-12-25 DIAGNOSIS — Z7982 Long term (current) use of aspirin: Secondary | ICD-10-CM | POA: Diagnosis not present

## 2016-12-25 DIAGNOSIS — B370 Candidal stomatitis: Secondary | ICD-10-CM | POA: Diagnosis not present

## 2016-12-25 DIAGNOSIS — Z9981 Dependence on supplemental oxygen: Secondary | ICD-10-CM | POA: Diagnosis not present

## 2016-12-25 DIAGNOSIS — F418 Other specified anxiety disorders: Secondary | ICD-10-CM

## 2016-12-25 DIAGNOSIS — R079 Chest pain, unspecified: Secondary | ICD-10-CM | POA: Diagnosis not present

## 2016-12-25 DIAGNOSIS — F0390 Unspecified dementia without behavioral disturbance: Secondary | ICD-10-CM

## 2016-12-25 DIAGNOSIS — I5033 Acute on chronic diastolic (congestive) heart failure: Secondary | ICD-10-CM | POA: Diagnosis not present

## 2016-12-25 DIAGNOSIS — J45909 Unspecified asthma, uncomplicated: Secondary | ICD-10-CM | POA: Diagnosis not present

## 2016-12-25 NOTE — Assessment & Plan Note (Signed)
CAP - clinically improving with abx  Check cxr today .

## 2016-12-25 NOTE — Assessment & Plan Note (Signed)
Recent flare now resolving   Plan  Patient Instructions  Chest x-ray today Continue on Symbicort and Spiriva, rinse after use. Continue on oxygen 2l/m.  Continue on Boost daily .  Follow up with Dr. Vaughan Browner in July as planned and As needed   Please contact office for sooner follow up if symptoms do not improve or worsen or seek emergency care

## 2016-12-25 NOTE — Patient Instructions (Addendum)
Chest x-ray today Continue on Symbicort and Spiriva, rinse after use. Continue on oxygen 2l/m.  Continue on Boost daily .  Follow up with Dr. Vaughan Browner in July as planned and As needed   Please contact office for sooner follow up if symptoms do not improve or worsen or seek emergency care

## 2016-12-25 NOTE — Progress Notes (Signed)
@Patient  ID: Michele Martin, female    DOB: Mar 26, 1946, 71 y.o.   MRN: [id number]  Chief Complaint  Patient presents with  . Follow-up    COPD     Referring provider: Biagio Borg, MD  HPI: 71 year old female former smoker followed for COPD GOLD D and chronic respiratory failure on 2 L oxygen  TEST  Screening CT chest 09/04/14-5.5 mm nodule in the right upper lobe, moderate to severe emphysematous changes Screening CT chest 10/24/15- resolved right upper lobe nodule, new groundglass opacity in the right lower lobe, moderate to severe asthmatic changes Chest x-ray 08/04/16-hyperinflation, no lung infiltrate   PFTs 04/20/13 FVC 1.71 [63%] FEV1 0.94 (46%) F/F 55 TLC 109% RV/TLC 148% DLCO 64% Severe obstructive avid disease with a bronchodilator response, trapping Moderate diffusion defect   12/25/2016 Follow up ; [facility] follow up  Patient presents for a post hospital follow-up. Patient was admitted earlier this month for a COPD exacerbation and community-acquired pneumonia with sepsis. Patient was treated with IV antibiotics, steroids and nebulized bronchodilators. She was discharged on azithromycin and a prednisone taper. Patient did have some decompensated diastolic heart failure. That improved with Lasix. Since discharge. Patient is feeling some better but very weak.  Was seen last week with PCP and prednisone was extended. She has few days left of this.  No chest pain, orthopnea, edema or fever.  Eating w/ no nv/d.  She remains on Symbicort and Spiriva. Starting PT at home this week.   She remains on oxygen at 2 L.    Allergies  Allergen Reactions  . Pravastatin Itching and Other (See Comments)    Reaction:  Muscle cramps   . Simvastatin Itching and Other (See Comments)    Reaction:  Muscle cramps   . Statins Itching and Other (See Comments)    Reaction:  Muscle cramps    Immunization History  Administered Date(s) Administered  . Influenza Split  04/14/2011, 04/15/2012  . Influenza Whole 03/26/2009, 04/30/2009, 04/15/2010  . Influenza,inj,Quad PF,36+ Mos 03/31/2013, 04/20/2014, 04/12/2015, 05/15/2016  . Pneumococcal Conjugate-13 07/19/2013  . Pneumococcal Polysaccharide-23 06/27/2009, 09/30/2016  . Td 07/30/2009    Past Medical History:  Diagnosis Date  . Acute angle-closure glaucoma 02/14/2010  . ALLERGIC RHINITIS 09/19/2009  . ANXIETY 04/15/2010  . Arthritis   . ASTHMA 09/19/2009  . BACK PAIN 02/14/2010  . Chronic bronchitis 04/14/2011  . CHRONIC OBSTRUCTIVE PULMONARY DISEASE, ACUTE EXACERBATION 12/19/2009  . Complication of anesthesia   . COPD 06/27/2009  . DEPRESSION 09/19/2009  . HOARSENESS 10/17/2009  . HYPERLIPIDEMIA 09/19/2009  . MENOPAUSAL DISORDER 08/20/2010  . OSTEOPENIA 09/10/2010  . Osteoporosis, unspecified 04/20/2014  . Rotator cuff tear 06/23/2011  . TINNITUS 09/10/2010  . TREMOR 02/14/2010  . Tremor 07/30/2011  . URI 09/10/2010  . Wheezing 06/27/2009    Tobacco History: History  Smoking Status  . Former Smoker  . Packs/day: 0.50  . Years: 50.00  . Types: Cigarettes  . Start date: 07/03/2015  Smokeless Tobacco  . Never Used   Counseling given: Not Answered   Outpatient Encounter Prescriptions as of 12/25/2016  Medication Sig  . albuterol (PROVENTIL HFA) 108 (90 Base) MCG/ACT inhaler Inhale 2 puffs into the lungs every 6 (six) hours as needed for wheezing or shortness of breath.  [name] albuterol (PROVENTIL) (2.5 MG/3ML) 0.083% nebulizer solution Take 3 mLs (2.5 mg total) by nebulization every 6 (six) hours as needed for wheezing or shortness of breath.  [name] aspirin EC 81 MG tablet Take 81 mg  by mouth at bedtime.  [name] atorvastatin (LIPITOR) 20 MG tablet TAKE 1 TABLET BY MOUTH ONCE DAILY (Patient taking differently: Take 1 tablet by mouth at bedtime)  . b complex vitamins tablet Take 1 tablet by mouth at bedtime.  . budesonide-formoterol (SYMBICORT) 160-4.5 MCG/ACT inhaler Inhale 2 puffs into the lungs 2 (two) times  daily.  . cephALEXin (KEFLEX) 500 MG capsule Take 1 capsule (500 mg total) by mouth 3 (three) times daily.  . clonazePAM (KLONOPIN) 0.5 MG tablet Take 1 tablet (0.5 mg total) by mouth 2 (two) times daily as needed for anxiety.  [name] dextromethorphan-guaiFENesin (MUCINEX DM) 30-600 MG 12hr tablet Take 2 tablets by mouth 2 (two) times daily.  [name] donepezil (ARICEPT) 10 MG tablet Take 1 tablet (10 mg total) by mouth at bedtime.  [name] escitalopram (LEXAPRO) 20 MG tablet Take 0.5 tablets (10 mg total) by mouth daily.  . furosemide (LASIX) 40 MG tablet Take 0.5 tablets (20 mg total) by mouth daily.  [name] gabapentin (NEURONTIN) 100 MG capsule Take 100 mg by mouth 3 (three) times daily.  . montelukast (SINGULAIR) 10 MG tablet TAKE 1 TABLET BY MOUTH EVERY NIGHT AT BEDTIME  . predniSONE (DELTASONE) 10 MG tablet Take 4 tablets for 3 days; Take 3 tablets for 4 days; Take 2 tablets for 3 days; Take 1 tablet for 4 days  . tiotropium (SPIRIVA HANDIHALER) 18 MCG inhalation capsule Place 1 capsule (18 mcg total) into inhaler and inhale daily.   No facility-administered encounter medications on file as of 12/25/2016.      Review of Systems  Constitutional:   No  weight loss, night sweats,  Fevers, chills,  +fatigue, or  lassitude.  HEENT:   No headaches,  Difficulty swallowing,  Tooth/dental problems, or  Sore throat,                No sneezing, itching, ear ache,  +nasal congestion, post nasal drip,   CV:  No chest pain,  Orthopnea, PND, swelling in lower extremities, anasarca, dizziness, palpitations, syncope.   GI  No heartburn, indigestion, abdominal pain, nausea, vomiting, diarrhea, change in bowel habits, loss of appetite, bloody stools.   Resp:    No chest wall deformity  Skin: no rash or lesions.  GU: no dysuria, change in color of urine, no urgency or frequency.  No flank pain, no hematuria   MS:  No joint pain or swelling.  No decreased range of motion.  No back pain.    Physical Exam  BP 114/64  (BP Location: Left Arm, Cuff Size: Normal)   Pulse 88   Ht 5\' 1"  (1.549 m)   Wt 100 lb 12.8 oz (45.7 kg)   SpO2 99%   BMI 19.05 kg/m   GEN: A/Ox3; pleasant , elderly on o2    HEENT:  [place]/AT,  EACs-clear, TMs-wnl, NOSE-clear, THROAT-clear, no lesions, no postnasal drip or exudate noted.   NECK:  Supple w/ fair ROM; no JVD; normal carotid impulses w/o bruits; no thyromegaly or nodules palpated; no lymphadenopathy.    RESP  Decreased BS in bases . no accessory muscle use, no dullness to percussion  CARD:  RRR, no m/r/g, no peripheral edema, pulses intact, no cyanosis or clubbing.  GI:   Soft & nt; nml bowel sounds; no organomegaly or masses detected.   Musco: Warm bil, no deformities or joint swelling noted.   Neuro: alert, no focal deficits noted.    Skin: Warm, no lesions or rashes    Lab Results:  CBC  BNP ProBNP No results found for: PROBNP  Imaging: Dg Chest 2 View  Result Date: 12/25/2016 CLINICAL DATA:  Left anterior chest pain today. EXAM: CHEST  2 VIEW COMPARISON:  CT chest 11/11/2016. Single-view of the chest 12/10/2016. FINDINGS: Interstitial edema seen on the most recent examination has resolved. The lungs are emphysematous but clear. Heart size is normal. No pneumothorax or pleural effusion. Atherosclerosis is noted. Convex left lumbar scoliosis is noted. IMPRESSION: Resolved pulmonary edema.  No acute disease. Emphysema. Atherosclerosis. Electronically Signed   By: Inge Rise M.D.   On: 12/25/2016 13:04   Dg Chest 2 View  Result Date: 12/05/2016 CLINICAL DATA:  Shortness of breath question pneumonia, history asthma, COPD, chronic bronchitis, former smoker EXAM: CHEST  2 VIEW COMPARISON:  08/04/2016 FINDINGS: Normal heart size, mediastinal contours, and pulmonary vascularity. Atherosclerotic calcification aorta. Extensive infiltrate throughout LEFT upper lobe and probably within RIGHT lower lobe as well consistent with pneumonia. No pleural effusion or  pneumothorax. Underlying emphysematous changes present. Bones demineralized with evidence of prior cervical spine surgery. IMPRESSION: COPD changes with LEFT upper lobe and RIGHT lower lobe infiltrates consistent with pneumonia. Aortic atherosclerosis. Electronically Signed   By: Lavonia Dana M.D.   On: 12/05/2016 17:25   Dg Chest Port 1 View  Result Date: 12/10/2016 CLINICAL DATA:  Shortness of breath. EXAM: PORTABLE CHEST 1 VIEW COMPARISON:  Radiograph of Dec 09, 2016. FINDINGS: Stable cardiomediastinal silhouette. Atherosclerosis of thoracic aorta is noted. No pneumothorax is noted. Increased bilateral diffuse interstitial densities are noted throughout both lungs most consistent with edema. Left midlung subsegmental atelectasis is noted. Minimal bilateral pleural effusions may be present. Bony thorax is unremarkable. IMPRESSION: Aortic atherosclerosis. Increased bilateral diffuse interstitial densities are noted most consistent with pulmonary edema. Left midlung subsegmental atelectasis is noted as well. Electronically Signed   By: Marijo Conception, M.D.   On: 12/10/2016 10:26   Dg Chest Port 1 View  Result Date: 12/09/2016 CLINICAL DATA:  Follow-up shortness of breath EXAM: PORTABLE CHEST 1 VIEW COMPARISON:  12/05/2016 FINDINGS: Partial clearing of airspace disease on the left. Background of COPD with hyperinflation and emphysematous changes. Borderline heart size. Stable negative mediastinal contours. IMPRESSION: 1. Partial clearing of left upper lobe pneumonia. Followup PA and lateral chest X-ray is recommended in 3-4 weeks following to ensure resolution. 2. COPD. Electronically Signed   By: Monte Fantasia M.D.   On: 12/09/2016 09:48     Assessment & Plan:   COPD with acute exacerbation ([place]) Recent flare now resolving   Plan  Patient Instructions  Chest x-ray today Continue on Symbicort and Spiriva, rinse after use. Continue on oxygen 2l/m.  Continue on Boost daily .  Follow up with Dr.  Vaughan Browner in July as planned and As needed   Please contact office for sooner follow up if symptoms do not improve or worsen or seek emergency care       CAP (community acquired pneumonia) CAP - clinically improving with abx  Check cxr today .      Rexene Edison, NP 12/25/2016

## 2016-12-26 ENCOUNTER — Telehealth: Payer: Self-pay | Admitting: Internal Medicine

## 2016-12-26 DIAGNOSIS — I5033 Acute on chronic diastolic (congestive) heart failure: Secondary | ICD-10-CM | POA: Diagnosis not present

## 2016-12-26 DIAGNOSIS — Z9981 Dependence on supplemental oxygen: Secondary | ICD-10-CM | POA: Diagnosis not present

## 2016-12-26 DIAGNOSIS — Z87891 Personal history of nicotine dependence: Secondary | ICD-10-CM | POA: Diagnosis not present

## 2016-12-26 DIAGNOSIS — J810 Acute pulmonary edema: Secondary | ICD-10-CM

## 2016-12-26 DIAGNOSIS — B370 Candidal stomatitis: Secondary | ICD-10-CM | POA: Diagnosis not present

## 2016-12-26 DIAGNOSIS — Z7982 Long term (current) use of aspirin: Secondary | ICD-10-CM | POA: Diagnosis not present

## 2016-12-26 DIAGNOSIS — J441 Chronic obstructive pulmonary disease with (acute) exacerbation: Secondary | ICD-10-CM | POA: Diagnosis not present

## 2016-12-26 DIAGNOSIS — J45909 Unspecified asthma, uncomplicated: Secondary | ICD-10-CM | POA: Diagnosis not present

## 2016-12-26 DIAGNOSIS — N390 Urinary tract infection, site not specified: Secondary | ICD-10-CM

## 2016-12-26 DIAGNOSIS — G4733 Obstructive sleep apnea (adult) (pediatric): Secondary | ICD-10-CM | POA: Diagnosis not present

## 2016-12-26 DIAGNOSIS — R531 Weakness: Secondary | ICD-10-CM | POA: Diagnosis not present

## 2016-12-26 DIAGNOSIS — Z87440 Personal history of urinary (tract) infections: Secondary | ICD-10-CM | POA: Diagnosis not present

## 2016-12-26 NOTE — Telephone Encounter (Signed)
Ok for verbals 

## 2016-12-26 NOTE — Telephone Encounter (Signed)
Michele Martin has been informed and expressed understanding.

## 2016-12-26 NOTE — Telephone Encounter (Signed)
Sharyn Lull from [facility] called stating that she completed the home health OT evaluation . She would like to request verbal orders for home health OT one time a week for one week, two times a week for three weeks and one time a week for one week.

## 2016-12-26 NOTE — Telephone Encounter (Signed)
[employer], gave verbal orders.

## 2016-12-26 NOTE — Telephone Encounter (Signed)
1.  Patient was advised to get a BMP and CBC after hospital visit.  Would like to know if Dr. Jenny Reichmann did this on FU? No   2. Patient seen pulmonology yesterday.  Stated chest x ray results would go to Dr. Jenny Reichmann.  Would like to know if he has gotten those results?. They are visible in the chart but I'm not sure if Dr. Jenny Reichmann has had a chance to review them.   3. States pulmonology has suggested Dr. Jenny Reichmann enter a referral for patient to see Cardiology to follow up on fluid around heart from last hospital visit.  Would like to know if Dr.John would refer? If so, patient would like to be referred to Dr. Johnsie Cancel.     4. States patient finished keflex yesterday but patient continues to have burning with urination.  Would like to know if a UA can be done on Monday?   UA has been put in as well as a culture. Home Health's nurse Estill Bamberg will drop off sample to lab on Monday.   ** Dr Jenny Reichmann** Can you please review questions 2 & 3? Estill Bamberg also wanted to know if a BMP and CBC be done also be ordered?

## 2016-12-26 NOTE — Addendum Note (Signed)
Addended by: Biagio Borg on: 12/26/2016 02:47 PM   Modules accepted: Orders

## 2016-12-26 NOTE — Telephone Encounter (Signed)
1.  Patient was advised to get a BMP and CBC after hospital visit.  Would like to know if Dr. Jenny Reichmann did this on FU?  2. Patient seen pulmonology yesterday.  Stated chest x ray results would go to Dr. Jenny Reichmann.  Would like to know if he has gotten those results?  3. States pulmonology has suggested Dr. Jenny Reichmann enter a referral for patient to see Cardiology to follow up on fluid around heart from last hospital visit.  Would like to know if Dr.John would refer? If so, patient would like to be referred to Dr. Johnsie Cancel.    4. States patient finished keflex yesterday but patient continues to have burning with urination.  Would like to know if a UA can be done on Monday?

## 2016-12-26 NOTE — Telephone Encounter (Signed)
I did not feel pt needed follow up labs even though this was suggested by the d/c MD, and still do not, as there were no serious abnormalities with the last labs in the hospital   CXR did not show anything acute, as there was no pneumonia or CHF  OK for the referral to cardiology  OK for Urine study f/u on Mon

## 2016-12-29 ENCOUNTER — Other Ambulatory Visit (INDEPENDENT_AMBULATORY_CARE_PROVIDER_SITE_OTHER): Payer: Medicare Other

## 2016-12-29 DIAGNOSIS — G4733 Obstructive sleep apnea (adult) (pediatric): Secondary | ICD-10-CM | POA: Diagnosis not present

## 2016-12-29 DIAGNOSIS — J441 Chronic obstructive pulmonary disease with (acute) exacerbation: Secondary | ICD-10-CM | POA: Diagnosis not present

## 2016-12-29 DIAGNOSIS — Z87440 Personal history of urinary (tract) infections: Secondary | ICD-10-CM | POA: Diagnosis not present

## 2016-12-29 DIAGNOSIS — R531 Weakness: Secondary | ICD-10-CM | POA: Diagnosis not present

## 2016-12-29 DIAGNOSIS — J45909 Unspecified asthma, uncomplicated: Secondary | ICD-10-CM | POA: Diagnosis not present

## 2016-12-29 DIAGNOSIS — B370 Candidal stomatitis: Secondary | ICD-10-CM | POA: Diagnosis not present

## 2016-12-29 DIAGNOSIS — Z9981 Dependence on supplemental oxygen: Secondary | ICD-10-CM | POA: Diagnosis not present

## 2016-12-29 DIAGNOSIS — N390 Urinary tract infection, site not specified: Secondary | ICD-10-CM

## 2016-12-29 DIAGNOSIS — Z87891 Personal history of nicotine dependence: Secondary | ICD-10-CM | POA: Diagnosis not present

## 2016-12-29 DIAGNOSIS — Z7982 Long term (current) use of aspirin: Secondary | ICD-10-CM | POA: Diagnosis not present

## 2016-12-29 DIAGNOSIS — I5033 Acute on chronic diastolic (congestive) heart failure: Secondary | ICD-10-CM | POA: Diagnosis not present

## 2016-12-29 LAB — URINALYSIS, ROUTINE W REFLEX MICROSCOPIC
BILIRUBIN URINE: NEGATIVE
HGB URINE DIPSTICK: NEGATIVE
KETONES UR: NEGATIVE
NITRITE: NEGATIVE
SPECIFIC GRAVITY, URINE: 1.01 (ref 1.000–1.030)
Total Protein, Urine: NEGATIVE
URINE GLUCOSE: NEGATIVE
UROBILINOGEN UA: 0.2 (ref 0.0–1.0)
pH: 7 (ref 5.0–8.0)

## 2016-12-30 ENCOUNTER — Other Ambulatory Visit: Payer: Self-pay | Admitting: Internal Medicine

## 2016-12-30 ENCOUNTER — Telehealth: Payer: Self-pay

## 2016-12-30 ENCOUNTER — Other Ambulatory Visit: Payer: Self-pay

## 2016-12-30 DIAGNOSIS — Z87440 Personal history of urinary (tract) infections: Secondary | ICD-10-CM | POA: Diagnosis not present

## 2016-12-30 DIAGNOSIS — Z9981 Dependence on supplemental oxygen: Secondary | ICD-10-CM | POA: Diagnosis not present

## 2016-12-30 DIAGNOSIS — I5033 Acute on chronic diastolic (congestive) heart failure: Secondary | ICD-10-CM | POA: Diagnosis not present

## 2016-12-30 DIAGNOSIS — R531 Weakness: Secondary | ICD-10-CM | POA: Diagnosis not present

## 2016-12-30 DIAGNOSIS — J45909 Unspecified asthma, uncomplicated: Secondary | ICD-10-CM | POA: Diagnosis not present

## 2016-12-30 DIAGNOSIS — G4733 Obstructive sleep apnea (adult) (pediatric): Secondary | ICD-10-CM | POA: Diagnosis not present

## 2016-12-30 DIAGNOSIS — Z7982 Long term (current) use of aspirin: Secondary | ICD-10-CM | POA: Diagnosis not present

## 2016-12-30 DIAGNOSIS — J441 Chronic obstructive pulmonary disease with (acute) exacerbation: Secondary | ICD-10-CM | POA: Diagnosis not present

## 2016-12-30 DIAGNOSIS — Z87891 Personal history of nicotine dependence: Secondary | ICD-10-CM | POA: Diagnosis not present

## 2016-12-30 DIAGNOSIS — B370 Candidal stomatitis: Secondary | ICD-10-CM | POA: Diagnosis not present

## 2016-12-30 MED ORDER — NITROFURANTOIN MACROCRYSTAL 50 MG PO CAPS: 50.0000 mg | ORAL_CAPSULE | Freq: Four times a day (QID) | ORAL | 0 refills | Status: DC

## 2016-12-30 NOTE — Telephone Encounter (Signed)
-----   Message from Biagio Borg, MD sent at 12/30/2016  1:18 PM EDT ----- Left message on MyChart, pt to cont same tx except  The test results show that your current treatment is OK, except the urine test does appear to be consistent with possible infection.  The urine culture is pending, but we can start an antibiotic now for this.  I will send a new prescription, and you should hear from the office as well.Redmond Baseman to please inform pt, I will do rx

## 2016-12-30 NOTE — Patient Outreach (Signed)
Transition of care: Attempted x 2 to reach patient for transition of care. Unsuccessful.  PLAN: will continue weekly outreach attempts.  Tomasa Rand, RN, BSN, CEN [facility] [employer] [phone]

## 2016-12-30 NOTE — Telephone Encounter (Signed)
Pt was informed and expressed understanding.

## 2016-12-31 ENCOUNTER — Telehealth: Payer: Self-pay | Admitting: Internal Medicine

## 2016-12-31 DIAGNOSIS — G4733 Obstructive sleep apnea (adult) (pediatric): Secondary | ICD-10-CM | POA: Diagnosis not present

## 2016-12-31 DIAGNOSIS — Z87891 Personal history of nicotine dependence: Secondary | ICD-10-CM | POA: Diagnosis not present

## 2016-12-31 DIAGNOSIS — Z87440 Personal history of urinary (tract) infections: Secondary | ICD-10-CM | POA: Diagnosis not present

## 2016-12-31 DIAGNOSIS — Z9981 Dependence on supplemental oxygen: Secondary | ICD-10-CM | POA: Diagnosis not present

## 2016-12-31 DIAGNOSIS — B370 Candidal stomatitis: Secondary | ICD-10-CM | POA: Diagnosis not present

## 2016-12-31 DIAGNOSIS — J441 Chronic obstructive pulmonary disease with (acute) exacerbation: Secondary | ICD-10-CM | POA: Diagnosis not present

## 2016-12-31 DIAGNOSIS — I5033 Acute on chronic diastolic (congestive) heart failure: Secondary | ICD-10-CM | POA: Diagnosis not present

## 2016-12-31 DIAGNOSIS — R531 Weakness: Secondary | ICD-10-CM | POA: Diagnosis not present

## 2016-12-31 DIAGNOSIS — Z7982 Long term (current) use of aspirin: Secondary | ICD-10-CM | POA: Diagnosis not present

## 2016-12-31 DIAGNOSIS — J45909 Unspecified asthma, uncomplicated: Secondary | ICD-10-CM | POA: Diagnosis not present

## 2016-12-31 LAB — URINE CULTURE

## 2016-12-31 MED ORDER — PROBIOTIC PO TBEC: DELAYED_RELEASE_TABLET | ORAL | 2 refills | Status: DC

## 2016-12-31 NOTE — Telephone Encounter (Signed)
Michele Martin has been informed that script was sent for probiotic.

## 2016-12-31 NOTE — Telephone Encounter (Signed)
Ok done erx 

## 2016-12-31 NOTE — Telephone Encounter (Signed)
Would like to know if the Pt should be on a Probiotic, she has been on numerous antibiotics lately and is just concerned about this.

## 2017-01-01 ENCOUNTER — Other Ambulatory Visit: Payer: Self-pay

## 2017-01-01 DIAGNOSIS — I5033 Acute on chronic diastolic (congestive) heart failure: Secondary | ICD-10-CM | POA: Diagnosis not present

## 2017-01-01 DIAGNOSIS — Z7982 Long term (current) use of aspirin: Secondary | ICD-10-CM | POA: Diagnosis not present

## 2017-01-01 DIAGNOSIS — R531 Weakness: Secondary | ICD-10-CM | POA: Diagnosis not present

## 2017-01-01 DIAGNOSIS — Z87891 Personal history of nicotine dependence: Secondary | ICD-10-CM | POA: Diagnosis not present

## 2017-01-01 DIAGNOSIS — Z9981 Dependence on supplemental oxygen: Secondary | ICD-10-CM | POA: Diagnosis not present

## 2017-01-01 DIAGNOSIS — G4733 Obstructive sleep apnea (adult) (pediatric): Secondary | ICD-10-CM | POA: Diagnosis not present

## 2017-01-01 DIAGNOSIS — Z87440 Personal history of urinary (tract) infections: Secondary | ICD-10-CM | POA: Diagnosis not present

## 2017-01-01 DIAGNOSIS — J441 Chronic obstructive pulmonary disease with (acute) exacerbation: Secondary | ICD-10-CM | POA: Diagnosis not present

## 2017-01-01 DIAGNOSIS — B370 Candidal stomatitis: Secondary | ICD-10-CM | POA: Diagnosis not present

## 2017-01-01 DIAGNOSIS — J45909 Unspecified asthma, uncomplicated: Secondary | ICD-10-CM | POA: Diagnosis not present

## 2017-01-01 NOTE — Patient Outreach (Signed)
Transition of care: Vitals:   01/01/17 1333  Weight: 101 lb (45.8 kg)    Placed call to patient who reports that she is doing well. States that she no longer has dysuria. Reports that she is continuing to take her antibiotic.  Reports that she is working well with home health physical therapy. Reports that she feels stronger this week.   No shortness of breath and no swelling.  Denies any new concerns today.  PLAN: patient will continue to be outreached weekly for transition of care. Reviewed with patient a covering case manager would contact her next week.   Tomasa Rand, RN, BSN, CEN [facility] [employer] [phone]

## 2017-01-02 DIAGNOSIS — G4733 Obstructive sleep apnea (adult) (pediatric): Secondary | ICD-10-CM | POA: Diagnosis not present

## 2017-01-02 DIAGNOSIS — B370 Candidal stomatitis: Secondary | ICD-10-CM | POA: Diagnosis not present

## 2017-01-02 DIAGNOSIS — Z9981 Dependence on supplemental oxygen: Secondary | ICD-10-CM | POA: Diagnosis not present

## 2017-01-02 DIAGNOSIS — I5033 Acute on chronic diastolic (congestive) heart failure: Secondary | ICD-10-CM | POA: Diagnosis not present

## 2017-01-02 DIAGNOSIS — Z87440 Personal history of urinary (tract) infections: Secondary | ICD-10-CM | POA: Diagnosis not present

## 2017-01-02 DIAGNOSIS — J441 Chronic obstructive pulmonary disease with (acute) exacerbation: Secondary | ICD-10-CM | POA: Diagnosis not present

## 2017-01-02 DIAGNOSIS — J45909 Unspecified asthma, uncomplicated: Secondary | ICD-10-CM | POA: Diagnosis not present

## 2017-01-02 DIAGNOSIS — R531 Weakness: Secondary | ICD-10-CM | POA: Diagnosis not present

## 2017-01-02 DIAGNOSIS — Z7982 Long term (current) use of aspirin: Secondary | ICD-10-CM | POA: Diagnosis not present

## 2017-01-02 DIAGNOSIS — Z87891 Personal history of nicotine dependence: Secondary | ICD-10-CM | POA: Diagnosis not present

## 2017-01-05 DIAGNOSIS — G4733 Obstructive sleep apnea (adult) (pediatric): Secondary | ICD-10-CM | POA: Diagnosis not present

## 2017-01-05 DIAGNOSIS — J45909 Unspecified asthma, uncomplicated: Secondary | ICD-10-CM | POA: Diagnosis not present

## 2017-01-05 DIAGNOSIS — I5033 Acute on chronic diastolic (congestive) heart failure: Secondary | ICD-10-CM | POA: Diagnosis not present

## 2017-01-05 DIAGNOSIS — B370 Candidal stomatitis: Secondary | ICD-10-CM | POA: Diagnosis not present

## 2017-01-05 DIAGNOSIS — Z87891 Personal history of nicotine dependence: Secondary | ICD-10-CM | POA: Diagnosis not present

## 2017-01-05 DIAGNOSIS — J441 Chronic obstructive pulmonary disease with (acute) exacerbation: Secondary | ICD-10-CM | POA: Diagnosis not present

## 2017-01-05 DIAGNOSIS — Z87440 Personal history of urinary (tract) infections: Secondary | ICD-10-CM | POA: Diagnosis not present

## 2017-01-05 DIAGNOSIS — Z9981 Dependence on supplemental oxygen: Secondary | ICD-10-CM | POA: Diagnosis not present

## 2017-01-05 DIAGNOSIS — Z7982 Long term (current) use of aspirin: Secondary | ICD-10-CM | POA: Diagnosis not present

## 2017-01-05 DIAGNOSIS — R531 Weakness: Secondary | ICD-10-CM | POA: Diagnosis not present

## 2017-01-06 ENCOUNTER — Telehealth: Payer: Self-pay | Admitting: Internal Medicine

## 2017-01-06 DIAGNOSIS — B370 Candidal stomatitis: Secondary | ICD-10-CM | POA: Diagnosis not present

## 2017-01-06 DIAGNOSIS — I5033 Acute on chronic diastolic (congestive) heart failure: Secondary | ICD-10-CM | POA: Diagnosis not present

## 2017-01-06 DIAGNOSIS — Z7982 Long term (current) use of aspirin: Secondary | ICD-10-CM | POA: Diagnosis not present

## 2017-01-06 DIAGNOSIS — Z87440 Personal history of urinary (tract) infections: Secondary | ICD-10-CM | POA: Diagnosis not present

## 2017-01-06 DIAGNOSIS — Z9981 Dependence on supplemental oxygen: Secondary | ICD-10-CM | POA: Diagnosis not present

## 2017-01-06 DIAGNOSIS — Z87891 Personal history of nicotine dependence: Secondary | ICD-10-CM | POA: Diagnosis not present

## 2017-01-06 DIAGNOSIS — G4733 Obstructive sleep apnea (adult) (pediatric): Secondary | ICD-10-CM | POA: Diagnosis not present

## 2017-01-06 DIAGNOSIS — J441 Chronic obstructive pulmonary disease with (acute) exacerbation: Secondary | ICD-10-CM | POA: Diagnosis not present

## 2017-01-06 DIAGNOSIS — J45909 Unspecified asthma, uncomplicated: Secondary | ICD-10-CM | POA: Diagnosis not present

## 2017-01-06 DIAGNOSIS — R531 Weakness: Secondary | ICD-10-CM | POA: Diagnosis not present

## 2017-01-06 NOTE — Telephone Encounter (Signed)
Pt has had a weight increase 3lbs since saturday and 5lbs since last  Tuesday

## 2017-01-07 DIAGNOSIS — J441 Chronic obstructive pulmonary disease with (acute) exacerbation: Secondary | ICD-10-CM | POA: Diagnosis not present

## 2017-01-07 DIAGNOSIS — R531 Weakness: Secondary | ICD-10-CM | POA: Diagnosis not present

## 2017-01-07 DIAGNOSIS — Z87891 Personal history of nicotine dependence: Secondary | ICD-10-CM | POA: Diagnosis not present

## 2017-01-07 DIAGNOSIS — I5033 Acute on chronic diastolic (congestive) heart failure: Secondary | ICD-10-CM | POA: Diagnosis not present

## 2017-01-07 DIAGNOSIS — J45909 Unspecified asthma, uncomplicated: Secondary | ICD-10-CM | POA: Diagnosis not present

## 2017-01-07 DIAGNOSIS — B370 Candidal stomatitis: Secondary | ICD-10-CM | POA: Diagnosis not present

## 2017-01-07 DIAGNOSIS — Z87440 Personal history of urinary (tract) infections: Secondary | ICD-10-CM | POA: Diagnosis not present

## 2017-01-07 DIAGNOSIS — G4733 Obstructive sleep apnea (adult) (pediatric): Secondary | ICD-10-CM | POA: Diagnosis not present

## 2017-01-07 DIAGNOSIS — Z7982 Long term (current) use of aspirin: Secondary | ICD-10-CM | POA: Diagnosis not present

## 2017-01-07 DIAGNOSIS — Z9981 Dependence on supplemental oxygen: Secondary | ICD-10-CM | POA: Diagnosis not present

## 2017-01-07 NOTE — Telephone Encounter (Signed)
Left detailed msg for [facility]) stating the below info per PCP.

## 2017-01-07 NOTE — Telephone Encounter (Signed)
I believe the pt is taking HALF of lasix 40 qam  OK to increase to 40 mg per day, with contd daily wts, but will need ROV in 1 wk, or sooner if cont's to gain wt

## 2017-01-08 ENCOUNTER — Other Ambulatory Visit: Payer: Self-pay | Admitting: *Deleted

## 2017-01-08 ENCOUNTER — Ambulatory Visit: Payer: Self-pay

## 2017-01-08 DIAGNOSIS — J441 Chronic obstructive pulmonary disease with (acute) exacerbation: Secondary | ICD-10-CM | POA: Diagnosis not present

## 2017-01-08 DIAGNOSIS — R531 Weakness: Secondary | ICD-10-CM | POA: Diagnosis not present

## 2017-01-08 DIAGNOSIS — Z7982 Long term (current) use of aspirin: Secondary | ICD-10-CM | POA: Diagnosis not present

## 2017-01-08 DIAGNOSIS — J45909 Unspecified asthma, uncomplicated: Secondary | ICD-10-CM | POA: Diagnosis not present

## 2017-01-08 DIAGNOSIS — B370 Candidal stomatitis: Secondary | ICD-10-CM | POA: Diagnosis not present

## 2017-01-08 DIAGNOSIS — G4733 Obstructive sleep apnea (adult) (pediatric): Secondary | ICD-10-CM | POA: Diagnosis not present

## 2017-01-08 DIAGNOSIS — I5033 Acute on chronic diastolic (congestive) heart failure: Secondary | ICD-10-CM | POA: Diagnosis not present

## 2017-01-08 DIAGNOSIS — Z87440 Personal history of urinary (tract) infections: Secondary | ICD-10-CM | POA: Diagnosis not present

## 2017-01-08 DIAGNOSIS — Z87891 Personal history of nicotine dependence: Secondary | ICD-10-CM | POA: Diagnosis not present

## 2017-01-08 DIAGNOSIS — Z9981 Dependence on supplemental oxygen: Secondary | ICD-10-CM | POA: Diagnosis not present

## 2017-01-08 NOTE — Patient Outreach (Signed)
[facility]) Care Management  01/08/2017  Michele Martin 1945-10-18 [id number]   RN covering for Tomasa Rand, RN case manager for transition of care call  RN spoke with pt today and introduced the purpose for today's call. Identifiers confirmed as pt indicated she is "doing well" with no complaints or needs to address today. RN inquired on her HF and confirmed weights for yesterday 101 lbs, today 103 lbs and last week ranges indicated 101-104 lbs as pt remains asymptomatic with no swelling or issues with her breathing. Pt aware of the HF action zone and what to do if acute symptoms should occur. Confirmed pt has been adherent with all her medications and attending all medical appointments with no encountered issues. Will continue to encourage her on management of care and continue to weigh daily for ongoing HF monitoring. RN will update the active case manager on this case Tomasa Rand, RN.  Will continue ongoing weekly transition of care accordingly.  Raina Mina, RN Care Management Coordinator [place] [facility] [phone]

## 2017-01-09 DIAGNOSIS — Z7982 Long term (current) use of aspirin: Secondary | ICD-10-CM | POA: Diagnosis not present

## 2017-01-09 DIAGNOSIS — R531 Weakness: Secondary | ICD-10-CM | POA: Diagnosis not present

## 2017-01-09 DIAGNOSIS — G4733 Obstructive sleep apnea (adult) (pediatric): Secondary | ICD-10-CM | POA: Diagnosis not present

## 2017-01-09 DIAGNOSIS — Z87440 Personal history of urinary (tract) infections: Secondary | ICD-10-CM | POA: Diagnosis not present

## 2017-01-09 DIAGNOSIS — I5033 Acute on chronic diastolic (congestive) heart failure: Secondary | ICD-10-CM | POA: Diagnosis not present

## 2017-01-09 DIAGNOSIS — Z87891 Personal history of nicotine dependence: Secondary | ICD-10-CM | POA: Diagnosis not present

## 2017-01-09 DIAGNOSIS — J45909 Unspecified asthma, uncomplicated: Secondary | ICD-10-CM | POA: Diagnosis not present

## 2017-01-09 DIAGNOSIS — Z9981 Dependence on supplemental oxygen: Secondary | ICD-10-CM | POA: Diagnosis not present

## 2017-01-09 DIAGNOSIS — B370 Candidal stomatitis: Secondary | ICD-10-CM | POA: Diagnosis not present

## 2017-01-09 DIAGNOSIS — J441 Chronic obstructive pulmonary disease with (acute) exacerbation: Secondary | ICD-10-CM | POA: Diagnosis not present

## 2017-01-11 DIAGNOSIS — Z7982 Long term (current) use of aspirin: Secondary | ICD-10-CM | POA: Diagnosis not present

## 2017-01-11 DIAGNOSIS — J441 Chronic obstructive pulmonary disease with (acute) exacerbation: Secondary | ICD-10-CM | POA: Diagnosis not present

## 2017-01-11 DIAGNOSIS — I5033 Acute on chronic diastolic (congestive) heart failure: Secondary | ICD-10-CM | POA: Diagnosis not present

## 2017-01-11 DIAGNOSIS — G4733 Obstructive sleep apnea (adult) (pediatric): Secondary | ICD-10-CM | POA: Diagnosis not present

## 2017-01-11 DIAGNOSIS — Z9981 Dependence on supplemental oxygen: Secondary | ICD-10-CM | POA: Diagnosis not present

## 2017-01-11 DIAGNOSIS — B370 Candidal stomatitis: Secondary | ICD-10-CM | POA: Diagnosis not present

## 2017-01-11 DIAGNOSIS — J45909 Unspecified asthma, uncomplicated: Secondary | ICD-10-CM | POA: Diagnosis not present

## 2017-01-11 DIAGNOSIS — Z87891 Personal history of nicotine dependence: Secondary | ICD-10-CM | POA: Diagnosis not present

## 2017-01-11 DIAGNOSIS — Z87440 Personal history of urinary (tract) infections: Secondary | ICD-10-CM | POA: Diagnosis not present

## 2017-01-11 DIAGNOSIS — R531 Weakness: Secondary | ICD-10-CM | POA: Diagnosis not present

## 2017-01-12 ENCOUNTER — Ambulatory Visit: Payer: Medicare Other | Admitting: Internal Medicine

## 2017-01-12 DIAGNOSIS — Z87891 Personal history of nicotine dependence: Secondary | ICD-10-CM | POA: Diagnosis not present

## 2017-01-12 DIAGNOSIS — J441 Chronic obstructive pulmonary disease with (acute) exacerbation: Secondary | ICD-10-CM | POA: Diagnosis not present

## 2017-01-12 DIAGNOSIS — Z9981 Dependence on supplemental oxygen: Secondary | ICD-10-CM | POA: Diagnosis not present

## 2017-01-12 DIAGNOSIS — G4733 Obstructive sleep apnea (adult) (pediatric): Secondary | ICD-10-CM | POA: Diagnosis not present

## 2017-01-12 DIAGNOSIS — Z7982 Long term (current) use of aspirin: Secondary | ICD-10-CM | POA: Diagnosis not present

## 2017-01-12 DIAGNOSIS — I5033 Acute on chronic diastolic (congestive) heart failure: Secondary | ICD-10-CM | POA: Diagnosis not present

## 2017-01-12 DIAGNOSIS — J45909 Unspecified asthma, uncomplicated: Secondary | ICD-10-CM | POA: Diagnosis not present

## 2017-01-12 DIAGNOSIS — Z87440 Personal history of urinary (tract) infections: Secondary | ICD-10-CM | POA: Diagnosis not present

## 2017-01-12 DIAGNOSIS — R531 Weakness: Secondary | ICD-10-CM | POA: Diagnosis not present

## 2017-01-12 DIAGNOSIS — B370 Candidal stomatitis: Secondary | ICD-10-CM | POA: Diagnosis not present

## 2017-01-13 ENCOUNTER — Encounter: Payer: Self-pay | Admitting: Internal Medicine

## 2017-01-13 ENCOUNTER — Other Ambulatory Visit (INDEPENDENT_AMBULATORY_CARE_PROVIDER_SITE_OTHER): Payer: Medicare Other

## 2017-01-13 ENCOUNTER — Ambulatory Visit (INDEPENDENT_AMBULATORY_CARE_PROVIDER_SITE_OTHER)
Admission: RE | Admit: 2017-01-13 | Discharge: 2017-01-13 | Disposition: A | Discharge status: Home / Self Care | Payer: Medicare Other | Source: Ambulatory Visit | Attending: Internal Medicine | Admitting: Internal Medicine

## 2017-01-13 ENCOUNTER — Ambulatory Visit (INDEPENDENT_AMBULATORY_CARE_PROVIDER_SITE_OTHER): Payer: Medicare Other | Admitting: Internal Medicine

## 2017-01-13 VITALS — BP 108/60 | HR 110 | Ht 61.0 in | Wt 103.0 lb

## 2017-01-13 DIAGNOSIS — G4733 Obstructive sleep apnea (adult) (pediatric): Secondary | ICD-10-CM | POA: Diagnosis not present

## 2017-01-13 DIAGNOSIS — J441 Chronic obstructive pulmonary disease with (acute) exacerbation: Secondary | ICD-10-CM | POA: Diagnosis not present

## 2017-01-13 DIAGNOSIS — Z7982 Long term (current) use of aspirin: Secondary | ICD-10-CM | POA: Diagnosis not present

## 2017-01-13 DIAGNOSIS — J45909 Unspecified asthma, uncomplicated: Secondary | ICD-10-CM | POA: Diagnosis not present

## 2017-01-13 DIAGNOSIS — J449 Chronic obstructive pulmonary disease, unspecified: Secondary | ICD-10-CM

## 2017-01-13 DIAGNOSIS — J9612 Chronic respiratory failure with hypercapnia: Secondary | ICD-10-CM

## 2017-01-13 DIAGNOSIS — Z9981 Dependence on supplemental oxygen: Secondary | ICD-10-CM | POA: Diagnosis not present

## 2017-01-13 DIAGNOSIS — Z87891 Personal history of nicotine dependence: Secondary | ICD-10-CM | POA: Diagnosis not present

## 2017-01-13 DIAGNOSIS — I5030 Unspecified diastolic (congestive) heart failure: Secondary | ICD-10-CM

## 2017-01-13 DIAGNOSIS — I5032 Chronic diastolic (congestive) heart failure: Secondary | ICD-10-CM | POA: Insufficient documentation

## 2017-01-13 DIAGNOSIS — R079 Chest pain, unspecified: Secondary | ICD-10-CM

## 2017-01-13 DIAGNOSIS — I5033 Acute on chronic diastolic (congestive) heart failure: Secondary | ICD-10-CM | POA: Diagnosis not present

## 2017-01-13 DIAGNOSIS — Z87440 Personal history of urinary (tract) infections: Secondary | ICD-10-CM | POA: Diagnosis not present

## 2017-01-13 DIAGNOSIS — J9611 Chronic respiratory failure with hypoxia: Secondary | ICD-10-CM

## 2017-01-13 DIAGNOSIS — R531 Weakness: Secondary | ICD-10-CM | POA: Diagnosis not present

## 2017-01-13 DIAGNOSIS — B370 Candidal stomatitis: Secondary | ICD-10-CM | POA: Diagnosis not present

## 2017-01-13 MED ORDER — FUROSEMIDE 40 MG PO TABS: 40.0000 mg | ORAL_TABLET | Freq: Every day | ORAL | 3 refills | Status: DC

## 2017-01-13 NOTE — Assessment & Plan Note (Signed)
Pt was not wearing o2 initialy on coming in with low sat, but improved back on usual home o2; exam o/w benign, ok to follow

## 2017-01-13 NOTE — Assessment & Plan Note (Signed)
Overall stable with adequate sats on o2 [place]; ok to continue current tx

## 2017-01-13 NOTE — Assessment & Plan Note (Signed)
Appears euvolemic today on exam, for BNP and BMP, but to cont lasix 40 qd only, f/u next visit, to return for any wt increase over 5 lbs at home

## 2017-01-13 NOTE — Assessment & Plan Note (Signed)
Atypical, ecg reviewed, ok for cxr but doubt cardiac, PE or infectious related, ok to cont to monitor

## 2017-01-13 NOTE — Patient Instructions (Signed)
Your EKG was OK today  Please continue all other medications as before, including the lasix at 40 mg per day  Please have the pharmacy call with any other refills you may need.  Please continue your efforts at being more active, low cholesterol diet, and weight control.  Please keep your appointments with your specialists as you may have planned - Dr Johnsie Cancel on July 9  Please go to the XRAY Department in the Basement (go straight as you get off the elevator) for the x-ray testing  Please go to the LAB in the Basement (turn left off the elevator) for the tests to be done today  You will be contacted by phone if any changes need to be made immediately.  Otherwise, you will receive a letter about your results with an explanation, but please check with MyChart first.  Please remember to sign up for MyChart if you have not done so, as this will be important to you in the future with finding out test results, communicating by private email, and scheduling acute appointments online when needed.  Please return in 2 months

## 2017-01-13 NOTE — Progress Notes (Addendum)
Subjective:    Patient ID: Michele Martin, female    DOB: June 17, 1946, 71 y.o.   MRN: [id number]  HPI  Here to f/u after visited by [facility] RN 2 days ago, where pt had mentioned CP and sob.  CP is mild, intermittent, sharp and dull sometimes, fleeting for seconds, may be pleuritic but not sure, and is nonexertional and nonpositional, without radiation, assoc with n/v, palp or diaphoresis.  Pt denies fever, ST, worsening sob or wheezing.  [facility] RN asked her to take a second dose of lasix 40 in the PM of 6/17 which she did, then only in AM yesterday.  Pt is here as [facility] RN suggested she might need f/u CXR.  Pt denies  orthopnea, PND, increased LE swelling, palpitations, dizziness or syncope.  Most recent echo dec 2017 with normal EF and mild diast dysfxn.  Wt may be up 2 lbs since last done (different scales) Wt Readings from Last 3 Encounters:  01/13/17 103 lb (46.7 kg)  01/01/17 101 lb (45.8 kg)  12/25/16 100 lb 12.8 oz (45.7 kg)   BP Readings from Last 3 Encounters:  01/13/17 108/60  12/25/16 114/64  12/18/16 (!) 102/50   Past Medical History:  Diagnosis Date  . Acute angle-closure glaucoma 02/14/2010  . ALLERGIC RHINITIS 09/19/2009  . ANXIETY 04/15/2010  . Arthritis   . ASTHMA 09/19/2009  . BACK PAIN 02/14/2010  . Chronic bronchitis 04/14/2011  . CHRONIC OBSTRUCTIVE PULMONARY DISEASE, ACUTE EXACERBATION 12/19/2009  . Complication of anesthesia   . COPD 06/27/2009  . DEPRESSION 09/19/2009  . HOARSENESS 10/17/2009  . HYPERLIPIDEMIA 09/19/2009  . MENOPAUSAL DISORDER 08/20/2010  . OSTEOPENIA 09/10/2010  . Osteoporosis, unspecified 04/20/2014  . Rotator cuff tear 06/23/2011  . TINNITUS 09/10/2010  . TREMOR 02/14/2010  . Tremor 07/30/2011  . URI 09/10/2010  . Wheezing 06/27/2009   Past Surgical History:  Procedure Laterality Date  . ABDOMINAL HYSTERECTOMY    . cervical fusion 2013 - Dr Vertell Limber    . Normal Heart Cath  2001   Dr. Johnsie Cancel  . s/p laser tx for glaucoma     no vision loss    reports that  she has quit smoking. Her smoking use included Cigarettes. She started smoking about 18 months ago. She has a 25.00 pack-year smoking history. She has never used smokeless tobacco. She reports that she does not drink alcohol or use drugs. family history includes Dementia in her mother; Rheum arthritis in her maternal uncle. Allergies  Allergen Reactions  . Pravastatin Itching and Other (See Comments)    Reaction:  Muscle cramps   . Simvastatin Itching and Other (See Comments)    Reaction:  Muscle cramps   . Statins Itching and Other (See Comments)    Reaction:  Muscle cramps   Current Outpatient Prescriptions on File Prior to Visit  Medication Sig Dispense Refill  . albuterol (PROVENTIL HFA) 108 (90 Base) MCG/ACT inhaler Inhale 2 puffs into the lungs every 6 (six) hours as needed for wheezing or shortness of breath. 18 g 5  . albuterol (PROVENTIL) (2.5 MG/3ML) 0.083% nebulizer solution Take 3 mLs (2.5 mg total) by nebulization every 6 (six) hours as needed for wheezing or shortness of breath. 150 mL 1  . aspirin EC 81 MG tablet Take 81 mg by mouth at bedtime.    [name] atorvastatin (LIPITOR) 20 MG tablet TAKE 1 TABLET BY MOUTH ONCE DAILY (Patient taking differently: Take 1 tablet by mouth at bedtime) 90 tablet 3  . b  complex vitamins tablet Take 1 tablet by mouth at bedtime.    . budesonide-formoterol (SYMBICORT) 160-4.5 MCG/ACT inhaler Inhale 2 puffs into the lungs 2 (two) times daily. 3 Inhaler 3  . clonazePAM (KLONOPIN) 0.5 MG tablet Take 1 tablet (0.5 mg total) by mouth 2 (two) times daily as needed for anxiety. 60 tablet 5  . dextromethorphan-guaiFENesin (MUCINEX DM) 30-600 MG 12hr tablet Take 2 tablets by mouth 2 (two) times daily. 60 tablet 0  . donepezil (ARICEPT) 10 MG tablet Take 1 tablet (10 mg total) by mouth at bedtime.    [name] escitalopram (LEXAPRO) 20 MG tablet Take 0.5 tablets (10 mg total) by mouth daily. 45 tablet 3  . gabapentin (NEURONTIN) 100 MG capsule Take 100 mg by mouth 3  (three) times daily.    . montelukast (SINGULAIR) 10 MG tablet TAKE 1 TABLET BY MOUTH EVERY NIGHT AT BEDTIME 30 tablet 11  . predniSONE (DELTASONE) 10 MG tablet Take 4 tablets for 3 days; Take 3 tablets for 4 days; Take 2 tablets for 3 days; Take 1 tablet for 4 days 34 tablet 0  . Probiotic TBEC 1 by mouth per day 30 tablet 2  . tiotropium (SPIRIVA HANDIHALER) 18 MCG inhalation capsule Place 1 capsule (18 mcg total) into inhaler and inhale daily. 90 capsule 3   No current facility-administered medications on file prior to visit.    Review of Systems  Constitutional: Negative for other unusual diaphoresis or sweats HENT: Negative for ear discharge or swelling Eyes: Negative for other worsening visual disturbances Respiratory: Negative for stridor or other swelling  Gastrointestinal: Negative for worsening distension or other blood Genitourinary: Negative for retention or other urinary change Musculoskeletal: Negative for other MSK pain or swelling Skin: Negative for color change or other new lesions Neurological: Negative for worsening tremors and other numbness  Psychiatric/Behavioral: Negative for worsening agitation or other fatigue All other system neg per pt    Objective:   Physical Exam BP 108/60   Pulse (!) 110   Ht 5\' 1"  (1.549 m)   Wt 103 lb (46.7 kg)   SpO2 (!) 85%   BMI 19.46 kg/m  with exertion on RA accidentally (o2 accidentally turned off) - sat to 95% at rest VS noted, not acutely ill appearing Constitutional: Pt appears in NAD HENT: Head: NCAT.  Right Ear: External ear normal.  Left Ear: External ear normal.  Eyes: . Pupils are equal, round, and reactive to light. Conjunctivae and EOM are normal Nose: without d/c or deformity Neck: Neck supple. Gross normal ROM Cardiovascular: Normal rate and regular rhythm.   Pulmonary/Chest: Effort normal and breath sounds marked decreased but without rales or wheezing. appreciated Abd:  Soft, NT, ND, + BS, no  organomegaly Neurological: Pt is alert. At baseline orientation, motor grossly intact Skin: Skin is warm. No rashes, other new lesions, no LE edema Psychiatric: Pt behavior is normal without agitation  No other exam findings  Lab Results  Component Value Date   WBC 9.1 12/11/2016   HGB 12.6 12/11/2016   HCT 40.2 12/11/2016   PLT 181 12/11/2016   GLUCOSE 118 (H) 12/12/2016   CHOL 222 (H) 08/09/2015   TRIG 95.0 08/09/2015   HDL 89.50 08/09/2015   LDLDIRECT 164.4 07/19/2013   LDLCALC 114 (H) 08/09/2015   ALT 26 12/05/2016   AST 34 12/05/2016   NA 141 12/12/2016   K 3.7 12/12/2016   CL 92 (L) 12/12/2016   CREATININE 0.36 (L) 12/12/2016   BUN 29 (H) 12/12/2016  CO2 37 (H) 12/12/2016   TSH 0.55 10/28/2016   HGBA1C 5.6 08/01/2011   ECG today I have personally interpreted Sinus tachycardia 103, no acute ST or T wave changes    Assessment & Plan:

## 2017-01-14 ENCOUNTER — Telehealth: Payer: Self-pay | Admitting: Internal Medicine

## 2017-01-14 ENCOUNTER — Telehealth: Payer: Self-pay

## 2017-01-14 ENCOUNTER — Other Ambulatory Visit: Payer: Self-pay | Admitting: Internal Medicine

## 2017-01-14 DIAGNOSIS — Z7982 Long term (current) use of aspirin: Secondary | ICD-10-CM | POA: Diagnosis not present

## 2017-01-14 DIAGNOSIS — Z87440 Personal history of urinary (tract) infections: Secondary | ICD-10-CM | POA: Diagnosis not present

## 2017-01-14 DIAGNOSIS — G4733 Obstructive sleep apnea (adult) (pediatric): Secondary | ICD-10-CM | POA: Diagnosis not present

## 2017-01-14 DIAGNOSIS — Z9981 Dependence on supplemental oxygen: Secondary | ICD-10-CM | POA: Diagnosis not present

## 2017-01-14 DIAGNOSIS — R531 Weakness: Secondary | ICD-10-CM | POA: Diagnosis not present

## 2017-01-14 DIAGNOSIS — Z87891 Personal history of nicotine dependence: Secondary | ICD-10-CM | POA: Diagnosis not present

## 2017-01-14 DIAGNOSIS — I5033 Acute on chronic diastolic (congestive) heart failure: Secondary | ICD-10-CM | POA: Diagnosis not present

## 2017-01-14 DIAGNOSIS — J45909 Unspecified asthma, uncomplicated: Secondary | ICD-10-CM | POA: Diagnosis not present

## 2017-01-14 DIAGNOSIS — J441 Chronic obstructive pulmonary disease with (acute) exacerbation: Secondary | ICD-10-CM | POA: Diagnosis not present

## 2017-01-14 DIAGNOSIS — B370 Candidal stomatitis: Secondary | ICD-10-CM | POA: Diagnosis not present

## 2017-01-14 LAB — BASIC METABOLIC PANEL
BUN: 15 mg/dL (ref 6–23)
CHLORIDE: 89 meq/L — AB (ref 96–112)
CO2: 44 mEq/L — ABNORMAL HIGH (ref 19–32)
Calcium: 9.7 mg/dL (ref 8.4–10.5)
Creatinine, Ser: 0.72 mg/dL (ref 0.40–1.20)
GFR: 84.54 mL/min (ref >60.00)
GLUCOSE: 132 mg/dL — AB (ref 70–99)
POTASSIUM: 3.3 meq/L — AB (ref 3.5–5.1)
Sodium: 141 mEq/L (ref 135–145)

## 2017-01-14 LAB — BRAIN NATRIURETIC PEPTIDE: Pro B Natriuretic peptide (BNP): 42 pg/mL (ref 0.0–100.0)

## 2017-01-14 MED ORDER — POTASSIUM CHLORIDE ER 10 MEQ PO TBCR: 10.0000 meq | EXTENDED_RELEASE_TABLET | Freq: Every day | ORAL | 3 refills | Status: DC

## 2017-01-14 NOTE — Telephone Encounter (Signed)
Pt pharmacy has changed to [facility], please update chart

## 2017-01-14 NOTE — Telephone Encounter (Signed)
Ok for verbals 

## 2017-01-14 NOTE — Telephone Encounter (Signed)
Done

## 2017-01-14 NOTE — Telephone Encounter (Signed)
-----   Message from Biagio Borg, MD sent at 01/14/2017  1:16 PM EDT ----- Left message on MyChart, pt to cont same tx except  The test results show that your current treatment is OK, except the Potassium Is ow, most likely due to the lasix  Please start a potassium pill on the days you take the lasix  A new prescription will be sent, and you should be called from the office as well.  Redmond Baseman to please inform pt, also the CXR was negative for pna

## 2017-01-14 NOTE — Telephone Encounter (Signed)
Mervyn Gay the verbal orders to extend nursing.

## 2017-01-14 NOTE — Telephone Encounter (Signed)
Michele Martin home health. Number - [phone]  Need verbal to extend nursing services 1 week 1 2 week 2 1 week 2

## 2017-01-14 NOTE — Telephone Encounter (Signed)
Pt has been informed and expressed understanding.  

## 2017-01-15 ENCOUNTER — Other Ambulatory Visit: Payer: Self-pay

## 2017-01-15 ENCOUNTER — Telehealth: Payer: Self-pay | Admitting: Pulmonary Disease

## 2017-01-15 ENCOUNTER — Ambulatory Visit: Payer: Medicare Other

## 2017-01-15 ENCOUNTER — Encounter: Payer: Self-pay | Admitting: Cardiovascular Disease

## 2017-01-15 DIAGNOSIS — Z87440 Personal history of urinary (tract) infections: Secondary | ICD-10-CM | POA: Diagnosis not present

## 2017-01-15 DIAGNOSIS — Z87891 Personal history of nicotine dependence: Secondary | ICD-10-CM | POA: Diagnosis not present

## 2017-01-15 DIAGNOSIS — R531 Weakness: Secondary | ICD-10-CM | POA: Diagnosis not present

## 2017-01-15 DIAGNOSIS — Z9981 Dependence on supplemental oxygen: Secondary | ICD-10-CM | POA: Diagnosis not present

## 2017-01-15 DIAGNOSIS — G4733 Obstructive sleep apnea (adult) (pediatric): Secondary | ICD-10-CM | POA: Diagnosis not present

## 2017-01-15 DIAGNOSIS — I5033 Acute on chronic diastolic (congestive) heart failure: Secondary | ICD-10-CM | POA: Diagnosis not present

## 2017-01-15 DIAGNOSIS — B370 Candidal stomatitis: Secondary | ICD-10-CM | POA: Diagnosis not present

## 2017-01-15 DIAGNOSIS — J441 Chronic obstructive pulmonary disease with (acute) exacerbation: Secondary | ICD-10-CM | POA: Diagnosis not present

## 2017-01-15 DIAGNOSIS — J45909 Unspecified asthma, uncomplicated: Secondary | ICD-10-CM | POA: Diagnosis not present

## 2017-01-15 DIAGNOSIS — Z7982 Long term (current) use of aspirin: Secondary | ICD-10-CM | POA: Diagnosis not present

## 2017-01-15 DIAGNOSIS — J449 Chronic obstructive pulmonary disease, unspecified: Secondary | ICD-10-CM

## 2017-01-15 NOTE — Patient Outreach (Signed)
Transition of care: Vitals:   01/15/17 1438  Weight: 105 lb (47.6 kg)   Placed call to patient who reports that she is doing well. States that she is feeling stronger. States that she continues to work with home health PT.  Reports no swelling or shortness of breath. Reviewed recent weights ranging from 102-105 pounds.   Patient reports overall she is feeling better.  PLAN:  Reviewed with patient that she had completed 30 day transition of care program without a readmission.  Reviewed with patient that I would call her again in 30 days for follow up. Encouraged patient to call me she has had an question or problems. Patient agreed.  Michele Rand, RN, BSN, CEN [facility] [employer] [phone]

## 2017-01-15 NOTE — Telephone Encounter (Signed)
Called spoke with patient's granddaughter Amber who reports pt's neb machine is broken and not working at all.  With the heat and humidity pt is needing to use her albuterol nebulizer but is unable to.  Amber requesting STAT order be sent to [facility] for new neb machine - this has been done.  Also called pt's nurse Estill Bamberg at [facility] to let her know this has been done >> left detailed message on [name]'s named VM informing her order has been placed and [facility] should contact pt to set up delivery time.  Nothing further needed at this time; will sign off.

## 2017-01-15 NOTE — Telephone Encounter (Signed)
Estill Bamberg - nurse from [facility] called regarding broken nebulizer - pt recently discharged from hospital for pneumonia and pt suffering with nebulizer - needs order sent to [facility] asap. Estill Bamberg can be reached at [phone] -pr

## 2017-01-16 ENCOUNTER — Other Ambulatory Visit: Payer: Self-pay | Admitting: Internal Medicine

## 2017-01-18 DIAGNOSIS — Z7982 Long term (current) use of aspirin: Secondary | ICD-10-CM | POA: Diagnosis not present

## 2017-01-18 DIAGNOSIS — I5033 Acute on chronic diastolic (congestive) heart failure: Secondary | ICD-10-CM | POA: Diagnosis not present

## 2017-01-18 DIAGNOSIS — Z9981 Dependence on supplemental oxygen: Secondary | ICD-10-CM | POA: Diagnosis not present

## 2017-01-18 DIAGNOSIS — Z87440 Personal history of urinary (tract) infections: Secondary | ICD-10-CM | POA: Diagnosis not present

## 2017-01-18 DIAGNOSIS — Z87891 Personal history of nicotine dependence: Secondary | ICD-10-CM | POA: Diagnosis not present

## 2017-01-18 DIAGNOSIS — G4733 Obstructive sleep apnea (adult) (pediatric): Secondary | ICD-10-CM | POA: Diagnosis not present

## 2017-01-18 DIAGNOSIS — J441 Chronic obstructive pulmonary disease with (acute) exacerbation: Secondary | ICD-10-CM | POA: Diagnosis not present

## 2017-01-18 DIAGNOSIS — B370 Candidal stomatitis: Secondary | ICD-10-CM | POA: Diagnosis not present

## 2017-01-18 DIAGNOSIS — R531 Weakness: Secondary | ICD-10-CM | POA: Diagnosis not present

## 2017-01-18 DIAGNOSIS — J45909 Unspecified asthma, uncomplicated: Secondary | ICD-10-CM | POA: Diagnosis not present

## 2017-01-19 DIAGNOSIS — B370 Candidal stomatitis: Secondary | ICD-10-CM | POA: Diagnosis not present

## 2017-01-19 DIAGNOSIS — Z87440 Personal history of urinary (tract) infections: Secondary | ICD-10-CM | POA: Diagnosis not present

## 2017-01-19 DIAGNOSIS — R531 Weakness: Secondary | ICD-10-CM | POA: Diagnosis not present

## 2017-01-19 DIAGNOSIS — G4733 Obstructive sleep apnea (adult) (pediatric): Secondary | ICD-10-CM | POA: Diagnosis not present

## 2017-01-19 DIAGNOSIS — Z7982 Long term (current) use of aspirin: Secondary | ICD-10-CM | POA: Diagnosis not present

## 2017-01-19 DIAGNOSIS — Z9981 Dependence on supplemental oxygen: Secondary | ICD-10-CM | POA: Diagnosis not present

## 2017-01-19 DIAGNOSIS — J441 Chronic obstructive pulmonary disease with (acute) exacerbation: Secondary | ICD-10-CM | POA: Diagnosis not present

## 2017-01-19 DIAGNOSIS — I5033 Acute on chronic diastolic (congestive) heart failure: Secondary | ICD-10-CM | POA: Diagnosis not present

## 2017-01-19 DIAGNOSIS — J45909 Unspecified asthma, uncomplicated: Secondary | ICD-10-CM | POA: Diagnosis not present

## 2017-01-19 DIAGNOSIS — Z87891 Personal history of nicotine dependence: Secondary | ICD-10-CM | POA: Diagnosis not present

## 2017-01-20 DIAGNOSIS — R531 Weakness: Secondary | ICD-10-CM | POA: Diagnosis not present

## 2017-01-20 DIAGNOSIS — B370 Candidal stomatitis: Secondary | ICD-10-CM | POA: Diagnosis not present

## 2017-01-20 DIAGNOSIS — Z7982 Long term (current) use of aspirin: Secondary | ICD-10-CM | POA: Diagnosis not present

## 2017-01-20 DIAGNOSIS — I5033 Acute on chronic diastolic (congestive) heart failure: Secondary | ICD-10-CM | POA: Diagnosis not present

## 2017-01-20 DIAGNOSIS — J441 Chronic obstructive pulmonary disease with (acute) exacerbation: Secondary | ICD-10-CM | POA: Diagnosis not present

## 2017-01-20 DIAGNOSIS — Z87891 Personal history of nicotine dependence: Secondary | ICD-10-CM | POA: Diagnosis not present

## 2017-01-20 DIAGNOSIS — Z87440 Personal history of urinary (tract) infections: Secondary | ICD-10-CM | POA: Diagnosis not present

## 2017-01-20 DIAGNOSIS — G4733 Obstructive sleep apnea (adult) (pediatric): Secondary | ICD-10-CM | POA: Diagnosis not present

## 2017-01-20 DIAGNOSIS — Z9981 Dependence on supplemental oxygen: Secondary | ICD-10-CM | POA: Diagnosis not present

## 2017-01-20 DIAGNOSIS — J45909 Unspecified asthma, uncomplicated: Secondary | ICD-10-CM | POA: Diagnosis not present

## 2017-01-21 DIAGNOSIS — J441 Chronic obstructive pulmonary disease with (acute) exacerbation: Secondary | ICD-10-CM | POA: Diagnosis not present

## 2017-01-21 DIAGNOSIS — Z87891 Personal history of nicotine dependence: Secondary | ICD-10-CM | POA: Diagnosis not present

## 2017-01-21 DIAGNOSIS — G4733 Obstructive sleep apnea (adult) (pediatric): Secondary | ICD-10-CM | POA: Diagnosis not present

## 2017-01-21 DIAGNOSIS — Z87440 Personal history of urinary (tract) infections: Secondary | ICD-10-CM | POA: Diagnosis not present

## 2017-01-21 DIAGNOSIS — Z7982 Long term (current) use of aspirin: Secondary | ICD-10-CM | POA: Diagnosis not present

## 2017-01-21 DIAGNOSIS — R531 Weakness: Secondary | ICD-10-CM | POA: Diagnosis not present

## 2017-01-21 DIAGNOSIS — B370 Candidal stomatitis: Secondary | ICD-10-CM | POA: Diagnosis not present

## 2017-01-21 DIAGNOSIS — J45909 Unspecified asthma, uncomplicated: Secondary | ICD-10-CM | POA: Diagnosis not present

## 2017-01-21 DIAGNOSIS — Z9981 Dependence on supplemental oxygen: Secondary | ICD-10-CM | POA: Diagnosis not present

## 2017-01-21 DIAGNOSIS — I5033 Acute on chronic diastolic (congestive) heart failure: Secondary | ICD-10-CM | POA: Diagnosis not present

## 2017-01-22 ENCOUNTER — Telehealth: Payer: Self-pay | Admitting: Internal Medicine

## 2017-01-22 ENCOUNTER — Telehealth: Payer: Self-pay | Admitting: Cardiovascular Disease

## 2017-01-22 DIAGNOSIS — R0902 Hypoxemia: Secondary | ICD-10-CM | POA: Diagnosis not present

## 2017-01-22 DIAGNOSIS — I5033 Acute on chronic diastolic (congestive) heart failure: Secondary | ICD-10-CM | POA: Diagnosis not present

## 2017-01-22 DIAGNOSIS — Z7982 Long term (current) use of aspirin: Secondary | ICD-10-CM | POA: Diagnosis not present

## 2017-01-22 DIAGNOSIS — B370 Candidal stomatitis: Secondary | ICD-10-CM | POA: Diagnosis not present

## 2017-01-22 DIAGNOSIS — J441 Chronic obstructive pulmonary disease with (acute) exacerbation: Secondary | ICD-10-CM | POA: Diagnosis not present

## 2017-01-22 DIAGNOSIS — Z87440 Personal history of urinary (tract) infections: Secondary | ICD-10-CM | POA: Diagnosis not present

## 2017-01-22 DIAGNOSIS — G4733 Obstructive sleep apnea (adult) (pediatric): Secondary | ICD-10-CM | POA: Diagnosis not present

## 2017-01-22 DIAGNOSIS — J45909 Unspecified asthma, uncomplicated: Secondary | ICD-10-CM | POA: Diagnosis not present

## 2017-01-22 DIAGNOSIS — Z9981 Dependence on supplemental oxygen: Secondary | ICD-10-CM | POA: Diagnosis not present

## 2017-01-22 DIAGNOSIS — Z87891 Personal history of nicotine dependence: Secondary | ICD-10-CM | POA: Diagnosis not present

## 2017-01-22 DIAGNOSIS — R531 Weakness: Secondary | ICD-10-CM | POA: Diagnosis not present

## 2017-01-22 DIAGNOSIS — J449 Chronic obstructive pulmonary disease, unspecified: Secondary | ICD-10-CM | POA: Diagnosis not present

## 2017-01-22 NOTE — Telephone Encounter (Signed)
Advised pt has not been seen here before and has an appt to est care in July.  Advised to call PCP or report to ED for eval d/t cognitive changes.

## 2017-01-22 NOTE — Telephone Encounter (Signed)
Dr Jenny Reichmann, Should labs be ordered or should she be seen at the ED for the symptoms she is having? Please advise.

## 2017-01-22 NOTE — Telephone Encounter (Signed)
°  New Prob   States she went out to see pt today and noticed pt was confused, worsening SOB, weakness, and appeared a little shaky. States she looked different from Tuesday visit. Calling to see if Dr. Johnsie Cancel would like to draw some labs. Please call.

## 2017-01-22 NOTE — Telephone Encounter (Signed)
Informed Michele Martin that pt should go to the ER asap.

## 2017-01-22 NOTE — Telephone Encounter (Signed)
Needs ER now please

## 2017-01-22 NOTE — Telephone Encounter (Signed)
Megan from [facility] is with the pt right now. She said that she is having shortness of breath, she is shaking and showing signs of weakness. This is much different from her visit with her a couple days ago. She did not know if some labs should be ordered or what Dr Jenny Reichmann would recommend. She can be reached at [phone].

## 2017-01-26 DIAGNOSIS — Z9981 Dependence on supplemental oxygen: Secondary | ICD-10-CM | POA: Diagnosis not present

## 2017-01-26 DIAGNOSIS — Z87440 Personal history of urinary (tract) infections: Secondary | ICD-10-CM | POA: Diagnosis not present

## 2017-01-26 DIAGNOSIS — J441 Chronic obstructive pulmonary disease with (acute) exacerbation: Secondary | ICD-10-CM | POA: Diagnosis not present

## 2017-01-26 DIAGNOSIS — J45909 Unspecified asthma, uncomplicated: Secondary | ICD-10-CM | POA: Diagnosis not present

## 2017-01-26 DIAGNOSIS — I5033 Acute on chronic diastolic (congestive) heart failure: Secondary | ICD-10-CM | POA: Diagnosis not present

## 2017-01-26 DIAGNOSIS — B370 Candidal stomatitis: Secondary | ICD-10-CM | POA: Diagnosis not present

## 2017-01-26 DIAGNOSIS — R531 Weakness: Secondary | ICD-10-CM | POA: Diagnosis not present

## 2017-01-26 DIAGNOSIS — G4733 Obstructive sleep apnea (adult) (pediatric): Secondary | ICD-10-CM | POA: Diagnosis not present

## 2017-01-26 DIAGNOSIS — Z7982 Long term (current) use of aspirin: Secondary | ICD-10-CM | POA: Diagnosis not present

## 2017-01-26 DIAGNOSIS — Z87891 Personal history of nicotine dependence: Secondary | ICD-10-CM | POA: Diagnosis not present

## 2017-01-27 NOTE — Progress Notes (Signed)
Cardiology Office Note   Date:  02/02/2017   ID:  Michele Martin, Michele Martin 30-Apr-1946, MRN [id number]  PCP:  Biagio Borg, MD  Cardiologist:   Jenkins Rouge, MD   No chief complaint on file.     History of Present Illness: Michele Martin is a 71 y.o. female who presents for consultation regarding CHF Seen at the request of Dr Jenny Reichmann Former smoker on 2L oxygen sees Tammy Parrett CT shows moderate to severe emphysema. FEF1 1.7 moderate diffusion Defect and some bronchodilator response Hospitalized in May 2018 for sepsis COPD exacerbation and ? Diastolic dysfunction. Was aggressively hydrated for sepsis at time  D/C on Lasix 20 mg daily  Chest pain then and r/o with no acute ECG changes  Negative troponin x 3 thought to be due to COPD and Pneumonia Echo reviewed from 07/27/16 EF 60-65% only grade on diastolic dysfunction moderate LAE Trivial TR and no pulmonary hypertension Seen by Dr Jenny Reichmann 01/13/17 and complained of atypical chest pain CP is mild, intermittent, sharp and dull sometimes, fleeting for seconds, may be pleuritic but not sure, and is nonexertional and nonpositional, without radiation, assoc with n/v, palp or diaphoresis Labs reviewed from 01/13/17 and BNP is normal K 3.3 Cr .72     Past Medical History:  Diagnosis Date  . Acute angle-closure glaucoma 02/14/2010  . ALLERGIC RHINITIS 09/19/2009  . ANXIETY 04/15/2010  . Arthritis   . ASTHMA 09/19/2009  . BACK PAIN 02/14/2010  . Chronic bronchitis 04/14/2011  . CHRONIC OBSTRUCTIVE PULMONARY DISEASE, ACUTE EXACERBATION 12/19/2009  . Complication of anesthesia   . COPD 06/27/2009  . DEPRESSION 09/19/2009  . HOARSENESS 10/17/2009  . HYPERLIPIDEMIA 09/19/2009  . MENOPAUSAL DISORDER 08/20/2010  . OSTEOPENIA 09/10/2010  . Osteoporosis, unspecified 04/20/2014  . Rotator cuff tear 06/23/2011  . TINNITUS 09/10/2010  . TREMOR 02/14/2010  . Tremor 07/30/2011  . URI 09/10/2010  . Wheezing 06/27/2009    Past Surgical History:  Procedure  Laterality Date  . ABDOMINAL HYSTERECTOMY    . cervical fusion 2013 - Dr Vertell Limber    . Normal Heart Cath  2001   Dr. Johnsie Cancel  . s/p laser tx for glaucoma     no vision loss     Current Outpatient Prescriptions  Medication Sig Dispense Refill  . albuterol (PROVENTIL HFA) 108 (90 Base) MCG/ACT inhaler Inhale 2 puffs into the lungs every 6 (six) hours as needed for wheezing or shortness of breath. 18 g 5  . albuterol (PROVENTIL) (2.5 MG/3ML) 0.083% nebulizer solution Take 3 mLs (2.5 mg total) by nebulization every 6 (six) hours as needed for wheezing or shortness of breath. 150 mL 1  . aspirin EC 81 MG tablet Take 81 mg by mouth at bedtime.    [name] atorvastatin (LIPITOR) 20 MG tablet Take 20 mg by mouth at bedtime.    [name] b complex vitamins tablet Take 1 tablet by mouth at bedtime.    . budesonide-formoterol (SYMBICORT) 160-4.5 MCG/ACT inhaler Inhale 2 puffs into the lungs 2 (two) times daily. 3 Inhaler 3  . clonazePAM (KLONOPIN) 0.5 MG tablet Take 1 tablet (0.5 mg total) by mouth 2 (two) times daily as needed for anxiety. 60 tablet 5  . dextromethorphan-guaiFENesin (MUCINEX DM) 30-600 MG 12hr tablet Take 2 tablets by mouth 2 (two) times daily. 60 tablet 0  . donepezil (ARICEPT) 10 MG tablet Take 1 tablet (10 mg total) by mouth at bedtime.    [name] escitalopram (LEXAPRO) 20 MG tablet Take 0.5 tablets (  10 mg total) by mouth daily. 45 tablet 3  . furosemide (LASIX) 40 MG tablet Take 1 tablet (40 mg total) by mouth daily. 90 tablet 3  . gabapentin (NEURONTIN) 100 MG capsule TAKE 1 CAPSULE BY MOUTH THREE TIMES DAILY 90 capsule 11  . hydrochlorothiazide (HYDRODIURIL) 25 MG tablet Take 25 mg by mouth daily.    . montelukast (SINGULAIR) 10 MG tablet TAKE 1 TABLET BY MOUTH EVERY NIGHT AT BEDTIME 30 tablet 11  . potassium chloride (K-DUR) 10 MEQ tablet Take 1 tablet (10 mEq total) by mouth daily. 90 tablet 3  . Probiotic Product (PROBIOTIC PO) Take 1 capsule by mouth daily.    [name] tiotropium (SPIRIVA HANDIHALER) 18  MCG inhalation capsule Place 1 capsule (18 mcg total) into inhaler and inhale daily. 90 capsule 3   No current facility-administered medications for this visit.     Allergies:   Pravastatin; Simvastatin; and Statins    Social History:  The patient  reports that she has quit smoking. Her smoking use included Cigarettes. She started smoking about 19 months ago. She has a 25.00 pack-year smoking history. She has never used smokeless tobacco. She reports that she does not drink alcohol or use drugs.   Family History:  The patient's family history includes Dementia in her mother; Rheum arthritis in her maternal uncle.    ROS:  Please see the history of present illness.   Otherwise, review of systems are positive for none.   All other systems are reviewed and negative.    PHYSICAL EXAM: VS:  BP 120/64 (BP Location: Right Arm)   Pulse 91   Ht 5\' 1"  (1.549 m)   Wt 108 lb (49 kg)   SpO2 95%   BMI 20.41 kg/m  , BMI Body mass index is 20.41 kg/m. Affect appropriate Chronically ill tremulous female  HEENT: normal Neck supple with no adenopathy JVP normal no bruits no thyromegaly Lungs poor air movement no active wheezing COPD  Heart:  S1/S2 no murmur, no rub, gallop or click PMI normal Abdomen: benighn, BS positve, no tenderness, no AAA no bruit.  No HSM or HJR Distal pulses intact with no bruits No edema Neuro non-focal Skin easy bruising  No muscular weakness    EKG:  SR rate 103 essentially normal    Recent Labs: 08/04/2016: B Natriuretic Peptide 52.9 10/28/2016: TSH 0.55 12/05/2016: ALT 26 12/07/2016: Magnesium 2.3 12/11/2016: Hemoglobin 12.6; Platelets 181 01/13/2017: BUN 15; Creatinine, Ser 0.72; Potassium 3.3; Pro B Natriuretic peptide (BNP) 42.0; Sodium 141    Lipid Panel    Component Value Date/Time   CHOL 222 (H) 08/09/2015 1504   TRIG 95.0 08/09/2015 1504   HDL 89.50 08/09/2015 1504   CHOLHDL 2 08/09/2015 1504   VLDL 19.0 08/09/2015 1504   LDLCALC 114 (H)  08/09/2015 1504   LDLDIRECT 164.4 07/19/2013 1110      Wt Readings from Last 3 Encounters:  02/02/17 108 lb (49 kg)  01/15/17 105 lb (47.6 kg)  01/13/17 103 lb (46.7 kg)      Other studies Reviewed: Additional studies/ records that were reviewed today include: Notes from hospitalization May 2018 ECG , Labs, CXR Office visit Dr Jenny Reichmann primary and [facility] CT and echo from 2017 .    ASSESSMENT AND PLAN:  1.  COPD stage 4 little insight into severity Continue inhalers needs to stop smoking (sister indicates she is still smoking) 2. Diastolic CHF related to volume in hospital during sepsis euvolemic now. F/u echo to  make sure EF still normal  3. Chest Pain atypical related to lung disease would not pursue CAD at this time as quality of life and activity level Limited by lung disease at this point discussed with patient and sister.  4. Cholesterol   Lab Results  Component Value Date   LDLCALC 114 (H) 08/09/2015    5. Anxiety/Depression  Consider PRN xanax given COPD 6. Dementia on lexapro seems compensated at this time    Current medicines are reviewed at length with the patient today.  The patient does not have concerns regarding medicines.  The following changes have been made:  no change  Labs/ tests ordered today include: Echo   Orders Placed This Encounter  Procedures  . ECHOCARDIOGRAM COMPLETE     Disposition:   FU with cardiology PRN      Signed, Jenkins Rouge, MD  02/02/2017 9:54 AM    [facility] [place], [place], [place]  [address] Phone: [phone]; Fax: [phone]

## 2017-01-29 DIAGNOSIS — J441 Chronic obstructive pulmonary disease with (acute) exacerbation: Secondary | ICD-10-CM | POA: Diagnosis not present

## 2017-01-29 DIAGNOSIS — Z87440 Personal history of urinary (tract) infections: Secondary | ICD-10-CM | POA: Diagnosis not present

## 2017-01-29 DIAGNOSIS — B370 Candidal stomatitis: Secondary | ICD-10-CM | POA: Diagnosis not present

## 2017-01-29 DIAGNOSIS — J45909 Unspecified asthma, uncomplicated: Secondary | ICD-10-CM | POA: Diagnosis not present

## 2017-01-29 DIAGNOSIS — I5033 Acute on chronic diastolic (congestive) heart failure: Secondary | ICD-10-CM | POA: Diagnosis not present

## 2017-01-29 DIAGNOSIS — Z7982 Long term (current) use of aspirin: Secondary | ICD-10-CM | POA: Diagnosis not present

## 2017-01-29 DIAGNOSIS — G4733 Obstructive sleep apnea (adult) (pediatric): Secondary | ICD-10-CM | POA: Diagnosis not present

## 2017-01-29 DIAGNOSIS — Z87891 Personal history of nicotine dependence: Secondary | ICD-10-CM | POA: Diagnosis not present

## 2017-01-29 DIAGNOSIS — R531 Weakness: Secondary | ICD-10-CM | POA: Diagnosis not present

## 2017-01-29 DIAGNOSIS — Z9981 Dependence on supplemental oxygen: Secondary | ICD-10-CM | POA: Diagnosis not present

## 2017-01-30 ENCOUNTER — Telehealth: Payer: Self-pay | Admitting: Internal Medicine

## 2017-01-30 DIAGNOSIS — B370 Candidal stomatitis: Secondary | ICD-10-CM | POA: Diagnosis not present

## 2017-01-30 DIAGNOSIS — R531 Weakness: Secondary | ICD-10-CM | POA: Diagnosis not present

## 2017-01-30 DIAGNOSIS — G4733 Obstructive sleep apnea (adult) (pediatric): Secondary | ICD-10-CM | POA: Diagnosis not present

## 2017-01-30 DIAGNOSIS — Z87891 Personal history of nicotine dependence: Secondary | ICD-10-CM | POA: Diagnosis not present

## 2017-01-30 DIAGNOSIS — Z87440 Personal history of urinary (tract) infections: Secondary | ICD-10-CM | POA: Diagnosis not present

## 2017-01-30 DIAGNOSIS — J441 Chronic obstructive pulmonary disease with (acute) exacerbation: Secondary | ICD-10-CM | POA: Diagnosis not present

## 2017-01-30 DIAGNOSIS — J45909 Unspecified asthma, uncomplicated: Secondary | ICD-10-CM | POA: Diagnosis not present

## 2017-01-30 DIAGNOSIS — Z9981 Dependence on supplemental oxygen: Secondary | ICD-10-CM | POA: Diagnosis not present

## 2017-01-30 DIAGNOSIS — I5033 Acute on chronic diastolic (congestive) heart failure: Secondary | ICD-10-CM | POA: Diagnosis not present

## 2017-01-30 DIAGNOSIS — Z7982 Long term (current) use of aspirin: Secondary | ICD-10-CM | POA: Diagnosis not present

## 2017-01-30 NOTE — Telephone Encounter (Signed)
Pt declined medical advise and said that she is not going to the ED. She has an appt on Monday with Nishan on Monday and she will wait for that appt to see what he says.

## 2017-01-30 NOTE — Telephone Encounter (Signed)
FYI: PT called to inform of BP  95/70 sitting 80/60 after PT  Patient is staying she has been coughing up mucus. PT inform to make sure she gets enough fluid and take her time doing things.  Questions [phone] Shanon Brow

## 2017-01-30 NOTE — Telephone Encounter (Signed)
Pt should go to ED for hypotension and possible dehdyration

## 2017-02-02 ENCOUNTER — Ambulatory Visit (INDEPENDENT_AMBULATORY_CARE_PROVIDER_SITE_OTHER): Payer: Medicare Other | Admitting: Cardiovascular Disease

## 2017-02-02 ENCOUNTER — Encounter: Payer: Self-pay | Admitting: Cardiovascular Disease

## 2017-02-02 VITALS — BP 120/64 | HR 91 | Ht 61.0 in | Wt 108.0 lb

## 2017-02-02 DIAGNOSIS — R0609 Other forms of dyspnea: Secondary | ICD-10-CM

## 2017-02-02 NOTE — Patient Instructions (Addendum)

## 2017-02-04 ENCOUNTER — Telehealth: Payer: Self-pay | Admitting: Internal Medicine

## 2017-02-04 DIAGNOSIS — R531 Weakness: Secondary | ICD-10-CM | POA: Diagnosis not present

## 2017-02-04 DIAGNOSIS — G4733 Obstructive sleep apnea (adult) (pediatric): Secondary | ICD-10-CM | POA: Diagnosis not present

## 2017-02-04 DIAGNOSIS — J441 Chronic obstructive pulmonary disease with (acute) exacerbation: Secondary | ICD-10-CM | POA: Diagnosis not present

## 2017-02-04 DIAGNOSIS — Z9981 Dependence on supplemental oxygen: Secondary | ICD-10-CM | POA: Diagnosis not present

## 2017-02-04 DIAGNOSIS — B370 Candidal stomatitis: Secondary | ICD-10-CM | POA: Diagnosis not present

## 2017-02-04 DIAGNOSIS — I5033 Acute on chronic diastolic (congestive) heart failure: Secondary | ICD-10-CM | POA: Diagnosis not present

## 2017-02-04 DIAGNOSIS — J45909 Unspecified asthma, uncomplicated: Secondary | ICD-10-CM | POA: Diagnosis not present

## 2017-02-04 DIAGNOSIS — Z87891 Personal history of nicotine dependence: Secondary | ICD-10-CM | POA: Diagnosis not present

## 2017-02-04 DIAGNOSIS — Z87440 Personal history of urinary (tract) infections: Secondary | ICD-10-CM | POA: Diagnosis not present

## 2017-02-04 DIAGNOSIS — Z7982 Long term (current) use of aspirin: Secondary | ICD-10-CM | POA: Diagnosis not present

## 2017-02-04 NOTE — Telephone Encounter (Signed)
Michele Martin from [facility] called to report that the pts weight was 107 today. They are told to report to Dr Jenny Reichmann if her weight is out of the 98-104 range. She also said that on 01/26/17 her weight was 102 and on 01/29/17 it was 105.

## 2017-02-04 NOTE — Telephone Encounter (Signed)
FYI

## 2017-02-06 ENCOUNTER — Telehealth: Payer: Self-pay | Admitting: Internal Medicine

## 2017-02-06 MED ORDER — ALBUTEROL SULFATE HFA 108 (90 BASE) MCG/ACT IN AERS: 2.0000 | INHALATION_SPRAY | Freq: Four times a day (QID) | RESPIRATORY_TRACT | 5 refills | Status: DC | PRN

## 2017-02-06 MED ORDER — ATORVASTATIN CALCIUM 20 MG PO TABS: 20.0000 mg | ORAL_TABLET | Freq: Every day | ORAL | 1 refills | Status: AC

## 2017-02-06 NOTE — Telephone Encounter (Signed)
Done

## 2017-02-06 NOTE — Telephone Encounter (Signed)
[facility] called in and said that pt called them and needs refill on   Albuterol  Atorvastatin   Phone number [phone] Fax number  [phone]

## 2017-02-09 ENCOUNTER — Encounter: Payer: Self-pay | Admitting: Pulmonary Disease

## 2017-02-09 ENCOUNTER — Ambulatory Visit (INDEPENDENT_AMBULATORY_CARE_PROVIDER_SITE_OTHER): Payer: Medicare Other | Admitting: Pulmonary Disease

## 2017-02-09 VITALS — BP 124/72 | HR 91 | Ht 61.0 in | Wt 103.8 lb

## 2017-02-09 DIAGNOSIS — J441 Chronic obstructive pulmonary disease with (acute) exacerbation: Secondary | ICD-10-CM

## 2017-02-09 DIAGNOSIS — F17210 Nicotine dependence, cigarettes, uncomplicated: Secondary | ICD-10-CM

## 2017-02-09 MED ORDER — UMECLIDINIUM BROMIDE 62.5 MCG/INH IN AEPB: 1.0000 | INHALATION_SPRAY | Freq: Every day | RESPIRATORY_TRACT | 0 refills | Status: AC

## 2017-02-09 MED ORDER — UMECLIDINIUM BROMIDE 62.5 MCG/INH IN AEPB: 1.0000 | INHALATION_SPRAY | Freq: Every day | RESPIRATORY_TRACT | 6 refills | Status: DC

## 2017-02-09 MED ORDER — ROFLUMILAST 250 MCG PO TABS
250.0000 mg | ORAL_TABLET | Freq: Every day | ORAL | 0 refills | Status: DC
Start: 2017-02-09 — End: 2017-02-25

## 2017-02-09 MED ORDER — ROFLUMILAST 500 MCG PO TABS: 500.0000 ug | ORAL_TABLET | Freq: Every day | ORAL | 6 refills | Status: DC

## 2017-02-09 MED ORDER — PREDNISONE 10 MG PO TABS: ORAL_TABLET | ORAL | 0 refills | Status: DC

## 2017-02-09 MED ORDER — FLUTICASONE FUROATE-VILANTEROL 200-25 MCG/INH IN AEPB: 1.0000 | INHALATION_SPRAY | Freq: Every day | RESPIRATORY_TRACT | 0 refills | Status: AC

## 2017-02-09 MED ORDER — ROFLUMILAST 500 MCG PO TABS: 500.0000 ug | ORAL_TABLET | Freq: Every day | ORAL | 0 refills | Status: DC

## 2017-02-09 MED ORDER — METHYLPREDNISOLONE ACETATE 80 MG/ML IJ SUSP
80.0000 mg | Freq: Once | INTRAMUSCULAR | Status: AC
  Administered 2017-02-09: 80 mg via INTRAMUSCULAR

## 2017-02-09 MED ORDER — FLUTICASONE FUROATE-VILANTEROL 200-25 MCG/INH IN AEPB: 1.0000 | INHALATION_SPRAY | Freq: Every day | RESPIRATORY_TRACT | 6 refills | Status: DC

## 2017-02-09 NOTE — Progress Notes (Signed)
Michele Martin    [id number]    Aug 10, 1945  Primary Care Physician:John, Hunt Oris, MD  Referring Physician: Biagio Borg, MD [place], [place] [id number]  Chief complaint:   Follow up for COPD GOLD D (CAT score 23, 4 exacerbations over the past year)  HPI:  71 year old with history of COPD, ex-smoker. She's had multiple exacerbations over the past year. She was last hospitalized in January for COPD exacerbation. She had a clear chest x-ray. She took the BiPAP briefly and improved with a prolonged steroid taper, antibiotics. Anxiety was also noted to be contributing to her dyspnea.  Since discharge she reports stable respiratory symptoms. She has chronic cough with sputum production, dyspnea with exertion. She denies any chest pain, palpitation, wheeze, fevers, chills. She has been maintained on Symbicort and Spiriva and albuterol. She reportedly quit smoking and is on "plastic cigarettes", likely e cigarettes. She has been ambulated during the past admission with no desaturations noted. She continues on a rehabilitation and exercise program at home.  Interim History: Since her last visit her breathing is worse in. She reports increasing dyspnea, cough with white mucus, wheezing, chest tightness. She continues to smoke cigarettes but is minimizing the number of cigarettes she is using.  Outpatient Encounter Prescriptions as of 02/09/2017  Medication Sig  . albuterol (PROVENTIL HFA) 108 (90 Base) MCG/ACT inhaler Inhale 2 puffs into the lungs every 6 (six) hours as needed for wheezing or shortness of breath.  [name] albuterol (PROVENTIL) (2.5 MG/3ML) 0.083% nebulizer solution Take 3 mLs (2.5 mg total) by nebulization every 6 (six) hours as needed for wheezing or shortness of breath.  [name] aspirin EC 81 MG tablet Take 81 mg by mouth at bedtime.  [name] atorvastatin (LIPITOR) 20 MG tablet Take 1 tablet (20 mg total) by mouth at bedtime.  [name] b complex vitamins tablet Take 1  tablet by mouth at bedtime.  . budesonide-formoterol (SYMBICORT) 160-4.5 MCG/ACT inhaler Inhale 2 puffs into the lungs 2 (two) times daily.  . clonazePAM (KLONOPIN) 0.5 MG tablet Take 1 tablet (0.5 mg total) by mouth 2 (two) times daily as needed for anxiety.  [name] dextromethorphan-guaiFENesin (MUCINEX DM) 30-600 MG 12hr tablet Take 2 tablets by mouth 2 (two) times daily.  [name] donepezil (ARICEPT) 10 MG tablet Take 1 tablet (10 mg total) by mouth at bedtime.  [name] escitalopram (LEXAPRO) 20 MG tablet Take 0.5 tablets (10 mg total) by mouth daily.  . furosemide (LASIX) 40 MG tablet Take 1 tablet (40 mg total) by mouth daily.  [name] gabapentin (NEURONTIN) 100 MG capsule TAKE 1 CAPSULE BY MOUTH THREE TIMES DAILY  . hydrochlorothiazide (HYDRODIURIL) 25 MG tablet Take 25 mg by mouth daily.  . montelukast (SINGULAIR) 10 MG tablet TAKE 1 TABLET BY MOUTH EVERY NIGHT AT BEDTIME  . potassium chloride (K-DUR) 10 MEQ tablet Take 1 tablet (10 mEq total) by mouth daily.  . Probiotic Product (PROBIOTIC PO) Take 1 capsule by mouth daily.  [name] tiotropium (SPIRIVA HANDIHALER) 18 MCG inhalation capsule Place 1 capsule (18 mcg total) into inhaler and inhale daily.  . [DISCONTINUED] albuterol (PROVENTIL HFA) 108 (90 Base) MCG/ACT inhaler Inhale 2 puffs into the lungs every 6 (six) hours as needed for wheezing or shortness of breath.  . [DISCONTINUED] atorvastatin (LIPITOR) 20 MG tablet Take 20 mg by mouth at bedtime.   No facility-administered encounter medications on file as of 02/09/2017.     Allergies as of 02/09/2017 -  Review Complete 02/09/2017  Allergen Reaction Noted  . Pravastatin Itching and Other (See Comments) 08/20/2010  . Simvastatin Itching and Other (See Comments) 09/19/2009  . Statins Itching and Other (See Comments) 10/14/2011    Past Medical History:  Diagnosis Date  . Acute angle-closure glaucoma 02/14/2010  . ALLERGIC RHINITIS 09/19/2009  . ANXIETY 04/15/2010  . Arthritis   . ASTHMA 09/19/2009  . BACK  PAIN 02/14/2010  . Chronic bronchitis 04/14/2011  . CHRONIC OBSTRUCTIVE PULMONARY DISEASE, ACUTE EXACERBATION 12/19/2009  . Complication of anesthesia   . COPD 06/27/2009  . DEPRESSION 09/19/2009  . HOARSENESS 10/17/2009  . HYPERLIPIDEMIA 09/19/2009  . MENOPAUSAL DISORDER 08/20/2010  . OSTEOPENIA 09/10/2010  . Osteoporosis, unspecified 04/20/2014  . Rotator cuff tear 06/23/2011  . TINNITUS 09/10/2010  . TREMOR 02/14/2010  . Tremor 07/30/2011  . URI 09/10/2010  . Wheezing 06/27/2009    Past Surgical History:  Procedure Laterality Date  . ABDOMINAL HYSTERECTOMY    . cervical fusion 2013 - Dr Vertell Limber    . Normal Heart Cath  2001   Dr. Johnsie Cancel  . s/p laser tx for glaucoma     no vision loss    Family History  Problem Relation Age of Onset  . Dementia Mother   . Rheum arthritis Maternal Uncle   . Colon cancer Neg Hx     Social History   Social History  . Marital status: Widowed    Spouse name: N/A  . Number of children: 2  . Years of education: N/A   Occupational History  . 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 Smoking status: Current Some Day Smoker    Packs/day: 0.50    Years: 50.00    Types: Cigarettes    Start date: 07/03/2015  . Smokeless tobacco: Never Used     Comment: pt smokes 2 cigarettes daily-02/09/17  . Alcohol use No  . Drug use: No  . Sexual activity: No   Other Topics Concern  . Not on file   Social History Narrative  . No narrative on file    Review of systems: Review of Systems  Constitutional: Negative for fever and chills.  HENT: Negative.   Eyes: Negative for blurred vision.  Respiratory: as per HPI  Cardiovascular: Negative for chest pain and palpitations.  Gastrointestinal: Negative for vomiting, diarrhea, blood per rectum. Genitourinary: Negative for dysuria, urgency, frequency and hematuria.  Musculoskeletal: Negative for myalgias, back pain and joint pain.  Skin: Negative  for itching and rash.  Neurological: Negative for dizziness, tremors, focal weakness, seizures and loss of consciousness.  Endo/Heme/Allergies: Negative for environmental allergies.  Psychiatric/Behavioral: Negative for depression, suicidal ideas and hallucinations.  All other systems reviewed and are negative.  Physical Exam: Blood pressure 124/72, pulse 91, height 5\' 1"  (1.549 m), weight 103 lb 12.8 oz (47.1 kg), SpO2 97 %. Gen:      No acute distress HEENT:  EOMI, sclera anicteric Neck:     No masses; no thyromegaly Lungs:    Mild exp wheeze, crackles; normal respiratory effort CV:         Regular rate and rhythm; no murmurs Abd:      + bowel sounds; soft, non-tender; no palpable masses, no distension Ext:    No edema; adequate peripheral perfusion Skin:      Warm and dry; no rash Neuro: alert and oriented x 3 Psych: normal mood and affect  Data Reviewed: Screening CT chest 09/04/14-5.5 mm  nodule in the right upper lobe, moderate to severe emphysematous changes Screening CT chest 10/24/15- resolved right upper lobe nodule, new groundglass opacity in the right lower lobe, moderate to severe asthmatic changes Screening CT chest 11/11/16- Lung-RADS Category 2, benign appearance or behavior Chest x-ray 08/04/16-hyperinflation, no lung infiltrate Chest x-ray 01/13/17-hyperinflation, no acute pulmonary process I have reviewed all images personally  PFTs 04/20/13 FVC 1.71 [63%] FEV1 0.94 (46%) F/F 55 TLC 109% RV/TLC 148% DLCO 64% Severe obstructive avid disease with a bronchodilator response, trapping Moderate diffusion defect  Assessment:  COPD GOLD D She's had multiple exacerbations in the past and appears to be in the middle of a minor exacerbation today. We'll treat her with a Depo 80 mg IM and start prednisone taper starting at 40 mg per a decrease dose to 10 mg every 3 days Stop symbicort, spiriva and try breo, incruse Start daliresp for severe COPD with frequent exacerbations.    She is an exercise program at home and does not want formal pulmonary rehabilitation therapy  Smoker Still continues to smoke occasionally. I encouraged her to quit completely and offered help with nicotine replacement therapy but she declined. Time spent counseling-5 minutes Continue on low dose screening CT program  Plan/Recommendations: - Depo 80 mg IM today and pred taper.  - Stop symbicort, spiriva. Start breo, incruse - Start dalipresp - Continue low dose screening CT - Smoking cessation  Marshell Garfinkel MD [facility] Pager [phone] 02/09/2017, 3:17 PM  CC: Biagio Borg, MD

## 2017-02-09 NOTE — Patient Instructions (Addendum)
We will give a depo shot today and star prednisone taper at 40 mg. Reduce dose by 10 mg every 3 days Stop Symbicort, spiriva, We will switch to breo, incruse Start daliresp Continue to work on smoking cessation  Follow up in 6 months.

## 2017-02-10 ENCOUNTER — Telehealth: Payer: Self-pay | Admitting: *Deleted

## 2017-02-10 ENCOUNTER — Telehealth: Payer: Self-pay | Admitting: Internal Medicine

## 2017-02-10 DIAGNOSIS — J441 Chronic obstructive pulmonary disease with (acute) exacerbation: Secondary | ICD-10-CM | POA: Diagnosis not present

## 2017-02-10 DIAGNOSIS — Z7982 Long term (current) use of aspirin: Secondary | ICD-10-CM | POA: Diagnosis not present

## 2017-02-10 DIAGNOSIS — Z9981 Dependence on supplemental oxygen: Secondary | ICD-10-CM | POA: Diagnosis not present

## 2017-02-10 DIAGNOSIS — Z87440 Personal history of urinary (tract) infections: Secondary | ICD-10-CM | POA: Diagnosis not present

## 2017-02-10 DIAGNOSIS — M810 Age-related osteoporosis without current pathological fracture: Secondary | ICD-10-CM | POA: Diagnosis not present

## 2017-02-10 DIAGNOSIS — G4733 Obstructive sleep apnea (adult) (pediatric): Secondary | ICD-10-CM | POA: Diagnosis not present

## 2017-02-10 DIAGNOSIS — I5033 Acute on chronic diastolic (congestive) heart failure: Secondary | ICD-10-CM | POA: Diagnosis not present

## 2017-02-10 MED ORDER — ALBUTEROL SULFATE (2.5 MG/3ML) 0.083% IN NEBU: 2.5000 mg | INHALATION_SOLUTION | Freq: Four times a day (QID) | RESPIRATORY_TRACT | 1 refills | Status: DC | PRN

## 2017-02-10 NOTE — Telephone Encounter (Signed)
Done

## 2017-02-10 NOTE — Telephone Encounter (Signed)
[facility] called requesting a refill on the pts albuterol (PROVENTIL) (2.5 MG/3ML) 0.083% nebulizer solution. Can this be sent in? Please advise.

## 2017-02-10 NOTE — Telephone Encounter (Signed)
PA has been started for Daliresp 500 mg via covermymeds. LMB:[id number] PENDING APPROVAL.

## 2017-02-10 NOTE — Telephone Encounter (Signed)
PA Approved for Dalisresp. Valid 02/10/17 approval faxed to pharmacy.

## 2017-02-12 ENCOUNTER — Ambulatory Visit (HOSPITAL_COMMUNITY): Payer: Medicare Other | Attending: Cardiovascular Disease

## 2017-02-12 ENCOUNTER — Other Ambulatory Visit: Payer: Self-pay

## 2017-02-12 DIAGNOSIS — R0609 Other forms of dyspnea: Secondary | ICD-10-CM | POA: Diagnosis not present

## 2017-02-12 DIAGNOSIS — I5189 Other ill-defined heart diseases: Secondary | ICD-10-CM | POA: Diagnosis not present

## 2017-02-12 NOTE — Telephone Encounter (Signed)
Ok for verbals 

## 2017-02-12 NOTE — Telephone Encounter (Signed)
Estill Bamberg from [facility] would like verbals to extend nursing visits for 1week 1, this would be a recerification visit and will call back to get her frequency    [phone]

## 2017-02-13 DIAGNOSIS — G4733 Obstructive sleep apnea (adult) (pediatric): Secondary | ICD-10-CM | POA: Diagnosis not present

## 2017-02-13 DIAGNOSIS — M810 Age-related osteoporosis without current pathological fracture: Secondary | ICD-10-CM | POA: Diagnosis not present

## 2017-02-13 DIAGNOSIS — J441 Chronic obstructive pulmonary disease with (acute) exacerbation: Secondary | ICD-10-CM | POA: Diagnosis not present

## 2017-02-13 DIAGNOSIS — Z9981 Dependence on supplemental oxygen: Secondary | ICD-10-CM | POA: Diagnosis not present

## 2017-02-13 DIAGNOSIS — Z7982 Long term (current) use of aspirin: Secondary | ICD-10-CM | POA: Diagnosis not present

## 2017-02-13 DIAGNOSIS — I5033 Acute on chronic diastolic (congestive) heart failure: Secondary | ICD-10-CM | POA: Diagnosis not present

## 2017-02-13 DIAGNOSIS — Z87440 Personal history of urinary (tract) infections: Secondary | ICD-10-CM | POA: Diagnosis not present

## 2017-02-13 NOTE — Telephone Encounter (Signed)
Called [name], LVM with verbal ok to proceed.

## 2017-02-14 DIAGNOSIS — J449 Chronic obstructive pulmonary disease, unspecified: Secondary | ICD-10-CM | POA: Diagnosis not present

## 2017-02-16 ENCOUNTER — Other Ambulatory Visit: Payer: Self-pay

## 2017-02-16 NOTE — Patient Outreach (Signed)
Telephone assessment:  Placed call to patient. No answer.  PLAN: Will continue to outreach patient to determine any additional needs.  Tomasa Rand, RN, BSN, CEN [facility] [employer] [phone]

## 2017-02-17 ENCOUNTER — Other Ambulatory Visit: Payer: Self-pay

## 2017-02-17 NOTE — Patient Outreach (Signed)
60 day Telephone follow up:  Placed call to patient who states that she is doing well. Reports weight is up and down.  Reports todays weight of 102 pounds for the last 2 days.    Reports no swelling.   Patient reports that she saw the pulmonologist and her medications were changed to daliresp( cost 114.00) breo ( greater than 200.00)  and incruse ( greater thatn 200.00).  Patient reports that she can not afford these medications.  Patient denies any current nursing needs at this time and has met her goals. PLAN: Order placed for pharmacy. Will send update letter to MD. Will send nursing case closure letter to patient.  Tomasa Rand, RN, BSN, CEN [facility] [employer] [phone]

## 2017-02-18 ENCOUNTER — Telehealth: Payer: Self-pay | Admitting: Cardiovascular Disease

## 2017-02-18 DIAGNOSIS — J441 Chronic obstructive pulmonary disease with (acute) exacerbation: Secondary | ICD-10-CM | POA: Diagnosis not present

## 2017-02-18 DIAGNOSIS — I5033 Acute on chronic diastolic (congestive) heart failure: Secondary | ICD-10-CM | POA: Diagnosis not present

## 2017-02-18 DIAGNOSIS — M810 Age-related osteoporosis without current pathological fracture: Secondary | ICD-10-CM | POA: Diagnosis not present

## 2017-02-18 DIAGNOSIS — Z7982 Long term (current) use of aspirin: Secondary | ICD-10-CM | POA: Diagnosis not present

## 2017-02-18 DIAGNOSIS — Z87440 Personal history of urinary (tract) infections: Secondary | ICD-10-CM | POA: Diagnosis not present

## 2017-02-18 DIAGNOSIS — G4733 Obstructive sleep apnea (adult) (pediatric): Secondary | ICD-10-CM | POA: Diagnosis not present

## 2017-02-18 DIAGNOSIS — Z9981 Dependence on supplemental oxygen: Secondary | ICD-10-CM | POA: Diagnosis not present

## 2017-02-18 NOTE — Telephone Encounter (Signed)
New Message     Elevated Heart Rate 120   90/58   Patient c/o Palpitations:  High priority if patient c/o lightheadedness and shortness of breath.  1. How long have you been having palpitations?  20 minutes ago -  Was not   2. Are you currently experiencing lightheadedness and shortness of breath? Sob all the time - no lightheadedness or dizziness   3. Have you checked your BP and heart rate? (document readings)  90/58 heart rate between 108-140   4. Are you experiencing any other symptoms? Pt was not wearing O2 - heart rate came back down to 120 from 140 when pt put oxygen on

## 2017-02-18 NOTE — Telephone Encounter (Signed)
Spoke with Estill Bamberg, nurse with [facility] home health, who called to report that patient's heart rate was elevated today during her visit.  She states upon arrival to the patient's home, the HR was 140 bpm.  She states the patient did not have her O2 on at the time and once she put her O2 on, HR came down to the 110's. She states normal HR is 90-98 bpm. She states patient has recently started 2 new inhalers. Her home health orders on managed by PCP, Dr. Jenny Reichmann.  I advised Estill Bamberg to call Dr. Jenny Reichmann because patient does not have history of tachycardia or electrical arrhythmias that are being managed by Dr. Johnsie Cancel. I reviewed resent echo results with Estill Bamberg and advised that Dr. Johnsie Cancel plans to follow the patient PRN. Estill Bamberg states patient is anxious about getting the echo results. I advised that once the results are reviewed by Dr. Johnsie Cancel, our office will call her. Estill Bamberg thanked me for the call.

## 2017-02-19 ENCOUNTER — Telehealth: Payer: Self-pay | Admitting: General Practice

## 2017-02-19 NOTE — Telephone Encounter (Signed)
Spoke with patient about Prolia.  Patient does not want Prolia at this time.  I informed her of the $220.00 co-pay and that her benefits are verfied through the end of the year.  I asked her to call the office when she is ready to take the injection.  Patient verbalized understanding.

## 2017-02-21 DIAGNOSIS — J449 Chronic obstructive pulmonary disease, unspecified: Secondary | ICD-10-CM | POA: Diagnosis not present

## 2017-02-21 DIAGNOSIS — R0902 Hypoxemia: Secondary | ICD-10-CM | POA: Diagnosis not present

## 2017-02-22 ENCOUNTER — Inpatient Hospital Stay (HOSPITAL_COMMUNITY)
Admission: EM | Admit: 2017-02-22 | Discharge: 2017-02-25 | DRG: 189 | Disposition: A | Discharge status: Home / Self Care | Payer: Medicare Other | Attending: Internal Medicine | Admitting: Internal Medicine

## 2017-02-22 ENCOUNTER — Inpatient Hospital Stay (HOSPITAL_COMMUNITY): Payer: Medicare Other

## 2017-02-22 ENCOUNTER — Other Ambulatory Visit (HOSPITAL_COMMUNITY): Payer: Self-pay

## 2017-02-22 ENCOUNTER — Other Ambulatory Visit: Payer: Self-pay

## 2017-02-22 ENCOUNTER — Emergency Department (HOSPITAL_COMMUNITY): Payer: Medicare Other

## 2017-02-22 ENCOUNTER — Encounter (HOSPITAL_COMMUNITY): Payer: Self-pay | Admitting: Emergency Medicine

## 2017-02-22 DIAGNOSIS — J9611 Chronic respiratory failure with hypoxia: Secondary | ICD-10-CM | POA: Diagnosis present

## 2017-02-22 DIAGNOSIS — R4020 Unspecified coma: Secondary | ICD-10-CM | POA: Diagnosis present

## 2017-02-22 DIAGNOSIS — J9622 Acute and chronic respiratory failure with hypercapnia: Principal | ICD-10-CM | POA: Diagnosis present

## 2017-02-22 DIAGNOSIS — Z888 Allergy status to other drugs, medicaments and biological substances status: Secondary | ICD-10-CM | POA: Diagnosis not present

## 2017-02-22 DIAGNOSIS — E785 Hyperlipidemia, unspecified: Secondary | ICD-10-CM | POA: Diagnosis present

## 2017-02-22 DIAGNOSIS — F17290 Nicotine dependence, other tobacco product, uncomplicated: Secondary | ICD-10-CM | POA: Diagnosis present

## 2017-02-22 DIAGNOSIS — Z981 Arthrodesis status: Secondary | ICD-10-CM

## 2017-02-22 DIAGNOSIS — N179 Acute kidney failure, unspecified: Secondary | ICD-10-CM | POA: Diagnosis not present

## 2017-02-22 DIAGNOSIS — E872 Acidosis: Secondary | ICD-10-CM | POA: Diagnosis not present

## 2017-02-22 DIAGNOSIS — E876 Hypokalemia: Secondary | ICD-10-CM | POA: Diagnosis not present

## 2017-02-22 DIAGNOSIS — A419 Sepsis, unspecified organism: Secondary | ICD-10-CM | POA: Diagnosis not present

## 2017-02-22 DIAGNOSIS — Z7951 Long term (current) use of inhaled steroids: Secondary | ICD-10-CM

## 2017-02-22 DIAGNOSIS — Z820 Family history of epilepsy and other diseases of the nervous system: Secondary | ICD-10-CM

## 2017-02-22 DIAGNOSIS — R069 Unspecified abnormalities of breathing: Secondary | ICD-10-CM | POA: Diagnosis not present

## 2017-02-22 DIAGNOSIS — J441 Chronic obstructive pulmonary disease with (acute) exacerbation: Secondary | ICD-10-CM | POA: Diagnosis not present

## 2017-02-22 DIAGNOSIS — H40219 Acute angle-closure glaucoma, unspecified eye: Secondary | ICD-10-CM | POA: Diagnosis not present

## 2017-02-22 DIAGNOSIS — Z720 Tobacco use: Secondary | ICD-10-CM | POA: Diagnosis present

## 2017-02-22 DIAGNOSIS — I5033 Acute on chronic diastolic (congestive) heart failure: Secondary | ICD-10-CM | POA: Diagnosis not present

## 2017-02-22 DIAGNOSIS — Z7982 Long term (current) use of aspirin: Secondary | ICD-10-CM

## 2017-02-22 DIAGNOSIS — W19XXXA Unspecified fall, initial encounter: Secondary | ICD-10-CM

## 2017-02-22 DIAGNOSIS — Z90710 Acquired absence of both cervix and uterus: Secondary | ICD-10-CM

## 2017-02-22 DIAGNOSIS — Z8261 Family history of arthritis: Secondary | ICD-10-CM

## 2017-02-22 DIAGNOSIS — Z23 Encounter for immunization: Secondary | ICD-10-CM

## 2017-02-22 DIAGNOSIS — I5030 Unspecified diastolic (congestive) heart failure: Secondary | ICD-10-CM | POA: Diagnosis not present

## 2017-02-22 DIAGNOSIS — I5032 Chronic diastolic (congestive) heart failure: Secondary | ICD-10-CM | POA: Diagnosis present

## 2017-02-22 DIAGNOSIS — F0390 Unspecified dementia without behavioral disturbance: Secondary | ICD-10-CM | POA: Diagnosis not present

## 2017-02-22 DIAGNOSIS — G3184 Mild cognitive impairment, so stated: Secondary | ICD-10-CM | POA: Diagnosis present

## 2017-02-22 DIAGNOSIS — F419 Anxiety disorder, unspecified: Secondary | ICD-10-CM | POA: Diagnosis present

## 2017-02-22 DIAGNOSIS — I248 Other forms of acute ischemic heart disease: Secondary | ICD-10-CM | POA: Diagnosis present

## 2017-02-22 DIAGNOSIS — Z6820 Body mass index (BMI) 20.0-20.9, adult: Secondary | ICD-10-CM

## 2017-02-22 DIAGNOSIS — M810 Age-related osteoporosis without current pathological fracture: Secondary | ICD-10-CM | POA: Diagnosis not present

## 2017-02-22 DIAGNOSIS — R54 Age-related physical debility: Secondary | ICD-10-CM | POA: Diagnosis present

## 2017-02-22 DIAGNOSIS — I959 Hypotension, unspecified: Secondary | ICD-10-CM | POA: Diagnosis not present

## 2017-02-22 DIAGNOSIS — R296 Repeated falls: Secondary | ICD-10-CM | POA: Diagnosis present

## 2017-02-22 DIAGNOSIS — J9621 Acute and chronic respiratory failure with hypoxia: Secondary | ICD-10-CM | POA: Diagnosis present

## 2017-02-22 DIAGNOSIS — R748 Abnormal levels of other serum enzymes: Secondary | ICD-10-CM | POA: Diagnosis not present

## 2017-02-22 DIAGNOSIS — R778 Other specified abnormalities of plasma proteins: Secondary | ICD-10-CM | POA: Diagnosis present

## 2017-02-22 DIAGNOSIS — R7989 Other specified abnormal findings of blood chemistry: Secondary | ICD-10-CM

## 2017-02-22 DIAGNOSIS — R0602 Shortness of breath: Secondary | ICD-10-CM | POA: Diagnosis not present

## 2017-02-22 DIAGNOSIS — R079 Chest pain, unspecified: Secondary | ICD-10-CM | POA: Diagnosis present

## 2017-02-22 DIAGNOSIS — E440 Moderate protein-calorie malnutrition: Secondary | ICD-10-CM | POA: Diagnosis not present

## 2017-02-22 DIAGNOSIS — J9612 Chronic respiratory failure with hypercapnia: Secondary | ICD-10-CM

## 2017-02-22 DIAGNOSIS — J439 Emphysema, unspecified: Secondary | ICD-10-CM | POA: Diagnosis not present

## 2017-02-22 DIAGNOSIS — G4733 Obstructive sleep apnea (adult) (pediatric): Secondary | ICD-10-CM | POA: Diagnosis present

## 2017-02-22 DIAGNOSIS — F329 Major depressive disorder, single episode, unspecified: Secondary | ICD-10-CM | POA: Diagnosis present

## 2017-02-22 DIAGNOSIS — Z9981 Dependence on supplemental oxygen: Secondary | ICD-10-CM

## 2017-02-22 DIAGNOSIS — E869 Volume depletion, unspecified: Secondary | ICD-10-CM | POA: Diagnosis present

## 2017-02-22 LAB — COMPREHENSIVE METABOLIC PANEL
ALT: 22 U/L (ref 14–54)
AST: 31 U/L (ref 15–41)
Albumin: 4 g/dL (ref 3.5–5.0)
Alkaline Phosphatase: 62 U/L (ref 38–126)
Anion gap: 22 — ABNORMAL HIGH (ref 5–15)
BUN: 31 mg/dL — AB (ref 6–20)
CO2: 41 mmol/L — AB (ref 22–32)
CREATININE: 1.15 mg/dL — AB (ref 0.44–1.00)
Calcium: 9.7 mg/dL (ref 8.9–10.3)
Chloride: 69 mmol/L — ABNORMAL LOW (ref 101–111)
GFR calc Af Amer: 54 mL/min — ABNORMAL LOW (ref >60)
GFR calc non Af Amer: 46 mL/min — ABNORMAL LOW (ref >60)
GLUCOSE: 209 mg/dL — AB (ref 65–99)
Potassium: 2.2 mmol/L — CL (ref 3.5–5.1)
SODIUM: 132 mmol/L — AB (ref 135–145)
Total Bilirubin: 1.4 mg/dL — ABNORMAL HIGH (ref 0.3–1.2)
Total Protein: 6.7 g/dL (ref 6.5–8.1)

## 2017-02-22 LAB — CBC WITH DIFFERENTIAL/PLATELET
BASOS PCT: 0 %
Basophils Absolute: 0 10*3/uL (ref 0.0–0.1)
Eosinophils Absolute: 0 10*3/uL (ref 0.0–0.7)
Eosinophils Relative: 0 %
HCT: 45.5 % (ref 36.0–46.0)
Hemoglobin: 16.3 g/dL — ABNORMAL HIGH (ref 12.0–15.0)
LYMPHS ABS: 1.6 10*3/uL (ref 0.7–4.0)
Lymphocytes Relative: 6 %
MCH: 33.9 pg (ref 26.0–34.0)
MCHC: 35.8 g/dL (ref 30.0–36.0)
MCV: 94.6 fL (ref 78.0–100.0)
MONO ABS: 1.4 10*3/uL — AB (ref 0.1–1.0)
Monocytes Relative: 5 %
Neutro Abs: 24.4 10*3/uL — ABNORMAL HIGH (ref 1.7–7.7)
Neutrophils Relative %: 89 %
PLATELETS: 400 10*3/uL (ref 150–400)
RBC: 4.81 MIL/uL (ref 3.87–5.11)
RDW: 12.8 % (ref 11.5–15.5)
WBC: 27.4 10*3/uL — ABNORMAL HIGH (ref 4.0–10.5)

## 2017-02-22 LAB — URINALYSIS, ROUTINE W REFLEX MICROSCOPIC
BILIRUBIN URINE: NEGATIVE
GLUCOSE, UA: 50 mg/dL — AB
KETONES UR: NEGATIVE mg/dL
NITRITE: NEGATIVE
PROTEIN: NEGATIVE mg/dL
Specific Gravity, Urine: 1.01 (ref 1.005–1.030)
pH: 6 (ref 5.0–8.0)

## 2017-02-22 LAB — I-STAT CG4 LACTIC ACID, ED: LACTIC ACID, VENOUS: 4.44 mmol/L — AB (ref 0.5–1.9)

## 2017-02-22 LAB — MRSA PCR SCREENING: MRSA BY PCR: NEGATIVE

## 2017-02-22 LAB — LACTIC ACID, PLASMA: LACTIC ACID, VENOUS: 7.1 mmol/L — AB (ref 0.5–1.9)

## 2017-02-22 LAB — MAGNESIUM: Magnesium: 2.2 mg/dL (ref 1.7–2.4)

## 2017-02-22 LAB — BLOOD GAS, VENOUS
Acid-Base Excess: 19.5 mmol/L — ABNORMAL HIGH (ref 0.0–2.0)
Bicarbonate: 46.6 mmol/L — ABNORMAL HIGH (ref 20.0–28.0)
O2 SAT: 92.9 %
PCO2 VEN: 54.2 mmHg (ref 44.0–60.0)
Patient temperature: 98.6
pH, Ven: 7.544 — ABNORMAL HIGH (ref 7.250–7.430)
pO2, Ven: 65.8 mmHg — ABNORMAL HIGH (ref 32.0–45.0)

## 2017-02-22 LAB — I-STAT TROPONIN, ED: Troponin i, poc: 0 ng/mL (ref 0.00–0.08)

## 2017-02-22 LAB — PHOSPHORUS: PHOSPHORUS: 3.7 mg/dL (ref 2.5–4.6)

## 2017-02-22 LAB — TROPONIN I: Troponin I: 0.03 ng/mL (ref <0.03)

## 2017-02-22 MED ORDER — AZITHROMYCIN 250 MG PO TABS
500.0000 mg | ORAL_TABLET | Freq: Every day | ORAL | Status: DC
  Administered 2017-02-23: 500 mg via ORAL
  Filled 2017-02-22: qty 2

## 2017-02-22 MED ORDER — ROFLUMILAST 500 MCG PO TABS
500.0000 ug | ORAL_TABLET | Freq: Every day | ORAL | Status: DC
  Administered 2017-02-23 – 2017-02-24 (×2): 500 ug via ORAL
  Filled 2017-02-22 (×2): qty 1

## 2017-02-22 MED ORDER — DEXTROSE 5 % IV SOLN
1.0000 g | Freq: Once | INTRAVENOUS | Status: AC
  Administered 2017-02-22: 1 g via INTRAVENOUS
  Filled 2017-02-22: qty 10

## 2017-02-22 MED ORDER — DONEPEZIL HCL 10 MG PO TABS
10.0000 mg | ORAL_TABLET | Freq: Every day | ORAL | Status: DC
  Administered 2017-02-22 – 2017-02-24 (×3): 10 mg via ORAL
  Filled 2017-02-22 (×3): qty 1

## 2017-02-22 MED ORDER — HEPARIN SODIUM (PORCINE) 5000 UNIT/ML IJ SOLN
5000.0000 [IU] | Freq: Three times a day (TID) | INTRAMUSCULAR | Status: DC
  Administered 2017-02-22 – 2017-02-23 (×2): 5000 [IU] via SUBCUTANEOUS
  Filled 2017-02-22 (×2): qty 1

## 2017-02-22 MED ORDER — ATORVASTATIN CALCIUM 20 MG PO TABS
20.0000 mg | ORAL_TABLET | Freq: Every day | ORAL | Status: DC
  Administered 2017-02-22 – 2017-02-24 (×3): 20 mg via ORAL
  Filled 2017-02-22 (×2): qty 2
  Filled 2017-02-22: qty 1

## 2017-02-22 MED ORDER — MAGNESIUM SULFATE 2 GM/50ML IV SOLN
2.0000 g | Freq: Once | INTRAVENOUS | Status: DC
  Filled 2017-02-22: qty 50

## 2017-02-22 MED ORDER — POTASSIUM CHLORIDE 10 MEQ/100ML IV SOLN
10.0000 meq | INTRAVENOUS | Status: AC
  Administered 2017-02-22 (×3): 10 meq via INTRAVENOUS
  Filled 2017-02-22 (×5): qty 100

## 2017-02-22 MED ORDER — ALBUTEROL (5 MG/ML) CONTINUOUS INHALATION SOLN
10.0000 mg/h | INHALATION_SOLUTION | RESPIRATORY_TRACT | Status: DC
  Administered 2017-02-22: 10 mg/h via RESPIRATORY_TRACT
  Filled 2017-02-22: qty 20

## 2017-02-22 MED ORDER — FAMOTIDINE 20 MG PO TABS
20.0000 mg | ORAL_TABLET | Freq: Two times a day (BID) | ORAL | Status: DC
  Administered 2017-02-22 – 2017-02-25 (×6): 20 mg via ORAL
  Filled 2017-02-22 (×6): qty 1

## 2017-02-22 MED ORDER — AZITHROMYCIN 250 MG PO TABS
500.0000 mg | ORAL_TABLET | Freq: Once | ORAL | Status: AC
  Administered 2017-02-22: 500 mg via ORAL
  Filled 2017-02-22: qty 2

## 2017-02-22 MED ORDER — SODIUM CHLORIDE 0.9 % IV BOLUS (SEPSIS)
500.0000 mL | Freq: Once | INTRAVENOUS | Status: AC
  Administered 2017-02-22: 500 mL via INTRAVENOUS

## 2017-02-22 MED ORDER — DEXTROSE 5 % IV SOLN: 1.0000 g | Freq: Once | INTRAVENOUS | Status: DC

## 2017-02-22 MED ORDER — GUAIFENESIN ER 600 MG PO TB12
600.0000 mg | ORAL_TABLET | Freq: Two times a day (BID) | ORAL | Status: DC
  Administered 2017-02-22 – 2017-02-25 (×6): 600 mg via ORAL
  Filled 2017-02-22 (×6): qty 1

## 2017-02-22 MED ORDER — GABAPENTIN 100 MG PO CAPS
100.0000 mg | ORAL_CAPSULE | Freq: Three times a day (TID) | ORAL | Status: DC
  Administered 2017-02-22 – 2017-02-25 (×8): 100 mg via ORAL
  Filled 2017-02-22 (×8): qty 1

## 2017-02-22 MED ORDER — LEVALBUTEROL HCL 0.63 MG/3ML IN NEBU
0.6300 mg | INHALATION_SOLUTION | Freq: Three times a day (TID) | RESPIRATORY_TRACT | Status: DC
  Administered 2017-02-23 (×2): 0.63 mg via RESPIRATORY_TRACT
  Filled 2017-02-22 (×2): qty 3

## 2017-02-22 MED ORDER — SODIUM CHLORIDE 0.9 % IV BOLUS (SEPSIS)
1000.0000 mL | Freq: Once | INTRAVENOUS | Status: AC
  Administered 2017-02-22: 1000 mL via INTRAVENOUS

## 2017-02-22 MED ORDER — POTASSIUM CHLORIDE CRYS ER 20 MEQ PO TBCR
40.0000 meq | EXTENDED_RELEASE_TABLET | Freq: Once | ORAL | Status: AC
  Administered 2017-02-22: 40 meq via ORAL
  Filled 2017-02-22: qty 2

## 2017-02-22 MED ORDER — POTASSIUM CHLORIDE IN NACL 40-0.9 MEQ/L-% IV SOLN
INTRAVENOUS | Status: DC
  Administered 2017-02-23: 88 mL/h via INTRAVENOUS
  Filled 2017-02-22 (×2): qty 1000

## 2017-02-22 MED ORDER — ONDANSETRON HCL 4 MG PO TABS: 4.0000 mg | ORAL_TABLET | Freq: Four times a day (QID) | ORAL | Status: DC | PRN

## 2017-02-22 MED ORDER — IPRATROPIUM BROMIDE 0.02 % IN SOLN
0.5000 mg | Freq: Four times a day (QID) | RESPIRATORY_TRACT | Status: DC
  Administered 2017-02-22: 0.5 mg via RESPIRATORY_TRACT

## 2017-02-22 MED ORDER — LEVALBUTEROL HCL 0.63 MG/3ML IN NEBU
0.6300 mg | INHALATION_SOLUTION | Freq: Four times a day (QID) | RESPIRATORY_TRACT | Status: DC
  Administered 2017-02-22: 0.63 mg via RESPIRATORY_TRACT

## 2017-02-22 MED ORDER — IPRATROPIUM BROMIDE 0.02 % IN SOLN
0.5000 mg | Freq: Three times a day (TID) | RESPIRATORY_TRACT | Status: DC
  Administered 2017-02-23 (×2): 0.5 mg via RESPIRATORY_TRACT
  Filled 2017-02-22 (×2): qty 2.5

## 2017-02-22 MED ORDER — ASPIRIN EC 81 MG PO TBEC
81.0000 mg | DELAYED_RELEASE_TABLET | Freq: Every day | ORAL | Status: DC
  Administered 2017-02-22 – 2017-02-24 (×3): 81 mg via ORAL
  Filled 2017-02-22 (×3): qty 1

## 2017-02-22 MED ORDER — POTASSIUM CHLORIDE IN NACL 20-0.9 MEQ/L-% IV SOLN
INTRAVENOUS | Status: DC
  Administered 2017-02-22: 23:00:00 via INTRAVENOUS
  Filled 2017-02-22 (×2): qty 1000

## 2017-02-22 MED ORDER — IPRATROPIUM BROMIDE 0.02 % IN SOLN
0.5000 mg | Freq: Once | RESPIRATORY_TRACT | Status: AC
  Administered 2017-02-22: 0.5 mg via RESPIRATORY_TRACT
  Filled 2017-02-22: qty 2.5

## 2017-02-22 MED ORDER — DEXTROSE 5 % IV SOLN
1.0000 g | INTRAVENOUS | Status: DC
  Administered 2017-02-23: 1 g via INTRAVENOUS
  Filled 2017-02-22: qty 10

## 2017-02-22 MED ORDER — ONDANSETRON HCL 4 MG/2ML IJ SOLN: 4.0000 mg | Freq: Four times a day (QID) | INTRAMUSCULAR | Status: DC | PRN

## 2017-02-22 MED ORDER — METHYLPREDNISOLONE SODIUM SUCC 40 MG IJ SOLR
40.0000 mg | Freq: Four times a day (QID) | INTRAMUSCULAR | Status: DC
  Administered 2017-02-23 – 2017-02-24 (×6): 40 mg via INTRAVENOUS
  Filled 2017-02-22 (×6): qty 1

## 2017-02-22 MED ORDER — MAGNESIUM SULFATE 2 GM/50ML IV SOLN
2.0000 g | Freq: Once | INTRAVENOUS | Status: AC
  Administered 2017-02-22: 2 g via INTRAVENOUS
  Filled 2017-02-22: qty 50

## 2017-02-22 MED ORDER — ESCITALOPRAM OXALATE 10 MG PO TABS
10.0000 mg | ORAL_TABLET | Freq: Every day | ORAL | Status: DC
  Administered 2017-02-23 – 2017-02-25 (×3): 10 mg via ORAL
  Filled 2017-02-22 (×3): qty 1

## 2017-02-22 MED ORDER — LEVALBUTEROL HCL 0.63 MG/3ML IN NEBU
0.6300 mg | INHALATION_SOLUTION | Freq: Four times a day (QID) | RESPIRATORY_TRACT | Status: DC | PRN
  Filled 2017-02-22: qty 3

## 2017-02-22 MED ORDER — MONTELUKAST SODIUM 10 MG PO TABS
10.0000 mg | ORAL_TABLET | Freq: Every day | ORAL | Status: DC
  Administered 2017-02-22 – 2017-02-24 (×3): 10 mg via ORAL
  Filled 2017-02-22 (×3): qty 1

## 2017-02-22 MED ORDER — LORAZEPAM 2 MG/ML IJ SOLN
0.5000 mg | INTRAMUSCULAR | Status: DC | PRN
  Administered 2017-02-24: 0.5 mg via INTRAVENOUS
  Filled 2017-02-22: qty 1

## 2017-02-22 MED ORDER — TETANUS-DIPHTHERIA TOXOIDS TD 5-2 LFU IM INJ
0.5000 mL | INJECTION | Freq: Once | INTRAMUSCULAR | Status: AC
  Administered 2017-02-22: 0.5 mL via INTRAMUSCULAR
  Filled 2017-02-22: qty 0.5

## 2017-02-22 NOTE — ED Provider Notes (Signed)
[facility] DEPT Provider Note   CSN: [id number] Arrival date & time: 02/22/17  1550     History   Chief Complaint Chief Complaint  Patient presents with  . Fall    HPI DOREEN GARRETSON is a 71 y.o. female.  The history is provided by the patient. No language interpreter was used.  Fall     HARBOUR NORDMEYER is a 71 y.o. female who presents to the Emergency Department complaining of fall. She presents from home via EMS for multiple falls and chest discomfort. She reports multiple, frequent falls lately, landing on her knees. She denies any head injury. She reports significant chest discomfort. She has chronic shortness of breath but this significantly worsened over the last day. She denies any fevers, chest pain. She does have a cough. No vomiting, abdominal pain, leg swelling or pain.  Past Medical History:  Diagnosis Date  . Acute angle-closure glaucoma 02/14/2010  . ALLERGIC RHINITIS 09/19/2009  . ANXIETY 04/15/2010  . Arthritis   . ASTHMA 09/19/2009  . BACK PAIN 02/14/2010  . Chronic bronchitis 04/14/2011  . CHRONIC OBSTRUCTIVE PULMONARY DISEASE, ACUTE EXACERBATION 12/19/2009  . Complication of anesthesia   . COPD 06/27/2009  . DEPRESSION 09/19/2009  . HOARSENESS 10/17/2009  . HYPERLIPIDEMIA 09/19/2009  . MENOPAUSAL DISORDER 08/20/2010  . OSTEOPENIA 09/10/2010  . Osteoporosis, unspecified 04/20/2014  . Rotator cuff tear 06/23/2011  . TINNITUS 09/10/2010  . TREMOR 02/14/2010  . Tremor 07/30/2011  . URI 09/10/2010  . Wheezing 06/27/2009    Patient Active Problem List   Diagnosis Date Noted  . Acute on chronic respiratory failure ([place]) 02/22/2017  . Hypokalemia 02/22/2017  . Diastolic CHF ([place]) 48/18/5631  . Chronic respiratory failure with hypoxia and hypercapnia ([place]) 01/13/2017  . CAP (community acquired pneumonia) 12/05/2016  . Severe sepsis with septic shock ([place]) 12/05/2016  . Acute lower UTI 12/05/2016  . Acute respiratory failure with hypoxia ([place]) 12/05/2016   . COPD with acute exacerbation ([place]) 12/05/2016  . Abnormal TSH 10/28/2016  . B12 deficiency 09/18/2016  . Abnormal finding on screening procedure 09/18/2016  . Risk for falls 08/27/2016  . Protein-calorie malnutrition, severe 08/06/2016  . COPD, group D, by GOLD 2017 classification ([place]) 08/05/2016  . Acute respiratory failure with hypoxia and hypercapnia ([place]) 07/26/2016  . Grief reaction 05/15/2016  . Rotator cuff tear arthropathy of both shoulders 04/17/2016  . Neck pain on right side 03/13/2016  . Angioedema 10/23/2015  . Cough 09/07/2015  . Benign neoplasm of meninges ([place]) 08/29/2015  . Dizziness and giddiness 08/29/2015  . Mild cognitive impairment 08/10/2015  . Benign paroxysmal positional vertigo 08/10/2015  . Hyperlipidemia 08/10/2015  . Memory dysfunction 07/03/2015  . Solitary pulmonary nodule 05/09/2015  . Right hip pain 01/23/2015  . Chest pain 10/25/2014  . Right sided sciatica 10/11/2014  . Chronic pain syndrome 10/11/2014  . Osteoporosis 04/20/2014  . COPD exacerbation ([place]) 08/25/2013  . General weakness 12/22/2012  . Personal history of colonic polyps 10/25/2012  . Anterior neck pain 10/25/2012  . COPD GOLD III/ still smoking  08/24/2012  . Shingles 07/14/2012  . Orthostatic hypotension 05/07/2012  . Involuntary movements 11/19/2011  . Tremor 07/30/2011  . Rotator cuff tear 06/23/2011  . Lower back pain 06/23/2011  . Balance problem 06/23/2011  . Tobacco abuse 05/22/2011  . Preop exam for internal medicine 05/13/2011  . Chronic bronchitis ([place]) 04/14/2011  . Peripheral edema 01/13/2011  . Right shoulder pain 01/13/2011  . Fatigue 12/03/2010  . Encounter for well adult  exam with abnormal findings 10/31/2010  . TINNITUS 09/10/2010  . MENOPAUSAL DISORDER 08/20/2010  . Anxiety and depression 04/15/2010  . Acute angle-closure glaucoma 02/14/2010  . HOARSENESS 10/17/2009  . Allergic rhinitis 09/19/2009    Past Surgical History:  Procedure  Laterality Date  . ABDOMINAL HYSTERECTOMY    . cervical fusion 2013 - Dr Vertell Limber    . Normal Heart Cath  2001   Dr. Johnsie Cancel  . s/p laser tx for glaucoma     no vision loss    OB History    No data available       Home Medications    Prior to Admission medications   Medication Sig Start Date End Date Taking? Authorizing Provider  albuterol (PROVENTIL HFA) 108 (90 Base) MCG/ACT inhaler Inhale 2 puffs into the lungs every 6 (six) hours as needed for wheezing or shortness of breath. 02/06/17  Yes Biagio Borg, MD  albuterol (PROVENTIL) (2.5 MG/3ML) 0.083% nebulizer solution Take 3 mLs (2.5 mg total) by nebulization every 6 (six) hours as needed for wheezing or shortness of breath. 02/10/17  Yes Biagio Borg, MD  aspirin EC 81 MG tablet Take 81 mg by mouth at bedtime.   Yes [provider]  atorvastatin (LIPITOR) 20 MG tablet Take 1 tablet (20 mg total) by mouth at bedtime. 02/06/17  Yes Biagio Borg, MD  b complex vitamins tablet Take 1 tablet by mouth at bedtime.   Yes [provider]  budesonide-formoterol (SYMBICORT) 160-4.5 MCG/ACT inhaler Inhale 2 puffs into the lungs 2 (two) times daily. 02/12/16  Yes Biagio Borg, MD  clonazePAM (KLONOPIN) 0.5 MG tablet Take 1 tablet (0.5 mg total) by mouth 2 (two) times daily as needed for anxiety. 08/20/16  Yes Biagio Borg, MD  donepezil (ARICEPT) 10 MG tablet Take 1 tablet (10 mg total) by mouth at bedtime. 12/12/16  Yes Patrecia Pour, Christean Grief, MD  escitalopram (LEXAPRO) 20 MG tablet Take 0.5 tablets (10 mg total) by mouth daily. 08/20/16  Yes Biagio Borg, MD  fluticasone furoate-vilanterol (BREO ELLIPTA) 200-25 MCG/INH AEPB Inhale 1 puff into the lungs daily. 02/09/17  Yes Mannam, Praveen, MD  furosemide (LASIX) 40 MG tablet Take 1 tablet (40 mg total) by mouth daily. 01/13/17  Yes Biagio Borg, MD  gabapentin (NEURONTIN) 100 MG capsule TAKE 1 CAPSULE BY MOUTH THREE TIMES DAILY 01/16/17  Yes Biagio Borg, MD  hydrochlorothiazide  (HYDRODIURIL) 25 MG tablet Take 25 mg by mouth daily.   Yes [provider]  montelukast (SINGULAIR) 10 MG tablet TAKE 1 TABLET BY MOUTH EVERY NIGHT AT BEDTIME 03/26/16  Yes Biagio Borg, MD  potassium chloride (K-DUR) 10 MEQ tablet Take 1 tablet (10 mEq total) by mouth daily. 01/14/17  Yes Biagio Borg, MD  predniSONE (DELTASONE) 10 MG tablet 4 tabs x3 days, 3 tabs x 3 days, 2 tabs x 3 days, 1 tabs x 3 days then stop 02/09/17  Yes Mannam, Praveen, MD  Probiotic Product (PROBIOTIC PO) Take 1 capsule by mouth daily.   Yes [provider]  roflumilast (DALIRESP) 500 MCG TABS tablet Take 1 tablet (500 mcg total) by mouth daily. 02/09/17  Yes Mannam, Praveen, MD  tiotropium (SPIRIVA HANDIHALER) 18 MCG inhalation capsule Place 1 capsule (18 mcg total) into inhaler and inhale daily. 10/28/16  Yes Biagio Borg, MD  umeclidinium bromide (INCRUSE ELLIPTA) 62.5 MCG/INH AEPB Inhale 1 puff into the lungs daily. 02/09/17  Yes Marshell Garfinkel, MD  dextromethorphan-guaiFENesin [facility]  DM) 30-600 MG 12hr tablet Take 2 tablets by mouth 2 (two) times daily. Patient not taking: Reported on 02/22/2017 07/28/16   Barton Dubois, MD  Roflumilast (DALIRESP) 250 MCG TABS Take 250 mg by mouth daily. 02/09/17 02/10/17  Mannam, Hart Robinsons, MD  roflumilast (DALIRESP) 500 MCG TABS tablet Take 1 tablet (500 mcg total) by mouth daily. 02/09/17 02/10/17  Marshell Garfinkel, MD    Family History Family History  Problem Relation Age of Onset  . Dementia Mother   . Rheum arthritis Maternal Uncle   . Colon cancer Neg Hx     Social History Social History  Substance Use Topics  . Smoking status: Current Some Day Smoker    Packs/day: 0.50    Years: 50.00    Types: Cigarettes    Start date: 07/03/2015  . Smokeless tobacco: Never Used     Comment: pt smokes 2 cigarettes daily-02/09/17  . Alcohol use No     Allergies   Pravastatin; Simvastatin; and Statins   Review of Systems Review of Systems  All other systems  reviewed and are negative.    Physical Exam Updated Vital Signs BP (!) 88/42   Pulse 79   Temp 98.2 F (36.8 C) (Oral)   Resp 13   SpO2 100%   Physical Exam  Constitutional: She is oriented to person, place, and time. She appears well-developed and well-nourished.  HENT:  Head: Normocephalic and atraumatic.  Cardiovascular: Regular rhythm.   No murmur heard. Tachycardic   Pulmonary/Chest: She is in respiratory distress.  Tachypneic, decreased air movement bilaterally with occasional end expiratory wheezes and accessory muscle use  Abdominal: Soft. There is no tenderness. There is no rebound and no guarding.  Musculoskeletal:  Abrasions to bilateral knees with no significant tenderness.  Neurological: She is alert and oriented to person, place, and time.  Skin: Skin is warm and dry.  Psychiatric: She has a normal mood and affect. Her behavior is normal.  Nursing note and vitals reviewed.    ED Treatments / Results  Labs (all labs ordered are listed, but only abnormal results are displayed) Labs Reviewed  COMPREHENSIVE METABOLIC PANEL - Abnormal; Notable for the following:       Result Value   Sodium 132 (*)    Potassium 2.2 (*)    Chloride 69 (*)    CO2 41 (*)    Glucose, Bld 209 (*)    BUN 31 (*)    Creatinine, Ser 1.15 (*)    Total Bilirubin 1.4 (*)    GFR calc non Af Amer 46 (*)    GFR calc Af Amer 54 (*)    Anion gap 22 (*)    All other components within normal limits  CBC WITH DIFFERENTIAL/PLATELET - Abnormal; Notable for the following:    WBC 27.4 (*)    Hemoglobin 16.3 (*)    Neutro Abs 24.4 (*)    Monocytes Absolute 1.4 (*)    All other components within normal limits  URINALYSIS, ROUTINE W REFLEX MICROSCOPIC - Abnormal; Notable for the following:    Glucose, UA 50 (*)    Hgb urine dipstick SMALL (*)    Leukocytes, UA TRACE (*)    Bacteria, UA RARE (*)    Squamous Epithelial / LPF 0-5 (*)    All other components within normal limits  BLOOD GAS,  VENOUS - Abnormal; Notable for the following:    pH, Ven 7.544 (*)    pO2, Ven 65.8 (*)    Bicarbonate 46.6 (*)  Acid-Base Excess 19.5 (*)    All other components within normal limits  LACTIC ACID, PLASMA - Abnormal; Notable for the following:    Lactic Acid, Venous 7.1 (*)    All other components within normal limits  TROPONIN I - Abnormal; Notable for the following:    Troponin I 0.03 (*)    All other components within normal limits  I-STAT CG4 LACTIC ACID, ED - Abnormal; Notable for the following:    Lactic Acid, Venous 4.44 (*)    All other components within normal limits  MRSA PCR SCREENING  CULTURE, BLOOD (ROUTINE X 2)  CULTURE, BLOOD (ROUTINE X 2)  MAGNESIUM  PHOSPHORUS  LACTIC ACID, PLASMA  COMPREHENSIVE METABOLIC PANEL  CBC WITH DIFFERENTIAL/PLATELET  TROPONIN I  TROPONIN I  I-STAT TROPONIN, ED    EKG  EKG Interpretation None       Radiology Ct Head Wo Contrast  Result Date: 02/22/2017 CLINICAL DATA:  Frequent falls. EXAM: CT HEAD WITHOUT CONTRAST TECHNIQUE: Contiguous axial images were obtained from the base of the skull through the vertex without intravenous contrast. COMPARISON:  MRI of the brain 06/20/2015 FINDINGS: Brain: No evidence of acute infarction, hemorrhage, hydrocephalus, extra-axial collection or mass lesion/mass effect. Moderate brain parenchymal volume loss and microangiopathy. Slight enlargement of the calcified right parafalcine anterior frontal meningioma, which now measures 18 by 11 by 18 mm, prior measurement 15 x 11 x 16 mm, 06/20/2015. Vascular: Calcific atherosclerotic disease at the skullbase. Skull: Normal. Negative for fracture or focal lesion. Sinuses/Orbits: No acute finding. Other: None. IMPRESSION: No acute intracranial abnormality. Moderate brain parenchymal atrophy and chronic microvascular disease. Slight enlargement of calcified right parafalcine anterior frontal meningioma, which now measures 18 mm in greatest dimension.  Electronically Signed   By: Fidela Salisbury M.D.   On: 02/22/2017 21:07   Dg Chest Port 1 View  Result Date: 02/22/2017 CLINICAL DATA:  Shortness of breath for 3 weeks.  History of asthma. EXAM: PORTABLE CHEST 1 VIEW COMPARISON:  01/13/2017 FINDINGS: Cardiomediastinal silhouette is normal. Mediastinal contours appear intact. Calcific atherosclerotic disease and tortuosity of the aorta. There is no evidence of focal airspace consolidation, pleural effusion or pneumothorax. Upper lobe predominant emphysema. Osseous structures are without acute abnormality. Partially visualized cervical spine fusion. Soft tissues are grossly normal. IMPRESSION: Upper lobe predominant emphysema. Calcific atherosclerotic disease of the aorta. No evidence of focal airspace consolidation. Electronically Signed   By: Fidela Salisbury M.D.   On: 02/22/2017 16:55    Procedures Procedures (including critical care time) CRITICAL CARE Performed by: Quintella Reichert   Total critical care time: 35 minutes  Critical care time was exclusive of separately billable procedures and treating other patients.  Critical care was necessary to treat or prevent imminent or life-threatening deterioration.  Critical care was time spent personally by me on the following activities: development of treatment plan with patient and/or surrogate as well as nursing, discussions with consultants, evaluation of patient's response to treatment, examination of patient, obtaining history from patient or surrogate, ordering and performing treatments and interventions, ordering and review of laboratory studies, ordering and review of radiographic studies, pulse oximetry and re-evaluation of patient's condition.  Medications Ordered in ED Medications  aspirin EC tablet 81 mg (81 mg Oral Given 02/22/17 2257)  atorvastatin (LIPITOR) tablet 20 mg (20 mg Oral Given 02/22/17 2257)  donepezil (ARICEPT) tablet 10 mg (10 mg Oral Given 02/22/17 2257)    escitalopram (LEXAPRO) tablet 10 mg (not administered)  gabapentin (NEURONTIN) capsule 100 mg (100 mg  Oral Given 02/22/17 2257)  montelukast (SINGULAIR) tablet 10 mg (10 mg Oral Given 02/22/17 2257)  roflumilast (DALIRESP) tablet 500 mcg (not administered)  heparin injection 5,000 Units (5,000 Units Subcutaneous Given 02/22/17 2256)  0.9 % NaCl with KCl 40 mEq / L  infusion (not administered)  ondansetron (ZOFRAN) tablet 4 mg (not administered)    Or  ondansetron (ZOFRAN) injection 4 mg (not administered)  levalbuterol (XOPENEX) nebulizer solution 0.63 mg (not administered)  guaiFENesin (MUCINEX) 12 hr tablet 600 mg (600 mg Oral Given 02/22/17 2257)  famotidine (PEPCID) tablet 20 mg (20 mg Oral Given 02/22/17 2257)  methylPREDNISolone sodium succinate (SOLU-MEDROL) 40 mg/mL injection 40 mg (40 mg Intravenous Given 02/23/17 0006)  LORazepam (ATIVAN) injection 0.5 mg (not administered)  azithromycin (ZITHROMAX) tablet 500 mg (not administered)  0.9 % NaCl with KCl 20 mEq/ L  infusion ( Intravenous New Bag/Given 02/22/17 2317)  cefTRIAXone (ROCEPHIN) 1 g in dextrose 5 % 50 mL IVPB (not administered)  ipratropium (ATROVENT) nebulizer solution 0.5 mg (not administered)  levalbuterol (XOPENEX) nebulizer solution 0.63 mg (not administered)  ipratropium (ATROVENT) nebulizer solution 0.5 mg (0.5 mg Nebulization Given 02/22/17 1635)  sodium chloride 0.9 % bolus 500 mL (0 mLs Intravenous Stopped 02/22/17 1910)  cefTRIAXone (ROCEPHIN) 1 g in dextrose 5 % 50 mL IVPB (0 g Intravenous Stopped 02/22/17 1841)  azithromycin (ZITHROMAX) tablet 500 mg (500 mg Oral Given 02/22/17 1828)  potassium chloride 10 mEq in 100 mL IVPB (0 mEq Intravenous Stopped 02/22/17 2226)  potassium chloride SA (K-DUR,KLOR-CON) CR tablet 40 mEq (40 mEq Oral Given 02/22/17 1828)  sodium chloride 0.9 % bolus 1,000 mL (0 mLs Intravenous Stopped 02/22/17 1909)    And  sodium chloride 0.9 % bolus 500 mL (0 mLs Intravenous Stopped 02/22/17 1911)   tetanus & diphtheria toxoids (adult) (TENIVAC) injection 0.5 mL (0.5 mLs Intramuscular Given 02/22/17 2258)  magnesium sulfate IVPB 2 g 50 mL (2 g Intravenous Transfusing/Transfer 02/22/17 2142)  sodium chloride 0.9 % bolus 500 mL (0 mLs Intravenous Stopped 02/22/17 2330)     Initial Impression / Assessment and Plan / ED Course  I have reviewed the triage vital signs and the nursing notes.  Pertinent labs & imaging results that were available during my care of the patient were reviewed by me and considered in my medical decision making (see chart for details).   patient here for shortness of breath, frequent falls. Patient ill-appearing on examination with tachypnea and accessory muscle use. Following treatment with albuterol she appears improved. CBC with significant leukocytosis, no reports of fever or infiltrate on x ray but given her symptoms will treat empirically for possible pneumonia. She was given IV fluids and antibiotics. EKG with QT prolongation, azithromycin chosen over Levaquin. BMP demonstrates hypokalemia, will replace IV and orally. Patient updated findings of studies and recommendation for admission for further treatment and she is in agreement with plan. Hospitalist consulted for admission for further treatment.  Final Clinical Impressions(s) / ED Diagnoses   Final diagnoses:  Fall, initial encounter  Sepsis, due to unspecified organism [facility])    New Prescriptions Current Discharge Medication List       Quintella Reichert, MD 02/23/17 [phone]

## 2017-02-22 NOTE — ED Notes (Signed)
Bed: ZE09 Expected date:  Expected time:  Means of arrival:  Comments: 71 yo fall

## 2017-02-22 NOTE — H&P (Signed)
History and Physical    Michele Martin BHA:[id number] DOB: 01-21-1946 DOA: 02/22/2017  PCP: Biagio Borg, MD   Patient coming from: Home.  I have personally briefly reviewed patient's old medical records in [place]  Chief Complaint: Shortness of breath.  HPI: Michele Martin is a 71 y.o. female with medical history significant of acute angle closure glaucoma, allergic rhinitis, sinusitis, osteoarthritis, asthma, chronic back pain, COPD, anxiety/depression, hyperlipidemia, osteopenia, tremor who is brought to the emergency department via EMS after having multiple falls today, worsening dyspnea, greenish sputum production cough and wheezing for the past 2 days. She was recently treated for an UTI. She denies fever, but complains of chills and fatigue. She complains of pleuritic chest pain and palpitations, but denies nausea, dizziness, recent PND, orthopnea or pitting edema. She says that she has been compliant with her furosemide and home oxygen. She denies abdominal pain, diarrhea, melena, hematochezia or constipation. She denies dysuria, frequency or hematuria.  ED Course: The patient received Solu-Medrol 125 mg IVP, a 10 mg albuterol plus ipratropium 1 mg nebulizer treatment in route to the hospital. Her initial vital signs in the hospital temperature 97.71F, pulse 111, blood pressure 100/67 mmHg, respirations 13 and O2 sat 100% on nasal cannula oxygen. She was given a 2500 mL normal saline bolus in the ED. Her WBC was 27.4 with 89% neutrophils, hemoglobin 16.3 g/dL and platelets 400. Sodium 132, potassium 2.2, chloride 69, bicarbonate 41 and lactic acid 4.44 mmol/L. BUN 31, creatinine 1.15, glucose 209 and magnesium 2.2 mg/dL. A chest radiograph does not show acute infiltrate, but shows upper lobes dominant emphysema.  Review of Systems: As per HPI otherwise 10 point review of systems negative.    Past Medical History:  Diagnosis Date  . Acute angle-closure glaucoma  02/14/2010  . ALLERGIC RHINITIS 09/19/2009  . ANXIETY 04/15/2010  . Arthritis   . ASTHMA 09/19/2009  . BACK PAIN 02/14/2010  . Chronic bronchitis 04/14/2011  . CHRONIC OBSTRUCTIVE PULMONARY DISEASE, ACUTE EXACERBATION 12/19/2009  . Complication of anesthesia   . COPD 06/27/2009  . DEPRESSION 09/19/2009  . HOARSENESS 10/17/2009  . HYPERLIPIDEMIA 09/19/2009  . MENOPAUSAL DISORDER 08/20/2010  . OSTEOPENIA 09/10/2010  . Osteoporosis, unspecified 04/20/2014  . Rotator cuff tear 06/23/2011  . TINNITUS 09/10/2010  . TREMOR 02/14/2010  . Tremor 07/30/2011  . URI 09/10/2010  . Wheezing 06/27/2009    Past Surgical History:  Procedure Laterality Date  . ABDOMINAL HYSTERECTOMY    . cervical fusion 2013 - Dr Vertell Limber    . Normal Heart Cath  2001   Dr. Johnsie Cancel  . s/p laser tx for glaucoma     no vision loss     reports that she has been smoking Cigarettes.  She started smoking about 19 months ago. She has a 25.00 pack-year smoking history. She has never used smokeless tobacco. She reports that she does not drink alcohol or use drugs.  Allergies  Allergen Reactions  . Pravastatin Itching and Other (See Comments)    Reaction:  Muscle cramps   . Simvastatin Itching and Other (See Comments)    Reaction:  Muscle cramps   . Statins Itching and Other (See Comments)    Reaction:  Muscle cramps    Family History  Problem Relation Age of Onset  . Dementia Mother   . Rheum arthritis Maternal Uncle   . Colon cancer Neg Hx     Prior to Admission medications   Medication Sig Start Date End Date Taking? Authorizing  Provider  albuterol (PROVENTIL HFA) 108 (90 Base) MCG/ACT inhaler Inhale 2 puffs into the lungs every 6 (six) hours as needed for wheezing or shortness of breath. 02/06/17  Yes Biagio Borg, MD  albuterol (PROVENTIL) (2.5 MG/3ML) 0.083% nebulizer solution Take 3 mLs (2.5 mg total) by nebulization every 6 (six) hours as needed for wheezing or shortness of breath. 02/10/17  Yes Biagio Borg, MD  aspirin  EC 81 MG tablet Take 81 mg by mouth at bedtime.   Yes [provider]  atorvastatin (LIPITOR) 20 MG tablet Take 1 tablet (20 mg total) by mouth at bedtime. 02/06/17  Yes Biagio Borg, MD  b complex vitamins tablet Take 1 tablet by mouth at bedtime.   Yes [provider]  budesonide-formoterol (SYMBICORT) 160-4.5 MCG/ACT inhaler Inhale 2 puffs into the lungs 2 (two) times daily. 02/12/16  Yes Biagio Borg, MD  clonazePAM (KLONOPIN) 0.5 MG tablet Take 1 tablet (0.5 mg total) by mouth 2 (two) times daily as needed for anxiety. 08/20/16  Yes Biagio Borg, MD  donepezil (ARICEPT) 10 MG tablet Take 1 tablet (10 mg total) by mouth at bedtime. 12/12/16  Yes Patrecia Pour, Christean Grief, MD  escitalopram (LEXAPRO) 20 MG tablet Take 0.5 tablets (10 mg total) by mouth daily. 08/20/16  Yes Biagio Borg, MD  fluticasone furoate-vilanterol (BREO ELLIPTA) 200-25 MCG/INH AEPB Inhale 1 puff into the lungs daily. 02/09/17  Yes Mannam, Praveen, MD  furosemide (LASIX) 40 MG tablet Take 1 tablet (40 mg total) by mouth daily. 01/13/17  Yes Biagio Borg, MD  gabapentin (NEURONTIN) 100 MG capsule TAKE 1 CAPSULE BY MOUTH THREE TIMES DAILY 01/16/17  Yes Biagio Borg, MD  hydrochlorothiazide (HYDRODIURIL) 25 MG tablet Take 25 mg by mouth daily.   Yes [provider]  montelukast (SINGULAIR) 10 MG tablet TAKE 1 TABLET BY MOUTH EVERY NIGHT AT BEDTIME 03/26/16  Yes Biagio Borg, MD  potassium chloride (K-DUR) 10 MEQ tablet Take 1 tablet (10 mEq total) by mouth daily. 01/14/17  Yes Biagio Borg, MD  predniSONE (DELTASONE) 10 MG tablet 4 tabs x3 days, 3 tabs x 3 days, 2 tabs x 3 days, 1 tabs x 3 days then stop 02/09/17  Yes Mannam, Praveen, MD  Probiotic Product (PROBIOTIC PO) Take 1 capsule by mouth daily.   Yes [provider]  roflumilast (DALIRESP) 500 MCG TABS tablet Take 1 tablet (500 mcg total) by mouth daily. 02/09/17  Yes Mannam, Praveen, MD  tiotropium (SPIRIVA HANDIHALER) 18 MCG inhalation capsule  Place 1 capsule (18 mcg total) into inhaler and inhale daily. 10/28/16  Yes Biagio Borg, MD  umeclidinium bromide (INCRUSE ELLIPTA) 62.5 MCG/INH AEPB Inhale 1 puff into the lungs daily. 02/09/17  Yes Mannam, Praveen, MD  dextromethorphan-guaiFENesin (MUCINEX DM) 30-600 MG 12hr tablet Take 2 tablets by mouth 2 (two) times daily. Patient not taking: Reported on 02/22/2017 07/28/16   Barton Dubois, MD  Roflumilast (DALIRESP) 250 MCG TABS Take 250 mg by mouth daily. 02/09/17 02/10/17  Mannam, Hart Robinsons, MD  roflumilast (DALIRESP) 500 MCG TABS tablet Take 1 tablet (500 mcg total) by mouth daily. 02/09/17 02/10/17  Marshell Garfinkel, MD    Physical Exam: Vitals:   02/22/17 1830 02/22/17 1833 02/22/17 1835 02/22/17 1900  BP:  106/62 (!) 156/82 (!) 119/59  Pulse: (!) 107 100 98 93  Resp: 20 (!) 22 (!) 22 (!) 8  Temp:   97.7 F (36.5 C)   TempSrc:   Oral  SpO2: 100% 100% 98% 100%    Constitutional: NAD, calm, comfortable Eyes: PERRL, lids and conjunctivae normal ENMT: Mucous membranes are dry. Posterior pharynx clear of any exudate or lesions. Neck: normal, supple, no masses, no thyromegaly Respiratory: Decreased breath sounds and wheezing bilaterally, no crackles. No accessory muscle use.  Cardiovascular: Tachycardic at 112 bpm, no murmurs / rubs / gallops. No extremity edema. 2+ pedal pulses. No carotid bruits.  Abdomen: no tenderness, no masses palpated. No hepatosplenomegaly. Bowel sounds positive.  Musculoskeletal: no clubbing / cyanosis. Good ROM, no contractures. Normal muscle tone.  Skin: Multiple ecchymosis and lacerations on extremities. Neurologic: Positive baseline tremor, CN 2-12 grossly intact. Sensation intact, DTR normal. Strength is equal in all 4. She has generalized weakness. Psychiatric: Alert and oriented x 2, partially oriented to time and situation.[name] Anxious mood.    Labs on Admission: I have personally reviewed following labs and imaging studies  CBC:  Recent Labs Lab  02/22/17 1625  WBC 27.4*  NEUTROABS 24.4*  HGB 16.3*  HCT 45.5  MCV 94.6  PLT 024   Basic Metabolic Panel:  Recent Labs Lab 02/22/17 1625 02/22/17 1750 02/22/17 1902  NA 132*  --   --   K 2.2*  --   --   CL 69*  --   --   CO2 41*  --   --   GLUCOSE 209*  --   --   BUN 31*  --   --   CREATININE 1.15*  --   --   CALCIUM 9.7  --   --   MG  --  2.2  --   PHOS  --   --  3.7   GFR: Estimated Creatinine Clearance: 32.9 mL/min (A) (by C-G formula based on SCr of 1.15 mg/dL (H)). Liver Function Tests:  Recent Labs Lab 02/22/17 1625  AST 31  ALT 22  ALKPHOS 62  BILITOT 1.4*  PROT 6.7  ALBUMIN 4.0   No results for input(s): LIPASE, AMYLASE in the last 168 hours. No results for input(s): AMMONIA in the last 168 hours. Coagulation Profile: No results for input(s): INR, PROTIME in the last 168 hours. Cardiac Enzymes: No results for input(s): CKTOTAL, CKMB, CKMBINDEX, TROPONINI in the last 168 hours. BNP (last 3 results)  Recent Labs  01/13/17 1723  PROBNP 42.0   HbA1C: No results for input(s): HGBA1C in the last 72 hours. CBG: No results for input(s): GLUCAP in the last 168 hours. Lipid Profile: No results for input(s): CHOL, HDL, LDLCALC, TRIG, CHOLHDL, LDLDIRECT in the last 72 hours. Thyroid Function Tests: No results for input(s): TSH, T4TOTAL, FREET4, T3FREE, THYROIDAB in the last 72 hours. Anemia Panel: No results for input(s): VITAMINB12, FOLATE, FERRITIN, TIBC, IRON, RETICCTPCT in the last 72 hours. Urine analysis:    Component Value Date/Time   COLORURINE YELLOW 12/29/2016 False Pass 12/29/2016 1239   LABSPEC 1.010 12/29/2016 1239   PHURINE 7.0 12/29/2016 1239   GLUCOSEU NEGATIVE 12/29/2016 1239   HGBUR NEGATIVE 12/29/2016 1239   BILIRUBINUR NEGATIVE 12/29/2016 1239   KETONESUR NEGATIVE 12/29/2016 1239   PROTEINUR 100 (A) 12/05/2016 1727   UROBILINOGEN 0.2 12/29/2016 1239   NITRITE NEGATIVE 12/29/2016 1239   LEUKOCYTESUR SMALL (A)  12/29/2016 1239    Radiological Exams on Admission: Dg Chest Port 1 View  Result Date: 02/22/2017 CLINICAL DATA:  Shortness of breath for 3 weeks.  History of asthma. EXAM: PORTABLE CHEST 1 VIEW COMPARISON:  01/13/2017 FINDINGS: Cardiomediastinal silhouette is normal. Mediastinal contours  appear intact. Calcific atherosclerotic disease and tortuosity of the aorta. There is no evidence of focal airspace consolidation, pleural effusion or pneumothorax. Upper lobe predominant emphysema. Osseous structures are without acute abnormality. Partially visualized cervical spine fusion. Soft tissues are grossly normal. IMPRESSION: Upper lobe predominant emphysema. Calcific atherosclerotic disease of the aorta. No evidence of focal airspace consolidation. Electronically Signed   By: Fidela Salisbury M.D.   On: 02/22/2017 16:55  02/12/2017 echocardiogram complete ------------------------------------------------------------------- LV EF: 55% -   60%  ------------------------------------------------------------------- Indications:      Dyspnea on exertion (R06.09).  ------------------------------------------------------------------- History:   PMH:   Congestive heart failure.  Chronic obstructive pulmonary disease.  Risk factors:  Orthostatic hypotension. Dyslipidemia.  ------------------------------------------------------------------- Study Conclusions  - Left ventricle: The cavity size was normal. Wall thickness was   normal. Systolic function was normal. The estimated ejection   fraction was in the range of 55% to 60%. Wall motion was normal;   there were no regional wall motion abnormalities. Doppler   parameters are consistent with abnormal left ventricular   relaxation (grade 1 diastolic dysfunction). - Pulmonary arteries: Systolic pressure was mildly increased. PA   peak pressure: 38 mm Hg (S).  EKG: Independently reviewed.  Vent. rate 110 BPM PR interval * ms QRS duration 90  ms QT/QTc 395/535 ms P-R-T axes 101 77 247 Sinus tachycardia Biatrial enlargement Probable LVH with secondary repol abnrm ST depression, consider ischemia, diffuse lds Prolonged QT interval  Assessment/Plan Principal Problem:   Acute on chronic respiratory failure (HCC)   Chronic respiratory failure with hypoxia and hypercapnia (HCC)   COPD exacerbation (HCC) Admit to stepdown unit/inpatient. Continue supplemental oxygen. BiPAP ventilation as needed. Switch albuterol to Xopenex to decrease incidence of tachycardia. Continue ipratropium nebs. Continue Zyrtec or generic equivalent. Continue singular 10 mg by mouth bedtime. Hold Symbicort and Spiriva. Continue Roflumiast 500 mg by mouth daily. Given leukocytosis and lactic acidosis will continue IV antibiotics to cover for CAP. Follow-up blood cultures and sensitivity.  Active Problems:   Anxiety and depression Continue Lexapro 10 mg by mouth daily. Continue clonazepam 0.5 mg by mouth as needed for anxiety Lorazepam 0.5 mg IVP every 4 hours when necessary as needed while on BiPAP.    Tobacco abuse Declined nicotine replacement therapy at this time. Tobacco cessation information to be provided.      Mild cognitive impairment Continue Aricept 10 mg by mouth at bedtime.    Hyperlipidemia Continue atorvastatin 20 mg by mouth at bedtime. Monitor LFTs as needed. Fasting lipid profile follow-up as an outpatient.    Diastolic CHF ([place]) Hold diuretics for now, since the patient seems to be volume depleted and has an elevated lactic acid. Monitor intake and output closely.      Hypokalemia Replacing. Follow-up potassium level in a.m.    Elevated troponin Trend troponin levels. Check repeat EKG in a.m.    DVT prophylaxis: Heparin SQ. Code Status: Full code. Family Communication: To her granddaughters are present in the emergency room. Disposition Plan: Admit for IV hydration, IV antibiotics and close electrolyte  follow-up. Consults called:  Admission status: Inpatient/stepdown.   Reubin Milan MD [facility] Pager [phone].  If 7PM-7AM, please contact night-coverage [email or web address] Password [email or web address]  02/22/2017, 8:05 PM

## 2017-02-22 NOTE — Progress Notes (Signed)
BIPAP ordered for Pt QHS and PRN.  Pt states that she does not use BIPAP at home and doesn't want to sleep with one tonight.  Pt resting comfortably on 3 LPM [place] with no increase in WOB.  BIPAP will be held at this time per Pt request.  RT to monitor and assess as needed.

## 2017-02-22 NOTE — ED Notes (Signed)
Patient transported to CT 

## 2017-02-22 NOTE — ED Triage Notes (Signed)
Per EMS-states frequent falls today-multiple-states she was treated for an UTI 1 week ago and was falling then-states urinary frequency due to Lasix-compliant eith home O2 and meds-multiple skin tears to upper and lower extremities-125 mg of Solumedrol and 10mg  of albuterol and 1 mg of Atrovent given in route-expiratory wheezes-oriented X4

## 2017-02-22 NOTE — ED Notes (Signed)
Informed Dr. Olevia Bowens that pt is now endorsing left sided CP, described as pressure.  Obtained EKG.  Will continue to monitor.

## 2017-02-22 NOTE — Progress Notes (Signed)
[facility] Progress Note Patient Name: Michele Martin DOB: 03-20-1946 MRN: [id number]   Date of Service  02/22/2017  HPI/Events of Note  Lactic acid 7.1 and rising  eICU Interventions  Give more fluid bolus,  F/u lactic acid Let primary team know     Intervention Category Major Interventions: Shock - evaluation and management;Sepsis - evaluation and management  Asencion Noble 02/22/2017, 10:57 PM

## 2017-02-22 NOTE — Progress Notes (Signed)
CRITICAL VALUE ALERT  Critical Value:  Lactic acid 7.1 troponin 0.03  Date & Time Notied:  2215  Provider Notified: [id number]  Orders Received/Actions taken: bolus ordered and given

## 2017-02-23 DIAGNOSIS — J441 Chronic obstructive pulmonary disease with (acute) exacerbation: Secondary | ICD-10-CM

## 2017-02-23 DIAGNOSIS — F329 Major depressive disorder, single episode, unspecified: Secondary | ICD-10-CM

## 2017-02-23 DIAGNOSIS — W19XXXA Unspecified fall, initial encounter: Secondary | ICD-10-CM

## 2017-02-23 DIAGNOSIS — J9621 Acute and chronic respiratory failure with hypoxia: Secondary | ICD-10-CM

## 2017-02-23 DIAGNOSIS — A419 Sepsis, unspecified organism: Secondary | ICD-10-CM

## 2017-02-23 DIAGNOSIS — E440 Moderate protein-calorie malnutrition: Secondary | ICD-10-CM | POA: Insufficient documentation

## 2017-02-23 DIAGNOSIS — R778 Other specified abnormalities of plasma proteins: Secondary | ICD-10-CM | POA: Diagnosis present

## 2017-02-23 DIAGNOSIS — I959 Hypotension, unspecified: Secondary | ICD-10-CM

## 2017-02-23 DIAGNOSIS — J9622 Acute and chronic respiratory failure with hypercapnia: Principal | ICD-10-CM

## 2017-02-23 DIAGNOSIS — R748 Abnormal levels of other serum enzymes: Secondary | ICD-10-CM

## 2017-02-23 DIAGNOSIS — F419 Anxiety disorder, unspecified: Secondary | ICD-10-CM

## 2017-02-23 DIAGNOSIS — R7989 Other specified abnormal findings of blood chemistry: Secondary | ICD-10-CM

## 2017-02-23 LAB — CBC WITH DIFFERENTIAL/PLATELET
BASOS ABS: 0 10*3/uL (ref 0.0–0.1)
BASOS PCT: 0 %
EOS PCT: 0 %
Eosinophils Absolute: 0 10*3/uL (ref 0.0–0.7)
HCT: 31.6 % — ABNORMAL LOW (ref 36.0–46.0)
Hemoglobin: 10.6 g/dL — ABNORMAL LOW (ref 12.0–15.0)
LYMPHS PCT: 1 %
Lymphs Abs: 0.2 10*3/uL — ABNORMAL LOW (ref 0.7–4.0)
MCH: 32.1 pg (ref 26.0–34.0)
MCHC: 33.5 g/dL (ref 30.0–36.0)
MCV: 95.8 fL (ref 78.0–100.0)
MONO ABS: 0.3 10*3/uL (ref 0.1–1.0)
Monocytes Relative: 1 %
Neutro Abs: 16.8 10*3/uL — ABNORMAL HIGH (ref 1.7–7.7)
Neutrophils Relative %: 97 %
PLATELETS: 255 10*3/uL (ref 150–400)
RBC: 3.3 MIL/uL — ABNORMAL LOW (ref 3.87–5.11)
RDW: 13.2 % (ref 11.5–15.5)
WBC: 17.3 10*3/uL — ABNORMAL HIGH (ref 4.0–10.5)

## 2017-02-23 LAB — PROCALCITONIN: PROCALCITONIN: 0.38 ng/mL

## 2017-02-23 LAB — COMPREHENSIVE METABOLIC PANEL
ALBUMIN: 2.5 g/dL — AB (ref 3.5–5.0)
ALK PHOS: 37 U/L — AB (ref 38–126)
ALT: 18 U/L (ref 14–54)
AST: 33 U/L (ref 15–41)
Anion gap: 9 (ref 5–15)
BILIRUBIN TOTAL: 0.4 mg/dL (ref 0.3–1.2)
BUN: 21 mg/dL — AB (ref 6–20)
CALCIUM: 6.9 mg/dL — AB (ref 8.9–10.3)
CO2: 26 mmol/L (ref 22–32)
CREATININE: 0.57 mg/dL (ref 0.44–1.00)
Chloride: 100 mmol/L — ABNORMAL LOW (ref 101–111)
GFR calc Af Amer: >60 mL/min (ref >60)
GFR calc non Af Amer: >60 mL/min (ref >60)
GLUCOSE: 180 mg/dL — AB (ref 65–99)
Potassium: 3.5 mmol/L (ref 3.5–5.1)
Sodium: 135 mmol/L (ref 135–145)
TOTAL PROTEIN: 4.3 g/dL — AB (ref 6.5–8.1)

## 2017-02-23 LAB — TROPONIN I
TROPONIN I: 0.03 ng/mL — AB (ref <0.03)
Troponin I: <0.03 ng/mL (ref <0.03)

## 2017-02-23 LAB — LACTIC ACID, PLASMA
LACTIC ACID, VENOUS: 2.2 mmol/L — AB (ref 0.5–1.9)
Lactic Acid, Venous: 6.3 mmol/L (ref 0.5–1.9)
Lactic Acid, Venous: 4.5 mmol/L (ref 0.5–1.9)
Lactic Acid, Venous: 3.3 mmol/L (ref 0.5–1.9)

## 2017-02-23 MED ORDER — ARFORMOTEROL TARTRATE 15 MCG/2ML IN NEBU
15.0000 ug | INHALATION_SOLUTION | Freq: Two times a day (BID) | RESPIRATORY_TRACT | Status: DC
  Administered 2017-02-23 – 2017-02-25 (×4): 15 ug via RESPIRATORY_TRACT
  Filled 2017-02-23 (×4): qty 2

## 2017-02-23 MED ORDER — IPRATROPIUM-ALBUTEROL 0.5-2.5 (3) MG/3ML IN SOLN: 3.0000 mL | Freq: Four times a day (QID) | RESPIRATORY_TRACT | Status: DC

## 2017-02-23 MED ORDER — SODIUM CHLORIDE 0.9 % IV SOLN
1.0000 g | Freq: Once | INTRAVENOUS | Status: AC
  Administered 2017-02-23: 1 g via INTRAVENOUS
  Filled 2017-02-23: qty 10

## 2017-02-23 MED ORDER — ENOXAPARIN SODIUM 40 MG/0.4ML ~~LOC~~ SOLN
40.0000 mg | SUBCUTANEOUS | Status: DC
  Administered 2017-02-23 – 2017-02-24 (×2): 40 mg via SUBCUTANEOUS
  Filled 2017-02-23 (×2): qty 0.4

## 2017-02-23 MED ORDER — ZOLPIDEM TARTRATE 5 MG PO TABS
5.0000 mg | ORAL_TABLET | Freq: Once | ORAL | Status: AC
  Administered 2017-02-23: 5 mg via ORAL
  Filled 2017-02-23: qty 1

## 2017-02-23 MED ORDER — ENSURE ENLIVE PO LIQD
237.0000 mL | Freq: Two times a day (BID) | ORAL | Status: DC
  Administered 2017-02-23 (×2): 237 mL via ORAL

## 2017-02-23 MED ORDER — FLUTICASONE FUROATE-VILANTEROL 200-25 MCG/INH IN AEPB
1.0000 | INHALATION_SPRAY | Freq: Every day | RESPIRATORY_TRACT | Status: DC
  Administered 2017-02-23: 1 via RESPIRATORY_TRACT
  Filled 2017-02-23: qty 28

## 2017-02-23 MED ORDER — ORAL CARE MOUTH RINSE
15.0000 mL | Freq: Two times a day (BID) | OROMUCOSAL | Status: DC
  Administered 2017-02-23 – 2017-02-24 (×2): 15 mL via OROMUCOSAL

## 2017-02-23 MED ORDER — ALBUTEROL SULFATE (2.5 MG/3ML) 0.083% IN NEBU: 2.5000 mg | INHALATION_SOLUTION | RESPIRATORY_TRACT | Status: DC | PRN

## 2017-02-23 MED ORDER — PHENOL 1.4 % MT LIQD
1.0000 | OROMUCOSAL | Status: DC | PRN
  Filled 2017-02-23: qty 177

## 2017-02-23 MED ORDER — SODIUM CHLORIDE 0.9 % IV BOLUS (SEPSIS)
1000.0000 mL | Freq: Once | INTRAVENOUS | Status: AC
  Administered 2017-02-23: 1000 mL via INTRAVENOUS

## 2017-02-23 MED ORDER — IPRATROPIUM-ALBUTEROL 0.5-2.5 (3) MG/3ML IN SOLN
3.0000 mL | Freq: Three times a day (TID) | RESPIRATORY_TRACT | Status: DC
  Administered 2017-02-23 – 2017-02-25 (×5): 3 mL via RESPIRATORY_TRACT
  Filled 2017-02-23 (×5): qty 3

## 2017-02-23 MED ORDER — BUDESONIDE 0.5 MG/2ML IN SUSP
0.5000 mg | Freq: Two times a day (BID) | RESPIRATORY_TRACT | Status: DC
  Administered 2017-02-23 – 2017-02-25 (×4): 0.5 mg via RESPIRATORY_TRACT
  Filled 2017-02-23 (×4): qty 2

## 2017-02-23 MED ORDER — ENSURE ENLIVE PO LIQD
237.0000 mL | ORAL | Status: DC
  Administered 2017-02-24: 237 mL via ORAL

## 2017-02-23 NOTE — Progress Notes (Signed)
Pt requesting a sleeping pill during the night, K. Schorr,NP paged and notified. Order obtained for [employer].

## 2017-02-23 NOTE — Progress Notes (Signed)
Initial Nutrition Assessment  DOCUMENTATION CODES:   Non-severe (moderate) malnutrition in context of chronic illness  INTERVENTION:  - Continue Ensure Enlive but will decrease to once/day, this supplement provides 350 kcal and 20 grams of protein - Continue to encourage PO intakes of meals and supplements.   NUTRITION DIAGNOSIS:   Malnutrition (moderate/non-severe) related to chronic illness (COPD) as evidenced by mild depletion of body fat, mild depletion of muscle mass, moderate depletion of body fat, moderate depletions of muscle mass.  GOAL:   Patient will meet greater than or equal to 90% of their needs  MONITOR:   PO intake, Supplement acceptance, Weight trends, Labs  REASON FOR ASSESSMENT:   Malnutrition Screening Tool, Consult COPD Protocol  ASSESSMENT:   71 y.o. female with medical history significant of acute angle closure glaucoma, allergic rhinitis, sinusitis, osteoarthritis, asthma, chronic back pain, COPD, anxiety/depression, hyperlipidemia, osteopenia, tremor who is brought to the emergency department via EMS after having multiple falls today, worsening dyspnea, greenish sputum production cough and wheezing for the past 2 days. She was recently treated for an UTI.  Pt seen for consult. BMI indicates normal weight. No intakes documented since admission. Pt reports being able to eat a little bit for meals today; she had a few bites each of mashed potatoes and pot roast with gravy for lunch. Open Ensure Enlive on bedside table. Pt reports she likes vanilla flavor and that she drinks one bottle of Ensure/day at home. She states that supplement is very filling for her. She states that appetite varies; some days she eats several large meals and snacks and other days she only eats bites throughout the day. She denies chewing or swallowing issues. When asked if she experiences increased SOB with eating, pt states "if I want to eat, nothing is going to stop me." She states that  even if breathing becomes more labored during times of eating it does not greatly affect her.   Physical assessment shows mild/moderate fat wasting to upper arm and mild/moderate muscle wasting to shoulder areas; lower body not assessed at this time. Per chart review, pt's weight has fluctuated often over the past 2 months: 99-108 lbs. Will monitor weight trends closely during hospitalization.  Medications reviewed; 1 g IV Ca gluconate x1 run today, 20 mg oral Pepcid BID, 2 g IV Mg sulfate x1 run yesterday, 40 mg Solu-medrol QID, 10 mEq IV KCl x4 runs yesterday, 40 mEq oral KCl x1 dose yesterday. Labs reviewed; Cl: 100 mmol/L, BUN: 21 mg/dL, Ca: 6.9 mg/dL.   Diet Order:  Diet Heart Room service appropriate? Yes; Fluid consistency: Thin  Skin:  Reviewed, no issues  Last BM:  7/30  Height:   Ht Readings from Last 1 Encounters:  02/22/17 5\' 1"  (1.549 m)    Weight:   Wt Readings from Last 1 Encounters:  02/22/17 107 lb 9.4 oz (48.8 kg)    Ideal Body Weight:  47.73 kg  BMI:  Body mass index is 20.33 kg/m.  Estimated Nutritional Needs:   Kcal:  1365-1560 (28-32 kcal/kg)  Protein:  60-70 grams  Fluid:  >/= 1.5 L/day  EDUCATION NEEDS:   No education needs identified at this time    Jarome Matin, MS, RD, LDN, CNSC Inpatient Clinical Dietitian Pager # [phone] After hours/weekend pager # [phone]

## 2017-02-23 NOTE — Progress Notes (Signed)
CRITICAL VALUE ALERT  Critical Value:  Critical EKG Long QTc  Date & Time Notied:  0418  Provider Notified: Hal Hope 0430  Orders Received/Actions taken: labs confirmed for am

## 2017-02-23 NOTE — Progress Notes (Signed)
PROGRESS NOTE  Michele Martin  WNU:[id number] DOB: 03-25-1946 DOA: 02/22/2017 PCP: Biagio Borg, MD   Brief Narrative: Michele Martin is a 71 y.o. female with a history of asthma/COPD and chronic respiratory failure as well as angle closure glaucoma, allergic rhinitis, sinusitis, osteoarthritis, chronic back pain, anxiety/depression, hyperlipidemia, osteopenia, and tremor who presented to the ED via EMS for multiple falls in the setting of worsening dyspnea, wheezing and increased sputum production for 2 days, associated with fatigue and chills but no fever. En route she was given solumedrol and duoneb with ED vital signs afebrile, HR 111, BP 100/67 and saturating well on home 2L by [place].  She appeared to be septic with lactate 4.44, WBC 27.4, Cr 1.15. CXR showed no definite infiltrate. Weight-based IVF's provided and empiric antibiotics for CAP based on clinical presentation were given. Lactic acid rose to 7.1 and BP remained soft, so further IV fluids were provided.  Assessment & Plan: Principal Problem:   Acute on chronic respiratory failure (HCC) Active Problems:   Anxiety and depression   Tobacco abuse   COPD exacerbation (HCC)   Chest pain   Mild cognitive impairment   Hyperlipidemia   Diastolic CHF (HCC)   Chronic respiratory failure with hypoxia and hypercapnia (HCC)   Hypokalemia   Elevated troponin  Acute on chronic hypoxic respiratory failure: Due to COPD exacerbation +/- pneumonia - IV steroids - Scheduled and prn bronchodilators - Continue singulair, roflumilast, breo as below - BiPAP prn - Treat sepsis as below  Sepsis: With suspected pulmonary source.  - Monitor closely in SDU. So far +6.5L IVFs with soft BPs and h/o volume overload in similar situation in previous admissions.  - Check PCT and repeat lactate.  - Continue empiric ceftriaxone and azithromycin, repeat CXR in AM.  COPD GOLD D: - Followed by [facility] pulmonology, recently started on daliresp,  changed symbicort, spiriva to breo, incruse  Anxiety and depression: Chronic, stable - Continue lexapro, prn clonazepam.   Tobacco abuse: Using e-cigarettes currently - Brief cessation counseling provided - Declined nicotine patch  Mild cognitive impairment:  - Continue aricept 10 mg qHS  Hyperlipidemia - Continue statin  HFpEF - Holding diuretics currently in setting of sepsis.  - Monitor I/O, UOP    Hypokalemia: Resolved Replacing. Follow-up potassium level in a.m.  Demand ischemia: minimally elevated troponin (0 > 0.03 > 0.03 > neg) without chest pain not consistent with ACS.  - Monitor for chest pain  DVT prophylaxis: Lovenox Code Status: Full Family Communication: None at bedside Disposition Plan: Continue SDU  Consultants:   PCCM  Procedures:   Echocardiogram PTA: - Left ventricle: The cavity size was normal. Wall thickness was   normal. Systolic function was normal. The estimated ejection   fraction was in the range of 55% to 60%. Wall motion was normal;   there were no regional wall motion abnormalities. Doppler   parameters are consistent with abnormal left ventricular   relaxation (grade 1 diastolic dysfunction). - Pulmonary arteries: Systolic pressure was mildly increased. PA   peak pressure: 38 mm Hg (S).  Antimicrobials:  Ceftriaxone 7/29 >>  Azithromycin 7/29 >>   Subjective: This morning she states she came in just for frequent falling, that her breathing is a little worse than usual. Denies cough. Breathing treatments have helped, but make her jittery. No fevers, denies urinary symptoms.   Objective: Vitals:   02/23/17 0400 02/23/17 0629 02/23/17 0700 02/23/17 0800  BP: 97/60 (!) 89/35 (!) 93/34   Pulse:  79 73 73   Resp: 15 17 17    Temp: 98.3 F (36.8 C)   98.3 F (36.8 C)  TempSrc: Oral   Oral  SpO2: 99% 98% 99% 100%  Weight:      Height:        Intake/Output Summary (Last 24 hours) at 02/23/17 1133 Last data filed at  02/23/17 1106  Gross per 24 hour  Intake          4482.62 ml  Output              900 ml  Net          3582.62 ml   Filed Weights   02/22/17 2349  Weight: 48.8 kg (107 lb 9.4 oz)    Examination: General exam: Tremulous elderly female in no acute distress Respiratory system: Mildly labored on 2L [place]. Bilateral wheezing without crackles.  Cardiovascular system: Regular rate and rhythm. No murmur, rub, or gallop. No JVD, and no pedal edema. Gastrointestinal system: Abdomen soft, non-tender, non-distended, with normoactive bowel sounds. No organomegaly or masses felt. Central nervous system: Alert and oriented. No focal neurological deficits. Extremities: Warm, no deformities Skin: LE's with scattered superficial skin avulsions/abrasions without erythema or drainage.  Psychiatry: Judgement and insight appear fair. Mood & affect appropriate.   Data Reviewed: I have personally reviewed following labs and imaging studies  CBC:  Recent Labs Lab 02/22/17 1625 02/23/17 0522  WBC 27.4* 17.3*  NEUTROABS 24.4* 16.8*  HGB 16.3* 10.6*  HCT 45.5 31.6*  MCV 94.6 95.8  PLT 400 277   Basic Metabolic Panel:  Recent Labs Lab 02/22/17 1625 02/22/17 1750 02/22/17 1902 02/23/17 0522  NA 132*  --   --  135  K 2.2*  --   --  3.5  CL 69*  --   --  100*  CO2 41*  --   --  26  GLUCOSE 209*  --   --  180*  BUN 31*  --   --  21*  CREATININE 1.15*  --   --  0.57  CALCIUM 9.7  --   --  6.9*  MG  --  2.2  --   --   PHOS  --   --  3.7  --    GFR: Estimated Creatinine Clearance: 48 mL/min (by C-G formula based on SCr of 0.57 mg/dL). Liver Function Tests:  Recent Labs Lab 02/22/17 1625 02/23/17 0522  AST 31 33  ALT 22 18  ALKPHOS 62 37*  BILITOT 1.4* 0.4  PROT 6.7 4.3*  ALBUMIN 4.0 2.5*   No results for input(s): LIPASE, AMYLASE in the last 168 hours. No results for input(s): AMMONIA in the last 168 hours. Coagulation Profile: No results for input(s): INR, PROTIME in the last 168  hours. Cardiac Enzymes:  Recent Labs Lab 02/22/17 2127 02/23/17 0056 02/23/17 0522  TROPONINI 0.03* 0.03* <0.03   BNP (last 3 results)  Recent Labs  01/13/17 1723  PROBNP 42.0   HbA1C: No results for input(s): HGBA1C in the last 72 hours. CBG: No results for input(s): GLUCAP in the last 168 hours. Lipid Profile: No results for input(s): CHOL, HDL, LDLCALC, TRIG, CHOLHDL, LDLDIRECT in the last 72 hours. Thyroid Function Tests: No results for input(s): TSH, T4TOTAL, FREET4, T3FREE, THYROIDAB in the last 72 hours. Anemia Panel: No results for input(s): VITAMINB12, FOLATE, FERRITIN, TIBC, IRON, RETICCTPCT in the last 72 hours. Urine analysis:    Component Value Date/Time   COLORURINE YELLOW 02/22/2017 2115  APPEARANCEUR CLEAR 02/22/2017 2115   LABSPEC 1.010 02/22/2017 2115   PHURINE 6.0 02/22/2017 2115   GLUCOSEU 50 (A) 02/22/2017 2115   GLUCOSEU NEGATIVE 12/29/2016 1239   HGBUR SMALL (A) 02/22/2017 2115   BILIRUBINUR NEGATIVE 02/22/2017 2115   KETONESUR NEGATIVE 02/22/2017 2115   PROTEINUR NEGATIVE 02/22/2017 2115   UROBILINOGEN 0.2 12/29/2016 1239   NITRITE NEGATIVE 02/22/2017 2115   LEUKOCYTESUR TRACE (A) 02/22/2017 2115   Recent Results (from the past 240 hour(s))  Culture, blood (routine x 2)     Status: None (Preliminary result)   Collection Time: 02/22/17  4:02 PM  Result Value Ref Range Status   Specimen Description BLOOD LEFT WRIST  Final   Special Requests   Final    BOTTLES DRAWN AEROBIC ONLY Blood Culture adequate volume   Culture PENDING  Incomplete   Report Status PENDING  Incomplete  Culture, blood (routine x 2)     Status: None (Preliminary result)   Collection Time: 02/22/17  4:05 PM  Result Value Ref Range Status   Specimen Description BLOOD LEFT ARM  Final   Special Requests   Final    BOTTLES DRAWN AEROBIC AND ANAEROBIC Blood Culture adequate volume   Culture PENDING  Incomplete   Report Status PENDING  Incomplete  MRSA PCR Screening      Status: None   Collection Time: 02/22/17 10:41 PM  Result Value Ref Range Status   MRSA by PCR NEGATIVE NEGATIVE Final    Comment:        The GeneXpert MRSA Assay (FDA approved for NASAL specimens only), is one component of a comprehensive MRSA colonization surveillance program. It is not intended to diagnose MRSA infection nor to guide or monitor treatment for MRSA infections.       Radiology Studies: Ct Head Wo Contrast  Result Date: 02/22/2017 CLINICAL DATA:  Frequent falls. EXAM: CT HEAD WITHOUT CONTRAST TECHNIQUE: Contiguous axial images were obtained from the base of the skull through the vertex without intravenous contrast. COMPARISON:  MRI of the brain 06/20/2015 FINDINGS: Brain: No evidence of acute infarction, hemorrhage, hydrocephalus, extra-axial collection or mass lesion/mass effect. Moderate brain parenchymal volume loss and microangiopathy. Slight enlargement of the calcified right parafalcine anterior frontal meningioma, which now measures 18 by 11 by 18 mm, prior measurement 15 x 11 x 16 mm, 06/20/2015. Vascular: Calcific atherosclerotic disease at the skullbase. Skull: Normal. Negative for fracture or focal lesion. Sinuses/Orbits: No acute finding. Other: None. IMPRESSION: No acute intracranial abnormality. Moderate brain parenchymal atrophy and chronic microvascular disease. Slight enlargement of calcified right parafalcine anterior frontal meningioma, which now measures 18 mm in greatest dimension. Electronically Signed   By: Fidela Salisbury M.D.   On: 02/22/2017 21:07   Dg Chest Port 1 View  Result Date: 02/22/2017 CLINICAL DATA:  Shortness of breath for 3 weeks.  History of asthma. EXAM: PORTABLE CHEST 1 VIEW COMPARISON:  01/13/2017 FINDINGS: Cardiomediastinal silhouette is normal. Mediastinal contours appear intact. Calcific atherosclerotic disease and tortuosity of the aorta. There is no evidence of focal airspace consolidation, pleural effusion or pneumothorax.  Upper lobe predominant emphysema. Osseous structures are without acute abnormality. Partially visualized cervical spine fusion. Soft tissues are grossly normal. IMPRESSION: Upper lobe predominant emphysema. Calcific atherosclerotic disease of the aorta. No evidence of focal airspace consolidation. Electronically Signed   By: Fidela Salisbury M.D.   On: 02/22/2017 16:55    Scheduled Meds: . aspirin EC  81 mg Oral QHS  . atorvastatin  20 mg Oral QHS  .  azithromycin  500 mg Oral q1800  . donepezil  10 mg Oral QHS  . escitalopram  10 mg Oral Daily  . famotidine  20 mg Oral BID  . feeding supplement (ENSURE ENLIVE)  237 mL Oral BID BM  . gabapentin  100 mg Oral TID  . guaiFENesin  600 mg Oral BID  . heparin  5,000 Units Subcutaneous Q8H  . ipratropium  0.5 mg Nebulization TID  . levalbuterol  0.63 mg Nebulization TID  . mouth rinse  15 mL Mouth Rinse BID  . methylPREDNISolone (SOLU-MEDROL) injection  40 mg Intravenous Q6H  . montelukast  10 mg Oral QHS  . roflumilast  500 mcg Oral Daily   Continuous Infusions: . cefTRIAXone (ROCEPHIN)  IV       LOS: 1 day   Time spent: 25 minutes.  Vance Gather, MD [facility] Pager [phone]  If 7PM-7AM, please contact night-coverage [email or web address] Password [email or web address] 02/23/2017, 11:33 AM

## 2017-02-23 NOTE — Progress Notes (Addendum)
CRITICAL VALUE ALERT  Critical Value:  Lactic acid 4.5, calcium 6.9  Date & Time Notied:  0615 Pressors were discussed, consulting CCM was discussed, Calcium was ordered.  Provider Notified: dr Hal Hope  Orders Received/Actions taken: calcium gluconate ordered

## 2017-02-23 NOTE — Progress Notes (Signed)
CRITICAL VALUE ALERT  Critical Value:  Lactic acid 6.3  Date & Time Notied:  0148  Provider Notified: [email or web address] [id number]  Orders Received/Actions taken: bolus and re draw lactic acid later in am

## 2017-02-23 NOTE — Care Management Note (Signed)
Case Management Note  Patient Details  Name: Michele Martin MRN: [id number] Date of Birth: July 05, 1946  Subjective/Objective:     COPD and sepsis               Action/Plan: Date:  February 23, 2017 Chart reviewed for concurrent status and case management needs. Will continue to follow patient progress.  Lives alone with no immediate support from family. Discharge Planning: following for needs Expected discharge date: [id number] Velva Harman, BSN, [place], [place]  Expected Discharge Date:                  Expected Discharge Plan:  Home/Self Care  In-House Referral:     Discharge planning Services  CM Consult  Post Acute Care Choice:    Choice offered to:     DME Arranged:    DME Agency:     HH Arranged:    HH Agency:     Status of Service:  In process, will continue to follow  If discussed at Long Length of Stay Meetings, dates discussed:    Additional Comments:  Leeroy Cha, RN 02/23/2017, 8:52 AM

## 2017-02-23 NOTE — Consult Note (Signed)
Name: Michele Martin MRN: [id number] DOB: [birth year]    ADMISSION DATE:  02/22/2017 CONSULTATION DATE:  02/23/17  REFERRING MD :  Dr. Bonner Puna   CHIEF COMPLAINT:  Frequent falls, Shortness of Breath    HISTORY OF PRESENT ILLNESS:  71 y/o F who presented to [facility] on 7/29 with reports of multiple falls on day of admit, urinary frequency and shortness of breath.    At baseline, she reports she lives independently and provides all of her self care.  One week prior to admit, she was treated for a urinary tract infection.  She activated EMS on day of admit due to frequent falls at home.  She states her legs "just gave out".  She denies LOC with episodes.  She also reported increased shortness breath in the week prior to admit. She denies fevers, chills, nausea/vomiting, cough with change in baseline sputum production (occasional white / clear sputum). She denies sick contacts.  On initial presentation, she was documented to have tachypnea with accessory muscle use.  CXR was negative for acute infiltrate. Admit labs - Na 132, K 2.2, Cl 69, CO2 41, BUN 31, Sr Cr 1.15, AG 22, troponin <0.03, lactic acid 4.44 > 7.1 > 2.2, WBC 27.4 (down to 17.3), hgb 16.3, and platelets 400. She was admitted by [facility] for further evaluation of falls and SOB.    PCCM consulted for evaluation of COPD.    Hx GOLD D COPD (CAT Score 23).  Reports she typically is on prednisone 3-4 times per year and has been hospitalized at least once in 2018 for COPD.  She reports she is on home oxygen at 2L at night. Quit smoking (reportedly 5 years ago) but office notes reflect she continues to smoke intermittently. Last PFT's in September 2015 with severe obstruction with bronchodilator response, moderate diffusion defect. Last seen in Pulmonary Office on 7/16 for COPD follow up (Dr. Vaughan Browner). During office visit on 02/09/17, she was felt to be having an active exacerbation of COPD > given a depo-medrol injection and started on a prednisone taper.   In addition, she was transitioned from [place] / spriva to [place] / Incr[employer] and [employer].    PAST MEDICAL HISTORY :   has a past medical history of Acute angle-closure glaucoma (02/14/2010); ALLERGIC RHINITIS (09/19/2009); ANXIETY (04/15/2010); Arthritis; ASTHMA (09/19/2009); BACK PAIN (02/14/2010); Chronic bronchitis (04/14/2011); CHRONIC OBSTRUCTIVE PULMONARY DISEASE, ACUTE EXACERBATION (12/19/2009); Complication of anesthesia; COPD (06/27/2009); DEPRESSION (09/19/2009); HOARSENESS (10/17/2009); HYPERLIPIDEMIA (09/19/2009); MENOPAUSAL DISORDER (08/20/2010); OSTEOPENIA (09/10/2010); Osteoporosis, unspecified (04/20/2014); Rotator cuff tear (06/23/2011); TINNITUS (09/10/2010); TREMOR (02/14/2010); Tremor (07/30/2011); URI (09/10/2010); and Wheezing (06/27/2009).   has a past surgical history that includes Normal Heart Cath (2001); Abdominal hysterectomy; s/p laser tx for glaucoma; and cervical fusion 2013 - Dr Vertell Limber.  Prior to Admission medications   Medication Sig Start Date End Date Taking? Authorizing Provider  albuterol (PROVENTIL HFA) 108 (90 Base) MCG/ACT inhaler Inhale 2 puffs into the lungs every 6 (six) hours as needed for wheezing or shortness of breath. 02/06/17  Yes Biagio Borg, MD  albuterol (PROVENTIL) (2.5 MG/3ML) 0.083% nebulizer solution Take 3 mLs (2.5 mg total) by nebulization every 6 (six) hours as needed for wheezing or shortness of breath. 02/10/17  Yes Biagio Borg, MD  aspirin EC 81 MG tablet Take 81 mg by mouth at bedtime.   Yes [provider]  atorvastatin (LIPITOR) 20 MG tablet Take 1 tablet (20 mg total) by mouth at bedtime. 02/06/17  Yes Biagio Borg, MD  b complex vitamins tablet Take 1 tablet by mouth at bedtime.   Yes [provider]  budesonide-formoterol (SYMBICORT) 160-4.5 MCG/ACT inhaler Inhale 2 puffs into the lungs 2 (two) times daily. 02/12/16  Yes Biagio Borg, MD  clonazePAM (KLONOPIN) 0.5 MG tablet Take 1 tablet (0.5 mg total) by mouth 2 (two) times daily as  needed for anxiety. 08/20/16  Yes Biagio Borg, MD  donepezil (ARICEPT) 10 MG tablet Take 1 tablet (10 mg total) by mouth at bedtime. 12/12/16  Yes Patrecia Pour, Christean Grief, MD  escitalopram (LEXAPRO) 20 MG tablet Take 0.5 tablets (10 mg total) by mouth daily. 08/20/16  Yes Biagio Borg, MD  fluticasone furoate-vilanterol (BREO ELLIPTA) 200-25 MCG/INH AEPB Inhale 1 puff into the lungs daily. 02/09/17  Yes Mannam, Praveen, MD  furosemide (LASIX) 40 MG tablet Take 1 tablet (40 mg total) by mouth daily. 01/13/17  Yes Biagio Borg, MD  gabapentin (NEURONTIN) 100 MG capsule TAKE 1 CAPSULE BY MOUTH THREE TIMES DAILY 01/16/17  Yes Biagio Borg, MD  hydrochlorothiazide (HYDRODIURIL) 25 MG tablet Take 25 mg by mouth daily.   Yes [provider]  montelukast (SINGULAIR) 10 MG tablet TAKE 1 TABLET BY MOUTH EVERY NIGHT AT BEDTIME 03/26/16  Yes Biagio Borg, MD  potassium chloride (K-DUR) 10 MEQ tablet Take 1 tablet (10 mEq total) by mouth daily. 01/14/17  Yes Biagio Borg, MD  predniSONE (DELTASONE) 10 MG tablet 4 tabs x3 days, 3 tabs x 3 days, 2 tabs x 3 days, 1 tabs x 3 days then stop 02/09/17  Yes Mannam, Praveen, MD  Probiotic Product (PROBIOTIC PO) Take 1 capsule by mouth daily.   Yes [provider]  roflumilast (DALIRESP) 500 MCG TABS tablet Take 1 tablet (500 mcg total) by mouth daily. 02/09/17  Yes Mannam, Praveen, MD  tiotropium (SPIRIVA HANDIHALER) 18 MCG inhalation capsule Place 1 capsule (18 mcg total) into inhaler and inhale daily. 10/28/16  Yes Biagio Borg, MD  umeclidinium bromide (INCRUSE ELLIPTA) 62.5 MCG/INH AEPB Inhale 1 puff into the lungs daily. 02/09/17  Yes Mannam, Praveen, MD  dextromethorphan-guaiFENesin (MUCINEX DM) 30-600 MG 12hr tablet Take 2 tablets by mouth 2 (two) times daily. Patient not taking: Reported on 02/22/2017 07/28/16   Barton Dubois, MD  Roflumilast (DALIRESP) 250 MCG TABS Take 250 mg by mouth daily. 02/09/17 02/10/17  Mannam, Hart Robinsons, MD  roflumilast (DALIRESP)  500 MCG TABS tablet Take 1 tablet (500 mcg total) by mouth daily. 02/09/17 02/10/17  Marshell Garfinkel, MD    Allergies  Allergen Reactions  . Pravastatin Itching and Other (See Comments)    Reaction:  Muscle cramps   . Simvastatin Itching and Other (See Comments)    Reaction:  Muscle cramps   . Statins Itching and Other (See Comments)    Reaction:  Muscle cramps    FAMILY HISTORY:  family history includes Dementia in her mother; Rheum arthritis in her maternal uncle.  SOCIAL HISTORY:  reports that she has been smoking Cigarettes.  She started smoking about 19 months ago. She has a 25.00 pack-year smoking history. She has never used smokeless tobacco. She reports that she does not drink alcohol or use drugs.  REVIEW OF SYSTEMS:  POSITIVES IN BOLD Constitutional: Negative for fever, chills, weight loss, malaise/fatigue and diaphoresis.  HENT: Negative for hearing loss, ear pain, nosebleeds, congestion, sore throat, neck pain, tinnitus and ear discharge.   Eyes: Negative for blurred vision, double vision, photophobia, pain, discharge and redness.  Respiratory: Negative  for cough, hemoptysis, white sputum production, shortness of breath, wheezing and stridor.   Cardiovascular: Negative for chest pain, palpitations, orthopnea, claudication, leg swelling and PND.  Gastrointestinal: Negative for heartburn, nausea, vomiting, abdominal pain, diarrhea, constipation, blood in stool and melena.  Genitourinary: Negative for dysuria, urgency, frequency, hematuria and flank pain.  Musculoskeletal: Negative for myalgias, back pain, joint pain and falls.  Skin: Negative for itching and rash.  Neurological: Negative for dizziness, tingling, tremors, sensory change, speech change, focal weakness, seizures, loss of consciousness, weakness and headaches.  Mechanical falls prior to admit Endo/Heme/Allergies: Negative for environmental allergies and polydipsia. Does not bruise/bleed easily.  SUBJECTIVE:    VITAL SIGNS: Temp:  [97.7 F (36.5 C)-98.6 F (37 C)] 98.6 F (37 C) (07/30 1200) Pulse Rate:  [72-113] 83 (07/30 1200) Resp:  [8-22] 19 (07/30 1200) BP: (88-156)/(32-83) 118/46 (07/30 1000) SpO2:  [98 %-100 %] 99 % (07/30 1200) Weight:  [107 lb 9.4 oz (48.8 kg)] 107 lb 9.4 oz (48.8 kg) (07/29 2349)  PHYSICAL EXAMINATION: General: chronically ill appearing female in NAD, lying in bed HEENT: MM pink/moist, no jvd PSY: calm/appropriate Neuro: AAOx4, speech clear, MAE CV: s1s2 rrr, no m/r/g PULM: prolonged expiratory phase, lungs bilaterally diminished with faint wheezing throughout  GI: soft, non-tender, bsx4 active  Extremities: warm/dry, no edema  Skin: no rashes.  Thin skin with scattered bruises on upper extremities.      Recent Labs Lab 02/22/17 1625 02/23/17 0522  NA 132* 135  K 2.2* 3.5  CL 69* 100*  CO2 41* 26  BUN 31* 21*  CREATININE 1.15* 0.57  GLUCOSE 209* 180*     Recent Labs Lab 02/22/17 1625 02/23/17 0522  HGB 16.3* 10.6*  HCT 45.5 31.6*  WBC 27.4* 17.3*  PLT 400 255    Ct Head Wo Contrast  Result Date: 02/22/2017 CLINICAL DATA:  Frequent falls. EXAM: CT HEAD WITHOUT CONTRAST TECHNIQUE: Contiguous axial images were obtained from the base of the skull through the vertex without intravenous contrast. COMPARISON:  MRI of the brain 06/20/2015 FINDINGS: Brain: No evidence of acute infarction, hemorrhage, hydrocephalus, extra-axial collection or mass lesion/mass effect. Moderate brain parenchymal volume loss and microangiopathy. Slight enlargement of the calcified right parafalcine anterior frontal meningioma, which now measures 18 by 11 by 18 mm, prior measurement 15 x 11 x 16 mm, 06/20/2015. Vascular: Calcific atherosclerotic disease at the skullbase. Skull: Normal. Negative for fracture or focal lesion. Sinuses/Orbits: No acute finding. Other: None. IMPRESSION: No acute intracranial abnormality. Moderate brain parenchymal atrophy and chronic  microvascular disease. Slight enlargement of calcified right parafalcine anterior frontal meningioma, which now measures 18 mm in greatest dimension. Electronically Signed   By: Fidela Salisbury M.D.   On: 02/22/2017 21:07   Dg Chest Port 1 View  Result Date: 02/22/2017 CLINICAL DATA:  Shortness of breath for 3 weeks.  History of asthma. EXAM: PORTABLE CHEST 1 VIEW COMPARISON:  01/13/2017 FINDINGS: Cardiomediastinal silhouette is normal. Mediastinal contours appear intact. Calcific atherosclerotic disease and tortuosity of the aorta. There is no evidence of focal airspace consolidation, pleural effusion or pneumothorax. Upper lobe predominant emphysema. Osseous structures are without acute abnormality. Partially visualized cervical spine fusion. Soft tissues are grossly normal. IMPRESSION: Upper lobe predominant emphysema. Calcific atherosclerotic disease of the aorta. No evidence of focal airspace consolidation. Electronically Signed   By: Fidela Salisbury M.D.   On: 02/22/2017 16:55      SIGNIFICANT EVENTS  7/29  Admit   CULTURES BCx2 7/29 >>   ANTIBIOTICS Rocephin  7/29 >>  Azithromycin 7/29 >>   STUDIES:  CT Head 7/29 >> negative for acute intracranial abnormality, moderate brain parenchymal atrophy and chronic microvascular disease, slight enlargement of calcified R parafalcine anterior frontal meningioma    ASSESSMENT / PLAN:  Discussion: 71 y/o F with GOLD D COPD admitted on 7/29 with increasing shortness of breath & mechanical falls.  The patient continues to smoke "plastic cigarettes".  She also states she has OSA on nocturnal O2.  CXR is clear / without infiltrate.  She was recently treated for AECOPD.  Suspect elevated lactic acid was in setting of limited respiratory reserve and acute distress.  WBC may be from previous steroid administration.  Rule out acute infectious process.    Acute Exacerbation of COPD  Plan: Continue abx, recommend 5 days therapy Hold home Breo  for now  Adjust nebulized regimen to Brovana + Pulmicort while inpatient / acutely ill  Duoneb Q6 x 24 hours then reduce to PRN PRN albuterol if needed for wheezing / SOB Continue Daliresp  PRN BiPAP for increased WOB    Acute Hypoxic Respiratory Failure  Plan: O2 to support saturations 88-95% Will need ambulatory O2 needs assessment prior to discharge  Follow CXR intermittently    ?OSA - pt reports she is on nocturnal O2 Plan: Continue O2 for sats 88-95%   Lactic Acidosis - suspect related to respiratory distress in the setting of severe COPD, mild AKI / volume depletion Plan: Resolving, per primary    Mechanical Falls  Plan: PT consult once respiratory status improved   Mild AKI - resolving Volume Depletion - on lasix at baseline  Plan: Per primary   Michele Gens, NP-C [facility] Pgr: [phone] or if no answer [phone] 02/23/2017, 1:33 PM   Attending Note:  I have examined patient, reviewed labs, studies and notes. I have discussed the case with B Ollis, and I agree with the data and plans as amended above. Ms Cecere 71 with a history of severe COPD, suspected obstructive sleep apnea, recent hospitalization for AE-COPD. Then treated again for bronchospasm in mid July at the office. Her bronchodilator regimen was changed from Symbicort plus Spiriva to Breo + Incruse + daliresp. She was admitted after multiple falls at home, noted to have an increased work of breathing and a lactic acidosis, leukocytosis. She received volume resuscitation, 6.5 L total with ultimate improvement in her lactate. No clear source of sepsis has been identified, currently on empiric therapy for possible community-acquired pneumonia. Relative hypotension has improved with volume resuscitation. On my evaluation she is frail, ill-appearing. She has very distant breath sounds and a prolonged expiratory phase with pursed lips. Heart is regular without a murmur. Abdomen benign.  She has multiple bruises and abrasions on her knees and legs. Agree with her volume resuscitation. She appears to be euvolemic at this time. Continue treatment for suspected acute exacerbation COPD. Can likely narrow her antibiotics soon. Follow her QT-c, may need to stop the azithro if remains > 0.500.  Not clear to me that she is safe to go home without a SNF/rehabilitation stay.  We will follow with you  Baltazar Apo, MD, PhD 02/23/2017, 2:46 PM [facility] [phone] or if no answer [phone]

## 2017-02-24 ENCOUNTER — Inpatient Hospital Stay (HOSPITAL_COMMUNITY): Payer: Medicare Other

## 2017-02-24 ENCOUNTER — Inpatient Hospital Stay: Admission: RE | Admit: 2017-02-24 | Payer: Medicare Other | Source: Ambulatory Visit

## 2017-02-24 ENCOUNTER — Encounter (HOSPITAL_COMMUNITY): Payer: Self-pay

## 2017-02-24 ENCOUNTER — Other Ambulatory Visit: Payer: Self-pay | Admitting: Pharmacist

## 2017-02-24 DIAGNOSIS — G4733 Obstructive sleep apnea (adult) (pediatric): Secondary | ICD-10-CM | POA: Diagnosis not present

## 2017-02-24 DIAGNOSIS — I5033 Acute on chronic diastolic (congestive) heart failure: Secondary | ICD-10-CM | POA: Diagnosis not present

## 2017-02-24 DIAGNOSIS — F17210 Nicotine dependence, cigarettes, uncomplicated: Secondary | ICD-10-CM

## 2017-02-24 DIAGNOSIS — Z9981 Dependence on supplemental oxygen: Secondary | ICD-10-CM

## 2017-02-24 DIAGNOSIS — Z7982 Long term (current) use of aspirin: Secondary | ICD-10-CM

## 2017-02-24 DIAGNOSIS — Z87440 Personal history of urinary (tract) infections: Secondary | ICD-10-CM

## 2017-02-24 DIAGNOSIS — E440 Moderate protein-calorie malnutrition: Secondary | ICD-10-CM

## 2017-02-24 DIAGNOSIS — F418 Other specified anxiety disorders: Secondary | ICD-10-CM

## 2017-02-24 DIAGNOSIS — I5030 Unspecified diastolic (congestive) heart failure: Secondary | ICD-10-CM

## 2017-02-24 DIAGNOSIS — M810 Age-related osteoporosis without current pathological fracture: Secondary | ICD-10-CM

## 2017-02-24 DIAGNOSIS — F0390 Unspecified dementia without behavioral disturbance: Secondary | ICD-10-CM | POA: Diagnosis not present

## 2017-02-24 DIAGNOSIS — J441 Chronic obstructive pulmonary disease with (acute) exacerbation: Secondary | ICD-10-CM | POA: Diagnosis not present

## 2017-02-24 DIAGNOSIS — Z8701 Personal history of pneumonia (recurrent): Secondary | ICD-10-CM

## 2017-02-24 LAB — CBC
HEMATOCRIT: 31.1 % — AB (ref 36.0–46.0)
Hemoglobin: 10.4 g/dL — ABNORMAL LOW (ref 12.0–15.0)
MCH: 32.9 pg (ref 26.0–34.0)
MCHC: 33.4 g/dL (ref 30.0–36.0)
MCV: 98.4 fL (ref 78.0–100.0)
PLATELETS: 198 10*3/uL (ref 150–400)
RBC: 3.16 MIL/uL — ABNORMAL LOW (ref 3.87–5.11)
RDW: 13.6 % (ref 11.5–15.5)
WBC: 22.2 10*3/uL — AB (ref 4.0–10.5)

## 2017-02-24 LAB — BASIC METABOLIC PANEL
ANION GAP: 6 (ref 5–15)
BUN: 24 mg/dL — ABNORMAL HIGH (ref 6–20)
CALCIUM: 8.1 mg/dL — AB (ref 8.9–10.3)
CO2: 31 mmol/L (ref 22–32)
Chloride: 101 mmol/L (ref 101–111)
Creatinine, Ser: 0.48 mg/dL (ref 0.44–1.00)
Glucose, Bld: 136 mg/dL — ABNORMAL HIGH (ref 65–99)
Potassium: 4 mmol/L (ref 3.5–5.1)
SODIUM: 138 mmol/L (ref 135–145)

## 2017-02-24 MED ORDER — METHYLPREDNISOLONE SODIUM SUCC 40 MG IJ SOLR
40.0000 mg | Freq: Every day | INTRAMUSCULAR | Status: DC
  Administered 2017-02-25: 40 mg via INTRAVENOUS
  Filled 2017-02-24: qty 1

## 2017-02-24 MED ORDER — POTASSIUM CHLORIDE ER 10 MEQ PO TBCR
10.0000 meq | EXTENDED_RELEASE_TABLET | Freq: Every day | ORAL | Status: DC
  Administered 2017-02-24: 10 meq via ORAL
  Filled 2017-02-24 (×2): qty 1

## 2017-02-24 MED ORDER — FUROSEMIDE 40 MG PO TABS
40.0000 mg | ORAL_TABLET | Freq: Every day | ORAL | Status: DC
  Administered 2017-02-24 – 2017-02-25 (×2): 40 mg via ORAL
  Filled 2017-02-24 (×2): qty 1

## 2017-02-24 NOTE — Evaluation (Signed)
Physical Therapy Evaluation Patient Details Name: Michele Martin MRN: [id number] DOB: 27-Jan-1946 Today's Date: 02/24/2017   History of Present Illness  Michele Martin is a 71 y.o. female with a history of asthma/COPD and chronic respiratory failure as well as angle closure glaucoma, allergic rhinitis, sinusitis, osteoarthritis, chronic back pain, anxiety/depression, hyperlipidemia, osteopenia, and tremor who presented to the ED via EMS for multiple falls in the setting of worsening dyspnea  Clinical Impression  Pt admitted with above diagnosis. Pt currently with functional limitations due to the deficits listed below (see PT Problem List).  Pt will benefit from skilled PT to increase their independence and safety with mobility to allow discharge to the venue listed below.   Pt very motivated to work with pt as she is adamant that she not go to rehab at D/C; will continue to follow in acute setting; recommend HHPT;  HR 89-91 O2 sats 99% on 2L 2-3/4 DOE   Follow Up Recommendations Home health PT    Equipment Recommendations  None recommended by PT    Recommendations for Other Services       Precautions / Restrictions Precautions Precautions: Fall Restrictions Weight Bearing Restrictions: No      Mobility  Bed Mobility Overal bed mobility: Needs Assistance Bed Mobility: Supine to Sit     Supine to sit: Min guard     General bed mobility comments: for safety and line management  Transfers Overall transfer level: Needs assistance Equipment used: Rolling walker (2 wheeled) Transfers: Sit to/from [facility] Sit to Stand: Min assist Stand pivot transfers: Min assist       General transfer comment: cues for hand placement and safety  Ambulation/Gait Ambulation/Gait assistance: Min guard;Min assist Ambulation Distance (Feet): 100 Feet Assistive device: Rolling walker (2 wheeled) Gait Pattern/deviations: Step-through pattern     General Gait  Details: cues for RW position and safety wtihturns, pt tends to mve very quickly at times  Stairs            Wheelchair Mobility    Modified Rankin (Stroke Patients Only)       Balance Overall balance assessment: Needs assistance;History of Falls Sitting-balance support: Feet unsupported;No upper extremity supported Sitting balance-Leahy Scale: Good Sitting balance - Comments: dons slipper socks in sitting     Standing balance-Leahy Scale: Fair               High level balance activites: Turns;Head turns High Level Balance Comments: shaky (tremors at baseline) and mildly impulsive at times although no overt LOB;              Pertinent Vitals/Pain      Home Living Family/patient expects to be discharged to:: Private residence Living Arrangements: Alone Available Help at Discharge: Friend(s);Available PRN/intermittently Type of Home: House Home Access: Stairs to enter Entrance Stairs-Rails: Can reach both Entrance Stairs-Number of Steps: 2 + 3 Home Layout: One level Home Equipment:  (home O2) Additional Comments: Pt. states she did not use walker, mostly cane prior to this admission; she enjoys walking as much as possible when feeling well and taking care of her dog    Prior Function Level of Independence: Independent         Comments: drives, uses O2 56/8     Hand Dominance        Extremity/Trunk Assessment   Upper Extremity Assessment Upper Extremity Assessment: Overall WFL for tasks assessed    Lower Extremity Assessment Lower Extremity Assessment: Overall WFL for tasks assessed  Communication   Communication: No difficulties  Cognition                                              General Comments      Exercises     Assessment/Plan    PT Assessment Patient needs continued PT services  PT Problem List Decreased activity tolerance;Decreased mobility;Cardiopulmonary status limiting activity       PT  Treatment Interventions DME instruction;Gait training;Functional mobility training;Therapeutic activities;Therapeutic exercise;Patient/family education;Stair training    PT Goals (Current goals can be found in the Care Plan section)  Acute Rehab PT Goals Patient Stated Goal: I DO NOT want to go to rehab PT Goal Formulation: With patient Time For Goal Achievement: 03/03/17 Potential to Achieve Goals: Good    Frequency Min 3X/week   Barriers to discharge        Co-evaluation               AM-PAC PT "6 Clicks" Daily Activity  Outcome Measure Difficulty turning over in bed (including adjusting bedclothes, sheets and blankets)?: A Little Difficulty moving from lying on back to sitting on the side of the bed? : A Little Difficulty sitting down on and standing up from a chair with arms (e.g., wheelchair, bedside commode, etc,.)?: A Little Help needed moving to and from a bed to chair (including a wheelchair)?: A Little Help needed walking in hospital room?: A Little Help needed climbing 3-5 steps with a railing? : A Little 6 Click Score: 18    End of Session Equipment Utilized During Treatment: Gait belt Activity Tolerance: Patient tolerated treatment well Patient left: in chair;with call bell/phone within reach;with nursing/sitter in room;with chair alarm set   PT Visit Diagnosis: Unsteadiness on feet (R26.81)    Time: 8891-6945 PT Time Calculation (min) (ACUTE ONLY): 25 min   Charges:   PT Evaluation $PT Eval Low Complexity: 1 Low PT Treatments $Gait Training: 8-22 mins   PT G CodesKenyon Ana, PT Pager: [phone] 02/24/2017   [facility] 02/24/2017, 10:40 AM

## 2017-02-24 NOTE — Progress Notes (Signed)
PULMONARY / CRITICAL CARE MEDICINE   Name: Michele Martin MRN: [id number] DOB: [birth year]    ADMISSION DATE:  02/22/2017 CONSULTATION DATE:  7/30   REFERRING MD:  Bonner Puna  CHIEF COMPLAINT:  Dyspnea, fatigue  BRIEF: 71 y/o female with extensive stage small cell lung cancer admitted with dyspnea, fatigue, leg swelling, and a pleural effusion on 7/29.     SUBJECTIVE:  Improved breathing Not producing mucus Wants to get out of bed Didn't use BIPAP  VITAL SIGNS: BP (!) 122/52   Pulse 75   Temp 97.9 F (36.6 C) (Oral)   Resp 17   Ht 5\' 1"  (1.549 m)   Wt 118 lb 13.3 oz (53.9 kg)   SpO2 100%   BMI 22.45 kg/m   HEMODYNAMICS:    VENTILATOR SETTINGS:    INTAKE / OUTPUT: I/O last 3 completed shifts: In: 4602.6 [P.O.:360; I.V.:770.5; IV Piggyback:3472.2] Out: 1150 [Urine:1150]  PHYSICAL EXAMINATION: General:  Frail appearing elderly female resting comfortably in bed HENT: NCAT OP clear PULM: Poor air movement, no wheezing, normal effort CV: RRR, no mgr GI: BS+, soft, nontender MSK: normal bulk and tone Neuro: awake, alert, no distress, MAEW   LABS:  BMET  Recent Labs Lab 02/22/17 1625 02/23/17 0522 02/24/17 0326  NA 132* 135 138  K 2.2* 3.5 4.0  CL 69* 100* 101  CO2 41* 26 31  BUN 31* 21* 24*  CREATININE 1.15* 0.57 0.48  GLUCOSE 209* 180* 136*    Electrolytes  Recent Labs Lab 02/22/17 1625 02/22/17 1750 02/22/17 1902 02/23/17 0522 02/24/17 0326  CALCIUM 9.7  --   --  6.9* 8.1*  MG  --  2.2  --   --   --   PHOS  --   --  3.7  --   --     CBC  Recent Labs Lab 02/22/17 1625 02/23/17 0522 02/24/17 0326  WBC 27.4* 17.3* 22.2*  HGB 16.3* 10.6* 10.4*  HCT 45.5 31.6* 31.1*  PLT 400 255 198    Coag's No results for input(s): APTT, INR in the last 168 hours.  Sepsis Markers  Recent Labs Lab 02/23/17 0522 02/23/17 1245 02/23/17 1558  LATICACIDVEN 4.5* 2.2* 3.3*  PROCALCITON  --  0.38  --     ABG No results for input(s): PHART,  PCO2ART, PO2ART in the last 168 hours.  Liver Enzymes  Recent Labs Lab 02/22/17 1625 02/23/17 0522  AST 31 33  ALT 22 18  ALKPHOS 62 37*  BILITOT 1.4* 0.4  ALBUMIN 4.0 2.5*    Cardiac Enzymes  Recent Labs Lab 02/22/17 2127 02/23/17 0056 02/23/17 0522  TROPONINI 0.03* 0.03* <0.03    Glucose No results for input(s): GLUCAP in the last 168 hours.  Imaging Dg Chest Port 1 View  Result Date: 02/24/2017 CLINICAL DATA:  Sepsis.  COPD . EXAM: PORTABLE CHEST 1 VIEW COMPARISON:  02/22/2017. FINDINGS: Mediastinum and hilar structures are normal. Borderline cardiomegaly. Mild bilateral pulmonary interstitial prominence. These changes could be related to CHF and interstitial edema and/or process such as pneumonitis. No pleural effusion or pneumothorax. Cervical spine fusion . IMPRESSION: Borderline cardiomegaly. Mild bilateral pulmonary interstitial prominence. Findings could be related to mild CHF with interstitial edema. Pneumonitis cannot be excluded. Electronically Signed   By: Marcello Moores  Register   On: 02/24/2017 06:37   SIGNIFICANT EVENTS  7/29  Admit   CULTURES BCx2 7/29 >>   ANTIBIOTICS Rocephin 7/29 >>  Azithromycin 7/29 >>   STUDIES:  CT Head 7/29 >> negative  for acute intracranial abnormality, moderate brain parenchymal atrophy and chronic microvascular disease, slight enlargement of calcified R parafalcine anterior frontal meningioma   DISCUSSION: 71 y/o female admitted with a COPD exacerbation.  She is improving but quite frail overall and likely needs inpatient rehab.  ASSESSMENT / PLAN:  PULMONARY A: Acute on chronic respiratory failure with hypoxemia Acute exacerbation of COPD P:   Continue brovana/pulmicort nebulized Resume incruise/Breo at home Change solumedrol to 40mg  IV daily, then change to prednisone tomorrow if feeling better Continue prn short acting albuterol neb Hold daliresp for now Continue O2 as needed to maintain O2 saturation > 90% Out  of bed  INFECTIOUS A:   This patient is NOT septic Lactic acid was due to hypoxemia, work of breathing from COPD She has bronchitis causing an AE COPD, now better, doesn't have mucopurulent sputum production P:   Stop antibiotics Monitor for fever  NEURO: A: Deconditioning P: PT consult Out of bed today  Roselie Awkward, MD [place] [facility] Pager: [phone] Cell: [phone] After 3pm or if no response, call [phone]  02/24/2017, 10:07 AM

## 2017-02-24 NOTE — Care Management Note (Signed)
Case Management Note  Patient Details  Name: Michele Martin MRN: [id number] Date of Birth: Jul 24, 1946  Subjective/Objective:71 y/o f admitted w/Acute resp failure. From home alone. Has home 02-[employer]-has travel tank. Has rw,cane. PT-recc HHPT-patient has used [name] will inform rep Cory-will confirm if able to accept. Await HHPT,f84f order.                    Action/Plan:d/c plan home w/HHC.   Expected Discharge Date:                  Expected Discharge Plan:  [place]  In-House Referral:     Discharge planning Services  CM Consult  Post Acute Care Choice:  Durable Medical Equipment ([employer]-home 02,has cane,rw) Choice offered to:  Patient  DME Arranged:    DME Agency:     HH Arranged:    [place] Agency:     Status of Service:  In process, will continue to follow  If discussed at Long Length of Stay Meetings, dates discussed:    Additional Comments:  Dessa Phi, RN 02/24/2017, 1:13 PM

## 2017-02-24 NOTE — Progress Notes (Addendum)
PROGRESS NOTE  Michele Martin  TDV:[id number] DOB: 1946-04-13 DOA: 02/22/2017 PCP: Biagio Borg, MD   Brief Narrative: Michele Martin is a 71 y.o. female with a history of asthma/COPD and chronic respiratory failure as well as angle closure glaucoma, allergic rhinitis, sinusitis, osteoarthritis, chronic back pain, anxiety/depression, hyperlipidemia, osteopenia, and tremor who presented to the ED via EMS for multiple falls in the setting of worsening dyspnea, wheezing and increased sputum production for 2 days, associated with fatigue and chills but no fever. En route she was given solumedrol and duoneb with ED vital signs afebrile, HR 111, BP 100/67 and saturating well on home 2L by [place].  She appeared to be septic with lactate 4.44, WBC 27.4, Cr 1.15. CXR showed no definite infiltrate. Weight-based IVF's provided and empiric antibiotics for CAP based on clinical presentation were given. Lactic acid rose to 7.1 and BP remained soft, so further IV fluids were provided.  Assessment & Plan: Principal Problem:   Acute on chronic respiratory failure (HCC) Active Problems:   Anxiety and depression   Tobacco abuse   COPD exacerbation (HCC)   Chest pain   Mild cognitive impairment   Hyperlipidemia   Chronic diastolic CHF (congestive heart failure) (HCC)   Chronic respiratory failure with hypoxia and hypercapnia (HCC)   Hypokalemia   Elevated troponin   Malnutrition of moderate degree  Acute on chronic hypoxic respiratory failure: Due to COPD exacerbation +/- pneumonia. - IV steroids (likely culprit of worsening leukocytosis) - Scheduled and prn bronchodilators - Continue singulair, roflumilast per pulmonology - Ambulatory pulse ox prior to discharge  Sepsis ruled out: No source identified. Lactate improved, due to respiratory distress.  - Nearly 4L positive balance yesterday. Vital signs improved, and CXR demonstrating some edema. Will restart lasix today and monitor closely. Has h/o  volume overload in setting of volume resuscitation in previous admissions.  - Trend PCT. - Empiric ceftriaxone and azithromycin stopped per pulm recommendations.  COPD GOLD D: - Followed by [facility] pulmonology, appreciate their assistance. She was recently started on daliresp, changed symbicort, spiriva to breo, incruse. Pt does not think she can afford these newer medications.  Anxiety and depression: Chronic, stable - Continue lexapro, prn clonazepam.   Tobacco abuse: Using e-cigarettes currently - Brief cessation counseling provided - Declined nicotine patch  Mild cognitive impairment:  - Continue aricept 10 mg qHS - Lives alone, frequent falls, wonder if short term rehabilitation may be safer discharge than home.  Hyperlipidemia - Continue statin  Chronic HFpEF: Weight at recent pulm office visit was 103lbs, went 107 > 118lbs (?) here. - Plan to restart lasix today as well as home potassium replacement - Monitor I/O, UOP    Hypokalemia: Resolved with replacement. - Recheck with reinstatement of diuretic.  Demand ischemia: minimally elevated troponin (0 > 0.03 > 0.03 > neg) without chest pain not consistent with ACS.  - Monitor for chest pain  DVT prophylaxis: Lovenox Code Status: Full Family Communication: None at bedside Disposition Plan: Transfer to floor, PT evaluation.  Consultants:   PCCM  Procedures:   Echocardiogram PTA: - Left ventricle: The cavity size was normal. Wall thickness was   normal. Systolic function was normal. The estimated ejection   fraction was in the range of 55% to 60%. Wall motion was normal;   there were no regional wall motion abnormalities. Doppler   parameters are consistent with abnormal left ventricular   relaxation (grade 1 diastolic dysfunction). - Pulmonary arteries: Systolic pressure was mildly increased. PA  peak pressure: 38 mm Hg (S).  Antimicrobials:  Ceftriaxone 7/29 >>  Azithromycin 7/29 >>   Subjective: "I  feel better." Dyspnea is improved. Has been getting up to [facility], eating. Tremors improved. No fevers. Had single episode of watery diarrhea this AM. No fever.  Objective: Vitals:   02/24/17 0919 02/24/17 1057 02/24/17 1455 02/24/17 1457  BP: (!) 122/52 126/64  126/65  Pulse: 75 73  69  Resp: 17 20  19   Temp:  97.9 F (36.6 C)  98.9 F (37.2 C)  TempSrc:  Oral  Axillary  SpO2: 100% 100% 97% 95%  Weight:      Height:        Intake/Output Summary (Last 24 hours) at 02/24/17 1657 Last data filed at 02/24/17 1459  Gross per 24 hour  Intake              290 ml  Output                0 ml  Net              290 ml   Filed Weights   02/22/17 2349 02/24/17 0335  Weight: 48.8 kg (107 lb 9.4 oz) 53.9 kg (118 lb 13.3 oz)    Examination: General exam: 71yo female in no acute distress Respiratory system: Mildly labored on 2L [place]. Very diminished without wheezing today. No crackles. Cardiovascular system: Regular rate and rhythm. No murmur, rub, or gallop. No JVD, and no pedal edema. Gastrointestinal system: Abdomen soft, without tenderness or distention with + bowel sounds. No organomegaly or masses felt. Central nervous system: Alert and oriented. No focal neurological deficits. Extremities: Warm, no deformities Skin: LE's with scattered superficial skin avulsions/abrasions without erythema or drainage. Senile purpura bilateral arms Psychiatry: Judgement and insight appear improved. Mood & affect appropriate.   Data Reviewed: I have personally reviewed following labs and imaging studies  CBC:  Recent Labs Lab 02/22/17 1625 02/23/17 0522 02/24/17 0326  WBC 27.4* 17.3* 22.2*  NEUTROABS 24.4* 16.8*  --   HGB 16.3* 10.6* 10.4*  HCT 45.5 31.6* 31.1*  MCV 94.6 95.8 98.4  PLT 400 255 998   Basic Metabolic Panel:  Recent Labs Lab 02/22/17 1625 02/22/17 1750 02/22/17 1902 02/23/17 0522 02/24/17 0326  NA 132*  --   --  135 138  K 2.2*  --   --  3.5 4.0  CL 69*  --   --  100*  101  CO2 41*  --   --  26 31  GLUCOSE 209*  --   --  180* 136*  BUN 31*  --   --  21* 24*  CREATININE 1.15*  --   --  0.57 0.48  CALCIUM 9.7  --   --  6.9* 8.1*  MG  --  2.2  --   --   --   PHOS  --   --  3.7  --   --    GFR: Estimated Creatinine Clearance: 48 mL/min (by C-G formula based on SCr of 0.48 mg/dL). Liver Function Tests:  Recent Labs Lab 02/22/17 1625 02/23/17 0522  AST 31 33  ALT 22 18  ALKPHOS 62 37*  BILITOT 1.4* 0.4  PROT 6.7 4.3*  ALBUMIN 4.0 2.5*   Cardiac Enzymes:  Recent Labs Lab 02/22/17 2127 02/23/17 0056 02/23/17 0522  TROPONINI 0.03* 0.03* <0.03   BNP (last 3 results)  Recent Labs  01/13/17 1723  PROBNP 42.0   Urine analysis:  Component Value Date/Time   COLORURINE YELLOW 02/22/2017 2115   APPEARANCEUR CLEAR 02/22/2017 2115   LABSPEC 1.010 02/22/2017 2115   PHURINE 6.0 02/22/2017 2115   GLUCOSEU 50 (A) 02/22/2017 2115   GLUCOSEU NEGATIVE 12/29/2016 1239   HGBUR SMALL (A) 02/22/2017 2115   BILIRUBINUR NEGATIVE 02/22/2017 2115   KETONESUR NEGATIVE 02/22/2017 2115   PROTEINUR NEGATIVE 02/22/2017 2115   UROBILINOGEN 0.2 12/29/2016 1239   NITRITE NEGATIVE 02/22/2017 2115   LEUKOCYTESUR TRACE (A) 02/22/2017 2115   Recent Results (from the past 240 hour(s))  Culture, blood (routine x 2)     Status: None (Preliminary result)   Collection Time: 02/22/17  4:02 PM  Result Value Ref Range Status   Specimen Description BLOOD LEFT WRIST  Final   Special Requests   Final    BOTTLES DRAWN AEROBIC ONLY Blood Culture adequate volume   Culture   Final    NO GROWTH 1 DAY Performed at [facility], [place] [address], [place], [place] [address]    Report Status PENDING  Incomplete  Culture, blood (routine x 2)     Status: None (Preliminary result)   Collection Time: 02/22/17  4:05 PM  Result Value Ref Range Status   Specimen Description BLOOD LEFT ARM  Final   Special Requests   Final    BOTTLES DRAWN AEROBIC AND ANAEROBIC Blood Culture  adequate volume   Culture   Final    NO GROWTH 1 DAY Performed at [facility], [place] [address], [place], [place] [address]    Report Status PENDING  Incomplete  MRSA PCR Screening     Status: None   Collection Time: 02/22/17 10:41 PM  Result Value Ref Range Status   MRSA by PCR NEGATIVE NEGATIVE Final    Comment:        The GeneXpert MRSA Assay (FDA approved for NASAL specimens only), is one component of a comprehensive MRSA colonization surveillance program. It is not intended to diagnose MRSA infection nor to guide or monitor treatment for MRSA infections.       Radiology Studies: Ct Head Wo Contrast  Result Date: 02/22/2017 CLINICAL DATA:  Frequent falls. EXAM: CT HEAD WITHOUT CONTRAST TECHNIQUE: Contiguous axial images were obtained from the base of the skull through the vertex without intravenous contrast. COMPARISON:  MRI of the brain 06/20/2015 FINDINGS: Brain: No evidence of acute infarction, hemorrhage, hydrocephalus, extra-axial collection or mass lesion/mass effect. Moderate brain parenchymal volume loss and microangiopathy. Slight enlargement of the calcified right parafalcine anterior frontal meningioma, which now measures 18 by 11 by 18 mm, prior measurement 15 x 11 x 16 mm, 06/20/2015. Vascular: Calcific atherosclerotic disease at the skullbase. Skull: Normal. Negative for fracture or focal lesion. Sinuses/Orbits: No acute finding. Other: None. IMPRESSION: No acute intracranial abnormality. Moderate brain parenchymal atrophy and chronic microvascular disease. Slight enlargement of calcified right parafalcine anterior frontal meningioma, which now measures 18 mm in greatest dimension. Electronically Signed   By: Fidela Salisbury M.D.   On: 02/22/2017 21:07   Dg Chest Port 1 View  Result Date: 02/24/2017 CLINICAL DATA:  Sepsis.  COPD . EXAM: PORTABLE CHEST 1 VIEW COMPARISON:  02/22/2017. FINDINGS: Mediastinum and hilar structures are normal. Borderline  cardiomegaly. Mild bilateral pulmonary interstitial prominence. These changes could be related to CHF and interstitial edema and/or process such as pneumonitis. No pleural effusion or pneumothorax. Cervical spine fusion . IMPRESSION: Borderline cardiomegaly. Mild bilateral pulmonary interstitial prominence. Findings could be related to mild CHF with interstitial  edema. Pneumonitis cannot be excluded. Electronically Signed   By: Marcello Moores  Register   On: 02/24/2017 06:37    Scheduled Meds: . arformoterol  15 mcg Nebulization BID  . aspirin EC  81 mg Oral QHS  . atorvastatin  20 mg Oral QHS  . budesonide (PULMICORT) nebulizer solution  0.5 mg Nebulization BID  . donepezil  10 mg Oral QHS  . enoxaparin (LOVENOX) injection  40 mg Subcutaneous Q24H  . escitalopram  10 mg Oral Daily  . famotidine  20 mg Oral BID  . feeding supplement (ENSURE ENLIVE)  237 mL Oral Q24H  . furosemide  40 mg Oral Daily  . gabapentin  100 mg Oral TID  . guaiFENesin  600 mg Oral BID  . ipratropium-albuterol  3 mL Nebulization TID  . mouth rinse  15 mL Mouth Rinse BID  . [START ON 02/25/2017] methylPREDNISolone (SOLU-MEDROL) injection  40 mg Intravenous Daily  . montelukast  10 mg Oral QHS  . potassium chloride  10 mEq Oral Daily   Continuous Infusions:    LOS: 2 days   Time spent: 25 minutes.  Vance Gather, MD [facility] Pager [phone]  If 7PM-7AM, please contact night-coverage [email or web address] Password [facility] 02/24/2017, 4:57 PM

## 2017-02-24 NOTE — Care Management Note (Signed)
Case Management Note  Patient Details  Name: Michele Martin MRN: [id number] Date of Birth: 05-22-1946  Subjective/Objective: [facility] rep Dian Situ able to accept. Patient agreed to [facility] for HHPT-await f55f.                   Action/Plan:d/c home w/HHPT.   Expected Discharge Date:                  Expected Discharge Plan:  [place]  In-House Referral:     Discharge planning Services  CM Consult  Post Acute Care Choice:  Durable Medical Equipment ([employer]-home 02,has cane,rw) Choice offered to:  Patient  DME Arranged:    DME Agency:     HH Arranged:  PT HH Agency:  [place]  Status of Service:  In process, will continue to follow  If discussed at Long Length of Stay Meetings, dates discussed:    Additional Comments:  Dessa Phi, RN 02/24/2017, 2:43 PM

## 2017-02-24 NOTE — Consult Note (Addendum)
   [facility] Inpatient Consult   02/24/2017  Michele Martin [birth year] [id number]    Patient recently active with [place] Management program. Please see chart review then encounters for further patient outreach details.   Went to bedside to discuss re-engaging with [place] Management. Discussed telephonic follow up versus community [facility] Care Management follow up. Mrs. Grieger states " I want Estill Bamberg again, she was really good and I like her. I want her to check on me and to be sure that I am okay". She states " they now tell me I have CHF".  Discussed [place] [facility] referral as well. Mrs. Coiro endorses that she has had all of her medications and inhalers filled. However, she states " I would like help to  get my inhalers cheaper".  Explained that [place] Management will not interfere or replace services provided by home health.   Mrs. Abbateopaolo expresses appreciation of visit. States " I am glad you found me". Made her aware that [place] team will follow up with her post discharge. Provided contact information and [facility] Care Management brochure. Active [facility] Care Management consent remains on file.  Made inpatient RNCM aware.   Will update [facility] Community team.  Marthenia Rolling, [place], RN,BSN [facility] Liaison [phone]

## 2017-02-25 DIAGNOSIS — E876 Hypokalemia: Secondary | ICD-10-CM

## 2017-02-25 DIAGNOSIS — I5032 Chronic diastolic (congestive) heart failure: Secondary | ICD-10-CM

## 2017-02-25 DIAGNOSIS — Z23 Encounter for immunization: Secondary | ICD-10-CM | POA: Diagnosis not present

## 2017-02-25 LAB — CBC
HEMATOCRIT: 30.2 % — AB (ref 36.0–46.0)
Hemoglobin: 10.2 g/dL — ABNORMAL LOW (ref 12.0–15.0)
MCH: 33.6 pg (ref 26.0–34.0)
MCHC: 33.8 g/dL (ref 30.0–36.0)
MCV: 99.3 fL (ref 78.0–100.0)
Platelets: 228 10*3/uL (ref 150–400)
RBC: 3.04 MIL/uL — AB (ref 3.87–5.11)
RDW: 13.8 % (ref 11.5–15.5)
WBC: 12.2 10*3/uL — AB (ref 4.0–10.5)

## 2017-02-25 LAB — BASIC METABOLIC PANEL
ANION GAP: 7 (ref 5–15)
BUN: 20 mg/dL (ref 6–20)
CHLORIDE: 99 mmol/L — AB (ref 101–111)
CO2: 34 mmol/L — ABNORMAL HIGH (ref 22–32)
Calcium: 8.3 mg/dL — ABNORMAL LOW (ref 8.9–10.3)
Creatinine, Ser: 0.42 mg/dL — ABNORMAL LOW (ref 0.44–1.00)
GFR calc non Af Amer: >60 mL/min (ref >60)
Glucose, Bld: 86 mg/dL (ref 65–99)
POTASSIUM: 3.4 mmol/L — AB (ref 3.5–5.1)
Sodium: 140 mmol/L (ref 135–145)

## 2017-02-25 LAB — PROCALCITONIN: PROCALCITONIN: 0.21 ng/mL

## 2017-02-25 MED ORDER — GUAIFENESIN ER 600 MG PO TB12: 600.0000 mg | ORAL_TABLET | Freq: Two times a day (BID) | ORAL | 0 refills | Status: DC

## 2017-02-25 MED ORDER — PREDNISONE 10 MG PO TABS: ORAL_TABLET | ORAL | 0 refills | Status: DC

## 2017-02-25 MED ORDER — POTASSIUM CHLORIDE CRYS ER 20 MEQ PO TBCR
60.0000 meq | EXTENDED_RELEASE_TABLET | Freq: Once | ORAL | Status: AC
  Administered 2017-02-25: 60 meq via ORAL
  Filled 2017-02-25: qty 3

## 2017-02-25 NOTE — Discharge Summary (Signed)
Physician Discharge Summary  Michele Martin KCL:[id number] DOB: [birth year] DOA: 02/22/2017  PCP: Biagio Borg, MD  Admit date: 02/22/2017 Discharge date: 02/25/2017  Admitted From: Home Disposition:  Home  Recommendations for Outpatient Follow-up:  1. Follow up with PCP in 1 week 2. Please obtain BMP/CBC in one week 3. Follow-up with pulmonary/Dr. Mannam in 1-2 weeks. Please call to make appointment 4. Outpatient follow-up with cardiology in 2-3 weeks. Please call to make appointment   Home Health: Yes with home health PT  Equipment/Devices: Oxygen by nasal cannula   Discharge Condition: Stable CODE STATUS: Full Diet recommendation: Heart Healthy   Brief/Interim Summary: 71 y.o.female with a history of asthma/COPD and chronic respiratory failure as well as angle closure glaucoma, allergic rhinitis, sinusitis, osteoarthritis, chronic back pain, anxiety/depression, hyperlipidemia, osteopenia, and tremor who presented with worsening dyspnea, wheezing and increased sputum production for 2 days. She was admitted with diagnosis of COPD exacerbation and empiric antibiotics were started because of elevated lactate and leukocytosis. Patient has been evaluated by pulmonary. Antibiotics were stopped yesterday by pulmonary as there was no evidence of infection/sepsis. Patient's condition has improved and she is tolerating tapering doses of Solu-Medrol. She wants to go home today and feels much better.   Discharge Diagnoses:  Principal Problem:   Acute on chronic respiratory failure (HCC) Active Problems:   Anxiety and depression   Tobacco abuse   COPD exacerbation (HCC)   Chest pain   Mild cognitive impairment   Hyperlipidemia   Chronic diastolic CHF (congestive heart failure) (HCC)   Chronic respiratory failure with hypoxia and hypercapnia (HCC)   Hypokalemia   Elevated troponin   Malnutrition of moderate degree  Acute on chronic hypoxic respiratory failure: Due to COPD exacerbation   - Improving. Currently on IV Solu-Medrol. Switch to oral prednisone tapering doses today and discharged home. Outpatient follow-up with pulmonary. Continue with [employer]  and Incruz along with Daliresp which was recently started by Pulmonary as an outpatient. Continue as needed albuterol - Continue home oxygen  Sepsis ruled out: No source identified. Lactate improved -Antibiotics were discontinued yesterday by pulmonary. Cultures negative so far  COPD GOLD D: - Followed by [facility] pulmonology - Outpatient follow-up with pulmonary. Continue meds as described above.  Anxiety and depression: Chronic, stable - Continue lexapro, prn clonazepam.   Tobacco abuse: Using e-cigarettes currently - Brief cessation counseling provided   Mild cognitive impairment:  - Continue aricept 10 mg qHS - Lives alone, frequent falls - Home health PT  Hyperlipidemia - Continue statin  Chronic HFpEF:  - Continue home Lasix. Outpatient follow-up with cardiology  Hypokalemia:  - Continue home or potassium supplement. Outpatient follow-up  Demand ischemia: minimally elevated troponin (0 > 0.03 > 0.03 > neg) without chest pain not consistent with ACS.  - Outpatient follow-up with cardiology  Discharge Instructions  Discharge Instructions    AMB Referral to [facility]    Complete by:  As directed    Please re-assign to [facility] for transition of care. Currently at [facility]. Already assigned to [facility] Pharmacist. Please call with questions. Thanks. Marthenia Rolling, [place], RN,BSN [facility] [phone]   Reason for consult:  Please assign to [facility] RNCM   Diagnoses of:   COPD/ Pneumonia Heart Failure     Expected date of contact:  1-3 days (reserved for hospital discharges)   Call MD for:  difficulty breathing, headache or visual disturbances    Complete by:  As directed  Call MD for:  extreme fatigue    Complete by:  As directed    Call MD  for:  persistant dizziness or light-headedness    Complete by:  As directed    Call MD for:  persistant nausea and vomiting    Complete by:  As directed    Call MD for:  severe uncontrolled pain    Complete by:  As directed    Call MD for:  temperature >100.4    Complete by:  As directed    Diet - low sodium heart healthy    Complete by:  As directed    Increase activity slowly    Complete by:  As directed      Allergies as of 02/25/2017      Reactions   Pravastatin Itching, Other (See Comments)   Reaction:  Muscle cramps    Simvastatin Itching, Other (See Comments)   Reaction:  Muscle cramps    Statins Itching, Other (See Comments)   Reaction:  Muscle cramps      Medication List    STOP taking these medications   budesonide-formoterol 160-4.5 MCG/ACT inhaler Commonly known as:  SYMBICORT   dextromethorphan-guaiFENesin 30-600 MG 12hr tablet Commonly known as:  MUCINEX DM   tiotropium 18 MCG inhalation capsule Commonly known as:  SPIRIVA HANDIHALER     TAKE these medications   albuterol 108 (90 Base) MCG/ACT inhaler Commonly known as:  PROVENTIL HFA Inhale 2 puffs into the lungs every 6 (six) hours as needed for wheezing or shortness of breath.   albuterol (2.5 MG/3ML) 0.083% nebulizer solution Commonly known as:  PROVENTIL Take 3 mLs (2.5 mg total) by nebulization every 6 (six) hours as needed for wheezing or shortness of breath.   aspirin EC 81 MG tablet Take 81 mg by mouth at bedtime.   atorvastatin 20 MG tablet Commonly known as:  LIPITOR Take 1 tablet (20 mg total) by mouth at bedtime.   b complex vitamins tablet Take 1 tablet by mouth at bedtime.   clonazePAM 0.5 MG tablet Commonly known as:  KLONOPIN Take 1 tablet (0.5 mg total) by mouth 2 (two) times daily as needed for anxiety.   donepezil 10 MG tablet Commonly known as:  ARICEPT Take 1 tablet (10 mg total) by mouth at bedtime.   escitalopram 20 MG tablet Commonly known as:  LEXAPRO Take 0.5  tablets (10 mg total) by mouth daily.   fluticasone furoate-vilanterol 200-25 MCG/INH Aepb Commonly known as:  BREO ELLIPTA Inhale 1 puff into the lungs daily.   furosemide 40 MG tablet Commonly known as:  LASIX Take 1 tablet (40 mg total) by mouth daily.   gabapentin 100 MG capsule Commonly known as:  NEURONTIN TAKE 1 CAPSULE BY MOUTH THREE TIMES DAILY   guaiFENesin 600 MG 12 hr tablet Commonly known as:  MUCINEX Take 1 tablet (600 mg total) by mouth 2 (two) times daily.   hydrochlorothiazide 25 MG tablet Commonly known as:  HYDRODIURIL Take 25 mg by mouth daily.   montelukast 10 MG tablet Commonly known as:  SINGULAIR TAKE 1 TABLET BY MOUTH EVERY NIGHT AT BEDTIME   potassium chloride 10 MEQ tablet Commonly known as:  K-DUR Take 1 tablet (10 mEq total) by mouth daily.   predniSONE 10 MG tablet Commonly known as:  DELTASONE 4 tabs daily x2 days, 3 tabs x 2 days, 2 tabs x 2 days, 1 tabs x 2 days then stop What changed:  additional instructions   PROBIOTIC PO Take  1 capsule by mouth daily.   roflumilast 500 MCG Tabs tablet Commonly known as:  DALIRESP Take 1 tablet (500 mcg total) by mouth daily. What changed:  Another medication with the same name was removed. Continue taking this medication, and follow the directions you see here.   umeclidinium bromide 62.5 MCG/INH Aepb Commonly known as:  INCRUSE ELLIPTA Inhale 1 puff into the lungs daily.      Follow-up Information    [place], [facility] Follow up.   Specialty:  [place] Why:  HH physical therapy Contact information: [place] [address] [place], [place] Follow up.   Specialty:  Home Health Services Contact information: [place] [address] [phone]        Biagio Borg, MD Follow up in 1 week(s).   Specialties:  Internal Medicine, Radiology Contact information: [place] [address] [phone]        Marshell Garfinkel, MD Follow up in 2 week(s).   Specialty:  Pulmonary Disease Contact information: [address] [place] [address] [phone]          Allergies  Allergen Reactions  . Pravastatin Itching and Other (See Comments)    Reaction:  Muscle cramps   . Simvastatin Itching and Other (See Comments)    Reaction:  Muscle cramps   . Statins Itching and Other (See Comments)    Reaction:  Muscle cramps    Consultations:  Pulmonary   Procedures/Studies: Ct Head Wo Contrast  Result Date: 02/22/2017 CLINICAL DATA:  Frequent falls. EXAM: CT HEAD WITHOUT CONTRAST TECHNIQUE: Contiguous axial images were obtained from the base of the skull through the vertex without intravenous contrast. COMPARISON:  MRI of the brain 06/20/2015 FINDINGS: Brain: No evidence of acute infarction, hemorrhage, hydrocephalus, extra-axial collection or mass lesion/mass effect. Moderate brain parenchymal volume loss and microangiopathy. Slight enlargement of the calcified right parafalcine anterior frontal meningioma, which now measures 18 by 11 by 18 mm, prior measurement 15 x 11 x 16 mm, 06/20/2015. Vascular: Calcific atherosclerotic disease at the skullbase. Skull: Normal. Negative for fracture or focal lesion. Sinuses/Orbits: No acute finding. Other: None. IMPRESSION: No acute intracranial abnormality. Moderate brain parenchymal atrophy and chronic microvascular disease. Slight enlargement of calcified right parafalcine anterior frontal meningioma, which now measures 18 mm in greatest dimension. Electronically Signed   By: Fidela Salisbury M.D.   On: 02/22/2017 21:07   Dg Chest Port 1 View  Result Date: 02/24/2017 CLINICAL DATA:  Sepsis.  COPD . EXAM: PORTABLE CHEST 1 VIEW COMPARISON:  02/22/2017. FINDINGS: Mediastinum and hilar structures are normal. Borderline cardiomegaly. Mild bilateral pulmonary interstitial prominence. These  changes could be related to CHF and interstitial edema and/or process such as pneumonitis. No pleural effusion or pneumothorax. Cervical spine fusion . IMPRESSION: Borderline cardiomegaly. Mild bilateral pulmonary interstitial prominence. Findings could be related to mild CHF with interstitial edema. Pneumonitis cannot be excluded. Electronically Signed   By: Marcello Moores  Register   On: 02/24/2017 06:37   Dg Chest Port 1 View  Result Date: 02/22/2017 CLINICAL DATA:  Shortness of breath for 3 weeks.  History of asthma. EXAM: PORTABLE CHEST 1 VIEW COMPARISON:  01/13/2017 FINDINGS: Cardiomediastinal silhouette is normal. Mediastinal contours appear intact. Calcific atherosclerotic disease and tortuosity of the aorta. There is no evidence of focal airspace consolidation, pleural effusion or pneumothorax. Upper lobe predominant emphysema. Osseous  structures are without acute abnormality. Partially visualized cervical spine fusion. Soft tissues are grossly normal. IMPRESSION: Upper lobe predominant emphysema. Calcific atherosclerotic disease of the aorta. No evidence of focal airspace consolidation. Electronically Signed   By: Fidela Salisbury M.D.   On: 02/22/2017 16:55        Subjective: Patient seen and examined at bedside. She feels better much better and wants to go home. She denies any overnight fever, nausea or vomiting.  Discharge Exam: Vitals:   02/24/17 2052 02/25/17 0455  BP: (!) 145/82 128/65  Pulse: 84 70  Resp: 16 16  Temp: 98.2 F (36.8 C) 98.7 F (37.1 C)   Vitals:   02/24/17 1457 02/24/17 2052 02/24/17 2106 02/25/17 0455  BP: 126/65 (!) 145/82  128/65  Pulse: 69 84  70  Resp: 19 16  16   Temp: 98.9 F (37.2 C) 98.2 F (36.8 C)  98.7 F (37.1 C)  TempSrc: Axillary Oral  Oral  SpO2: 95% 98% 99% 100%  Weight:    52.1 kg (114 lb 13.8 oz)  Height:        General: Pt is alert, awake, not in acute distress Cardiovascular: Rate controlled, S1-S2 positive Respiratory: Bilateral  decreased breath sounds at bases with some scattered crackles Abdominal: Soft, NT, ND, bowel sounds + Extremities: no edema, no cyanosis    The results of significant diagnostics from this hospitalization (including imaging, microbiology, ancillary and laboratory) are listed below for reference.     Microbiology: Recent Results (from the past 240 hour(s))  Culture, blood (routine x 2)     Status: None (Preliminary result)   Collection Time: 02/22/17  4:02 PM  Result Value Ref Range Status   Specimen Description BLOOD LEFT WRIST  Final   Special Requests   Final    BOTTLES DRAWN AEROBIC ONLY Blood Culture adequate volume   Culture   Final    NO GROWTH 1 DAY Performed at [facility], [address], [place], [place] [address]    Report Status PENDING  Incomplete  Culture, blood (routine x 2)     Status: None (Preliminary result)   Collection Time: 02/22/17  4:05 PM  Result Value Ref Range Status   Specimen Description BLOOD LEFT ARM  Final   Special Requests   Final    BOTTLES DRAWN AEROBIC AND ANAEROBIC Blood Culture adequate volume   Culture   Final    NO GROWTH 1 DAY Performed at [facility], [place] [address], [place],  [address]    Report Status PENDING  Incomplete  MRSA PCR Screening     Status: None   Collection Time: 02/22/17 10:41 PM  Result Value Ref Range Status   MRSA by PCR NEGATIVE NEGATIVE Final    Comment:        The GeneXpert MRSA Assay (FDA approved for NASAL specimens only), is one component of a comprehensive MRSA colonization surveillance program. It is not intended to diagnose MRSA infection nor to guide or monitor treatment for MRSA infections.      Labs: BNP (last 3 results)  Recent Labs  07/24/16 1933 07/26/16 1225 08/04/16 2242  BNP 40.6 44.2 00.8   Basic Metabolic Panel:  Recent Labs Lab 02/22/17 1625 02/22/17 1750 02/22/17 1902 02/23/17 0522 02/24/17 0326 02/25/17 0507  NA 132*  --   --  135 138 140  K  2.2*  --   --  3.5 4.0 3.4*  CL 69*  --   --  100* 101 99*  CO2 41*  --   --  26 31 34*  GLUCOSE 209*  --   --  180* 136* 86  BUN 31*  --   --  21* 24* 20  CREATININE 1.15*  --   --  0.57 0.48 0.42*  CALCIUM 9.7  --   --  6.9* 8.1* 8.3*  MG  --  2.2  --   --   --   --   PHOS  --   --  3.7  --   --   --    Liver Function Tests:  Recent Labs Lab 02/22/17 1625 02/23/17 0522  AST 31 33  ALT 22 18  ALKPHOS 62 37*  BILITOT 1.4* 0.4  PROT 6.7 4.3*  ALBUMIN 4.0 2.5*   No results for input(s): LIPASE, AMYLASE in the last 168 hours. No results for input(s): AMMONIA in the last 168 hours. CBC:  Recent Labs Lab 02/22/17 1625 02/23/17 0522 02/24/17 0326 02/25/17 0507  WBC 27.4* 17.3* 22.2* 12.2*  NEUTROABS 24.4* 16.8*  --   --   HGB 16.3* 10.6* 10.4* 10.2*  HCT 45.5 31.6* 31.1* 30.2*  MCV 94.6 95.8 98.4 99.3  PLT 400 255 198 228   Cardiac Enzymes:  Recent Labs Lab 02/22/17 2127 02/23/17 0056 02/23/17 0522  TROPONINI 0.03* 0.03* <0.03   BNP: Invalid input(s): POCBNP CBG: No results for input(s): GLUCAP in the last 168 hours. D-Dimer No results for input(s): DDIMER in the last 72 hours. Hgb A1c No results for input(s): HGBA1C in the last 72 hours. Lipid Profile No results for input(s): CHOL, HDL, LDLCALC, TRIG, CHOLHDL, LDLDIRECT in the last 72 hours. Thyroid function studies No results for input(s): TSH, T4TOTAL, T3FREE, THYROIDAB in the last 72 hours.  Invalid input(s): FREET3 Anemia work up No results for input(s): VITAMINB12, FOLATE, FERRITIN, TIBC, IRON, RETICCTPCT in the last 72 hours. Urinalysis    Component Value Date/Time   COLORURINE YELLOW 02/22/2017 2115   APPEARANCEUR CLEAR 02/22/2017 2115   LABSPEC 1.010 02/22/2017 2115   PHURINE 6.0 02/22/2017 2115   GLUCOSEU 50 (A) 02/22/2017 2115   GLUCOSEU NEGATIVE 12/29/2016 1239   HGBUR SMALL (A) 02/22/2017 2115   BILIRUBINUR NEGATIVE 02/22/2017 2115   KETONESUR NEGATIVE 02/22/2017 2115   PROTEINUR  NEGATIVE 02/22/2017 2115   UROBILINOGEN 0.2 12/29/2016 1239   NITRITE NEGATIVE 02/22/2017 2115   LEUKOCYTESUR TRACE (A) 02/22/2017 2115   Sepsis Labs Invalid input(s): PROCALCITONIN,  WBC,  LACTICIDVEN Microbiology Recent Results (from the past 240 hour(s))  Culture, blood (routine x 2)     Status: None (Preliminary result)   Collection Time: 02/22/17  4:02 PM  Result Value Ref Range Status   Specimen Description BLOOD LEFT WRIST  Final   Special Requests   Final    BOTTLES DRAWN AEROBIC ONLY Blood Culture adequate volume   Culture   Final    NO GROWTH 1 DAY Performed at [facility], [place] [address] [place], [place] [address]    Report Status PENDING  Incomplete  Culture, blood (routine x 2)     Status: None (Preliminary result)   Collection Time: 02/22/17  4:05 PM  Result Value Ref Range Status   Specimen Description BLOOD LEFT ARM  Final   Special Requests   Final    BOTTLES DRAWN AEROBIC AND ANAEROBIC Blood Culture adequate volume   Culture   Final    NO GROWTH 1 DAY Performed at [facility], [place]  [address], [place], [place] [address]    Report Status PENDING  Incomplete  MRSA PCR Screening     Status: None   Collection Time: 02/22/17 10:41 PM  Result Value Ref Range Status   MRSA by PCR NEGATIVE NEGATIVE Final    Comment:        The GeneXpert MRSA Assay (FDA approved for NASAL specimens only), is one component of a comprehensive MRSA colonization surveillance program. It is not intended to diagnose MRSA infection nor to guide or monitor treatment for MRSA infections.      Time coordinating discharge: 35 minutes  SIGNED:   Aline August, MD  [facility] 02/25/2017, 9:22 AM Pager: [phone]  If 7PM-7AM, please contact night-coverage [email or web address] Password [email or web address]

## 2017-02-25 NOTE — Care Management Note (Signed)
Case Management Note  Patient Details  Name: JENNET SCROGGIN MRN: [id number] Date of Birth: 03/02/1946  Subjective/Objective:  No further CM needs.                  Action/Plan:d/c home w/HHC.   Expected Discharge Date:  02/25/17               Expected Discharge Plan:  [place]  In-House Referral:     Discharge planning Services  CM Consult  Post Acute Care Choice:  Durable Medical Equipment ([employer]-home 02,has cane,rw) Choice offered to:  Patient  DME Arranged:    DME Agency:     HH Arranged:  PT [place]:  [place]  Status of Service:  Completed, signed off  If discussed at [place] of Stay Meetings, dates discussed:    Additional Comments:  Dessa Phi, RN 02/25/2017, 10:56 AM

## 2017-02-25 NOTE — Evaluation (Signed)
Occupational Therapy Evaluation Patient Details Name: Michele Martin MRN: [id number] DOB: 23-Sep-1945 Today's Date: 02/25/2017    History of Present Illness Michele Martin is a 71 y.o. female with a history of asthma/COPD and chronic respiratory failure as well as angle closure glaucoma, allergic rhinitis, sinusitis, osteoarthritis, chronic back pain, anxiety/depression, hyperlipidemia, osteopenia, and tremor who presented to the ED via EMS for multiple falls in the setting of worsening dyspnea   Clinical Impression   Pt was admitted for the above. She plans discharge home today. Educated on energy conservation and she will benefit from continued HHOT to restore baseline independent level for basic adls and some home management    Follow Up Recommendations  HHOT;Supervision - Intermittent    Equipment Recommendations  None recommended by OT (pt does not want 3:1 commode)    Recommendations for Other Services       Precautions / Restrictions Precautions Precautions: Fall Restrictions Weight Bearing Restrictions: No      Mobility Bed Mobility               General bed mobility comments: eob  Transfers   Equipment used: None   Sit to Stand: Supervision              Balance                                           ADL either performed or assessed with clinical judgement   ADL Overall ADL's : Needs assistance/impaired     Grooming: Set up;Sitting   Upper Body Bathing: Set up;Sitting   Lower Body Bathing: Minimal assistance;Sit to/from stand   Upper Body Dressing : Set up;Sitting   Lower Body Dressing: Moderate assistance;Sit to/from stand   Toilet Transfer: Min guard;Ambulation;BSC. For safety. Pt a little unsteady but no LOB   Toileting- Clothing Manipulation and Hygiene: Supervision/safety;Sit to/from stand         General ADL Comments: educated on energy conservation and gave her handouts. Pt tends to lean forward  for adls. Educated on use of reacher to conserve energy.  Pt can have granddaughter present when she showers.  Her granddaughter usually brings her a meal.  Pt with increased WOB during session     Vision         Perception     Praxis      Pertinent Vitals/Pain Pain Assessment: No/denies pain     Hand Dominance     Extremity/Trunk Assessment Upper Extremity Assessment Upper Extremity Assessment: Overall WFL for tasks assessed           Communication Communication Communication: No difficulties   Cognition Arousal/Alertness: Awake/alert Behavior During Therapy: WFL for tasks assessed/performed Overall Cognitive Status: Within Functional Limits for tasks assessed                                     General Comments       Exercises     Shoulder Instructions      Home Living Family/patient expects to be discharged to:: Private residence Living Arrangements: Alone Available Help at Discharge: Friend(s);Available PRN/intermittently               Bathroom Shower/Tub: Tub/shower unit   Bathroom Toilet: Standard     Home Equipment: Grab bars - toilet;Grab bars - tub/shower;Shower  seat          Prior Functioning/Environment Level of Independence: Independent                 OT Problem List:        OT Treatment/Interventions:      OT Goals(Current goals can be found in the care plan section) Acute Rehab OT Goals Patient Stated Goal: home; get back to independence OT Goal Formulation: All assessment and education complete, DC therapy  OT Frequency:     Barriers to D/C:            Co-evaluation              AM-PAC PT "6 Clicks" Daily Activity     Outcome Measure Help from another person eating meals?: None Help from another person taking care of personal grooming?: A Little Help from another person toileting, which includes using toliet, bedpan, or urinal?: A Little Help from another person bathing (including  washing, rinsing, drying)?: A Little Help from another person to put on and taking off regular upper body clothing?: A Little Help from another person to put on and taking off regular lower body clothing?: A Little 6 Click Score: 19   End of Session    Activity Tolerance: Patient tolerated treatment well Patient left: with bed alarm set (eob)  OT Visit Diagnosis: Muscle weakness (generalized) (M62.81);Unsteadiness on feet (R26.81)                Time: 7591-6384 OT Time Calculation (min): 21 min Charges:  OT General Charges $OT Visit: 1 Procedure OT Evaluation $OT Eval Low Complexity: 1 Procedure G-Codes:     Thomson, OTR/L [phone] 02/25/2017  Kaeli Nichelson 02/25/2017, 12:16 PM

## 2017-02-26 ENCOUNTER — Telehealth: Payer: Self-pay | Admitting: *Deleted

## 2017-02-26 DIAGNOSIS — Z87440 Personal history of urinary (tract) infections: Secondary | ICD-10-CM | POA: Diagnosis not present

## 2017-02-26 DIAGNOSIS — G4733 Obstructive sleep apnea (adult) (pediatric): Secondary | ICD-10-CM | POA: Diagnosis not present

## 2017-02-26 DIAGNOSIS — J441 Chronic obstructive pulmonary disease with (acute) exacerbation: Secondary | ICD-10-CM | POA: Diagnosis not present

## 2017-02-26 DIAGNOSIS — M810 Age-related osteoporosis without current pathological fracture: Secondary | ICD-10-CM | POA: Diagnosis not present

## 2017-02-26 DIAGNOSIS — Z9981 Dependence on supplemental oxygen: Secondary | ICD-10-CM | POA: Diagnosis not present

## 2017-02-26 DIAGNOSIS — Z7982 Long term (current) use of aspirin: Secondary | ICD-10-CM | POA: Diagnosis not present

## 2017-02-26 DIAGNOSIS — I5033 Acute on chronic diastolic (congestive) heart failure: Secondary | ICD-10-CM | POA: Diagnosis not present

## 2017-02-26 NOTE — Telephone Encounter (Signed)
Transition Care Management Follow-up Telephone Call   Date discharged? 02/25/17   How have you been since you were released from the hospital? Pt states she is doing alright just a little tired   Do you understand why you were in the hospital? YES   Do you understand the discharge instructions? YES   Where were you discharged to? Home   Items Reviewed:  Medications reviewed: YES  Allergies reviewed: YES  Dietary changes reviewed: NO  Referrals reviewed: YES, she states her appts to see pulmonology an heart MD has been schedule   Functional Questionnaire:   Activities of Daily Living (ADLs):   She states she are independent in the following: bathing and hygiene, feeding, continence, grooming, toileting and dressing States she require assistance with the following: ambulation   Any transportation issues/concerns?: NO   Any patient concerns? NO   Confirmed importance and date/time of follow-up visits scheduled YES. appt 03/04/17  Provider Appointment booked with Dr. Jenny Reichmann  Confirmed with patient if condition begins to worsen call PCP or go to the ER.  Patient was given the office number and encouraged to call back with question or concerns.  : YES

## 2017-02-27 ENCOUNTER — Other Ambulatory Visit: Payer: Self-pay | Admitting: Pharmacist

## 2017-02-27 ENCOUNTER — Other Ambulatory Visit: Payer: Self-pay

## 2017-02-27 ENCOUNTER — Telehealth: Payer: Self-pay | Admitting: Internal Medicine

## 2017-02-27 NOTE — Patient Outreach (Signed)
Transition of care:  Placed 2nd call to patient for transition of care. Patient reports that she is doing well. Reports that she is weak and tired. Reports her son is coming to spend the weekend with her and assist as needed.  Reports that she has all her medications and is taking them as prescribed. Reports that [facility] home [facility] is coming to see her.  Reports that she has follow up planned with her primary MD on 03/04/2017.  PLAN:  Placed call to [facility] and spoke with [place] who states that physical therapist saw patient on 02/26/2017 and has requested nursing evaluation from MD.  Reviewed with patient transition of care program. Will plan next outreach in 1 week.   THN CM Care Plan Problem One     Most Recent Value  Care Plan Problem One  Recent admission for COPD exacerbation  Role Documenting the Problem One  Care Management [place] for Problem One  Active  THN Long Term Goal   Patient will report no readmission for COPD for the next 60 days.   THN Long Term Goal Start Date  02/27/17  Interventions for Problem One Long Term Goal  Reviewed transiton of care program. Answered all questions. Placed call to home health to confirm orders.   THN CM Short Term Goal #1   Patient will take all medications as prescribed in the next 30 days.   THN CM Short Term Goal #1 Start Date  02/27/17  Interventions for Short Term Goal #1  Reviewed importance of taking all medications as prescribed.   THN CM Short Term Goal #2   Patient will follow up with primary MD in the next 7 days.   THN CM Short Term Goal #2 Start Date  02/27/17  Interventions for Short Term Goal #2  Reviewed importance of attending follow up appointments. Confirmed transportation.      Tomasa Rand, RN, BSN, CEN [facility] [employer] [phone]

## 2017-02-27 NOTE — Telephone Encounter (Signed)
Would like verbals for physical therapy 2week6  Would like verbals for a nursing evaluation for med management

## 2017-02-27 NOTE — Patient Outreach (Signed)
Receive a call back from [facility]. HIPAA identifiers verified and verbal consent received.  Ms. Hoe reports that she has entered the coverage gap of her insurance and that she would like help with affording her Breo and Incruse inhalers.   Complete extra help application with the patient over the phone. Receive permission to submit the application based on the answers that she provides. Advise patient that she should expect to receive a response in the mail in 2-4 weeks. Advise patient that if she receives a denial letter, she should retain this letter to be used for applying for assistance through the medication manufacturer. Patient verbalizes understanding and states that she will give me a call as soon as she receives this letter from [employer]. Confirm that patient has my phone number saved.  Note that both Breo and Incruse are [place] products. Review requirements of the [place] assistance program with the patient. Patient reports that she meets both the income and out-of-pocket requirements for this program. Let Ms. Avvatepaolo know that I will mail her this application.  Patient denies any further medication questions/concerns at this time.  PLAN:  1) Patient to notify me once she receives a response from [employer].  2) Pharmacy to mail patient a copy of the [place] for you patient assistance program application.  Harlow Asa, PharmD, [place] Management [phone]

## 2017-02-27 NOTE — Telephone Encounter (Signed)
Ok for verbals 

## 2017-02-27 NOTE — Patient Outreach (Signed)
Michele Martin was referred to pharmacy again for medication assistance for a new medication. Left a HIPAA compliant message on the patient's voicemail. If have not heard from patient by next week, will give her another call at that time.  Harlow Asa, PharmD, [place] Management [phone]

## 2017-02-27 NOTE — Patient Outreach (Signed)
Transition of care:  New referral.  Previous patient.  Placed call to patient. No answer. No machine.  PLAN: will continue to attempt outreach.  Tomasa Rand, RN, BSN, CEN [facility] [employer] [phone]

## 2017-02-28 LAB — CULTURE, BLOOD (ROUTINE X 2)
CULTURE: NO GROWTH
Culture: NO GROWTH
Special Requests: ADEQUATE
Special Requests: ADEQUATE

## 2017-03-02 ENCOUNTER — Encounter: Payer: Self-pay | Admitting: Cardiology

## 2017-03-03 ENCOUNTER — Other Ambulatory Visit: Payer: Self-pay | Admitting: Pharmacy Technician

## 2017-03-03 ENCOUNTER — Telehealth: Payer: Self-pay | Admitting: Pulmonary Disease

## 2017-03-03 MED ORDER — FLUTICASONE FUROATE-VILANTEROL 200-25 MCG/INH IN AEPB: 1.0000 | INHALATION_SPRAY | Freq: Every day | RESPIRATORY_TRACT | 6 refills | Status: DC

## 2017-03-03 MED ORDER — UMECLIDINIUM BROMIDE 62.5 MCG/INH IN AEPB: 1.0000 | INHALATION_SPRAY | Freq: Every day | RESPIRATORY_TRACT | 6 refills | Status: AC

## 2017-03-03 NOTE — Telephone Encounter (Signed)
Verbal orders via [facility] VM

## 2017-03-03 NOTE — Patient Outreach (Signed)
[facility]) Care Management  03/03/2017  Michele Martin 03-16-1946 [id number]   I contacted patient today in reference to [place] patient assistance for Breo and Incruse. I explained to the patient that I have filled out all of the information that I could and highlighted what I needed her to fill out. I inquired if she would be able to complete her portion on her own or if she needed assistance. The patient states she can fill it out and she will obtain her out of pocket cost from her pharmacy. I let her know that the application is being sent today and to be on the lookout for it.  [name], [place] [phone]

## 2017-03-03 NOTE — Telephone Encounter (Addendum)
lmtcb x1 for [employer] with [employer]

## 2017-03-03 NOTE — Telephone Encounter (Signed)
[place] with [facility] is returning phone call.

## 2017-03-03 NOTE — Telephone Encounter (Signed)
Spoke with [facility] Rile Needing two hard copy Rx faxed to [phone]  Patient assistance has been started for this patient for both Breo and Incruse.  Rxs printed and placed in Dr Vaughan Browner look at for his signature  Will send to [facility] to ensure these get faxed to ATTN: [place] as soon as they are signed.

## 2017-03-03 NOTE — Patient Outreach (Signed)
[facility]) Care Management  03/03/2017  OVAL MORALEZ 04-20-1946 [id number]   Contacted Dr. Matilde Bash office to request hardcopy prescription's for [place] and Incruse to send to [place] for patient assistance.  [name], [place] [phone]

## 2017-03-04 ENCOUNTER — Ambulatory Visit (INDEPENDENT_AMBULATORY_CARE_PROVIDER_SITE_OTHER): Payer: Medicare Other | Admitting: Internal Medicine

## 2017-03-04 ENCOUNTER — Encounter: Payer: Self-pay | Admitting: Internal Medicine

## 2017-03-04 VITALS — BP 116/66 | HR 115 | Ht 61.0 in | Wt 102.0 lb

## 2017-03-04 DIAGNOSIS — I5032 Chronic diastolic (congestive) heart failure: Secondary | ICD-10-CM | POA: Diagnosis not present

## 2017-03-04 DIAGNOSIS — J9612 Chronic respiratory failure with hypercapnia: Secondary | ICD-10-CM

## 2017-03-04 DIAGNOSIS — J9611 Chronic respiratory failure with hypoxia: Secondary | ICD-10-CM

## 2017-03-04 DIAGNOSIS — J441 Chronic obstructive pulmonary disease with (acute) exacerbation: Secondary | ICD-10-CM | POA: Diagnosis not present

## 2017-03-04 NOTE — Telephone Encounter (Signed)
Rx will be given to PM for signature on 8/13 when he returns to clinic.

## 2017-03-05 ENCOUNTER — Other Ambulatory Visit: Payer: Self-pay

## 2017-03-05 DIAGNOSIS — G4733 Obstructive sleep apnea (adult) (pediatric): Secondary | ICD-10-CM | POA: Diagnosis not present

## 2017-03-05 DIAGNOSIS — I5033 Acute on chronic diastolic (congestive) heart failure: Secondary | ICD-10-CM | POA: Diagnosis not present

## 2017-03-05 DIAGNOSIS — Z7982 Long term (current) use of aspirin: Secondary | ICD-10-CM | POA: Diagnosis not present

## 2017-03-05 DIAGNOSIS — M810 Age-related osteoporosis without current pathological fracture: Secondary | ICD-10-CM | POA: Diagnosis not present

## 2017-03-05 DIAGNOSIS — Z87440 Personal history of urinary (tract) infections: Secondary | ICD-10-CM | POA: Diagnosis not present

## 2017-03-05 DIAGNOSIS — Z9981 Dependence on supplemental oxygen: Secondary | ICD-10-CM | POA: Diagnosis not present

## 2017-03-05 DIAGNOSIS — J441 Chronic obstructive pulmonary disease with (acute) exacerbation: Secondary | ICD-10-CM | POA: Diagnosis not present

## 2017-03-05 NOTE — Patient Outreach (Signed)
Transition of care: Placed call to patient to follow up on progress. Patient reports that she is doing okay.  Sounds short of breath with me on the phone. States to me that she just finished walking with the physical therapist.  Reports that she is using her nebulizer. Denies any other concerns today.  Patient reports that she saw her MD yesterday and was placed on prednisone.   PLAN: Offered home visit and patient accepted for August 13th at 11am. Confirmed address.  Encouraged patient to take her medications as prescribed.  Tomasa Rand, RN, BSN, CEN [facility] [employer] [phone]

## 2017-03-06 DIAGNOSIS — Z9981 Dependence on supplemental oxygen: Secondary | ICD-10-CM | POA: Diagnosis not present

## 2017-03-06 DIAGNOSIS — G4733 Obstructive sleep apnea (adult) (pediatric): Secondary | ICD-10-CM | POA: Diagnosis not present

## 2017-03-06 DIAGNOSIS — Z87440 Personal history of urinary (tract) infections: Secondary | ICD-10-CM | POA: Diagnosis not present

## 2017-03-06 DIAGNOSIS — I5033 Acute on chronic diastolic (congestive) heart failure: Secondary | ICD-10-CM | POA: Diagnosis not present

## 2017-03-06 DIAGNOSIS — Z7982 Long term (current) use of aspirin: Secondary | ICD-10-CM | POA: Diagnosis not present

## 2017-03-06 DIAGNOSIS — J441 Chronic obstructive pulmonary disease with (acute) exacerbation: Secondary | ICD-10-CM | POA: Diagnosis not present

## 2017-03-06 DIAGNOSIS — M810 Age-related osteoporosis without current pathological fracture: Secondary | ICD-10-CM | POA: Diagnosis not present

## 2017-03-06 NOTE — Progress Notes (Signed)
Subjective:    Patient ID: Michele Martin, female    DOB: 07-16-1946, 71 y.o.   MRN: [id number]  HPI  Here to f/u recent hospn 7/29-8/2 with COPD exacerbation +/- pulm edema, tx with some diuresis as well as o2, nebs,  IV antibx, IV steroid and able to be relatively quickly weaned off all IV tx.  Seen per cardiology and pulmonary with symptoms resolved to baseline by d/c, with f/u labs, pulm and cardiology f/u recommended.    D/c with short course prednisone with mild worsening wob/sob/doe in the last 2days despite inhaler use.  Also mentions persistent balance problem, and tends to fall forward and backward at times.  Has home PT to start soon. No falls post d/c.  Pt denies chest pain, orthopnea, PND, increased LE swelling, palpitations, or syncope.  Pt denies new neurological symptoms such as new headache, or facial or extremity weakness or numbness   Pt denies polydipsia, polyuria Past Medical History:  Diagnosis Date  . Acute angle-closure glaucoma 02/14/2010  . ALLERGIC RHINITIS 09/19/2009  . ANXIETY 04/15/2010  . Arthritis   . ASTHMA 09/19/2009  . BACK PAIN 02/14/2010  . Chronic bronchitis 04/14/2011  . CHRONIC OBSTRUCTIVE PULMONARY DISEASE, ACUTE EXACERBATION 12/19/2009  . Complication of anesthesia   . COPD 06/27/2009  . DEPRESSION 09/19/2009  . HOARSENESS 10/17/2009  . HYPERLIPIDEMIA 09/19/2009  . MENOPAUSAL DISORDER 08/20/2010  . OSTEOPENIA 09/10/2010  . Osteoporosis, unspecified 04/20/2014  . Rotator cuff tear 06/23/2011  . TINNITUS 09/10/2010  . TREMOR 02/14/2010  . Tremor 07/30/2011  . URI 09/10/2010  . Wheezing 06/27/2009   Past Surgical History:  Procedure Laterality Date  . ABDOMINAL HYSTERECTOMY    . cervical fusion 2013 - Dr Vertell Limber    . Normal Heart Cath  2001   Dr. Johnsie Cancel  . s/p laser tx for glaucoma     no vision loss    reports that she has been smoking Cigarettes.  She started smoking about 20 months ago. She has a 25.00 pack-year smoking history. She has never used  smokeless tobacco. She reports that she does not drink alcohol or use drugs. family history includes Dementia in her mother; Rheum arthritis in her maternal uncle. Allergies  Allergen Reactions  . Pravastatin Itching and Other (See Comments)    Reaction:  Muscle cramps   . Simvastatin Itching and Other (See Comments)    Reaction:  Muscle cramps   . Statins Itching and Other (See Comments)    Reaction:  Muscle cramps   Current Outpatient Prescriptions on File Prior to Visit  Medication Sig Dispense Refill  . albuterol (PROVENTIL HFA) 108 (90 Base) MCG/ACT inhaler Inhale 2 puffs into the lungs every 6 (six) hours as needed for wheezing or shortness of breath. 18 g 5  . albuterol (PROVENTIL) (2.5 MG/3ML) 0.083% nebulizer solution Take 3 mLs (2.5 mg total) by nebulization every 6 (six) hours as needed for wheezing or shortness of breath. 150 mL 1  . aspirin EC 81 MG tablet Take 81 mg by mouth at bedtime.    [name] atorvastatin (LIPITOR) 20 MG tablet Take 1 tablet (20 mg total) by mouth at bedtime. 90 tablet 1  . b complex vitamins tablet Take 1 tablet by mouth at bedtime.    . clonazePAM (KLONOPIN) 0.5 MG tablet Take 1 tablet (0.5 mg total) by mouth 2 (two) times daily as needed for anxiety. 60 tablet 5  . donepezil (ARICEPT) 10 MG tablet Take 1 tablet (10 mg total) by  mouth at bedtime.    [name] escitalopram (LEXAPRO) 20 MG tablet Take 0.5 tablets (10 mg total) by mouth daily. 45 tablet 3  . fluticasone furoate-vilanterol (BREO ELLIPTA) 200-25 MCG/INH AEPB Inhale 1 puff into the lungs daily. 60 each 6  . furosemide (LASIX) 40 MG tablet Take 1 tablet (40 mg total) by mouth daily. 90 tablet 3  . gabapentin (NEURONTIN) 100 MG capsule TAKE 1 CAPSULE BY MOUTH THREE TIMES DAILY 90 capsule 11  . guaiFENesin (MUCINEX) 600 MG 12 hr tablet Take 1 tablet (600 mg total) by mouth 2 (two) times daily. 20 tablet 0  . hydrochlorothiazide (HYDRODIURIL) 25 MG tablet Take 25 mg by mouth daily.    . montelukast (SINGULAIR)  10 MG tablet TAKE 1 TABLET BY MOUTH EVERY NIGHT AT BEDTIME 30 tablet 11  . potassium chloride (K-DUR) 10 MEQ tablet Take 1 tablet (10 mEq total) by mouth daily. 90 tablet 3  . predniSONE (DELTASONE) 10 MG tablet 4 tabs daily x2 days, 3 tabs x 2 days, 2 tabs x 2 days, 1 tabs x 2 days then stop 20 tablet 0  . Probiotic Product (PROBIOTIC PO) Take 1 capsule by mouth daily.    . roflumilast (DALIRESP) 500 MCG TABS tablet Take 1 tablet (500 mcg total) by mouth daily. 30 tablet 6  . umeclidinium bromide (INCRUSE ELLIPTA) 62.5 MCG/INH AEPB Inhale 1 puff into the lungs daily. 30 each 6   No current facility-administered medications on file prior to visit.    Review of Systems  Constitutional: Negative for other unusual diaphoresis or sweats HENT: Negative for ear discharge or swelling Eyes: Negative for other worsening visual disturbances Respiratory: Negative for stridor or other swelling  Gastrointestinal: Negative for worsening distension or other blood Genitourinary: Negative for retention or other urinary change Musculoskeletal: Negative for other MSK pain or swelling Skin: Negative for color change or other new lesions Neurological: Negative for worsening tremors and other numbness  Psychiatric/Behavioral: Negative for worsening agitation or other fatigue All other system neg per pt    Objective:   Physical Exam BP 116/66   Pulse (!) 115   Ht 5\' 1"  (1.549 m)   Wt 102 lb (46.3 kg)   SpO2 95%   BMI 19.27 kg/m  VS noted, not ill appaering Constitutional: Pt appears in NAD HENT: Head: NCAT.  Right Ear: External ear normal.  Left Ear: External ear normal.  Eyes: . Pupils are equal, round, and reactive to light. Conjunctivae and EOM are normal Nose: without d/c or deformity Bilat tm's with mild erythema.  Max sinus areas non tender.  Pharynx with mild erythema, no exudate Neck: Neck supple. Gross normal ROM Cardiovascular: Normal rate and regular rhythm.   Pulmonary/Chest: Effort  normal and breath sounds decreased without rales but with few scattered bilat wheezing.  Neurological: Pt is alert. At baseline orientation, motor grossly intact Skin: Skin is warm. No rashes, other new lesions, no LE edema Psychiatric: Pt behavior is normal without agitation  No other exam findings Lab Results  Component Value Date   WBC 12.2 (H) 02/25/2017   HGB 10.2 (L) 02/25/2017   HCT 30.2 (L) 02/25/2017   PLT 228 02/25/2017   GLUCOSE 86 02/25/2017   CHOL 222 (H) 08/09/2015   TRIG 95.0 08/09/2015   HDL 89.50 08/09/2015   LDLDIRECT 164.4 07/19/2013   LDLCALC 114 (H) 08/09/2015   ALT 18 02/23/2017   AST 33 02/23/2017   NA 140 02/25/2017   K 3.4 (L) 02/25/2017  CL 99 (L) 02/25/2017   CREATININE 0.42 (L) 02/25/2017   BUN 20 02/25/2017   CO2 34 (H) 02/25/2017   TSH 0.55 10/28/2016   HGBA1C 5.6 08/01/2011       Assessment & Plan:

## 2017-03-06 NOTE — Assessment & Plan Note (Signed)
Stable, to cont home o2

## 2017-03-06 NOTE — Assessment & Plan Note (Signed)
Overall improved, for repeat longer course prednisone, f/u pulm as planned, cont inhalers

## 2017-03-06 NOTE — Patient Instructions (Signed)
Please take all new medication as prescribed - the prednisone  Please continue all other medications as before, and refills have been done if requested.  Please have the pharmacy call with any other refills you may need.  Please keep your appointments with your specialists as you may have planned

## 2017-03-06 NOTE — Assessment & Plan Note (Signed)
stable overall by history and exam, and pt to continue medical treatment as before,  to f/u any worsening symptoms or concerns 

## 2017-03-09 ENCOUNTER — Other Ambulatory Visit: Payer: Self-pay

## 2017-03-09 NOTE — Telephone Encounter (Signed)
Rx has been signed by PM and faxed to provided number. Received successful fax confirmation. Nothing further needed.   

## 2017-03-09 NOTE — Patient Outreach (Signed)
Michele Martin [facility]) Care Management   03/09/2017  Michele Martin [birth year] [id number]  Michele Martin is an 71 y.o. female 11am Arrived for home visit. Patient wearing oxygen and appears winded. Subjective: Patient reports that she has had a hear time with her breathing recently.  Reports being placed on new medications that are very expensive. Reports that she fell about 3 weeks ago while on the phone with her son. Reports that her legs gave way.  Reports she was in the hospital in the ICU for a while. Reports now that she is home with home health PT.  States that she is managing as best as she can. Reports that her son lives in [place] but has granddaughters who live close by.  Patient reports that she misses her daughter who died about a year ago.  Patient reports that she takes it easy at home most of the time. Reports she is able to drive herself to her MD appointments and the store.   Objective:  Thin appearing. Awake and alert.  Vitals:   03/09/17 1124  BP: (!) 106/52  Pulse: (!) 118  Resp: 20  SpO2: 98%  Weight: 100 lb 12.8 oz (45.7 kg)  Height: 1.549 m (_0 )   Review of Systems  Constitutional: Positive for malaise/fatigue.  HENT: Negative.   Eyes: Negative.   Respiratory: Positive for cough, sputum production, shortness of breath and wheezing.   Cardiovascular:       Feels heart beating fast.  Gastrointestinal: Negative.   Genitourinary: Positive for frequency.  Musculoskeletal: Positive for falls.  Skin:       Fragile skin with bruising.   Neurological: Negative.   Endo/Heme/Allergies: Bruises/bleeds easily.  Psychiatric/Behavioral: Positive for depression.       Reports that she feels sad due to the anniversary of her daughters death.     Physical Exam  Constitutional: She is oriented to person, place, and time.  Thin appearing  Cardiovascular: Normal rate, regular rhythm, normal heart sounds and intact distal pulses.   Respiratory: Effort  normal. She has wheezes.  GI: Soft. Bowel sounds are normal.  Musculoskeletal: Normal range of motion. She exhibits no edema.  Neurological: She is alert and oriented to person, place, and time.  2018, [place], Monday.  August.   Skin: Skin is warm and dry.  Bruises to arms and legs.    Psychiatric: She has a normal mood and affect. Her behavior is normal. Judgment and thought content normal.    Encounter Medications:   Outpatient Encounter Prescriptions as of 03/09/2017  Medication Sig  . albuterol (PROVENTIL HFA) 108 (90 Base) MCG/ACT inhaler Inhale 2 puffs into the lungs every 6 (six) hours as needed for wheezing or shortness of breath.  [name] albuterol (PROVENTIL) (2.5 MG/3ML) 0.083% nebulizer solution Take 3 mLs (2.5 mg total) by nebulization every 6 (six) hours as needed for wheezing or shortness of breath.  [name] aspirin EC 81 MG tablet Take 81 mg by mouth at bedtime.  [name] atorvastatin (LIPITOR) 20 MG tablet Take 1 tablet (20 mg total) by mouth at bedtime.  [name] b complex vitamins tablet Take 1 tablet by mouth at bedtime.  . clonazePAM (KLONOPIN) 0.5 MG tablet Take 1 tablet (0.5 mg total) by mouth 2 (two) times daily as needed for anxiety.  . donepezil (ARICEPT) 10 MG tablet Take 1 tablet (10 mg total) by mouth at bedtime.  [name] escitalopram (LEXAPRO) 20 MG tablet Take 0.5 tablets (10 mg total) by mouth daily.  [name]  fluticasone furoate-vilanterol (BREO ELLIPTA) 200-25 MCG/INH AEPB Inhale 1 puff into the lungs daily.  . furosemide (LASIX) 40 MG tablet Take 1 tablet (40 mg total) by mouth daily.  [name] gabapentin (NEURONTIN) 100 MG capsule TAKE 1 CAPSULE BY MOUTH THREE TIMES DAILY  . guaiFENesin (MUCINEX) 600 MG 12 hr tablet Take 1 tablet (600 mg total) by mouth 2 (two) times daily.  . hydrochlorothiazide (HYDRODIURIL) 25 MG tablet Take 25 mg by mouth daily.  . montelukast (SINGULAIR) 10 MG tablet TAKE 1 TABLET BY MOUTH EVERY NIGHT AT BEDTIME  . potassium chloride (K-DUR) 10 MEQ tablet Take 1 tablet (10 mEq  total) by mouth daily.  . predniSONE (DELTASONE) 10 MG tablet 4 tabs daily x2 days, 3 tabs x 2 days, 2 tabs x 2 days, 1 tabs x 2 days then stop  . Probiotic Product (PROBIOTIC PO) Take 1 capsule by mouth daily.  [name] umeclidinium bromide (INCRUSE ELLIPTA) 62.5 MCG/INH AEPB Inhale 1 puff into the lungs daily.  . roflumilast (DALIRESP) 500 MCG TABS tablet Take 1 tablet (500 mcg total) by mouth daily. (Patient not taking: Reported on 03/09/2017)   No facility-administered encounter medications on file as of 03/09/2017.     Functional Status:   In your present state of health, do you have any difficulty performing the following activities: 03/09/2017 02/23/2017  Hearing? N N  Vision? N N  Difficulty concentrating or making decisions? N Y  Walking or climbing stairs? Y Y  Comment - respitory difficulty  Dressing or bathing? N N  Doing errands, shopping? N N  Preparing Food and eating ? N -  Using the Toilet? N -  In the past six months, have you accidently leaked urine? N -  Do you have problems with loss of bowel control? N -  Managing your Medications? Y -  Comment too expensive -  Managing your Finances? Y -  Housekeeping or managing your Housekeeping? Y -  Some recent data might be hidden    Fall/Depression Screening:    Fall Risk  03/09/2017 12/18/2016 12/17/2016  Falls in the past year? Yes No No  Number falls in past yr: 1 - -  Injury with Fall? Yes - -  Risk Factor Category  - - -  Risk for fall due to : History of fall(s) - -  Follow up Falls prevention discussed - -   PHQ 2/9 Scores 03/09/2017 12/18/2016 11/20/2016 09/30/2016 09/30/2016 09/22/2016 08/21/2016  PHQ - 2 Score 6 6 0 2 1 0 0  PHQ- 9 Score 9 13 - 6 - - -    Assessment:   (1) reviewed [facility] program. Reviewed [facility] consent on file and patient wishes to change consent.  New consent obtained. Provided my contact card.  Patient has magnet on the refrigerator and is using [facility] calendar for weight log.  (2) nutritional high risk. (3)  positive depression screening. (4) problems paying for medications. (5) CHF: weighing daily and following low salt diet.  (6) COPD exacerbation:  Wheezing today. Reports taking medications as prescribed but is out of [place].   Plan:  (1) new consent scanned into chart. (2) reviewed importance of good nutrition. (3) reviewed depression and importance of taking medications as prescribed and being as active as possible. Reviewed support systems. (4) reviewed [facility] pharmacy notes. Reviewed with patient to call pharmacist when she got notification from extra help. Provided written contact information for [facility] pharmacist. Encouraged patient not to run out of medications due to cost. (5) reviewed  heart failure zones and when to call MD.  (6) Encouraged patient to pick up her medications from pharmacy today. States she will.    THN CM Care Plan Problem One     Most Recent Value  Care Plan Problem One  Recent admission for COPD exacerbation  Role Documenting the Problem One  Care Management [place] for Problem One  Active  THN Long Term Goal   Patient will report no readmission for COPD for the next 60 days.   THN Long Term Goal Start Date  02/27/17  Interventions for Problem One Long Term Goal  home visit completed. Encouraged patient to call her MD for worsening condition.Reviewed COPD zones.   THN CM Short Term Goal #1   Patient will take all medications as prescribed in the next 30 days.   THN CM Short Term Goal #1 Start Date  02/27/17  Interventions for Short Term Goal #1  Encouraged patient to pick up her medications ( dailresp) today. Sent an inbasket message to [place].   THN CM Short Term Goal #2   Patient will follow up with primary MD in the next 7 days.   THN CM Short Term Goal #2 Start Date  02/27/17  [facility] CM Short Term Goal #2 Met Date  03/05/17  Interventions for Short Term Goal #2  Reviewed importance of attending follow up appointments. Confirmed transportation.        Will route this note to MD.   Next follow up in 1 week.  Tomasa Rand, RN, BSN, CEN [facility] [employer] [phone]

## 2017-03-10 ENCOUNTER — Telehealth: Payer: Self-pay | Admitting: Internal Medicine

## 2017-03-10 DIAGNOSIS — Z9981 Dependence on supplemental oxygen: Secondary | ICD-10-CM | POA: Diagnosis not present

## 2017-03-10 DIAGNOSIS — G4733 Obstructive sleep apnea (adult) (pediatric): Secondary | ICD-10-CM | POA: Diagnosis not present

## 2017-03-10 DIAGNOSIS — Z87440 Personal history of urinary (tract) infections: Secondary | ICD-10-CM | POA: Diagnosis not present

## 2017-03-10 DIAGNOSIS — J441 Chronic obstructive pulmonary disease with (acute) exacerbation: Secondary | ICD-10-CM | POA: Diagnosis not present

## 2017-03-10 DIAGNOSIS — Z7982 Long term (current) use of aspirin: Secondary | ICD-10-CM | POA: Diagnosis not present

## 2017-03-10 DIAGNOSIS — I5033 Acute on chronic diastolic (congestive) heart failure: Secondary | ICD-10-CM | POA: Diagnosis not present

## 2017-03-10 DIAGNOSIS — M810 Age-related osteoporosis without current pathological fracture: Secondary | ICD-10-CM | POA: Diagnosis not present

## 2017-03-10 NOTE — Telephone Encounter (Signed)
Called stating,Pt just had a episode of vomiting and after vomiting she had a HR of 128 after she sat for a little it is down at 98 and her BP is 98/62 Feels poorly today and feels no better after vomiting  She would like a call back

## 2017-03-10 NOTE — Telephone Encounter (Signed)
Dr John please advise.  

## 2017-03-10 NOTE — Telephone Encounter (Signed)
I am not sure what to say as the vomiting can be from several different things;  If not now improved, and the HR cont's elevated and feeling poorly, she would need the ED asap

## 2017-03-10 NOTE — Telephone Encounter (Signed)
PT states that she is doing better but has been advised to go to the ED if symptoms worsen.

## 2017-03-11 ENCOUNTER — Other Ambulatory Visit: Payer: Self-pay | Admitting: Pharmacy Technician

## 2017-03-11 DIAGNOSIS — I5033 Acute on chronic diastolic (congestive) heart failure: Secondary | ICD-10-CM | POA: Diagnosis not present

## 2017-03-11 DIAGNOSIS — G4733 Obstructive sleep apnea (adult) (pediatric): Secondary | ICD-10-CM | POA: Diagnosis not present

## 2017-03-11 DIAGNOSIS — M810 Age-related osteoporosis without current pathological fracture: Secondary | ICD-10-CM | POA: Diagnosis not present

## 2017-03-11 DIAGNOSIS — Z9981 Dependence on supplemental oxygen: Secondary | ICD-10-CM | POA: Diagnosis not present

## 2017-03-11 DIAGNOSIS — Z87440 Personal history of urinary (tract) infections: Secondary | ICD-10-CM | POA: Diagnosis not present

## 2017-03-11 DIAGNOSIS — Z7982 Long term (current) use of aspirin: Secondary | ICD-10-CM | POA: Diagnosis not present

## 2017-03-11 DIAGNOSIS — J441 Chronic obstructive pulmonary disease with (acute) exacerbation: Secondary | ICD-10-CM | POA: Diagnosis not present

## 2017-03-11 NOTE — Patient Outreach (Signed)
[facility]) Care Management  03/11/2017  Michele Martin July 29, 1945 [id number]   I attempted to contact patient at the request of Harlow Asa, [place]. I initially spoke to patient on 03-03-2017 in reference to patient assistance application that I sent to her. I spoke with [name] today who informed me that the patient has not received the application as of yet. I left a voicemail for the patient to contact me so I can make sure she hasn't overlooked the application and to inquire if I need to send a new application for her to complete.  [name], [place] [phone]

## 2017-03-12 DIAGNOSIS — Z7982 Long term (current) use of aspirin: Secondary | ICD-10-CM | POA: Diagnosis not present

## 2017-03-12 DIAGNOSIS — I5033 Acute on chronic diastolic (congestive) heart failure: Secondary | ICD-10-CM | POA: Diagnosis not present

## 2017-03-12 DIAGNOSIS — M810 Age-related osteoporosis without current pathological fracture: Secondary | ICD-10-CM | POA: Diagnosis not present

## 2017-03-12 DIAGNOSIS — Z87440 Personal history of urinary (tract) infections: Secondary | ICD-10-CM | POA: Diagnosis not present

## 2017-03-12 DIAGNOSIS — G4733 Obstructive sleep apnea (adult) (pediatric): Secondary | ICD-10-CM | POA: Diagnosis not present

## 2017-03-12 DIAGNOSIS — Z9981 Dependence on supplemental oxygen: Secondary | ICD-10-CM | POA: Diagnosis not present

## 2017-03-12 DIAGNOSIS — J441 Chronic obstructive pulmonary disease with (acute) exacerbation: Secondary | ICD-10-CM | POA: Diagnosis not present

## 2017-03-13 ENCOUNTER — Ambulatory Visit (INDEPENDENT_AMBULATORY_CARE_PROVIDER_SITE_OTHER)
Admission: RE | Admit: 2017-03-13 | Discharge: 2017-03-13 | Disposition: A | Discharge status: Home / Self Care | Payer: Medicare Other | Source: Ambulatory Visit | Attending: Internal Medicine | Admitting: Internal Medicine

## 2017-03-13 ENCOUNTER — Encounter: Payer: Self-pay | Admitting: Internal Medicine

## 2017-03-13 ENCOUNTER — Other Ambulatory Visit: Payer: Self-pay | Admitting: Internal Medicine

## 2017-03-13 ENCOUNTER — Ambulatory Visit (INDEPENDENT_AMBULATORY_CARE_PROVIDER_SITE_OTHER): Payer: Medicare Other | Admitting: Internal Medicine

## 2017-03-13 VITALS — BP 104/64 | HR 115 | Temp 98.4°F | Ht 61.0 in | Wt 100.0 lb

## 2017-03-13 DIAGNOSIS — J449 Chronic obstructive pulmonary disease, unspecified: Secondary | ICD-10-CM | POA: Diagnosis not present

## 2017-03-13 DIAGNOSIS — G4733 Obstructive sleep apnea (adult) (pediatric): Secondary | ICD-10-CM | POA: Diagnosis not present

## 2017-03-13 DIAGNOSIS — R0781 Pleurodynia: Secondary | ICD-10-CM

## 2017-03-13 DIAGNOSIS — R000 Tachycardia, unspecified: Secondary | ICD-10-CM | POA: Diagnosis not present

## 2017-03-13 DIAGNOSIS — M810 Age-related osteoporosis without current pathological fracture: Secondary | ICD-10-CM | POA: Diagnosis not present

## 2017-03-13 DIAGNOSIS — Z7982 Long term (current) use of aspirin: Secondary | ICD-10-CM | POA: Diagnosis not present

## 2017-03-13 DIAGNOSIS — I5033 Acute on chronic diastolic (congestive) heart failure: Secondary | ICD-10-CM | POA: Diagnosis not present

## 2017-03-13 DIAGNOSIS — Z87440 Personal history of urinary (tract) infections: Secondary | ICD-10-CM | POA: Diagnosis not present

## 2017-03-13 DIAGNOSIS — R079 Chest pain, unspecified: Secondary | ICD-10-CM | POA: Diagnosis not present

## 2017-03-13 DIAGNOSIS — Z9981 Dependence on supplemental oxygen: Secondary | ICD-10-CM | POA: Diagnosis not present

## 2017-03-13 DIAGNOSIS — J441 Chronic obstructive pulmonary disease with (acute) exacerbation: Secondary | ICD-10-CM | POA: Diagnosis not present

## 2017-03-13 MED ORDER — TRAMADOL HCL 50 MG PO TABS: 50.0000 mg | ORAL_TABLET | Freq: Three times a day (TID) | ORAL | 1 refills | Status: DC | PRN

## 2017-03-13 NOTE — Progress Notes (Signed)
Subjective:    Patient ID: Michele Martin, female    DOB: [birth year], 71 y.o.   MRN: [id number]  HPI Here to f/u; overall doing ok,  Pt denies chest pain, increasing sob or doe, wheezing, orthopnea, PND, increased LE swelling, palpitations, dizziness or syncope.  Pt denies new neurological symptoms such as new headache, or facial or extremity weakness or numbness.  Pt denies polydipsia, polyuria, or low sugar episode.  Pt states overall good compliance with meds, except not wearing o2 in the exam b/c the ribs hurt more with the heavy tank, and c/o pain/tenderness to lower costal margins bilat left > right.  Pt found to be tachycardic after walking to nurse station for VS  No new complaints Past Medical History:  Diagnosis Date  . Acute angle-closure glaucoma 02/14/2010  . ALLERGIC RHINITIS 09/19/2009  . ANXIETY 04/15/2010  . Arthritis   . ASTHMA 09/19/2009  . BACK PAIN 02/14/2010  . Chronic bronchitis 04/14/2011  . CHRONIC OBSTRUCTIVE PULMONARY DISEASE, ACUTE EXACERBATION 12/19/2009  . Complication of anesthesia   . COPD 06/27/2009  . DEPRESSION 09/19/2009  . HOARSENESS 10/17/2009  . HYPERLIPIDEMIA 09/19/2009  . MENOPAUSAL DISORDER 08/20/2010  . OSTEOPENIA 09/10/2010  . Osteoporosis, unspecified 04/20/2014  . Rotator cuff tear 06/23/2011  . TINNITUS 09/10/2010  . TREMOR 02/14/2010  . Tremor 07/30/2011  . URI 09/10/2010  . Wheezing 06/27/2009   Past Surgical History:  Procedure Laterality Date  . ABDOMINAL HYSTERECTOMY    . cervical fusion 2013 - Dr Vertell Limber    . Normal Heart Cath  2001   Dr. Johnsie Cancel  . s/p laser tx for glaucoma     no vision loss    reports that she has quit smoking. Her smoking use included Cigarettes. She started smoking about 20 months ago. She has a 25.00 pack-year smoking history. She has never used smokeless tobacco. She reports that she does not drink alcohol or use drugs. family history includes Dementia in her mother; Rheum arthritis in her maternal uncle. Allergies    Allergen Reactions  . Pravastatin Itching and Other (See Comments)    Reaction:  Muscle cramps   . Simvastatin Itching and Other (See Comments)    Reaction:  Muscle cramps   . Statins Itching and Other (See Comments)    Reaction:  Muscle cramps   Current Outpatient Prescriptions on File Prior to Visit  Medication Sig Dispense Refill  . albuterol (PROVENTIL HFA) 108 (90 Base) MCG/ACT inhaler Inhale 2 puffs into the lungs every 6 (six) hours as needed for wheezing or shortness of breath. 18 g 5  . albuterol (PROVENTIL) (2.5 MG/3ML) 0.083% nebulizer solution Take 3 mLs (2.5 mg total) by nebulization every 6 (six) hours as needed for wheezing or shortness of breath. 150 mL 1  . aspirin EC 81 MG tablet Take 81 mg by mouth at bedtime.    [name] atorvastatin (LIPITOR) 20 MG tablet Take 1 tablet (20 mg total) by mouth at bedtime. 90 tablet 1  . b complex vitamins tablet Take 1 tablet by mouth at bedtime.    . clonazePAM (KLONOPIN) 0.5 MG tablet Take 1 tablet (0.5 mg total) by mouth 2 (two) times daily as needed for anxiety. 60 tablet 5  . donepezil (ARICEPT) 10 MG tablet Take 1 tablet (10 mg total) by mouth at bedtime.    [name] escitalopram (LEXAPRO) 20 MG tablet Take 0.5 tablets (10 mg total) by mouth daily. 45 tablet 3  . fluticasone furoate-vilanterol (BREO ELLIPTA) 200-25 MCG/INH AEPB  Inhale 1 puff into the lungs daily. 60 each 6  . furosemide (LASIX) 40 MG tablet Take 1 tablet (40 mg total) by mouth daily. 90 tablet 3  . gabapentin (NEURONTIN) 100 MG capsule TAKE 1 CAPSULE BY MOUTH THREE TIMES DAILY 90 capsule 11  . guaiFENesin (MUCINEX) 600 MG 12 hr tablet Take 1 tablet (600 mg total) by mouth 2 (two) times daily. 20 tablet 0  . hydrochlorothiazide (HYDRODIURIL) 25 MG tablet Take 25 mg by mouth daily.    . montelukast (SINGULAIR) 10 MG tablet TAKE 1 TABLET BY MOUTH EVERY NIGHT AT BEDTIME 30 tablet 11  . potassium chloride (K-DUR) 10 MEQ tablet Take 1 tablet (10 mEq total) by mouth daily. 90 tablet 3   . predniSONE (DELTASONE) 10 MG tablet 4 tabs daily x2 days, 3 tabs x 2 days, 2 tabs x 2 days, 1 tabs x 2 days then stop 20 tablet 0  . Probiotic Product (PROBIOTIC PO) Take 1 capsule by mouth daily.    . roflumilast (DALIRESP) 500 MCG TABS tablet Take 1 tablet (500 mcg total) by mouth daily. 30 tablet 6  . umeclidinium bromide (INCRUSE ELLIPTA) 62.5 MCG/INH AEPB Inhale 1 puff into the lungs daily. 30 each 6   No current facility-administered medications on file prior to visit.     Review of Systems  Constitutional: Negative for other unusual diaphoresis or sweats HENT: Negative for ear discharge or swelling Eyes: Negative for other worsening visual disturbances Respiratory: Negative for stridor or other swelling  Gastrointestinal: Negative for worsening distension or other blood Genitourinary: Negative for retention or other urinary change Musculoskeletal: Negative for other MSK pain or swelling Skin: Negative for color change or other new lesions Neurological: Negative for worsening tremors and other numbness  Psychiatric/Behavioral: Negative for worsening agitation or other fatigue All other system neg per pt    Objective:   Physical Exam BP 104/64   Pulse (!) 115   Temp 98.4 F (36.9 C)   Ht 5\' 1"  (1.549 m)   Wt 100 lb (45.4 kg)   SpO2 95%   BMI 18.89 kg/m  - tachycardia noted VS noted,  Constitutional: Pt appears in NAD HENT: Head: NCAT.  Right Ear: External ear normal.  Left Ear: External ear normal.  Eyes: . Pupils are equal, round, and reactive to light. Conjunctivae and EOM are normal Nose: without d/c or deformity Neck: Neck supple. Gross normal ROM Cardiovascular: Normal rate and regular rhythm.   Pulmonary/Chest: Effort normal and breath sounds without rales or wheezing.  Abd:  Soft, NT, ND, + BS, no organomegaly with mild tender bilat costal margins without swelling or rash Neurological: Pt is alert. At baseline orientation, motor grossly intact Skin: Skin is  warm. No rashes, other new lesions, no LE edema Psychiatric: Pt behavior is normal without agitation  No other exam findings  Lab Results  Component Value Date   WBC 12.2 (H) 02/25/2017   HGB 10.2 (L) 02/25/2017   HCT 30.2 (L) 02/25/2017   PLT 228 02/25/2017   GLUCOSE 86 02/25/2017   CHOL 222 (H) 08/09/2015   TRIG 95.0 08/09/2015   HDL 89.50 08/09/2015   LDLDIRECT 164.4 07/19/2013   LDLCALC 114 (H) 08/09/2015   ALT 18 02/23/2017   AST 33 02/23/2017   NA 140 02/25/2017   K 3.4 (L) 02/25/2017   CL 99 (L) 02/25/2017   CREATININE 0.42 (L) 02/25/2017   BUN 20 02/25/2017   CO2 34 (H) 02/25/2017   TSH 0.55 10/28/2016  HGBA1C 5.6 08/01/2011   ECG today I have personally interpreted Sinus  Rhythm  - LAA, LVH - 98    Assessment & Plan:

## 2017-03-13 NOTE — Patient Instructions (Addendum)
Your hear rhythm is not Afib today, and the rate has improved with resting  Please make sure to wear your oxygen at all times  Please take all new medication as prescribed - the pain medication  Please continue all other medications as before, and refills have been done if requested.  Please have the pharmacy call with any other refills you may need.  Please keep your appointments with your specialists as you may have planned  Please go to the XRAY Department in the Basement (go straight as you get off the elevator) for the x-ray testing  You will be contacted by phone if any changes need to be made immediately.  Otherwise, you will receive a letter about your results with an explanation, but please check with MyChart first.  Please remember to sign up for MyChart if you have not done so, as this will be important to you in the future with finding out test results, communicating by private email, and scheduling acute appointments online when needed.

## 2017-03-15 DIAGNOSIS — R000 Tachycardia, unspecified: Secondary | ICD-10-CM | POA: Insufficient documentation

## 2017-03-15 NOTE — Assessment & Plan Note (Signed)
Mild after ambulating in without o2, resolved with rest, ECG reviewed without acute changes, cont to follow, encourage compliance with o2 tank wearing

## 2017-03-15 NOTE — Assessment & Plan Note (Signed)
Mild, for pain control, rib films,  to f/u any worsening symptoms or concerns

## 2017-03-15 NOTE — Assessment & Plan Note (Signed)
stable overall by history and exam, and pt to continue medical treatment as before,  to f/u any worsening symptoms or concerns 

## 2017-03-16 ENCOUNTER — Other Ambulatory Visit: Payer: Self-pay | Admitting: Pharmacist

## 2017-03-16 ENCOUNTER — Other Ambulatory Visit: Payer: Self-pay

## 2017-03-16 DIAGNOSIS — Z87440 Personal history of urinary (tract) infections: Secondary | ICD-10-CM | POA: Diagnosis not present

## 2017-03-16 DIAGNOSIS — J441 Chronic obstructive pulmonary disease with (acute) exacerbation: Secondary | ICD-10-CM | POA: Diagnosis not present

## 2017-03-16 DIAGNOSIS — Z9981 Dependence on supplemental oxygen: Secondary | ICD-10-CM | POA: Diagnosis not present

## 2017-03-16 DIAGNOSIS — G4733 Obstructive sleep apnea (adult) (pediatric): Secondary | ICD-10-CM | POA: Diagnosis not present

## 2017-03-16 DIAGNOSIS — I5033 Acute on chronic diastolic (congestive) heart failure: Secondary | ICD-10-CM | POA: Diagnosis not present

## 2017-03-16 DIAGNOSIS — Z7982 Long term (current) use of aspirin: Secondary | ICD-10-CM | POA: Diagnosis not present

## 2017-03-16 DIAGNOSIS — M810 Age-related osteoporosis without current pathological fracture: Secondary | ICD-10-CM | POA: Diagnosis not present

## 2017-03-16 NOTE — Patient Outreach (Signed)
[facility]) Care Management  03/16/2017  Michele Martin 01/09/1946 [id number]   I spoke with patient last week and she did receive the previous application's that I sent and she is in the process of filling them out. She spoke with [name], [place] and also inquired about patient assistance for Daliresp (AZ&ME) which in [name] showed that she had stopped taking. At my request [name] followed up with the patient and found that she is in need of financial help to afford this medication. I am mailing out the application to the patient today.  [name], [place] [phone]

## 2017-03-16 NOTE — Patient Outreach (Signed)
Call to follow up with Ezzard Flax regarding medication assistance. Speak with patient. HIPAA identifiers verified and verbal consent received.  Patient reports that she has not yet received a response back from Social Security in response to her extra help application, but lets me know that she will continue to look for this in the mail. Reports that she is currently working on the patient assistance paperwork for her inhalers with [employer] Patient reports that she would also like to apply for patient assistance for Daliresp.   PLAN  1) Patient assistance application for Daliresp will be sent out to patient by [place]  2) Ms. Priest to give me a call when she receives her response back from [employer] about the extra help application.  3) Will continue to follow patient as Pharmacy Technician works with patient on patient assistance applications.  Harlow Asa, PharmD, [facility] [phone]

## 2017-03-16 NOTE — Patient Outreach (Signed)
Transition of care: Placed call to patient. No answer. Left a message requesting a call back.  PLAN: will continue to outreach patient weekly.  Tomasa Rand, RN, BSN, CEN [facility] [employer] [phone]

## 2017-03-17 ENCOUNTER — Telehealth: Payer: Self-pay | Admitting: Internal Medicine

## 2017-03-17 DIAGNOSIS — M810 Age-related osteoporosis without current pathological fracture: Secondary | ICD-10-CM | POA: Diagnosis not present

## 2017-03-17 DIAGNOSIS — J441 Chronic obstructive pulmonary disease with (acute) exacerbation: Secondary | ICD-10-CM | POA: Diagnosis not present

## 2017-03-17 DIAGNOSIS — J449 Chronic obstructive pulmonary disease, unspecified: Secondary | ICD-10-CM | POA: Diagnosis not present

## 2017-03-17 DIAGNOSIS — G3184 Mild cognitive impairment, so stated: Secondary | ICD-10-CM

## 2017-03-17 DIAGNOSIS — G4733 Obstructive sleep apnea (adult) (pediatric): Secondary | ICD-10-CM | POA: Diagnosis not present

## 2017-03-17 DIAGNOSIS — I5033 Acute on chronic diastolic (congestive) heart failure: Secondary | ICD-10-CM | POA: Diagnosis not present

## 2017-03-17 DIAGNOSIS — Z9981 Dependence on supplemental oxygen: Secondary | ICD-10-CM | POA: Diagnosis not present

## 2017-03-17 DIAGNOSIS — Z7982 Long term (current) use of aspirin: Secondary | ICD-10-CM | POA: Diagnosis not present

## 2017-03-17 DIAGNOSIS — Z87440 Personal history of urinary (tract) infections: Secondary | ICD-10-CM | POA: Diagnosis not present

## 2017-03-17 NOTE — Telephone Encounter (Signed)
States went out to patients home after OV on 8/17.  States patients oxygen tank was in her driveway and cane was at the top of her steps.  States patient was very out of breath and could not tell her why her oxygen was in her driveway.   States that patient is not using inhalers correctly.  States she is either using too much of one or not enough of the other.   Patient use to have help with dispensing meds with sister.  Patient has now alienated herself from her sister.  Patient does not have daily meds filled correctly.  Did get in contact with exact med.  But they are not able to send every med b/c they do not have scripts for every med.  Company is needing:  Aspirin, B complex, aricept, guaifenesin, potassium,  daliresp 500mg , and probiotic.  Company fax number is:  [phone] States patient has fallen twice over the weekend.  States there was no new injuries due to fall.   States has tried to contact granddaughter but has not been successful.  States granddaughter is at [employer].   Believes patient needs 24 hour care.  Has spoken with manger and is liening towards APS reporting due to patient self neglect and alienating herself from her family.

## 2017-03-17 NOTE — Telephone Encounter (Signed)
Shanon Brow from [facility] called to report that the pts BP at rest in the right arm was 86/56 and her heart rate was 112. He thinks that she could have been dehydrated. She is scheduled to see Dr Jenny Reichmann on Thursday.

## 2017-03-17 NOTE — Telephone Encounter (Signed)
BP Readings from Last 3 Encounters:  03/13/17 104/64  03/09/17 (!) 106/52  03/04/17 116/66  This is too low even for her  Needs to go to ED now please

## 2017-03-17 NOTE — Telephone Encounter (Signed)
Called pt, phone just rings. LVM for Shanon Brow at home health.

## 2017-03-18 MED ORDER — PROBIOTIC PO CAPS: ORAL_CAPSULE | ORAL | 1 refills | Status: AC

## 2017-03-18 MED ORDER — POTASSIUM CHLORIDE ER 10 MEQ PO TBCR: 10.0000 meq | EXTENDED_RELEASE_TABLET | Freq: Every day | ORAL | 1 refills | Status: DC

## 2017-03-18 MED ORDER — DONEPEZIL HCL 10 MG PO TABS: 10.0000 mg | ORAL_TABLET | Freq: Every day | ORAL | 1 refills | Status: DC

## 2017-03-18 MED ORDER — B COMPLEX PO TABS: 1.0000 | ORAL_TABLET | Freq: Every day | ORAL | 5 refills | Status: AC

## 2017-03-18 MED ORDER — ASPIRIN EC 81 MG PO TBEC: 81.0000 mg | DELAYED_RELEASE_TABLET | Freq: Every day | ORAL | 1 refills | Status: AC

## 2017-03-18 MED ORDER — GUAIFENESIN ER 600 MG PO TB12: 600.0000 mg | ORAL_TABLET | Freq: Two times a day (BID) | ORAL | 1 refills | Status: AC

## 2017-03-18 MED ORDER — ROFLUMILAST 500 MCG PO TABS: 500.0000 ug | ORAL_TABLET | Freq: Every day | ORAL | 1 refills | Status: AC

## 2017-03-18 NOTE — Telephone Encounter (Signed)
[place] for APS to see pt  OK for shirron to provide written rx for meds listed, thanks

## 2017-03-18 NOTE — Telephone Encounter (Signed)
Called [name] back no answer LMOPM w/MD response. Also fax med to fax # that was given...[name]

## 2017-03-18 NOTE — Progress Notes (Deleted)
Cardiology Office Note   Date:  03/18/2017   ID:  Michele, Martin 27-Jan-1946, MRN [id number]  PCP:  Biagio Borg, MD  Cardiologist:  Dr. Johnsie Cancel    No chief complaint on file.     History of Present Illness: Michele Martin is a 71 y.o. female who presents for ***tachycardia  Former smoker on 2L oxygen sees Tammy Parrett CT shows moderate to severe emphysema. FEF1 1.7 moderate diffusion Defect and some bronchodilator response Hospitalized in May 2018 for sepsis COPD exacerbation and ? Diastolic dysfunction. Was aggressively hydrated for sepsis at time  D/C on Lasix 20 mg daily  Chest pain then and r/o with no acute ECG changes  Negative troponin x 3 thought to be due to COPD and Pneumonia Echo reviewed from 07/27/16 EF 60-65% only grade on diastolic dysfunction moderate LAE Trivial TR and no pulmonary hypertension Seen by Dr Jenny Reichmann 01/13/17 and complained of atypical chest pain CP is mild, intermittent, sharp and dull sometimes, fleeting for seconds, may be pleuritic but not sure, and is nonexertional and nonpositional, without radiation, assoc with n/v, palp or diaphoresis Labs reviewed from 01/13/17 and BNP is normal K 3.3 Cr .72   Study Conclusions  - Left ventricle: The cavity size was normal. Wall thickness was   normal. Systolic function was normal. The estimated ejection   fraction was in the range of 55% to 60%. Wall motion was normal;   there were no regional wall motion abnormalities. Doppler   parameters are consistent with abnormal left ventricular   relaxation (grade 1 diastolic dysfunction). - Pulmonary arteries: Systolic pressure was mildly increased. PA   peak pressure: 38 mm Hg (S).   Past Medical History:  Diagnosis Date  . Acute angle-closure glaucoma 02/14/2010  . ALLERGIC RHINITIS 09/19/2009  . ANXIETY 04/15/2010  . Arthritis   . ASTHMA 09/19/2009  . BACK PAIN 02/14/2010  . Chronic bronchitis 04/14/2011  . CHRONIC OBSTRUCTIVE PULMONARY DISEASE,  ACUTE EXACERBATION 12/19/2009  . Complication of anesthesia   . COPD 06/27/2009  . DEPRESSION 09/19/2009  . HOARSENESS 10/17/2009  . HYPERLIPIDEMIA 09/19/2009  . MENOPAUSAL DISORDER 08/20/2010  . OSTEOPENIA 09/10/2010  . Osteoporosis, unspecified 04/20/2014  . Rotator cuff tear 06/23/2011  . TINNITUS 09/10/2010  . TREMOR 02/14/2010  . Tremor 07/30/2011  . URI 09/10/2010  . Wheezing 06/27/2009    Past Surgical History:  Procedure Laterality Date  . ABDOMINAL HYSTERECTOMY    . cervical fusion 2013 - Dr Vertell Limber    . Normal Heart Cath  2001   Dr. Johnsie Cancel  . s/p laser tx for glaucoma     no vision loss     Current Outpatient Prescriptions  Medication Sig Dispense Refill  . albuterol (PROVENTIL HFA) 108 (90 Base) MCG/ACT inhaler Inhale 2 puffs into the lungs every 6 (six) hours as needed for wheezing or shortness of breath. 18 g 5  . albuterol (PROVENTIL) (2.5 MG/3ML) 0.083% nebulizer solution Take 3 mLs (2.5 mg total) by nebulization every 6 (six) hours as needed for wheezing or shortness of breath. 150 mL 1  . aspirin EC 81 MG tablet Take 1 tablet (81 mg total) by mouth at bedtime. 90 tablet 1  . atorvastatin (LIPITOR) 20 MG tablet Take 1 tablet (20 mg total) by mouth at bedtime. 90 tablet 1  . b complex vitamins tablet Take 1 tablet by mouth at bedtime. 30 tablet 5  . clonazePAM (KLONOPIN) 0.5 MG tablet Take 1 tablet (0.5 mg total) by mouth  2 (two) times daily as needed for anxiety. 60 tablet 5  . donepezil (ARICEPT) 10 MG tablet Take 1 tablet (10 mg total) by mouth at bedtime. 90 tablet 1  . escitalopram (LEXAPRO) 20 MG tablet Take 0.5 tablets (10 mg total) by mouth daily. 45 tablet 3  . fluticasone furoate-vilanterol (BREO ELLIPTA) 200-25 MCG/INH AEPB Inhale 1 puff into the lungs daily. 60 each 6  . furosemide (LASIX) 40 MG tablet Take 1 tablet (40 mg total) by mouth daily. 90 tablet 3  . gabapentin (NEURONTIN) 100 MG capsule TAKE 1 CAPSULE BY MOUTH THREE TIMES DAILY 90 capsule 11  .  guaiFENesin (MUCINEX) 600 MG 12 hr tablet Take 1 tablet (600 mg total) by mouth 2 (two) times daily. 180 tablet 1  . hydrochlorothiazide (HYDRODIURIL) 25 MG tablet Take 25 mg by mouth daily.    . montelukast (SINGULAIR) 10 MG tablet TAKE 1 TABLET BY MOUTH EVERY NIGHT AT BEDTIME 30 tablet 11  . potassium chloride (K-DUR) 10 MEQ tablet Take 1 tablet (10 mEq total) by mouth daily. 90 tablet 1  . predniSONE (DELTASONE) 10 MG tablet 4 tabs daily x2 days, 3 tabs x 2 days, 2 tabs x 2 days, 1 tabs x 2 days then stop 20 tablet 0  . Probiotic CAPS Take 1 capsule by mouth every day 90 capsule 1  . roflumilast (DALIRESP) 500 MCG TABS tablet Take 1 tablet (500 mcg total) by mouth daily. 90 tablet 1  . traMADol (ULTRAM) 50 MG tablet Take 1 tablet (50 mg total) by mouth every 8 (eight) hours as needed. 60 tablet 1  . umeclidinium bromide (INCRUSE ELLIPTA) 62.5 MCG/INH AEPB Inhale 1 puff into the lungs daily. 30 each 6   No current facility-administered medications for this visit.     Allergies:   Pravastatin; Simvastatin; and Statins    Social History:  The patient  reports that she has quit smoking. Her smoking use included Cigarettes. She started smoking about 20 months ago. She has a 25.00 pack-year smoking history. She has never used smokeless tobacco. She reports that she does not drink alcohol or use drugs.   Family History:  The patient's ***family history includes Dementia in her mother; Rheum arthritis in her maternal uncle.    ROS:  General:no colds or fevers, no weight changes Skin:no rashes or ulcers HEENT:no blurred vision, no congestion CV:see HPI PUL:see HPI GI:no diarrhea constipation or melena, no indigestion GU:no hematuria, no dysuria MS:no joint pain, no claudication Neuro:no syncope, no lightheadedness Endo:no diabetes, no thyroid disease Wt Readings from Last 3 Encounters:  03/13/17 100 lb (45.4 kg)  03/09/17 100 lb 12.8 oz (45.7 kg)  03/04/17 102 lb (46.3 kg)     PHYSICAL  EXAM: VS:  There were no vitals taken for this visit. , BMI There is no height or weight on file to calculate BMI. General:Pleasant affect, NAD Skin:Warm and dry, brisk capillary refill HEENT:normocephalic, sclera clear, mucus membranes moist Neck:supple, no JVD, no bruits  Heart:S1S2 RRR without murmur, gallup, rub or click Lungs:clear without rales, rhonchi, or wheezes JSH:FWYO, non tender, + BS, do not palpate liver spleen or masses Ext:no lower ext edema, 2+ pedal pulses, 2+ radial pulses Neuro:alert and oriented, MAE, follows commands, + facial symmetry    EKG:  EKG is ordered today. The ekg ordered today demonstrates ***   Recent Labs: 08/04/2016: B Natriuretic Peptide 52.9 10/28/2016: TSH 0.55 01/13/2017: Pro B Natriuretic peptide (BNP) 42.0 02/22/2017: Magnesium 2.2 02/23/2017: ALT 18 02/25/2017: BUN 20;  Creatinine, Ser 0.42; Hemoglobin 10.2; Platelets 228; Potassium 3.4; Sodium 140    Lipid Panel    Component Value Date/Time   CHOL 222 (H) 08/09/2015 1504   TRIG 95.0 08/09/2015 1504   HDL 89.50 08/09/2015 1504   CHOLHDL 2 08/09/2015 1504   VLDL 19.0 08/09/2015 1504   LDLCALC 114 (H) 08/09/2015 1504   LDLDIRECT 164.4 07/19/2013 1110       Other studies Reviewed: Additional studies/ records that were reviewed today include: ***.   ASSESSMENT AND PLAN:  1.  ***   Current medicines are reviewed with the patient today.  The patient Has no concerns regarding medicines.  The following changes have been made:  See above Labs/ tests ordered today include:see above  Disposition:   FU:  see above  Signed, Cecilie Kicks, NP  03/18/2017 9:45 PM    [facility] [place], [address] [place], [place] Phone: [phone]; Fax: [phone]

## 2017-03-19 ENCOUNTER — Ambulatory Visit: Payer: Medicare Other | Admitting: Internal Medicine

## 2017-03-19 ENCOUNTER — Ambulatory Visit: Payer: Medicare Other | Admitting: Cardiology

## 2017-03-20 ENCOUNTER — Emergency Department (HOSPITAL_COMMUNITY): Payer: Medicare Other

## 2017-03-20 ENCOUNTER — Inpatient Hospital Stay (HOSPITAL_COMMUNITY)
Admission: EM | Admit: 2017-03-20 | Discharge: 2017-03-26 | DRG: 190 | Disposition: A | Discharge status: Home / Self Care | Payer: Medicare Other | Attending: Internal Medicine | Admitting: Internal Medicine

## 2017-03-20 ENCOUNTER — Encounter (HOSPITAL_COMMUNITY): Payer: Self-pay

## 2017-03-20 DIAGNOSIS — G9341 Metabolic encephalopathy: Secondary | ICD-10-CM | POA: Diagnosis present

## 2017-03-20 DIAGNOSIS — Z981 Arthrodesis status: Secondary | ICD-10-CM

## 2017-03-20 DIAGNOSIS — F329 Major depressive disorder, single episode, unspecified: Secondary | ICD-10-CM | POA: Diagnosis not present

## 2017-03-20 DIAGNOSIS — F419 Anxiety disorder, unspecified: Secondary | ICD-10-CM | POA: Diagnosis not present

## 2017-03-20 DIAGNOSIS — E871 Hypo-osmolality and hyponatremia: Secondary | ICD-10-CM | POA: Diagnosis present

## 2017-03-20 DIAGNOSIS — G9340 Encephalopathy, unspecified: Secondary | ICD-10-CM

## 2017-03-20 DIAGNOSIS — A419 Sepsis, unspecified organism: Secondary | ICD-10-CM | POA: Diagnosis not present

## 2017-03-20 DIAGNOSIS — E785 Hyperlipidemia, unspecified: Secondary | ICD-10-CM | POA: Diagnosis not present

## 2017-03-20 DIAGNOSIS — R531 Weakness: Secondary | ICD-10-CM | POA: Diagnosis not present

## 2017-03-20 DIAGNOSIS — E873 Alkalosis: Secondary | ICD-10-CM | POA: Diagnosis present

## 2017-03-20 DIAGNOSIS — Z7982 Long term (current) use of aspirin: Secondary | ICD-10-CM

## 2017-03-20 DIAGNOSIS — Z9981 Dependence on supplemental oxygen: Secondary | ICD-10-CM | POA: Diagnosis not present

## 2017-03-20 DIAGNOSIS — R000 Tachycardia, unspecified: Secondary | ICD-10-CM | POA: Diagnosis not present

## 2017-03-20 DIAGNOSIS — E538 Deficiency of other specified B group vitamins: Secondary | ICD-10-CM | POA: Diagnosis present

## 2017-03-20 DIAGNOSIS — Z87891 Personal history of nicotine dependence: Secondary | ICD-10-CM

## 2017-03-20 DIAGNOSIS — G8929 Other chronic pain: Secondary | ICD-10-CM | POA: Diagnosis present

## 2017-03-20 DIAGNOSIS — Z90710 Acquired absence of both cervix and uterus: Secondary | ICD-10-CM

## 2017-03-20 DIAGNOSIS — F0390 Unspecified dementia without behavioral disturbance: Secondary | ICD-10-CM | POA: Diagnosis present

## 2017-03-20 DIAGNOSIS — F418 Other specified anxiety disorders: Secondary | ICD-10-CM | POA: Diagnosis present

## 2017-03-20 DIAGNOSIS — R6510 Systemic inflammatory response syndrome (SIRS) of non-infectious origin without acute organ dysfunction: Secondary | ICD-10-CM | POA: Diagnosis present

## 2017-03-20 DIAGNOSIS — I5033 Acute on chronic diastolic (congestive) heart failure: Secondary | ICD-10-CM | POA: Diagnosis not present

## 2017-03-20 DIAGNOSIS — E878 Other disorders of electrolyte and fluid balance, not elsewhere classified: Secondary | ICD-10-CM | POA: Diagnosis not present

## 2017-03-20 DIAGNOSIS — Z888 Allergy status to other drugs, medicaments and biological substances status: Secondary | ICD-10-CM

## 2017-03-20 DIAGNOSIS — J449 Chronic obstructive pulmonary disease, unspecified: Secondary | ICD-10-CM | POA: Diagnosis not present

## 2017-03-20 DIAGNOSIS — Z7951 Long term (current) use of inhaled steroids: Secondary | ICD-10-CM | POA: Diagnosis not present

## 2017-03-20 DIAGNOSIS — R0902 Hypoxemia: Secondary | ICD-10-CM | POA: Diagnosis not present

## 2017-03-20 DIAGNOSIS — M810 Age-related osteoporosis without current pathological fracture: Secondary | ICD-10-CM | POA: Diagnosis not present

## 2017-03-20 DIAGNOSIS — I5032 Chronic diastolic (congestive) heart failure: Secondary | ICD-10-CM | POA: Diagnosis present

## 2017-03-20 DIAGNOSIS — J9622 Acute and chronic respiratory failure with hypercapnia: Secondary | ICD-10-CM | POA: Diagnosis not present

## 2017-03-20 DIAGNOSIS — E874 Mixed disorder of acid-base balance: Secondary | ICD-10-CM | POA: Diagnosis present

## 2017-03-20 DIAGNOSIS — G3184 Mild cognitive impairment, so stated: Secondary | ICD-10-CM | POA: Diagnosis not present

## 2017-03-20 DIAGNOSIS — Z79899 Other long term (current) drug therapy: Secondary | ICD-10-CM | POA: Diagnosis not present

## 2017-03-20 DIAGNOSIS — E440 Moderate protein-calorie malnutrition: Secondary | ICD-10-CM | POA: Diagnosis present

## 2017-03-20 DIAGNOSIS — R0600 Dyspnea, unspecified: Secondary | ICD-10-CM

## 2017-03-20 DIAGNOSIS — J441 Chronic obstructive pulmonary disease with (acute) exacerbation: Secondary | ICD-10-CM | POA: Diagnosis not present

## 2017-03-20 DIAGNOSIS — Z87440 Personal history of urinary (tract) infections: Secondary | ICD-10-CM | POA: Diagnosis not present

## 2017-03-20 DIAGNOSIS — G4733 Obstructive sleep apnea (adult) (pediatric): Secondary | ICD-10-CM | POA: Diagnosis not present

## 2017-03-20 DIAGNOSIS — E876 Hypokalemia: Secondary | ICD-10-CM | POA: Diagnosis present

## 2017-03-20 DIAGNOSIS — J9621 Acute and chronic respiratory failure with hypoxia: Secondary | ICD-10-CM | POA: Diagnosis present

## 2017-03-20 DIAGNOSIS — R9431 Abnormal electrocardiogram [ECG] [EKG]: Secondary | ICD-10-CM | POA: Diagnosis not present

## 2017-03-20 DIAGNOSIS — R0602 Shortness of breath: Secondary | ICD-10-CM | POA: Diagnosis not present

## 2017-03-20 LAB — COMPREHENSIVE METABOLIC PANEL
ALBUMIN: 3 g/dL — AB (ref 3.5–5.0)
ALK PHOS: 64 U/L (ref 38–126)
ALT: 12 U/L — AB (ref 14–54)
AST: 70 U/L — AB (ref 15–41)
BILIRUBIN TOTAL: 2.7 mg/dL — AB (ref 0.3–1.2)
BUN: 16 mg/dL (ref 6–20)
CALCIUM: 8.7 mg/dL — AB (ref 8.9–10.3)
CO2: 50 mmol/L — ABNORMAL HIGH (ref 22–32)
CREATININE: 1 mg/dL (ref 0.44–1.00)
Chloride: <65 mmol/L — CL (ref 101–111)
GFR calc Af Amer: >60 mL/min (ref >60)
GFR, EST NON AFRICAN AMERICAN: 55 mL/min — AB (ref >60)
GLUCOSE: 159 mg/dL — AB (ref 65–99)
Potassium: 3.5 mmol/L (ref 3.5–5.1)
Sodium: 127 mmol/L — ABNORMAL LOW (ref 135–145)
TOTAL PROTEIN: 6.4 g/dL — AB (ref 6.5–8.1)

## 2017-03-20 LAB — CBC WITH DIFFERENTIAL/PLATELET
BASOS ABS: 0 10*3/uL (ref 0.0–0.1)
BASOS PCT: 0 %
Eosinophils Absolute: 0.1 10*3/uL (ref 0.0–0.7)
Eosinophils Relative: 1 %
HEMATOCRIT: 37.8 % (ref 36.0–46.0)
HEMOGLOBIN: 13.7 g/dL (ref 12.0–15.0)
Lymphocytes Relative: 7 %
Lymphs Abs: 0.8 10*3/uL (ref 0.7–4.0)
MCH: 33.6 pg (ref 26.0–34.0)
MCHC: 36.2 g/dL — ABNORMAL HIGH (ref 30.0–36.0)
MCV: 92.6 fL (ref 78.0–100.0)
MONO ABS: 0.4 10*3/uL (ref 0.1–1.0)
Monocytes Relative: 3 %
NEUTROS ABS: 9.6 10*3/uL — AB (ref 1.7–7.7)
NEUTROS PCT: 89 %
Platelets: 271 10*3/uL (ref 150–400)
RBC: 4.08 MIL/uL (ref 3.87–5.11)
RDW: 12.5 % (ref 11.5–15.5)
WBC: 10.8 10*3/uL — ABNORMAL HIGH (ref 4.0–10.5)

## 2017-03-20 LAB — URINALYSIS, ROUTINE W REFLEX MICROSCOPIC
BILIRUBIN URINE: NEGATIVE
Glucose, UA: NEGATIVE mg/dL
Hgb urine dipstick: NEGATIVE
KETONES UR: NEGATIVE mg/dL
LEUKOCYTES UA: NEGATIVE
NITRITE: NEGATIVE
PROTEIN: NEGATIVE mg/dL
Specific Gravity, Urine: 1.025 (ref 1.005–1.030)
pH: 6 (ref 5.0–8.0)

## 2017-03-20 LAB — BLOOD GAS, ARTERIAL
Acid-Base Excess: 24.4 mmol/L — ABNORMAL HIGH (ref 0.0–2.0)
Bicarbonate: 52.6 mmol/L — ABNORMAL HIGH (ref 20.0–28.0)
DRAWN BY: 441261
O2 Content: 8 L/min
O2 Saturation: 99.4 %
PH ART: 7.533 — AB (ref 7.350–7.450)
Patient temperature: 98.6
pCO2 arterial: 62.8 mmHg — ABNORMAL HIGH (ref 32.0–48.0)
pO2, Arterial: 192 mmHg — ABNORMAL HIGH (ref 83.0–108.0)

## 2017-03-20 LAB — BASIC METABOLIC PANEL
Anion gap: 14 (ref 5–15)
BUN: 17 mg/dL (ref 6–20)
CALCIUM: 8 mg/dL — AB (ref 8.9–10.3)
CO2: 40 mmol/L — ABNORMAL HIGH (ref 22–32)
CREATININE: 0.96 mg/dL (ref 0.44–1.00)
Chloride: 75 mmol/L — ABNORMAL LOW (ref 101–111)
GFR calc non Af Amer: 58 mL/min — ABNORMAL LOW (ref >60)
Glucose, Bld: 294 mg/dL — ABNORMAL HIGH (ref 65–99)
Potassium: <2 mmol/L — CL (ref 3.5–5.1)
SODIUM: 129 mmol/L — AB (ref 135–145)

## 2017-03-20 LAB — PROCALCITONIN: PROCALCITONIN: 0.13 ng/mL

## 2017-03-20 LAB — TROPONIN I: Troponin I: 0.03 ng/mL (ref <0.03)

## 2017-03-20 LAB — PROTIME-INR
INR: 0.96
PROTHROMBIN TIME: 12.7 s (ref 11.4–15.2)

## 2017-03-20 LAB — I-STAT TROPONIN, ED: Troponin i, poc: 0.01 ng/mL (ref 0.00–0.08)

## 2017-03-20 LAB — MAGNESIUM: MAGNESIUM: 1.3 mg/dL — AB (ref 1.7–2.4)

## 2017-03-20 LAB — I-STAT CG4 LACTIC ACID, ED: LACTIC ACID, VENOUS: 4.63 mmol/L — AB (ref 0.5–1.9)

## 2017-03-20 LAB — LACTIC ACID, PLASMA
LACTIC ACID, VENOUS: 6.9 mmol/L — AB (ref 0.5–1.9)
Lactic Acid, Venous: 5.9 mmol/L (ref 0.5–1.9)

## 2017-03-20 LAB — POTASSIUM: Potassium: <2 mmol/L — CL (ref 3.5–5.1)

## 2017-03-20 LAB — MRSA PCR SCREENING: MRSA BY PCR: NEGATIVE

## 2017-03-20 LAB — BRAIN NATRIURETIC PEPTIDE: B Natriuretic Peptide: 71.6 pg/mL (ref 0.0–100.0)

## 2017-03-20 MED ORDER — POTASSIUM CHLORIDE 10 MEQ/100ML IV SOLN
10.0000 meq | INTRAVENOUS | Status: AC
  Administered 2017-03-20 – 2017-03-21 (×4): 10 meq via INTRAVENOUS
  Filled 2017-03-20 (×2): qty 100

## 2017-03-20 MED ORDER — SODIUM CHLORIDE 0.9 % IV SOLN
INTRAVENOUS | Status: DC
  Administered 2017-03-20: 22:00:00 via INTRAVENOUS
  Administered 2017-03-22: 125 mL/h via INTRAVENOUS

## 2017-03-20 MED ORDER — POTASSIUM CHLORIDE 20 MEQ/15ML (10%) PO SOLN
40.0000 meq | Freq: Once | ORAL | Status: AC
  Administered 2017-03-20: 40 meq via ORAL
  Filled 2017-03-20: qty 30

## 2017-03-20 MED ORDER — ATORVASTATIN CALCIUM 10 MG PO TABS
20.0000 mg | ORAL_TABLET | Freq: Every day | ORAL | Status: DC
Start: 2017-03-20 — End: 2017-03-26
  Administered 2017-03-20 – 2017-03-25 (×6): 20 mg via ORAL
  Filled 2017-03-20 (×2): qty 2
  Filled 2017-03-20: qty 1
  Filled 2017-03-20 (×2): qty 2
  Filled 2017-03-20: qty 1

## 2017-03-20 MED ORDER — SODIUM CHLORIDE 0.9 % IV BOLUS (SEPSIS)
1000.0000 mL | Freq: Once | INTRAVENOUS | Status: AC
  Administered 2017-03-20: 1000 mL via INTRAVENOUS

## 2017-03-20 MED ORDER — GABAPENTIN 100 MG PO CAPS
100.0000 mg | ORAL_CAPSULE | Freq: Three times a day (TID) | ORAL | Status: DC
  Administered 2017-03-20 – 2017-03-26 (×17): 100 mg via ORAL
  Filled 2017-03-20 (×17): qty 1

## 2017-03-20 MED ORDER — VANCOMYCIN HCL IN DEXTROSE 1-5 GM/200ML-% IV SOLN: 1000.0000 mg | Freq: Once | INTRAVENOUS | Status: DC

## 2017-03-20 MED ORDER — DEXTROSE 5 % IV SOLN
500.0000 mg | Freq: Every day | INTRAVENOUS | Status: DC
  Administered 2017-03-20: 500 mg via INTRAVENOUS
  Filled 2017-03-20: qty 500

## 2017-03-20 MED ORDER — LEVALBUTEROL HCL 1.25 MG/0.5ML IN NEBU
1.2500 mg | INHALATION_SOLUTION | Freq: Four times a day (QID) | RESPIRATORY_TRACT | Status: DC
  Administered 2017-03-20: 1.25 mg via RESPIRATORY_TRACT
  Filled 2017-03-20: qty 0.5

## 2017-03-20 MED ORDER — SODIUM CHLORIDE 0.9 % IV BOLUS (SEPSIS)
500.0000 mL | Freq: Once | INTRAVENOUS | Status: AC
  Administered 2017-03-20: 500 mL via INTRAVENOUS

## 2017-03-20 MED ORDER — IPRATROPIUM BROMIDE 0.02 % IN SOLN
0.5000 mg | Freq: Once | RESPIRATORY_TRACT | Status: AC
  Administered 2017-03-20: 0.5 mg via RESPIRATORY_TRACT
  Filled 2017-03-20: qty 2.5

## 2017-03-20 MED ORDER — TRAMADOL HCL 50 MG PO TABS: 50.0000 mg | ORAL_TABLET | Freq: Three times a day (TID) | ORAL | Status: DC | PRN

## 2017-03-20 MED ORDER — IPRATROPIUM BROMIDE 0.02 % IN SOLN
0.5000 mg | Freq: Four times a day (QID) | RESPIRATORY_TRACT | Status: DC
  Administered 2017-03-21 – 2017-03-25 (×17): 0.5 mg via RESPIRATORY_TRACT
  Filled 2017-03-20 (×19): qty 2.5

## 2017-03-20 MED ORDER — ENOXAPARIN SODIUM 30 MG/0.3ML ~~LOC~~ SOLN
30.0000 mg | SUBCUTANEOUS | Status: DC
  Administered 2017-03-20 – 2017-03-21 (×2): 30 mg via SUBCUTANEOUS
  Filled 2017-03-20 (×2): qty 0.3

## 2017-03-20 MED ORDER — LEVALBUTEROL HCL 1.25 MG/0.5ML IN NEBU
1.2500 mg | INHALATION_SOLUTION | Freq: Four times a day (QID) | RESPIRATORY_TRACT | Status: DC
  Administered 2017-03-21 – 2017-03-25 (×17): 1.25 mg via RESPIRATORY_TRACT
  Filled 2017-03-20 (×19): qty 0.5

## 2017-03-20 MED ORDER — PIPERACILLIN-TAZOBACTAM 3.375 G IVPB 30 MIN
3.3750 g | Freq: Once | INTRAVENOUS | Status: AC
  Administered 2017-03-20: 3.375 g via INTRAVENOUS
  Filled 2017-03-20: qty 50

## 2017-03-20 MED ORDER — GUAIFENESIN ER 600 MG PO TB12
600.0000 mg | ORAL_TABLET | Freq: Two times a day (BID) | ORAL | Status: DC
  Administered 2017-03-20 – 2017-03-26 (×12): 600 mg via ORAL
  Filled 2017-03-20 (×12): qty 1

## 2017-03-20 MED ORDER — ESCITALOPRAM OXALATE 20 MG PO TABS
10.0000 mg | ORAL_TABLET | Freq: Every day | ORAL | Status: DC
  Administered 2017-03-21 – 2017-03-26 (×6): 10 mg via ORAL
  Filled 2017-03-20 (×6): qty 1

## 2017-03-20 MED ORDER — RISAQUAD PO CAPS
1.0000 | ORAL_CAPSULE | Freq: Every day | ORAL | Status: DC
  Administered 2017-03-20 – 2017-03-26 (×7): 1 via ORAL
  Filled 2017-03-20 (×7): qty 1

## 2017-03-20 MED ORDER — ACETAMINOPHEN 650 MG RE SUPP: 650.0000 mg | Freq: Four times a day (QID) | RECTAL | Status: DC | PRN

## 2017-03-20 MED ORDER — METHYLPREDNISOLONE SODIUM SUCC 125 MG IJ SOLR
60.0000 mg | Freq: Three times a day (TID) | INTRAMUSCULAR | Status: DC
  Administered 2017-03-20 – 2017-03-24 (×11): 60 mg via INTRAVENOUS
  Filled 2017-03-20 (×11): qty 2

## 2017-03-20 MED ORDER — CLONAZEPAM 0.5 MG PO TABS
0.2500 mg | ORAL_TABLET | Freq: Two times a day (BID) | ORAL | Status: DC | PRN
  Administered 2017-03-22 – 2017-03-26 (×3): 0.25 mg via ORAL
  Filled 2017-03-20 (×5): qty 1

## 2017-03-20 MED ORDER — ALBUTEROL (5 MG/ML) CONTINUOUS INHALATION SOLN
10.0000 mg/h | INHALATION_SOLUTION | Freq: Once | RESPIRATORY_TRACT | Status: AC
  Administered 2017-03-20: 10 mg/h via RESPIRATORY_TRACT
  Filled 2017-03-20: qty 20

## 2017-03-20 MED ORDER — POTASSIUM CHLORIDE 20 MEQ/15ML (10%) PO SOLN: 40.0000 meq | Freq: Two times a day (BID) | ORAL | Status: DC

## 2017-03-20 MED ORDER — ROFLUMILAST 500 MCG PO TABS
500.0000 ug | ORAL_TABLET | Freq: Every day | ORAL | Status: DC
  Administered 2017-03-21 – 2017-03-26 (×6): 500 ug via ORAL
  Filled 2017-03-20 (×6): qty 1

## 2017-03-20 MED ORDER — MONTELUKAST SODIUM 10 MG PO TABS
10.0000 mg | ORAL_TABLET | Freq: Every day | ORAL | Status: DC
  Administered 2017-03-20 – 2017-03-25 (×6): 10 mg via ORAL
  Filled 2017-03-20 (×6): qty 1

## 2017-03-20 MED ORDER — VANCOMYCIN HCL IN DEXTROSE 750-5 MG/150ML-% IV SOLN
750.0000 mg | Freq: Once | INTRAVENOUS | Status: AC
  Administered 2017-03-20: 750 mg via INTRAVENOUS
  Filled 2017-03-20: qty 150

## 2017-03-20 MED ORDER — B COMPLEX-C PO TABS
1.0000 | ORAL_TABLET | Freq: Every day | ORAL | Status: DC
  Administered 2017-03-20 – 2017-03-24 (×5): 1 via ORAL
  Filled 2017-03-20 (×6): qty 1

## 2017-03-20 MED ORDER — IPRATROPIUM BROMIDE 0.02 % IN SOLN
0.5000 mg | RESPIRATORY_TRACT | Status: DC
  Administered 2017-03-20: 0.5 mg via RESPIRATORY_TRACT
  Filled 2017-03-20: qty 2.5

## 2017-03-20 MED ORDER — ONDANSETRON HCL 4 MG/2ML IJ SOLN: 4.0000 mg | Freq: Three times a day (TID) | INTRAMUSCULAR | Status: DC | PRN

## 2017-03-20 MED ORDER — DONEPEZIL HCL 10 MG PO TABS
10.0000 mg | ORAL_TABLET | Freq: Every day | ORAL | Status: DC
  Administered 2017-03-20 – 2017-03-25 (×6): 10 mg via ORAL
  Filled 2017-03-20 (×2): qty 1
  Filled 2017-03-20: qty 2
  Filled 2017-03-20 (×3): qty 1

## 2017-03-20 MED ORDER — SODIUM CHLORIDE 0.9 % IV BOLUS (SEPSIS): 1000.0000 mL | Freq: Once | INTRAVENOUS | Status: DC

## 2017-03-20 MED ORDER — ASPIRIN EC 81 MG PO TBEC
81.0000 mg | DELAYED_RELEASE_TABLET | Freq: Every day | ORAL | Status: DC
  Administered 2017-03-20 – 2017-03-25 (×6): 81 mg via ORAL
  Filled 2017-03-20 (×6): qty 1

## 2017-03-20 MED ORDER — UMECLIDINIUM BROMIDE 62.5 MCG/INH IN AEPB
1.0000 | INHALATION_SPRAY | Freq: Every day | RESPIRATORY_TRACT | Status: DC
  Administered 2017-03-21 – 2017-03-25 (×5): 1 via RESPIRATORY_TRACT
  Filled 2017-03-20 (×2): qty 7

## 2017-03-20 NOTE — ED Notes (Signed)
Bed: RX45 Expected date:  Expected time:  Means of arrival:  Comments: 71 yo f sob, weakness

## 2017-03-20 NOTE — Telephone Encounter (Signed)
noted 

## 2017-03-20 NOTE — Telephone Encounter (Signed)
Michele Martin called stating she is having BP is low 86/52 and has been vomitting.  Pt is going to the [facility]

## 2017-03-20 NOTE — ED Notes (Signed)
Attempted to call report, nurse not ready. Will call back.

## 2017-03-20 NOTE — H&P (Addendum)
History and Physical    Michele Martin RXV:[id number] DOB: [birth year] DOA: 03/20/2017  Referring MD/NP/PA:   PCP: Biagio Borg, MD   Patient coming from:  The patient is coming from home.  At baseline, pt is partially dependent for most of ADL.   Chief Complaint: SOB, AMS  HPI: Michele Martin is a 70 y.o. female with medical history significant of mild cognitive impairment , hyperlipidemia, COPD, asthma, depression, anxiety, tremor, dCHF, who presents with shortness of breath.  Patient has cognitive impairment and AMS, and is unable to provide accurate medical history, therefore, most of the history is obtained by discussing the case with ED physician, per EMS report, and with the nursing staff.  Per EMP report, pt has generalized weakness and shortness of breath for one week. Home health nurse reported that her BP has been trending downward x 1 week as well. She had one SBP of 85, which improved to 96/76 after 200 ml of NS at arrival to ED. When I saw pt in ED, she is confused, not oriented 3. She does not follow commands and dose not want to stay in bed. She has obvious respiratory distress, and cannot speak in full sentences. She has mild cough. No active loss, vomiting, diarrhea. She moves all extremities. No facial droop or slurred speech.  ED Course: pt was found to have ABG with pH 7.533, PCO2 62.8 and PO2 192, oxygen saturation 85% on room air, WBC 10.8, lactic acid 4.63, negative urinalysis, sodium 127, bicarbonate 50, chloride<65, creatinine 1.0, tachycardia, tachypnea, normal temperature, currently blood pressure 1:30/69. CT head is negative for acute intracranial abnormalities, but showed stable right frontal parafalcine meningioma. Pt is admitted to SDU as inpt  Review of Systems: Could not be reviewed accurately due to altered mental status and cognitive impairment.  Allergy:  Allergies  Allergen Reactions  . Pravastatin Itching and Other (See Comments)   Reaction:  Muscle cramps   . Simvastatin Itching and Other (See Comments)    Reaction:  Muscle cramps   . Statins Itching and Other (See Comments)    Reaction:  Muscle cramps    Past Medical History:  Diagnosis Date  . Acute angle-closure glaucoma 02/14/2010  . ALLERGIC RHINITIS 09/19/2009  . ANXIETY 04/15/2010  . Arthritis   . ASTHMA 09/19/2009  . BACK PAIN 02/14/2010  . Chronic bronchitis 04/14/2011  . CHRONIC OBSTRUCTIVE PULMONARY DISEASE, ACUTE EXACERBATION 12/19/2009  . Complication of anesthesia   . COPD 06/27/2009  . DEPRESSION 09/19/2009  . HOARSENESS 10/17/2009  . HYPERLIPIDEMIA 09/19/2009  . MENOPAUSAL DISORDER 08/20/2010  . OSTEOPENIA 09/10/2010  . Osteoporosis, unspecified 04/20/2014  . Rotator cuff tear 06/23/2011  . TINNITUS 09/10/2010  . TREMOR 02/14/2010  . Tremor 07/30/2011  . URI 09/10/2010  . Wheezing 06/27/2009    Past Surgical History:  Procedure Laterality Date  . ABDOMINAL HYSTERECTOMY    . cervical fusion 2013 - Dr Vertell Limber    . Normal Heart Cath  2001   Dr. Johnsie Cancel  . s/p laser tx for glaucoma     no vision loss    Social History:  reports that she has quit smoking. Her smoking use included Cigarettes. She started smoking about 20 months ago. She has a 25.00 pack-year smoking history. She has never used smokeless tobacco. She reports that she does not drink alcohol or use drugs.  Family History:  Family History  Problem Relation Age of Onset  . Dementia Mother   . Rheum arthritis Maternal Uncle   .  Colon cancer Neg Hx      Prior to Admission medications   Medication Sig Start Date End Date Taking? Authorizing Provider  albuterol (PROVENTIL HFA) 108 (90 Base) MCG/ACT inhaler Inhale 2 puffs into the lungs every 6 (six) hours as needed for wheezing or shortness of breath. 02/06/17  Yes Biagio Borg, MD  albuterol (PROVENTIL) (2.5 MG/3ML) 0.083% nebulizer solution Take 3 mLs (2.5 mg total) by nebulization every 6 (six) hours as needed for wheezing or shortness of  breath. 02/10/17  Yes Biagio Borg, MD  aspirin EC 81 MG tablet Take 1 tablet (81 mg total) by mouth at bedtime. 03/18/17  Yes Biagio Borg, MD  atorvastatin (LIPITOR) 20 MG tablet Take 1 tablet (20 mg total) by mouth at bedtime. 02/06/17  Yes Biagio Borg, MD  b complex vitamins tablet Take 1 tablet by mouth at bedtime. 03/18/17  Yes Biagio Borg, MD  clonazePAM (KLONOPIN) 0.5 MG tablet Take 1 tablet (0.5 mg total) by mouth 2 (two) times daily as needed for anxiety. 08/20/16  Yes Biagio Borg, MD  donepezil (ARICEPT) 10 MG tablet Take 1 tablet (10 mg total) by mouth at bedtime. 03/18/17  Yes Biagio Borg, MD  escitalopram (LEXAPRO) 20 MG tablet Take 0.5 tablets (10 mg total) by mouth daily. 08/20/16  Yes Biagio Borg, MD  fluticasone furoate-vilanterol (BREO ELLIPTA) 200-25 MCG/INH AEPB Inhale 1 puff into the lungs daily. 03/03/17  Yes Mannam, Praveen, MD  furosemide (LASIX) 40 MG tablet Take 1 tablet (40 mg total) by mouth daily. 01/13/17  Yes Biagio Borg, MD  gabapentin (NEURONTIN) 100 MG capsule TAKE 1 CAPSULE BY MOUTH THREE TIMES DAILY 01/16/17  Yes Biagio Borg, MD  guaiFENesin (MUCINEX) 600 MG 12 hr tablet Take 1 tablet (600 mg total) by mouth 2 (two) times daily. 03/18/17  Yes Biagio Borg, MD  hydrochlorothiazide (HYDRODIURIL) 25 MG tablet Take 25 mg by mouth daily.   Yes [provider]  montelukast (SINGULAIR) 10 MG tablet TAKE 1 TABLET BY MOUTH EVERY NIGHT AT BEDTIME 03/26/16  Yes Biagio Borg, MD  potassium chloride (K-DUR) 10 MEQ tablet Take 1 tablet (10 mEq total) by mouth daily. 03/18/17  Yes Biagio Borg, MD  Probiotic CAPS Take 1 capsule by mouth every day 03/18/17  Yes Biagio Borg, MD  roflumilast (DALIRESP) 500 MCG TABS tablet Take 1 tablet (500 mcg total) by mouth daily. 03/18/17  Yes Biagio Borg, MD  traMADol (ULTRAM) 50 MG tablet Take 1 tablet (50 mg total) by mouth every 8 (eight) hours as needed. Patient taking differently: Take 50 mg by mouth every 8 (eight) hours  as needed for moderate pain or severe pain.  03/13/17  Yes Biagio Borg, MD  umeclidinium bromide (INCRUSE ELLIPTA) 62.5 MCG/INH AEPB Inhale 1 puff into the lungs daily. 03/03/17  Yes Mannam, Praveen, MD  predniSONE (DELTASONE) 10 MG tablet 4 tabs daily x2 days, 3 tabs x 2 days, 2 tabs x 2 days, 1 tabs x 2 days then stop Patient not taking: Reported on 03/20/2017 02/25/17   Aline August, MD    Physical Exam: Vitals:   03/20/17 1746 03/20/17 1850 03/20/17 1855 03/20/17 1900  BP: 134/83  105/66 130/69  Pulse: 84 75  87  Resp: 20 (!) 21 14 (!) 24  Temp:      TempSrc:      SpO2: 98% 100%  95%  Weight:      Height:  General: in acute respiratory distress HEENT:       Eyes: PERRL, EOMI, no scleral icterus.       ENT: No discharge from the ears and nose, no pharynx injection, no tonsillar enlargement.        Neck: No JVD, no bruit, no mass felt. Heme: No neck lymph node enlargement. Cardiac: S1/S2, RRR, No murmurs, No gallops or rubs. Respiratory: Decreased air movement bilaterally. Wheezing bilaterally.  GI: Soft, nondistended, nontender, no organomegaly, BS present. GU: No hematuria Ext: No pitting leg edema bilaterally. 2+DP/PT pulse bilaterally. Musculoskeletal: No joint deformities, No joint redness or warmth, no limitation of ROM in spin. Skin: Has multiple small skin bruises in legs. Neuro: confused, not oriented X3, cranial nerves II-XII grossly intact, moves all extremities. Psych: Patient is not psychotic, no suicidal or hemocidal ideation.  Labs on Admission: I have personally reviewed following labs and imaging studies  CBC:  Recent Labs Lab 03/20/17 1537  WBC 10.8*  NEUTROABS 9.6*  HGB 13.7  HCT 37.8  MCV 92.6  PLT 062   Basic Metabolic Panel:  Recent Labs Lab 03/20/17 1537  NA 127*  K 3.5  CL <65*  CO2 50*  GLUCOSE 159*  BUN 16  CREATININE 1.00  CALCIUM 8.7*   GFR: Estimated Creatinine Clearance: 36.4 mL/min (by C-G formula based on SCr of 1  mg/dL). Liver Function Tests:  Recent Labs Lab 03/20/17 1537  AST 70*  ALT 12*  ALKPHOS 64  BILITOT 2.7*  PROT 6.4*  ALBUMIN 3.0*   No results for input(s): LIPASE, AMYLASE in the last 168 hours. No results for input(s): AMMONIA in the last 168 hours. Coagulation Profile: No results for input(s): INR, PROTIME in the last 168 hours. Cardiac Enzymes: No results for input(s): CKTOTAL, CKMB, CKMBINDEX, TROPONINI in the last 168 hours. BNP (last 3 results)  Recent Labs  01/13/17 1723  PROBNP 42.0   HbA1C: No results for input(s): HGBA1C in the last 72 hours. CBG: No results for input(s): GLUCAP in the last 168 hours. Lipid Profile: No results for input(s): CHOL, HDL, LDLCALC, TRIG, CHOLHDL, LDLDIRECT in the last 72 hours. Thyroid Function Tests: No results for input(s): TSH, T4TOTAL, FREET4, T3FREE, THYROIDAB in the last 72 hours. Anemia Panel: No results for input(s): VITAMINB12, FOLATE, FERRITIN, TIBC, IRON, RETICCTPCT in the last 72 hours. Urine analysis:    Component Value Date/Time   COLORURINE YELLOW 03/20/2017 [place] 03/20/2017 1704   LABSPEC 1.025 03/20/2017 1704   PHURINE 6.0 03/20/2017 1704   GLUCOSEU NEGATIVE 03/20/2017 1704   GLUCOSEU NEGATIVE 12/29/2016 1239   HGBUR NEGATIVE 03/20/2017 1704   BILIRUBINUR NEGATIVE 03/20/2017 1704   KETONESUR NEGATIVE 03/20/2017 1704   PROTEINUR NEGATIVE 03/20/2017 1704   UROBILINOGEN 0.2 12/29/2016 1239   NITRITE NEGATIVE 03/20/2017 1704   LEUKOCYTESUR NEGATIVE 03/20/2017 1704   Sepsis Labs: @LABRCNTIP (procalcitonin:4,lacticidven:4) )No results found for this or any previous visit (from the past 240 hour(s)).   Radiological Exams on Admission: Dg Chest Port 1 View  Result Date: 03/20/2017 CLINICAL DATA:  COPD exacerbation. EXAM: PORTABLE CHEST 1 VIEW COMPARISON:  Chest x-ray dated March 13, 2017. FINDINGS: The cardiomediastinal silhouette is normal in size. Normal pulmonary vascularity.  Emphysematous changes again noted. No focal consolidation, pleural effusion, or pneumothorax. No acute osseous abnormality. IMPRESSION: COPD.  No active disease. Electronically Signed   By: Titus Dubin M.D.   On: 03/20/2017 15:52     EKG:   Not done in ED, will get one.  Assessment/Plan Principal Problem:   Acute on chronic respiratory failure with hypoxia and hypercapnia (HCC) Active Problems:   Anxiety and depression   COPD exacerbation (HCC)   Mild cognitive impairment   Hyperlipidemia   Sepsis (HCC)   Chronic diastolic CHF (congestive heart failure) (HCC)   Acute metabolic encephalopathy   Hypochloremic alkalosis   Hyponatremia   Acute on chronic respiratory failure with hypoxia and hypercapnia due to COPD exacerbation and sepsis: Patient has a bilateral wheezing, decreased air movement shortness of breath and a cough without infiltration on chest x-ray, consistent with COPD exacerbation. Patient meets criteria for sepsis with hypotension, tachycardia, tachypnea. Lactic acid elevated at 4.63. Blood pressure responded to IV fluid, improved to 130/69 after 1.5 L normal saline. Currently hemodynamically stable.  -will admit patient to SDU as inpt -Nebulizers: scheduled Atrovent and prn Xopenex Nebs -Solu-Medrol 60 mg IV TID -IV azithromycin (pt received one dose of vancomycin and Zosyn) -Mucinex for cough  -Urine S. pneumococcal antigen -Follow up blood culture x2, sputum culture, respiratory virus panel -will get Procalcitonin and trend lactic acid levels per sepsis protocol. -IVF: 2.5 L of NS bolus in ED, followed by 125 cc/h   Hyponatremia: Sodium 127. Most likely due to decreased oral intake and diuretics use of Lasix, HCTZ -IV fluid: 2.5 L normal saline, followed by 125 mL per hour -Follow-up by BMP -hold lasix and HCTZ  Mild cognitive impairment: -continue donepezil  Anxiety and depression: -continue Lexapro -Decrease clonidine dose from 0.5-0.25 mg twice a day  when necessary  HLD: -Lipitor  Chronic diastolic CHF (congestive heart failure) ([place]): 2-D echo on 02/12/17 showed EF of 55-60% with grade 1 diastolic dysfunction. Patient does not have leg edema or JVD. CHF is compensated. -Hold her Lasix due to sepsis. -Check BNP   Electrolytes disturbance: Hypochloremic alkalosis, respiratory acidosis, hyponatremia, hypokalemia -IVF as above -f/u by BMP  Hypokalemia: K= 2.9 on the repeated BMP  on admission. - Repleted with KCl  By IV and orally  - Check Mg level  Acute metabolic encephalopathy: Likely multifactorial etiology, including hypoxia, sepsis, hyponatremia, electrolytes disturbance in the setting of cognitive impairment. -Treat underlying issues as above -Frequent neuro check   DVT ppx: SQ Lovenox Code Status: Full code Family Communication: None at bed side.     Disposition Plan:  Anticipate discharge back to previous home environment Consults called:  none Admission status: SDU/inpation       Date of Service 03/20/2017    Ivor Costa [facility] Pager [phone]  If 7PM-7AM, please contact night-coverage [email or web address] Password [facility] 03/20/2017, 7:55 PM

## 2017-03-20 NOTE — ED Triage Notes (Signed)
Per EMS- Patient c/o increased SOB and weakness x 1 week.  Home health nurse reported that her BP has been trending downward x 1 week as well. Patient received NS 200 ml, solumedrol 125 mg Iv and Albuterol 10 mg/Atrovent 5 mg neb prior to arrival to the ED.

## 2017-03-20 NOTE — Progress Notes (Signed)
A consult was received from an ED physician for vancomycin and zosyn per pharmacy dosing.  The patient's profile has been reviewed for ht/wt/allergies/indication/available labs.   A one time order has been placed for Vancomycin 750 mg and Zosyn 3.375g.  Further antibiotics/pharmacy consults should be ordered by admitting physician if indicated.                       Thank you,  Hershal Coria, PharmD Pager: [phone] 03/20/2017

## 2017-03-20 NOTE — ED Provider Notes (Addendum)
[facility] DEPT Provider Note   CSN: [id number] Arrival date & time: 03/20/17  1424     History   Chief Complaint Chief Complaint  Patient presents with  . Shortness of Breath  . Weakness    HPI Michele Martin is a 71 y.o. female.Chief complaint is weakness, and difficulty breathing  HPI 71 year old female. History of COPD. On home O2 at 2 L. Also dementia, CHF, anxiety and depression, chronic bronchitis, chronic pain, benign positional vertigo, hyperlipidemia, benign meningioma, malnutrition, B-12 deficiency,  Past Medical History:  Diagnosis Date  . Acute angle-closure glaucoma 02/14/2010  . ALLERGIC RHINITIS 09/19/2009  . ANXIETY 04/15/2010  . Arthritis   . ASTHMA 09/19/2009  . BACK PAIN 02/14/2010  . Chronic bronchitis 04/14/2011  . CHRONIC OBSTRUCTIVE PULMONARY DISEASE, ACUTE EXACERBATION 12/19/2009  . Complication of anesthesia   . COPD 06/27/2009  . DEPRESSION 09/19/2009  . HOARSENESS 10/17/2009  . HYPERLIPIDEMIA 09/19/2009  . MENOPAUSAL DISORDER 08/20/2010  . OSTEOPENIA 09/10/2010  . Osteoporosis, unspecified 04/20/2014  . Rotator cuff tear 06/23/2011  . TINNITUS 09/10/2010  . TREMOR 02/14/2010  . Tremor 07/30/2011  . URI 09/10/2010  . Wheezing 06/27/2009    Patient Active Problem List   Diagnosis Date Noted  . Acute metabolic encephalopathy 96/78/9381  . Hypochloremic alkalosis 03/20/2017  . Hyponatremia 03/20/2017  . Tachycardia 03/15/2017  . Rib pain on left side 03/13/2017  . Rib pain on right side 03/13/2017  . Elevated troponin 02/23/2017  . Malnutrition of moderate degree 02/23/2017  . Acute on chronic respiratory failure with hypoxia and hypercapnia ([place]) 02/22/2017  . Hypokalemia 02/22/2017  . Chronic diastolic CHF (congestive heart failure) ([place]) 01/13/2017  . Chronic respiratory failure with hypoxia and hypercapnia ([place]) 01/13/2017  . Sepsis ([place]) 12/05/2016  . Acute lower UTI 12/05/2016  . Acute respiratory failure with hypoxia ([place]) 12/05/2016   . COPD with acute exacerbation ([place]) 12/05/2016  . Abnormal TSH 10/28/2016  . B12 deficiency 09/18/2016  . Abnormal finding on screening procedure 09/18/2016  . Risk for falls 08/27/2016  . Protein-calorie malnutrition, severe 08/06/2016  . COPD, group D, by GOLD 2017 classification ([place]) 08/05/2016  . Acute respiratory failure with hypoxia and hypercapnia ([place]) 07/26/2016  . Grief reaction 05/15/2016  . Rotator cuff tear arthropathy of both shoulders 04/17/2016  . Neck pain on right side 03/13/2016  . Angioedema 10/23/2015  . Cough 09/07/2015  . Benign neoplasm of meninges ([place]) 08/29/2015  . Dizziness and giddiness 08/29/2015  . Mild cognitive impairment 08/10/2015  . Benign paroxysmal positional vertigo 08/10/2015  . Hyperlipidemia 08/10/2015  . Memory dysfunction 07/03/2015  . Solitary pulmonary nodule 05/09/2015  . Right hip pain 01/23/2015  . Chest pain 10/25/2014  . Right sided sciatica 10/11/2014  . Chronic pain syndrome 10/11/2014  . Osteoporosis 04/20/2014  . COPD exacerbation ([place]) 08/25/2013  . General weakness 12/22/2012  . Personal history of colonic polyps 10/25/2012  . Anterior neck pain 10/25/2012  . COPD GOLD III/ still smoking  08/24/2012  . Shingles 07/14/2012  . Orthostatic hypotension 05/07/2012  . Involuntary movements 11/19/2011  . Tremor 07/30/2011  . Rotator cuff tear 06/23/2011  . Lower back pain 06/23/2011  . Balance problem 06/23/2011  . Tobacco abuse 05/22/2011  . Preop exam for internal medicine 05/13/2011  . Chronic bronchitis ([place]) 04/14/2011  . Peripheral edema 01/13/2011  . Right shoulder pain 01/13/2011  . Fatigue 12/03/2010  . Encounter for well adult exam with abnormal findings 10/31/2010  . TINNITUS 09/10/2010  . MENOPAUSAL DISORDER 08/20/2010  .  Anxiety and depression 04/15/2010  . Acute angle-closure glaucoma 02/14/2010  . HOARSENESS 10/17/2009  . Allergic rhinitis 09/19/2009    Past Surgical History:  Procedure  Laterality Date  . ABDOMINAL HYSTERECTOMY    . cervical fusion 2013 - Dr Vertell Limber    . Normal Heart Cath  2001   Dr. Johnsie Cancel  . s/p laser tx for glaucoma     no vision loss    OB History    No data available       Home Medications    Prior to Admission medications   Medication Sig Start Date End Date Taking? Authorizing Provider  albuterol (PROVENTIL HFA) 108 (90 Base) MCG/ACT inhaler Inhale 2 puffs into the lungs every 6 (six) hours as needed for wheezing or shortness of breath. 02/06/17  Yes Biagio Borg, MD  albuterol (PROVENTIL) (2.5 MG/3ML) 0.083% nebulizer solution Take 3 mLs (2.5 mg total) by nebulization every 6 (six) hours as needed for wheezing or shortness of breath. 02/10/17  Yes Biagio Borg, MD  aspirin EC 81 MG tablet Take 1 tablet (81 mg total) by mouth at bedtime. 03/18/17  Yes Biagio Borg, MD  atorvastatin (LIPITOR) 20 MG tablet Take 1 tablet (20 mg total) by mouth at bedtime. 02/06/17  Yes Biagio Borg, MD  b complex vitamins tablet Take 1 tablet by mouth at bedtime. 03/18/17  Yes Biagio Borg, MD  clonazePAM (KLONOPIN) 0.5 MG tablet Take 1 tablet (0.5 mg total) by mouth 2 (two) times daily as needed for anxiety. 08/20/16  Yes Biagio Borg, MD  donepezil (ARICEPT) 10 MG tablet Take 1 tablet (10 mg total) by mouth at bedtime. 03/18/17  Yes Biagio Borg, MD  escitalopram (LEXAPRO) 20 MG tablet Take 0.5 tablets (10 mg total) by mouth daily. 08/20/16  Yes Biagio Borg, MD  fluticasone furoate-vilanterol (BREO ELLIPTA) 200-25 MCG/INH AEPB Inhale 1 puff into the lungs daily. 03/03/17  Yes Mannam, Praveen, MD  furosemide (LASIX) 40 MG tablet Take 1 tablet (40 mg total) by mouth daily. 01/13/17  Yes Biagio Borg, MD  gabapentin (NEURONTIN) 100 MG capsule TAKE 1 CAPSULE BY MOUTH THREE TIMES DAILY 01/16/17  Yes Biagio Borg, MD  guaiFENesin (MUCINEX) 600 MG 12 hr tablet Take 1 tablet (600 mg total) by mouth 2 (two) times daily. 03/18/17  Yes Biagio Borg, MD  hydrochlorothiazide  (HYDRODIURIL) 25 MG tablet Take 25 mg by mouth daily.   Yes [provider]  montelukast (SINGULAIR) 10 MG tablet TAKE 1 TABLET BY MOUTH EVERY NIGHT AT BEDTIME 03/26/16  Yes Biagio Borg, MD  potassium chloride (K-DUR) 10 MEQ tablet Take 1 tablet (10 mEq total) by mouth daily. 03/18/17  Yes Biagio Borg, MD  Probiotic CAPS Take 1 capsule by mouth every day 03/18/17  Yes Biagio Borg, MD  roflumilast (DALIRESP) 500 MCG TABS tablet Take 1 tablet (500 mcg total) by mouth daily. 03/18/17  Yes Biagio Borg, MD  traMADol (ULTRAM) 50 MG tablet Take 1 tablet (50 mg total) by mouth every 8 (eight) hours as needed. Patient taking differently: Take 50 mg by mouth every 8 (eight) hours as needed for moderate pain or severe pain.  03/13/17  Yes Biagio Borg, MD  umeclidinium bromide (INCRUSE ELLIPTA) 62.5 MCG/INH AEPB Inhale 1 puff into the lungs daily. 03/03/17  Yes Mannam, Praveen, MD  predniSONE (DELTASONE) 10 MG tablet 4 tabs daily x2 days, 3 tabs x 2 days, 2 tabs x 2  days, 1 tabs x 2 days then stop Patient not taking: Reported on 03/20/2017 02/25/17   Aline August, MD    Family History Family History  Problem Relation Age of Onset  . Dementia Mother   . Rheum arthritis Maternal Uncle   . Colon cancer Neg Hx     Social History Social History  Substance Use Topics  . Smoking status: Former Smoker    Packs/day: 0.50    Years: 50.00    Types: Cigarettes    Start date: 07/03/2015  . Smokeless tobacco: Never Used     Comment: stopped smoking for 1 month  . Alcohol use No     Allergies   Pravastatin; Simvastatin; and Statins   Review of Systems Review of Systems  Unable to perform ROS: Acuity of condition     Physical Exam Updated Vital Signs BP 130/69   Pulse 87   Temp 97.9 F (36.6 C) (Rectal)   Resp (!) 24   Ht 5\' 1"  (1.549 m)   Wt 45.4 kg (100 lb)   SpO2 95%   BMI 18.89 kg/m   Physical Exam  Constitutional: She appears well-developed and well-nourished. She appears  distressed.  HENT:  Head: Normocephalic.  Eyes: Pupils are equal, round, and reactive to light. Conjunctivae are normal. No scleral icterus.  Neck: Normal range of motion. Neck supple. No thyromegaly present.  Cardiovascular: Normal rate and regular rhythm.  Exam reveals no gallop and no friction rub.   No murmur heard. Pulmonary/Chest: No respiratory distress. She has no wheezes. She has no rales.  Shortness of breath. Distress.  Abdominal: Soft. Bowel sounds are normal. She exhibits no distension. There is no tenderness. There is no rebound.  Musculoskeletal: Normal range of motion.  Neurological: She is alert.  Skin: Skin is warm and dry. No rash noted.  Psychiatric:  Confused     ED Treatments / Results  Labs (all labs ordered are listed, but only abnormal results are displayed) Labs Reviewed  CBC WITH DIFFERENTIAL/PLATELET - Abnormal; Notable for the following:       Result Value   WBC 10.8 (*)    MCHC 36.2 (*)    Neutro Abs 9.6 (*)    All other components within normal limits  COMPREHENSIVE METABOLIC PANEL - Abnormal; Notable for the following:    Sodium 127 (*)    Chloride <65 (*)    CO2 50 (*)    Glucose, Bld 159 (*)    Calcium 8.7 (*)    Total Protein 6.4 (*)    Albumin 3.0 (*)    AST 70 (*)    ALT 12 (*)    Total Bilirubin 2.7 (*)    GFR calc non Af Amer 55 (*)    All other components within normal limits  BLOOD GAS, ARTERIAL - Abnormal; Notable for the following:    pH, Arterial 7.533 (*)    pCO2 arterial 62.8 (*)    pO2, Arterial 192 (*)    Bicarbonate 52.6 (*)    Acid-Base Excess 24.4 (*)    All other components within normal limits  I-STAT CG4 LACTIC ACID, ED - Abnormal; Notable for the following:    Lactic Acid, Venous 4.63 (*)    All other components within normal limits  URINE CULTURE  CULTURE, BLOOD (ROUTINE X 2)  CULTURE, BLOOD (ROUTINE X 2)  RESPIRATORY PANEL BY PCR  CULTURE, EXPECTORATED SPUTUM-ASSESSMENT  GRAM STAIN  BRAIN NATRIURETIC  PEPTIDE  URINALYSIS, ROUTINE W REFLEX MICROSCOPIC  OSMOLALITY  OSMOLALITY, URINE  SODIUM, URINE, RANDOM  TSH  TROPONIN I  TROPONIN I  TROPONIN I  STREP PNEUMONIAE URINARY ANTIGEN  LACTIC ACID, PLASMA  LACTIC ACID, PLASMA  PROCALCITONIN  PROTIME-INR  BASIC METABOLIC PANEL  COMPREHENSIVE METABOLIC PANEL  CBC  I-STAT TROPONIN, ED  I-STAT CHEM 8, ED    EKG  EKG Interpretation None       Radiology Dg Chest Port 1 View  Result Date: 03/20/2017 CLINICAL DATA:  COPD exacerbation. EXAM: PORTABLE CHEST 1 VIEW COMPARISON:  Chest x-ray dated March 13, 2017. FINDINGS: The cardiomediastinal silhouette is normal in size. Normal pulmonary vascularity. Emphysematous changes again noted. No focal consolidation, pleural effusion, or pneumothorax. No acute osseous abnormality. IMPRESSION: COPD.  No active disease. Electronically Signed   By: Titus Dubin M.D.   On: 03/20/2017 15:52    Procedures Procedures (including critical care time)  Medications Ordered in ED Medications  ipratropium (ATROVENT) nebulizer solution 0.5 mg (not administered)  aspirin EC tablet 81 mg (not administered)  b complex vitamins tablet 1 tablet (not administered)  donepezil (ARICEPT) tablet 10 mg (not administered)  guaiFENesin (MUCINEX) 12 hr tablet 600 mg (not administered)  Probiotic CAPS 1 capsule (not administered)  roflumilast (DALIRESP) tablet 500 mcg (not administered)  traMADol (ULTRAM) tablet 50 mg (not administered)  umeclidinium bromide (INCRUSE ELLIPTA) 62.5 MCG/INH 1 puff (not administered)  atorvastatin (LIPITOR) tablet 20 mg (not administered)  gabapentin (NEURONTIN) capsule 100 mg (not administered)  clonazePAM (KLONOPIN) tablet 0.25 mg (not administered)  escitalopram (LEXAPRO) tablet 10 mg (not administered)  montelukast (SINGULAIR) tablet 10 mg (not administered)  levalbuterol (XOPENEX) nebulizer solution 1.25 mg (not administered)  methylPREDNISolone sodium succinate (SOLU-MEDROL)  125 mg/2 mL injection 60 mg (not administered)  azithromycin (ZITHROMAX) 500 mg in dextrose 5 % 250 mL IVPB (not administered)  ondansetron (ZOFRAN) injection 4 mg (not administered)  enoxaparin (LOVENOX) injection 40 mg (not administered)  acetaminophen (TYLENOL) suppository 650 mg (not administered)  0.9 %  sodium chloride infusion (not administered)  albuterol (PROVENTIL,VENTOLIN) solution continuous neb (10 mg/hr Nebulization Given 03/20/17 1532)  ipratropium (ATROVENT) nebulizer solution 0.5 mg (0.5 mg Nebulization Given 03/20/17 1532)  sodium chloride 0.9 % bolus 1,000 mL (0 mLs Intravenous Stopped 03/20/17 1840)    And  sodium chloride 0.9 % bolus 500 mL (0 mLs Intravenous Stopped 03/20/17 1802)  piperacillin-tazobactam (ZOSYN) IVPB 3.375 g (0 g Intravenous Stopped 03/20/17 1802)  vancomycin (VANCOCIN) IVPB 750 mg/150 ml premix (0 mg Intravenous Stopped 03/20/17 1853)     Initial Impression / Assessment and Plan / ED Course  I have reviewed the triage vital signs and the nursing notes.  Pertinent labs & imaging results that were available during my care of the patient were reviewed by me and considered in my medical decision making (see chart for details).     Patient given continuous albuterol neb for 45 minutes. Has improvement in aeration and actually increasing wheezing with better air movement. ABG shows mixed acid base with chronic metabolic alkalosis and incomplete compensation respiratory acidosis. Mild hyponatremia. Elevated lactate. Given lactate fluids at 30 mL/kg and broad-spectrum antibiotics. No obvious source noted. Discussed the case with Dr.Niu.  Appreciate Dr. Edgar Frisk input. He called me immediately is always we have discussed the patient and formulated plan for her admission under his care.  CRITICAL CARE Performed by: Tanna Furry JOSEPH   Total critical care time: 30 minutes  Critical care time was exclusive of separately billable procedures and treating other  patients.  Critical  care was necessary to treat or prevent imminent or life-threatening deterioration.  Critical care was time spent personally by me on the following activities: development of treatment plan with patient and/or surrogate as well as nursing, discussions with consultants, evaluation of patient's response to treatment, examination of patient, obtaining history from patient or surrogate, ordering and performing treatments and interventions, ordering and review of laboratory studies, ordering and review of radiographic studies, pulse oximetry and re-evaluation of patient's condition.   Final Clinical Impressions(s) / ED Diagnoses   Final diagnoses:  COPD exacerbation ([place])  Encephalopathy  Sepsis, due to unspecified organism [facility])    New Prescriptions New Prescriptions   No medications on file     Tanna Furry, MD 03/20/17 2045    Tanna Furry, MD 04/11/17 2110

## 2017-03-21 DIAGNOSIS — E871 Hypo-osmolality and hyponatremia: Secondary | ICD-10-CM

## 2017-03-21 LAB — OSMOLALITY: Osmolality: 281 mOsm/kg (ref 275–295)

## 2017-03-21 LAB — CBC
HCT: 29.4 % — ABNORMAL LOW (ref 36.0–46.0)
Hemoglobin: 10.5 g/dL — ABNORMAL LOW (ref 12.0–15.0)
MCH: 33.3 pg (ref 26.0–34.0)
MCHC: 35.7 g/dL (ref 30.0–36.0)
MCV: 93.3 fL (ref 78.0–100.0)
PLATELETS: 211 10*3/uL (ref 150–400)
RBC: 3.15 MIL/uL — AB (ref 3.87–5.11)
RDW: 12.6 % (ref 11.5–15.5)
WBC: 6.8 10*3/uL (ref 4.0–10.5)

## 2017-03-21 LAB — BLOOD CULTURE ID PANEL (REFLEXED)
ACINETOBACTER BAUMANNII: NOT DETECTED
CANDIDA KRUSEI: NOT DETECTED
CANDIDA PARAPSILOSIS: NOT DETECTED
CANDIDA TROPICALIS: NOT DETECTED
Candida albicans: NOT DETECTED
Candida glabrata: NOT DETECTED
ESCHERICHIA COLI: NOT DETECTED
Enterobacter cloacae complex: NOT DETECTED
Enterobacteriaceae species: NOT DETECTED
Enterococcus species: NOT DETECTED
HAEMOPHILUS INFLUENZAE: NOT DETECTED
KLEBSIELLA OXYTOCA: NOT DETECTED
KLEBSIELLA PNEUMONIAE: NOT DETECTED
Listeria monocytogenes: NOT DETECTED
METHICILLIN RESISTANCE: NOT DETECTED
Neisseria meningitidis: NOT DETECTED
PROTEUS SPECIES: NOT DETECTED
Pseudomonas aeruginosa: NOT DETECTED
SERRATIA MARCESCENS: NOT DETECTED
STAPHYLOCOCCUS AUREUS BCID: NOT DETECTED
STAPHYLOCOCCUS SPECIES: DETECTED — AB
STREPTOCOCCUS PNEUMONIAE: NOT DETECTED
Streptococcus agalactiae: NOT DETECTED
Streptococcus pyogenes: NOT DETECTED
Streptococcus species: NOT DETECTED

## 2017-03-21 LAB — COMPREHENSIVE METABOLIC PANEL
ALT: 21 U/L (ref 14–54)
AST: 40 U/L (ref 15–41)
Albumin: 2.6 g/dL — ABNORMAL LOW (ref 3.5–5.0)
Alkaline Phosphatase: 53 U/L (ref 38–126)
Anion gap: 13 (ref 5–15)
BILIRUBIN TOTAL: 0.5 mg/dL (ref 0.3–1.2)
BUN: 17 mg/dL (ref 6–20)
CO2: 31 mmol/L (ref 22–32)
CREATININE: 0.77 mg/dL (ref 0.44–1.00)
Calcium: 7.6 mg/dL — ABNORMAL LOW (ref 8.9–10.3)
Chloride: 87 mmol/L — ABNORMAL LOW (ref 101–111)
Glucose, Bld: 193 mg/dL — ABNORMAL HIGH (ref 65–99)
POTASSIUM: 2.6 mmol/L — AB (ref 3.5–5.1)
Sodium: 131 mmol/L — ABNORMAL LOW (ref 135–145)
TOTAL PROTEIN: 5.3 g/dL — AB (ref 6.5–8.1)

## 2017-03-21 LAB — RESPIRATORY PANEL BY PCR
Adenovirus: NOT DETECTED
BORDETELLA PERTUSSIS-RVPCR: NOT DETECTED
CORONAVIRUS HKU1-RVPPCR: NOT DETECTED
CORONAVIRUS NL63-RVPPCR: NOT DETECTED
Chlamydophila pneumoniae: NOT DETECTED
Coronavirus 229E: NOT DETECTED
Coronavirus OC43: NOT DETECTED
INFLUENZA A-RVPPCR: NOT DETECTED
Influenza B: NOT DETECTED
METAPNEUMOVIRUS-RVPPCR: NOT DETECTED
Mycoplasma pneumoniae: NOT DETECTED
PARAINFLUENZA VIRUS 2-RVPPCR: NOT DETECTED
PARAINFLUENZA VIRUS 3-RVPPCR: NOT DETECTED
PARAINFLUENZA VIRUS 4-RVPPCR: NOT DETECTED
Parainfluenza Virus 1: NOT DETECTED
RHINOVIRUS / ENTEROVIRUS - RVPPCR: NOT DETECTED
Respiratory Syncytial Virus: NOT DETECTED

## 2017-03-21 LAB — TROPONIN I: Troponin I: <0.03 ng/mL (ref <0.03)

## 2017-03-21 LAB — C DIFFICILE QUICK SCREEN W PCR REFLEX
C DIFFICILE (CDIFF) TOXIN: NEGATIVE
C DIFFICLE (CDIFF) ANTIGEN: NEGATIVE
C Diff interpretation: NOT DETECTED

## 2017-03-21 LAB — TSH: TSH: 0.121 u[IU]/mL — ABNORMAL LOW (ref 0.350–4.500)

## 2017-03-21 LAB — LACTIC ACID, PLASMA
LACTIC ACID, VENOUS: 4.6 mmol/L — AB (ref 0.5–1.9)
Lactic Acid, Venous: 8.1 mmol/L (ref 0.5–1.9)

## 2017-03-21 MED ORDER — SODIUM CHLORIDE 0.9 % IV BOLUS (SEPSIS)
1000.0000 mL | Freq: Once | INTRAVENOUS | Status: AC
  Administered 2017-03-21: 1000 mL via INTRAVENOUS

## 2017-03-21 MED ORDER — POTASSIUM CHLORIDE CRYS ER 20 MEQ PO TBCR
40.0000 meq | EXTENDED_RELEASE_TABLET | Freq: Once | ORAL | Status: AC
  Administered 2017-03-21: 40 meq via ORAL
  Filled 2017-03-21: qty 2

## 2017-03-21 MED ORDER — BUDESONIDE 0.25 MG/2ML IN SUSP
0.2500 mg | Freq: Two times a day (BID) | RESPIRATORY_TRACT | Status: DC
  Administered 2017-03-21 – 2017-03-26 (×11): 0.25 mg via RESPIRATORY_TRACT
  Filled 2017-03-21 (×11): qty 2

## 2017-03-21 MED ORDER — POTASSIUM CHLORIDE 20 MEQ/15ML (10%) PO SOLN
40.0000 meq | Freq: Once | ORAL | Status: AC
  Administered 2017-03-21: 40 meq via ORAL
  Filled 2017-03-21: qty 30

## 2017-03-21 MED ORDER — MAGNESIUM SULFATE 2 GM/50ML IV SOLN
2.0000 g | Freq: Once | INTRAVENOUS | Status: AC
  Administered 2017-03-21: 2 g via INTRAVENOUS
  Filled 2017-03-21: qty 50

## 2017-03-21 MED ORDER — DOXYCYCLINE HYCLATE 100 MG IV SOLR
100.0000 mg | Freq: Two times a day (BID) | INTRAVENOUS | Status: DC
  Administered 2017-03-21 – 2017-03-23 (×6): 100 mg via INTRAVENOUS
  Filled 2017-03-21 (×8): qty 100

## 2017-03-21 MED ORDER — SODIUM CHLORIDE 0.9 % IV BOLUS (SEPSIS)
500.0000 mL | Freq: Once | INTRAVENOUS | Status: AC
  Administered 2017-03-21: 500 mL via INTRAVENOUS

## 2017-03-21 NOTE — Evaluation (Signed)
Physical Therapy Evaluation Patient Details Name: Michele Martin MRN: [id number] DOB: [birth year] Today's Date: 03/21/2017   History of Present Illness  Michele Martin is a 71 y.o. female was admitted with  history of asthma/COPD and chronic respiratory failure as well as angle closure glaucoma, allergic rhinitis, sinusitis, osteoarthritis, chronic back pain, anxiety/depression, hyperlipidemia, osteopenia, and tremor who presented to the ED via EMS for multiple falls in the setting of worsening dyspnea  Clinical Impression  Pt admitted with above diagnosis. Pt currently with functional limitations due to the deficits listed below (see PT Problem List).  Pt will benefit from skilled PT to increase their independence and safety with mobility to allow discharge to the venue listed below.   Pt is motivated to work with therapy despite having avery busy day, transferring to floor etc; will recommend HHPT and will continue to follow in acute setting     Follow Up Recommendations Home health PT;Supervision - Intermittent    Equipment Recommendations  None recommended by PT    Recommendations for Other Services       Precautions / Restrictions Precautions Precautions: Fall Precaution Comments: (P) watch 02 sats Restrictions Weight Bearing Restrictions: (P) No      Mobility  Bed Mobility               General bed mobility comments: OOB to chair with OT  Transfers Overall transfer level: Needs assistance Equipment used: Rolling walker (2 wheeled) Transfers: Sit to/from Stand Sit to Stand: Min guard         General transfer comment: cues for hand placement and safety  Ambulation/Gait Ambulation/Gait assistance: Min assist Ambulation Distance (Feet): 40 Feet Assistive device: Rolling walker (2 wheeled) Gait Pattern/deviations: Step-through pattern;Decreased stride length;Trunk flexed;Drifts right/left     General Gait Details:     assist to maneuver RW, cues  for pace and posture;        O2 sats 98-100% on 2-3 L throughout  Stairs            Wheelchair Mobility    Modified Rankin (Stroke Patients Only)       Balance Overall balance assessment: Needs assistance           Standing balance-Leahy Scale: Fair                               Pertinent Vitals/Pain Pain Assessment: No/denies pain    Home Living Family/patient expects to be discharged to:: Private residence Living Arrangements: Alone Available Help at Discharge: Friend(s);Family;Available PRN/intermittently Type of Home: House Home Access: Stairs to enter Entrance Stairs-Rails: Can reach both Entrance Stairs-Number of Steps: 2 + 3 Home Layout: One level Home Equipment: Grab bars - toilet;Grab bars - tub/shower;Shower seat Additional Comments:  she enjoys walking as much as possible when feeling well and taking care of her dog when she is not ill    Prior Function Level of Independence: Independent;Independent with assistive device(s)         Comments: drives, uses O2 52/8     Hand Dominance        Extremity/Trunk Assessment   Upper Extremity Assessment Upper Extremity Assessment:  (baseline tremors)    Lower Extremity Assessment Lower Extremity Assessment: Generalized weakness       Communication   Communication: (P) No difficulties  Cognition Arousal/Alertness: Awake/alert Behavior During Therapy: WFL for tasks assessed/performed Overall Cognitive Status: Within Functional Limits for tasks assessed  General Comments: (P) per chart, mild cognitive impairment      General Comments      Exercises     Assessment/Plan    PT Assessment Patient needs continued PT services  PT Problem List Decreased mobility;Decreased knowledge of use of DME;Cardiopulmonary status limiting activity;Decreased activity tolerance       PT Treatment Interventions DME instruction;Gait  training;Functional mobility training;Therapeutic activities;Therapeutic exercise;Stair training;Patient/family education    PT Goals (Current goals can be found in the Care Plan section)  Acute Rehab PT Goals Patient Stated Goal: get better, be able to walk and breathe PT Goal Formulation: With patient Time For Goal Achievement: 04/03/17    Frequency Min 3X/week   Barriers to discharge        Co-evaluation               AM-PAC PT "6 Clicks" Daily Activity  Outcome Measure Difficulty turning over in bed (including adjusting bedclothes, sheets and blankets)?: Unable Difficulty moving from lying on back to sitting on the side of the bed? : Unable Difficulty sitting down on and standing up from a chair with arms (e.g., wheelchair, bedside commode, etc,.)?: Unable Help needed moving to and from a bed to chair (including a wheelchair)?: A Little Help needed walking in hospital room?: A Little Help needed climbing 3-5 steps with a railing? : A Little 6 Click Score: 12    End of Session Equipment Utilized During Treatment: Gait belt;Oxygen Activity Tolerance: Patient limited by fatigue Patient left: in chair;with call bell/phone within reach   PT Visit Diagnosis: Difficulty in walking, not elsewhere classified (R26.2)    Time: 7482-7078 PT Time Calculation (min) (ACUTE ONLY): 16 min   Charges:   PT Evaluation $PT Eval Low Complexity: 1 Low     PT G CodesKenyon Ana, PT Pager: [phone] 03/21/2017   [facility] 03/21/2017, 3:07 PM

## 2017-03-21 NOTE — Progress Notes (Addendum)
CRITICAL VALUE ALERT  Critical Value:  Lactic Acid 6.9, K+ <2.0  Date & Time Notied:  03/20/2017 2131  Provider Notified: X. Blount  Orders Received/Actions taken: 1L NS bolus and electrolyte replacement.

## 2017-03-21 NOTE — Progress Notes (Signed)
PROGRESS NOTE  [name]:[id number] DOB: 03-29-1946 DOA: 03/20/2017 PCP: Biagio Borg, MD   LOS: 1 day   Brief Narrative / Interim history: Michele Martin is a 71 y.o. female with medical history significant of mild cognitive impairment , hyperlipidemia, COPD, asthma, depression, anxiety, tremor, dCHF, who presents with shortness of breath.  She was admitted to stepdown with COPD exacerbation  Assessment & Plan: Principal Problem:   Acute on chronic respiratory failure with hypoxia and hypercapnia (HCC) Active Problems:   Anxiety and depression   COPD exacerbation (HCC)   Mild cognitive impairment   Hyperlipidemia   SIRS (systemic inflammatory response syndrome) (HCC)   Chronic diastolic CHF (congestive heart failure) (HCC)   Acute metabolic encephalopathy   Hypochloremic alkalosis   Hyponatremia   COPD exacerbation -with SIRS, no source clearly identified so would not call this sepsis today -Continue steroids, continue levalbuterol/ipratropium -Add Pulmicort -Lactic acid improving, continue fluids -Blood pressure responding to fluids, continue -Respiratory virus panel pending -Change antibiotics to doxycycline  Hyponatremia -Likely due to decreased oral intake and diuretics -Hold home diuretics, sodium improving with fluids, continue fluids  Hypokalemia -Likely due to home diuretics and poor p.o. intake -Supplement and recheck tomorrow morning  Chronic diastolic CHF -2D echo in July 2018 has an EF of 55-60% with grade 1 diastolic dysfunction.  Currently she appears euvolemic.  Hold her diuretics, continue fluids as above, closely monitor volume status  Acute on chronic hypoxic and hypercarbic respiratory failure -In the setting of COPD, uses home oxygen.  Acute encephalopathy -Due to COPD exacerbation and hypoxic and hypercarbic respiratory failure  Mild cognitive impairment -Oriented oriented 4, function at home, continue donepezil  Anxiety  depression -Continue Lexapro  Hyperlipidemia -Continue Lipitor   DVT prophylaxis: Lovenox Code Status: Full code Family Communication: No family at bedside Disposition Plan: Transfer to floor  Consultants:   None  Procedures:   None   Antimicrobials:  Azithromycin 8/24 >> 8/25  Doxycycline 8/25 >>  Subjective: - no chest pain, shortness of breath, no abdominal pain, nausea or vomiting.  She wants to go home as soon as possible as she misses her dog.  She is not sure why she is in the hospital today and does not recall much of the previous day  Objective: Vitals:   03/21/17 0600 03/21/17 0700 03/21/17 0800 03/21/17 0916  BP: (!) 115/55 (!) 97/53 (!) 94/49   Pulse: 68 73 78   Resp: 15 15 16    Temp:      TempSrc:      SpO2: 100% 99% 96% 97%  Weight:      Height:        Intake/Output Summary (Last 24 hours) at 03/21/17 1047 Last data filed at 03/21/17 0930  Gross per 24 hour  Intake           4597.5 ml  Output                4 ml  Net           4593.5 ml   Filed Weights   03/20/17 1439 03/21/17 0411  Weight: 45.4 kg (100 lb) 52 kg (114 lb 10.2 oz)    Examination:  Vitals:   03/21/17 0600 03/21/17 0700 03/21/17 0800 03/21/17 0916  BP: (!) 115/55 (!) 97/53 (!) 94/49   Pulse: 68 73 78   Resp: 15 15 16    Temp:      TempSrc:      SpO2: 100% 99%  96% 97%  Weight:      Height:        Constitutional: NAD Eyes: lids and conjunctivae normal ENMT: Mucous membranes are moist.  Neck: normal, supple Respiratory: clear to auscultation bilaterally, no wheezing, no crackles. Normal respiratory effort. No accessory muscle use.  Overall decreased breath sounds Cardiovascular: Regular rate and rhythm, no murmurs / rubs / gallops. No LE edema. 2+ pedal pulses.  Abdomen: no tenderness.  Musculoskeletal: no clubbing / cyanosis.  Skin: no rashes, lesions, ulcers. No induration Neurologic: CN 2-12 grossly intact. Strength 5/5 in all 4.    Data Reviewed: I have  independently reviewed following labs and imaging studies  CXR 8/24 - no acute processes EKG - normal sinus rhythm with prolonged QT  CBC:  Recent Labs Lab 03/20/17 1537 03/21/17 0225  WBC 10.8* 6.8  NEUTROABS 9.6*  --   HGB 13.7 10.5*  HCT 37.8 29.4*  MCV 92.6 93.3  PLT 271 892   Basic Metabolic Panel:  Recent Labs Lab 03/20/17 1537 03/20/17 1955 03/20/17 2228 03/21/17 0225  NA 127* 129*  --  131*  K 3.5 <2.0* <2.0* 2.6*  CL <65* 75*  --  87*  CO2 50* 40*  --  31  GLUCOSE 159* 294*  --  193*  BUN 16 17  --  17  CREATININE 1.00 0.96  --  0.77  CALCIUM 8.7* 8.0*  --  7.6*  MG  --   --  1.3*  --    GFR: Estimated Creatinine Clearance: 48 mL/min (by C-G formula based on SCr of 0.77 mg/dL). Liver Function Tests:  Recent Labs Lab 03/20/17 1537 03/21/17 0225  AST 70* 40  ALT 12* 21  ALKPHOS 64 53  BILITOT 2.7* 0.5  PROT 6.4* 5.3*  ALBUMIN 3.0* 2.6*   No results for input(s): LIPASE, AMYLASE in the last 168 hours. No results for input(s): AMMONIA in the last 168 hours. Coagulation Profile:  Recent Labs Lab 03/20/17 1955  INR 0.96   Cardiac Enzymes:  Recent Labs Lab 03/20/17 1955 03/21/17 0225 03/21/17 0714  TROPONINI 0.03* <0.03 <0.03   BNP (last 3 results)  Recent Labs  01/13/17 1723  PROBNP 42.0   HbA1C: No results for input(s): HGBA1C in the last 72 hours. CBG: No results for input(s): GLUCAP in the last 168 hours. Lipid Profile: No results for input(s): CHOL, HDL, LDLCALC, TRIG, CHOLHDL, LDLDIRECT in the last 72 hours. Thyroid Function Tests:  Recent Labs  03/21/17 0225  TSH 0.121*   Anemia Panel: No results for input(s): VITAMINB12, FOLATE, FERRITIN, TIBC, IRON, RETICCTPCT in the last 72 hours. Urine analysis:    Component Value Date/Time   COLORURINE YELLOW 03/20/2017 [place] 03/20/2017 1704   LABSPEC 1.025 03/20/2017 1704   PHURINE 6.0 03/20/2017 1704   GLUCOSEU NEGATIVE 03/20/2017 1704   GLUCOSEU  NEGATIVE 12/29/2016 1239   HGBUR NEGATIVE 03/20/2017 1704   BILIRUBINUR NEGATIVE 03/20/2017 1704   KETONESUR NEGATIVE 03/20/2017 1704   PROTEINUR NEGATIVE 03/20/2017 1704   UROBILINOGEN 0.2 12/29/2016 1239   NITRITE NEGATIVE 03/20/2017 1704   LEUKOCYTESUR NEGATIVE 03/20/2017 1704   Sepsis Labs: Invalid input(s): PROCALCITONIN, LACTICIDVEN  Recent Results (from the past 240 hour(s))  Blood Culture (routine x 2)     Status: None (Preliminary result)   Collection Time: 03/20/17  5:04 PM  Result Value Ref Range Status   Specimen Description BLOOD LEFT ANTECUBITAL  Final   Special Requests   Final    BOTTLES DRAWN  AEROBIC AND ANAEROBIC Blood Culture adequate volume   Culture NO GROWTH < 24 HOURS  Final   Report Status PENDING  Incomplete  Blood Culture (routine x 2)     Status: None (Preliminary result)   Collection Time: 03/20/17  5:05 PM  Result Value Ref Range Status   Specimen Description BLOOD RIGHT ANTECUBITAL  Final   Special Requests   Final    BOTTLES DRAWN AEROBIC AND ANAEROBIC Blood Culture adequate volume   Culture NO GROWTH < 24 HOURS  Final   Report Status PENDING  Incomplete  MRSA PCR Screening     Status: None   Collection Time: 03/20/17  9:19 PM  Result Value Ref Range Status   MRSA by PCR NEGATIVE NEGATIVE Final    Comment:        The GeneXpert MRSA Assay (FDA approved for NASAL specimens only), is one component of a comprehensive MRSA colonization surveillance program. It is not intended to diagnose MRSA infection nor to guide or monitor treatment for MRSA infections.       Radiology Studies: Ct Head Wo Contrast  Result Date: 03/20/2017 CLINICAL DATA:  Worsening shortness of breath and increasing weakness. EXAM: CT HEAD WITHOUT CONTRAST TECHNIQUE: Contiguous axial images were obtained from the base of the skull through the vertex without intravenous contrast. COMPARISON:  02/22/2017 FINDINGS: Brain: There is no evidence for acute hemorrhage,  hydrocephalus, or abnormal extra-axial fluid collection. 17 mm right frontal parafalcine densely calcified lesion likely a meningioma, unchanged in the interval. No adjacent mass-effect or edema. No definite CT evidence for acute infarction. Diffuse loss of parenchymal volume is consistent with atrophy. Patchy low attenuation in the deep hemispheric and periventricular white matter is nonspecific, but likely reflects chronic microvascular ischemic demyelination. Vascular: No hyperdense vessel or unexpected calcification. Skull: No evidence for fracture. No worrisome lytic or sclerotic lesion. Sinuses/Orbits: The visualized paranasal sinuses and mastoid air cells are clear. Visualized portions of the globes and intraorbital fat are unremarkable. Other: None. IMPRESSION: 1. Stable exam.  No acute intracranial abnormality. 2. Next item atrophy with chronic small vessel white matter ischemic disease. 3. Stable appearance densely calcified right frontal parafalcine meningioma. Electronically Signed   By: Misty Stanley M.D.   On: 03/20/2017 18:56   Dg Chest Port 1 View  Result Date: 03/20/2017 CLINICAL DATA:  COPD exacerbation. EXAM: PORTABLE CHEST 1 VIEW COMPARISON:  Chest x-ray dated March 13, 2017. FINDINGS: The cardiomediastinal silhouette is normal in size. Normal pulmonary vascularity. Emphysematous changes again noted. No focal consolidation, pleural effusion, or pneumothorax. No acute osseous abnormality. IMPRESSION: COPD.  No active disease. Electronically Signed   By: Titus Dubin M.D.   On: 03/20/2017 15:52     Scheduled Meds: . acidophilus  1 capsule Oral Daily  . aspirin EC  81 mg Oral QHS  . atorvastatin  20 mg Oral QHS  . B-complex with vitamin C  1 tablet Oral QHS  . donepezil  10 mg Oral QHS  . enoxaparin (LOVENOX) injection  30 mg Subcutaneous Q24H  . escitalopram  10 mg Oral Daily  . gabapentin  100 mg Oral TID  . guaiFENesin  600 mg Oral BID  . ipratropium  0.5 mg Nebulization QID   . levalbuterol  1.25 mg Nebulization QID  . methylPREDNISolone (SOLU-MEDROL) injection  60 mg Intravenous TID  . montelukast  10 mg Oral QHS  . roflumilast  500 mcg Oral Daily  . umeclidinium bromide  1 puff Inhalation Daily   Continuous Infusions: .  sodium chloride 125 mL/hr at 03/21/17 0400  . azithromycin Stopped (03/21/17 0028)     Marzetta Board, MD, PhD [facility] Pager [phone]  If 7PM-7AM, please contact night-coverage [email or web address] Password [facility] 03/21/2017, 10:47 AM

## 2017-03-21 NOTE — Evaluation (Signed)
Occupational Therapy Evaluation Patient Details Name: Michele Martin MRN: [id number] DOB: 09/05/1945 Today's Date: 03/21/2017    History of Present Illness Pt was admitted for chronic respiratory failure with hypoxia. She was recently admitted for recent falls due to dyspnea.  PMH:  COPD/asthma, OA, chronic back pain, anxiety, depression, osteopenia and tremor   Clinical Impression   This 71 year old female was admitted for the above. She lives alone at baseline and has intermittent assistance from granddaughter.  She currently needs min A overall for adls. She will benefit from continued OT to increase safety and independence. Goals are for supervision level.    Follow Up Recommendations  Home health OT (likely as long as pt progresses well to manage independently)    Equipment Recommendations  None recommended by OT    Recommendations for Other Services       Precautions / Restrictions Precautions Precautions: Fall Precaution Comments: watch 02 sats Restrictions Weight Bearing Restrictions: No      Mobility Bed Mobility Overal bed mobility: Independent               Transfers Overall transfer level: Needs assistance Equipment used: Rolling walker (2 wheeled) Transfers: Sit to/from Stand Sit to Stand: Min guard         General transfer comment: cues for hand placement and safety    Balance Overall balance assessment: Needs assistance           Standing balance-Leahy Scale: Fair                             ADL either performed or assessed with clinical judgement   ADL Overall ADL's : Needs assistance/impaired     Grooming: Set up;Sitting   Upper Body Bathing: Set up;Sitting   Lower Body Bathing: Min guard assistance;Sit to/from stand   Upper Body Dressing : Set up;Sitting   Lower Body Dressing: Minimal assistance;Sit to/from stand   Toilet Transfer: Minimal assistance;Stand-pivot;BSC;RW   Toileting- Clothing Manipulation  and Hygiene: Minimal assistance;Sit to/from stand         General ADL Comments: Pt performed SPT to 3:1 then stepped backwards to chair once 3:1 was removed.  Decreased balance with backing up and cues to bend forward as she tends to "plop down" without controlling descent. Pt sounded wheezy during session. Sats 100% on 3 liters.  Cued for pursed lip breathing     Vision         Perception     Praxis      Pertinent Vitals/Pain Pain Assessment: No/denies pain     Hand Dominance     Extremity/Trunk Assessment Upper Extremity Assessment Upper Extremity Assessment:  (baseline tremors) general weakness          Communication Communication Communication: No difficulties   Cognition Arousal/Alertness: Awake/alert Behavior During Therapy: WFL for tasks assessed/performed Overall Cognitive Status: Within Functional Limits for tasks assessed                                 General Comments: per chart, mild cognitive impairment   General Comments       Exercises     Shoulder Instructions      Home Living Family/patient expects to be discharged to:: Private residence Living Arrangements: Alone Available Help at Discharge: Friend(s);Family;Available PRN/intermittently Type of Home: House Home Access: Stairs to enter [employer] of Steps: 2 +  3 Entrance Stairs-Rails: Can reach both Home Layout: One level               Home Equipment: Grab bars - toilet;Grab bars - tub/shower;Shower seat   Additional Comments:  she enjoys walking as much as possible when feeling well and taking care of her dog when she is not ill      Prior Functioning/Environment Level of Independence: Independent;Independent with assistive device(s)        Comments: drives, uses O2 22/6        OT Problem List: Decreased strength;Decreased activity tolerance;Impaired balance (sitting and/or standing);Cardiopulmonary status limiting activity;Decreased knowledge  of use of DME or AE      OT Treatment/Interventions: Self-care/ADL training;DME and/or AE instruction;Patient/family education;Balance training;Energy conservation    OT Goals(Current goals can be found in the care plan section) Acute Rehab OT Goals Patient Stated Goal: get better, be able to walk and breathe OT Goal Formulation: With patient Time For Goal Achievement: 03/28/17 Potential to Achieve Goals: Good ADL Goals Pt Will Transfer to Toilet: with supervision;ambulating;bedside commode Pt Will Perform Toileting - Clothing Manipulation and hygiene: with supervision;sit to/from stand Additional ADL Goal #1: pt will gather clothes at supervision level and complete adl without assistance, initiating at least one rest break for energy conservation  OT Frequency: Min 2X/week   Barriers to D/C:            Co-evaluation              AM-PAC PT "6 Clicks" Daily Activity     Outcome Measure Help from another person eating meals?: None Help from another person taking care of personal grooming?: A Little Help from another person toileting, which includes using toliet, bedpan, or urinal?: A Little Help from another person bathing (including washing, rinsing, drying)?: A Little Help from another person to put on and taking off regular upper body clothing?: A Little Help from another person to put on and taking off regular lower body clothing?: A Little 6 Click Score: 19   End of Session    Activity Tolerance: Patient limited by fatigue Patient left: in chair;with call bell/phone within reach  OT Visit Diagnosis: Unsteadiness on feet (R26.81)                Time: 3335-4562 OT Time Calculation (min): 23 min Charges:  OT General Charges $OT Visit: 1 Procedure OT Evaluation $OT Eval Low Complexity: 1 Procedure G-Codes:     [place], OTR/L [phone] 03/21/2017  Dionel Archey 03/21/2017, 3:12 PM

## 2017-03-21 NOTE — Progress Notes (Signed)
PHARMACY - PHYSICIAN COMMUNICATION CRITICAL VALUE ALERT - BLOOD CULTURE IDENTIFICATION (BCID)  Results for orders placed or performed during the hospital encounter of 03/20/17  Blood Culture ID Panel (Reflexed) (Collected: 03/20/2017  5:04 PM)  Result Value Ref Range   Enterococcus species NOT DETECTED NOT DETECTED   Listeria monocytogenes NOT DETECTED NOT DETECTED   Staphylococcus species DETECTED (A) NOT DETECTED   Staphylococcus aureus NOT DETECTED NOT DETECTED   Methicillin resistance NOT DETECTED NOT DETECTED   Streptococcus species NOT DETECTED NOT DETECTED   Streptococcus agalactiae NOT DETECTED NOT DETECTED   Streptococcus pneumoniae NOT DETECTED NOT DETECTED   Streptococcus pyogenes NOT DETECTED NOT DETECTED   Acinetobacter baumannii NOT DETECTED NOT DETECTED   Enterobacteriaceae species NOT DETECTED NOT DETECTED   Enterobacter cloacae complex NOT DETECTED NOT DETECTED   Escherichia coli NOT DETECTED NOT DETECTED   Klebsiella oxytoca NOT DETECTED NOT DETECTED   Klebsiella pneumoniae NOT DETECTED NOT DETECTED   Proteus species NOT DETECTED NOT DETECTED   Serratia marcescens NOT DETECTED NOT DETECTED   Haemophilus influenzae NOT DETECTED NOT DETECTED   Neisseria meningitidis NOT DETECTED NOT DETECTED   Pseudomonas aeruginosa NOT DETECTED NOT DETECTED   Candida albicans NOT DETECTED NOT DETECTED   Candida glabrata NOT DETECTED NOT DETECTED   Candida krusei NOT DETECTED NOT DETECTED   Candida parapsilosis NOT DETECTED NOT DETECTED   Candida tropicalis NOT DETECTED NOT DETECTED    Name of physician (or Provider) Contacted: Dr. Cruzita Lederer  Changes to prescribed antibiotics required: No  Lolita Patella 03/21/2017  5:32 PM

## 2017-03-22 ENCOUNTER — Inpatient Hospital Stay (HOSPITAL_COMMUNITY): Payer: Medicare Other

## 2017-03-22 LAB — BLOOD GAS, ARTERIAL
ACID-BASE EXCESS: 4.2 mmol/L — AB (ref 0.0–2.0)
Acid-Base Excess: 1.3 mmol/L (ref 0.0–2.0)
BICARBONATE: 28.8 mmol/L — AB (ref 20.0–28.0)
BICARBONATE: 28.8 mmol/L — AB (ref 20.0–28.0)
DRAWN BY: 270211
DRAWN BY: 270211
Delivery systems: POSITIVE
EXPIRATORY PAP: 6
FIO2: 0.28
Inspiratory PAP: 14
Mode: POSITIVE
O2 CONTENT: 3 L/min
O2 Saturation: 98.6 %
O2 Saturation: 98.6 %
PATIENT TEMPERATURE: 37
PCO2 ART: 63 mmHg — AB (ref 32.0–48.0)
PH ART: 7.282 — AB (ref 7.350–7.450)
PH ART: 7.417 (ref 7.350–7.450)
PO2 ART: 116 mmHg — AB (ref 83.0–108.0)
Patient temperature: 37
pCO2 arterial: 45.5 mmHg (ref 32.0–48.0)
pO2, Arterial: 134 mmHg — ABNORMAL HIGH (ref 83.0–108.0)

## 2017-03-22 LAB — BASIC METABOLIC PANEL
Anion gap: 4 — ABNORMAL LOW (ref 5–15)
BUN: 13 mg/dL (ref 6–20)
CHLORIDE: 104 mmol/L (ref 101–111)
CO2: 29 mmol/L (ref 22–32)
Calcium: 7.9 mg/dL — ABNORMAL LOW (ref 8.9–10.3)
Creatinine, Ser: 0.45 mg/dL (ref 0.44–1.00)
GFR calc non Af Amer: >60 mL/min (ref >60)
Glucose, Bld: 123 mg/dL — ABNORMAL HIGH (ref 65–99)
POTASSIUM: 4.6 mmol/L (ref 3.5–5.1)
SODIUM: 137 mmol/L (ref 135–145)

## 2017-03-22 LAB — CBC
HEMATOCRIT: 30.1 % — AB (ref 36.0–46.0)
Hemoglobin: 10.1 g/dL — ABNORMAL LOW (ref 12.0–15.0)
MCH: 32.4 pg (ref 26.0–34.0)
MCHC: 33.6 g/dL (ref 30.0–36.0)
MCV: 96.5 fL (ref 78.0–100.0)
Platelets: 233 10*3/uL (ref 150–400)
RBC: 3.12 MIL/uL — ABNORMAL LOW (ref 3.87–5.11)
RDW: 13 % (ref 11.5–15.5)
WBC: 18.6 10*3/uL — ABNORMAL HIGH (ref 4.0–10.5)

## 2017-03-22 LAB — URINE CULTURE: Culture: NO GROWTH

## 2017-03-22 MED ORDER — ENOXAPARIN SODIUM 40 MG/0.4ML ~~LOC~~ SOLN
40.0000 mg | SUBCUTANEOUS | Status: DC
  Administered 2017-03-22 – 2017-03-25 (×4): 40 mg via SUBCUTANEOUS
  Filled 2017-03-22 (×4): qty 0.4

## 2017-03-22 NOTE — Progress Notes (Signed)
PROGRESS NOTE  Michele Martin:[id number] DOB: 01/22/1946 DOA: 03/20/2017 PCP: Biagio Borg, MD   LOS: 2 days   Brief Narrative / Interim history: Michele Martin is a 71 y.o. female with medical history significant of mild cognitive impairment , hyperlipidemia, COPD, asthma, depression, anxiety, tremor, dCHF, who presents with shortness of breath.  She was admitted to stepdown with COPD exacerbation.  She improved and was transferred to the floor on 8/25, however on 8/26 AM she was found to be in significant respiratory distress requiring transfer back to stepdown  Assessment & Plan: Principal Problem:   Acute on chronic respiratory failure with hypoxia and hypercapnia (HCC) Active Problems:   Anxiety and depression   COPD exacerbation (HCC)   Mild cognitive impairment   Hyperlipidemia   SIRS (systemic inflammatory response syndrome) (HCC)   Chronic diastolic CHF (congestive heart failure) (HCC)   Acute metabolic encephalopathy   Hypochloremic alkalosis   Hyponatremia  Acute on chronic hypoxic and hypercarbic respiratory failure -Patient with acute decompensation of her respiratory status this morning, found to be in significant distress tachypneic, wheezing and using accessory muscles.  Stat chest x-ray without acute findings, without pulmonary edema.  Stat ABG showed acute respiratory acidosis.  Transfer patient to stepdown, placed on BiPAP.  Will closely monitor today.  She confirms status of full code.  We will give her a few hours of BiPAP, repeat ABG in the afternoon, if no improvement on BiPAP will need critical care consultation  COPD exacerbation -with SIRS likely due to respiratory distress rather than true sepsis -Continue steroids, continue levalbuterol/ipratropium -Continue Pulmicort -Lactic acid improved -Blood pressure responded to fluids, discontinue fluids today -Respiratory virus panel negative -Continue doxycycline -Blood cultures 1 out of 2 bottles  with coagulase-negative staph, likely contaminant  Hyponatremia -Likely due to decreased oral intake and diuretics -Hold home diuretics, hyponatremia resolved  Hypokalemia -Likely due to home diuretics and poor p.o. intake -Within normal limits this morning  Chronic diastolic CHF -2D echo in July 2018 has an EF of 55-60% with grade 1 diastolic dysfunction.  Currently she appears euvolemic  Acute encephalopathy -Due to COPD exacerbation and hypoxic and hypercarbic respiratory failure.  Resolved  Mild cognitive impairment -Oriented oriented 4, functional at home, continue donepezil  Anxiety depression -Continue Lexapro  Hyperlipidemia -Continue Lipitor   DVT prophylaxis: Lovenox Code Status: Full code Family Communication: No family at bedside Disposition Plan: Transfer to SDU  Consultants:   None  Procedures:   None   Antimicrobials:  Azithromycin 8/24 >> 8/25  Doxycycline 8/25 >>  Subjective: -Patient was found to be in distress, trying to catch her breath, tachypneic, using accessory muscles.  She is complaining of shortness of breath which started this morning.  She denies any chest pain, she denies any abdominal pain, nausea or vomiting  Objective: Vitals:   03/21/17 1511 03/21/17 2051 03/22/17 0552 03/22/17 0752  BP:  (!) 100/48 111/62   Pulse:  83 77   Resp:  16 18   Temp:  99.9 F (37.7 C) 98 F (36.7 C)   TempSrc:  Oral Oral   SpO2: 99% 99% 100% 98%  Weight:      Height:        Intake/Output Summary (Last 24 hours) at 03/22/17 1120 Last data filed at 03/22/17 0831  Gross per 24 hour  Intake             4240 ml  Output  8 ml  Net             4232 ml   Filed Weights   03/20/17 1439 03/21/17 0411  Weight: 45.4 kg (100 lb) 52 kg (114 lb 10.2 oz)    Examination:  Vitals:   03/21/17 1511 03/21/17 2051 03/22/17 0552 03/22/17 0752  BP:  (!) 100/48 111/62   Pulse:  83 77   Resp:  16 18   Temp:  99.9 F (37.7 C) 98 F  (36.7 C)   TempSrc:  Oral Oral   SpO2: 99% 99% 100% 98%  Weight:      Height:        Constitutional: Appears anxious and in distress Eyes: lids and conjunctivae normal ENMT: Mucous membranes are moist.  Neck: normal, supple Respiratory: Increased respiratory effort, diffuse bilateral wheezing, does not appear to move air very well, increased accessory muscle use Cardiovascular: Regular rate and rhythm, no murmurs / rubs / gallops. No LE edema. 2+ pedal pulses.  Abdomen: no tenderness. Bowel sounds positive.  Skin: no rashes, lesions, ulcers. No induration Neurologic: non focal   Data Reviewed: I have independently reviewed following labs and imaging studies   CBC:  Recent Labs Lab 03/20/17 1537 03/21/17 0225 03/22/17 0523  WBC 10.8* 6.8 18.6*  NEUTROABS 9.6*  --   --   HGB 13.7 10.5* 10.1*  HCT 37.8 29.4* 30.1*  MCV 92.6 93.3 96.5  PLT 271 211 124   Basic Metabolic Panel:  Recent Labs Lab 03/20/17 1537 03/20/17 1955 03/20/17 2228 03/21/17 0225 03/22/17 0523  NA 127* 129*  --  131* 137  K 3.5 <2.0* <2.0* 2.6* 4.6  CL <65* 75*  --  87* 104  CO2 50* 40*  --  31 29  GLUCOSE 159* 294*  --  193* 123*  BUN 16 17  --  17 13  CREATININE 1.00 0.96  --  0.77 0.45  CALCIUM 8.7* 8.0*  --  7.6* 7.9*  MG  --   --  1.3*  --   --    GFR: Estimated Creatinine Clearance: 48 mL/min (by C-G formula based on SCr of 0.45 mg/dL). Liver Function Tests:  Recent Labs Lab 03/20/17 1537 03/21/17 0225  AST 70* 40  ALT 12* 21  ALKPHOS 64 53  BILITOT 2.7* 0.5  PROT 6.4* 5.3*  ALBUMIN 3.0* 2.6*   No results for input(s): LIPASE, AMYLASE in the last 168 hours. No results for input(s): AMMONIA in the last 168 hours. Coagulation Profile:  Recent Labs Lab 03/20/17 1955  INR 0.96   Cardiac Enzymes:  Recent Labs Lab 03/20/17 1955 03/21/17 0225 03/21/17 0714  TROPONINI 0.03* <0.03 <0.03   BNP (last 3 results)  Recent Labs  01/13/17 1723  PROBNP 42.0   HbA1C: No  results for input(s): HGBA1C in the last 72 hours. CBG: No results for input(s): GLUCAP in the last 168 hours. Lipid Profile: No results for input(s): CHOL, HDL, LDLCALC, TRIG, CHOLHDL, LDLDIRECT in the last 72 hours. Thyroid Function Tests:  Recent Labs  03/21/17 0225  TSH 0.121*   Anemia Panel: No results for input(s): VITAMINB12, FOLATE, FERRITIN, TIBC, IRON, RETICCTPCT in the last 72 hours. Urine analysis:    Component Value Date/Time   COLORURINE YELLOW 03/20/2017 [place] 03/20/2017 1704   LABSPEC 1.025 03/20/2017 1704   PHURINE 6.0 03/20/2017 1704   GLUCOSEU NEGATIVE 03/20/2017 1704   GLUCOSEU NEGATIVE 12/29/2016 1239   HGBUR NEGATIVE 03/20/2017 1704   BILIRUBINUR NEGATIVE  03/20/2017 [place] 03/20/2017 1704   PROTEINUR NEGATIVE 03/20/2017 1704   UROBILINOGEN 0.2 12/29/2016 1239   NITRITE NEGATIVE 03/20/2017 1704   LEUKOCYTESUR NEGATIVE 03/20/2017 1704   Sepsis Labs: Invalid input(s): PROCALCITONIN, LACTICIDVEN  Recent Results (from the past 240 hour(s))  Urine Culture     Status: None   Collection Time: 03/20/17  5:04 PM  Result Value Ref Range Status   Specimen Description URINE, CLEAN CATCH  Final   Special Requests NONE  Final   Culture   Final    NO GROWTH Performed at [facility], [address], [place], [place] [address]    Report Status 03/22/2017 FINAL  Final  Blood Culture (routine x 2)     Status: None (Preliminary result)   Collection Time: 03/20/17  5:04 PM  Result Value Ref Range Status   Specimen Description BLOOD LEFT ANTECUBITAL  Final   Special Requests   Final    BOTTLES DRAWN AEROBIC AND ANAEROBIC Blood Culture adequate volume   Culture  Setup Time   Final    GRAM POSITIVE COCCI AEROBIC BOTTLE ONLY CRITICAL RESULT CALLED TO, READ BACK BY AND VERIFIED WITH: PHARMD T PICKERING [phone] [id number] MLM    Culture   Final    GRAM POSITIVE COCCI CULTURE REINCUBATED FOR BETTER GROWTH Performed at [facility], [place] [address], [place], [place] [address]    Report Status PENDING  Incomplete  Blood Culture ID Panel (Reflexed)     Status: Abnormal   Collection Time: 03/20/17  5:04 PM  Result Value Ref Range Status   Enterococcus species NOT DETECTED NOT DETECTED Final   Listeria monocytogenes NOT DETECTED NOT DETECTED Final   Staphylococcus species DETECTED (A) NOT DETECTED Final    Comment: Methicillin (oxacillin) susceptible coagulase negative staphylococcus. Possible blood culture contaminant (unless isolated from more than one blood culture draw or clinical case suggests pathogenicity). No antibiotic treatment is indicated for blood  culture contaminants. CRITICAL RESULT CALLED TO, READ BACK BY AND VERIFIED WITH: PHARMD T PICKERING [phone] MLM    Staphylococcus aureus NOT DETECTED NOT DETECTED Final   Methicillin resistance NOT DETECTED NOT DETECTED Final   Streptococcus species NOT DETECTED NOT DETECTED Final   Streptococcus agalactiae NOT DETECTED NOT DETECTED Final   Streptococcus pneumoniae NOT DETECTED NOT DETECTED Final   Streptococcus pyogenes NOT DETECTED NOT DETECTED Final   Acinetobacter baumannii NOT DETECTED NOT DETECTED Final   Enterobacteriaceae species NOT DETECTED NOT DETECTED Final   Enterobacter cloacae complex NOT DETECTED NOT DETECTED Final   Escherichia coli NOT DETECTED NOT DETECTED Final   Klebsiella oxytoca NOT DETECTED NOT DETECTED Final   Klebsiella pneumoniae NOT DETECTED NOT DETECTED Final   Proteus species NOT DETECTED NOT DETECTED Final   Serratia marcescens NOT DETECTED NOT DETECTED Final   Haemophilus influenzae NOT DETECTED NOT DETECTED Final   Neisseria meningitidis NOT DETECTED NOT DETECTED Final   Pseudomonas aeruginosa NOT DETECTED NOT DETECTED Final   Candida albicans NOT DETECTED NOT DETECTED Final   Candida glabrata NOT DETECTED NOT DETECTED Final   Candida krusei NOT DETECTED NOT DETECTED Final   Candida parapsilosis NOT DETECTED  NOT DETECTED Final   Candida tropicalis NOT DETECTED NOT DETECTED Final    Comment: Performed at [facility], [address] [place], [place] [address]  Blood Culture (routine x 2)     Status: None (Preliminary result)   Collection Time: 03/20/17  5:05 PM  Result Value  Ref Range Status   Specimen Description BLOOD RIGHT ANTECUBITAL  Final   Special Requests   Final    BOTTLES DRAWN AEROBIC AND ANAEROBIC Blood Culture adequate volume   Culture   Final    NO GROWTH 2 DAYS Performed at [facility], [address], [place], [place] [address]    Report Status PENDING  Incomplete  MRSA PCR Screening     Status: None   Collection Time: 03/20/17  9:19 PM  Result Value Ref Range Status   MRSA by PCR NEGATIVE NEGATIVE Final    Comment:        The GeneXpert MRSA Assay (FDA approved for NASAL specimens only), is one component of a comprehensive MRSA colonization surveillance program. It is not intended to diagnose MRSA infection nor to guide or monitor treatment for MRSA infections.   Respiratory Panel by PCR     Status: None   Collection Time: 03/21/17  7:06 AM  Result Value Ref Range Status   Adenovirus NOT DETECTED NOT DETECTED Final   Coronavirus 229E NOT DETECTED NOT DETECTED Final   Coronavirus HKU1 NOT DETECTED NOT DETECTED Final   Coronavirus NL63 NOT DETECTED NOT DETECTED Final   Coronavirus OC43 NOT DETECTED NOT DETECTED Final   Metapneumovirus NOT DETECTED NOT DETECTED Final   Rhinovirus / Enterovirus NOT DETECTED NOT DETECTED Final   Influenza A NOT DETECTED NOT DETECTED Final   Influenza B NOT DETECTED NOT DETECTED Final   Parainfluenza Virus 1 NOT DETECTED NOT DETECTED Final   Parainfluenza Virus 2 NOT DETECTED NOT DETECTED Final   Parainfluenza Virus 3 NOT DETECTED NOT DETECTED Final   Parainfluenza Virus 4 NOT DETECTED NOT DETECTED Final   Respiratory Syncytial Virus NOT DETECTED NOT DETECTED Final   Bordetella pertussis NOT DETECTED NOT DETECTED Final    Chlamydophila pneumoniae NOT DETECTED NOT DETECTED Final   Mycoplasma pneumoniae NOT DETECTED NOT DETECTED Final    Comment: Performed at [facility], [place] [address] [place], [place] [address]  C difficile quick scan w PCR reflex     Status: None   Collection Time: 03/21/17  2:23 PM  Result Value Ref Range Status   C Diff antigen NEGATIVE NEGATIVE Final   C Diff toxin NEGATIVE NEGATIVE Final   C Diff interpretation No C. difficile detected.  Final      Radiology Studies: Ct Head Wo Contrast  Result Date: 03/20/2017 CLINICAL DATA:  Worsening shortness of breath and increasing weakness. EXAM: CT HEAD WITHOUT CONTRAST TECHNIQUE: Contiguous axial images were obtained from the base of the skull through the vertex without intravenous contrast. COMPARISON:  02/22/2017 FINDINGS: Brain: There is no evidence for acute hemorrhage, hydrocephalus, or abnormal extra-axial fluid collection. 17 mm right frontal parafalcine densely calcified lesion likely a meningioma, unchanged in the interval. No adjacent mass-effect or edema. No definite CT evidence for acute infarction. Diffuse loss of parenchymal volume is consistent with atrophy. Patchy low attenuation in the deep hemispheric and periventricular white matter is nonspecific, but likely reflects chronic microvascular ischemic demyelination. Vascular: No hyperdense vessel or unexpected calcification. Skull: No evidence for fracture. No worrisome lytic or sclerotic lesion. Sinuses/Orbits: The visualized paranasal sinuses and mastoid air cells are clear. Visualized portions of the globes and intraorbital fat are unremarkable. Other: None. IMPRESSION: 1. Stable exam.  No acute intracranial abnormality. 2. Next item atrophy with chronic small vessel white matter ischemic disease. 3. Stable appearance densely calcified right frontal parafalcine meningioma. Electronically Signed   By: Randall Hiss  Tery Sanfilippo M.D.   On: 03/20/2017 18:56   Dg Chest Port 1 View  Result  Date: 03/22/2017 CLINICAL DATA:  Dyspnea for 1 day.  History of asthma. EXAM: PORTABLE CHEST 1 VIEW COMPARISON:  03/20/2017 FINDINGS: Lucency in the upper lungs is similar to the previous examination. No focal airspace disease or pulmonary edema. Slightly prominent lung markings appear chronic. Heart size is normal. Atherosclerotic calcifications at the aortic arch. Surgical plate in the cervical spine. IMPRESSION: No active disease. Electronically Signed   By: Markus Daft M.D.   On: 03/22/2017 09:27   Dg Chest Port 1 View  Result Date: 03/20/2017 CLINICAL DATA:  COPD exacerbation. EXAM: PORTABLE CHEST 1 VIEW COMPARISON:  Chest x-ray dated March 13, 2017. FINDINGS: The cardiomediastinal silhouette is normal in size. Normal pulmonary vascularity. Emphysematous changes again noted. No focal consolidation, pleural effusion, or pneumothorax. No acute osseous abnormality. IMPRESSION: COPD.  No active disease. Electronically Signed   By: Titus Dubin M.D.   On: 03/20/2017 15:52     Scheduled Meds: . acidophilus  1 capsule Oral Daily  . aspirin EC  81 mg Oral QHS  . atorvastatin  20 mg Oral QHS  . B-complex with vitamin C  1 tablet Oral QHS  . budesonide (PULMICORT) nebulizer solution  0.25 mg Nebulization BID  . donepezil  10 mg Oral QHS  . enoxaparin (LOVENOX) injection  40 mg Subcutaneous Q24H  . escitalopram  10 mg Oral Daily  . gabapentin  100 mg Oral TID  . guaiFENesin  600 mg Oral BID  . ipratropium  0.5 mg Nebulization QID  . levalbuterol  1.25 mg Nebulization QID  . methylPREDNISolone (SOLU-MEDROL) injection  60 mg Intravenous TID  . montelukast  10 mg Oral QHS  . roflumilast  500 mcg Oral Daily  . umeclidinium bromide  1 puff Inhalation Daily   Continuous Infusions: . sodium chloride 125 mL/hr (03/22/17 0302)  . doxycycline (VIBRAMYCIN) IV Stopped (03/22/17 1031)     Marzetta Board, MD, PhD [facility] Pager [phone]  If 7PM-7AM, please contact  night-coverage [email or web address] Password [email or web address] 03/22/2017, 11:20 AM

## 2017-03-22 NOTE — Progress Notes (Signed)
Peripherally Inserted Central Catheter/Midline Placement  The IV Nurse has discussed with the patient and/or persons authorized to consent for the patient, the purpose of this procedure and the potential benefits and risks involved with this procedure.  The benefits include less needle sticks, lab draws from the catheter, and the patient may be discharged home with the catheter. Risks include, but not limited to, infection, bleeding, blood clot (thrombus formation), and puncture of an artery; nerve damage and irregular heartbeat and possibility to perform a PICC exchange if needed/ordered by physician.  Alternatives to this procedure were also discussed.  Bard Power PICC patient education guide, fact sheet on infection prevention and patient information card has been provided to patient /or left at bedside.   Assessed BUE for PICC placement.  Unable to locate suitable vein. RA basilic vein measures 50 % occupancy. Unable to measure brachial, unable to locate cephalic on RA.  LUE cephalic measures 767% occupancy, basilic measures 341% occupancy, Brachial measures 77% occcupancy.  Unable to place PICC.  Was able to obtain PIV with [place] , 2 IVT RN's attempted x 3.  Please consider alternative access.  PICC/Midline Placement Documentation        Michele Martin 03/22/2017, 2:54 PM

## 2017-03-22 NOTE — Progress Notes (Signed)
Initial Nutrition Assessment  DOCUMENTATION CODES:   Non-severe (moderate) malnutrition in context of chronic illness  INTERVENTION:   Diet advancement per MD When diet advanced, provide Ensure Enlive po BID, each supplement provides 350 kcal and 20 grams of protein  RD will continue to monitor plan  NUTRITION DIAGNOSIS:   Malnutrition(moderate) related to chronic illness (COPD) as evidenced by mild depletion of body fat, mild depletion of muscle mass.  GOAL:   Patient will meet greater than or equal to 90% of their needs  MONITOR:   Diet advancement, Labs, Weight trends, Skin, I & O's  REASON FOR ASSESSMENT:   Consult COPD Protocol  ASSESSMENT:   71 y.o. female with medical history significant of mild cognitive impairment , hyperlipidemia, COPD, asthma, depression, anxiety, tremor, dCHF, who presents with shortness of breath.  She was admitted to stepdown with COPD exacerbation.  She improved and was transferred to the floor on 8/25, however on 8/26 AM she was found to be in significant respiratory distress requiring transfer back to stepdown  Patient transferred to stepdown unit today d/t respiratory distress. Currently NPO. PICC placement unsuccessful today. Pt did eat 75% of a meal yesterday prior to move (~545 kcal, 20g protein).  Pt would benefit from nutrition supplement once diet is advanced. Will order Ensure at that time.  Per chart review, pt has had some weight gain recently. Nutrition-Focused physical exam completed. Findings are mild fat depletion, mild muscle depletion, and no edema.   Labs reviewed. Medications: B-complex with Vitamin C tablet daily  Diet Order:  Diet NPO time specified  Skin:   (non-pressure wound on arm)  Last BM:  8/26  Height:   Ht Readings from Last 1 Encounters:  03/20/17 5\' 1"  (1.549 m)    Weight:   Wt Readings from Last 1 Encounters:  03/21/17 114 lb 10.2 oz (52 kg)    Ideal Body Weight:  47.7 kg  BMI:  Body mass  index is 21.66 kg/m.  Estimated Nutritional Needs:   Kcal:  1550-1750  Protein:  70-80g  Fluid:  1.7L/day  EDUCATION NEEDS:   No education needs identified at this time  Clayton Bibles, MS, RD, LDN Pager: [phone] After Hours Pager: [phone]

## 2017-03-23 ENCOUNTER — Ambulatory Visit: Payer: Self-pay

## 2017-03-23 ENCOUNTER — Encounter: Payer: Self-pay | Admitting: Cardiology

## 2017-03-23 LAB — CBC
HEMATOCRIT: 30.2 % — AB (ref 36.0–46.0)
Hemoglobin: 10.1 g/dL — ABNORMAL LOW (ref 12.0–15.0)
MCH: 32.6 pg (ref 26.0–34.0)
MCHC: 33.4 g/dL (ref 30.0–36.0)
MCV: 97.4 fL (ref 78.0–100.0)
PLATELETS: 219 10*3/uL (ref 150–400)
RBC: 3.1 MIL/uL — ABNORMAL LOW (ref 3.87–5.11)
RDW: 13 % (ref 11.5–15.5)
WBC: 14.5 10*3/uL — AB (ref 4.0–10.5)

## 2017-03-23 LAB — BASIC METABOLIC PANEL
Anion gap: 6 (ref 5–15)
BUN: 17 mg/dL (ref 6–20)
CHLORIDE: 103 mmol/L (ref 101–111)
CO2: 29 mmol/L (ref 22–32)
CREATININE: 0.43 mg/dL — AB (ref 0.44–1.00)
Calcium: 8.5 mg/dL — ABNORMAL LOW (ref 8.9–10.3)
GFR calc Af Amer: >60 mL/min (ref >60)
GFR calc non Af Amer: >60 mL/min (ref >60)
GLUCOSE: 128 mg/dL — AB (ref 65–99)
POTASSIUM: 4.6 mmol/L (ref 3.5–5.1)
Sodium: 138 mmol/L (ref 135–145)

## 2017-03-23 LAB — CULTURE, BLOOD (ROUTINE X 2): Special Requests: ADEQUATE

## 2017-03-23 MED ORDER — CHLORHEXIDINE GLUCONATE 0.12 % MT SOLN
15.0000 mL | Freq: Two times a day (BID) | OROMUCOSAL | Status: DC
  Administered 2017-03-23 – 2017-03-26 (×6): 15 mL via OROMUCOSAL
  Filled 2017-03-23 (×4): qty 15

## 2017-03-23 MED ORDER — ORAL CARE MOUTH RINSE
15.0000 mL | Freq: Two times a day (BID) | OROMUCOSAL | Status: DC
  Administered 2017-03-23 – 2017-03-25 (×4): 15 mL via OROMUCOSAL

## 2017-03-23 NOTE — Care Management Note (Signed)
Case Management Note  Patient Details  Name: Michele Martin MRN: [id number] Date of Birth: 1946-02-26  Subjective/Objective:                  Resp. Failure requiring bipap  Action/Plan: Date:  March 23, 2017 Chart reviewed for concurrent status and case management needs. Will continue to follow patient progress. Discharge Planning: following for needs Expected discharge date: [id number] Michele Martin, BSN, [place], [place]  Expected Discharge Date:   (unknown)               Expected Discharge Plan:  Home/Self Care  In-House Referral:     Discharge planning Services  CM Consult  Post Acute Care Choice:    Choice offered to:     DME Arranged:    DME Agency:     HH Arranged:    HH Agency:     Status of Service:  In process, will continue to follow  If discussed at Long Length of Stay Meetings, dates discussed:    Additional Comments:  Leeroy Cha, RN 03/23/2017, 7:58 AM

## 2017-03-23 NOTE — Progress Notes (Signed)
Physical Therapy Treatment Patient Details Name: Michele Martin MRN: [id number] DOB: [birth year] Today's Date: 03/23/2017    History of Present Illness Michele Martin is a 71 y.o. female was admitted with  history of asthma/COPD and chronic respiratory failure as well as angle closure glaucoma, allergic rhinitis, sinusitis, osteoarthritis, chronic back pain, anxiety/depression, hyperlipidemia, osteopenia, and tremor who presented to the ED via EMS for multiple falls in the setting of worsening dyspnea    PT Comments    Pt tolerated increased ambulation distance of 60' with RW; SaO2 94-95% on 2-3L O2. Distance limited by fatigue, 3/4 dyspnea.   Follow Up Recommendations  Home health PT;Supervision - Intermittent     Equipment Recommendations  None recommended by PT    Recommendations for Other Services       Precautions / Restrictions Precautions Precautions: Fall Precaution Comments: watch 02 sats Restrictions Weight Bearing Restrictions: No    Mobility  Bed Mobility Overal bed mobility: Modified Independent             General bed mobility comments: HOB up, used rail  Transfers Overall transfer level: Needs assistance Equipment used: Rolling walker (2 wheeled) Transfers: Sit to/from Stand Sit to Stand: Min guard         General transfer comment: cues for hand placement and safety  Ambulation/Gait   Ambulation Distance (Feet): 60 Feet Assistive device: Rolling walker (2 wheeled) Gait Pattern/deviations: Step-through pattern;Decreased stride length;Trunk flexed;Drifts right/left   Gait velocity interpretation: Below normal speed for age/gender General Gait Details:     assist to maneuver RW, cues for pace and posture; O2 sats 94-95% on 2-3 L throughout, 83% on RA, distance limited by fatigue, 3/4 dyspnea   Stairs            Wheelchair Mobility    Modified Rankin (Stroke Patients Only)       Balance Overall balance assessment: Needs  assistance   Sitting balance-Leahy Scale: Good       Standing balance-Leahy Scale: Fair                              Cognition Arousal/Alertness: Awake/alert Behavior During Therapy: WFL for tasks assessed/performed Overall Cognitive Status: Within Functional Limits for tasks assessed                                        Exercises      General Comments        Pertinent Vitals/Pain Pain Assessment: No/denies pain    Home Living                      Prior Function            PT Goals (current goals can now be found in the care plan section) Acute Rehab PT Goals Patient Stated Goal: get better, be able to walk and breathe, water her flowers PT Goal Formulation: With patient Time For Goal Achievement: 04/03/17 Progress towards PT goals: Progressing toward goals    Frequency    Min 3X/week      PT Plan Current plan remains appropriate    Co-evaluation              AM-PAC PT "6 Clicks" Daily Activity  Outcome Measure  Difficulty turning over in bed (including adjusting bedclothes, sheets and blankets)?: A Little Difficulty  moving from lying on back to sitting on the side of the bed? : A Little Difficulty sitting down on and standing up from a chair with arms (e.g., wheelchair, bedside commode, etc,.)?: A Little Help needed moving to and from a bed to chair (including a wheelchair)?: A Little Help needed walking in hospital room?: A Little Help needed climbing 3-5 steps with a railing? : A Little 6 Click Score: 18    End of Session Equipment Utilized During Treatment: Gait belt;Oxygen Activity Tolerance: Patient limited by fatigue Patient left: with call bell/phone within reach;in bed;with nursing/sitter in room Nurse Communication: Mobility status PT Visit Diagnosis: Difficulty in walking, not elsewhere classified (R26.2)     Time: 0488-8916 PT Time Calculation (min) (ACUTE ONLY): 24 min  Charges:  $Gait  Training: 8-22 mins $Therapeutic Activity: 8-22 mins                    G Codes:          Michele Martin 03/23/2017, 11:27 AM [phone]

## 2017-03-23 NOTE — Progress Notes (Signed)
PROGRESS NOTE  Michele Martin NKN:[id number] DOB: 1946/01/10 DOA: 03/20/2017 PCP: Biagio Borg, MD   LOS: 3 days   Brief Narrative / Interim history: Michele Martin is a 71 y.o. female with medical history significant of mild cognitive impairment , hyperlipidemia, COPD, asthma, depression, anxiety, tremor, dCHF, who presents with shortness of breath.  She was admitted to stepdown with COPD exacerbation.    -she improved and was transferred to the floor on 8/25 -8/26 AM she was found to be in significant respiratory distress requiring transfer back to stepdown -8/27 is improved, did require BiPAP overnight however is on nasal cannula in the morning   Assessment & Plan: Principal Problem:   Acute on chronic respiratory failure with hypoxia and hypercapnia (HCC) Active Problems:   Anxiety and depression   COPD exacerbation (HCC)   Mild cognitive impairment   Hyperlipidemia   SIRS (systemic inflammatory response syndrome) (HCC)   Chronic diastolic CHF (congestive heart failure) (HCC)   Acute metabolic encephalopathy   Hypochloremic alkalosis   Hyponatremia  Acute on chronic hypoxic and hypercarbic respiratory failure -Initially improved, however on 8/26 was found to be in significant respiratory distress, her ABG showed acute respiratory acidosis requiring BiPAP and transferred to stepdown.  On BiPAP several hours later ABG has normalized and it has been discontinued.  Overnight she did complain of shortness of breath requiring BiPAP to be placed again.  She is on nasal cannula this morning, feels a little bit better -Continue to monitor and stepdown, use BiPAP as needed  COPD exacerbation -with SIRS likely due to respiratory distress rather than true sepsis -Continue steroids, continue levalbuterol/ipratropium -Continue Pulmicort -Lactic acid improved -Blood pressure responded to fluids, now keep off of fluids to prevent fluid overload -Respiratory virus panel  negative -Continue doxycycline -Blood cultures 1 out of 2 bottles with coagulase-negative staph, likely contaminant  Hyponatremia -Likely due to decreased oral intake and diuretics -Hold home diuretics, hyponatremia resolved  Hypokalemia -Likely due to home diuretics and poor p.o. intake -Potassium 4.6 this morning, stable from yesterday  Chronic diastolic CHF -2D echo in July 2018 has an EF of 55-60% with grade 1 diastolic dysfunction.   -Euvolemic this morning  Acute encephalopathy -Due to COPD exacerbation and hypoxic and hypercarbic respiratory failure.  Resolved  Mild cognitive impairment -Oriented oriented 4, functional at home, continue donepezil  Anxiety depression -Continue Lexapro  Hyperlipidemia -Continue Lipitor   DVT prophylaxis: Lovenox Code Status: Full code Family Communication: No family at bedside Disposition Plan: Keep in stepdown today  Consultants:   None  Procedures:   None   Antimicrobials:  Azithromycin 8/24 >> 8/25  Doxycycline 8/25 >>  Subjective: -Feels a little better this morning, she is hungry and wants to eat.  Appears comfortable on nasal cannula.  Denies any significant shortness of breath or chest pain.  Objective: Vitals:   03/23/17 0600 03/23/17 0750 03/23/17 0800 03/23/17 1000  BP: (!) 150/65  128/80 (!) 166/84  Pulse: 70 89 93 75  Resp: 20 (!) 22 (!) 22 20  Temp:   98.2 F (36.8 C)   TempSrc:   Oral   SpO2: 100% 100% 100% 100%  Weight:      Height:        Intake/Output Summary (Last 24 hours) at 03/23/17 1049 Last data filed at 03/23/17 1025  Gross per 24 hour  Intake              500 ml  Output  0 ml  Net              500 ml   Filed Weights   03/20/17 1439 03/21/17 0411  Weight: 45.4 kg (100 lb) 52 kg (114 lb 10.2 oz)    Examination:  Vitals:   03/23/17 0600 03/23/17 0750 03/23/17 0800 03/23/17 1000  BP: (!) 150/65  128/80 (!) 166/84  Pulse: 70 89 93 75  Resp: 20 (!) 22 (!) 22 20   Temp:   98.2 F (36.8 C)   TempSrc:   Oral   SpO2: 100% 100% 100% 100%  Weight:      Height:        Constitutional: No apparent distress, essential tremor apparent Eyes: No scleral icterus, lids and conjunctivae are normal Respiratory: Scattered end expiratory wheezing, overall decreased breath sounds, no crackles Cardiovascular: Regular rate and rhythm, no murmurs.  No peripheral edema.  2+ pedal pulses  Abdomen: Soft, nontender.  Bowel sounds are positive. Skin: no rashes, lesions, ulcers. No induration Neurologic: No focal findings   Data Reviewed: I have independently reviewed following labs and imaging studies   CBC:  Recent Labs Lab 03/20/17 1537 03/21/17 0225 03/22/17 0523 03/23/17 0310  WBC 10.8* 6.8 18.6* 14.5*  NEUTROABS 9.6*  --   --   --   HGB 13.7 10.5* 10.1* 10.1*  HCT 37.8 29.4* 30.1* 30.2*  MCV 92.6 93.3 96.5 97.4  PLT 271 211 233 741   Basic Metabolic Panel:  Recent Labs Lab 03/20/17 1537 03/20/17 1955 03/20/17 2228 03/21/17 0225 03/22/17 0523 03/23/17 0310  NA 127* 129*  --  131* 137 138  K 3.5 <2.0* <2.0* 2.6* 4.6 4.6  CL <65* 75*  --  87* 104 103  CO2 50* 40*  --  31 29 29   GLUCOSE 159* 294*  --  193* 123* 128*  BUN 16 17  --  17 13 17   CREATININE 1.00 0.96  --  0.77 0.45 0.43*  CALCIUM 8.7* 8.0*  --  7.6* 7.9* 8.5*  MG  --   --  1.3*  --   --   --    GFR: Estimated Creatinine Clearance: 48 mL/min (A) (by C-G formula based on SCr of 0.43 mg/dL (L)). Liver Function Tests:  Recent Labs Lab 03/20/17 1537 03/21/17 0225  AST 70* 40  ALT 12* 21  ALKPHOS 64 53  BILITOT 2.7* 0.5  PROT 6.4* 5.3*  ALBUMIN 3.0* 2.6*   No results for input(s): LIPASE, AMYLASE in the last 168 hours. No results for input(s): AMMONIA in the last 168 hours. Coagulation Profile:  Recent Labs Lab 03/20/17 1955  INR 0.96   Cardiac Enzymes:  Recent Labs Lab 03/20/17 1955 03/21/17 0225 03/21/17 0714  TROPONINI 0.03* <0.03 <0.03   BNP (last 3  results)  Recent Labs  01/13/17 1723  PROBNP 42.0   HbA1C: No results for input(s): HGBA1C in the last 72 hours. CBG: No results for input(s): GLUCAP in the last 168 hours. Lipid Profile: No results for input(s): CHOL, HDL, LDLCALC, TRIG, CHOLHDL, LDLDIRECT in the last 72 hours. Thyroid Function Tests:  Recent Labs  03/21/17 0225  TSH 0.121*   Anemia Panel: No results for input(s): VITAMINB12, FOLATE, FERRITIN, TIBC, IRON, RETICCTPCT in the last 72 hours. Urine analysis:    Component Value Date/Time   COLORURINE YELLOW 03/20/2017 [place] 03/20/2017 1704   LABSPEC 1.025 03/20/2017 1704   PHURINE 6.0 03/20/2017 1704   GLUCOSEU NEGATIVE 03/20/2017 1704  GLUCOSEU NEGATIVE 12/29/2016 [place] 03/20/2017 1704   BILIRUBINUR NEGATIVE 03/20/2017 [place] 03/20/2017 1704   PROTEINUR NEGATIVE 03/20/2017 1704   UROBILINOGEN 0.2 12/29/2016 1239   NITRITE NEGATIVE 03/20/2017 1704   LEUKOCYTESUR NEGATIVE 03/20/2017 1704   Sepsis Labs: Invalid input(s): PROCALCITONIN, LACTICIDVEN  Recent Results (from the past 240 hour(s))  Urine Culture     Status: None   Collection Time: 03/20/17  5:04 PM  Result Value Ref Range Status   Specimen Description URINE, CLEAN CATCH  Final   Special Requests NONE  Final   Culture   Final    NO GROWTH Performed at [facility], [address], [place], [place] [address]    Report Status 03/22/2017 FINAL  Final  Blood Culture (routine x 2)     Status: Abnormal   Collection Time: 03/20/17  5:04 PM  Result Value Ref Range Status   Specimen Description BLOOD LEFT ANTECUBITAL  Final   Special Requests   Final    BOTTLES DRAWN AEROBIC AND ANAEROBIC Blood Culture adequate volume   Culture  Setup Time   Final    GRAM POSITIVE COCCI AEROBIC BOTTLE ONLY CRITICAL RESULT CALLED TO, READ BACK BY AND VERIFIED WITH: PHARMD T PICKERING [id number] MLM    Culture (A)  Final    STAPHYLOCOCCUS SPECIES  (COAGULASE NEGATIVE) THE SIGNIFICANCE OF ISOLATING THIS ORGANISM FROM A SINGLE SET OF BLOOD CULTURES WHEN MULTIPLE SETS ARE DRAWN IS UNCERTAIN. PLEASE NOTIFY THE MICROBIOLOGY DEPARTMENT WITHIN ONE WEEK IF SPECIATION AND SENSITIVITIES ARE REQUIRED. Performed at [facility], [place] [address] [place],  [address]    Report Status 03/23/2017 FINAL  Final  Blood Culture ID Panel (Reflexed)     Status: Abnormal   Collection Time: 03/20/17  5:04 PM  Result Value Ref Range Status   Enterococcus species NOT DETECTED NOT DETECTED Final   Listeria monocytogenes NOT DETECTED NOT DETECTED Final   Staphylococcus species DETECTED (A) NOT DETECTED Final    Comment: Methicillin (oxacillin) susceptible coagulase negative staphylococcus. Possible blood culture contaminant (unless isolated from more than one blood culture draw or clinical case suggests pathogenicity). No antibiotic treatment is indicated for blood  culture contaminants. CRITICAL RESULT CALLED TO, READ BACK BY AND VERIFIED WITH: PHARMD T PICKERING [id number] MLM    Staphylococcus aureus NOT DETECTED NOT DETECTED Final   Methicillin resistance NOT DETECTED NOT DETECTED Final   Streptococcus species NOT DETECTED NOT DETECTED Final   Streptococcus agalactiae NOT DETECTED NOT DETECTED Final   Streptococcus pneumoniae NOT DETECTED NOT DETECTED Final   Streptococcus pyogenes NOT DETECTED NOT DETECTED Final   Acinetobacter baumannii NOT DETECTED NOT DETECTED Final   Enterobacteriaceae species NOT DETECTED NOT DETECTED Final   Enterobacter cloacae complex NOT DETECTED NOT DETECTED Final   Escherichia coli NOT DETECTED NOT DETECTED Final   Klebsiella oxytoca NOT DETECTED NOT DETECTED Final   Klebsiella pneumoniae NOT DETECTED NOT DETECTED Final   Proteus species NOT DETECTED NOT DETECTED Final   Serratia marcescens NOT DETECTED NOT DETECTED Final   Haemophilus influenzae NOT DETECTED NOT DETECTED Final   Neisseria meningitidis NOT  DETECTED NOT DETECTED Final   Pseudomonas aeruginosa NOT DETECTED NOT DETECTED Final   Candida albicans NOT DETECTED NOT DETECTED Final   Candida glabrata NOT DETECTED NOT DETECTED Final   Candida krusei NOT DETECTED NOT DETECTED Final   Candida parapsilosis NOT DETECTED NOT DETECTED Final   Candida tropicalis NOT DETECTED NOT DETECTED  Final    Comment: Performed at [facility], [place] [address], [place], [place] [address]  Blood Culture (routine x 2)     Status: None (Preliminary result)   Collection Time: 03/20/17  5:05 PM  Result Value Ref Range Status   Specimen Description BLOOD RIGHT ANTECUBITAL  Final   Special Requests   Final    BOTTLES DRAWN AEROBIC AND ANAEROBIC Blood Culture adequate volume   Culture   Final    NO GROWTH 2 DAYS Performed at [facility], [place] [address], [place], [place] [address]    Report Status PENDING  Incomplete  MRSA PCR Screening     Status: None   Collection Time: 03/20/17  9:19 PM  Result Value Ref Range Status   MRSA by PCR NEGATIVE NEGATIVE Final    Comment:        The GeneXpert MRSA Assay (FDA approved for NASAL specimens only), is one component of a comprehensive MRSA colonization surveillance program. It is not intended to diagnose MRSA infection nor to guide or monitor treatment for MRSA infections.   Respiratory Panel by PCR     Status: None   Collection Time: 03/21/17  7:06 AM  Result Value Ref Range Status   Adenovirus NOT DETECTED NOT DETECTED Final   Coronavirus 229E NOT DETECTED NOT DETECTED Final   Coronavirus HKU1 NOT DETECTED NOT DETECTED Final   Coronavirus NL63 NOT DETECTED NOT DETECTED Final   Coronavirus OC43 NOT DETECTED NOT DETECTED Final   Metapneumovirus NOT DETECTED NOT DETECTED Final   Rhinovirus / Enterovirus NOT DETECTED NOT DETECTED Final   Influenza A NOT DETECTED NOT DETECTED Final   Influenza B NOT DETECTED NOT DETECTED Final   Parainfluenza Virus 1 NOT DETECTED NOT DETECTED Final    Parainfluenza Virus 2 NOT DETECTED NOT DETECTED Final   Parainfluenza Virus 3 NOT DETECTED NOT DETECTED Final   Parainfluenza Virus 4 NOT DETECTED NOT DETECTED Final   Respiratory Syncytial Virus NOT DETECTED NOT DETECTED Final   Bordetella pertussis NOT DETECTED NOT DETECTED Final   Chlamydophila pneumoniae NOT DETECTED NOT DETECTED Final   Mycoplasma pneumoniae NOT DETECTED NOT DETECTED Final    Comment: Performed at [facility], [place] [address], [place], [place] [address]  C difficile quick scan w PCR reflex     Status: None   Collection Time: 03/21/17  2:23 PM  Result Value Ref Range Status   C Diff antigen NEGATIVE NEGATIVE Final   C Diff toxin NEGATIVE NEGATIVE Final   C Diff interpretation No C. difficile detected.  Final      Radiology Studies: Dg Chest Port 1 View  Result Date: 03/22/2017 CLINICAL DATA:  Dyspnea for 1 day.  History of asthma. EXAM: PORTABLE CHEST 1 VIEW COMPARISON:  03/20/2017 FINDINGS: Lucency in the upper lungs is similar to the previous examination. No focal airspace disease or pulmonary edema. Slightly prominent lung markings appear chronic. Heart size is normal. Atherosclerotic calcifications at the aortic arch. Surgical plate in the cervical spine. IMPRESSION: No active disease. Electronically Signed   By: Markus Daft M.D.   On: 03/22/2017 09:27     Scheduled Meds: . acidophilus  1 capsule Oral Daily  . aspirin EC  81 mg Oral QHS  . atorvastatin  20 mg Oral QHS  . B-complex with vitamin C  1 tablet Oral QHS  . budesonide (PULMICORT) nebulizer solution  0.25 mg Nebulization BID  . chlorhexidine  15 mL Mouth Rinse BID  .  donepezil  10 mg Oral QHS  . enoxaparin (LOVENOX) injection  40 mg Subcutaneous Q24H  . escitalopram  10 mg Oral Daily  . gabapentin  100 mg Oral TID  . guaiFENesin  600 mg Oral BID  . ipratropium  0.5 mg Nebulization QID  . levalbuterol  1.25 mg Nebulization QID  . mouth rinse  15 mL Mouth Rinse q12n4p  . methylPREDNISolone  (SOLU-MEDROL) injection  60 mg Intravenous TID  . montelukast  10 mg Oral QHS  . roflumilast  500 mcg Oral Daily  . umeclidinium bromide  1 puff Inhalation Daily   Continuous Infusions: . doxycycline (VIBRAMYCIN) IV 100 mg (03/23/17 1025)     Marzetta Board, MD, PhD [facility] Pager [phone]  If 7PM-7AM, please contact night-coverage [email or web address] Password [facility] 03/23/2017, 10:49 AM

## 2017-03-23 NOTE — Progress Notes (Signed)
BiPAP V-60 remains @ bedside for PRN use, pt. seen by RT earlier this shift an was tolerating n/c well with little to no difficulty completing full sentences, fan placed on pts. bedside table, RN made aware, RT to monitor.

## 2017-03-24 LAB — CBC
HCT: 30.9 % — ABNORMAL LOW (ref 36.0–46.0)
HEMOGLOBIN: 10.3 g/dL — AB (ref 12.0–15.0)
MCH: 32.8 pg (ref 26.0–34.0)
MCHC: 33.3 g/dL (ref 30.0–36.0)
MCV: 98.4 fL (ref 78.0–100.0)
Platelets: 213 10*3/uL (ref 150–400)
RBC: 3.14 MIL/uL — AB (ref 3.87–5.11)
RDW: 12.9 % (ref 11.5–15.5)
WBC: 11.1 10*3/uL — ABNORMAL HIGH (ref 4.0–10.5)

## 2017-03-24 LAB — BASIC METABOLIC PANEL
ANION GAP: 6 (ref 5–15)
BUN: 19 mg/dL (ref 6–20)
CALCIUM: 8.6 mg/dL — AB (ref 8.9–10.3)
CHLORIDE: 103 mmol/L (ref 101–111)
CO2: 28 mmol/L (ref 22–32)
CREATININE: 0.4 mg/dL — AB (ref 0.44–1.00)
GFR calc Af Amer: >60 mL/min (ref >60)
GFR calc non Af Amer: >60 mL/min (ref >60)
GLUCOSE: 118 mg/dL — AB (ref 65–99)
Potassium: 4.7 mmol/L (ref 3.5–5.1)
Sodium: 137 mmol/L (ref 135–145)

## 2017-03-24 MED ORDER — DOXYCYCLINE HYCLATE 100 MG PO TABS
100.0000 mg | ORAL_TABLET | Freq: Two times a day (BID) | ORAL | Status: DC
  Administered 2017-03-24 – 2017-03-26 (×5): 100 mg via ORAL
  Filled 2017-03-24 (×5): qty 1

## 2017-03-24 MED ORDER — ENSURE ENLIVE PO LIQD
237.0000 mL | Freq: Two times a day (BID) | ORAL | Status: DC
  Administered 2017-03-24 – 2017-03-25 (×2): 237 mL via ORAL

## 2017-03-24 MED ORDER — FUROSEMIDE 10 MG/ML IJ SOLN
40.0000 mg | Freq: Once | INTRAMUSCULAR | Status: AC
  Administered 2017-03-24: 40 mg via INTRAVENOUS
  Filled 2017-03-24: qty 4

## 2017-03-24 MED ORDER — METHYLPREDNISOLONE SODIUM SUCC 125 MG IJ SOLR
60.0000 mg | Freq: Two times a day (BID) | INTRAMUSCULAR | Status: DC
  Administered 2017-03-24 – 2017-03-26 (×4): 60 mg via INTRAVENOUS
  Filled 2017-03-24 (×4): qty 2

## 2017-03-24 NOTE — Progress Notes (Signed)
Occupational Therapy Treatment Patient Details Name: Michele Martin MRN: [id number] DOB: 1945-12-15 Today's Date: 03/24/2017    History of present illness Michele Martin is a 71 y.o. female was admitted with  history of asthma/COPD and chronic respiratory failure as well as angle closure glaucoma, allergic rhinitis, sinusitis, osteoarthritis, chronic back pain, anxiety/depression, hyperlipidemia, osteopenia, and tremor who presented to the ED via EMS for multiple falls in the setting of worsening dyspnea.  initially in ICU/step down, transferred to regular floor and returned to icu/step down due to respiratory distress and need for bipap   OT comments  Pt ambulated to bathroom with min guard assistance.  Sats 95-100% on 2 liters 02  Follow Up Recommendations  Home health OT    Equipment Recommendations  None recommended by OT (as long as pt improves to be home alone)    Recommendations for Other Services      Precautions / Restrictions Precautions Precautions: Fall Precaution Comments: watch 02 sats Restrictions Weight Bearing Restrictions: No       Mobility Bed Mobility Overal bed mobility: Modified Independent             General bed mobility comments: HOB up, used rail  Transfers   Equipment used: Rolling walker (2 wheeled)   Sit to Stand: Min guard         General transfer comment: cues for hand placement and safety    Balance                                           ADL either performed or assessed with clinical judgement   ADL       Grooming: Wash/dry hands;Supervision/safety;Standing                   Toilet Transfer: Min guard;Minimal assistance;Ambulation;Comfort height toilet;Grab bars;RW             General ADL Comments: min guard from high bed and min A from lower commode with grab bar     Vision       Perception     Praxis      Cognition Arousal/Alertness: Awake/alert Behavior During  Therapy: WFL for tasks assessed/performed Overall Cognitive Status: Within Functional Limits for tasks assessed                                 General Comments: per chart, mild cognitive impairment        Exercises     Shoulder Instructions       General Comments      Pertinent Vitals/ Pain       Pain Assessment: No/denies pain  Home Living                                          Prior Functioning/Environment              Frequency           Progress Toward Goals  OT Goals(current goals can now be found in the care plan section)  Progress towards OT goals: Progressing toward goals  Acute Rehab OT Goals Time For Goal Achievement: 03/28/17  Plan      Co-evaluation  AM-PAC PT "6 Clicks" Daily Activity     Outcome Measure   Help from another person eating meals?: None Help from another person taking care of personal grooming?: A Little Help from another person toileting, which includes using toliet, bedpan, or urinal?: A Little Help from another person bathing (including washing, rinsing, drying)?: A Little Help from another person to put on and taking off regular upper body clothing?: A Little Help from another person to put on and taking off regular lower body clothing?: A Little 6 Click Score: 19    End of Session    OT Visit Diagnosis: Unsteadiness on feet (R26.81)   Activity Tolerance Patient limited by fatigue   Patient Left in chair;with call bell/phone within reach   Nurse Communication          Time: 8887-5797 OT Time Calculation (min): 15 min  Charges: OT General Charges $OT Visit: 1 Visit OT Treatments $Self Care/Home Management : 8-22 mins  Lesle Chris, OTR/L [phone] 03/24/2017   Michele Martin 03/24/2017, 2:31 PM

## 2017-03-24 NOTE — Progress Notes (Signed)
PHARMACIST - PHYSICIAN COMMUNICATION DR:   Shellia Cleverly CONCERNING: Antibiotic IV to Oral Route Change Policy  RECOMMENDATION: This patient is receiving doxycycline by the intravenous route.  Based on criteria approved by the Pharmacy and Therapeutics Committee, the antibiotic(s) is/are being converted to the equivalent oral dose form(s).   DESCRIPTION: These criteria include:  Patient being treated for a respiratory tract infection, urinary tract infection, cellulitis or clostridium difficile associated diarrhea if on metronidazole  The patient is not neutropenic and does not exhibit a GI malabsorption state  The patient is eating (either orally or via tube) and/or has been taking other orally administered medications for a least 24 hours  The patient is improving clinically and has a Tmax < 100.5  If you have questions about this conversion, please contact the Pharmacy Department  []   [phone] )  [facility] []   [phone] )  [facility] []   [phone] )  [facility] []   [phone] )  [facility] [x]   [phone] )  [place], PharmD, BCPS 03/24/2017 8:46 AM

## 2017-03-24 NOTE — Progress Notes (Addendum)
PROGRESS NOTE  Michele Martin QQI:[id number] DOB: 1945/11/15 DOA: 03/20/2017 PCP: Biagio Borg, MD   LOS: 4 days   Brief Narrative / Interim history: Michele Martin is a 71 y.o. female with medical history significant of mild cognitive impairment , hyperlipidemia, COPD, asthma, depression, anxiety, tremor, dCHF, who presents with shortness of breath.  She was admitted to stepdown with COPD exacerbation.    -she improved and was transferred to the floor on 8/25 -8/26 AM she was found to be in significant respiratory distress requiring transfer back to stepdown -8/27 is improved, did require BiPAP overnight however so she was kept in stepdown -8/28 remains improved, no BiPAP required overnight.  Monitor in stepdown transfer to the floor later today if stable   Assessment & Plan: Principal Problem:   Acute on chronic respiratory failure with hypoxia and hypercapnia (HCC) Active Problems:   Anxiety and depression   COPD exacerbation (HCC)   Mild cognitive impairment   Hyperlipidemia   SIRS (systemic inflammatory response syndrome) (HCC)   Chronic diastolic CHF (congestive heart failure) (HCC)   Acute metabolic encephalopathy   Hypochloremic alkalosis   Hyponatremia  Acute on chronic hypoxic and hypercarbic respiratory failure -Initially improved, however on 8/26 was found to be in significant respiratory distress, her ABG showed acute respiratory acidosis requiring BiPAP and transferred to stepdown.  On BiPAP several hours later ABG has normalized and it has been discontinued.  Overnight 2/26-2/27 she did complain of shortness of breath requiring BiPAP to be placed again.  She is on nasal cannula this morning, feels a little bit better -Continue to monitor and stepdown, use BiPAP as needed -May be okay for floor later today  COPD exacerbation -with SIRS likely due to respiratory distress rather than true sepsis -Continue steroids, continue  levalbuterol/ipratropium -Continue Pulmicort -Lactic acid improved -Blood pressure responded to fluids, now keep off of fluids to prevent fluid overload -Respiratory virus panel negative -Continue doxycycline -Blood cultures 1 out of 2 bottles with coagulase-negative staph, likely contaminant  -Tapered off steroids today and decreased dosing to twice daily  Hyponatremia -Likely due to decreased oral intake and diuretics -Hyponatremia now resolved  Hypokalemia -Likely due to home diuretics and poor p.o. intake -Potassium overall stable now 4.7.  Chronic diastolic CHF -2D echo in July 2018 has an EF of 55-60% with grade 1 diastolic dysfunction.   -Starting to exhibit slight 1+ pitting edema today, she has been off of her Lasix, resume IV Lasix today 1 dose  Acute encephalopathy -Due to COPD exacerbation and hypoxic and hypercarbic respiratory failure.  Resolved  Mild cognitive impairment -Oriented oriented 4, functional at home, continue donepezil  Anxiety depression -Continue Lexapro  Hyperlipidemia -Continue Lipitor   DVT prophylaxis: Lovenox Code Status: Full code Family Communication: No family at bedside Disposition Plan: Keep in stepdown today, maybe floor later today  Consultants:   None  Procedures:   None   Antimicrobials:  Azithromycin 8/24 >> 8/25  Doxycycline 8/25 >>  Subjective: -She feels better, did not require BiPAP overnight.  Denies any chest pain.  No abdominal pain or nausea.  Objective: Vitals:   03/24/17 0400 03/24/17 0735 03/24/17 0800 03/24/17 0824  BP:   (!) 139/105   Pulse: 62  78   Resp: 19  (!) 21   Temp:  98 F (36.7 C)    TempSrc:  Oral    SpO2: 100%  100% 100%  Weight:      Height:  Intake/Output Summary (Last 24 hours) at 03/24/17 1013 Last data filed at 03/24/17 0946  Gross per 24 hour  Intake              620 ml  Output              900 ml  Net             -280 ml   Filed Weights   03/20/17 1439  03/21/17 0411  Weight: 45.4 kg (100 lb) 52 kg (114 lb 10.2 oz)    Examination:  Vitals:   03/24/17 0400 03/24/17 0735 03/24/17 0800 03/24/17 0824  BP:   (!) 139/105   Pulse: 62  78   Resp: 19  (!) 21   Temp:  98 F (36.7 C)    TempSrc:  Oral    SpO2: 100%  100% 100%  Weight:      Height:        Constitutional: No apparent distress, pleasant Caucasian female, mildly tremulous Eyes: Lids and conjunctivae normal, no icterus Respiratory: Improved scattered wheezing, overall decreased breath sounds, no crackles.  Normal respiratory effort Cardiovascular: Regular rate and rhythm, no murmurs.  1+ pitting lower extremity edema.  2+ pulses Abdomen: Soft, nontender to palpation.  Bowel sounds are positive.  No guarding Skin: No rashes, lesions seen Neurologic: Nonfocal, strength equal   Data Reviewed: I have independently reviewed following labs and imaging studies   CBC:  Recent Labs Lab 03/20/17 1537 03/21/17 0225 03/22/17 0523 03/23/17 0310 03/24/17 0300  WBC 10.8* 6.8 18.6* 14.5* 11.1*  NEUTROABS 9.6*  --   --   --   --   HGB 13.7 10.5* 10.1* 10.1* 10.3*  HCT 37.8 29.4* 30.1* 30.2* 30.9*  MCV 92.6 93.3 96.5 97.4 98.4  PLT 271 211 233 219 937   Basic Metabolic Panel:  Recent Labs Lab 03/20/17 1955 03/20/17 2228 03/21/17 0225 03/22/17 0523 03/23/17 0310 03/24/17 0300  NA 129*  --  131* 137 138 137  K <2.0* <2.0* 2.6* 4.6 4.6 4.7  CL 75*  --  87* 104 103 103  CO2 40*  --  31 29 29 28   GLUCOSE 294*  --  193* 123* 128* 118*  BUN 17  --  17 13 17 19   CREATININE 0.96  --  0.77 0.45 0.43* 0.40*  CALCIUM 8.0*  --  7.6* 7.9* 8.5* 8.6*  MG  --  1.3*  --   --   --   --    GFR: Estimated Creatinine Clearance: 48 mL/min (A) (by C-G formula based on SCr of 0.4 mg/dL (L)). Liver Function Tests:  Recent Labs Lab 03/20/17 1537 03/21/17 0225  AST 70* 40  ALT 12* 21  ALKPHOS 64 53  BILITOT 2.7* 0.5  PROT 6.4* 5.3*  ALBUMIN 3.0* 2.6*   No results for input(s):  LIPASE, AMYLASE in the last 168 hours. No results for input(s): AMMONIA in the last 168 hours. Coagulation Profile:  Recent Labs Lab 03/20/17 1955  INR 0.96   Cardiac Enzymes:  Recent Labs Lab 03/20/17 1955 03/21/17 0225 03/21/17 0714  TROPONINI 0.03* <0.03 <0.03   BNP (last 3 results)  Recent Labs  01/13/17 1723  PROBNP 42.0   HbA1C: No results for input(s): HGBA1C in the last 72 hours. CBG: No results for input(s): GLUCAP in the last 168 hours. Lipid Profile: No results for input(s): CHOL, HDL, LDLCALC, TRIG, CHOLHDL, LDLDIRECT in the last 72 hours. Thyroid Function Tests: No results for input(s):  TSH, T4TOTAL, FREET4, T3FREE, THYROIDAB in the last 72 hours. Anemia Panel: No results for input(s): VITAMINB12, FOLATE, FERRITIN, TIBC, IRON, RETICCTPCT in the last 72 hours. Urine analysis:    Component Value Date/Time   COLORURINE YELLOW 03/20/2017 [place] 03/20/2017 1704   LABSPEC 1.025 03/20/2017 1704   PHURINE 6.0 03/20/2017 1704   GLUCOSEU NEGATIVE 03/20/2017 1704   GLUCOSEU NEGATIVE 12/29/2016 1239   HGBUR NEGATIVE 03/20/2017 1704   BILIRUBINUR NEGATIVE 03/20/2017 1704   KETONESUR NEGATIVE 03/20/2017 1704   PROTEINUR NEGATIVE 03/20/2017 1704   UROBILINOGEN 0.2 12/29/2016 1239   NITRITE NEGATIVE 03/20/2017 1704   LEUKOCYTESUR NEGATIVE 03/20/2017 1704   Sepsis Labs: Invalid input(s): PROCALCITONIN, LACTICIDVEN  Recent Results (from the past 240 hour(s))  Urine Culture     Status: None   Collection Time: 03/20/17  5:04 PM  Result Value Ref Range Status   Specimen Description URINE, CLEAN CATCH  Final   Special Requests NONE  Final   Culture   Final    NO GROWTH Performed at [facility], [address] [place], [place] [address]    Report Status 03/22/2017 FINAL  Final  Blood Culture (routine x 2)     Status: Abnormal   Collection Time: 03/20/17  5:04 PM  Result Value Ref Range Status   Specimen Description BLOOD LEFT  ANTECUBITAL  Final   Special Requests   Final    BOTTLES DRAWN AEROBIC AND ANAEROBIC Blood Culture adequate volume   Culture  Setup Time   Final    GRAM POSITIVE COCCI AEROBIC BOTTLE ONLY CRITICAL RESULT CALLED TO, READ BACK BY AND VERIFIED WITH: PHARMD T PICKERING [phone] [id number] MLM    Culture (A)  Final    STAPHYLOCOCCUS SPECIES (COAGULASE NEGATIVE) THE SIGNIFICANCE OF ISOLATING THIS ORGANISM FROM A SINGLE SET OF BLOOD CULTURES WHEN MULTIPLE SETS ARE DRAWN IS UNCERTAIN. PLEASE NOTIFY THE MICROBIOLOGY DEPARTMENT WITHIN ONE WEEK IF SPECIATION AND SENSITIVITIES ARE REQUIRED. Performed at [facility], [place] [address] [place], [place] [address]    Report Status 03/23/2017 FINAL  Final  Blood Culture ID Panel (Reflexed)     Status: Abnormal   Collection Time: 03/20/17  5:04 PM  Result Value Ref Range Status   Enterococcus species NOT DETECTED NOT DETECTED Final   Listeria monocytogenes NOT DETECTED NOT DETECTED Final   Staphylococcus species DETECTED (A) NOT DETECTED Final    Comment: Methicillin (oxacillin) susceptible coagulase negative staphylococcus. Possible blood culture contaminant (unless isolated from more than one blood culture draw or clinical case suggests pathogenicity). No antibiotic treatment is indicated for blood  culture contaminants. CRITICAL RESULT CALLED TO, READ BACK BY AND VERIFIED WITH: PHARMD T PICKERING [phone] MLM    Staphylococcus aureus NOT DETECTED NOT DETECTED Final   Methicillin resistance NOT DETECTED NOT DETECTED Final   Streptococcus species NOT DETECTED NOT DETECTED Final   Streptococcus agalactiae NOT DETECTED NOT DETECTED Final   Streptococcus pneumoniae NOT DETECTED NOT DETECTED Final   Streptococcus pyogenes NOT DETECTED NOT DETECTED Final   Acinetobacter baumannii NOT DETECTED NOT DETECTED Final   Enterobacteriaceae species NOT DETECTED NOT DETECTED Final   Enterobacter cloacae complex NOT DETECTED NOT DETECTED Final   Escherichia coli  NOT DETECTED NOT DETECTED Final   Klebsiella oxytoca NOT DETECTED NOT DETECTED Final   Klebsiella pneumoniae NOT DETECTED NOT DETECTED Final   Proteus species NOT DETECTED NOT DETECTED Final   Serratia marcescens NOT DETECTED NOT DETECTED Final   Haemophilus influenzae  NOT DETECTED NOT DETECTED Final   Neisseria meningitidis NOT DETECTED NOT DETECTED Final   Pseudomonas aeruginosa NOT DETECTED NOT DETECTED Final   Candida albicans NOT DETECTED NOT DETECTED Final   Candida glabrata NOT DETECTED NOT DETECTED Final   Candida krusei NOT DETECTED NOT DETECTED Final   Candida parapsilosis NOT DETECTED NOT DETECTED Final   Candida tropicalis NOT DETECTED NOT DETECTED Final    Comment: Performed at [facility], [place] [address], [place], [place] [address]  Blood Culture (routine x 2)     Status: None (Preliminary result)   Collection Time: 03/20/17  5:05 PM  Result Value Ref Range Status   Specimen Description BLOOD RIGHT ANTECUBITAL  Final   Special Requests   Final    BOTTLES DRAWN AEROBIC AND ANAEROBIC Blood Culture adequate volume   Culture   Final    NO GROWTH 3 DAYS Performed at [facility], [place] [address], [place], [place] [address]    Report Status PENDING  Incomplete  MRSA PCR Screening     Status: None   Collection Time: 03/20/17  9:19 PM  Result Value Ref Range Status   MRSA by PCR NEGATIVE NEGATIVE Final    Comment:        The GeneXpert MRSA Assay (FDA approved for NASAL specimens only), is one component of a comprehensive MRSA colonization surveillance program. It is not intended to diagnose MRSA infection nor to guide or monitor treatment for MRSA infections.   Respiratory Panel by PCR     Status: None   Collection Time: 03/21/17  7:06 AM  Result Value Ref Range Status   Adenovirus NOT DETECTED NOT DETECTED Final   Coronavirus 229E NOT DETECTED NOT DETECTED Final   Coronavirus HKU1 NOT DETECTED NOT DETECTED Final   Coronavirus NL63 NOT DETECTED NOT  DETECTED Final   Coronavirus OC43 NOT DETECTED NOT DETECTED Final   Metapneumovirus NOT DETECTED NOT DETECTED Final   Rhinovirus / Enterovirus NOT DETECTED NOT DETECTED Final   Influenza A NOT DETECTED NOT DETECTED Final   Influenza B NOT DETECTED NOT DETECTED Final   Parainfluenza Virus 1 NOT DETECTED NOT DETECTED Final   Parainfluenza Virus 2 NOT DETECTED NOT DETECTED Final   Parainfluenza Virus 3 NOT DETECTED NOT DETECTED Final   Parainfluenza Virus 4 NOT DETECTED NOT DETECTED Final   Respiratory Syncytial Virus NOT DETECTED NOT DETECTED Final   Bordetella pertussis NOT DETECTED NOT DETECTED Final   Chlamydophila pneumoniae NOT DETECTED NOT DETECTED Final   Mycoplasma pneumoniae NOT DETECTED NOT DETECTED Final    Comment: Performed at [facility], [place] [address] [place],  [address]  C difficile quick scan w PCR reflex     Status: None   Collection Time: 03/21/17  2:23 PM  Result Value Ref Range Status   C Diff antigen NEGATIVE NEGATIVE Final   C Diff toxin NEGATIVE NEGATIVE Final   C Diff interpretation No C. difficile detected.  Final      Radiology Studies: No results found.   Scheduled Meds: . acidophilus  1 capsule Oral Daily  . aspirin EC  81 mg Oral QHS  . atorvastatin  20 mg Oral QHS  . B-complex with vitamin C  1 tablet Oral QHS  . budesonide (PULMICORT) nebulizer solution  0.25 mg Nebulization BID  . chlorhexidine  15 mL Mouth Rinse BID  . donepezil  10 mg Oral QHS  . doxycycline  100 mg Oral Q12H  . enoxaparin (LOVENOX)  injection  40 mg Subcutaneous Q24H  . escitalopram  10 mg Oral Daily  . gabapentin  100 mg Oral TID  . guaiFENesin  600 mg Oral BID  . ipratropium  0.5 mg Nebulization QID  . levalbuterol  1.25 mg Nebulization QID  . mouth rinse  15 mL Mouth Rinse q12n4p  . methylPREDNISolone (SOLU-MEDROL) injection  60 mg Intravenous TID  . montelukast  10 mg Oral QHS  . roflumilast  500 mcg Oral Daily  . umeclidinium bromide  1 puff  Inhalation Daily   Continuous Infusions:    Marzetta Board, MD, PhD [facility] Pager [phone]  If 7PM-7AM, please contact night-coverage [email or web address] Password [email or web address] 03/24/2017, 10:13 AM

## 2017-03-24 NOTE — Consult Note (Signed)
   [facility] Inpatient Consult   03/24/2017  Michele Martin Mar 14, 1946 [id number]   Michele Martin is active with [facility] program. Please see chart review tab then encounters for further patient outreach details.  She is currently on stepdown unit. Will follow up at a later time.  Made inpatient RNCM aware patient is active with [facility] program.    Michele Rolling, MSN-Ed, RN,BSN [facility] Liaison [phone]

## 2017-03-25 ENCOUNTER — Other Ambulatory Visit: Payer: Self-pay | Admitting: Internal Medicine

## 2017-03-25 LAB — CBC
HCT: 32.8 % — ABNORMAL LOW (ref 36.0–46.0)
Hemoglobin: 10.9 g/dL — ABNORMAL LOW (ref 12.0–15.0)
MCH: 32.5 pg (ref 26.0–34.0)
MCHC: 33.2 g/dL (ref 30.0–36.0)
MCV: 97.9 fL (ref 78.0–100.0)
PLATELETS: 215 10*3/uL (ref 150–400)
RBC: 3.35 MIL/uL — ABNORMAL LOW (ref 3.87–5.11)
RDW: 12.8 % (ref 11.5–15.5)
WBC: 9 10*3/uL (ref 4.0–10.5)

## 2017-03-25 LAB — BASIC METABOLIC PANEL
Anion gap: 6 (ref 5–15)
BUN: 19 mg/dL (ref 6–20)
CALCIUM: 8.6 mg/dL — AB (ref 8.9–10.3)
CO2: 32 mmol/L (ref 22–32)
CREATININE: 0.45 mg/dL (ref 0.44–1.00)
Chloride: 101 mmol/L (ref 101–111)
GFR calc Af Amer: >60 mL/min (ref >60)
GLUCOSE: 116 mg/dL — AB (ref 65–99)
POTASSIUM: 4.4 mmol/L (ref 3.5–5.1)
SODIUM: 139 mmol/L (ref 135–145)

## 2017-03-25 LAB — CULTURE, BLOOD (ROUTINE X 2)
Culture: NO GROWTH
SPECIAL REQUESTS: ADEQUATE

## 2017-03-25 LAB — MAGNESIUM: MAGNESIUM: 1.6 mg/dL — AB (ref 1.7–2.4)

## 2017-03-25 MED ORDER — FUROSEMIDE 10 MG/ML IJ SOLN
40.0000 mg | Freq: Once | INTRAMUSCULAR | Status: AC
  Administered 2017-03-25: 40 mg via INTRAVENOUS
  Filled 2017-03-25: qty 4

## 2017-03-25 MED ORDER — MAGNESIUM SULFATE 2 GM/50ML IV SOLN
2.0000 g | Freq: Once | INTRAVENOUS | Status: AC
  Administered 2017-03-25: 2 g via INTRAVENOUS
  Filled 2017-03-25: qty 50

## 2017-03-25 MED ORDER — LEVALBUTEROL HCL 1.25 MG/0.5ML IN NEBU
1.2500 mg | INHALATION_SOLUTION | Freq: Three times a day (TID) | RESPIRATORY_TRACT | Status: DC
  Administered 2017-03-25 – 2017-03-26 (×3): 1.25 mg via RESPIRATORY_TRACT
  Filled 2017-03-25 (×4): qty 0.5

## 2017-03-25 MED ORDER — IPRATROPIUM BROMIDE 0.02 % IN SOLN
0.5000 mg | Freq: Three times a day (TID) | RESPIRATORY_TRACT | Status: DC
  Administered 2017-03-25 – 2017-03-26 (×3): 0.5 mg via RESPIRATORY_TRACT
  Filled 2017-03-25 (×4): qty 2.5

## 2017-03-25 NOTE — Progress Notes (Signed)
PROGRESS NOTE  MANE CONSOLO YIF:[id number] DOB: [birth year] DOA: 03/20/2017 PCP: Biagio Borg, MD   LOS: 5 days   Brief Narrative / Interim history: Michele Martin is a 71 y.o. female with medical history significant of mild cognitive impairment , hyperlipidemia, COPD, asthma, depression, anxiety, tremor, dCHF, who presents with shortness of breath.  She was admitted to stepdown with COPD exacerbation.    -she improved and was transferred to the floor on 8/25 -8/26 AM she was found to be in significant respiratory distress requiring transfer back to stepdown -8/27 is improved, did require BiPAP overnight however so she was kept in stepdown -8/28 remains improved, no BiPAP required overnight.  Monitor in stepdown transfer to the floor later today if stable   Assessment & Plan: Principal Problem:   Acute on chronic respiratory failure with hypoxia and hypercapnia (HCC) Active Problems:   Anxiety and depression   COPD exacerbation (HCC)   Mild cognitive impairment   Hyperlipidemia   SIRS (systemic inflammatory response syndrome) (HCC)   Chronic diastolic CHF (congestive heart failure) (HCC)   Acute metabolic encephalopathy   Hypochloremic alkalosis   Hyponatremia  Acute on chronic hypoxic and hypercarbic respiratory failure -Initially improved, however on 8/26 was found to be in significant respiratory distress, her ABG showed acute respiratory acidosis requiring BiPAP and transferred to stepdown.  On BiPAP several hours later ABG has normalized and it has been discontinued.  Overnight 2/26-2/27 she did complain of shortness of breath requiring BiPAP to be placed again.    -Patient feeling well, breathing at baseline.  -Continue Solumedrol, nebulizer.  Transfer to regular floor.   COPD exacerbation -with SIRS likely due to respiratory distress rather than true sepsis -Continue steroids, continue levalbuterol/ipratropium -Continue Pulmicort -Lactic acid  improved -Blood pressure responded to fluids, now keep off of fluids to prevent fluid overload -Respiratory virus panel negative -Continue doxycycline -Blood cultures 1 out of 2 bottles with coagulase-negative staph, likely contaminant  Continue with IV steroids.   Hyponatremia -Likely due to decreased oral intake and diuretics -Hyponatremia now resolved  Hypokalemia -Likely due to home diuretics and poor p.o. intake -resolved.   Chronic diastolic CHF -2D echo in July 2018 has an EF of 55-60% with grade 1 diastolic dysfunction.   -Starting to exhibit slight 1+ pitting edema today, she has been off of her Lasix, resume IV received one time dose lasix yesterday. Will repeat IV lasix today   Acute encephalopathy -Due to COPD exacerbation and hypoxic and hypercarbic respiratory failure.  Resolved  Mild cognitive impairment -Oriented oriented 4, functional at home, continue donepezil  Anxiety depression -Continue Lexapro  Hyperlipidemia -Continue Lipitor  Hypomagnesemia;  Replaced.  Repeat labs  Will order IV mg  Non-severe (moderate) malnutrition in context of chronic illness On supplement.   Hyponatremia resolved.    DVT prophylaxis: Lovenox Code Status: Full code Family Communication: No family at bedside Disposition Plan: Keep in stepdown today, maybe floor later today  Consultants:   None  Procedures:   None   Antimicrobials:  Azithromycin 8/24 >> 8/25  Doxycycline 8/25 >>  Subjective: Breathing better. She has not required BIPAP/    Objective: Vitals:   03/24/17 2128 03/24/17 2310 03/25/17 0321 03/25/17 0807  BP: 116/64     Pulse: 82     Resp: (!) 22     Temp:  97.8 F (36.6 C) 98.1 F (36.7 C)   TempSrc:  Axillary Oral   SpO2: 100%   100%  Weight:  Height:        Intake/Output Summary (Last 24 hours) at 03/25/17 0824 Last data filed at 03/24/17 1700  Gross per 24 hour  Intake              220 ml  Output             1325 ml   Net            -1105 ml   Filed Weights   03/20/17 1439 03/21/17 0411  Weight: 45.4 kg (100 lb) 52 kg (114 lb 10.2 oz)    Examination:  Vitals:   03/24/17 2128 03/24/17 2310 03/25/17 0321 03/25/17 0807  BP: 116/64     Pulse: 82     Resp: (!) 22     Temp:  97.8 F (36.6 C) 98.1 F (36.7 C)   TempSrc:  Axillary Oral   SpO2: 100%   100%  Weight:      Height:        Constitutional: No Acute distress.  Eyes: Lids and conjunctivae normal, no icterus Respiratory: bilateral sporadic wheezing  Cardiovascular: S 1, S 2 RRR, trace edema.  Abdomen: Soft, nontender to palpation.  Bowel sounds are positive.  No guarding Skin: no rash.  Neurologic: non focal.    Data Reviewed: I have independently reviewed following labs and imaging studies   CBC:  Recent Labs Lab 03/20/17 1537 03/21/17 0225 03/22/17 0523 03/23/17 0310 03/24/17 0300 03/25/17 0253  WBC 10.8* 6.8 18.6* 14.5* 11.1* 9.0  NEUTROABS 9.6*  --   --   --   --   --   HGB 13.7 10.5* 10.1* 10.1* 10.3* 10.9*  HCT 37.8 29.4* 30.1* 30.2* 30.9* 32.8*  MCV 92.6 93.3 96.5 97.4 98.4 97.9  PLT 271 211 233 219 213 128   Basic Metabolic Panel:  Recent Labs Lab 03/20/17 2228 03/21/17 0225 03/22/17 0523 03/23/17 0310 03/24/17 0300 03/25/17 0253  NA  --  131* 137 138 137 139  K <2.0* 2.6* 4.6 4.6 4.7 4.4  CL  --  87* 104 103 103 101  CO2  --  31 29 29 28  32  GLUCOSE  --  193* 123* 128* 118* 116*  BUN  --  17 13 17 19 19   CREATININE  --  0.77 0.45 0.43* 0.40* 0.45  CALCIUM  --  7.6* 7.9* 8.5* 8.6* 8.6*  MG 1.3*  --   --   --   --   --    GFR: Estimated Creatinine Clearance: 48 mL/min (by C-G formula based on SCr of 0.45 mg/dL). Liver Function Tests:  Recent Labs Lab 03/20/17 1537 03/21/17 0225  AST 70* 40  ALT 12* 21  ALKPHOS 64 53  BILITOT 2.7* 0.5  PROT 6.4* 5.3*  ALBUMIN 3.0* 2.6*   No results for input(s): LIPASE, AMYLASE in the last 168 hours. No results for input(s): AMMONIA in the last 168  hours. Coagulation Profile:  Recent Labs Lab 03/20/17 1955  INR 0.96   Cardiac Enzymes:  Recent Labs Lab 03/20/17 1955 03/21/17 0225 03/21/17 0714  TROPONINI 0.03* <0.03 <0.03   BNP (last 3 results)  Recent Labs  01/13/17 1723  PROBNP 42.0   HbA1C: No results for input(s): HGBA1C in the last 72 hours. CBG: No results for input(s): GLUCAP in the last 168 hours. Lipid Profile: No results for input(s): CHOL, HDL, LDLCALC, TRIG, CHOLHDL, LDLDIRECT in the last 72 hours. Thyroid Function Tests: No results for input(s): TSH, T4TOTAL, FREET4, T3FREE, THYROIDAB  in the last 72 hours. Anemia Panel: No results for input(s): VITAMINB12, FOLATE, FERRITIN, TIBC, IRON, RETICCTPCT in the last 72 hours. Urine analysis:    Component Value Date/Time   COLORURINE YELLOW 03/20/2017 [place] 03/20/2017 1704   LABSPEC 1.025 03/20/2017 1704   PHURINE 6.0 03/20/2017 1704   GLUCOSEU NEGATIVE 03/20/2017 1704   GLUCOSEU NEGATIVE 12/29/2016 1239   HGBUR NEGATIVE 03/20/2017 1704   BILIRUBINUR NEGATIVE 03/20/2017 1704   KETONESUR NEGATIVE 03/20/2017 1704   PROTEINUR NEGATIVE 03/20/2017 1704   UROBILINOGEN 0.2 12/29/2016 1239   NITRITE NEGATIVE 03/20/2017 1704   LEUKOCYTESUR NEGATIVE 03/20/2017 1704   Sepsis Labs: Invalid input(s): PROCALCITONIN, LACTICIDVEN  Recent Results (from the past 240 hour(s))  Urine Culture     Status: None   Collection Time: 03/20/17  5:04 PM  Result Value Ref Range Status   Specimen Description URINE, CLEAN CATCH  Final   Special Requests NONE  Final   Culture   Final    NO GROWTH Performed at [facility], [address], [place], [place] [address]    Report Status 03/22/2017 FINAL  Final  Blood Culture (routine x 2)     Status: Abnormal   Collection Time: 03/20/17  5:04 PM  Result Value Ref Range Status   Specimen Description BLOOD LEFT ANTECUBITAL  Final   Special Requests   Final    BOTTLES DRAWN AEROBIC AND ANAEROBIC Blood  Culture adequate volume   Culture  Setup Time   Final    GRAM POSITIVE COCCI AEROBIC BOTTLE ONLY CRITICAL RESULT CALLED TO, READ BACK BY AND VERIFIED WITH: PHARMD T PICKERING [id number] MLM    Culture (A)  Final    STAPHYLOCOCCUS SPECIES (COAGULASE NEGATIVE) THE SIGNIFICANCE OF ISOLATING THIS ORGANISM FROM A SINGLE SET OF BLOOD CULTURES WHEN MULTIPLE SETS ARE DRAWN IS UNCERTAIN. PLEASE NOTIFY THE MICROBIOLOGY DEPARTMENT WITHIN ONE WEEK IF SPECIATION AND SENSITIVITIES ARE REQUIRED. Performed at [facility], [place] [address] [place], [place] [address]    Report Status 03/23/2017 FINAL  Final  Blood Culture ID Panel (Reflexed)     Status: Abnormal   Collection Time: 03/20/17  5:04 PM  Result Value Ref Range Status   Enterococcus species NOT DETECTED NOT DETECTED Final   Listeria monocytogenes NOT DETECTED NOT DETECTED Final   Staphylococcus species DETECTED (A) NOT DETECTED Final    Comment: Methicillin (oxacillin) susceptible coagulase negative staphylococcus. Possible blood culture contaminant (unless isolated from more than one blood culture draw or clinical case suggests pathogenicity). No antibiotic treatment is indicated for blood  culture contaminants. CRITICAL RESULT CALLED TO, READ BACK BY AND VERIFIED WITH: PHARMD T PICKERING [id number] MLM    Staphylococcus aureus NOT DETECTED NOT DETECTED Final   Methicillin resistance NOT DETECTED NOT DETECTED Final   Streptococcus species NOT DETECTED NOT DETECTED Final   Streptococcus agalactiae NOT DETECTED NOT DETECTED Final   Streptococcus pneumoniae NOT DETECTED NOT DETECTED Final   Streptococcus pyogenes NOT DETECTED NOT DETECTED Final   Acinetobacter baumannii NOT DETECTED NOT DETECTED Final   Enterobacteriaceae species NOT DETECTED NOT DETECTED Final   Enterobacter cloacae complex NOT DETECTED NOT DETECTED Final   Escherichia coli NOT DETECTED NOT DETECTED Final   Klebsiella oxytoca NOT DETECTED NOT DETECTED Final    Klebsiella pneumoniae NOT DETECTED NOT DETECTED Final   Proteus species NOT DETECTED NOT DETECTED Final   Serratia marcescens NOT DETECTED NOT DETECTED Final   Haemophilus influenzae NOT DETECTED NOT DETECTED Final  Neisseria meningitidis NOT DETECTED NOT DETECTED Final   Pseudomonas aeruginosa NOT DETECTED NOT DETECTED Final   Candida albicans NOT DETECTED NOT DETECTED Final   Candida glabrata NOT DETECTED NOT DETECTED Final   Candida krusei NOT DETECTED NOT DETECTED Final   Candida parapsilosis NOT DETECTED NOT DETECTED Final   Candida tropicalis NOT DETECTED NOT DETECTED Final    Comment: Performed at [facility], [place] [address], [place], [place] [address]  Blood Culture (routine x 2)     Status: None (Preliminary result)   Collection Time: 03/20/17  5:05 PM  Result Value Ref Range Status   Specimen Description BLOOD RIGHT ANTECUBITAL  Final   Special Requests   Final    BOTTLES DRAWN AEROBIC AND ANAEROBIC Blood Culture adequate volume   Culture   Final    NO GROWTH 4 DAYS Performed at [facility], [place] [address], [place], [place] [address]    Report Status PENDING  Incomplete  MRSA PCR Screening     Status: None   Collection Time: 03/20/17  9:19 PM  Result Value Ref Range Status   MRSA by PCR NEGATIVE NEGATIVE Final    Comment:        The GeneXpert MRSA Assay (FDA approved for NASAL specimens only), is one component of a comprehensive MRSA colonization surveillance program. It is not intended to diagnose MRSA infection nor to guide or monitor treatment for MRSA infections.   Respiratory Panel by PCR     Status: None   Collection Time: 03/21/17  7:06 AM  Result Value Ref Range Status   Adenovirus NOT DETECTED NOT DETECTED Final   Coronavirus 229E NOT DETECTED NOT DETECTED Final   Coronavirus HKU1 NOT DETECTED NOT DETECTED Final   Coronavirus NL63 NOT DETECTED NOT DETECTED Final   Coronavirus OC43 NOT DETECTED NOT DETECTED Final   Metapneumovirus NOT  DETECTED NOT DETECTED Final   Rhinovirus / Enterovirus NOT DETECTED NOT DETECTED Final   Influenza A NOT DETECTED NOT DETECTED Final   Influenza B NOT DETECTED NOT DETECTED Final   Parainfluenza Virus 1 NOT DETECTED NOT DETECTED Final   Parainfluenza Virus 2 NOT DETECTED NOT DETECTED Final   Parainfluenza Virus 3 NOT DETECTED NOT DETECTED Final   Parainfluenza Virus 4 NOT DETECTED NOT DETECTED Final   Respiratory Syncytial Virus NOT DETECTED NOT DETECTED Final   Bordetella pertussis NOT DETECTED NOT DETECTED Final   Chlamydophila pneumoniae NOT DETECTED NOT DETECTED Final   Mycoplasma pneumoniae NOT DETECTED NOT DETECTED Final    Comment: Performed at [facility], [place] [address], [place], [place] [address]  C difficile quick scan w PCR reflex     Status: None   Collection Time: 03/21/17  2:23 PM  Result Value Ref Range Status   C Diff antigen NEGATIVE NEGATIVE Final   C Diff toxin NEGATIVE NEGATIVE Final   C Diff interpretation No C. difficile detected.  Final      Radiology Studies: No results found.   Scheduled Meds: . acidophilus  1 capsule Oral Daily  . aspirin EC  81 mg Oral QHS  . atorvastatin  20 mg Oral QHS  . B-complex with vitamin C  1 tablet Oral QHS  . budesonide (PULMICORT) nebulizer solution  0.25 mg Nebulization BID  . chlorhexidine  15 mL Mouth Rinse BID  . donepezil  10 mg Oral QHS  . doxycycline  100 mg Oral Q12H  . enoxaparin (LOVENOX) injection  40 mg Subcutaneous Q24H  .  escitalopram  10 mg Oral Daily  . feeding supplement (ENSURE ENLIVE)  237 mL Oral BID BM  . furosemide  40 mg Intravenous Once  . gabapentin  100 mg Oral TID  . guaiFENesin  600 mg Oral BID  . ipratropium  0.5 mg Nebulization TID  . levalbuterol  1.25 mg Nebulization TID  . mouth rinse  15 mL Mouth Rinse q12n4p  . methylPREDNISolone (SOLU-MEDROL) injection  60 mg Intravenous Q12H  . montelukast  10 mg Oral QHS  . roflumilast  500 mcg Oral Daily  . umeclidinium bromide  1 puff  Inhalation Daily   Continuous Infusions:    Niel Hummer, MD.  [facility] Pager [phone]  If 7PM-7AM, please contact night-coverage [email or web address] Password [facility] 03/25/2017, 8:24 AM

## 2017-03-25 NOTE — Progress Notes (Signed)
Physical Therapy Treatment Patient Details Name: Michele Martin MRN: [id number] DOB: 1946-04-04 Today's Date: 03/25/2017    History of Present Illness Michele Martin is a 71 y.o. female was admitted with  history of asthma/COPD and chronic respiratory failure as well as angle closure glaucoma, allergic rhinitis, sinusitis, osteoarthritis, chronic back pain, anxiety/depression, hyperlipidemia, osteopenia, and tremor who presented to the ED via EMS for multiple falls in the setting of worsening dyspnea.  initially in ICU/step down, transferred to regular floor and returned to icu/step down due to respiratory distress and need for bipap    PT Comments    Pt continues motivated but with ltd progression 2* SOB with exertion.   Follow Up Recommendations  Home health PT;Supervision - Intermittent     Equipment Recommendations  None recommended by PT    Recommendations for Other Services       Precautions / Restrictions Precautions Precautions: Fall Precaution Comments: watch 02 sats Restrictions Weight Bearing Restrictions: No    Mobility  Bed Mobility               General bed mobility comments: Pt up on side of bed with nursing on arrival  Transfers Overall transfer level: Needs assistance Equipment used: Rolling walker (2 wheeled) Transfers: Sit to/from Stand Sit to Stand: Min guard         General transfer comment: cues for hand placement and safety  Ambulation/Gait Ambulation/Gait assistance: Min assist Ambulation Distance (Feet): 30 Feet (30' twice with seated rest break 2* SOB) Assistive device: Rolling walker (2 wheeled) Gait Pattern/deviations: Step-through pattern;Decreased stride length;Trunk flexed;Drifts right/left Gait velocity: decr   General Gait Details:     assist to maneuver RW, cues for pace and posture; O2 sats 94-95% on 2 L throughout, 85% on RA, distance limited by fatigue   Stairs            Wheelchair Mobility     Modified Rankin (Stroke Patients Only)       Balance Overall balance assessment: Needs assistance Sitting-balance support: No upper extremity supported Sitting balance-Leahy Scale: Good       Standing balance-Leahy Scale: Fair                              Cognition Arousal/Alertness: Awake/alert Behavior During Therapy: WFL for tasks assessed/performed Overall Cognitive Status: Within Functional Limits for tasks assessed                                 General Comments: per chart, mild cognitive impairment      Exercises      General Comments        Pertinent Vitals/Pain Pain Assessment: No/denies pain    Home Living                      Prior Function            PT Goals (current goals can now be found in the care plan section) Acute Rehab PT Goals Patient Stated Goal: get better, be able to walk and breathe, water her flowers PT Goal Formulation: With patient Time For Goal Achievement: 04/03/17 Progress towards PT goals: Not progressing toward goals - comment (Pt ltd by SOB with exertion)    Frequency    Min 3X/week      PT Plan Current plan remains appropriate    Co-evaluation  AM-PAC PT "6 Clicks" Daily Activity  Outcome Measure  Difficulty turning over in bed (including adjusting bedclothes, sheets and blankets)?: A Little Difficulty moving from lying on back to sitting on the side of the bed? : A Little Difficulty sitting down on and standing up from a chair with arms (e.g., wheelchair, bedside commode, etc,.)?: A Little Help needed moving to and from a bed to chair (including a wheelchair)?: A Little Help needed walking in hospital room?: A Little Help needed climbing 3-5 steps with a railing? : A Little 6 Click Score: 18    End of Session Equipment Utilized During Treatment: Gait belt;Oxygen Activity Tolerance: Patient limited by fatigue Patient left: in bed;with nursing/sitter in  room;Other (comment) [facility]) Nurse Communication: Mobility status;Other (comment) ([place] on RA) PT Visit Diagnosis: Difficulty in walking, not elsewhere classified (R26.2)     Time: 1000-1025 PT Time Calculation (min) (ACUTE ONLY): 25 min  Charges:  $Gait Training: 23-37 mins                    G Codes:       Pg [phone]    Michele Martin 03/25/2017, 12:41 PM

## 2017-03-26 LAB — BASIC METABOLIC PANEL
Anion gap: 5 (ref 5–15)
BUN: 19 mg/dL (ref 6–20)
CHLORIDE: 96 mmol/L — AB (ref 101–111)
CO2: 39 mmol/L — AB (ref 22–32)
Calcium: 8.9 mg/dL (ref 8.9–10.3)
Creatinine, Ser: 0.51 mg/dL (ref 0.44–1.00)
GFR calc non Af Amer: >60 mL/min (ref >60)
Glucose, Bld: 74 mg/dL (ref 65–99)
POTASSIUM: 4.1 mmol/L (ref 3.5–5.1)
SODIUM: 140 mmol/L (ref 135–145)

## 2017-03-26 LAB — MAGNESIUM: Magnesium: 1.7 mg/dL (ref 1.7–2.4)

## 2017-03-26 MED ORDER — DOXYCYCLINE HYCLATE 100 MG PO TABS: 100.0000 mg | ORAL_TABLET | Freq: Two times a day (BID) | ORAL | 0 refills | Status: DC

## 2017-03-26 MED ORDER — ENSURE ENLIVE PO LIQD: 237.0000 mL | Freq: Two times a day (BID) | ORAL | 12 refills | Status: AC

## 2017-03-26 MED ORDER — ENSURE ENLIVE PO LIQD: 237.0000 mL | Freq: Two times a day (BID) | ORAL | 12 refills | Status: DC

## 2017-03-26 MED ORDER — FUROSEMIDE 40 MG PO TABS
40.0000 mg | ORAL_TABLET | Freq: Every day | ORAL | Status: DC
Start: 2017-03-26 — End: 2017-03-26
  Administered 2017-03-26: 40 mg via ORAL
  Filled 2017-03-26: qty 1

## 2017-03-26 MED ORDER — PREDNISONE 20 MG PO TABS: ORAL_TABLET | ORAL | 0 refills | Status: DC

## 2017-03-26 MED ORDER — ALBUTEROL SULFATE (2.5 MG/3ML) 0.083% IN NEBU: 2.5000 mg | INHALATION_SOLUTION | Freq: Four times a day (QID) | RESPIRATORY_TRACT | 1 refills | Status: DC

## 2017-03-26 MED ORDER — CLONAZEPAM 0.5 MG PO TABS: ORAL_TABLET | ORAL | 5 refills | Status: AC

## 2017-03-26 MED ORDER — ALBUTEROL SULFATE HFA 108 (90 BASE) MCG/ACT IN AERS: 2.0000 | INHALATION_SPRAY | Freq: Four times a day (QID) | RESPIRATORY_TRACT | 5 refills | Status: AC | PRN

## 2017-03-26 MED ORDER — POTASSIUM CHLORIDE ER 10 MEQ PO TBCR: 10.0000 meq | EXTENDED_RELEASE_TABLET | Freq: Every day | ORAL | 0 refills | Status: AC

## 2017-03-26 MED ORDER — POTASSIUM CHLORIDE ER 20 MEQ PO TBCR: 10.0000 meq | EXTENDED_RELEASE_TABLET | Freq: Every day | ORAL | 0 refills | Status: DC

## 2017-03-26 NOTE — Progress Notes (Signed)
Pt discharge home via wheelchair. Discharge instructions were given, answered all questions. Teach back done. Pt took all belongings with her.

## 2017-03-26 NOTE — Consult Note (Addendum)
   [facility] Inpatient Consult   03/26/2017  MONIFAH FREEHLING 06-03-1946 [id number]    [facility] Care Management follow up.  Chart reviewed. Noted discharge plan for today. Spoke with inpatient RNCM regarding home health orders as it appears PT had home health recommendations per notes.   Will make [facility] RNCM aware of pending discharge today.  Marthenia Rolling, MSN-Ed, RN,BSN [facility] Liaison [phone]

## 2017-03-26 NOTE — Telephone Encounter (Signed)
Faxed

## 2017-03-26 NOTE — Progress Notes (Addendum)
Date: March 26, 2017 Chart reviewed for discharge orders/ Need for RN,PT,OT and Aide called to [facility] with [facility]/will cover. Vernia Buff, [phone]

## 2017-03-26 NOTE — Discharge Summary (Signed)
Physician Discharge Summary  Michele Martin VVO:[id number] DOB: 29-Jul-1945 DOA: 03/20/2017  PCP: Biagio Borg, MD  Admit date: 03/20/2017 Discharge date: 03/26/2017  Admitted From: Home Disposition: Home   Recommendations for Outpatient Follow-up:  1. Follow up with PCP in 1-2 weeks 2. Please obtain BMP/CBC in one week 3. Needs to follow up with pulmonologist   Home Health: yes.   Discharge Condition: stable.  CODE STATUS: full code.  Diet recommendation: Heart Healthy  Brief/Interim Summary: Brief Narrative / Interim history: Michele Martin a 71 y.o.femalewith medical history significant ofmild cognitive impairment, hyperlipidemia, COPD, asthma, depression, anxiety, tremor, dCHF, who presents with shortness of breath.  She was admitted to stepdown with COPD exacerbation.    -she improved and was transferred to the floor on 8/25 -8/26 AM she was found to be in significant respiratory distress requiring transfer back to stepdown -8/27 is improved, did require BiPAP overnight however so she was kept in stepdown -8/28 remains improved, no BiPAP required overnight.  Monitor in stepdown transfer to the floor later today if stable   Assessment & Plan: Principal Problem:   Acute on chronic respiratory failure with hypoxia and hypercapnia (HCC) Active Problems:   Anxiety and depression   COPD exacerbation (HCC)   Mild cognitive impairment   Hyperlipidemia   SIRS (systemic inflammatory response syndrome) (HCC)   Chronic diastolic CHF (congestive heart failure) (HCC)   Acute metabolic encephalopathy   Hypochloremic alkalosis   Hyponatremia  Acute on chronic hypoxic and hypercarbic respiratory failure -Initially improved, however on 8/26 was found to be in significant respiratory distress, her ABG showed acute respiratory acidosis requiring BiPAP and transferred to stepdown.  On BiPAP several hours later ABG has normalized and it has been discontinued.  Overnight  2/26-2/27 she did complain of shortness of breath requiring BiPAP to be placed again.    -Patient feeling well, breathing at baseline.  -Discharge on prednisone.  -Transfer to regular floor.  -Discussed with patient importance to compliant with medications. Per sister , patient use medications as needed.   COPD exacerbation -with SIRS likely due to respiratory distress rather than true sepsis -treated with steroids, continue levalbuterol/ipratropium -Continue Pulmicort -Lactic acid improved -Blood pressure responded to fluids, now keep off of fluids to prevent fluid overload -Respiratory virus panel negative -Continue doxycycline -Blood cultures 1 out of 2 bottles with coagulase-negative staph, likely contaminant  -Discharge   Hyponatremia -Likely due to decreased oral intake and diuretics -Hyponatremia now resolved  Hypokalemia -Likely due to home diuretics and poor p.o. intake -resolved.   Chronic diastolic CHF -2D echo in July 2018 has an EF of 55-60% with grade 1 diastolic dysfunction.   -Starting to exhibit slight 1+ pitting edema today, she has been off of her Lasix, resume IV received one time dose lasix yesterday. Resume home dose lasix   Acute encephalopathy -Due to COPD exacerbation and hypoxic and hypercarbic respiratory failure.  Resolved  Mild cognitive impairment -Oriented oriented 4, functional at home, continue donepezil  Anxiety depression -Continue Lexapro  Hyperlipidemia -Continue Lipitor  Hypomagnesemia;  Replaced.  Repeat labs    Non-severe (moderate) malnutrition in context of chronic illness On supplement.   Hyponatremia resolved.    Discharge Diagnoses:  Principal Problem:   Acute on chronic respiratory failure with hypoxia and hypercapnia (HCC) Active Problems:   Anxiety and depression   COPD exacerbation (HCC)   Mild cognitive impairment   Hyperlipidemia   SIRS (systemic inflammatory response syndrome) (HCC)    Chronic diastolic  CHF (congestive heart failure) (HCC)   Acute metabolic encephalopathy   Hypochloremic alkalosis   Hyponatremia    Discharge Instructions  Discharge Instructions    Diet - low sodium heart healthy    Complete by:  As directed    Increase activity slowly    Complete by:  As directed      Allergies as of 03/26/2017      Reactions   Pravastatin Itching, Other (See Comments)   Reaction:  Muscle cramps    Simvastatin Itching, Other (See Comments)   Reaction:  Muscle cramps    Statins Itching, Other (See Comments)   Reaction:  Muscle cramps      Medication List    STOP taking these medications   hydrochlorothiazide 25 MG tablet Commonly known as:  HYDRODIURIL     TAKE these medications   albuterol 108 (90 Base) MCG/ACT inhaler Commonly known as:  PROVENTIL HFA Inhale 2 puffs into the lungs every 6 (six) hours as needed for wheezing or shortness of breath. What changed:  Another medication with the same name was changed. Make sure you understand how and when to take each.   albuterol (2.5 MG/3ML) 0.083% nebulizer solution Commonly known as:  PROVENTIL Take 3 mLs (2.5 mg total) by nebulization every 6 (six) hours. What changed:  when to take this  reasons to take this   aspirin EC 81 MG tablet Take 1 tablet (81 mg total) by mouth at bedtime.   atorvastatin 20 MG tablet Commonly known as:  LIPITOR Take 1 tablet (20 mg total) by mouth at bedtime.   b complex vitamins tablet Take 1 tablet by mouth at bedtime.   clonazePAM 0.5 MG tablet Commonly known as:  KLONOPIN Take 1 tablet (0.5 mg total) by mouth 2 (two) times daily as needed for anxiety.   donepezil 10 MG tablet Commonly known as:  ARICEPT Take 1 tablet (10 mg total) by mouth at bedtime.   doxycycline 100 MG tablet Commonly known as:  VIBRA-TABS Take 1 tablet (100 mg total) by mouth every 12 (twelve) hours.   escitalopram 20 MG tablet Commonly known as:  LEXAPRO Take 0.5 tablets (10  mg total) by mouth daily.   feeding supplement (ENSURE ENLIVE) Liqd Take 237 mLs by mouth 2 (two) times daily between meals.   fluticasone furoate-vilanterol 200-25 MCG/INH Aepb Commonly known as:  BREO ELLIPTA Inhale 1 puff into the lungs daily.   furosemide 40 MG tablet Commonly known as:  LASIX Take 1 tablet (40 mg total) by mouth daily.   gabapentin 100 MG capsule Commonly known as:  NEURONTIN TAKE 1 CAPSULE BY MOUTH THREE TIMES DAILY   guaiFENesin 600 MG 12 hr tablet Commonly known as:  MUCINEX Take 1 tablet (600 mg total) by mouth 2 (two) times daily.   montelukast 10 MG tablet Commonly known as:  SINGULAIR TAKE 1 TABLET BY MOUTH EVERY NIGHT AT BEDTIME   potassium chloride 10 MEQ tablet Commonly known as:  K-DUR Take 1 tablet (10 mEq total) by mouth daily.   predniSONE 20 MG tablet Commonly known as:  DELTASONE Take 3 tablets for 3 days, then 2 tablets for 4 days then 1 tablet for 2 days. What changed:  medication strength  additional instructions   Probiotic Caps Take 1 capsule by mouth every day   roflumilast 500 MCG Tabs tablet Commonly known as:  DALIRESP Take 1 tablet (500 mcg total) by mouth daily.   traMADol 50 MG tablet Commonly known as:  Veatrice Bourbon  Take 1 tablet (50 mg total) by mouth every 8 (eight) hours as needed. What changed:  reasons to take this   umeclidinium bromide 62.5 MCG/INH Aepb Commonly known as:  INCRUSE ELLIPTA Inhale 1 puff into the lungs daily.            Discharge Care Instructions        Start     Ordered   03/26/17 0000  doxycycline (VIBRA-TABS) 100 MG tablet  Every 12 hours     03/26/17 0844   03/26/17 0000  feeding supplement, ENSURE ENLIVE, (ENSURE ENLIVE) LIQD  2 times daily between meals     03/26/17 0844   03/26/17 0000  albuterol (PROVENTIL) (2.5 MG/3ML) 0.083% nebulizer solution  Every 6 hours     03/26/17 0844   03/26/17 0000  Increase activity slowly     03/26/17 0844   03/26/17 0000  Diet - low sodium  heart healthy     03/26/17 0844   03/26/17 0000  predniSONE (DELTASONE) 20 MG tablet     03/26/17 0844      Allergies  Allergen Reactions  . Pravastatin Itching and Other (See Comments)    Reaction:  Muscle cramps   . Simvastatin Itching and Other (See Comments)    Reaction:  Muscle cramps   . Statins Itching and Other (See Comments)    Reaction:  Muscle cramps    Consultations:  none   Procedures/Studies: Dg Ribs Bilateral W/chest  Result Date: 03/13/2017 CLINICAL DATA:  Two week history of pain EXAM: BILATERAL RIBS AND CHEST - 4+ VIEW COMPARISON:  Chest radiograph February 24, 2017 FINDINGS: Frontal chest as well as oblique and cone-down lower rib images obtained. There is slight left base atelectasis. Lungs elsewhere are clear. Heart size and pulmonary vascularity are normal. No adenopathy. There is aortic atherosclerosis. There is no evident pneumothorax or pleural effusion. There is thoracolumbar levoscoliosis. There is postoperative change in the lower cervical region. No evident rib fracture. IMPRESSION: No evident rib fracture. Slight left base atelectasis. No edema or consolidation. There is aortic atherosclerosis. Aortic Atherosclerosis (ICD10-I70.0). Electronically Signed   By: Lowella Grip III M.D.   On: 03/13/2017 13:40   Ct Head Wo Contrast  Result Date: 03/20/2017 CLINICAL DATA:  Worsening shortness of breath and increasing weakness. EXAM: CT HEAD WITHOUT CONTRAST TECHNIQUE: Contiguous axial images were obtained from the base of the skull through the vertex without intravenous contrast. COMPARISON:  02/22/2017 FINDINGS: Brain: There is no evidence for acute hemorrhage, hydrocephalus, or abnormal extra-axial fluid collection. 17 mm right frontal parafalcine densely calcified lesion likely a meningioma, unchanged in the interval. No adjacent mass-effect or edema. No definite CT evidence for acute infarction. Diffuse loss of parenchymal volume is consistent with atrophy.  Patchy low attenuation in the deep hemispheric and periventricular white matter is nonspecific, but likely reflects chronic microvascular ischemic demyelination. Vascular: No hyperdense vessel or unexpected calcification. Skull: No evidence for fracture. No worrisome lytic or sclerotic lesion. Sinuses/Orbits: The visualized paranasal sinuses and mastoid air cells are clear. Visualized portions of the globes and intraorbital fat are unremarkable. Other: None. IMPRESSION: 1. Stable exam.  No acute intracranial abnormality. 2. Next item atrophy with chronic small vessel white matter ischemic disease. 3. Stable appearance densely calcified right frontal parafalcine meningioma. Electronically Signed   By: Misty Stanley M.D.   On: 03/20/2017 18:56   Dg Chest Port 1 View  Result Date: 03/22/2017 CLINICAL DATA:  Dyspnea for 1 day.  History of asthma. EXAM:  PORTABLE CHEST 1 VIEW COMPARISON:  03/20/2017 FINDINGS: Lucency in the upper lungs is similar to the previous examination. No focal airspace disease or pulmonary edema. Slightly prominent lung markings appear chronic. Heart size is normal. Atherosclerotic calcifications at the aortic arch. Surgical plate in the cervical spine. IMPRESSION: No active disease. Electronically Signed   By: Markus Daft M.D.   On: 03/22/2017 09:27   Dg Chest Port 1 View  Result Date: 03/20/2017 CLINICAL DATA:  COPD exacerbation. EXAM: PORTABLE CHEST 1 VIEW COMPARISON:  Chest x-ray dated March 13, 2017. FINDINGS: The cardiomediastinal silhouette is normal in size. Normal pulmonary vascularity. Emphysematous changes again noted. No focal consolidation, pleural effusion, or pneumothorax. No acute osseous abnormality. IMPRESSION: COPD.  No active disease. Electronically Signed   By: Titus Dubin M.D.   On: 03/20/2017 15:52       Subjective: Feeling better, she has not required BIPAP.  She wants to go home. Breathing at baseline.   Discharge Exam: Vitals:   03/26/17 0431  03/26/17 0756  BP:    Pulse:    Resp:    Temp: 97.8 F (36.6 C)   SpO2:  100%   Vitals:   03/26/17 0002 03/26/17 0400 03/26/17 0431 03/26/17 0756  BP: (!) 117/58 122/73    Pulse: 90 63    Resp: (!) 22 14    Temp:   97.8 F (36.6 C)   TempSrc:   Oral   SpO2: 100% 100%  100%  Weight:   51.3 kg (113 lb 1.5 oz)   Height:        General: Pt is alert, awake, not in acute distress Cardiovascular: RRR, S1/S2 +, no rubs, no gallops Respiratory: CTA bilaterally, sporadic wheezing.  Abdominal: Soft, NT, ND, bowel sounds + Extremities: no edema, no cyanosis    The results of significant diagnostics from this hospitalization (including imaging, microbiology, ancillary and laboratory) are listed below for reference.     Microbiology: Recent Results (from the past 240 hour(s))  Urine Culture     Status: None   Collection Time: 03/20/17  5:04 PM  Result Value Ref Range Status   Specimen Description URINE, CLEAN CATCH  Final   Special Requests NONE  Final   Culture   Final    NO GROWTH Performed at [facility], [address], [place], [place] [address]    Report Status 03/22/2017 FINAL  Final  Blood Culture (routine x 2)     Status: Abnormal   Collection Time: 03/20/17  5:04 PM  Result Value Ref Range Status   Specimen Description BLOOD LEFT ANTECUBITAL  Final   Special Requests   Final    BOTTLES DRAWN AEROBIC AND ANAEROBIC Blood Culture adequate volume   Culture  Setup Time   Final    GRAM POSITIVE COCCI AEROBIC BOTTLE ONLY CRITICAL RESULT CALLED TO, READ BACK BY AND VERIFIED WITH: PHARMD T PICKERING [phone] MLM    Culture (A)  Final    STAPHYLOCOCCUS SPECIES (COAGULASE NEGATIVE) THE SIGNIFICANCE OF ISOLATING THIS ORGANISM FROM A SINGLE SET OF BLOOD CULTURES WHEN MULTIPLE SETS ARE DRAWN IS UNCERTAIN. PLEASE NOTIFY THE MICROBIOLOGY DEPARTMENT WITHIN ONE WEEK IF SPECIATION AND SENSITIVITIES ARE REQUIRED. Performed at [facility], [place] [address],  [place], [place] [address]    Report Status 03/23/2017 FINAL  Final  Blood Culture ID Panel (Reflexed)     Status: Abnormal   Collection Time: 03/20/17  5:04 PM  Result Value Ref Range Status  Enterococcus species NOT DETECTED NOT DETECTED Final   Listeria monocytogenes NOT DETECTED NOT DETECTED Final   Staphylococcus species DETECTED (A) NOT DETECTED Final    Comment: Methicillin (oxacillin) susceptible coagulase negative staphylococcus. Possible blood culture contaminant (unless isolated from more than one blood culture draw or clinical case suggests pathogenicity). No antibiotic treatment is indicated for blood  culture contaminants. CRITICAL RESULT CALLED TO, READ BACK BY AND VERIFIED WITH: PHARMD T PICKERING [phone] MLM    Staphylococcus aureus NOT DETECTED NOT DETECTED Final   Methicillin resistance NOT DETECTED NOT DETECTED Final   Streptococcus species NOT DETECTED NOT DETECTED Final   Streptococcus agalactiae NOT DETECTED NOT DETECTED Final   Streptococcus pneumoniae NOT DETECTED NOT DETECTED Final   Streptococcus pyogenes NOT DETECTED NOT DETECTED Final   Acinetobacter baumannii NOT DETECTED NOT DETECTED Final   Enterobacteriaceae species NOT DETECTED NOT DETECTED Final   Enterobacter cloacae complex NOT DETECTED NOT DETECTED Final   Escherichia coli NOT DETECTED NOT DETECTED Final   Klebsiella oxytoca NOT DETECTED NOT DETECTED Final   Klebsiella pneumoniae NOT DETECTED NOT DETECTED Final   Proteus species NOT DETECTED NOT DETECTED Final   Serratia marcescens NOT DETECTED NOT DETECTED Final   Haemophilus influenzae NOT DETECTED NOT DETECTED Final   Neisseria meningitidis NOT DETECTED NOT DETECTED Final   Pseudomonas aeruginosa NOT DETECTED NOT DETECTED Final   Candida albicans NOT DETECTED NOT DETECTED Final   Candida glabrata NOT DETECTED NOT DETECTED Final   Candida krusei NOT DETECTED NOT DETECTED Final   Candida parapsilosis NOT DETECTED NOT DETECTED Final   Candida  tropicalis NOT DETECTED NOT DETECTED Final    Comment: Performed at [facility], [address] [place], [place] [address]  Blood Culture (routine x 2)     Status: None   Collection Time: 03/20/17  5:05 PM  Result Value Ref Range Status   Specimen Description BLOOD RIGHT ANTECUBITAL  Final   Special Requests   Final    BOTTLES DRAWN AEROBIC AND ANAEROBIC Blood Culture adequate volume   Culture   Final    NO GROWTH 5 DAYS Performed at [facility], [place] [address], [place], [place] [address]    Report Status 03/25/2017 FINAL  Final  MRSA PCR Screening     Status: None   Collection Time: 03/20/17  9:19 PM  Result Value Ref Range Status   MRSA by PCR NEGATIVE NEGATIVE Final    Comment:        The GeneXpert MRSA Assay (FDA approved for NASAL specimens only), is one component of a comprehensive MRSA colonization surveillance program. It is not intended to diagnose MRSA infection nor to guide or monitor treatment for MRSA infections.   Respiratory Panel by PCR     Status: None   Collection Time: 03/21/17  7:06 AM  Result Value Ref Range Status   Adenovirus NOT DETECTED NOT DETECTED Final   Coronavirus 229E NOT DETECTED NOT DETECTED Final   Coronavirus HKU1 NOT DETECTED NOT DETECTED Final   Coronavirus NL63 NOT DETECTED NOT DETECTED Final   Coronavirus OC43 NOT DETECTED NOT DETECTED Final   Metapneumovirus NOT DETECTED NOT DETECTED Final   Rhinovirus / Enterovirus NOT DETECTED NOT DETECTED Final   Influenza A NOT DETECTED NOT DETECTED Final   Influenza B NOT DETECTED NOT DETECTED Final   Parainfluenza Virus 1 NOT DETECTED NOT DETECTED Final   Parainfluenza Virus 2 NOT DETECTED NOT DETECTED Final   Parainfluenza Virus 3 NOT DETECTED NOT  DETECTED Final   Parainfluenza Virus 4 NOT DETECTED NOT DETECTED Final   Respiratory Syncytial Virus NOT DETECTED NOT DETECTED Final   Bordetella pertussis NOT DETECTED NOT DETECTED Final   Chlamydophila pneumoniae NOT DETECTED NOT  DETECTED Final   Mycoplasma pneumoniae NOT DETECTED NOT DETECTED Final    Comment: Performed at [facility], [place] [address], [place], [place] [address]  C difficile quick scan w PCR reflex     Status: None   Collection Time: 03/21/17  2:23 PM  Result Value Ref Range Status   C Diff antigen NEGATIVE NEGATIVE Final   C Diff toxin NEGATIVE NEGATIVE Final   C Diff interpretation No C. difficile detected.  Final     Labs: BNP (last 3 results)  Recent Labs  07/26/16 1225 08/04/16 2242 03/20/17 1537  BNP 44.2 52.9 37.1   Basic Metabolic Panel:  Recent Labs Lab 03/20/17 2228 03/21/17 0225 03/22/17 0523 03/23/17 0310 03/24/17 0300 03/25/17 0253  NA  --  131* 137 138 137 139  K <2.0* 2.6* 4.6 4.6 4.7 4.4  CL  --  87* 104 103 103 101  CO2  --  31 29 29 28  32  GLUCOSE  --  193* 123* 128* 118* 116*  BUN  --  17 13 17 19 19   CREATININE  --  0.77 0.45 0.43* 0.40* 0.45  CALCIUM  --  7.6* 7.9* 8.5* 8.6* 8.6*  MG 1.3*  --   --   --   --  1.6*   Liver Function Tests:  Recent Labs Lab 03/20/17 1537 03/21/17 0225  AST 70* 40  ALT 12* 21  ALKPHOS 64 53  BILITOT 2.7* 0.5  PROT 6.4* 5.3*  ALBUMIN 3.0* 2.6*   No results for input(s): LIPASE, AMYLASE in the last 168 hours. No results for input(s): AMMONIA in the last 168 hours. CBC:  Recent Labs Lab 03/20/17 1537 03/21/17 0225 03/22/17 0523 03/23/17 0310 03/24/17 0300 03/25/17 0253  WBC 10.8* 6.8 18.6* 14.5* 11.1* 9.0  NEUTROABS 9.6*  --   --   --   --   --   HGB 13.7 10.5* 10.1* 10.1* 10.3* 10.9*  HCT 37.8 29.4* 30.1* 30.2* 30.9* 32.8*  MCV 92.6 93.3 96.5 97.4 98.4 97.9  PLT 271 211 233 219 213 215   Cardiac Enzymes:  Recent Labs Lab 03/20/17 1955 03/21/17 0225 03/21/17 0714  TROPONINI 0.03* <0.03 <0.03   BNP: Invalid input(s): POCBNP CBG: No results for input(s): GLUCAP in the last 168 hours. D-Dimer No results for input(s): DDIMER in the last 72 hours. Hgb A1c No results for input(s): HGBA1C in  the last 72 hours. Lipid Profile No results for input(s): CHOL, HDL, LDLCALC, TRIG, CHOLHDL, LDLDIRECT in the last 72 hours. Thyroid function studies No results for input(s): TSH, T4TOTAL, T3FREE, THYROIDAB in the last 72 hours.  Invalid input(s): FREET3 Anemia work up No results for input(s): VITAMINB12, FOLATE, FERRITIN, TIBC, IRON, RETICCTPCT in the last 72 hours. Urinalysis    Component Value Date/Time   COLORURINE YELLOW 03/20/2017 1704   APPEARANCEUR CLEAR 03/20/2017 1704   LABSPEC 1.025 03/20/2017 1704   PHURINE 6.0 03/20/2017 1704   GLUCOSEU NEGATIVE 03/20/2017 1704   GLUCOSEU NEGATIVE 12/29/2016 1239   HGBUR NEGATIVE 03/20/2017 1704   BILIRUBINUR NEGATIVE 03/20/2017 1704   KETONESUR NEGATIVE 03/20/2017 1704   PROTEINUR NEGATIVE 03/20/2017 1704   UROBILINOGEN 0.2 12/29/2016 1239   NITRITE NEGATIVE 03/20/2017 1704   LEUKOCYTESUR NEGATIVE 03/20/2017 1704   Sepsis Labs  Invalid input(s): PROCALCITONIN,  WBC,  LACTICIDVEN Microbiology Recent Results (from the past 240 hour(s))  Urine Culture     Status: None   Collection Time: 03/20/17  5:04 PM  Result Value Ref Range Status   Specimen Description URINE, CLEAN CATCH  Final   Special Requests NONE  Final   Culture   Final    NO GROWTH Performed at [facility], [address], [place], [place] [address]    Report Status 03/22/2017 FINAL  Final  Blood Culture (routine x 2)     Status: Abnormal   Collection Time: 03/20/17  5:04 PM  Result Value Ref Range Status   Specimen Description BLOOD LEFT ANTECUBITAL  Final   Special Requests   Final    BOTTLES DRAWN AEROBIC AND ANAEROBIC Blood Culture adequate volume   Culture  Setup Time   Final    GRAM POSITIVE COCCI AEROBIC BOTTLE ONLY CRITICAL RESULT CALLED TO, READ BACK BY AND VERIFIED WITH: PHARMD T PICKERING [phone] MLM    Culture (A)  Final    STAPHYLOCOCCUS SPECIES (COAGULASE NEGATIVE) THE SIGNIFICANCE OF ISOLATING THIS ORGANISM FROM A SINGLE SET OF  BLOOD CULTURES WHEN MULTIPLE SETS ARE DRAWN IS UNCERTAIN. PLEASE NOTIFY THE MICROBIOLOGY DEPARTMENT WITHIN ONE WEEK IF SPECIATION AND SENSITIVITIES ARE REQUIRED. Performed at [facility], [place] [address] [place], [place] [address]    Report Status 03/23/2017 FINAL  Final  Blood Culture ID Panel (Reflexed)     Status: Abnormal   Collection Time: 03/20/17  5:04 PM  Result Value Ref Range Status   Enterococcus species NOT DETECTED NOT DETECTED Final   Listeria monocytogenes NOT DETECTED NOT DETECTED Final   Staphylococcus species DETECTED (A) NOT DETECTED Final    Comment: Methicillin (oxacillin) susceptible coagulase negative staphylococcus. Possible blood culture contaminant (unless isolated from more than one blood culture draw or clinical case suggests pathogenicity). No antibiotic treatment is indicated for blood  culture contaminants. CRITICAL RESULT CALLED TO, READ BACK BY AND VERIFIED WITH: PHARMD T PICKERING [phone] MLM    Staphylococcus aureus NOT DETECTED NOT DETECTED Final   Methicillin resistance NOT DETECTED NOT DETECTED Final   Streptococcus species NOT DETECTED NOT DETECTED Final   Streptococcus agalactiae NOT DETECTED NOT DETECTED Final   Streptococcus pneumoniae NOT DETECTED NOT DETECTED Final   Streptococcus pyogenes NOT DETECTED NOT DETECTED Final   Acinetobacter baumannii NOT DETECTED NOT DETECTED Final   Enterobacteriaceae species NOT DETECTED NOT DETECTED Final   Enterobacter cloacae complex NOT DETECTED NOT DETECTED Final   Escherichia coli NOT DETECTED NOT DETECTED Final   Klebsiella oxytoca NOT DETECTED NOT DETECTED Final   Klebsiella pneumoniae NOT DETECTED NOT DETECTED Final   Proteus species NOT DETECTED NOT DETECTED Final   Serratia marcescens NOT DETECTED NOT DETECTED Final   Haemophilus influenzae NOT DETECTED NOT DETECTED Final   Neisseria meningitidis NOT DETECTED NOT DETECTED Final   Pseudomonas aeruginosa NOT DETECTED NOT DETECTED Final    Candida albicans NOT DETECTED NOT DETECTED Final   Candida glabrata NOT DETECTED NOT DETECTED Final   Candida krusei NOT DETECTED NOT DETECTED Final   Candida parapsilosis NOT DETECTED NOT DETECTED Final   Candida tropicalis NOT DETECTED NOT DETECTED Final    Comment: Performed at [facility], [address] [place], [place] [address]  Blood Culture (routine x 2)     Status: None   Collection Time: 03/20/17  5:05 PM  Result Value Ref Range Status   Specimen  Description BLOOD RIGHT ANTECUBITAL  Final   Special Requests   Final    BOTTLES DRAWN AEROBIC AND ANAEROBIC Blood Culture adequate volume   Culture   Final    NO GROWTH 5 DAYS Performed at [facility], [address] [place], [place] [address]    Report Status 03/25/2017 FINAL  Final  MRSA PCR Screening     Status: None   Collection Time: 03/20/17  9:19 PM  Result Value Ref Range Status   MRSA by PCR NEGATIVE NEGATIVE Final    Comment:        The GeneXpert MRSA Assay (FDA approved for NASAL specimens only), is one component of a comprehensive MRSA colonization surveillance program. It is not intended to diagnose MRSA infection nor to guide or monitor treatment for MRSA infections.   Respiratory Panel by PCR     Status: None   Collection Time: 03/21/17  7:06 AM  Result Value Ref Range Status   Adenovirus NOT DETECTED NOT DETECTED Final   Coronavirus 229E NOT DETECTED NOT DETECTED Final   Coronavirus HKU1 NOT DETECTED NOT DETECTED Final   Coronavirus NL63 NOT DETECTED NOT DETECTED Final   Coronavirus OC43 NOT DETECTED NOT DETECTED Final   Metapneumovirus NOT DETECTED NOT DETECTED Final   Rhinovirus / Enterovirus NOT DETECTED NOT DETECTED Final   Influenza A NOT DETECTED NOT DETECTED Final   Influenza B NOT DETECTED NOT DETECTED Final   Parainfluenza Virus 1 NOT DETECTED NOT DETECTED Final   Parainfluenza Virus 2 NOT DETECTED NOT DETECTED Final   Parainfluenza Virus 3 NOT DETECTED NOT DETECTED Final    Parainfluenza Virus 4 NOT DETECTED NOT DETECTED Final   Respiratory Syncytial Virus NOT DETECTED NOT DETECTED Final   Bordetella pertussis NOT DETECTED NOT DETECTED Final   Chlamydophila pneumoniae NOT DETECTED NOT DETECTED Final   Mycoplasma pneumoniae NOT DETECTED NOT DETECTED Final    Comment: Performed at [facility], [place] [address], [place], [place] [address]  C difficile quick scan w PCR reflex     Status: None   Collection Time: 03/21/17  2:23 PM  Result Value Ref Range Status   C Diff antigen NEGATIVE NEGATIVE Final   C Diff toxin NEGATIVE NEGATIVE Final   C Diff interpretation No C. difficile detected.  Final     Time coordinating discharge: Over 30 minutes  SIGNED:   Elmarie Shiley, MD  [facility] 03/26/2017, 8:44 AM Pager   If 7PM-7AM, please contact night-coverage [email or web address] Password [email or web address]

## 2017-03-26 NOTE — Care Management Note (Signed)
Case Management Note  Patient Details  Name: Michele Martin MRN: [id number] Date of Birth: 1946/06/15  Subjective/Objective:                  Iv solu-medrol, nebs and 02  Action/Plan: Date:  March 26, 2017 Chart reviewed for concurrent status and case management needs. Will continue to follow patient progress. Discharge Planning: following for needs Expected discharge date: [id number] Velva Harman, BSN, [place], [place]  Expected Discharge Date:   (unknown)               Expected Discharge Plan:  Home/Self Care  In-House Referral:  [facility]  Discharge planning Services  CM Consult  Post Acute Care Choice:    Choice offered to:     DME Arranged:    DME Agency:     HH Arranged:    [place] Agency:     Status of Service:  In process, will continue to follow  If discussed at Long Length of Stay Meetings, dates discussed:    Additional Comments:  Leeroy Cha, RN 03/26/2017, 8:44 AM

## 2017-03-26 NOTE — Telephone Encounter (Signed)
Done hardcopy to Shirron  

## 2017-03-26 NOTE — Progress Notes (Signed)
Nutrition Follow-up  DOCUMENTATION CODES:   Non-severe (moderate) malnutrition in context of chronic illness  INTERVENTION:  - Continue to encourage PO intakes of meals and supplements. - Continue Ensure Enlive BID.   NUTRITION DIAGNOSIS:   Malnutrition related to chronic illness (COPD) as evidenced by mild depletion of body fat, mild depletion of muscle mass. -ongoing  GOAL:   Patient will meet greater than or equal to 90% of their needs -unsure at this time  MONITOR:   PO intake, Weight trends, Labs, Skin  ASSESSMENT:   71 y.o. female with medical history significant of mild cognitive impairment , hyperlipidemia, COPD, asthma, depression, anxiety, tremor, dCHF, who presents with shortness of breath.  She was admitted to stepdown with COPD exacerbation.  She improved and was transferred to the floor on 8/25, however on 8/26 AM she was found to be in significant respiratory distress requiring transfer back to stepdown  8/30 Discharge order, but not discharge summary, in place at this time. In rounds this AM, Charge RN reported that pt is to d/c home today with home health. No intakes documented since initial RD assessment on 8/26. Ensure Enlive was ordered BID on 8/28 and pt has accepted 2/4 doses since the time of order. Weight has been stable since 8/25.  Medications reviewed; 1 capsule Risaquad/day, 1 tablet B-complex with vitamin C/day, 40 mg oral Lasix/day, 2 g IV Mg sulfate x1 run yesterday, 60 mg Solu-medrol BID. Labs reviewed; Cl: 96 mmol/L.    8/26 - Patient transferred to stepdown unit today d/t respiratory distress.  - Currently NPO.  - PICC placement unsuccessful today. - Pt did eat 75% of a meal yesterday prior to move (~545 kcal, 20g protein).  - Pt would benefit from nutrition supplement once diet is advanced.  - Per chart review, pt has had some weight gain recently.   Nutrition-Focused physical exam completed. Findings are mild fat depletion, mild muscle  depletion, and no edema.    Diet Order:  Diet regular Room service appropriate? Yes; Fluid consistency: Thin Diet - low sodium heart healthy  Skin:   (non-pressure wound on arm)  Last BM:  8/30  Height:   Ht Readings from Last 1 Encounters:  03/20/17 5\' 1"  (1.549 m)    Weight:   Wt Readings from Last 1 Encounters:  03/26/17 113 lb 1.5 oz (51.3 kg)    Ideal Body Weight:  47.7 kg  BMI:  Body mass index is 21.37 kg/m.  Estimated Nutritional Needs:   Kcal:  1550-1750  Protein:  70-80g  Fluid:  1.7L/day  EDUCATION NEEDS:   No education needs identified at this time    Jarome Matin, MS, RD, LDN, CNSC Inpatient Clinical Dietitian Pager # [phone] After hours/weekend pager # [phone]

## 2017-03-26 NOTE — Progress Notes (Signed)
Occupational Therapy Treatment Patient Details Name: Michele Martin MRN: [id number] DOB: 10/25/1945 Today's Date: 03/26/2017    History of present illness Michele Martin is a 71 y.o. female was admitted with  history of asthma/COPD and chronic respiratory failure as well as angle closure glaucoma, allergic rhinitis, sinusitis, osteoarthritis, chronic back pain, anxiety/depression, hyperlipidemia, osteopenia, and tremor who presented to the ED via EMS for multiple falls in the setting of worsening dyspnea.  initially in ICU/step down, transferred to regular floor and returned to icu/step down due to respiratory distress and need for bipap   OT comments  Pt hopes for home today.  Reinforced energy conservation. Pt plans to take a shower once home.  Walked to bathroom during OT session  Follow Up Recommendations  Home health OT    Equipment Recommendations  None recommended by OT    Recommendations for Other Services      Precautions / Restrictions Precautions Precautions: Fall Precaution Comments: watch 02 sats Restrictions Weight Bearing Restrictions: No       Mobility Bed Mobility Overal bed mobility: Modified Independent                Transfers       Sit to Stand: Supervision         General transfer comment: cues for hand placement    Balance                                           ADL either performed or assessed with clinical judgement   ADL       Grooming: Wash/dry hands;Supervision/safety;Standing                   Toilet Transfer: Min guard;Ambulation;Comfort height toilet;RW   Toileting- Water quality scientist and Hygiene: Min guard;Sit to/from stand         General ADL Comments: pt is hoping to go home today. She wants to wait to wash up until she can shower.  Pt needs to wear 02 while she showers. She has this at home. Reviewed energy conservation     Vision       Perception     Praxis       Cognition Arousal/Alertness: Awake/alert Behavior During Therapy: WFL for tasks assessed/performed Overall Cognitive Status: Within Functional Limits for tasks assessed                                 General Comments: per chart, mild cognitive impairment        Exercises     Shoulder Instructions       General Comments      Pertinent Vitals/ Pain       Pain Assessment: No/denies pain  Home Living                                          Prior Functioning/Environment              Frequency           Progress Toward Goals  OT Goals(current goals can now be found in the care plan section)  Progress towards OT goals: Progressing toward goals     Plan  Co-evaluation                 AM-PAC PT "6 Clicks" Daily Activity     Outcome Measure   Help from another person eating meals?: None Help from another person taking care of personal grooming?: A Little Help from another person toileting, which includes using toliet, bedpan, or urinal?: A Little Help from another person bathing (including washing, rinsing, drying)?: A Little Help from another person to put on and taking off regular upper body clothing?: A Little Help from another person to put on and taking off regular lower body clothing?: A Little 6 Click Score: 19    End of Session    OT Visit Diagnosis: Unsteadiness on feet (R26.81)   Activity Tolerance Patient limited by fatigue   Patient Left in chair;with call bell/phone within reach   Nurse Communication          Time: 2993-7169 OT Time Calculation (min): 16 min  Charges: OT General Charges $OT Visit: 1 Visit OT Treatments $Self Care/Home Management : 8-22 mins  Lesle Chris, OTR/L [phone] 03/26/2017   [place] 03/26/2017, 11:06 AM

## 2017-03-27 ENCOUNTER — Other Ambulatory Visit: Payer: Self-pay | Admitting: *Deleted

## 2017-03-27 ENCOUNTER — Other Ambulatory Visit: Payer: Self-pay | Admitting: Internal Medicine

## 2017-03-27 NOTE — Patient Outreach (Signed)
[place] [facility]) Care Management  03/27/2017  TYNETTA BACHMANN [birth year] [id number]    Transition of care call Discharged from [facility] on 8/30 Dx: COPD exacerbation  Spoke with patient explained reason for call,  reports she is feeling some better after resting well last night. Patient report she is still a little weak and using her walker at home.  Patient reports her sister [name] is visiting her now and getting medications and things organized in the home, patients' son will be with her the weekend.  Patient requested her sister review medications, sister discussed getting new  Prescriptions filled, patient has started on prednisone  today and doxycyline sister has helping with filling her pill organizer. Sister discussed patient at times will forget to take one of her inhaler, if she takes breo she may forget to take incruse . Sister reports patient is taking nebulizer medication and has not required rescue inhaler since being at home.  Patient's today weight is 105.8, reports patient had some swelling in ankles yesterday but is down on today, keeping legs elevated while resting on couch.   Patient reports she has not heard from [facility] home [facility] yet, they were visiting her prior to admission .  Discussed PCP follow up visit, offered to call for appointment in next week, patient sister [name] states she will call for office visit.   Plan Placed call to [facility] spoke with Jinny Blossom , who states they received resumption of home health orders on today, and will contact patient in the next 24 to 48 hours, updated patient to expect a cal.  Reviewed transition of care program, informed of Linton Flemings care manager will follow up with her in next week.   THN CM Care Plan Problem One     Most Recent Value  Care Plan Problem One  Recent admission for COPD exacerbation  Role Documenting the Problem One  Care Management [place] for  Problem One  Active  THN Long Term Goal   Patient will report no readmission for COPD for the next 60 days.   THN Long Term Goal Start Date  03/27/17  Interventions for Problem One Long Term Goal  Reviewed transition of care program, reviewed discharge instructions, advised regarding call MD for worsening symptoms of shortness of breath, swelling.   THN CM Short Term Goal #1   Patient will take all medications as prescribed in the next 30 days.   THN CM Short Term Goal #1 Start Date  02/27/17  Interventions for Short Term Goal #1  Reviewed importance of taking all medication as prescribed, completing doses of antibiotics , and prednisone as prescribed.   THN CM Short Term Goal #2   Patient will schedule and attend PCP office visit in the next 7 days   THN CM Short Term Goal #2 Start Date  03/27/17  Interventions for Short Term Goal #2  Reviewed importance of attending follow up appointments. Confirmed transportation.       Joylene Draft, RN, [place] Management Coordinator  [phone]- Mobile [phone]- Toll Free Main Office

## 2017-03-28 DIAGNOSIS — G4733 Obstructive sleep apnea (adult) (pediatric): Secondary | ICD-10-CM | POA: Diagnosis not present

## 2017-03-28 DIAGNOSIS — Z9981 Dependence on supplemental oxygen: Secondary | ICD-10-CM | POA: Diagnosis not present

## 2017-03-28 DIAGNOSIS — M810 Age-related osteoporosis without current pathological fracture: Secondary | ICD-10-CM | POA: Diagnosis not present

## 2017-03-28 DIAGNOSIS — I5033 Acute on chronic diastolic (congestive) heart failure: Secondary | ICD-10-CM | POA: Diagnosis not present

## 2017-03-28 DIAGNOSIS — Z87440 Personal history of urinary (tract) infections: Secondary | ICD-10-CM | POA: Diagnosis not present

## 2017-03-28 DIAGNOSIS — J441 Chronic obstructive pulmonary disease with (acute) exacerbation: Secondary | ICD-10-CM | POA: Diagnosis not present

## 2017-03-28 DIAGNOSIS — Z7982 Long term (current) use of aspirin: Secondary | ICD-10-CM | POA: Diagnosis not present

## 2017-03-31 ENCOUNTER — Telehealth: Payer: Self-pay | Admitting: Pulmonary Disease

## 2017-03-31 ENCOUNTER — Telehealth: Payer: Self-pay | Admitting: Internal Medicine

## 2017-03-31 DIAGNOSIS — Z87440 Personal history of urinary (tract) infections: Secondary | ICD-10-CM | POA: Diagnosis not present

## 2017-03-31 DIAGNOSIS — I5033 Acute on chronic diastolic (congestive) heart failure: Secondary | ICD-10-CM | POA: Diagnosis not present

## 2017-03-31 DIAGNOSIS — J441 Chronic obstructive pulmonary disease with (acute) exacerbation: Secondary | ICD-10-CM | POA: Diagnosis not present

## 2017-03-31 DIAGNOSIS — M810 Age-related osteoporosis without current pathological fracture: Secondary | ICD-10-CM | POA: Diagnosis not present

## 2017-03-31 DIAGNOSIS — G4733 Obstructive sleep apnea (adult) (pediatric): Secondary | ICD-10-CM | POA: Diagnosis not present

## 2017-03-31 DIAGNOSIS — Z9981 Dependence on supplemental oxygen: Secondary | ICD-10-CM | POA: Diagnosis not present

## 2017-03-31 DIAGNOSIS — Z7982 Long term (current) use of aspirin: Secondary | ICD-10-CM | POA: Diagnosis not present

## 2017-03-31 NOTE — Telephone Encounter (Signed)
Called and spoke with Michele Martin and she stated that the pt did refill the meds and they were very expensive---she stated that the pt is wanting to go back to the symbicort due the cost of these meds.  Michele Martin stated that the  Pt wanted to see if PM wanted her to change back to the symbicort/spiriva combo

## 2017-03-31 NOTE — Telephone Encounter (Signed)
Can she check with her insurance what are covered? If trelegy is covered then we can use that.  Otherwise switch back to symbicort/spriva combo.  Marshell Garfinkel MD  Pulmonary and Critical Care 03/31/2017, 5:17 PM

## 2017-03-31 NOTE — Telephone Encounter (Signed)
Estill Bamberg needs verbals for skilled nursing  3week 1 2week 1 1week1 For observation and assessment for acute on chronic respiratory failure  Would also an order for a Education officer, museum.

## 2017-03-31 NOTE — Telephone Encounter (Signed)
Ok for verbals 

## 2017-03-31 NOTE — Telephone Encounter (Signed)
Spoke with Estill Bamberg to make aware of PM's recs. She will check with insurance and call us back to keep [place] updated.   Will await call.

## 2017-03-31 NOTE — Telephone Encounter (Signed)
Seen pt for resumption of care.  Requesting verbal to continue care for two times a week for three weeks for strength, gate safety and fall prevention.

## 2017-04-01 ENCOUNTER — Other Ambulatory Visit: Payer: Self-pay | Admitting: Internal Medicine

## 2017-04-01 ENCOUNTER — Other Ambulatory Visit: Payer: Self-pay | Admitting: Pharmacist

## 2017-04-01 NOTE — Telephone Encounter (Signed)
Klonopin too soon since was just done aug 30

## 2017-04-01 NOTE — Telephone Encounter (Signed)
Notified [name] w/MD response...[name]

## 2017-04-01 NOTE — Telephone Encounter (Signed)
Notified David w/MD response...Michele Martin

## 2017-04-01 NOTE — Patient Outreach (Signed)
Call to follow up with Michele Martin regarding medication management and medication assistance. Note that patient was in the hospital from 03/20/2017 to 03/26/2017. Speak with patient. HIPAA identifiers verified and verbal consent received.  Patient reports that she is doing "so-so". Reports that she cannot find any of the patient assistance program applications that Pharmacy Technician Architectural technologist mailed to her. Patient asks to receive these again. Patient states that she would be interested in having someone come to the home to complete these applications with her in person. Patient reports that she has not yet received a response back from [employer] about her application for extra help. Reports that she is going to call Social Security about having this letter resent.   Let patient know that I would like to complete a medication review with her as she was recently discharged from the hospital. Patient reports that she is tired right now, but asks that I call back on Friday, 9/7 at 2 pm to complete this review. Will call patient back at that time.  PLAN  1) Will send a message to Pharmacy Technician [place] asking that she contact the patient about completing the patient assistance applications with her in person.  2) Will call to complete a medication review with the patient on 9/7 at 2 pm  3) Patient to follow up with Social Security about a response to her Extra Help application.   Harlow Asa, PharmD, [place] Management [phone]

## 2017-04-02 ENCOUNTER — Ambulatory Visit: Payer: Self-pay

## 2017-04-02 DIAGNOSIS — I5033 Acute on chronic diastolic (congestive) heart failure: Secondary | ICD-10-CM | POA: Diagnosis not present

## 2017-04-02 DIAGNOSIS — M810 Age-related osteoporosis without current pathological fracture: Secondary | ICD-10-CM | POA: Diagnosis not present

## 2017-04-02 DIAGNOSIS — Z7982 Long term (current) use of aspirin: Secondary | ICD-10-CM | POA: Diagnosis not present

## 2017-04-02 DIAGNOSIS — Z9981 Dependence on supplemental oxygen: Secondary | ICD-10-CM | POA: Diagnosis not present

## 2017-04-02 DIAGNOSIS — G4733 Obstructive sleep apnea (adult) (pediatric): Secondary | ICD-10-CM | POA: Diagnosis not present

## 2017-04-02 DIAGNOSIS — J441 Chronic obstructive pulmonary disease with (acute) exacerbation: Secondary | ICD-10-CM | POA: Diagnosis not present

## 2017-04-02 DIAGNOSIS — Z87440 Personal history of urinary (tract) infections: Secondary | ICD-10-CM | POA: Diagnosis not present

## 2017-04-03 ENCOUNTER — Other Ambulatory Visit: Payer: Self-pay | Admitting: Pharmacist

## 2017-04-03 ENCOUNTER — Other Ambulatory Visit: Payer: Self-pay | Admitting: Pharmacy Technician

## 2017-04-03 ENCOUNTER — Telehealth: Payer: Self-pay | Admitting: Internal Medicine

## 2017-04-03 ENCOUNTER — Ambulatory Visit: Payer: Self-pay | Admitting: Pharmacist

## 2017-04-03 ENCOUNTER — Other Ambulatory Visit: Payer: Self-pay

## 2017-04-03 DIAGNOSIS — I5033 Acute on chronic diastolic (congestive) heart failure: Secondary | ICD-10-CM | POA: Diagnosis not present

## 2017-04-03 DIAGNOSIS — Z9981 Dependence on supplemental oxygen: Secondary | ICD-10-CM | POA: Diagnosis not present

## 2017-04-03 DIAGNOSIS — G4733 Obstructive sleep apnea (adult) (pediatric): Secondary | ICD-10-CM | POA: Diagnosis not present

## 2017-04-03 DIAGNOSIS — M810 Age-related osteoporosis without current pathological fracture: Secondary | ICD-10-CM | POA: Diagnosis not present

## 2017-04-03 DIAGNOSIS — J441 Chronic obstructive pulmonary disease with (acute) exacerbation: Secondary | ICD-10-CM | POA: Diagnosis not present

## 2017-04-03 DIAGNOSIS — Z7982 Long term (current) use of aspirin: Secondary | ICD-10-CM | POA: Diagnosis not present

## 2017-04-03 DIAGNOSIS — Z87440 Personal history of urinary (tract) infections: Secondary | ICD-10-CM | POA: Diagnosis not present

## 2017-04-03 NOTE — Patient Outreach (Signed)
Receive a call from patient's sister, [name]. [name] reports that she just received a call from the patient asking her to call me back in response to my voicemail. Schedule to call patient again on Monday, 04/06/17 at 11 am when [place] will also be home with the patient.  Harlow Asa, PharmD, [place] Management [phone]

## 2017-04-03 NOTE — Patient Outreach (Signed)
Call patient, as scheduled, to complete a post-discharge medication review. Left a HIPAA compliant message on the patient's voicemail. If have not heard from patient by next week, will give her another call at that time.  Harlow Asa, PharmD, [place] Management [phone]

## 2017-04-03 NOTE — Telephone Encounter (Signed)
Geni Bers Lobbyist) for [facility] called stating that she was not able to complete the evaluation this week and would like verbal orders to be able to move the evaluation to next week.

## 2017-04-03 NOTE — Telephone Encounter (Signed)
That would be fine. Dr. Jenny Reichmann do you agree?

## 2017-04-03 NOTE — Patient Outreach (Signed)
Transition of care: Placed call to patient for transition of care. Patient answered and reports she is weak.  Reports that she is taking her medications as prescribed.  States she is using her inhalers.   Denies any new issues today.  Reports that her sister [name] is with her.     Offered home visit to reassess current status.  PLAN:home visit on next business day on 04/06/2017 at [place], RN, BSN, CEN [facility] [employer] [phone]

## 2017-04-03 NOTE — Patient Outreach (Signed)
[facility] [facility]) Care Management  04/03/2017  Michele Martin 03/09/1946 [id number]   Patient was recently admitted to the hospital and now that she's home required assistance filling out her patient assistance application's. Due to the patient not being able to find the application's that I mailed I reprinted them and filled in the information that I had available and helped her to complete the rest. I was also able to obtain all of the required documentation that is required. I faxed the [employer] application today for Breo and Incruse to the company and I will fax [employer] for [place] once I receive the provider portion back from Dr. Jenny Reichmann.  [name], [place] [phone]

## 2017-04-03 NOTE — Telephone Encounter (Signed)
LMTCB for [employer]

## 2017-04-06 ENCOUNTER — Telehealth: Payer: Self-pay | Admitting: Pulmonary Disease

## 2017-04-06 ENCOUNTER — Telehealth: Payer: Self-pay | Admitting: Internal Medicine

## 2017-04-06 ENCOUNTER — Other Ambulatory Visit: Payer: Self-pay

## 2017-04-06 ENCOUNTER — Ambulatory Visit: Payer: Self-pay | Admitting: Pharmacist

## 2017-04-06 ENCOUNTER — Other Ambulatory Visit: Payer: Self-pay | Admitting: Pharmacist

## 2017-04-06 DIAGNOSIS — Z7982 Long term (current) use of aspirin: Secondary | ICD-10-CM | POA: Diagnosis not present

## 2017-04-06 DIAGNOSIS — Z87440 Personal history of urinary (tract) infections: Secondary | ICD-10-CM | POA: Diagnosis not present

## 2017-04-06 DIAGNOSIS — M810 Age-related osteoporosis without current pathological fracture: Secondary | ICD-10-CM | POA: Diagnosis not present

## 2017-04-06 DIAGNOSIS — J449 Chronic obstructive pulmonary disease, unspecified: Secondary | ICD-10-CM

## 2017-04-06 DIAGNOSIS — Z9981 Dependence on supplemental oxygen: Secondary | ICD-10-CM | POA: Diagnosis not present

## 2017-04-06 DIAGNOSIS — G4733 Obstructive sleep apnea (adult) (pediatric): Secondary | ICD-10-CM | POA: Diagnosis not present

## 2017-04-06 DIAGNOSIS — J441 Chronic obstructive pulmonary disease with (acute) exacerbation: Secondary | ICD-10-CM | POA: Diagnosis not present

## 2017-04-06 DIAGNOSIS — R531 Weakness: Secondary | ICD-10-CM | POA: Diagnosis not present

## 2017-04-06 DIAGNOSIS — I5033 Acute on chronic diastolic (congestive) heart failure: Secondary | ICD-10-CM | POA: Diagnosis not present

## 2017-04-06 MED ORDER — FLUTICASONE FUROATE-VILANTEROL 200-25 MCG/INH IN AEPB: 1.0000 | INHALATION_SPRAY | Freq: Every day | RESPIRATORY_TRACT | 0 refills | Status: AC

## 2017-04-06 NOTE — Telephone Encounter (Signed)
Estill Bamberg from [facility] called wanting to discuss safety concerns and abnormal findings from her recent visit with the pt. She can be reached at [phone].

## 2017-04-06 NOTE — Telephone Encounter (Signed)
Please see 03/31/17 phone note.   Pt's sister, [name] is aware that samples have been placed up front for pick up. Nothing further needed.

## 2017-04-06 NOTE — Patient Outreach (Addendum)
[facility]) Care Management  P[place]   04/06/2017  Michele Martin 03-11-1946 [id number]  Subjective: Call to perform a medication review as scheduled. Note that per EPIC patient was in the hospital from 03/20/17 to 03/26/17 for acute on chronic respiratory failure with hypoxia and hypercapnia. Speak with patient. HIPAA identifiers verified and verbal consent received. Patient asks that I speak with her sister/caregiver, [name], regarding her medications.  Review with [name] each of the patient's medications including name, dose, administration and indication. Reports that the patient has not been doing well today and is currently meeting with a physical therapist and that her nurse from home heath is coming shortly. Reports that she and the patient's provider have been working on getting the patient 24-hour care. Reports that she stays with the patient during the day and into the evening and that her granddaughter comes in the morning. Reports that the patient takes her medications from pillpacks that have been supplied by [facility]. Reports that they are transitioning over the [facility] now.  Lorriane reports that the patient has 4 puffs of her Breo left and has just run out of doses on the Incruse inhaler. Counsel on the importance of adherence to these inhalers. Call patient's Pulmonologist and leave a message requesting samples of Breo and Incruse to last the patient until she can receive the medication from the patient assistance program. Request follow up call to the patient and her caregiver.  Reports that the patient's blood pressure has been low. Reports that she is concerned about the patient falling even though the patient is using her walker. Reports that she has called the patient's Cardiologist to report the lows and request an appointment, scheduled to see Cardiology on 04/20/17. Counseled to keep a blood pressure log for the patient to bring  with her to medical appointments. Counsel to call patient's Cardiologist to report these readings, even if prior to upcoming appointment, for advise on management.  Counsel regarding possible contribution to fall risk with CNS depressant medications such as clonazepam, gabapentin and tramadol. Reports that patient is not taking tramadol. As review medications with caregiver, note that clonazepam direction is for patient to take this medication on an as needed basis, but medication is being put in pill packaging. [name] reports that she has been taking the clonazepam out and giving it only as needed, but is going to call [facility] today to request that this no longer be included in the packaging as it is an as needed medication. Reports that she and the patient are not sure about why she was started on gabapentin. Reports that the patient has an appointment with her PCP on Wednesday and will discuss with Dr. Jenny Reichmann whether the gabapentin can be discontinued.  Note that [facility] RNCM Tomasa Rand is scheduled to see patient in her home today at 2 pm. Will place a coordination of care call to [place] now.   Objective:   Encounter Medications: Outpatient Encounter Prescriptions as of 04/06/2017  Medication Sig Note  . albuterol (PROVENTIL HFA) 108 (90 Base) MCG/ACT inhaler Inhale 2 puffs into the lungs every 6 (six) hours as needed for wheezing or shortness of breath.   [name] albuterol (PROVENTIL) (2.5 MG/3ML) 0.083% nebulizer solution INHALE 1 VIAL VIA NEBULIZER EVERY 6 HOURS AS NEEDED FOR WHEEZING OR FOR SHORTNESS OF BREATH   . aspirin EC 81 MG tablet Take 1 tablet (81 mg total) by mouth at bedtime.   [name] atorvastatin (LIPITOR) 20 MG tablet  Take 1 tablet (20 mg total) by mouth at bedtime.   [name] b complex vitamins tablet Take 1 tablet by mouth at bedtime.   . clonazePAM (KLONOPIN) 0.5 MG tablet TAKE ONE (1) TABLET BY MOUTH TWICE DAILY AS NEEDED FOR ANXIETY 04/06/2017: Takes 1 tablet by mouth once daily  .  donepezil (ARICEPT) 10 MG tablet Take 1 tablet (10 mg total) by mouth at bedtime.   [name] escitalopram (LEXAPRO) 20 MG tablet Take 0.5 tablets (10 mg total) by mouth daily.   . feeding supplement, ENSURE ENLIVE, (ENSURE ENLIVE) LIQD Take 237 mLs by mouth 2 (two) times daily between meals.   . fluticasone furoate-vilanterol (BREO ELLIPTA) 200-25 MCG/INH AEPB Inhale 1 puff into the lungs daily.   . furosemide (LASIX) 40 MG tablet Take 1 tablet (40 mg total) by mouth daily.   [name] gabapentin (NEURONTIN) 100 MG capsule TAKE 1 CAPSULE BY MOUTH THREE TIMES DAILY 03/20/2017: Pt had 1 dose   . guaiFENesin (MUCINEX) 600 MG 12 hr tablet Take 1 tablet (600 mg total) by mouth 2 (two) times daily.   . montelukast (SINGULAIR) 10 MG tablet TAKE 1 TABLET BY MOUTH EVERY NIGHT AT BEDTIME   . potassium chloride (K-DUR) 10 MEQ tablet Take 1 tablet (10 mEq total) by mouth daily.   . Probiotic CAPS Take 1 capsule by mouth every day   . roflumilast (DALIRESP) 500 MCG TABS tablet Take 1 tablet (500 mcg total) by mouth daily.   . traMADol (ULTRAM) 50 MG tablet Take 1 tablet (50 mg total) by mouth every 8 (eight) hours as needed. (Patient not taking: Reported on 04/06/2017)   . umeclidinium bromide (INCRUSE ELLIPTA) 62.5 MCG/INH AEPB Inhale 1 puff into the lungs daily. (Patient not taking: Reported on 04/06/2017)   . [DISCONTINUED] albuterol (PROVENTIL) (2.5 MG/3ML) 0.083% nebulizer solution Take 3 mLs (2.5 mg total) by nebulization every 6 (six) hours.   . [DISCONTINUED] doxycycline (VIBRA-TABS) 100 MG tablet Take 1 tablet (100 mg total) by mouth every 12 (twelve) hours.   . [DISCONTINUED] predniSONE (DELTASONE) 20 MG tablet Take 3 tablets for 3 days, then 2 tablets for 4 days then 1 tablet for 2 days.    No facility-administered encounter medications on file as of 04/06/2017.      Assessment:  Patient was recently discharged from hospital and all medications have been reviewed.  Drugs sorted by  system:  Neurologic/Psychologic: clonazepam, Aricept, Lexapro  Cardiovascular: aspirin, atorvastatin, furosemide  Pulmonary/Allergy: albuterol inhaler, albuterol nebulizer solution, Breo, Incruse, Mucinex, Singulair, Daliresp  Pain: gabapentin  Vitamins/Minerals: b-complex vitamins, potassium chloride    Duplications in therapy: none noted Medications to avoid in the elderly: gabapentin, clonazepam; caregiver counseled and to follow up with patient's providers Drug interactions: no significant interactions noted   Plan:  1) Will place coordination of care call to [place] 2) If she does not hear back, caregiver to call patient's Pulmonologist to request samples of Breo and Incruse to last the patient until she can receive the medication from the patient assistance program. 3) Caregiver to follow up with [facility] about not including medication to be taken "as needed" in pill packs.  4) Patient and caregiver to have appointment with patient's PCP on Wednesday. 5) Will call to follow up with patient and caregiver on 04/10/17, as requested.  Harlow Asa, PharmD, [facility] [phone]

## 2017-04-06 NOTE — Telephone Encounter (Signed)
lmtcb for [employer].

## 2017-04-06 NOTE — Telephone Encounter (Addendum)
[facility] pharm  Calling wanting to know if we have samples of incruse and or breo pt assist paperwork sent call Benjamine Mola @ [phone].Hillery Hunter

## 2017-04-06 NOTE — Telephone Encounter (Signed)
Routing to dr john---not sure if this patient qualifies for any services from hospice---please advise, thanks

## 2017-04-06 NOTE — Patient Outreach (Signed)
[name]) Care Management   04/06/2017  Michele Martin Jul 10, 1946 [id number]  Michele Martin is an 71 y.o. female Transition of care home visit.  Sister Michele Martin is present and patient has given permission to talk and get input from sister.  Subjective:  Recent hospital admission for COPD and low blood pressure.  Patient reports no potassium and fluid overload.  Patient reports that she feels pretty good.  Just weak legs.  Reports she fell yesterday for unknown reason. Reports skin tears. Patient states that she knows she is getting worse and does not want to go to a nursing home. Patients reports that she plans to live in her home until she dies. She reports that she wants to be with her dog.  Reports her granddaughter comes daily and now her sister is trying to come 4 times per day. Son lives in [place].  Patient reports that she knows she needs to do something.  Sister reports home heakth nurse in to see patient today.  Objective:  Looks thin.  This is the first visit with this patient that she did not have the energy to get off the couch. Barely felt like sitting during visit. She sat on the couch rubbing her 62 year old dog.   Vitals:   04/06/17 1435  BP: (!) 100/50  Pulse: (!) 114  Resp: 18  SpO2: 99%  Weight: 95 lb (43.1 kg)  Skin tear/ abrasion to the left shoulder.  Skin tear to the right upper arm with a tegaderm applied. Right wrist with skin tear.  Right lower leg and Right knee with skin tears and dressings  applied by sister.    Review of Systems  HENT: Negative.   Eyes: Negative.   Respiratory: Positive for wheezing.   Cardiovascular: Negative.   Gastrointestinal: Negative.   Genitourinary: Positive for frequency.  Musculoskeletal: Positive for falls.  Skin:       Skin tears from falls  Neurological: Positive for weakness.  Endo/Heme/Allergies: Bruises/bleeds easily.  Psychiatric/Behavioral: Negative.     Physical Exam  Constitutional:  She is oriented to person, place, and time.  Thin appearing with bruised skin all over  Cardiovascular: Normal rate, normal heart sounds and intact distal pulses.   Respiratory: Effort normal and breath sounds normal.  GI: Soft. Bowel sounds are normal.  Musculoskeletal: Normal range of motion. She exhibits no edema.  Neurological: She is alert and oriented to person, place, and time.  Skin: Skin is warm and dry.  See note about skin tears  Psychiatric: She has a normal mood and affect. Her behavior is normal. Judgment and thought content normal.    Encounter Medications:   Outpatient Encounter Prescriptions as of 04/06/2017  Medication Sig Note  . albuterol (PROVENTIL HFA) 108 (90 Base) MCG/ACT inhaler Inhale 2 puffs into the lungs every 6 (six) hours as needed for wheezing or shortness of breath.   [name] albuterol (PROVENTIL) (2.5 MG/3ML) 0.083% nebulizer solution INHALE 1 VIAL VIA NEBULIZER EVERY 6 HOURS AS NEEDED FOR WHEEZING OR FOR SHORTNESS OF BREATH   . aspirin EC 81 MG tablet Take 1 tablet (81 mg total) by mouth at bedtime.   [name] atorvastatin (LIPITOR) 20 MG tablet Take 1 tablet (20 mg total) by mouth at bedtime.   [name] b complex vitamins tablet Take 1 tablet by mouth at bedtime.   . clonazePAM (KLONOPIN) 0.5 MG tablet TAKE ONE (1) TABLET BY MOUTH TWICE DAILY AS NEEDED FOR ANXIETY 04/06/2017: Takes 1 tablet by  mouth once daily  . donepezil (ARICEPT) 10 MG tablet Take 1 tablet (10 mg total) by mouth at bedtime.   [name] escitalopram (LEXAPRO) 20 MG tablet Take 0.5 tablets (10 mg total) by mouth daily.   . feeding supplement, ENSURE ENLIVE, (ENSURE ENLIVE) LIQD Take 237 mLs by mouth 2 (two) times daily between meals.   . fluticasone furoate-vilanterol (BREO ELLIPTA) 200-25 MCG/INH AEPB Inhale 1 puff into the lungs daily.   . fluticasone furoate-vilanterol (BREO ELLIPTA) 200-25 MCG/INH AEPB Inhale 1 puff into the lungs daily.   . furosemide (LASIX) 40 MG tablet Take 1 tablet (40 mg total) by mouth  daily.   [name] gabapentin (NEURONTIN) 100 MG capsule TAKE 1 CAPSULE BY MOUTH THREE TIMES DAILY 03/20/2017: Pt had 1 dose   . guaiFENesin (MUCINEX) 600 MG 12 hr tablet Take 1 tablet (600 mg total) by mouth 2 (two) times daily.   . montelukast (SINGULAIR) 10 MG tablet TAKE 1 TABLET BY MOUTH EVERY NIGHT AT BEDTIME   . potassium chloride (K-DUR) 10 MEQ tablet Take 1 tablet (10 mEq total) by mouth daily.   . Probiotic CAPS Take 1 capsule by mouth every day   . roflumilast (DALIRESP) 500 MCG TABS tablet Take 1 tablet (500 mcg total) by mouth daily.   . traMADol (ULTRAM) 50 MG tablet Take 1 tablet (50 mg total) by mouth every 8 (eight) hours as needed. (Patient not taking: Reported on 04/06/2017)   . umeclidinium bromide (INCRUSE ELLIPTA) 62.5 MCG/INH AEPB Inhale 1 puff into the lungs daily. (Patient not taking: Reported on 04/06/2017)    No facility-administered encounter medications on file as of 04/06/2017.     Functional Status:   In your present state of health, do you have any difficulty performing the following activities: 03/21/2017 03/20/2017  Hearing? N N  Vision? N N  Difficulty concentrating or making decisions? N Y  Walking or climbing stairs? N Y  Comment - -  Dressing or bathing? N N  Doing errands, shopping? N -  Preparing Food and eating ? - -  Using the Toilet? - -  In the past six months, have you accidently leaked urine? - -  Do you have problems with loss of bowel control? - -  Managing your Medications? - -  Comment - -  Managing your Finances? - -  Housekeeping or managing your Housekeeping? - -  Some recent data might be hidden    Fall/Depression Screening:    Fall Risk  03/09/2017 12/18/2016 12/17/2016  Falls in the past year? Yes No No  Number falls in past yr: 1 - -  Injury with Fall? Yes - -  Risk Factor Category  - - -  Risk for fall due to : History of fall(s) - -  Follow up Falls prevention discussed - -   PHQ 2/9 Scores 03/09/2017 12/18/2016 11/20/2016 09/30/2016  09/30/2016 09/22/2016 08/21/2016  PHQ - 2 Score 6 6 0 2 1 0 0  PHQ- 9 Score 9 13 - 6 - - -    Assessment:   (1) reports 8 pound weight loss in 1 week.  Decreased appetite.  (2) fall yesterday, patient does not know why she fell.  Multiple skin tears to arms and legs. Currently using walker and non slip socks.  (3) feels like things are getting worse. Wants to remain in her home.  Declines wanting to go to a nursing facility.  (4) interested in care giving service or palliative care/hospice.  (5) COPD:  Reports  breathing is about the same.  On 2.5 liters nasal canula 24 hours per day.  (6) medications reviewed by [facility] pharmacist earlier today.  (7) feels weak (8) pending primary MD appointment on 04/08/2017. Sister concerned about how to get patient to the office.  Plan:  (1) encouraged patient to eat and drink as much as possible. Encouraged patient to follow safety measures.  (2) reviewed safety concerns. Placed call to MD office to inform of current condition. (3) Reviewed with patient options of in home private pay caregivers. Reviewed hospice and palliative care services.  Reviewed with patient her wishes and will attempt to help patient stay in her home.  Placed call to MD office and left a message for primary MD. (4) see above (5) encouraged patient to take her medications as prescribed. (6) out of Incruz.  Sister calling pulmonary about medications.  (7) encouraged good nutrition and encouraged ensure supplements. (8) informed MD of concern.  After response from MD about palliative/ hospice referral will attempt to come up with plan to get patient to MD office if MD thinks it is necessary.   Care planning and goal setting. Patients goal is to stay in her home.  Tomasa Rand, RN, BSN, CEN [facility] [employer] [phone]

## 2017-04-06 NOTE — Telephone Encounter (Signed)
First message place may be in error. Elizabeth from [facility] called to clarify message because she noticed message was wrong. Elizabeth request for patient to have two samples of Breo and Incruse until she is approved for patient assistance. Elizabeth instructed for the nurse to call patient's sister Rennis Golden to follow up with samples [phone]. Elizabeth's callback number is [phone] but she doesn't request for anyone to call her back-AJ

## 2017-04-06 NOTE — Telephone Encounter (Signed)
Geni Bers called back checking on this verbal order. Please advise.

## 2017-04-06 NOTE — Telephone Encounter (Signed)
Routing to dr Edwin Dada you suggest office visit before I call home care back---or what is the preference for care at this point---please advise, I will call home health back, thanks

## 2017-04-06 NOTE — Telephone Encounter (Signed)
Estill Bamberg from [facility] called stating that she is out with the pt and is concerned. She said that the pt has lost 8 pounds in one week, has multiple skin tears and fell yesterday but does have a reason why she fell. (See Patient Outreach Note) Estill Bamberg said that she spoke with the pt and she does not want to go to a nursing facility. She did talk to her about hospice and palliative care. Estill Bamberg thinks that this may be the best option for her so that she can continue to stay at home as long as she can. The pt has agreed to look into this service to see if she would qualify. Estill Bamberg would like to know if an order can be sent over for a hospice and palliative evaluation. If not, Estill Bamberg will work with their social workers to see what other plan they can come up with to keep her safe. Estill Bamberg can be reached at [phone].  ** She also said that they are trying to figure out how they are going to get her to her appointment on Wednesday. She said that the pt has 3 steps going out of house and 3 more from the porch to the driveway.

## 2017-04-06 NOTE — Telephone Encounter (Signed)
Spoke with Benjamine Mola at [facility]. She advised me to call patient's sister [name] to advise that we do have samples and that she can stop to pick up them up.   Left detailed message for [name] advising her that the samples are up front for pickup.

## 2017-04-07 ENCOUNTER — Encounter (HOSPITAL_COMMUNITY): Payer: Self-pay

## 2017-04-07 ENCOUNTER — Other Ambulatory Visit: Payer: Self-pay

## 2017-04-07 ENCOUNTER — Emergency Department (HOSPITAL_COMMUNITY): Payer: Medicare Other

## 2017-04-07 ENCOUNTER — Inpatient Hospital Stay (HOSPITAL_COMMUNITY)
Admission: EM | Admit: 2017-04-07 | Discharge: 2017-04-09 | DRG: 640 | Disposition: A | Discharge status: Hospice | Payer: Medicare Other | Attending: Internal Medicine | Admitting: Internal Medicine

## 2017-04-07 DIAGNOSIS — G3184 Mild cognitive impairment, so stated: Secondary | ICD-10-CM | POA: Diagnosis not present

## 2017-04-07 DIAGNOSIS — J441 Chronic obstructive pulmonary disease with (acute) exacerbation: Secondary | ICD-10-CM | POA: Diagnosis present

## 2017-04-07 DIAGNOSIS — E876 Hypokalemia: Principal | ICD-10-CM | POA: Diagnosis present

## 2017-04-07 DIAGNOSIS — G4733 Obstructive sleep apnea (adult) (pediatric): Secondary | ICD-10-CM | POA: Diagnosis not present

## 2017-04-07 DIAGNOSIS — L8990 Pressure ulcer of unspecified site, unspecified stage: Secondary | ICD-10-CM | POA: Insufficient documentation

## 2017-04-07 DIAGNOSIS — E861 Hypovolemia: Secondary | ICD-10-CM | POA: Diagnosis not present

## 2017-04-07 DIAGNOSIS — I5032 Chronic diastolic (congestive) heart failure: Secondary | ICD-10-CM | POA: Diagnosis not present

## 2017-04-07 DIAGNOSIS — Z7982 Long term (current) use of aspirin: Secondary | ICD-10-CM

## 2017-04-07 DIAGNOSIS — G252 Other specified forms of tremor: Secondary | ICD-10-CM | POA: Diagnosis present

## 2017-04-07 DIAGNOSIS — R296 Repeated falls: Secondary | ICD-10-CM | POA: Diagnosis not present

## 2017-04-07 DIAGNOSIS — R404 Transient alteration of awareness: Secondary | ICD-10-CM | POA: Diagnosis not present

## 2017-04-07 DIAGNOSIS — T502X5A Adverse effect of carbonic-anhydrase inhibitors, benzothiadiazides and other diuretics, initial encounter: Secondary | ICD-10-CM | POA: Diagnosis not present

## 2017-04-07 DIAGNOSIS — Z515 Encounter for palliative care: Secondary | ICD-10-CM | POA: Diagnosis not present

## 2017-04-07 DIAGNOSIS — J42 Unspecified chronic bronchitis: Secondary | ICD-10-CM | POA: Diagnosis not present

## 2017-04-07 DIAGNOSIS — I5033 Acute on chronic diastolic (congestive) heart failure: Secondary | ICD-10-CM | POA: Diagnosis not present

## 2017-04-07 DIAGNOSIS — Z9981 Dependence on supplemental oxygen: Secondary | ICD-10-CM | POA: Diagnosis not present

## 2017-04-07 DIAGNOSIS — E785 Hyperlipidemia, unspecified: Secondary | ICD-10-CM | POA: Diagnosis not present

## 2017-04-07 DIAGNOSIS — Z888 Allergy status to other drugs, medicaments and biological substances status: Secondary | ICD-10-CM

## 2017-04-07 DIAGNOSIS — R531 Weakness: Secondary | ICD-10-CM | POA: Diagnosis not present

## 2017-04-07 DIAGNOSIS — Z7951 Long term (current) use of inhaled steroids: Secondary | ICD-10-CM

## 2017-04-07 DIAGNOSIS — L89109 Pressure ulcer of unspecified part of back, unspecified stage: Secondary | ICD-10-CM | POA: Diagnosis not present

## 2017-04-07 DIAGNOSIS — G9341 Metabolic encephalopathy: Secondary | ICD-10-CM | POA: Diagnosis not present

## 2017-04-07 DIAGNOSIS — I4581 Long QT syndrome: Secondary | ICD-10-CM | POA: Diagnosis not present

## 2017-04-07 DIAGNOSIS — F329 Major depressive disorder, single episode, unspecified: Secondary | ICD-10-CM | POA: Diagnosis present

## 2017-04-07 DIAGNOSIS — J9600 Acute respiratory failure, unspecified whether with hypoxia or hypercapnia: Secondary | ICD-10-CM | POA: Diagnosis present

## 2017-04-07 DIAGNOSIS — Z87891 Personal history of nicotine dependence: Secondary | ICD-10-CM

## 2017-04-07 DIAGNOSIS — R7989 Other specified abnormal findings of blood chemistry: Secondary | ICD-10-CM

## 2017-04-07 DIAGNOSIS — R55 Syncope and collapse: Secondary | ICD-10-CM | POA: Diagnosis present

## 2017-04-07 DIAGNOSIS — M8580 Other specified disorders of bone density and structure, unspecified site: Secondary | ICD-10-CM | POA: Diagnosis not present

## 2017-04-07 DIAGNOSIS — I5030 Unspecified diastolic (congestive) heart failure: Secondary | ICD-10-CM | POA: Diagnosis not present

## 2017-04-07 DIAGNOSIS — M810 Age-related osteoporosis without current pathological fracture: Secondary | ICD-10-CM | POA: Diagnosis not present

## 2017-04-07 DIAGNOSIS — F0390 Unspecified dementia without behavioral disturbance: Secondary | ICD-10-CM | POA: Diagnosis present

## 2017-04-07 DIAGNOSIS — E871 Hypo-osmolality and hyponatremia: Secondary | ICD-10-CM | POA: Diagnosis present

## 2017-04-07 DIAGNOSIS — Z87440 Personal history of urinary (tract) infections: Secondary | ICD-10-CM | POA: Diagnosis not present

## 2017-04-07 DIAGNOSIS — Z681 Body mass index (BMI) 19 or less, adult: Secondary | ICD-10-CM | POA: Diagnosis not present

## 2017-04-07 DIAGNOSIS — R9431 Abnormal electrocardiogram [ECG] [EKG]: Secondary | ICD-10-CM

## 2017-04-07 DIAGNOSIS — Z981 Arthrodesis status: Secondary | ICD-10-CM

## 2017-04-07 DIAGNOSIS — R778 Other specified abnormalities of plasma proteins: Secondary | ICD-10-CM | POA: Diagnosis present

## 2017-04-07 DIAGNOSIS — Z66 Do not resuscitate: Secondary | ICD-10-CM | POA: Diagnosis not present

## 2017-04-07 DIAGNOSIS — E860 Dehydration: Secondary | ICD-10-CM | POA: Diagnosis not present

## 2017-04-07 DIAGNOSIS — E874 Mixed disorder of acid-base balance: Secondary | ICD-10-CM | POA: Diagnosis present

## 2017-04-07 DIAGNOSIS — Z79899 Other long term (current) drug therapy: Secondary | ICD-10-CM

## 2017-04-07 DIAGNOSIS — I248 Other forms of acute ischemic heart disease: Secondary | ICD-10-CM | POA: Diagnosis not present

## 2017-04-07 DIAGNOSIS — F419 Anxiety disorder, unspecified: Secondary | ICD-10-CM | POA: Diagnosis present

## 2017-04-07 DIAGNOSIS — R748 Abnormal levels of other serum enzymes: Secondary | ICD-10-CM | POA: Diagnosis not present

## 2017-04-07 DIAGNOSIS — R569 Unspecified convulsions: Secondary | ICD-10-CM | POA: Diagnosis not present

## 2017-04-07 DIAGNOSIS — Z7189 Other specified counseling: Secondary | ICD-10-CM | POA: Diagnosis not present

## 2017-04-07 LAB — CBC
HEMATOCRIT: 36.1 % (ref 36.0–46.0)
HEMOGLOBIN: 12.7 g/dL (ref 12.0–15.0)
MCH: 32.6 pg (ref 26.0–34.0)
MCHC: 35.2 g/dL (ref 30.0–36.0)
MCV: 92.6 fL (ref 78.0–100.0)
Platelets: 242 10*3/uL (ref 150–400)
RBC: 3.9 MIL/uL (ref 3.87–5.11)
RDW: 12.6 % (ref 11.5–15.5)
WBC: 15 10*3/uL — ABNORMAL HIGH (ref 4.0–10.5)

## 2017-04-07 LAB — URINALYSIS, ROUTINE W REFLEX MICROSCOPIC
BILIRUBIN URINE: NEGATIVE
GLUCOSE, UA: NEGATIVE mg/dL
KETONES UR: NEGATIVE mg/dL
NITRITE: NEGATIVE
Protein, ur: NEGATIVE mg/dL
SPECIFIC GRAVITY, URINE: 1.005 (ref 1.005–1.030)
pH: 7 (ref 5.0–8.0)

## 2017-04-07 LAB — BASIC METABOLIC PANEL
ANION GAP: 15 (ref 5–15)
BUN: 25 mg/dL — AB (ref 6–20)
CHLORIDE: 79 mmol/L — AB (ref 101–111)
CO2: 40 mmol/L — AB (ref 22–32)
Calcium: 8.3 mg/dL — ABNORMAL LOW (ref 8.9–10.3)
Creatinine, Ser: 0.82 mg/dL (ref 0.44–1.00)
GFR calc Af Amer: >60 mL/min (ref >60)
GLUCOSE: 125 mg/dL — AB (ref 65–99)
Sodium: 134 mmol/L — ABNORMAL LOW (ref 135–145)

## 2017-04-07 LAB — CBG MONITORING, ED: Glucose-Capillary: 146 mg/dL — ABNORMAL HIGH (ref 65–99)

## 2017-04-07 LAB — I-STAT TROPONIN, ED: Troponin i, poc: 0.15 ng/mL (ref 0.00–0.08)

## 2017-04-07 LAB — TROPONIN I: Troponin I: 0.18 ng/mL (ref <0.03)

## 2017-04-07 MED ORDER — ONDANSETRON HCL 4 MG/2ML IJ SOLN: 4.0000 mg | Freq: Four times a day (QID) | INTRAMUSCULAR | Status: DC | PRN

## 2017-04-07 MED ORDER — ONDANSETRON HCL 4 MG PO TABS: 4.0000 mg | ORAL_TABLET | Freq: Four times a day (QID) | ORAL | Status: DC | PRN

## 2017-04-07 MED ORDER — ACETAMINOPHEN 325 MG PO TABS: 650.0000 mg | ORAL_TABLET | Freq: Four times a day (QID) | ORAL | Status: DC | PRN

## 2017-04-07 MED ORDER — ESCITALOPRAM OXALATE 10 MG PO TABS
10.0000 mg | ORAL_TABLET | Freq: Every day | ORAL | Status: DC
Start: 2017-04-08 — End: 2017-04-09
  Administered 2017-04-08 – 2017-04-09 (×2): 10 mg via ORAL
  Filled 2017-04-07 (×2): qty 1

## 2017-04-07 MED ORDER — MAGNESIUM SULFATE IN D5W 1-5 GM/100ML-% IV SOLN
1.0000 g | Freq: Once | INTRAVENOUS | Status: AC
  Administered 2017-04-07: 1 g via INTRAVENOUS
  Filled 2017-04-07: qty 100

## 2017-04-07 MED ORDER — ENOXAPARIN SODIUM 30 MG/0.3ML ~~LOC~~ SOLN
30.0000 mg | Freq: Every day | SUBCUTANEOUS | Status: DC
  Administered 2017-04-07 – 2017-04-08 (×2): 30 mg via SUBCUTANEOUS
  Filled 2017-04-07 (×2): qty 0.3

## 2017-04-07 MED ORDER — ROFLUMILAST 500 MCG PO TABS
500.0000 ug | ORAL_TABLET | Freq: Every day | ORAL | Status: DC
  Administered 2017-04-08 – 2017-04-09 (×2): 500 ug via ORAL
  Filled 2017-04-07 (×2): qty 1

## 2017-04-07 MED ORDER — ASPIRIN EC 81 MG PO TBEC
81.0000 mg | DELAYED_RELEASE_TABLET | Freq: Every day | ORAL | Status: DC
  Administered 2017-04-07 – 2017-04-08 (×2): 81 mg via ORAL
  Filled 2017-04-07 (×2): qty 1

## 2017-04-07 MED ORDER — MAGNESIUM SULFATE 50 % IJ SOLN: 1.0000 g | Freq: Once | INTRAMUSCULAR | Status: DC

## 2017-04-07 MED ORDER — POTASSIUM CHLORIDE 10 MEQ/100ML IV SOLN
10.0000 meq | Freq: Once | INTRAVENOUS | Status: AC
  Administered 2017-04-07: 10 meq via INTRAVENOUS
  Filled 2017-04-07: qty 100

## 2017-04-07 MED ORDER — MONTELUKAST SODIUM 10 MG PO TABS
10.0000 mg | ORAL_TABLET | Freq: Every day | ORAL | Status: DC
  Administered 2017-04-07 – 2017-04-08 (×2): 10 mg via ORAL
  Filled 2017-04-07 (×2): qty 1

## 2017-04-07 MED ORDER — SODIUM CHLORIDE 0.9 % IV BOLUS (SEPSIS)
500.0000 mL | Freq: Once | INTRAVENOUS | Status: AC
  Administered 2017-04-07: 500 mL via INTRAVENOUS

## 2017-04-07 MED ORDER — FLUTICASONE FUROATE-VILANTEROL 200-25 MCG/INH IN AEPB
1.0000 | INHALATION_SPRAY | Freq: Every day | RESPIRATORY_TRACT | Status: DC
  Administered 2017-04-08 – 2017-04-09 (×2): 1 via RESPIRATORY_TRACT
  Filled 2017-04-07: qty 28

## 2017-04-07 MED ORDER — UMECLIDINIUM BROMIDE 62.5 MCG/INH IN AEPB
1.0000 | INHALATION_SPRAY | Freq: Every day | RESPIRATORY_TRACT | Status: DC
  Administered 2017-04-08 – 2017-04-09 (×2): 1 via RESPIRATORY_TRACT
  Filled 2017-04-07: qty 7

## 2017-04-07 MED ORDER — POTASSIUM CHLORIDE CRYS ER 20 MEQ PO TBCR
40.0000 meq | EXTENDED_RELEASE_TABLET | Freq: Once | ORAL | Status: AC
Start: 2017-04-07 — End: 2017-04-07
  Administered 2017-04-07: 40 meq via ORAL
  Filled 2017-04-07: qty 2

## 2017-04-07 MED ORDER — ATORVASTATIN CALCIUM 20 MG PO TABS
20.0000 mg | ORAL_TABLET | Freq: Every day | ORAL | Status: DC
  Administered 2017-04-07 – 2017-04-08 (×2): 20 mg via ORAL
  Filled 2017-04-07 (×2): qty 1

## 2017-04-07 MED ORDER — ACETAMINOPHEN 650 MG RE SUPP: 650.0000 mg | Freq: Four times a day (QID) | RECTAL | Status: DC | PRN

## 2017-04-07 MED ORDER — POTASSIUM CHLORIDE IN NACL 40-0.9 MEQ/L-% IV SOLN
INTRAVENOUS | Status: AC
  Administered 2017-04-07: 100 mL/h via INTRAVENOUS
  Filled 2017-04-07: qty 1000

## 2017-04-07 NOTE — Telephone Encounter (Signed)
Michele Martin called to follow up on the Hospice consult and wanted to know if you would still want the patient to come to her appt tomorrow since she is so weak. Michele Martin is trying to figure out how to get here to the appt if she still needs to come. Please advise as soon as possible.

## 2017-04-07 NOTE — Telephone Encounter (Signed)
Michele Martin has called back states that patient had refused EMS but then changed her mind.  She is getting ready to be transported.  Will cancel appt scheduled w/ Dr. Jenny Reichmann for tomorrow.

## 2017-04-07 NOTE — ED Provider Notes (Signed)
[place] DEPT Provider Note   CSN: [id number] Arrival date & time: 04/07/17  1725     History   Chief Complaint Chief Complaint  Patient presents with  . Loss of Consciousness  . Fatigue    HPI Michele Martin is a 71 y.o. female.  Patient has had at least 2 syncopal episodes today.  Patient also complains of being very weak   The history is provided by the EMS personnel. No language interpreter was used.  Loss of Consciousness   This is a new problem. The current episode started less than 1 hour ago. The problem occurs rarely. The problem has been resolved. She lost consciousness for a period of less than one minute. The problem is associated with normal activity. Associated symptoms include visual change and weakness. Pertinent negatives include abdominal pain, back pain, chest pain, congestion, headaches and seizures. She has tried nothing for the symptoms. The treatment provided no relief.    Past Medical History:  Diagnosis Date  . Acute angle-closure glaucoma 02/14/2010  . ALLERGIC RHINITIS 09/19/2009  . ANXIETY 04/15/2010  . Arthritis   . ASTHMA 09/19/2009  . BACK PAIN 02/14/2010  . Chronic bronchitis 04/14/2011  . CHRONIC OBSTRUCTIVE PULMONARY DISEASE, ACUTE EXACERBATION 12/19/2009  . Complication of anesthesia   . COPD 06/27/2009  . DEPRESSION 09/19/2009  . HOARSENESS 10/17/2009  . HYPERLIPIDEMIA 09/19/2009  . MENOPAUSAL DISORDER 08/20/2010  . OSTEOPENIA 09/10/2010  . Osteoporosis, unspecified 04/20/2014  . Rotator cuff tear 06/23/2011  . TINNITUS 09/10/2010  . TREMOR 02/14/2010  . Tremor 07/30/2011  . URI 09/10/2010  . Wheezing 06/27/2009    Patient Active Problem List   Diagnosis Date Noted  . Acute metabolic encephalopathy 47/82/9562  . Hypochloremic alkalosis 03/20/2017  . Hyponatremia 03/20/2017  . Tachycardia 03/15/2017  . Rib pain on left side 03/13/2017  . Rib pain on right side 03/13/2017  . Elevated troponin 02/23/2017  . Malnutrition of  moderate degree 02/23/2017  . Acute on chronic respiratory failure with hypoxia and hypercapnia ([place]) 02/22/2017  . Hypokalemia 02/22/2017  . Chronic diastolic CHF (congestive heart failure) ([place]) 01/13/2017  . Chronic respiratory failure with hypoxia and hypercapnia ([place]) 01/13/2017  . SIRS (systemic inflammatory response syndrome) ([place]) 12/05/2016  . Acute lower UTI 12/05/2016  . Acute respiratory failure with hypoxia ([place]) 12/05/2016  . COPD with acute exacerbation ([place]) 12/05/2016  . Abnormal TSH 10/28/2016  . B12 deficiency 09/18/2016  . Abnormal finding on screening procedure 09/18/2016  . Risk for falls 08/27/2016  . Protein-calorie malnutrition, severe 08/06/2016  . COPD, group D, by GOLD 2017 classification ([place]) 08/05/2016  . Acute respiratory failure with hypoxia and hypercapnia ([place]) 07/26/2016  . Grief reaction 05/15/2016  . Rotator cuff tear arthropathy of both shoulders 04/17/2016  . Neck pain on right side 03/13/2016  . Angioedema 10/23/2015  . Cough 09/07/2015  . Benign neoplasm of meninges ([place]) 08/29/2015  . Dizziness and giddiness 08/29/2015  . Mild cognitive impairment 08/10/2015  . Benign paroxysmal positional vertigo 08/10/2015  . Hyperlipidemia 08/10/2015  . Memory dysfunction 07/03/2015  . Solitary pulmonary nodule 05/09/2015  . Right hip pain 01/23/2015  . Chest pain 10/25/2014  . Right sided sciatica 10/11/2014  . Chronic pain syndrome 10/11/2014  . Osteoporosis 04/20/2014  . COPD exacerbation ([place]) 08/25/2013  . General weakness 12/22/2012  . Personal history of colonic polyps 10/25/2012  . Anterior neck pain 10/25/2012  . COPD GOLD III/ still smoking  08/24/2012  . Shingles 07/14/2012  . Orthostatic hypotension 05/07/2012  .  Involuntary movements 11/19/2011  . Tremor 07/30/2011  . Rotator cuff tear 06/23/2011  . Lower back pain 06/23/2011  . Balance problem 06/23/2011  . Tobacco abuse 05/22/2011  . Preop exam for internal medicine 05/13/2011    . Chronic bronchitis ([place]) 04/14/2011  . Peripheral edema 01/13/2011  . Right shoulder pain 01/13/2011  . Fatigue 12/03/2010  . Encounter for well adult exam with abnormal findings 10/31/2010  . TINNITUS 09/10/2010  . MENOPAUSAL DISORDER 08/20/2010  . Anxiety and depression 04/15/2010  . Acute angle-closure glaucoma 02/14/2010  . HOARSENESS 10/17/2009  . Allergic rhinitis 09/19/2009    Past Surgical History:  Procedure Laterality Date  . ABDOMINAL HYSTERECTOMY    . cervical fusion 2013 - Dr Vertell Limber    . Normal Heart Cath  2001   Dr. Johnsie Cancel  . s/p laser tx for glaucoma     no vision loss    OB History    No data available       Home Medications    Prior to Admission medications   Medication Sig Start Date End Date Taking? Authorizing Provider  albuterol (PROVENTIL HFA) 108 (90 Base) MCG/ACT inhaler Inhale 2 puffs into the lungs every 6 (six) hours as needed for wheezing or shortness of breath. 03/26/17  Yes Regalado, Belkys A, MD  albuterol (PROVENTIL) (2.5 MG/3ML) 0.083% nebulizer solution INHALE 1 VIAL VIA NEBULIZER EVERY 6 HOURS AS NEEDED FOR WHEEZING OR FOR SHORTNESS OF BREATH 04/01/17  Yes Biagio Borg, MD  aspirin EC 81 MG tablet Take 1 tablet (81 mg total) by mouth at bedtime. 03/18/17  Yes Biagio Borg, MD  atorvastatin (LIPITOR) 20 MG tablet Take 1 tablet (20 mg total) by mouth at bedtime. 02/06/17  Yes Biagio Borg, MD  b complex vitamins tablet Take 1 tablet by mouth at bedtime. 03/18/17  Yes Biagio Borg, MD  clonazePAM (KLONOPIN) 0.5 MG tablet TAKE ONE (1) TABLET BY MOUTH TWICE DAILY AS NEEDED FOR ANXIETY 03/26/17  Yes Biagio Borg, MD  donepezil (ARICEPT) 10 MG tablet Take 1 tablet (10 mg total) by mouth at bedtime. 03/18/17  Yes Biagio Borg, MD  escitalopram (LEXAPRO) 10 MG tablet Take 10 mg by mouth daily. 03/31/17  Yes [provider]  feeding supplement, ENSURE ENLIVE, (ENSURE ENLIVE) LIQD Take 237 mLs by mouth 2 (two) times daily between meals. 03/26/17   Yes Regalado, Belkys A, MD  fluticasone furoate-vilanterol (BREO ELLIPTA) 200-25 MCG/INH AEPB Inhale 1 puff into the lungs daily. 04/06/17  Yes Mannam, Praveen, MD  furosemide (LASIX) 40 MG tablet Take 1 tablet (40 mg total) by mouth daily. 01/13/17  Yes Biagio Borg, MD  gabapentin (NEURONTIN) 100 MG capsule TAKE 1 CAPSULE BY MOUTH THREE TIMES DAILY 01/16/17  Yes Biagio Borg, MD  guaiFENesin (MUCINEX) 600 MG 12 hr tablet Take 1 tablet (600 mg total) by mouth 2 (two) times daily. 03/18/17  Yes Biagio Borg, MD  hydrochlorothiazide (HYDRODIURIL) 25 MG tablet Take 25 mg by mouth daily. 03/31/17  Yes [provider]  montelukast (SINGULAIR) 10 MG tablet TAKE 1 TABLET BY MOUTH EVERY NIGHT AT BEDTIME 04/01/17  Yes Biagio Borg, MD  potassium chloride (K-DUR) 10 MEQ tablet Take 1 tablet (10 mEq total) by mouth daily. 03/26/17  Yes Regalado, Belkys A, MD  Probiotic CAPS Take 1 capsule by mouth every day 03/18/17  Yes Biagio Borg, MD  roflumilast (DALIRESP) 500 MCG TABS tablet Take 1 tablet (500 mcg total) by mouth daily.  03/18/17  Yes Biagio Borg, MD  umeclidinium bromide (INCRUSE ELLIPTA) 62.5 MCG/INH AEPB Inhale 1 puff into the lungs daily. 03/03/17  Yes Mannam, Praveen, MD  escitalopram (LEXAPRO) 20 MG tablet Take 0.5 tablets (10 mg total) by mouth daily. Patient not taking: Reported on 04/07/2017 08/20/16   Biagio Borg, MD  fluticasone furoate-vilanterol (BREO ELLIPTA) 200-25 MCG/INH AEPB Inhale 1 puff into the lungs daily. Patient not taking: Reported on 04/07/2017 03/03/17   Marshell Garfinkel, MD  traMADol (ULTRAM) 50 MG tablet Take 1 tablet (50 mg total) by mouth every 8 (eight) hours as needed. Patient not taking: Reported on 04/06/2017 03/13/17   Biagio Borg, MD    Family History Family History  Problem Relation Age of Onset  . Dementia Mother   . Rheum arthritis Maternal Uncle   . Colon cancer Neg Hx     Social History Social History  Substance Use Topics  . Smoking status: Former  Smoker    Packs/day: 0.50    Years: 50.00    Types: Cigarettes    Start date: 07/03/2015  . Smokeless tobacco: Never Used     Comment: stopped smoking for 1 month  . Alcohol use No     Allergies   Pravastatin; Simvastatin; and Statins   Review of Systems Review of Systems  Constitutional: Negative for appetite change and fatigue.  HENT: Negative for congestion, ear discharge and sinus pressure.   Eyes: Negative for discharge.  Respiratory: Negative for cough.   Cardiovascular: Positive for syncope. Negative for chest pain.  Gastrointestinal: Negative for abdominal pain and diarrhea.  Genitourinary: Negative for frequency and hematuria.  Musculoskeletal: Negative for back pain.  Skin: Negative for rash.  Neurological: Positive for weakness. Negative for seizures and headaches.  Psychiatric/Behavioral: Negative for hallucinations.     Physical Exam Updated Vital Signs BP 96/75 (BP Location: Left Arm)   Pulse 89   Temp 97.6 F (36.4 C) (Oral)   Resp 10   Ht 5\' 2"  (1.575 m)   Wt 39.5 kg (87 lb)   SpO2 100%   BMI 15.91 kg/m   Physical Exam  Constitutional: She is oriented to person, place, and time. She appears well-developed.  HENT:  Head: Normocephalic.  Eyes: Conjunctivae and EOM are normal. No scleral icterus.  Neck: Neck supple. No thyromegaly present.  Cardiovascular: Normal rate and regular rhythm.  Exam reveals no gallop and no friction rub.   No murmur heard. Pulmonary/Chest: No stridor. She has no wheezes. She has no rales. She exhibits no tenderness.  Abdominal: She exhibits no distension. There is no tenderness. There is no rebound.  Musculoskeletal: Normal range of motion. She exhibits no edema.  Lymphadenopathy:    She has no cervical adenopathy.  Neurological: She is oriented to person, place, and time. She exhibits normal muscle tone. Coordination normal.  Skin: No rash noted. No erythema.  Psychiatric: She has a normal mood and affect. Her  behavior is normal.     ED Treatments / Results  Labs (all labs ordered are listed, but only abnormal results are displayed) Labs Reviewed  BASIC METABOLIC PANEL - Abnormal; Notable for the following:       Result Value   Sodium 134 (*)    Potassium <2.0 (*)    Chloride 79 (*)    CO2 40 (*)    Glucose, Bld 125 (*)    BUN 25 (*)    Calcium 8.3 (*)    All other components within normal limits  CBC - Abnormal; Notable for the following:    WBC 15.0 (*)    All other components within normal limits  URINALYSIS, ROUTINE W REFLEX MICROSCOPIC - Abnormal; Notable for the following:    Color, Urine STRAW (*)    Hgb urine dipstick SMALL (*)    Leukocytes, UA MODERATE (*)    Bacteria, UA RARE (*)    Squamous Epithelial / LPF 0-5 (*)    All other components within normal limits  CBG MONITORING, ED - Abnormal; Notable for the following:    Glucose-Capillary 146 (*)    All other components within normal limits  I-STAT TROPONIN, ED - Abnormal; Notable for the following:    Troponin i, poc 0.15 (*)    All other components within normal limits    EKG  EKG Interpretation  Date/Time:  Tuesday April 07 2017 17:57:04 EDT Ventricular Rate:  88 PR Interval:    QRS Duration: 93 QT Interval:  463 QTC Calculation: 561 R Axis:   61 Text Interpretation:  Sinus rhythm Biatrial enlargement Probable left ventricular hypertrophy Repol abnrm, severe global ischemia (LM/MVD) Prolonged QT interval Confirmed by Milton Ferguson [phone]) on 04/07/2017 6:52:45 PM       Radiology Ct Head Wo Contrast  Result Date: 04/07/2017 CLINICAL DATA:  Multiple syncopal episodes EXAM: CT HEAD WITHOUT CONTRAST TECHNIQUE: Contiguous axial images were obtained from the base of the skull through the vertex without intravenous contrast. COMPARISON:  CT 03/20/2017, MRI 05/11/2014 FINDINGS: Brain: Mild atrophy. Mild chronic microvascular ischemic change in the white matter. Negative for acute infarct or hemorrhage.  Densely calcified meningioma along the anterior falx on the right is unchanged measuring 18 x 11 mm Vascular: Negative for hyperdense vessel Skull: Negative Sinuses/Orbits: Negative Other: None IMPRESSION: Atrophy and chronic microvascular ischemia. Stable calcified meningioma anteriorly No acute abnormality. Electronically Signed   By: Franchot Gallo M.D.   On: 04/07/2017 19:32   Dg Chest Port 1 View  Result Date: 04/07/2017 CLINICAL DATA:  Pt comes from home via [facility]. Pt has had multiple syncopal episodes and denied EMS several times. EXAM: PORTABLE CHEST 1 VIEW COMPARISON:  03/22/2017 FINDINGS: The heart size and mediastinal contours are within normal limits. Both lungs are clear. The visualized skeletal structures are unremarkable. IMPRESSION: No active disease. Electronically Signed   By: Nolon Nations M.D.   On: 04/07/2017 19:09    Procedures Procedures (including critical care time)  Medications Ordered in ED Medications  potassium chloride 10 mEq in 100 mL IVPB (not administered)  potassium chloride 10 mEq in 100 mL IVPB (10 mEq Intravenous New Bag/Given 04/07/17 1954)  magnesium sulfate IVPB 1 g 100 mL (not administered)  sodium chloride 0.9 % bolus 500 mL (500 mLs Intravenous New Bag/Given 04/07/17 1834)  potassium chloride SA (K-DUR,KLOR-CON) CR tablet 40 mEq (40 mEq Oral Given 04/07/17 1954)     Initial Impression / Assessment and Plan / ED Course  I have reviewed the triage vital signs and the nursing notes.  Pertinent labs & imaging results that were available during my care of the patient were reviewed by me and considered in my medical decision making (see chart for details). CRITICAL CARE Performed by: Lorraine Cimmino L Total critical care time: 35 minutes Critical care time was exclusive of separately billable procedures and treating other patients. Critical care was necessary to treat or prevent imminent or life-threatening deterioration. Critical care was time  spent personally by me on the following activities: development of treatment plan with patient and/or  surrogate as well as nursing, discussions with consultants, evaluation of patient's response to treatment, examination of patient, obtaining history from patient or surrogate, ordering and performing treatments and interventions, ordering and review of laboratory studies, ordering and review of radiographic studies, pulse oximetry and re-evaluation of patient's condition.   patient will be admitted for hypokalemia and elevated troponin. I spoke with cardiology and they recommended repeating the troponin if the troponin is significantly higher than it is now they would recommend starting heparin. Also cardiology will follow patient in the a.m.   Final Clinical Impressions(s) / ED Diagnoses   Final diagnoses:  None    New Prescriptions New Prescriptions   No medications on file     Milton Ferguson, MD 04/07/17 2103

## 2017-04-07 NOTE — ED Notes (Signed)
Attempted to get blood, but was unsuccessful.

## 2017-04-07 NOTE — ED Notes (Signed)
Granddaughter Mandy at [employer] bedside. Her number is [phone]

## 2017-04-07 NOTE — Patient Outreach (Signed)
Care Coordination:  Placed follow up call to MD office. Return call from MD office and spoke with Sheron who will get a message to MD and call me back.  PLAN: Will await a call back.  Tomasa Rand, RN, BSN, CEN [facility] [employer] [phone]

## 2017-04-07 NOTE — Telephone Encounter (Signed)
Left message advising of dr Judi Cong note

## 2017-04-07 NOTE — Addendum Note (Signed)
Addended by: Biagio Borg on: 04/07/2017 05:47 PM   Modules accepted: Orders

## 2017-04-07 NOTE — ED Triage Notes (Signed)
Pt comes from home via [facility]. Pt has had multiple syncopal episodes and denied EMS several times. Pt has not eaten or drank any liquids. Pt wants to talk to MD about DNR, and possible hospice care. Pt is Alert and Oriented however very weak.

## 2017-04-07 NOTE — Telephone Encounter (Signed)
Noted  Please let me know if I need to place the order for hospice

## 2017-04-07 NOTE — Telephone Encounter (Signed)
FYI

## 2017-04-07 NOTE — Progress Notes (Signed)
Lovenox per Pharmacy for DVT Prophylaxis    Pharmacy has been consulted from dosing enoxaparin (lovenox) in this patient for DVT prophylaxis.  The pharmacist has reviewed pertinent labs (Hgb _12.7__; PLT__242_), patient weight (_39.5__kg) and renal function (CrCl_38__mL/min) and decided that enoxaparin _30_mg SQ Q24Hrs is appropriate for this patient.  The pharmacy department will sign off at this time.  Please reconsult pharmacy if status changes or for further issues.  Thank you  Cyndia Diver PharmD, BCPS  04/07/2017, 10:25 PM

## 2017-04-07 NOTE — Telephone Encounter (Signed)
All I can recommend is for Home Health to continue to see, maybe more often, or go to ED for severe weakness and high risk fall, or request private transport (even a private ambulance) to bring her, thanks

## 2017-04-07 NOTE — Telephone Encounter (Signed)
Michele Martin from [facility] has been notified.

## 2017-04-07 NOTE — Telephone Encounter (Signed)
Yes please

## 2017-04-07 NOTE — Telephone Encounter (Signed)
Tomasa Rand w/ [facility] calling back in regard  Please follow back up [phone].

## 2017-04-07 NOTE — ED Notes (Signed)
Admitting MD at bedside, wants to hold off on drawing istat troponin at this time due to adding additional blood draws to orders.

## 2017-04-07 NOTE — Telephone Encounter (Signed)
Ok, this is done 

## 2017-04-07 NOTE — Telephone Encounter (Signed)
Yes, this would normally need ROV

## 2017-04-07 NOTE — Telephone Encounter (Signed)
Please advise with JJ

## 2017-04-07 NOTE — Patient Outreach (Signed)
Care coordination/ home visit:  Placed call to MD office to follow up on message left yesterday. 1:30 received a call back with approval from MD that they will placed hospice/pallative care order. Reviewed that MD did want to see patient for office visit tomorrow.  2pm. Placed call to [place] to arrange transportation to office visit. 2:30pm spoke with Bary Castilla with approval for [facility] to provide transportation.  3pm:  Placed call to patient and spoke with sister [name] who reports that patient passed out and EMS is at the home.  Reports patients BP is 60/30.  Sister asked me to come to the home. 3:15pm  Arrived at home. Patient refusing transport to the hospital. Ems left. 3:45pm  Spoke with patient about how she was feeling and she states weak. Reports that she feels like she gets weaker every time she is in the hospital and that is why she did not want to go. [place] daughter and sister with patient who then decided to go to the hospital for treatment.  [place] daughter called EMS and EMS arrived to transport patient to the Emergency department.   430: Placed call to MD office to review events and send MD a message. Also appointment for office visit for tomorrow cancelled.  PLAN: will notify [facility] hospital liaisons of patients transport back to the ED.  Maybe if admitted patient could have her evaluation from hospice and pallative services. Patient will need a plan of care if discharged back to home as she is not safe to live alone due to falls.  Tomasa Rand, RN, BSN, CEN [facility] [employer] [phone]

## 2017-04-07 NOTE — ED Notes (Signed)
Please call report to [place] at [phone]

## 2017-04-08 ENCOUNTER — Inpatient Hospital Stay: Payer: Medicare Other | Admitting: Internal Medicine

## 2017-04-08 ENCOUNTER — Telehealth: Payer: Self-pay | Admitting: Internal Medicine

## 2017-04-08 DIAGNOSIS — L8990 Pressure ulcer of unspecified site, unspecified stage: Secondary | ICD-10-CM | POA: Insufficient documentation

## 2017-04-08 DIAGNOSIS — I5032 Chronic diastolic (congestive) heart failure: Secondary | ICD-10-CM

## 2017-04-08 DIAGNOSIS — E876 Hypokalemia: Principal | ICD-10-CM

## 2017-04-08 DIAGNOSIS — R9431 Abnormal electrocardiogram [ECG] [EKG]: Secondary | ICD-10-CM

## 2017-04-08 DIAGNOSIS — R748 Abnormal levels of other serum enzymes: Secondary | ICD-10-CM

## 2017-04-08 DIAGNOSIS — E871 Hypo-osmolality and hyponatremia: Secondary | ICD-10-CM

## 2017-04-08 LAB — BLOOD GAS, VENOUS
Acid-Base Excess: 13.4 mmol/L — ABNORMAL HIGH (ref 0.0–2.0)
Bicarbonate: 40.1 mmol/L — ABNORMAL HIGH (ref 20.0–28.0)
O2 CONTENT: 2 L/min
O2 SAT: 85 %
PATIENT TEMPERATURE: 98.6
PO2 VEN: 54.4 mmHg — AB (ref 32.0–45.0)
pCO2, Ven: 60.7 mmHg — ABNORMAL HIGH (ref 44.0–60.0)
pH, Ven: 7.435 — ABNORMAL HIGH (ref 7.250–7.430)

## 2017-04-08 LAB — BASIC METABOLIC PANEL
ANION GAP: 12 (ref 5–15)
ANION GAP: 7 (ref 5–15)
BUN: 23 mg/dL — ABNORMAL HIGH (ref 6–20)
BUN: 21 mg/dL — ABNORMAL HIGH (ref 6–20)
CALCIUM: 8.5 mg/dL — AB (ref 8.9–10.3)
CALCIUM: 9 mg/dL (ref 8.9–10.3)
CO2: 38 mmol/L — ABNORMAL HIGH (ref 22–32)
CO2: 37 mmol/L — ABNORMAL HIGH (ref 22–32)
Chloride: 81 mmol/L — ABNORMAL LOW (ref 101–111)
Chloride: 91 mmol/L — ABNORMAL LOW (ref 101–111)
Creatinine, Ser: 0.74 mg/dL (ref 0.44–1.00)
Creatinine, Ser: 0.59 mg/dL (ref 0.44–1.00)
GLUCOSE: 103 mg/dL — AB (ref 65–99)
Glucose, Bld: 106 mg/dL — ABNORMAL HIGH (ref 65–99)
POTASSIUM: 2.4 mmol/L — AB (ref 3.5–5.1)
POTASSIUM: 4.3 mmol/L (ref 3.5–5.1)
SODIUM: 131 mmol/L — AB (ref 135–145)
SODIUM: 135 mmol/L (ref 135–145)

## 2017-04-08 LAB — CBC
HCT: 34.4 % — ABNORMAL LOW (ref 36.0–46.0)
Hemoglobin: 12 g/dL (ref 12.0–15.0)
MCH: 32.5 pg (ref 26.0–34.0)
MCHC: 34.9 g/dL (ref 30.0–36.0)
MCV: 93.2 fL (ref 78.0–100.0)
Platelets: 253 10*3/uL (ref 150–400)
RBC: 3.69 MIL/uL — AB (ref 3.87–5.11)
RDW: 12.9 % (ref 11.5–15.5)
WBC: 12.4 10*3/uL — ABNORMAL HIGH (ref 4.0–10.5)

## 2017-04-08 LAB — TROPONIN I: TROPONIN I: 0.15 ng/mL — AB (ref <0.03)

## 2017-04-08 LAB — MAGNESIUM: Magnesium: 1.8 mg/dL (ref 1.7–2.4)

## 2017-04-08 MED ORDER — ALBUTEROL SULFATE (2.5 MG/3ML) 0.083% IN NEBU
2.5000 mg | INHALATION_SOLUTION | Freq: Three times a day (TID) | RESPIRATORY_TRACT | Status: DC
  Administered 2017-04-09 (×2): 2.5 mg via RESPIRATORY_TRACT
  Filled 2017-04-08 (×2): qty 3

## 2017-04-08 MED ORDER — CLONAZEPAM 0.5 MG PO TABS
0.5000 mg | ORAL_TABLET | Freq: Two times a day (BID) | ORAL | Status: DC | PRN
  Administered 2017-04-09: 0.5 mg via ORAL
  Filled 2017-04-08: qty 1

## 2017-04-08 MED ORDER — ORAL CARE MOUTH RINSE: 15.0000 mL | Freq: Two times a day (BID) | OROMUCOSAL | Status: DC

## 2017-04-08 MED ORDER — ADULT MULTIVITAMIN W/MINERALS CH
1.0000 | ORAL_TABLET | Freq: Every day | ORAL | Status: DC
  Administered 2017-04-08 – 2017-04-09 (×2): 1 via ORAL
  Filled 2017-04-08 (×2): qty 1

## 2017-04-08 MED ORDER — POTASSIUM CHLORIDE CRYS ER 20 MEQ PO TBCR
40.0000 meq | EXTENDED_RELEASE_TABLET | ORAL | Status: AC
  Administered 2017-04-08 (×2): 40 meq via ORAL
  Filled 2017-04-08 (×2): qty 2

## 2017-04-08 MED ORDER — POTASSIUM CHLORIDE CRYS ER 20 MEQ PO TBCR
40.0000 meq | EXTENDED_RELEASE_TABLET | Freq: Once | ORAL | Status: AC
  Administered 2017-04-08: 40 meq via ORAL
  Filled 2017-04-08: qty 2

## 2017-04-08 MED ORDER — POTASSIUM CHLORIDE IN NACL 40-0.9 MEQ/L-% IV SOLN
INTRAVENOUS | Status: DC
  Administered 2017-04-08: 100 mL/h via INTRAVENOUS
  Filled 2017-04-08: qty 1000

## 2017-04-08 MED ORDER — GABAPENTIN 100 MG PO CAPS
100.0000 mg | ORAL_CAPSULE | Freq: Three times a day (TID) | ORAL | Status: DC
  Administered 2017-04-08 – 2017-04-09 (×3): 100 mg via ORAL
  Filled 2017-04-08 (×3): qty 1

## 2017-04-08 MED ORDER — ENSURE ENLIVE PO LIQD
237.0000 mL | Freq: Two times a day (BID) | ORAL | Status: DC
  Administered 2017-04-08 – 2017-04-09 (×2): 237 mL via ORAL

## 2017-04-08 MED ORDER — SODIUM CHLORIDE 0.9 % IV SOLN
INTRAVENOUS | Status: DC
  Administered 2017-04-08 – 2017-04-09 (×2): via INTRAVENOUS

## 2017-04-08 MED ORDER — CHLORHEXIDINE GLUCONATE 0.12 % MT SOLN
15.0000 mL | Freq: Two times a day (BID) | OROMUCOSAL | Status: DC
  Administered 2017-04-08 – 2017-04-09 (×3): 15 mL via OROMUCOSAL
  Filled 2017-04-08 (×3): qty 15

## 2017-04-08 MED ORDER — NYSTATIN 100000 UNIT/ML MT SUSP
5.0000 mL | Freq: Four times a day (QID) | OROMUCOSAL | Status: DC
  Administered 2017-04-08 – 2017-04-09 (×4): 500000 [IU] via ORAL
  Filled 2017-04-08 (×4): qty 5

## 2017-04-08 MED ORDER — ALBUTEROL SULFATE (2.5 MG/3ML) 0.083% IN NEBU
2.5000 mg | INHALATION_SOLUTION | Freq: Four times a day (QID) | RESPIRATORY_TRACT | Status: DC
  Administered 2017-04-08: 2.5 mg via RESPIRATORY_TRACT
  Filled 2017-04-08: qty 3

## 2017-04-08 MED ORDER — ALBUTEROL SULFATE (2.5 MG/3ML) 0.083% IN NEBU: 2.5000 mg | INHALATION_SOLUTION | Freq: Four times a day (QID) | RESPIRATORY_TRACT | Status: DC

## 2017-04-08 MED ORDER — POTASSIUM CHLORIDE 10 MEQ/100ML IV SOLN
10.0000 meq | INTRAVENOUS | Status: AC
  Administered 2017-04-08: 10 meq via INTRAVENOUS
  Filled 2017-04-08 (×4): qty 100

## 2017-04-08 NOTE — Consult Note (Addendum)
   [facility] CM Inpatient Consult   04/08/2017  Michele Martin 04-25-1946 [id number]    Made aware of hospitalization by [facility] RNCM.  [name] is active with [facility] Care Management. She is followed by [facility] RNCM and [facility] Pharmacist. Please see chart review tab then encounter for further patient outreach details by [place] indicates referral was made to [facility] for assessment prior to hospitalization. [facility] RNCM reports hospice and palliative services were not in place prior to hospital admit. [name] can benefit from a goals of care meeting while in the hospital to further assess goals for patient's wishes.  Spoke with inpatient RNCM to make aware of the above. Will continue to follow.   Appreciative of collaboration efforts of [place] and inpatient RNCM.   Marthenia Rolling, MSN-Ed, RN,BSN [facility] Liaison [phone]

## 2017-04-08 NOTE — Care Management Note (Signed)
Case Management Note  Patient Details  Name: KAYLAANN MOUNTZ MRN: [id number] Date of Birth: [birth year]  Subjective/Objective: 71 y/o f admitted w/Hypokalemia, hypotension. Readmit-COPD. From home alone. [facility] active-they are recc [facility]-home hospice was not set up by pcp office. PT cons-await recc.                   Action/Plan:d/c plan home.   Expected Discharge Date:   (unknown)               Expected Discharge Plan:  [place]  In-House Referral:     Discharge planning Services  CM Consult  Post Acute Care Choice:  Resumption of Svcs/PTA Provider [facility] Active) Choice offered to:     DME Arranged:    DME Agency:     HH Arranged:    [place] Agency:     Status of Service:  In process, will continue to follow  If discussed at Long Length of Stay Meetings, dates discussed:    Additional Comments:  Dessa Phi, RN 04/08/2017, 1:25 PM

## 2017-04-08 NOTE — Progress Notes (Signed)
Initial Nutrition Assessment  DOCUMENTATION CODES:   Severe malnutrition in context of acute illness/injury, Underweight  INTERVENTION:  - Will order Ensure Enlive po BID, each supplement provides 350 kcal and 20 grams of protein - Will order daily multivitamin with minerals.  - Encourage PO intakes of meals and supplements.  - Will continue to monitor for additional nutrition-related needs.   NUTRITION DIAGNOSIS:   Malnutrition (severe) related to acute illness (falls and weakness with hypokalemia) as evidenced by percent weight loss, moderate depletions of muscle mass, moderate depletion of body fat.  GOAL:   Patient will meet greater than or equal to 90% of their needs  MONITOR:   PO intake, Supplement acceptance, Weight trends, Labs, Skin  REASON FOR ASSESSMENT:   Malnutrition Screening Tool  ASSESSMENT:   71 y.o. female with a past medical history significant for mild cognitive impairment, COPD/asthma, and HFPEF who presents with falls.  Pt seen for MST. BMI indicates underweight status. Per chart review, pt consumed 50% of breakfast this AM. Pt reports variable intakes PTA; some days she eats well and most days she mainly nibbles/snacks throughout the day. She does not have any difficulties with chewing or swallowing and no abdominal pain or nausea associated with PO intakes. She has been drinking Ensure at home for the past few weeks to past few months and mainly only drinks one bottle/day as it makes her feel very full. Will order BID here and encourage pt to use it to take medications rather than taking medications with water or juice. Pt with hx of COPD and on admission in July for exacerbation she had reported that SOB does not affect PO intakes or ability to eat; will monitor if this remains true or has changed since that time.  Physical assessment shows moderate muscle wasting to shoulder, clavicle, and legs and moderate fat wasting to upper arm areas; mild edema to BLE  noted. Per chart review, pt has lost 6 lbs (6% body weight) in the past 1 month which is significant for time frame. Noted many weight fluctuations over the past 4 months.   Medications reviewed; 1 g IV Mg sulfate x1 run yesterday, 10 mEq IV KCl x4 runs today, 10 mEq IV KCl x2 runs yesterday, 40 mEq oral KCl x1 dose yesterday and x1 dose today. Labs reviewed; Na: 131 mmol/L, K: 2.4 mmol/L, Cl: 81 mmol/L, BUN: 23 mg/dL, Ca: 8.5 mg/dL.    Diet Order:  Diet regular Room service appropriate? Yes; Fluid consistency: Thin  Skin:  Wound (see comment) (Stage 2 sacral pressure injury)  Last BM:  PTA/unknown  Height:   Ht Readings from Last 1 Encounters:  04/07/17 5\' 1"  (1.549 m)    Weight:   Wt Readings from Last 1 Encounters:  04/07/17 94 lb 9.2 oz (42.9 kg)    Ideal Body Weight:  47.73 kg  BMI:  Body mass index is 17.87 kg/m.  Estimated Nutritional Needs:   Kcal:  1505-1715 (35-40 kcal/kg)  Protein:  65-73 grams (1.5-1.7 garms/kg)  Fluid:  >/= 1.5L/day  EDUCATION NEEDS:   No education needs identified at this time    Jarome Matin, MS, RD, LDN, CNSC Inpatient Clinical Dietitian Pager # [phone] After hours/weekend pager # [phone]

## 2017-04-08 NOTE — Telephone Encounter (Signed)
Crystal from [facility] called stating that they received the pt assistance form today but it was not complete. They need the prescriber information filled out and drug info as well.

## 2017-04-08 NOTE — H&P (Signed)
History and Physical  Patient Name: Michele Martin     CXK:[id number]    DOB: 1946-01-18    DOA: 04/07/2017 PCP: Biagio Borg, MD  Patient coming from: Home  Chief Complaint: Fall      HPI: Michele Martin is a 71 y.o. female with a past medical history significant for mild cognitive impairment, COPD/asthma, and HFPEF who presents with falls.  The patient is a vague and unreliable historian.  She was admitted to the hospital 2 weeks ago for a COPD flare, since then has been back home.  She confirms to me that she has fallen, and has been extremely weak, and that this has been ongoing for days, but otherwise she can provide no temporal pattern or other positive findings.  Today, she fell numerous times, presumably called EMS herself to get help back up but refused transport until the last time.  ED course: -Afebrile, heart rate 87, respirations 14, pulse ox 99% on O2 by nasal cannula and blood pressure 116/77 -Na 134, K <2, Cr 0.8, WBC 15K, Hgb 12.7 -UA with hyaline casts, no RBC, WBCs -Troponin 0.15 -CXR clear -CT head no acute change -ECG showed prolonged QT interval -Bicarb significantly elevated -She was given Mag, K and the case was discussed with Cardiology who doubted ACS and recommended medical admission with Cardiology consultation tomorrow      ROS: Review of Systems  Constitutional: Negative for fever.  Respiratory: Negative for cough, sputum production, shortness of breath and wheezing.   Cardiovascular: Negative for chest pain, palpitations and leg swelling.  Musculoskeletal: Positive for falls.  Neurological: Positive for weakness. Negative for dizziness and focal weakness.  All other systems reviewed and are negative.         Past Medical History:  Diagnosis Date  . Acute angle-closure glaucoma 02/14/2010  . ALLERGIC RHINITIS 09/19/2009  . ANXIETY 04/15/2010  . Arthritis   . ASTHMA 09/19/2009  . BACK PAIN 02/14/2010  . Chronic bronchitis 04/14/2011   . CHRONIC OBSTRUCTIVE PULMONARY DISEASE, ACUTE EXACERBATION 12/19/2009  . Complication of anesthesia   . COPD 06/27/2009  . DEPRESSION 09/19/2009  . HOARSENESS 10/17/2009  . HYPERLIPIDEMIA 09/19/2009  . MENOPAUSAL DISORDER 08/20/2010  . OSTEOPENIA 09/10/2010  . Osteoporosis, unspecified 04/20/2014  . Rotator cuff tear 06/23/2011  . TINNITUS 09/10/2010  . TREMOR 02/14/2010  . Tremor 07/30/2011  . URI 09/10/2010  . Wheezing 06/27/2009    Past Surgical History:  Procedure Laterality Date  . ABDOMINAL HYSTERECTOMY    . cervical fusion 2013 - Dr Vertell Limber    . Normal Heart Cath  2001   Dr. Johnsie Cancel  . s/p laser tx for glaucoma     no vision loss    Social History: Patient lives alone and is independent with ADLs.    Allergies  Allergen Reactions  . Pravastatin Itching and Other (See Comments)    Reaction:  Muscle cramps   . Simvastatin Itching and Other (See Comments)    Reaction:  Muscle cramps   . Statins Itching and Other (See Comments)    Reaction:  Muscle cramps    Family history: family history includes Dementia in her mother; Rheum arthritis in her maternal uncle.  Prior to Admission medications   Medication Sig Start Date End Date Taking? Authorizing Provider  albuterol (PROVENTIL HFA) 108 (90 Base) MCG/ACT inhaler Inhale 2 puffs into the lungs every 6 (six) hours as needed for wheezing or shortness of breath. 03/26/17  Yes Regalado, Cassie Freer, MD  albuterol (  PROVENTIL) (2.5 MG/3ML) 0.083% nebulizer solution INHALE 1 VIAL VIA NEBULIZER EVERY 6 HOURS AS NEEDED FOR WHEEZING OR FOR SHORTNESS OF BREATH 04/01/17  Yes Biagio Borg, MD  aspirin EC 81 MG tablet Take 1 tablet (81 mg total) by mouth at bedtime. 03/18/17  Yes Biagio Borg, MD  atorvastatin (LIPITOR) 20 MG tablet Take 1 tablet (20 mg total) by mouth at bedtime. 02/06/17  Yes Biagio Borg, MD  b complex vitamins tablet Take 1 tablet by mouth at bedtime. 03/18/17  Yes Biagio Borg, MD  clonazePAM (KLONOPIN) 0.5 MG tablet TAKE ONE (1)  TABLET BY MOUTH TWICE DAILY AS NEEDED FOR ANXIETY 03/26/17  Yes Biagio Borg, MD  donepezil (ARICEPT) 10 MG tablet Take 1 tablet (10 mg total) by mouth at bedtime. 03/18/17  Yes Biagio Borg, MD  escitalopram (LEXAPRO) 10 MG tablet Take 10 mg by mouth daily. 03/31/17  Yes [provider]  feeding supplement, ENSURE ENLIVE, (ENSURE ENLIVE) LIQD Take 237 mLs by mouth 2 (two) times daily between meals. 03/26/17  Yes Regalado, Belkys A, MD  fluticasone furoate-vilanterol (BREO ELLIPTA) 200-25 MCG/INH AEPB Inhale 1 puff into the lungs daily. 04/06/17  Yes Mannam, Praveen, MD  furosemide (LASIX) 40 MG tablet Take 1 tablet (40 mg total) by mouth daily. 01/13/17  Yes Biagio Borg, MD  gabapentin (NEURONTIN) 100 MG capsule TAKE 1 CAPSULE BY MOUTH THREE TIMES DAILY 01/16/17  Yes Biagio Borg, MD  guaiFENesin (MUCINEX) 600 MG 12 hr tablet Take 1 tablet (600 mg total) by mouth 2 (two) times daily. 03/18/17  Yes Biagio Borg, MD  hydrochlorothiazide (HYDRODIURIL) 25 MG tablet Take 25 mg by mouth daily. 03/31/17  Yes [provider]  montelukast (SINGULAIR) 10 MG tablet TAKE 1 TABLET BY MOUTH EVERY NIGHT AT BEDTIME 04/01/17  Yes Biagio Borg, MD  potassium chloride (K-DUR) 10 MEQ tablet Take 1 tablet (10 mEq total) by mouth daily. 03/26/17  Yes Regalado, Belkys A, MD  Probiotic CAPS Take 1 capsule by mouth every day 03/18/17  Yes Biagio Borg, MD  roflumilast (DALIRESP) 500 MCG TABS tablet Take 1 tablet (500 mcg total) by mouth daily. 03/18/17  Yes Biagio Borg, MD  umeclidinium bromide (INCRUSE ELLIPTA) 62.5 MCG/INH AEPB Inhale 1 puff into the lungs daily. 03/03/17  Yes Mannam, Hart Robinsons, MD       Physical Exam: BP 115/67 (BP Location: Left Arm)   Pulse 82   Temp 98.2 F (36.8 C)   Resp 20   Ht 5\' 1"  (1.549 m)   Wt 42.9 kg (94 lb 9.2 oz)   SpO2 100%   BMI 17.87 kg/m  General appearance: Well-developed, elderly adult female, alert and in no acute distress.   Eyes: Anicteric, conjunctiva pink,  lids and lashes normal. PERRL.    ENT: No nasal deformity, discharge, epistaxis.  Hearing normal. OP moist without lesions.   Neck: No neck masses.  Trachea midline.  No thyromegaly/tenderness. Lymph: No cervical or supraclavicular lymphadenopathy. Skin: Warm and dry.  No jaundice.  No suspicious rashes or lesions. Cardiac: RRR, nl S1-S2, no murmurs appreciated.  Capillary refill is brisk.  JVP not visible.  No LE edema.  Radial and DP pulses 2+ and symmetric. Respiratory: Normal respiratory rate and rhythm.  CTAB without rales or wheezes. Abdomen: Abdomen soft.  No TTP. No ascites, distension, hepatosplenomegaly.   MSK: No deformities or effusions.  No cyanosis or clubbing. Neuro: Cranial nerves normal.  Sensation intact to light  touch. Speech is fluent.  Muscle strength globally weak.    Psych: Sensorium intact and responding to questions, memory impaired, attention diminished. Affect blunted.  Judgment and insight appear impaired.     Labs on Admission:  I have personally reviewed following labs and imaging studies: CBC:  Recent Labs Lab 04/07/17 1829  WBC 15.0*  HGB 12.7  HCT 36.1  MCV 92.6  PLT 182   Basic Metabolic Panel:  Recent Labs Lab 04/07/17 1829  NA 134*  K <2.0*  CL 79*  CO2 40*  GLUCOSE 125*  BUN 25*  CREATININE 0.82  CALCIUM 8.3*   GFR: Estimated Creatinine Clearance: 42 mL/min (by C-G formula based on SCr of 0.82 mg/dL).  Liver Function Tests: No results for input(s): AST, ALT, ALKPHOS, BILITOT, PROT, ALBUMIN in the last 168 hours. No results for input(s): LIPASE, AMYLASE in the last 168 hours. No results for input(s): AMMONIA in the last 168 hours. Coagulation Profile: No results for input(s): INR, PROTIME in the last 168 hours. Cardiac Enzymes:  Recent Labs Lab 04/07/17 2244  TROPONINI 0.18*   BNP (last 3 results)  Recent Labs  01/13/17 1723  PROBNP 42.0   HbA1C: No results for input(s): HGBA1C in the last 72 hours. CBG:  Recent  Labs Lab 04/07/17 2011  GLUCAP 146*   Lipid Profile: No results for input(s): CHOL, HDL, LDLCALC, TRIG, CHOLHDL, LDLDIRECT in the last 72 hours. Thyroid Function Tests: No results for input(s): TSH, T4TOTAL, FREET4, T3FREE, THYROIDAB in the last 72 hours. Anemia Panel: No results for input(s): VITAMINB12, FOLATE, FERRITIN, TIBC, IRON, RETICCTPCT in the last 72 hours. Sepsis Lab: Invalid input(s): PROCALCITONIN, LACTICIDVEN No results found for this or any previous visit (from the past 240 hour(s)).       Radiological Exams on Admission: Personally reviewed CT head report; CXR personally reviewed, showed no focal opacity or edema: Ct Head Wo Contrast  Result Date: 04/07/2017 CLINICAL DATA:  Multiple syncopal episodes EXAM: CT HEAD WITHOUT CONTRAST TECHNIQUE: Contiguous axial images were obtained from the base of the skull through the vertex without intravenous contrast. COMPARISON:  CT 03/20/2017, MRI 05/11/2014 FINDINGS: Brain: Mild atrophy. Mild chronic microvascular ischemic change in the white matter. Negative for acute infarct or hemorrhage. Densely calcified meningioma along the anterior falx on the right is unchanged measuring 18 x 11 mm Vascular: Negative for hyperdense vessel Skull: Negative Sinuses/Orbits: Negative Other: None IMPRESSION: Atrophy and chronic microvascular ischemia. Stable calcified meningioma anteriorly No acute abnormality. Electronically Signed   By: Franchot Gallo M.D.   On: 04/07/2017 19:32   Dg Chest Port 1 View  Result Date: 04/07/2017 CLINICAL DATA:  Pt comes from home via [facility]. Pt has had multiple syncopal episodes and denied EMS several times. EXAM: PORTABLE CHEST 1 VIEW COMPARISON:  03/22/2017 FINDINGS: The heart size and mediastinal contours are within normal limits. Both lungs are clear. The visualized skeletal structures are unremarkable. IMPRESSION: No active disease. Electronically Signed   By: Nolon Nations M.D.   On: 04/07/2017 19:09     EKG: Independently reviewed. Rate 88, QTc 561.  No ST changes.  LHC report 2012: Report reviewed No significant coronary disease  Echocardiogram Uuly 2018: Report reviewed  EF 55-60% Grade I DD PAP 38    Assessment/Plan  1. Hypokalemia:  Severe.  From furosemide and HCTZ vs from alkalosis.   -Supplement mag, supplement daily -IV fluids with K -Hold furosemide and HCTZ -Obtain VBG  2. Syncope:  From hypokalemia -IVF overnight  3.  Troponin elevation:  Doubt ACS, suspect type II NSTEMI. -Trend troponin -Consult to cardiology  4. Prolonged QT interval:  -Correct K, mag -Hold SSRI, Aricept -Monitor on telemetry  5. Encephalopathy:  In the setting of mild cognitive impairment severe hypokalemia -Hold benzodiazepine and gabapentin for now  6. COPD/asthma:  -Continue home inhalers -Continue Singulair, Daliresp  7. Chronic diastolic CHF:  Stable -Hold furosemide for now  8. Other medications: -Continue lipitor, aspirin -Hold SSRI           DVT prophylaxis: Lovenox  Code Status: FULL  Family Communication: None present  Disposition Plan: Anticipate Supplement electrolytes, monitor mental status Consults called: Cardiology Admission status: INPATIENT           Medical decision making: Patient seen at 9:00 PM on 04/07/2017.  The patient was discussed with Dr. Roderic Palau.  What exists of the patient's chart was reviewed in depth and summarized above.  Clinical condition: stable.        Edwin Dada [facility] Pager [phone]

## 2017-04-08 NOTE — Telephone Encounter (Signed)
No, she said that it was signed and dated by Dr Jenny Reichmann but that information was missing. I can call her to see if they can refax it if you no longer have the form.

## 2017-04-08 NOTE — Progress Notes (Signed)
CRITICAL VALUE ALERT  Critical Value:  K+ 2.4  Date & Time Notied:  04/08/17 823  Provider Notified: Dr. Tyrell Antonio  Orders Received/Actions taken: orders for runs of K+ placed

## 2017-04-08 NOTE — Progress Notes (Signed)
PROGRESS NOTE    Michele Martin  IWL:[id number] DOB: [birth year] DOA: 04/07/2017 PCP: Biagio Borg, MD    Brief Narrative: Michele Martin is a 71 y.o. female with a past medical history significant for mild cognitive impairment, COPD/asthma, and HFPEF who presents with falls.  The patient is a vague and unreliable historian.  She was admitted to the hospital 2 weeks ago for a COPD flare, since then has been back home.  She confirms to me that she has fallen, and has been extremely weak, and that this has been ongoing for days, but otherwise she can provide no temporal pattern or other positive findings.  Today, she fell numerous times, presumably called EMS herself to get help back up but refused transport until the last time.  ED course: -Afebrile, heart rate 87, respirations 14, pulse ox 99% on O2 by nasal cannula and blood pressure 116/77 -Na 134, K <2, Cr 0.8, WBC 15K, Hgb 12.7 -UA with hyaline casts, no RBC, WBCs -Troponin 0.15 -CXR clear -CT head no acute change -ECG showed prolonged QT interval -Bicarb significantly elevated -She was given Mag, K and the case was discussed with Cardiology who doubted ACS and recommended medical admission with Cardiology consultation tomorrow    Assessment & Plan:   Principal Problem:   Hypokalemia Active Problems:   Chronic bronchitis (HCC)   Mild cognitive impairment   Chronic diastolic CHF (congestive heart failure) (HCC)   Elevated troponin   Hyponatremia   Pressure injury of skin   1-Severe hypokalemia;  IV fluids with 40 meq of potasium I have order 4 runs KCL--patient unable to tolerate IV KCL.  Will order 40 mep times 3.  Repeat Bmet this afternoon.  Lasix on hold.   2-COPD; continue with Incruse Ellipta, roflimilast.  Will add schedule albuterol/    3-Diarrhea; report 3 BM per day loose. No watery. No fever, no abdominal pain.   4-Syncope ; related to hypokalemia, hypovolemia.   5-Prolong QT; replete k  and check mg level. Received IV mg.  -Hold SSRI, Aricept  6-Elevation of troponin; demand ischemia. Evaluated by cardio.   Acute encephalopathy.  In the setting of mild cognitive impairment severe hypokalemia resume  benzodiazepine and gabapentin for now  DVT prophylaxis: Lovenox.  Code Status: Full code.  Family Communication: care discussed with sister.  Disposition Plan: to be determine   Consultants:   palliative  Procedures: none   Antimicrobials: none   Subjective: She is feeling better. She pass out twice at home.  She has been having loose stool, 3 BM per day.    Objective: Vitals:   04/07/17 2142 04/07/17 2224 04/08/17 0511 04/08/17 1020  BP: 122/74 115/67 131/64   Pulse: 82 82 80   Resp: 16 20 20    Temp:  98.2 F (36.8 C) 98.3 F (36.8 C)   TempSrc:   Oral   SpO2: 99% 100% 100% 100%  Weight:  42.9 kg (94 lb 9.2 oz)    Height:  5\' 1"  (1.549 m)      Intake/Output Summary (Last 24 hours) at 04/08/17 1542 Last data filed at 04/08/17 0858  Gross per 24 hour  Intake           918.33 ml  Output                0 ml  Net           918.33 ml   Filed Weights   04/07/17 1750 04/07/17 2224  Weight: 39.5 kg (87 lb) 42.9 kg (94 lb 9.2 oz)    Examination:  General exam: Appears calm and comfortable  Respiratory system: Clear to auscultation. Respiratory effort normal. Cardiovascular system: S1 & S2 heard, RRR. No JVD, murmurs, rubs, gallops or clicks. No pedal edema. Gastrointestinal system: Abdomen is nondistended, soft and nontender. No organomegaly or masses felt. Normal bowel sounds heard. Central nervous system: Alert and oriented. No focal neurological deficits. Extremities: Symmetric 5 x 5 power. Skin: No rashes, lesions or ulcers Psychiatry: Judgement and insight appear normal. Mood & affect appropriate.     Data Reviewed: I have personally reviewed following labs and imaging studies  CBC:  Recent Labs Lab 04/07/17 1829 04/08/17 0609    WBC 15.0* 12.4*  HGB 12.7 12.0  HCT 36.1 34.4*  MCV 92.6 93.2  PLT 242 161   Basic Metabolic Panel:  Recent Labs Lab 04/07/17 1829 04/08/17 0609  NA 134* 131*  K <2.0* 2.4*  CL 79* 81*  CO2 40* 38*  GLUCOSE 125* 103*  BUN 25* 23*  CREATININE 0.82 0.74  CALCIUM 8.3* 8.5*  MG  --  1.8   GFR: Estimated Creatinine Clearance: 43 mL/min (by C-G formula based on SCr of 0.74 mg/dL). Liver Function Tests: No results for input(s): AST, ALT, ALKPHOS, BILITOT, PROT, ALBUMIN in the last 168 hours. No results for input(s): LIPASE, AMYLASE in the last 168 hours. No results for input(s): AMMONIA in the last 168 hours. Coagulation Profile: No results for input(s): INR, PROTIME in the last 168 hours. Cardiac Enzymes:  Recent Labs Lab 04/07/17 2244 04/08/17 0609  TROPONINI 0.18* 0.15*   BNP (last 3 results)  Recent Labs  01/13/17 1723  PROBNP 42.0   HbA1C: No results for input(s): HGBA1C in the last 72 hours. CBG:  Recent Labs Lab 04/07/17 2011  GLUCAP 146*   Lipid Profile: No results for input(s): CHOL, HDL, LDLCALC, TRIG, CHOLHDL, LDLDIRECT in the last 72 hours. Thyroid Function Tests: No results for input(s): TSH, T4TOTAL, FREET4, T3FREE, THYROIDAB in the last 72 hours. Anemia Panel: No results for input(s): VITAMINB12, FOLATE, FERRITIN, TIBC, IRON, RETICCTPCT in the last 72 hours. Sepsis Labs: No results for input(s): PROCALCITON, LATICACIDVEN in the last 168 hours.  No results found for this or any previous visit (from the past 240 hour(s)).       Radiology Studies: Ct Head Wo Contrast  Result Date: 04/07/2017 CLINICAL DATA:  Multiple syncopal episodes EXAM: CT HEAD WITHOUT CONTRAST TECHNIQUE: Contiguous axial images were obtained from the base of the skull through the vertex without intravenous contrast. COMPARISON:  CT 03/20/2017, MRI 05/11/2014 FINDINGS: Brain: Mild atrophy. Mild chronic microvascular ischemic change in the white matter. Negative for  acute infarct or hemorrhage. Densely calcified meningioma along the anterior falx on the right is unchanged measuring 18 x 11 mm Vascular: Negative for hyperdense vessel Skull: Negative Sinuses/Orbits: Negative Other: None IMPRESSION: Atrophy and chronic microvascular ischemia. Stable calcified meningioma anteriorly No acute abnormality. Electronically Signed   By: Franchot Gallo M.D.   On: 04/07/2017 19:32   Dg Chest Port 1 View  Result Date: 04/07/2017 CLINICAL DATA:  Pt comes from home via [facility]. Pt has had multiple syncopal episodes and denied EMS several times. EXAM: PORTABLE CHEST 1 VIEW COMPARISON:  03/22/2017 FINDINGS: The heart size and mediastinal contours are within normal limits. Both lungs are clear. The visualized skeletal structures are unremarkable. IMPRESSION: No active disease. Electronically Signed   By: Nolon Nations M.D.   On:  04/07/2017 19:09        Scheduled Meds: . aspirin EC  81 mg Oral QHS  . atorvastatin  20 mg Oral QHS  . chlorhexidine  15 mL Mouth Rinse BID  . enoxaparin (LOVENOX) injection  30 mg Subcutaneous QHS  . escitalopram  10 mg Oral Daily  . feeding supplement (ENSURE ENLIVE)  237 mL Oral BID BM  . fluticasone furoate-vilanterol  1 puff Inhalation Daily  . mouth rinse  15 mL Mouth Rinse q12n4p  . montelukast  10 mg Oral QHS  . multivitamin with minerals  1 tablet Oral Daily  . nystatin  5 mL Oral QID  . potassium chloride  40 mEq Oral Q3H  . roflumilast  500 mcg Oral Daily  . umeclidinium bromide  1 puff Inhalation Daily   Continuous Infusions: . 0.9 % NaCl with KCl 40 mEq / L 100 mL/hr (04/08/17 1307)     LOS: 1 day    Time spent: 35 minutes.     Elmarie Shiley, MD [facility] Pager [phone]  If 7PM-7AM, please contact night-coverage [email or web address] Password [facility] 04/08/2017, 3:42 PM

## 2017-04-08 NOTE — Evaluation (Signed)
Physical Therapy Evaluation Patient Details Name: Michele Martin MRN: [id number] DOB: 08/24/1945 Today's Date: 04/08/2017   History of Present Illness  71 yo female admitted with hypokalemia, falls at home, weakness. hx of COPD-O2 dep, OA, chronic pain, cognitive impairment, tremor.     Clinical Impression  On eval, pt required Min assist for mobility. She walked ~75 feet with a RW. Pt presents with general weakness, decreased activity tolerance, and impaired gait and balance. She remains at risk for falls. Discussed d/c plan-pt is open to speaking with a CSW about SNF process. Will follow and progress activity as tolerated.     Follow Up Recommendations SNF (HHPT; 24 hour supervision/assist if pt declines placement)    Equipment Recommendations  None recommended by PT    Recommendations for Other Services       Precautions / Restrictions Precautions Precautions: Fall Precaution Comments: monitor O2 sats Restrictions Weight Bearing Restrictions: No      Mobility  Bed Mobility Overal bed mobility: Needs Assistance Bed Mobility: Supine to Sit     Supine to sit: Supervision;HOB elevated     General bed mobility comments: for safety, lines. Increased time.   Transfers Overall transfer level: Needs assistance Equipment used: 1 person hand held assist;Rolling walker (2 wheeled) Transfers: Sit to/from Stand           General transfer comment: sit to stand x 2. Assist to rise, stabilize, control descent. Stand pivot, bed to bsc, with 1 HHA. Pt is unsteady.   Ambulation/Gait Ambulation/Gait assistance: Min assist Ambulation Distance (Feet): 75 Feet Assistive device: Rolling walker (2 wheeled) Gait Pattern/deviations: Step-through pattern;Decreased stride length     General Gait Details: Assist to steady. slow gait speed. Remained on [place] O2. Dyspnea 2-3/4. Distance limited by fatigue. Recliner used to transport pt back to room.   Stairs             Wheelchair Mobility    Modified Rankin (Stroke Patients Only)       Balance Overall balance assessment: Needs assistance;History of Falls         Standing balance support: Bilateral upper extremity supported Standing balance-Leahy Scale: Poor                               Pertinent Vitals/Pain Pain Assessment: No/denies pain    Home Living Family/patient expects to be discharged to:: Private residence Living Arrangements: Alone Available Help at Discharge: Friend(s);Family;Available PRN/intermittently Type of Home: House Home Access: Stairs to enter Entrance Stairs-Rails: Can reach both Entrance Stairs-Number of Steps: 2 + 3 Home Layout: One level Home Equipment: Grab bars - toilet;Grab bars - tub/shower;Shower seat;Walker - 4 wheels      Prior Function Level of Independence: Independent with assistive device(s)               Hand Dominance        Extremity/Trunk Assessment   Upper Extremity Assessment Upper Extremity Assessment: Generalized weakness    Lower Extremity Assessment Lower Extremity Assessment: Generalized weakness    Cervical / Trunk Assessment Cervical / Trunk Assessment: Kyphotic  Communication   Communication: No difficulties  Cognition Arousal/Alertness: Awake/alert Behavior During Therapy: WFL for tasks assessed/performed Overall Cognitive Status: Within Functional Limits for tasks assessed                                 General Comments: per  chart, mild cognitive impairment      General Comments      Exercises     Assessment/Plan    PT Assessment Patient needs continued PT services  PT Problem List Decreased strength;Decreased mobility;Decreased activity tolerance;Decreased balance;Decreased knowledge of use of DME;Decreased skin integrity;Cardiopulmonary status limiting activity       PT Treatment Interventions DME instruction;Gait training;Therapeutic activities;Therapeutic  exercise;Patient/family education;Balance training;Functional mobility training    PT Goals (Current goals can be found in the Care Plan section)  Acute Rehab PT Goals Patient Stated Goal: get better, be able to walk and breathe PT Goal Formulation: With patient Time For Goal Achievement: 04/22/17 Potential to Achieve Goals: Good    Frequency Min 3X/week   Barriers to discharge        Co-evaluation               AM-PAC PT "6 Clicks" Daily Activity  Outcome Measure Difficulty turning over in bed (including adjusting bedclothes, sheets and blankets)?: A Little Difficulty moving from lying on back to sitting on the side of the bed? : A Little Difficulty sitting down on and standing up from a chair with arms (e.g., wheelchair, bedside commode, etc,.)?: Unable Help needed moving to and from a bed to chair (including a wheelchair)?: A Little Help needed walking in hospital room?: A Little Help needed climbing 3-5 steps with a railing? : A Lot 6 Click Score: 15    End of Session Equipment Utilized During Treatment: Gait belt;Oxygen Activity Tolerance: Patient limited by fatigue Patient left: in chair;with call bell/phone within reach;with chair alarm set   PT Visit Diagnosis: Muscle weakness (generalized) (M62.81);Difficulty in walking, not elsewhere classified (R26.2)    Time: 6503-5465 PT Time Calculation (min) (ACUTE ONLY): 13 min   Charges:   PT Evaluation $PT Eval Low Complexity: 1 Low     PT G Codes:          Weston Anna, MPT Pager: [phone]

## 2017-04-08 NOTE — Telephone Encounter (Signed)
Yes please, more than likely that form has been sent to scan.

## 2017-04-08 NOTE — Consult Note (Signed)
Cardiology Consultation:   Patient ID: Michele Martin; [id number]; 09-Jan-1946   Admit date: 04/07/2017 Date of Consult: 04/08/2017  Primary Care Provider: Biagio Borg, MD Primary Cardiologist: Johnsie Cancel Primary Electrophysiologist:  none   Patient Profile:   Michele Martin is a 71 y.o. female with a hx of COPD stage IV, diastolic CHF, atypical chest pain, cholesterol, anxiety/depression, dementia who is being seen today for the evaluation of elevated troponin at the request of Dr. Tyrell Antonio.  History of Present Illness:   Michele Martin was last seen in the office by Dr. Johnsie Cancel on July 9,2018 in consultation for CHF as recommended by Dr. Jenny Reichmann. The patient is a former smoker on 2L oxygen at home for moderate to severe emphysema.  Echocardiogram 07/27/16 showed EF 60-65% with grade 1 diastolic dysfunction.  She was hospitalized in May 2018 for sepsis, COPD exacerbation and ? Diastolic dysfunction. She was hydrated per sepsis protocol.  She developed chest pain and ruled out with no EKG changes and negative troponin x 3. Etiology felt to be due to COPD and PNA.  She was diuresed and discharged on Lasix 20mg  daily.    She had complaints of chest pain on 01/13/17 described as atypical and was intermittent, sharp and dull sometimes, fleeting lasting seconds, not exertional or positional with no radiation.   She presented to the ER with generalized weakness, SOB and frequent falls. Per [facility] nurse her BP had been trending downward for the past week.  She is an extremely poor historian and difficult to keep on track of the current topic.  She states that intermittently she will have chest pain that is vague in description and not associated with N/Diaphoresis.  It lasts a few minutes and resolve and usually occurs 1-2 times per month. She has also had SOB recently.  In ER her PCO2 was 62 and ph 7.533 with PO2 192 and bicarb 50.  Head CT was negative for acute abnormality but showed stable right  frontal parafalcine meningioma.  Chest xray was clear.   WBC was elevated at 15.  K severely reduced at < 2.  Troponin noted to be elevated at 0.15.  She was admitted with severe respiratory failure with respiratory acidosis and metabolic alkalosis secondary to COPD exacerbation with sepsis (patient hypotensive and tachycardic with elevated lactic acidosis), severe hypokalemia and potassium and Mag replaced.  She was fluid resuscitated with resolution of hypotension.  She has been started on IV antibx, IV prednisone.  Electrolyte abnormalites felt to be due to diuretics with reduced PO intake. Na 127 from dehydration.  QTc prolonged due to marked hypokalemia.     Past Medical History:  Diagnosis Date  . Acute angle-closure glaucoma 02/14/2010  . ALLERGIC RHINITIS 09/19/2009  . ANXIETY 04/15/2010  . Arthritis   . ASTHMA 09/19/2009  . BACK PAIN 02/14/2010  . Chronic bronchitis 04/14/2011  . CHRONIC OBSTRUCTIVE PULMONARY DISEASE, ACUTE EXACERBATION 12/19/2009  . Complication of anesthesia   . COPD 06/27/2009  . DEPRESSION 09/19/2009  . HOARSENESS 10/17/2009  . HYPERLIPIDEMIA 09/19/2009  . MENOPAUSAL DISORDER 08/20/2010  . OSTEOPENIA 09/10/2010  . Osteoporosis, unspecified 04/20/2014  . Rotator cuff tear 06/23/2011  . TINNITUS 09/10/2010  . TREMOR 02/14/2010  . Tremor 07/30/2011  . URI 09/10/2010  . Wheezing 06/27/2009    Past Surgical History:  Procedure Laterality Date  . ABDOMINAL HYSTERECTOMY    . cervical fusion 2013 - Dr Vertell Limber    . Normal Heart Cath  2001   Dr. Johnsie Cancel  .  s/p laser tx for glaucoma     no vision loss     Inpatient Medications: Scheduled Meds: . aspirin EC  81 mg Oral QHS  . atorvastatin  20 mg Oral QHS  . chlorhexidine  15 mL Mouth Rinse BID  . enoxaparin (LOVENOX) injection  30 mg Subcutaneous QHS  . escitalopram  10 mg Oral Daily  . fluticasone furoate-vilanterol  1 puff Inhalation Daily  . mouth rinse  15 mL Mouth Rinse q12n4p  . montelukast  10 mg Oral QHS  .  roflumilast  500 mcg Oral Daily  . umeclidinium bromide  1 puff Inhalation Daily   Continuous Infusions: . potassium chloride     PRN Meds: acetaminophen **OR** acetaminophen, ondansetron **OR** ondansetron (ZOFRAN) IV  Allergies:    Allergies  Allergen Reactions  . Pravastatin Itching and Other (See Comments)    Reaction:  Muscle cramps   . Simvastatin Itching and Other (See Comments)    Reaction:  Muscle cramps   . Statins Itching and Other (See Comments)    Reaction:  Muscle cramps    Social History:   Social History   Social History  . Marital status: Widowed    Spouse name: N/A  . Number of children: 2  . Years of education: N/A   Occupational History  . 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 Smoking status: Former Smoker    Packs/day: 0.50    Years: 50.00    Types: Cigarettes    Start date: 07/03/2015  . Smokeless tobacco: Never Used     Comment: stopped smoking for 1 month  . Alcohol use No  . Drug use: No  . Sexual activity: No   Other Topics Concern  . Not on file   Social History Narrative  . No narrative on file    Family History:  Family History  Problem Relation Age of Onset  . Dementia Mother   . Rheum arthritis Maternal Uncle   . Colon cancer Neg Hx      ROS:  Please see the history of present illness.  ROS  All other ROS reviewed and negative.     Physical Exam/Data:   Vitals:   04/07/17 2134 04/07/17 2142 04/07/17 2224 04/08/17 0511  BP: (!) 83/50 122/74 115/67 131/64  Pulse: 82 82 82 80  Resp: 15 16 20 20   Temp:   98.2 F (36.8 C) 98.3 F (36.8 C)  TempSrc:    Oral  SpO2: 100% 99% 100% 100%  Weight:   94 lb 9.2 oz (42.9 kg)   Height:   5\' 1"  (1.549 m)     Intake/Output Summary (Last 24 hours) at 04/08/17 0915 Last data filed at 04/08/17 0858  Gross per 24 hour  Intake           918.33 ml  Output                0 ml  Net           918.33 ml   Filed  Weights   04/07/17 1750 04/07/17 2224  Weight: 87 lb (39.5 kg) 94 lb 9.2 oz (42.9 kg)   Body mass index is 17.87 kg/m.  General:  Well nourished, well developed, in no acute distress, on  nasal cannula, frail elderly caucasian female HEENT: normal Lymph: no adenopathy Neck: no JVD Endocrine:  No thryomegaly Vascular: No carotid bruits; FA pulses 2+ bilaterally without bruits  Cardiac:  normal S1, S2; RRR; no murmur  Lungs:  clear to auscultation bilaterally, no wheezing, rhonchi or rales  Abd: soft, nontender, no hepatomegaly  Ext: no edema Musculoskeletal:  No deformities, BUE and BLE strength normal and equal Skin: warm and dry  Neuro:  CNs 2-12 intact, no focal abnormalities noted Psych:  Normal affect   EKG:  The EKG was personally reviewed and demonstrates:  Sinus rhythm, HR 88, prolonged QT Telemetry:  Telemetry was personally reviewed and demonstrates:  NSR  Relevant CV Studies: 02/12/2017 ECHOCARDIOGRAM Study Conclusions  - Left ventricle: The cavity size was normal. Wall thickness was   normal. Systolic function was normal. The estimated ejection   fraction was in the range of 55% to 60%. Wall motion was normal;   there were no regional wall motion abnormalities. Doppler   parameters are consistent with abnormal left ventricular   relaxation (grade 1 diastolic dysfunction). - Pulmonary arteries: Systolic pressure was mildly increased. PA   peak pressure: 38 mm Hg (S).  Laboratory Data:  Chemistry Recent Labs Lab 04/07/17 1829 04/08/17 0609  NA 134* 131*  K <2.0* 2.4*  CL 79* 81*  CO2 40* 38*  GLUCOSE 125* 103*  BUN 25* 23*  CREATININE 0.82 0.74  CALCIUM 8.3* 8.5*  GFRNONAA >60 >60  GFRAA >60 >60  ANIONGAP 15 12    No results for input(s): PROT, ALBUMIN, AST, ALT, ALKPHOS, BILITOT in the last 168 hours. Hematology Recent Labs Lab 04/07/17 1829 04/08/17 0609  WBC 15.0* 12.4*  RBC 3.90 3.69*  HGB 12.7 12.0  HCT 36.1 34.4*  MCV 92.6 93.2  MCH  32.6 32.5  MCHC 35.2 34.9  RDW 12.6 12.9  PLT 242 253   Cardiac Enzymes Recent Labs Lab 04/07/17 2244 04/08/17 0609  TROPONINI 0.18* 0.15*    Recent Labs Lab 04/07/17 1838  TROPIPOC 0.15*    BNPNo results for input(s): BNP, PROBNP in the last 168 hours.  DDimer No results for input(s): DDIMER in the last 168 hours.  Radiology/Studies:  Ct Head Wo Contrast  Result Date: 04/07/2017 CLINICAL DATA:  Multiple syncopal episodes EXAM: CT HEAD WITHOUT CONTRAST TECHNIQUE: Contiguous axial images were obtained from the base of the skull through the vertex without intravenous contrast. COMPARISON:  CT 03/20/2017, MRI 05/11/2014 FINDINGS: Brain: Mild atrophy. Mild chronic microvascular ischemic change in the white matter. Negative for acute infarct or hemorrhage. Densely calcified meningioma along the anterior falx on the right is unchanged measuring 18 x 11 mm Vascular: Negative for hyperdense vessel Skull: Negative Sinuses/Orbits: Negative Other: None IMPRESSION: Atrophy and chronic microvascular ischemia. Stable calcified meningioma anteriorly No acute abnormality. Electronically Signed   By: Franchot Gallo M.D.   On: 04/07/2017 19:32   Dg Chest Port 1 View  Result Date: 04/07/2017 CLINICAL DATA:  Pt comes from home via [facility]. Pt has had multiple syncopal episodes and denied EMS several times. EXAM: PORTABLE CHEST 1 VIEW COMPARISON:  03/22/2017 FINDINGS: The heart size and mediastinal contours are within normal limits. Both lungs are clear. The visualized skeletal structures are unremarkable. IMPRESSION: No active disease. Electronically Signed   By: Nolon Nations M.D.   On: 04/07/2017 19:09    Assessment and Plan:   Elevated Troponin with hx of atypical chest pain: . Her troponins are elevated but with flat trend at 0.18, 0.15, 0.15 and c/w  demand ischemia in the setting of acute sepsis, acute respiratory failure from COPD exacerbation, dehydration and marked metabolic  derangements.  Pt does  have risk factors for CAD but unclear what patients quality of life and activity level at home is.  She tells me that she can do her ADLs but has not felt well enough to go out shopping as she is limited by her lung disease.  - Will get a 2D echo to assess LVF - if LVF normal then consider outpt nuclear stress test  Hypokalemia: < 2.0 on arrival - currently being repleted  Hypomagnesemia - currently being repleted  Prolonged QT: Likely secondary to severe hypokalemia and hypomagnesemia - Avoid QT prolonging medication  - recheck EKG in the AM after electrolyte abnormalities have resolved.  Encephalopathy: in the setting is severe metabolic derangements.  - this hopefully will clear with treatment of respiratory failure, sepsis and metabolic derangements.  COPD IV: on inhalers, continues to smoke. - treatment per [facility]  Diastolic CHF: EF 79-89% with G1DD -- diuretics on hold due to severe dehydration from diuretics and poor PO intake  Dementia: on Lexapro - if QT prolongation does not resolve with correction of electrolytes, may need to consider stopping SSRI  I have spent a total of 60 minutes with patient reviewing hospital records , telemetry, EKGs, labs and examining patient as well as establishing an assessment and plan that was discussed with the patient.  > 50% of time was spent in direct patient care.    Signed, Fransico Him, MD [facility] 04/08/2017

## 2017-04-09 ENCOUNTER — Other Ambulatory Visit (HOSPITAL_COMMUNITY): Payer: Medicare Other

## 2017-04-09 ENCOUNTER — Inpatient Hospital Stay (HOSPITAL_COMMUNITY): Payer: Medicare Other

## 2017-04-09 DIAGNOSIS — I5030 Unspecified diastolic (congestive) heart failure: Secondary | ICD-10-CM

## 2017-04-09 DIAGNOSIS — Z7189 Other specified counseling: Secondary | ICD-10-CM

## 2017-04-09 DIAGNOSIS — J441 Chronic obstructive pulmonary disease with (acute) exacerbation: Secondary | ICD-10-CM

## 2017-04-09 DIAGNOSIS — Z515 Encounter for palliative care: Secondary | ICD-10-CM

## 2017-04-09 LAB — BASIC METABOLIC PANEL
ANION GAP: 5 (ref 5–15)
BUN: 16 mg/dL (ref 6–20)
CO2: 32 mmol/L (ref 22–32)
CREATININE: 0.42 mg/dL — AB (ref 0.44–1.00)
Calcium: 8.7 mg/dL — ABNORMAL LOW (ref 8.9–10.3)
Chloride: 101 mmol/L (ref 101–111)
GFR calc non Af Amer: >60 mL/min (ref >60)
Glucose, Bld: 79 mg/dL (ref 65–99)
POTASSIUM: 4.5 mmol/L (ref 3.5–5.1)
SODIUM: 138 mmol/L (ref 135–145)

## 2017-04-09 LAB — CBC
HEMATOCRIT: 32.8 % — AB (ref 36.0–46.0)
HEMOGLOBIN: 11.1 g/dL — AB (ref 12.0–15.0)
MCH: 31.9 pg (ref 26.0–34.0)
MCHC: 33.8 g/dL (ref 30.0–36.0)
MCV: 94.3 fL (ref 78.0–100.0)
Platelets: 218 10*3/uL (ref 150–400)
RBC: 3.48 MIL/uL — AB (ref 3.87–5.11)
RDW: 13 % (ref 11.5–15.5)
WBC: 8.7 10*3/uL (ref 4.0–10.5)

## 2017-04-09 LAB — ECHOCARDIOGRAM COMPLETE
HEIGHTINCHES: 61 in
WEIGHTICAEL: 1513.24 [oz_av]

## 2017-04-09 MED ORDER — NYSTATIN 100000 UNIT/ML MT SUSP: 5.0000 mL | Freq: Four times a day (QID) | OROMUCOSAL | 0 refills | Status: AC

## 2017-04-09 NOTE — Clinical Social Work Note (Signed)
Clinical Social Work Assessment  Patient Details  Name: Michele Martin MRN: [id number] Date of Birth: Sep 19, 1945  Date of referral:  04/09/17               Reason for consult:  Facility Placement                Permission sought to share information with:  Facility Sport and exercise psychologist, Family Supports Permission granted to share information::  Yes, Verbal Permission Granted  Name::        Agency::     Relationship::     Contact Information:     Housing/Transportation Living arrangements for the past 2 months:  Single Family Home Source of Information:  Patient Patient Interpreter Needed:  None Criminal Activity/Legal Involvement Pertinent to Current Situation/Hospitalization:  No - Comment as needed Significant Relationships:  Adult Children, Other Family Members Lives with:  Self Do you feel safe going back to the place where you live?  Yes (PT recommending SNF) Need for family participation in patient care:  Yes (Comment)  Care giving concerns:  Patient from home alone. PT recommending SNF for ST rehab.  Social Worker assessment / plan:  CSW spoke with patient at bedside regarding consult for nursing home placement and PT recommendation for SNF. Patient noted to have mild cognitive impairment, patient was alert and oriented x 4 during assessment. Patient reported that she is agreeable to SNF for ST rehab. Patient reported that she resides alone and that her granddaughter comes over almost every day to assist her with things around the house. CSW explained SNF placement process. Patient granted CSW verbal permission to contact family. CSW contacted and spoke with patient's son regarding PT recommendation, patient's son reported that he is agreeable to SNF.   CSW completed FL2.  CSW informed by [facility] patient decided to discharge home with hospice.  CSW spoke with patient and confirmed her plan to discharge home.  CSW signing off, no other needs identified at this time.  Please consult if new needs arise.   Employment status:  Retired Nurse, adult PT Recommendations:  [place] / Referral to community resources:  [place]  Patient/Family's Response to care:  Patient appreciative of CSW assistance with discharge planning.  Patient/Family's Understanding of and Emotional Response to Diagnosis, Current Treatment, and Prognosis:  Patient was calm throughout assessment and verbalized some understanding of diagnosis. Patient reported that she has support from family.  Emotional Assessment Appearance:  Appears stated age Attitude/Demeanor/Rapport:  Other (cooperative) Affect (typically observed):  Calm Orientation:  Oriented to Self, Oriented to Place, Oriented to  Time, Oriented to Situation Alcohol / Substance use:  Not Applicable Psych involvement (Current and /or in the community):  No (Comment)  Discharge Needs  Concerns to be addressed:  Care Coordination Readmission within the last 30 days:  No Current discharge risk:  None Barriers to Discharge:  No Barriers Identified   Burnis Medin, LCSW 04/09/2017, 1:06 PM

## 2017-04-09 NOTE — Consult Note (Addendum)
   [facility] Inpatient Consult   04/09/2017  Nikolina Simerson Ebarb Aug 19, 1945 [id number]    [facility] Care Management follow up.   Made aware by inpatient RNCM that Mrs. Case will go home with hospice services.   Spoke with [facility] to make aware.   Chart reviewed and it appears Mrs. Stonebraker will have hospice services thru [facility].      Marthenia Rolling, MSN-Ed, RN,BSN [facility] Liaison [phone]

## 2017-04-09 NOTE — Progress Notes (Signed)
  Echocardiogram 2D Echocardiogram has been performed.  [name] 04/09/2017, 10:30 AM

## 2017-04-09 NOTE — NC FL2 (Signed)
[place] LEVEL OF CARE SCREENING TOOL     IDENTIFICATION  Patient Name: Michele Martin Birthdate: [birth year] Sex: female Admission Date (Current Location): 04/07/2017  [facility] and [place] Number:  Herbalist and Address:  [facility],  [place] [address] [place]      Provider Number: [id number]  Attending Physician Name and Address:  Elmarie Shiley, MD  Relative Name and Phone Number:       Current Level of Care: Hospital Recommended Level of Care: [place] Prior Approval Number:    Date Approved/Denied:   PASRR Number:    Discharge Plan: SNF    Current Diagnoses: Patient Active Problem List   Diagnosis Date Noted  . Pressure injury of skin 04/08/2017  . Prolonged Q-T interval on ECG   . Acute metabolic encephalopathy 16/07/930  . Hypochloremic alkalosis 03/20/2017  . Hyponatremia 03/20/2017  . Tachycardia 03/15/2017  . Rib pain on left side 03/13/2017  . Rib pain on right side 03/13/2017  . Elevated troponin 02/23/2017  . Malnutrition of moderate degree 02/23/2017  . Acute on chronic respiratory failure with hypoxia and hypercapnia ([place]) 02/22/2017  . Hypokalemia 02/22/2017  . Chronic diastolic CHF (congestive heart failure) ([place]) 01/13/2017  . Chronic respiratory failure with hypoxia and hypercapnia ([place]) 01/13/2017  . SIRS (systemic inflammatory response syndrome) ([place]) 12/05/2016  . Acute lower UTI 12/05/2016  . Acute respiratory failure with hypoxia ([place]) 12/05/2016  . COPD with acute exacerbation ([place]) 12/05/2016  . Abnormal TSH 10/28/2016  . B12 deficiency 09/18/2016  . Abnormal finding on screening procedure 09/18/2016  . Risk for falls 08/27/2016  . Protein-calorie malnutrition, severe 08/06/2016  . COPD, group D, by GOLD 2017 classification ([place]) 08/05/2016  . Acute respiratory failure with hypoxia and hypercapnia ([place]) 07/26/2016  . Grief reaction 05/15/2016  . Rotator cuff  tear arthropathy of both shoulders 04/17/2016  . Neck pain on right side 03/13/2016  . Angioedema 10/23/2015  . Cough 09/07/2015  . Benign neoplasm of meninges ([place]) 08/29/2015  . Dizziness and giddiness 08/29/2015  . Mild cognitive impairment 08/10/2015  . Benign paroxysmal positional vertigo 08/10/2015  . Hyperlipidemia 08/10/2015  . Memory dysfunction 07/03/2015  . Solitary pulmonary nodule 05/09/2015  . Right hip pain 01/23/2015  . Chest pain 10/25/2014  . Right sided sciatica 10/11/2014  . Chronic pain syndrome 10/11/2014  . Osteoporosis 04/20/2014  . COPD exacerbation ([place]) 08/25/2013  . General weakness 12/22/2012  . Personal history of colonic polyps 10/25/2012  . Anterior neck pain 10/25/2012  . COPD GOLD III/ still smoking  08/24/2012  . Shingles 07/14/2012  . Orthostatic hypotension 05/07/2012  . Involuntary movements 11/19/2011  . Tremor 07/30/2011  . Rotator cuff tear 06/23/2011  . Lower back pain 06/23/2011  . Balance problem 06/23/2011  . Tobacco abuse 05/22/2011  . Preop exam for internal medicine 05/13/2011  . Chronic bronchitis ([place]) 04/14/2011  . Peripheral edema 01/13/2011  . Right shoulder pain 01/13/2011  . Fatigue 12/03/2010  . Encounter for well adult exam with abnormal findings 10/31/2010  . TINNITUS 09/10/2010  . MENOPAUSAL DISORDER 08/20/2010  . Anxiety and depression 04/15/2010  . Acute angle-closure glaucoma 02/14/2010  . HOARSENESS 10/17/2009  . Allergic rhinitis 09/19/2009    Orientation RESPIRATION BLADDER Height & Weight     Self, Time, Situation, Place  O2 (nasal cannula) Continent Weight: 94 lb 9.2 oz (42.9 kg) Height:  5\' 1"  (154.9 cm)  BEHAVIORAL SYMPTOMS/MOOD NEUROLOGICAL BOWEL NUTRITION STATUS  Continent Diet (regular)  AMBULATORY STATUS COMMUNICATION OF NEEDS Skin   Limited Assist Verbally PU Stage and Appropriate Care   PU Stage 2 Dressing:   (Partialthicknesslossofdermispresentingasashallowopenulcerwithared,pinkwoundbedwithoutslough.Location: Sacrum Location Orientation: Medial  PRN foam dressing)                   Personal Care Assistance Level of Assistance  Bathing, Feeding, Dressing Bathing Assistance: Limited assistance Feeding assistance: Independent Dressing Assistance: Limited assistance     Functional Limitations Info             [place]  PT (By licensed PT), OT (By licensed OT)     PT Frequency: 5x OT Frequency: 5x            Contractures Contractures Info: Not present    Additional Factors Info  Code Status, Allergies Code Status Info: Full code Allergies Info: Pravastatin; Simvastatin; Statins           Current Medications (04/09/2017):  This is the current hospital active medication list Current Facility-Administered Medications  Medication Dose Route Frequency Provider Last Rate Last Dose  . 0.9 %  sodium chloride infusion   Intravenous Continuous Regalado, Belkys A, MD 75 mL/hr at 04/09/17 0624    . acetaminophen (TYLENOL) tablet 650 mg  650 mg Oral Q6H PRN Danford, Suann Larry, MD       Or  . acetaminophen (TYLENOL) suppository 650 mg  650 mg Rectal Q6H PRN Danford, Suann Larry, MD      . albuterol (PROVENTIL) (2.5 MG/3ML) 0.083% nebulizer solution 2.5 mg  2.5 mg Nebulization TID Regalado, Belkys A, MD   2.5 mg at 04/09/17 0751  . aspirin EC tablet 81 mg  81 mg Oral QHS Edwin Dada, MD   81 mg at 04/08/17 2102  . atorvastatin (LIPITOR) tablet 20 mg  20 mg Oral QHS Edwin Dada, MD   20 mg at 04/08/17 2102  . chlorhexidine (PERIDEX) 0.12 % solution 15 mL  15 mL Mouth Rinse BID Edwin Dada, MD   15 mL at 04/08/17 2101  . clonazePAM (KLONOPIN) tablet 0.5 mg  0.5 mg Oral BID PRN Regalado, Belkys A, MD      . enoxaparin (LOVENOX) injection 30 mg  30 mg Subcutaneous QHS Danford, Suann Larry, MD   30 mg at 04/08/17  2102  . escitalopram (LEXAPRO) tablet 10 mg  10 mg Oral Daily Edwin Dada, MD   10 mg at 04/08/17 0952  . feeding supplement (ENSURE ENLIVE) (ENSURE ENLIVE) liquid 237 mL  237 mL Oral BID BM Regalado, Belkys A, MD   237 mL at 04/08/17 1400  . fluticasone furoate-vilanterol (BREO ELLIPTA) 200-25 MCG/INH 1 puff  1 puff Inhalation Daily Danford, Suann Larry, MD   1 puff at 04/09/17 0751  . gabapentin (NEURONTIN) capsule 100 mg  100 mg Oral TID Regalado, Belkys A, MD   100 mg at 04/08/17 2101  . MEDLINE mouth rinse  15 mL Mouth Rinse q12n4p Danford, Christopher P, MD      . montelukast (SINGULAIR) tablet 10 mg  10 mg Oral QHS Edwin Dada, MD   10 mg at 04/08/17 2102  . multivitamin with minerals tablet 1 tablet  1 tablet Oral Daily Regalado, Belkys A, MD   1 tablet at 04/08/17 1300  . nystatin (MYCOSTATIN) 100000 UNIT/ML suspension 500,000 Units  5 mL Oral QID Regalado, Belkys A, MD   500,000 Units at 04/08/17 2101  . ondansetron (ZOFRAN) tablet 4 mg  4 mg Oral Q6H PRN Danford, Suann Larry, MD       Or  . ondansetron (ZOFRAN) injection 4 mg  4 mg Intravenous Q6H PRN Danford, Suann Larry, MD      . roflumilast (DALIRESP) tablet 500 mcg  500 mcg Oral Daily Edwin Dada, MD   500 mcg at 04/08/17 0952  . umeclidinium bromide (INCRUSE ELLIPTA) 62.5 MCG/INH 1 puff  1 puff Inhalation Daily Danford, Suann Larry, MD   1 puff at 04/09/17 0751     Discharge Medications: Please see discharge summary for a list of discharge medications.  Relevant Imaging Results:  Relevant Lab Results:   Additional Information SSN [id number]  Burnis Medin, LCSW

## 2017-04-09 NOTE — Telephone Encounter (Signed)
Amber from hospice would like to know if Dewitt Hoes will be the attending please call back [phone] Patient is being discharged from [facility] today.

## 2017-04-09 NOTE — Progress Notes (Signed)
Progress Note  Patient Name: Michele Martin Date of Encounter: 04/09/2017  Primary Cardiologist: Michele Martin  Subjective   Significant tremor this AM, she says this is normal for her and it comes and goes. Requesting to go home because her son is in town from [place].    Inpatient Medications    Scheduled Meds: . albuterol  2.5 mg Nebulization TID  . aspirin EC  81 mg Oral QHS  . atorvastatin  20 mg Oral QHS  . chlorhexidine  15 mL Mouth Rinse BID  . enoxaparin (LOVENOX) injection  30 mg Subcutaneous QHS  . escitalopram  10 mg Oral Daily  . feeding supplement (ENSURE ENLIVE)  237 mL Oral BID BM  . fluticasone furoate-vilanterol  1 puff Inhalation Daily  . gabapentin  100 mg Oral TID  . mouth rinse  15 mL Mouth Rinse q12n4p  . montelukast  10 mg Oral QHS  . multivitamin with minerals  1 tablet Oral Daily  . nystatin  5 mL Oral QID  . roflumilast  500 mcg Oral Daily  . umeclidinium bromide  1 puff Inhalation Daily   Continuous Infusions: . sodium chloride 75 mL/hr at 04/09/17 0624   PRN Meds: acetaminophen **OR** acetaminophen, clonazePAM, ondansetron **OR** ondansetron (ZOFRAN) IV   Vital Signs    Vitals:   04/08/17 1933 04/08/17 2103 04/09/17 0622 04/09/17 0755  BP:  110/64 111/90   Pulse:  90 84   Resp:  16 16   Temp:  (!) 97.2 F (36.2 C) 98.1 F (36.7 C)   TempSrc:  Oral Oral   SpO2: 96% 100% 100% 100%  Weight:      Height:        Intake/Output Summary (Last 24 hours) at 04/09/17 0844 Last data filed at 04/09/17 0600  Gross per 24 hour  Intake             1145 ml  Output                0 ml  Net             1145 ml   Filed Weights   04/07/17 1750 04/07/17 2224  Weight: 87 lb (39.5 kg) 94 lb 9.2 oz (42.9 kg)    Telemetry    Sinus rhythm with some rare PVCs and PACs - Personally Reviewed   Physical Exam   GEN: Well nourished, well developed, elderly frail caucasian female. HEENT: normal  Neck: no JVD, carotid bruits, or masses Cardiac:  RRR. no murmurs, rubs, or gallops,no edema. Intact distal pulses bilaterally.  Respiratory: clear to auscultation bilaterally, normal work of breathing, on nasal cannula GI: soft, nontender, nondistended, + BS Michele: no deformity or atrophy  Skin: warm and dry, no rash Neuro: Alert and Oriented x 3, Strength and sensation are intact,  + Severe resting and intentional tremor Psych:   Full affect  Labs    Chemistry Recent Labs Lab 04/08/17 0609 04/08/17 1641 04/09/17 0519  NA 131* 135 138  K 2.4* 4.3 4.5  CL 81* 91* 101  CO2 38* 37* 32  GLUCOSE 103* 106* 79  BUN 23* 21* 16  CREATININE 0.74 0.59 0.42*  CALCIUM 8.5* 9.0 8.7*  GFRNONAA >60 >60 >60  GFRAA >60 >60 >60  ANIONGAP 12 7 5      Hematology Recent Labs Lab 04/07/17 1829 04/08/17 0609 04/09/17 0519  WBC 15.0* 12.4* 8.7  RBC 3.90 3.69* 3.48*  HGB 12.7 12.0 11.1*  HCT 36.1 34.4* 32.8*  MCV 92.6 93.2 94.3  MCH 32.6 32.5 31.9  MCHC 35.2 34.9 33.8  RDW 12.6 12.9 13.0  PLT 242 253 218    Cardiac Enzymes Recent Labs Lab 04/07/17 2244 04/08/17 0609  TROPONINI 0.18* 0.15*    Recent Labs Lab 04/07/17 1838  TROPIPOC 0.15*     BNPNo results for input(s): BNP, PROBNP in the last 168 hours.   DDimer No results for input(s): DDIMER in the last 168 hours.   Radiology    Ct Head Wo Contrast  Result Date: 04/07/2017 CLINICAL DATA:  Multiple syncopal episodes EXAM: CT HEAD WITHOUT CONTRAST TECHNIQUE: Contiguous axial images were obtained from the base of the skull through the vertex without intravenous contrast. COMPARISON:  CT 03/20/2017, MRI 05/11/2014 FINDINGS: Brain: Mild atrophy. Mild chronic microvascular ischemic change in the white matter. Negative for acute infarct or hemorrhage. Densely calcified meningioma along the anterior falx on the right is unchanged measuring 18 x 11 mm Vascular: Negative for hyperdense vessel Skull: Negative Sinuses/Orbits: Negative Other: None IMPRESSION: Atrophy and chronic  microvascular ischemia. Stable calcified meningioma anteriorly No acute abnormality. Electronically Signed   By: Michele Martin M.D.   On: 04/07/2017 19:32   Dg Chest Port 1 View  Result Date: 04/07/2017 CLINICAL DATA:  Pt comes from home via [facility]. Pt has had multiple syncopal episodes and denied EMS several times. EXAM: PORTABLE CHEST 1 VIEW COMPARISON:  03/22/2017 FINDINGS: The heart size and mediastinal contours are within normal limits. Both lungs are clear. The visualized skeletal structures are unremarkable. IMPRESSION: No active disease. Electronically Signed   By: Michele Martin M.D.   On: 04/07/2017 19:09    Cardiac Studies   02/12/2017 ECHOCARDIOGRAM Study Conclusions  - Left ventricle: The cavity size was normal. Wall thickness was normal. Systolic function was normal. The estimated ejection fraction was in the range of 55% to 60%. Wall motion was normal; there were no regional wall motion abnormalities. Doppler parameters are consistent with abnormal left ventricular relaxation (grade 1 diastolic dysfunction). - Pulmonary arteries: Systolic pressure was mildly increased. PA peak pressure: 38 mm Hg (S).  Patient Profile     Michele Martin is a 71 y.o. female with a hx of COPD stage IV, diastolic CHF, atypical chest pain, cholesterol, anxiety/depression, dementia who is being seen today for the evaluation of elevated troponin at the request of Dr. Tyrell Martin.  Assessment & Plan    Elevated Troponin:  troponins are elevated but with flat trend at 0.18, 0.15, 0.15. This is consistent with demand ischemia in the setting of acute respiratory failure from COPD exacerbation, dehydration and marked metabolic derangements.  -- Michele Martin has risk factors for CAD but perform her ADLs but is limited by her lung disease. -- Plan is for 2D Echo for assess LVF, will order this now, if normal consider outpatient nuclear stress test.  Resting and intentional tremor:  patient says this is normal for her and comes and goes  Resolved Hypokalemia: < 2.0 on arrival and now 4.5  Hypomagnesemia: primary team has replaced  Prolonged QT: Likely secondary to severe hypokalemia and hypomagnesemia. Will order EKG to re-evaluate. -- avoid QT prolonging medications  Encephalopathy: has resolved with correction of metabolic derangements.  COPD IV: inhalers, continues to smoke. Treatment per [facility]  Diastolic CHF: EF 62-69% with G1DD -- diuretics currently on hold due to presenting with severe dehydration from diarrhea, decreased PO and lasix.  Dementia: On Lexapro - if QT prolongation does not resolve with correction of  electrolytes, may need to consider stopping SSRI    Signed, Linus Mako, PA-C  04/09/2017, 8:44 AM

## 2017-04-09 NOTE — Discharge Summary (Signed)
Physician Discharge Summary  Michele Martin DQQ:[id number] DOB: 1946-01-09 DOA: 04/07/2017  PCP: Biagio Borg, MD  Admit date: 04/07/2017 Discharge date: 04/09/2017  Admitted From: Home  Disposition: Home with hospice.   Recommendations for Outpatient Follow-up:  1. Follow up with PCP in 1-2 weeks 2. Please obtain BMP/CBC in one week 3. Please if patient start retaining fluids, consider resuming lasix with increase dose of potasium.  4. Discharge with hospice.  5. Need B-met in 3 days to follow K level.   Home Health: yes.   Discharge Condition: Stable.  CODE STATUS: full code.  Diet recommendation: Heart Healthy  Brief/Interim Summary: Michele Martin a 71 y.o.femalewith a past medical history significant for mild cognitive impairment, COPD/asthma, and HFPEFwho presents with falls.  The patient is a vague and unreliable historian. She was admitted to the hospital 2 weeks ago for a COPD flare, since then has been back home. She confirms to me that she has fallen, and has been extremely weak, and that this has been ongoing for days, but otherwise she can provide no temporal pattern or other positive findings. Today, she fell numerous times, presumably called EMS herself to get help back up but refused transport until the last time.  ED course: -Afebrile, heart rate 87, respirations 14, pulse ox 99% on O2 bynasal cannulaand blood pressure 116/77 -Na 134, K <2, Cr 0.8, WBC 15K, Hgb 12.7 -UA with hyaline casts, no RBC, WBCs -Troponin 0.15 -CXR clear -CT head no acute change -ECG showed prolonged QT interval -Bicarb significantly elevated -She was given Darliss Cheney the case was discussed with Cardiologywho doubted ACS and recommended medical admission with Cardiology consultation tomorrow    Assessment & Plan:   Principal Problem:   Hypokalemia Active Problems:   Chronic bronchitis (HCC)   Mild cognitive impairment   Chronic diastolic CHF (congestive  heart failure) (HCC)   Elevated troponin   Hyponatremia   Pressure injury of skin   1-Severe hypokalemia;  Treated with IV fluids with 40 meq of potasium Received 40 meq PO times 3.  Discontinue lasix at discharge.  K at 4. Discharge on 68mq daily. Needs Bmet in 3 days.   2-COPD; continue with Incruse Ellipta, roflimilast.  Continue with home meds.    3-Diarrhea; report 3 BM per day loose. No watery. No fever, no abdominal pain.  resolved  4-Syncope ; related to hypokalemia, hypovolemia. ECHO with normal EF  5-Prolong QT; replete k and check mg level. Received IV mg.  resume  SSRI, Aricept -TQ normalized.   6-Elevation of troponin; demand ischemia. Evaluated by cardio.  no further ischemic work up,. ECHO normal EF>   Acute encephalopathy.  In the setting of mild cognitive impairment severe hypokalemia resume  benzodiazepine and gabapentin Resolved.   Goal of Care;  Patient with COPD, recurrent admission. She discussed Goals of care with Palliative. She is now DNR. She wants to go home with Hospice.   Discharge Diagnoses:  Principal Problem:   Hypokalemia Active Problems:   Chronic bronchitis (HCC)   Mild cognitive impairment   Chronic diastolic CHF (congestive heart failure) (HCC)   Elevated troponin   Hyponatremia   Pressure injury of skin   Prolonged Q-T interval on ECG    Discharge Instructions  Discharge Instructions    Diet - low sodium heart healthy    Complete by:  As directed    Increase activity slowly    Complete by:  As directed      Allergies as  of 04/09/2017      Reactions   Pravastatin Itching, Other (See Comments)   Reaction:  Muscle cramps    Simvastatin Itching, Other (See Comments)   Reaction:  Muscle cramps    Statins Itching, Other (See Comments)   Reaction:  Muscle cramps      Medication List    STOP taking these medications   furosemide 40 MG tablet Commonly known as:  LASIX   hydrochlorothiazide 25 MG  tablet Commonly known as:  HYDRODIURIL     TAKE these medications   albuterol 108 (90 Base) MCG/ACT inhaler Commonly known as:  PROVENTIL HFA Inhale 2 puffs into the lungs every 6 (six) hours as needed for wheezing or shortness of breath.   albuterol (2.5 MG/3ML) 0.083% nebulizer solution Commonly known as:  PROVENTIL INHALE 1 VIAL VIA NEBULIZER EVERY 6 HOURS AS NEEDED FOR WHEEZING OR FOR SHORTNESS OF BREATH   aspirin EC 81 MG tablet Take 1 tablet (81 mg total) by mouth at bedtime.   atorvastatin 20 MG tablet Commonly known as:  LIPITOR Take 1 tablet (20 mg total) by mouth at bedtime.   b complex vitamins tablet Take 1 tablet by mouth at bedtime.   clonazePAM 0.5 MG tablet Commonly known as:  KLONOPIN TAKE ONE (1) TABLET BY MOUTH TWICE DAILY AS NEEDED FOR ANXIETY   donepezil 10 MG tablet Commonly known as:  ARICEPT Take 1 tablet (10 mg total) by mouth at bedtime.   escitalopram 10 MG tablet Commonly known as:  LEXAPRO Take 10 mg by mouth daily.   feeding supplement (ENSURE ENLIVE) Liqd Take 237 mLs by mouth 2 (two) times daily between meals.   fluticasone furoate-vilanterol 200-25 MCG/INH Aepb Commonly known as:  BREO ELLIPTA Inhale 1 puff into the lungs daily.   gabapentin 100 MG capsule Commonly known as:  NEURONTIN TAKE 1 CAPSULE BY MOUTH THREE TIMES DAILY   guaiFENesin 600 MG 12 hr tablet Commonly known as:  MUCINEX Take 1 tablet (600 mg total) by mouth 2 (two) times daily.   montelukast 10 MG tablet Commonly known as:  SINGULAIR TAKE 1 TABLET BY MOUTH EVERY NIGHT AT BEDTIME   nystatin 100000 UNIT/ML suspension Commonly known as:  MYCOSTATIN Take 5 mLs (500,000 Units total) by mouth 4 (four) times daily.   potassium chloride 10 MEQ tablet Commonly known as:  K-DUR Take 1 tablet (10 mEq total) by mouth daily.   Probiotic Caps Take 1 capsule by mouth every day   roflumilast 500 MCG Tabs tablet Commonly known as:  DALIRESP Take 1 tablet (500 mcg  total) by mouth daily.   umeclidinium bromide 62.5 MCG/INH Aepb Commonly known as:  INCRUSE ELLIPTA Inhale 1 puff into the lungs daily.            Discharge Care Instructions        Start     Ordered   04/09/17 0000  nystatin (MYCOSTATIN) 100000 UNIT/ML suspension  4 times daily     04/09/17 1342   04/09/17 0000  Increase activity slowly     04/09/17 1342   04/09/17 0000  Diet - low sodium heart healthy     04/09/17 1342      Allergies  Allergen Reactions  . Pravastatin Itching and Other (See Comments)    Reaction:  Muscle cramps   . Simvastatin Itching and Other (See Comments)    Reaction:  Muscle cramps   . Statins Itching and Other (See Comments)    Reaction:  Muscle cramps  Consultations:  Cardiology  Pallaitive   Procedures/Studies: Dg Ribs Bilateral W/chest  Result Date: 03/13/2017 CLINICAL DATA:  Two week history of pain EXAM: BILATERAL RIBS AND CHEST - 4+ VIEW COMPARISON:  Chest radiograph February 24, 2017 FINDINGS: Frontal chest as well as oblique and cone-down lower rib images obtained. There is slight left base atelectasis. Lungs elsewhere are clear. Heart size and pulmonary vascularity are normal. No adenopathy. There is aortic atherosclerosis. There is no evident pneumothorax or pleural effusion. There is thoracolumbar levoscoliosis. There is postoperative change in the lower cervical region. No evident rib fracture. IMPRESSION: No evident rib fracture. Slight left base atelectasis. No edema or consolidation. There is aortic atherosclerosis. Aortic Atherosclerosis (ICD10-I70.0). Electronically Signed   By: Bretta Bang III M.D.   On: 03/13/2017 13:40   Ct Head Wo Contrast  Result Date: 04/07/2017 CLINICAL DATA:  Multiple syncopal episodes EXAM: CT HEAD WITHOUT CONTRAST TECHNIQUE: Contiguous axial images were obtained from the base of the skull through the vertex without intravenous contrast. COMPARISON:  CT 03/20/2017, MRI 05/11/2014 FINDINGS: Brain:  Mild atrophy. Mild chronic microvascular ischemic change in the white matter. Negative for acute infarct or hemorrhage. Densely calcified meningioma along the anterior falx on the right is unchanged measuring 18 x 11 mm Vascular: Negative for hyperdense vessel Skull: Negative Sinuses/Orbits: Negative Other: None IMPRESSION: Atrophy and chronic microvascular ischemia. Stable calcified meningioma anteriorly No acute abnormality. Electronically Signed   By: Marlan Palau M.D.   On: 04/07/2017 19:32   Ct Head Wo Contrast  Result Date: 03/20/2017 CLINICAL DATA:  Worsening shortness of breath and increasing weakness. EXAM: CT HEAD WITHOUT CONTRAST TECHNIQUE: Contiguous axial images were obtained from the base of the skull through the vertex without intravenous contrast. COMPARISON:  02/22/2017 FINDINGS: Brain: There is no evidence for acute hemorrhage, hydrocephalus, or abnormal extra-axial fluid collection. 17 mm right frontal parafalcine densely calcified lesion likely a meningioma, unchanged in the interval. No adjacent mass-effect or edema. No definite CT evidence for acute infarction. Diffuse loss of parenchymal volume is consistent with atrophy. Patchy low attenuation in the deep hemispheric and periventricular white matter is nonspecific, but likely reflects chronic microvascular ischemic demyelination. Vascular: No hyperdense vessel or unexpected calcification. Skull: No evidence for fracture. No worrisome lytic or sclerotic lesion. Sinuses/Orbits: The visualized paranasal sinuses and mastoid air cells are clear. Visualized portions of the globes and intraorbital fat are unremarkable. Other: None. IMPRESSION: 1. Stable exam.  No acute intracranial abnormality. 2. Next item atrophy with chronic small vessel white matter ischemic disease. 3. Stable appearance densely calcified right frontal parafalcine meningioma. Electronically Signed   By: Kennith Center M.D.   On: 03/20/2017 18:56   Dg Chest Port 1  View  Result Date: 04/07/2017 CLINICAL DATA:  Pt comes from home via [facility]. Pt has had multiple syncopal episodes and denied EMS several times. EXAM: PORTABLE CHEST 1 VIEW COMPARISON:  03/22/2017 FINDINGS: The heart size and mediastinal contours are within normal limits. Both lungs are clear. The visualized skeletal structures are unremarkable. IMPRESSION: No active disease. Electronically Signed   By: Norva Pavlov M.D.   On: 04/07/2017 19:09   Dg Chest Port 1 View  Result Date: 03/22/2017 CLINICAL DATA:  Dyspnea for 1 day.  History of asthma. EXAM: PORTABLE CHEST 1 VIEW COMPARISON:  03/20/2017 FINDINGS: Lucency in the upper lungs is similar to the previous examination. No focal airspace disease or pulmonary edema. Slightly prominent lung markings appear chronic. Heart size is normal. Atherosclerotic calcifications at the  aortic arch. Surgical plate in the cervical spine. IMPRESSION: No active disease. Electronically Signed   By: Markus Daft M.D.   On: 03/22/2017 09:27   Dg Chest Port 1 View  Result Date: 03/20/2017 CLINICAL DATA:  COPD exacerbation. EXAM: PORTABLE CHEST 1 VIEW COMPARISON:  Chest x-ray dated March 13, 2017. FINDINGS: The cardiomediastinal silhouette is normal in size. Normal pulmonary vascularity. Emphysematous changes again noted. No focal consolidation, pleural effusion, or pneumothorax. No acute osseous abnormality. IMPRESSION: COPD.  No active disease. Electronically Signed   By: Titus Dubin M.D.   On: 03/20/2017 15:52     Subjective: Patient feeling better. Does not wants to go to rehab from hospital. She wants to go home with hospice.    Discharge Exam: Vitals:   04/09/17 0622 04/09/17 0755  BP: 111/90   Pulse: 84   Resp: 16   Temp: 98.1 F (36.7 C)   SpO2: 100% 100%   Vitals:   04/08/17 1933 04/08/17 2103 04/09/17 0622 04/09/17 0755  BP:  110/64 111/90   Pulse:  90 84   Resp:  16 16   Temp:  (!) 97.2 F (36.2 C) 98.1 F (36.7 C)   TempSrc:   Oral Oral   SpO2: 96% 100% 100% 100%  Weight:      Height:        General: Pt is alert, awake, not in acute distress Cardiovascular: RRR, S1/S2 +, no rubs, no gallops Respiratory: CTA bilaterally, no wheezing, no rhonchi Abdominal: Soft, NT, ND, bowel sounds + Extremities: no edema, no cyanosis    The results of significant diagnostics from this hospitalization (including imaging, microbiology, ancillary and laboratory) are listed below for reference.     Microbiology: No results found for this or any previous visit (from the past 240 hour(s)).   Labs: BNP (last 3 results)  Recent Labs  07/26/16 1225 08/04/16 2242 03/20/17 1537  BNP 44.2 52.9 79.0   Basic Metabolic Panel:  Recent Labs Lab 04/07/17 1829 04/08/17 0609 04/08/17 1641 04/09/17 0519  NA 134* 131* 135 138  K <2.0* 2.4* 4.3 4.5  CL 79* 81* 91* 101  CO2 40* 38* 37* 32  GLUCOSE 125* 103* 106* 79  BUN 25* 23* 21* 16  CREATININE 0.82 0.74 0.59 0.42*  CALCIUM 8.3* 8.5* 9.0 8.7*  MG  --  1.8  --   --    Liver Function Tests: No results for input(s): AST, ALT, ALKPHOS, BILITOT, PROT, ALBUMIN in the last 168 hours. No results for input(s): LIPASE, AMYLASE in the last 168 hours. No results for input(s): AMMONIA in the last 168 hours. CBC:  Recent Labs Lab 04/07/17 1829 04/08/17 0609 04/09/17 0519  WBC 15.0* 12.4* 8.7  HGB 12.7 12.0 11.1*  HCT 36.1 34.4* 32.8*  MCV 92.6 93.2 94.3  PLT 242 253 218   Cardiac Enzymes:  Recent Labs Lab 04/07/17 2244 04/08/17 0609  TROPONINI 0.18* 0.15*   BNP: Invalid input(s): POCBNP CBG:  Recent Labs Lab 04/07/17 2011  GLUCAP 146*   D-Dimer No results for input(s): DDIMER in the last 72 hours. Hgb A1c No results for input(s): HGBA1C in the last 72 hours. Lipid Profile No results for input(s): CHOL, HDL, LDLCALC, TRIG, CHOLHDL, LDLDIRECT in the last 72 hours. Thyroid function studies No results for input(s): TSH, T4TOTAL, T3FREE, THYROIDAB in the  last 72 hours.  Invalid input(s): FREET3 Anemia work up No results for input(s): VITAMINB12, FOLATE, FERRITIN, TIBC, IRON, RETICCTPCT in the last 72 hours. Urinalysis  Component Value Date/Time   COLORURINE STRAW (A) 04/07/2017 1757   APPEARANCEUR CLEAR 04/07/2017 1757   LABSPEC 1.005 04/07/2017 1757   PHURINE 7.0 04/07/2017 1757   GLUCOSEU NEGATIVE 04/07/2017 1757   GLUCOSEU NEGATIVE 12/29/2016 1239   HGBUR SMALL (A) 04/07/2017 1757   BILIRUBINUR NEGATIVE 04/07/2017 1757   KETONESUR NEGATIVE 04/07/2017 1757   PROTEINUR NEGATIVE 04/07/2017 1757   UROBILINOGEN 0.2 12/29/2016 1239   NITRITE NEGATIVE 04/07/2017 1757   LEUKOCYTESUR MODERATE (A) 04/07/2017 1757   Sepsis Labs Invalid input(s): PROCALCITONIN,  WBC,  LACTICIDVEN Microbiology No results found for this or any previous visit (from the past 240 hour(s)).   Time coordinating discharge: Over 30 minutes  SIGNED:   Elmarie Shiley, MD  [facility] 04/09/2017, 1:43 PM Pager   If 7PM-7AM, please contact night-coverage [email or web address] Password [email or web address]

## 2017-04-09 NOTE — Progress Notes (Signed)
TC [employer] for portable 02 tank-they are unable to provide a portalbe travel tank to [employer] have any, & insurance will not pay for travel tank if you have a battery operated portable 02 concentrator-patient has battery operated portable 02 concentrator-she must bring that tank to the hospital for transport to home.Nurse informed.

## 2017-04-09 NOTE — Progress Notes (Signed)
Physical Therapy Treatment Patient Details Name: Michele Martin MRN: [id number] DOB: 02/01/1946 Today's Date: 04/09/2017    History of Present Illness 71 yo female admitted with hypokalemia, falls at home, weakness. hx of COPD-O2 dep, OA, chronic pain, cognitive impairment, tremor.     PT Comments    Pt ambulated improved distance in hallway today and reports desire to d/c home.  Pt uncertain if son will be able to assist at home however.  Pt would benefit from SNF upon d/c if agreeable.  Follow Up Recommendations  SNF;Supervision/Assistance - 24 hour     Equipment Recommendations  None recommended by PT    Recommendations for Other Services       Precautions / Restrictions Precautions Precautions: Fall Precaution Comments: monitor O2 sats, pt reports chronic 1L O2 at rest and 2-3L for activity at baseline    Mobility  Bed Mobility Overal bed mobility: Needs Assistance Bed Mobility: Supine to Sit;Sit to Supine     Supine to sit: Supervision;HOB elevated Sit to supine: Supervision;HOB elevated   General bed mobility comments: SpO2 99% at rest on 1L O2 [place]  Transfers Overall transfer level: Needs assistance Equipment used: Rolling walker (2 wheeled) Transfers: Sit to/from Stand Sit to Stand: Min guard;Min assist         General transfer comment: verbal cues for technique, min/guard from bed however required assist to rise from toilet level.  Ambulation/Gait Ambulation/Gait assistance: Min guard;Min assist Ambulation Distance (Feet): 200 Feet Assistive device: Rolling walker (2 wheeled) Gait Pattern/deviations: Step-through pattern;Decreased stride length     General Gait Details: occasional steadying assist, slow gait speed, SpO2 97% on 2L O2 [place]   Stairs            Wheelchair Mobility    Modified Rankin (Stroke Patients Only)       Balance                                            Cognition Arousal/Alertness:  Awake/alert Behavior During Therapy: WFL for tasks assessed/performed Overall Cognitive Status: Within Functional Limits for tasks assessed                                 General Comments: per chart, mild cognitive impairment      Exercises      General Comments        Pertinent Vitals/Pain Pain Assessment: No/denies pain    Home Living                      Prior Function            PT Goals (current goals can now be found in the care plan section) Acute Rehab PT Goals PT Goal Formulation: With patient Time For Goal Achievement: 04/23/17 Potential to Achieve Goals: Good Progress towards PT goals: Progressing toward goals    Frequency    Min 3X/week      PT Plan Current plan remains appropriate    Co-evaluation              AM-PAC PT "6 Clicks" Daily Activity  Outcome Measure  Difficulty turning over in bed (including adjusting bedclothes, sheets and blankets)?: A Little Difficulty moving from lying on back to sitting on the side of the bed? : A Little Difficulty sitting  down on and standing up from a chair with arms (e.g., wheelchair, bedside commode, etc,.)?: Unable Help needed moving to and from a bed to chair (including a wheelchair)?: A Little Help needed walking in hospital room?: A Little Help needed climbing 3-5 steps with a railing? : A Lot 6 Click Score: 15    End of Session Equipment Utilized During Treatment: Gait belt;Oxygen Activity Tolerance: Patient tolerated treatment well Patient left: with call bell/phone within reach;in bed;with family/visitor present;with bed alarm set Nurse Communication: Mobility status PT Visit Diagnosis: Difficulty in walking, not elsewhere classified (R26.2)     Time: 1660-6004 PT Time Calculation (min) (ACUTE ONLY): 22 min  Charges:  $Gait Training: 8-22 mins                    G Codes:      Carmelia Bake, PT, DPT 04/09/2017 Pager: [phone]  York Ram E 04/09/2017,  1:07 PM

## 2017-04-09 NOTE — Telephone Encounter (Signed)
LM with Crystal to fax the form over again

## 2017-04-09 NOTE — Consult Note (Signed)
Consultation Note Date: 04/09/2017   Patient Name: Michele Martin  DOB: 02-24-1946  MRN: [id number]  Age / Sex: 71 y.o., female  PCP: Biagio Borg, MD Referring Physician: Elmarie Shiley, MD  Reason for Consultation: Establishing goals of care  HPI/Patient Profile: 71 y.o. female  with past medical history of mild cognitive impairment, COPD, CHF admitted on 04/07/2017 with incrweased falls and decreased functional status.  Palliative consulted for goals of care.   Clinical Assessment and Goals of Care: I met today with Michele Martin and her son.   She reports that her family, remaining out of the hospital, and being comfortable are the most important things to her.  Values and goals of care important to patient and family were attempted to be elicited.  We discussed clinical course with continued decline she has suffered over the past few weeks with her nutrition, cognition, and functional status.  We also discussed that she has been told be home care staff that she would benefit from election of her hospice benefit if her goals is to remain comfortable and in her own home.  We discussed difference between a aggressive medical intervention path and a palliative, comfort focused care path.    Concepts specific to code status and rehospitalization discussed.  We discussed that in light of multiple chronic medical problems that have worsened with this acute problem, care should be focused on interventions that are likely to allow the patient to achieve goal of getting back to home and spending time with family. I discussed with her regarding heroic interventions at the end-of-life and she agree this would not be in line with her wishes for a natural death or be likely to lead to getting well enough to go back home. She was in agreement with changing CODE STATUS to DO NOT RESUSCITATE.  Questions and  concerns addressed.   PMT will continue to support holistically.  No documented HPOA, but she reports her son helps with medical decision making   SUMMARY OF RECOMMENDATIONS   - Home with hospice support  Code Status/Advance Care Planning: DNR   Psycho-social/Spiritual:   Desire for further Chaplaincy support:no  Additional Recommendations: Education on Hospice  Prognosis:   < 6 months  Discharge Planning: Home with Hospice      Primary Diagnoses: Present on Admission: . Hypokalemia . Chronic diastolic CHF (congestive heart failure) ([place]) . Chronic bronchitis ([place]) . Mild cognitive impairment . Hyponatremia . Elevated troponin   I have reviewed the medical record, interviewed the patient and family, and examined the patient. The following aspects are pertinent.  Past Medical History:  Diagnosis Date  . Acute angle-closure glaucoma 02/14/2010  . ALLERGIC RHINITIS 09/19/2009  . ANXIETY 04/15/2010  . Arthritis   . ASTHMA 09/19/2009  . BACK PAIN 02/14/2010  . Chronic bronchitis 04/14/2011  . CHRONIC OBSTRUCTIVE PULMONARY DISEASE, ACUTE EXACERBATION 12/19/2009  . Complication of anesthesia   . COPD 06/27/2009  . DEPRESSION 09/19/2009  . HOARSENESS 10/17/2009  . HYPERLIPIDEMIA 09/19/2009  . MENOPAUSAL DISORDER  08/20/2010  . OSTEOPENIA 09/10/2010  . Osteoporosis, unspecified 04/20/2014  . Rotator cuff tear 06/23/2011  . TINNITUS 09/10/2010  . TREMOR 02/14/2010  . Tremor 07/30/2011  . URI 09/10/2010  . Wheezing 06/27/2009   Social History   Social History  . Marital status: Widowed    Spouse name: N/A  . Number of children: 2  . Years of education: N/A   Occupational History  . 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 Smoking status: Former Smoker    Packs/day: 0.50    Years: 50.00    Types: Cigarettes    Start date: 07/03/2015  . Smokeless tobacco: Never Used     Comment: stopped smoking for 1 month    . Alcohol use No  . Drug use: No  . Sexual activity: No   Other Topics Concern  . None   Social History Narrative  . None   Family History  Problem Relation Age of Onset  . Dementia Mother   . Rheum arthritis Maternal Uncle   . Colon cancer Neg Hx    Scheduled Meds: . albuterol  2.5 mg Nebulization TID  . aspirin EC  81 mg Oral QHS  . atorvastatin  20 mg Oral QHS  . chlorhexidine  15 mL Mouth Rinse BID  . enoxaparin (LOVENOX) injection  30 mg Subcutaneous QHS  . escitalopram  10 mg Oral Daily  . feeding supplement (ENSURE ENLIVE)  237 mL Oral BID BM  . fluticasone furoate-vilanterol  1 puff Inhalation Daily  . gabapentin  100 mg Oral TID  . mouth rinse  15 mL Mouth Rinse q12n4p  . montelukast  10 mg Oral QHS  . multivitamin with minerals  1 tablet Oral Daily  . nystatin  5 mL Oral QID  . roflumilast  500 mcg Oral Daily  . umeclidinium bromide  1 puff Inhalation Daily   Continuous Infusions: PRN Meds:.acetaminophen **OR** acetaminophen, clonazePAM, ondansetron **OR** ondansetron (ZOFRAN) IV Medications Prior to Admission:  Prior to Admission medications   Medication Sig Start Date End Date Taking? Authorizing Provider  albuterol (PROVENTIL HFA) 108 (90 Base) MCG/ACT inhaler Inhale 2 puffs into the lungs every 6 (six) hours as needed for wheezing or shortness of breath. 03/26/17  Yes Regalado, Belkys A, MD  albuterol (PROVENTIL) (2.5 MG/3ML) 0.083% nebulizer solution INHALE 1 VIAL VIA NEBULIZER EVERY 6 HOURS AS NEEDED FOR WHEEZING OR FOR SHORTNESS OF BREATH 04/01/17  Yes Biagio Borg, MD  aspirin EC 81 MG tablet Take 1 tablet (81 mg total) by mouth at bedtime. 03/18/17  Yes Biagio Borg, MD  atorvastatin (LIPITOR) 20 MG tablet Take 1 tablet (20 mg total) by mouth at bedtime. 02/06/17  Yes Biagio Borg, MD  b complex vitamins tablet Take 1 tablet by mouth at bedtime. 03/18/17  Yes Biagio Borg, MD  clonazePAM (KLONOPIN) 0.5 MG tablet TAKE ONE (1) TABLET BY MOUTH TWICE DAILY AS  NEEDED FOR ANXIETY 03/26/17  Yes Biagio Borg, MD  donepezil (ARICEPT) 10 MG tablet Take 1 tablet (10 mg total) by mouth at bedtime. 03/18/17  Yes Biagio Borg, MD  escitalopram (LEXAPRO) 10 MG tablet Take 10 mg by mouth daily. 03/31/17  Yes [provider]  feeding supplement, ENSURE ENLIVE, (ENSURE ENLIVE) LIQD Take 237 mLs by mouth 2 (two) times daily between meals. 03/26/17  Yes Regalado, Belkys A, MD  fluticasone furoate-vilanterol (BREO ELLIPTA) 200-25 MCG/INH AEPB Inhale 1 puff into  the lungs daily. 04/06/17  Yes Mannam, Praveen, MD  furosemide (LASIX) 40 MG tablet Take 1 tablet (40 mg total) by mouth daily. 01/13/17  Yes Biagio Borg, MD  gabapentin (NEURONTIN) 100 MG capsule TAKE 1 CAPSULE BY MOUTH THREE TIMES DAILY 01/16/17  Yes Biagio Borg, MD  guaiFENesin (MUCINEX) 600 MG 12 hr tablet Take 1 tablet (600 mg total) by mouth 2 (two) times daily. 03/18/17  Yes Biagio Borg, MD  hydrochlorothiazide (HYDRODIURIL) 25 MG tablet Take 25 mg by mouth daily. 03/31/17  Yes [provider]  montelukast (SINGULAIR) 10 MG tablet TAKE 1 TABLET BY MOUTH EVERY NIGHT AT BEDTIME 04/01/17  Yes Biagio Borg, MD  potassium chloride (K-DUR) 10 MEQ tablet Take 1 tablet (10 mEq total) by mouth daily. 03/26/17  Yes Regalado, Belkys A, MD  Probiotic CAPS Take 1 capsule by mouth every day 03/18/17  Yes Biagio Borg, MD  roflumilast (DALIRESP) 500 MCG TABS tablet Take 1 tablet (500 mcg total) by mouth daily. 03/18/17  Yes Biagio Borg, MD  umeclidinium bromide (INCRUSE ELLIPTA) 62.5 MCG/INH AEPB Inhale 1 puff into the lungs daily. 03/03/17  Yes Mannam, Praveen, MD  nystatin (MYCOSTATIN) 100000 UNIT/ML suspension Take 5 mLs (500,000 Units total) by mouth 4 (four) times daily. 04/09/17   Regalado, Cassie Freer, MD   Allergies  Allergen Reactions  . Pravastatin Itching and Other (See Comments)    Reaction:  Muscle cramps   . Simvastatin Itching and Other (See Comments)    Reaction:  Muscle cramps   . Statins  Itching and Other (See Comments)    Reaction:  Muscle cramps   Review of Systems  Constitutional: Positive for activity change, appetite change, fatigue and unexpected weight change.  Respiratory: Positive for shortness of breath and wheezing.   Gastrointestinal: Positive for nausea.  Neurological: Positive for weakness.  Psychiatric/Behavioral: Positive for sleep disturbance.   Physical Exam  General: Alert, awake, in no acute distress.  HEENT: No bruits, no goiter, no JVD Heart: Regular rate and rhythm. No murmur appreciated. Lungs: Diminished air movement, clear Abdomen: Soft, nontender, nondistended, positive bowel sounds.  Ext: No significant edema Skin: Warm and dry Neuro: Grossly intact, nonfocal.    Vital Signs: BP 111/90 (BP Location: Right Arm)   Pulse 84   Temp 98.1 F (36.7 C) (Oral)   Resp 16   Ht 5' 1"  (1.549 m)   Wt 42.9 kg (94 lb 9.2 oz)   SpO2 100%   BMI 17.87 kg/m  Pain Assessment: No/denies pain   Pain Score: 0-No pain   SpO2: SpO2: 100 % O2 Device:SpO2: 100 %+ O2 Flow Rate: .O2 Flow Rate (L/min): 2 L/min  IO: Intake/output summary:  Intake/Output Summary (Last 24 hours) at 04/09/17 1350 Last data filed at 04/09/17 0911  Gross per 24 hour  Intake             1145 ml  Output                0 ml  Net             1145 ml    LBM:   Baseline Weight: Weight: 39.5 kg (87 lb) Most recent weight: Weight: 42.9 kg (94 lb 9.2 oz)     Palliative Assessment/Data:   Flowsheet Rows     Most Recent Value  Intake Tab  Referral Department  Hospitalist  Unit at Time of Referral  Med/Surg Unit  Date Notified  04/08/17  Palliative  Care Type  New Palliative care  Reason for referral  Clarify Goals of Care  Date of Admission  04/07/17  Date first seen by Palliative Care  04/09/17  # of days Palliative referral response time  1 Day(s)  # of days IP prior to Palliative referral  1  Clinical Assessment  Palliative Performance Scale Score  40%  Pain Max  last 24 hours  0  Pain Min Last 24 hours  0  Psychosocial & Spiritual Assessment  Palliative Care Outcomes  Patient/Family meeting held?  Yes  Who was at the meeting?  Patient, son  Palliative Care Outcomes  Clarified goals of care, Changed CPR status, Changed to focus on comfort      Time In: 3912  Time Out: 1145 Time Total: 60 Greater than 50%  of this time was spent counseling and coordinating care related to the above assessment and plan.  Signed by: Micheline Rough, MD   Please contact Palliative Medicine Team phone at [phone] for questions and concerns.  For individual provider: See Shea Evans

## 2017-04-09 NOTE — Telephone Encounter (Signed)
Informed Amber that Dewitt Hoes will be the attending.

## 2017-04-09 NOTE — Progress Notes (Signed)
Hospice and [facility]) Hospital Liaison:  RN visit   Notified by Dessa Phi, [facility], of patient/family request for [facility] services at home after discharge. Chart and patient information under review by [facility] physician. Hospice eligibility pending at this time. Writer spoke with patient and family at bedside to initiate education related to hospice philosophy, services and team approach to care.  Patient/family verbalized understanding of information given.  Per discussion, plan is for discharge to home by [place] today.  Please send signed and completed DNR form home with patient/family.  Patient will need prescriptions for discharge comfort medications.  DME needs have been discussed, patient currently has the following equipment in the home:  walker.  No additional DME needed at this time.      [facility] aware of the above.  Completed discharge summary will need to be faxed to [facility] at [phone] when final.  Please notify [facility] when patient is ready to leave the unit at discharge. (Call [phone] or [phone] after 5pm.)  [facility] information and contact numbers given to patient during this visit.  Above information shared with Dessa Phi, CMRN.  Please call with any hospice related questions. Thank you for this referral.   Farrel Gordon, RN, [place] Hospital Liaison [phone] liaisons are now on [place].

## 2017-04-09 NOTE — Care Management Note (Signed)
Case Management Note  Patient Details  Name: Michele Martin MRN: [id number] Date of Birth: Jan 03, 1946  Subjective/Objective: Palliative met w/patient-recc home hospice.CM referral for home hospice choice-patient chose [facility]. Has home 02 & travel tank-[employer],has rw. Active w/[facility]-rep notified.No current immediate dme needed @ home. pcp-Dr. Cathlean Martin. D/c home today w/family by own transp. [facility] liason-[facility] aware of referral-will eval for assessment-await outcome. Patient's sister Michele Martin main contact c# [phone].                   Action/Plan:d/c home w/home hospice   Expected Discharge Date:   (unknown)               Expected Discharge Plan:  Home w Hospice Care  In-House Referral:     Discharge planning Services  CM Consult  Post Acute Care Choice:  Resumption of Svcs/PTA Provider, Durable Medical Equipment [facility] Active, Has rw, home 02-[employer]) Choice offered to:  Patient  DME Arranged:    DME Agency:     HH Arranged:  RN [place] Agency:  Hospice and [place]  Status of Service:  Completed, signed off  If discussed at [place] of Stay Meetings, dates discussed:    Additional Comments:  Michele Phi, RN 04/09/2017, 12:02 PM

## 2017-04-10 ENCOUNTER — Other Ambulatory Visit: Payer: Self-pay

## 2017-04-10 ENCOUNTER — Other Ambulatory Visit: Payer: Self-pay | Admitting: Pharmacist

## 2017-04-10 ENCOUNTER — Telehealth: Payer: Self-pay | Admitting: *Deleted

## 2017-04-10 ENCOUNTER — Other Ambulatory Visit: Payer: Self-pay | Admitting: Pharmacy Technician

## 2017-04-10 NOTE — Patient Outreach (Signed)
Case Closure:  Placed call to patient and spoke with son and sister [place]. ( with permission from patient).  Patient is set up with hospice services and nurse is coming to see patient today.    Reviewed case closure with sister who verbalizes understanding to call hospice will all questions and needs.  Sister is very thankful for [facility] services and is very happy that patient will be able to remain her home home and that her final wishes would be carried out.  Offered listening ear and support.  PLAN: will close case as patient is active with hospice. Will send case closure letter to MD.  Tomasa Rand, RN, BSN, CEN [place] Coordinator [phone]

## 2017-04-10 NOTE — Telephone Encounter (Signed)
Pt was on TCM list admitted 04/07/12 for COPD flare-up and Falls. Pt was D/C 04/09/17, and will follow-up w/pulmonologist on 04/15/17...[name]

## 2017-04-10 NOTE — Patient Outreach (Signed)
[facility]) Care Management  04/10/2017  Michele Martin 1946/03/16 [id number]  I received a message from Marthenia Rolling, hospital liaison notifying me that this patient is being discharged to hospice. The patient's application had already been submitted to [place] for Incruse and Breo, the application for [place] for Daliresp was on hold waiting for the provider to complete. I will no longer be following this patient now that she is in hospice.  [name], [place] [phone]

## 2017-04-10 NOTE — Patient Outreach (Deleted)
[facility]) Care Management  04/10/2017  Michele Martin Oct 18, 1945 [id number]  I received a message from Marthenia Rolling, hospital liaison notifying me that this patient is being discharged to hospice. The patient's application had already been submitted to [place] for Incruse and Breo, the application for [place]&ME for Daliresp was on hold waiting for the provider to complete. I will no longer be following this patient now that she is in hospice.  [name], [place] [phone]

## 2017-04-10 NOTE — Patient Outreach (Signed)
Receive notification from [place] [facility] that patient has been discharged from the hospital to hospice care. Will close pharmacy episode at this time.  Harlow Asa, PharmD, [place] Management [phone]

## 2017-04-10 NOTE — Patient Outreach (Signed)
Care Coordination: Placed call to Hospice and Palliative Care ans spoke with Amber who states patient was admitted to services last night.  Will contact patient and close the case.  Tomasa Rand, RN, BSN, CEN [facility] [employer] [phone]

## 2017-04-15 ENCOUNTER — Inpatient Hospital Stay: Payer: Medicare Other | Admitting: Acute Care

## 2017-04-17 ENCOUNTER — Ambulatory Visit: Payer: Medicare Other | Admitting: Neurology

## 2017-04-17 ENCOUNTER — Telehealth: Payer: Self-pay

## 2017-04-17 DIAGNOSIS — J449 Chronic obstructive pulmonary disease, unspecified: Secondary | ICD-10-CM | POA: Diagnosis not present

## 2017-04-17 NOTE — Telephone Encounter (Signed)
Received Rx refill from [facility] for Dailresp. Rx has been faxed back to provided number. Received successful fax confirmation. Nothing further needed.

## 2017-04-20 ENCOUNTER — Ambulatory Visit: Payer: Medicare Other | Admitting: Cardiovascular Disease

## 2017-04-20 ENCOUNTER — Telehealth: Payer: Self-pay | Admitting: Internal Medicine

## 2017-04-20 NOTE — Telephone Encounter (Signed)
Home health nurse called in and would like [place] to call in  a script to the pharmacy for the flu shot.  Pt can not leave home to get injection.   They are also needing to know if they can get an order for something for pt leg swelling

## 2017-04-20 NOTE — Telephone Encounter (Signed)
Ok for flu shot  I would hold on the diuretic for now. thanks

## 2017-04-21 ENCOUNTER — Ambulatory Visit: Payer: Medicare Other | Admitting: Neurology

## 2017-04-24 DIAGNOSIS — J449 Chronic obstructive pulmonary disease, unspecified: Secondary | ICD-10-CM | POA: Diagnosis not present

## 2017-04-24 DIAGNOSIS — R0902 Hypoxemia: Secondary | ICD-10-CM | POA: Diagnosis not present

## 2017-04-29 ENCOUNTER — Telehealth: Payer: Self-pay

## 2017-04-29 NOTE — Telephone Encounter (Signed)
Left detailed msg for [facility] with info below.

## 2017-04-29 NOTE — Telephone Encounter (Signed)
Would an appt be needed for evaluation?

## 2017-04-29 NOTE — Telephone Encounter (Signed)
Ok with me, but may not be needed if it does not bother her; if there is pain perhaps we need to consider r/o DVT

## 2017-04-29 NOTE — Telephone Encounter (Signed)
Patient is having some swelling on left ankle/foot going on for a few weeks. Nurse would like to see if a diuretic could be called in for a few days to see it it would help.

## 2017-04-29 NOTE — Telephone Encounter (Signed)
I would not be comfortable with this, as single leg edema is often a local issue, and not related to CHF or other systemic problem

## 2017-05-17 DIAGNOSIS — J449 Chronic obstructive pulmonary disease, unspecified: Secondary | ICD-10-CM | POA: Diagnosis not present

## 2017-05-24 DIAGNOSIS — R0902 Hypoxemia: Secondary | ICD-10-CM | POA: Diagnosis not present

## 2017-05-24 DIAGNOSIS — J449 Chronic obstructive pulmonary disease, unspecified: Secondary | ICD-10-CM | POA: Diagnosis not present

## 2017-06-17 DIAGNOSIS — J449 Chronic obstructive pulmonary disease, unspecified: Secondary | ICD-10-CM | POA: Diagnosis not present

## 2017-07-17 DIAGNOSIS — J449 Chronic obstructive pulmonary disease, unspecified: Secondary | ICD-10-CM | POA: Diagnosis not present

## 2017-07-30 ENCOUNTER — Telehealth: Payer: Self-pay

## 2017-07-30 NOTE — Telephone Encounter (Signed)
PA request sent in for patients Daliresp 500 MCG. Called [employer] and spoke with Ebony. PA has been approved with part D insurance through 07/27/2018.   Reference# [id number]

## 2017-08-17 DIAGNOSIS — J449 Chronic obstructive pulmonary disease, unspecified: Secondary | ICD-10-CM | POA: Diagnosis not present

## 2017-09-30 DIAGNOSIS — E46 Unspecified protein-calorie malnutrition: Secondary | ICD-10-CM

## 2017-09-30 DIAGNOSIS — F0280 Dementia in other diseases classified elsewhere without behavioral disturbance: Secondary | ICD-10-CM

## 2017-09-30 DIAGNOSIS — J309 Allergic rhinitis, unspecified: Secondary | ICD-10-CM

## 2017-09-30 DIAGNOSIS — G9340 Encephalopathy, unspecified: Secondary | ICD-10-CM | POA: Diagnosis not present

## 2017-09-30 DIAGNOSIS — J449 Chronic obstructive pulmonary disease, unspecified: Secondary | ICD-10-CM | POA: Diagnosis not present

## 2017-09-30 DIAGNOSIS — I5030 Unspecified diastolic (congestive) heart failure: Secondary | ICD-10-CM | POA: Diagnosis not present

## 2017-09-30 DIAGNOSIS — F339 Major depressive disorder, recurrent, unspecified: Secondary | ICD-10-CM

## 2017-09-30 DIAGNOSIS — H409 Unspecified glaucoma: Secondary | ICD-10-CM

## 2017-10-05 ENCOUNTER — Ambulatory Visit: Admitting: Neurology

## 2017-11-04 ENCOUNTER — Encounter: Payer: Self-pay | Admitting: Neurology

## 2017-11-04 ENCOUNTER — Ambulatory Visit: Admitting: Neurology

## 2017-11-04 VITALS — BP 110/72 | HR 82 | Ht 61.0 in | Wt 105.0 lb

## 2017-11-04 DIAGNOSIS — F0390 Unspecified dementia without behavioral disturbance: Secondary | ICD-10-CM

## 2017-11-04 DIAGNOSIS — F03A0 Unspecified dementia, mild, without behavioral disturbance, psychotic disturbance, mood disturbance, and anxiety: Secondary | ICD-10-CM

## 2017-11-04 MED ORDER — DONEPEZIL HCL 10 MG PO TABS: 10.0000 mg | ORAL_TABLET | Freq: Every day | ORAL | 3 refills | Status: AC

## 2017-11-04 MED ORDER — MEMANTINE HCL 10 MG PO TABS: 10.0000 mg | ORAL_TABLET | Freq: Two times a day (BID) | ORAL | 6 refills | Status: AC

## 2017-11-04 NOTE — Patient Instructions (Addendum)
1. Start Namenda (Memantine) 10mg  twice a day 2. Continue Donepezil 10mg  daily  3. Follow-up in 6 months, call for any changes  FALL PRECAUTIONS: Be cautious when walking. Scan the area for obstacles that may increase the risk of trips and falls. When getting up in the mornings, sit up at the edge of the bed for a few minutes before getting out of bed. Consider elevating the bed at the head end to avoid drop of blood pressure when getting up. Walk always in a well-lit room (use night lights in the walls). Avoid area rugs or power cords from appliances in the middle of the walkways. Use a walker or a cane if necessary and consider physical therapy for balance exercise. Get your eyesight checked regularly.  FINANCIAL OVERSIGHT: Supervision, especially oversight when making financial decisions or transactions is also recommended.  HOME SAFETY: Consider the safety of the kitchen when operating appliances like stoves, microwave oven, and blender. Consider having supervision and share cooking responsibilities until no longer able to participate in those. Accidents with firearms and other hazards in the house should be identified and addressed as well.  DRIVING: Regarding driving, in patients with progressive memory problems, driving will be impaired. We advise to have someone else do the driving if trouble finding directions or if minor accidents are reported. Independent driving assessment is available to determine safety of driving.  ABILITY TO BE LEFT ALONE: If patient is unable to contact 911 operator, consider using LifeLine, or when the need is there, arrange for someone to stay with patients. Smoking is a fire hazard, consider supervision or cessation. Risk of wandering should be assessed by caregiver and if detected at any point, supervision and safe proof recommendations should be instituted.  MEDICATION SUPERVISION: Inability to self-administer medication needs to be constantly addressed. Implement  a mechanism to ensure safe administration of the medications.  RECOMMENDATIONS FOR ALL PATIENTS WITH MEMORY PROBLEMS: 1. Continue to exercise (Recommend 30 minutes of walking everyday, or 3 hours every week) 2. Increase social interactions - continue going to [place] and enjoy social gatherings with friends and family 3. Eat healthy, avoid fried foods and eat more fruits and vegetables 4. Maintain adequate blood pressure, blood sugar, and blood cholesterol level. Reducing the risk of stroke and cardiovascular disease also helps promoting better memory. 5. Avoid stressful situations. Live a simple life and avoid aggravations. Organize your time and prepare for the next day in anticipation. 6. Sleep well, avoid any interruptions of sleep and avoid any distractions in the bedroom that may interfere with adequate sleep quality 7. Avoid sugar, avoid sweets as there is a strong link between excessive sugar intake, diabetes, and cognitive impairment We discussed the Mediterranean diet, which has been shown to help patients reduce the risk of progressive memory disorders and reduces cardiovascular risk. This includes eating fish, eat fruits and green leafy vegetables, nuts like almonds and hazelnuts, walnuts, and also use olive oil. Avoid fast foods and fried foods as much as possible. Avoid sweets and sugar as sugar use has been linked to worsening of memory function.  There is always a concern of gradual progression of memory problems. If this is the case, then we may need to adjust level of care according to patient needs. Support, both to the patient and caregiver, should then be put into place.
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
## 2017-11-10 ENCOUNTER — Telehealth: Payer: Self-pay | Admitting: Internal Medicine

## 2017-11-10 NOTE — Telephone Encounter (Signed)
Copied from [place] [id number]. Topic: Quick Communication - See Telephone Encounter >> Nov 10, 2017  3:10 PM Synthia Innocent wrote: CRM for notification. See Telephone encounter for: 11/10/17. Hospice calling for verbal orders on continuing of care

## 2017-11-10 NOTE — Telephone Encounter (Signed)
Ok for verbal 

## 2017-11-11 NOTE — Telephone Encounter (Signed)
Called hospice spoke w/Jenny gave MD response.Marland KitchenJohny Martin

## 2017-11-20 NOTE — Telephone Encounter (Signed)
Pt passed away yesterday, hospice called

## 2017-11-25 DEATH — deceased

## 2017-11-26 ENCOUNTER — Telehealth: Payer: Self-pay

## 2017-11-26 ENCOUNTER — Telehealth: Payer: Self-pay | Admitting: Internal Medicine

## 2017-11-26 NOTE — Telephone Encounter (Signed)
On 11/26/17 I received a d/c from [facility]).  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 The patient is a patient of Doctor Jenny Reichmann. The d/c will be taken to [facility] for signature.  On 11/26/17 I received the d/c back from Doctor Jenny Reichmann.  I got the d/c ready and called the funeral home to let them know the d/c is ready for pickup. I also faxed a copy to the funeral home per the funeral home request.

## 2017-11-26 NOTE — Telephone Encounter (Signed)
Copied from [place] [id number]. Topic: General - Deceased Patient >> 2017-11-30 10:00 AM Synthia Innocent wrote: Reason for [id number] status of death signature, very upset it has not been signed.   Route to department's PEC Pool.

## 2017-11-26 NOTE — Telephone Encounter (Signed)
Hi Michelle, Can you please follow up on this death certificate? Thanks!  The granddaughter would like a call back - [phone].

## 2017-11-26 NOTE — Telephone Encounter (Signed)
Hello  Good Morning.. As of this morning I have not received a d/c yet.. The funeral home will need to bring one to our department on [address].  I called the granddaughter and left her a message to call me.  THANK YOU.

## 2017-12-11 IMAGING — DX DG RIBS W/ CHEST 3+V BILAT
5 series · 5 of 5 positions shown · non-contrast
Comparison: Chest radiograph February 24, 2017

CLINICAL DATA: Two week history of pain

EXAM:
BILATERAL RIBS AND CHEST - 4+ VIEW

[chest pa]
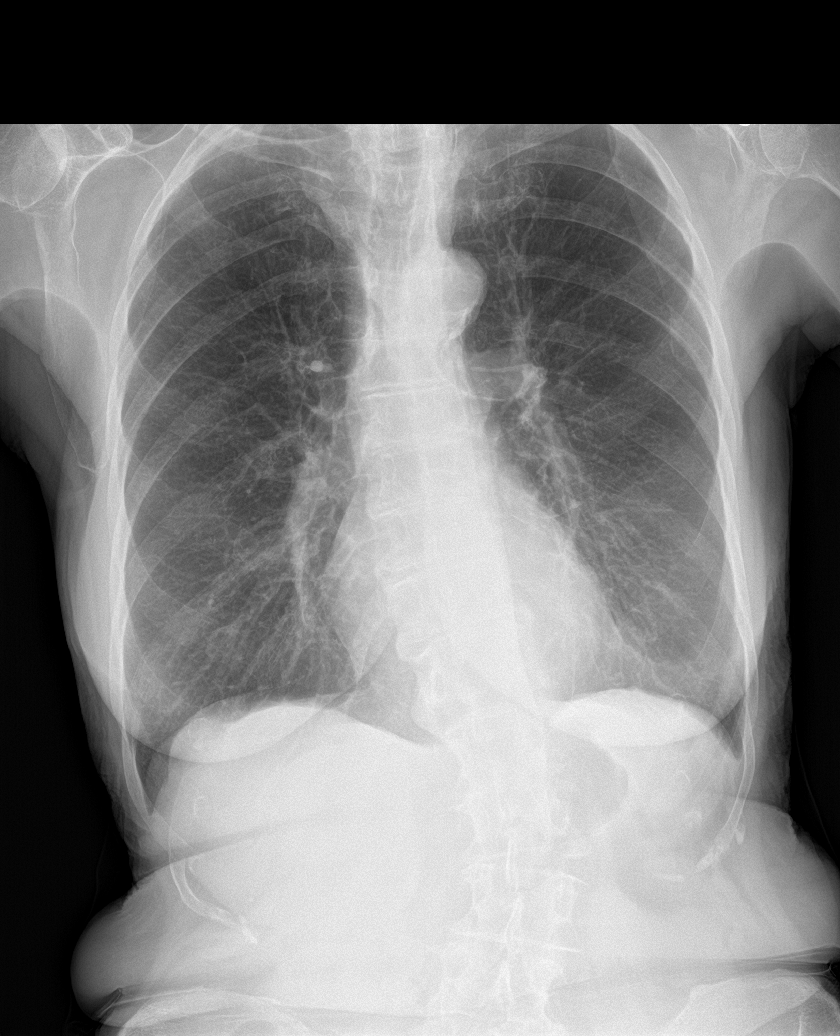

[rib pa (1 of 2)]
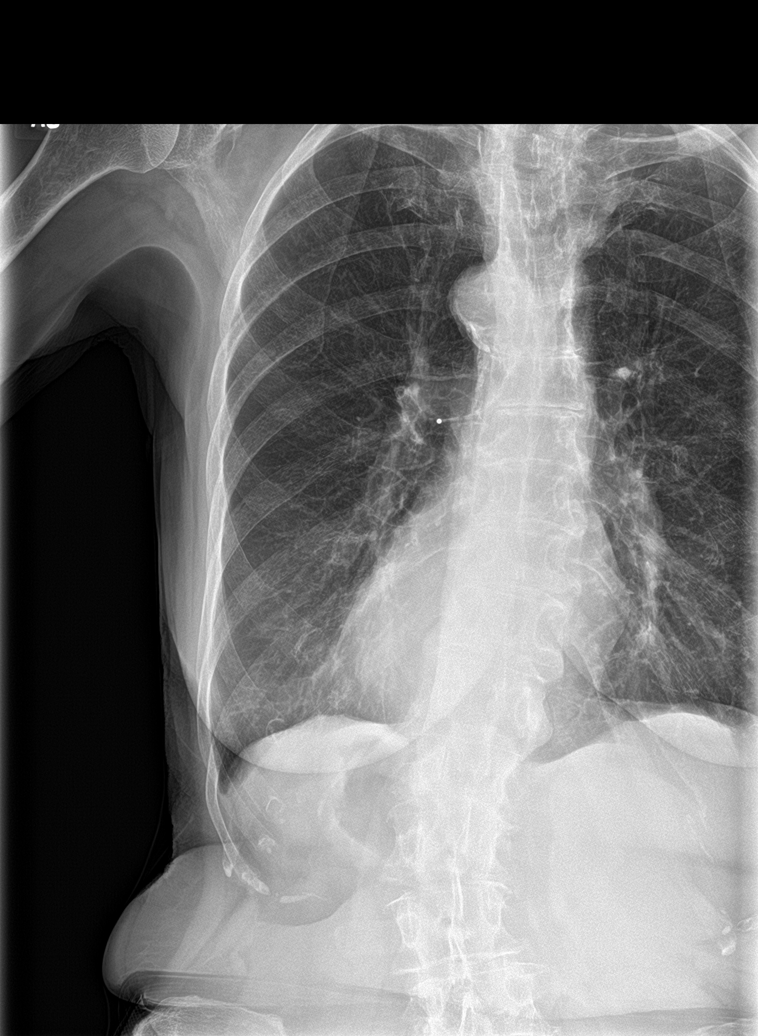

[rib pa (2 of 2)]
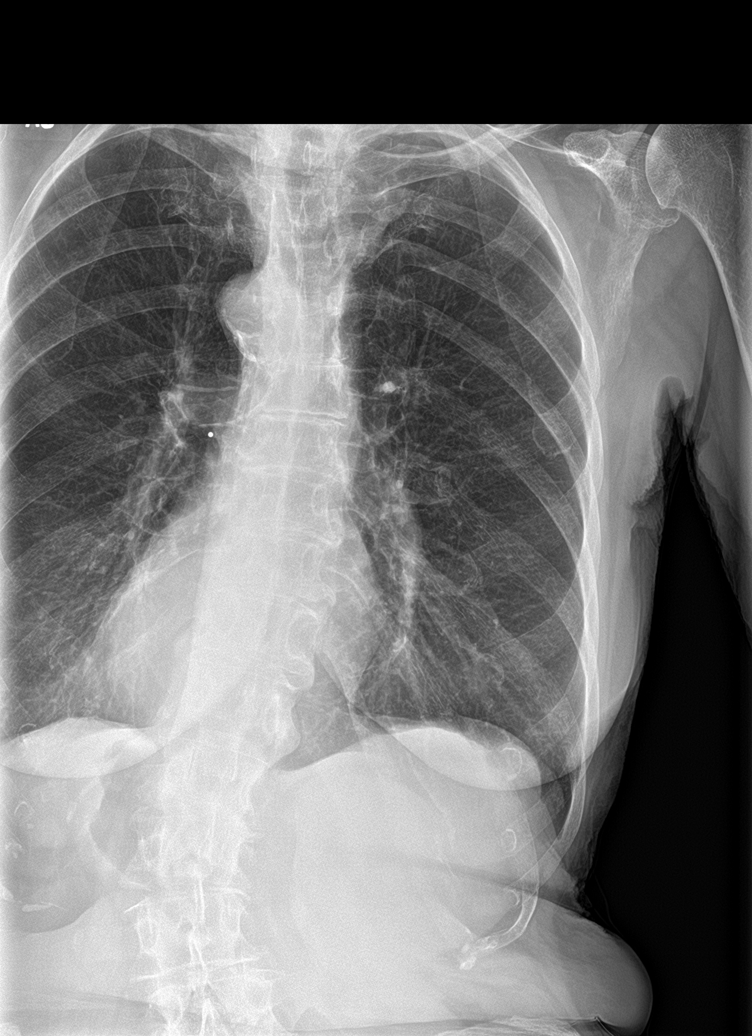

[rib obl (1 of 2)]
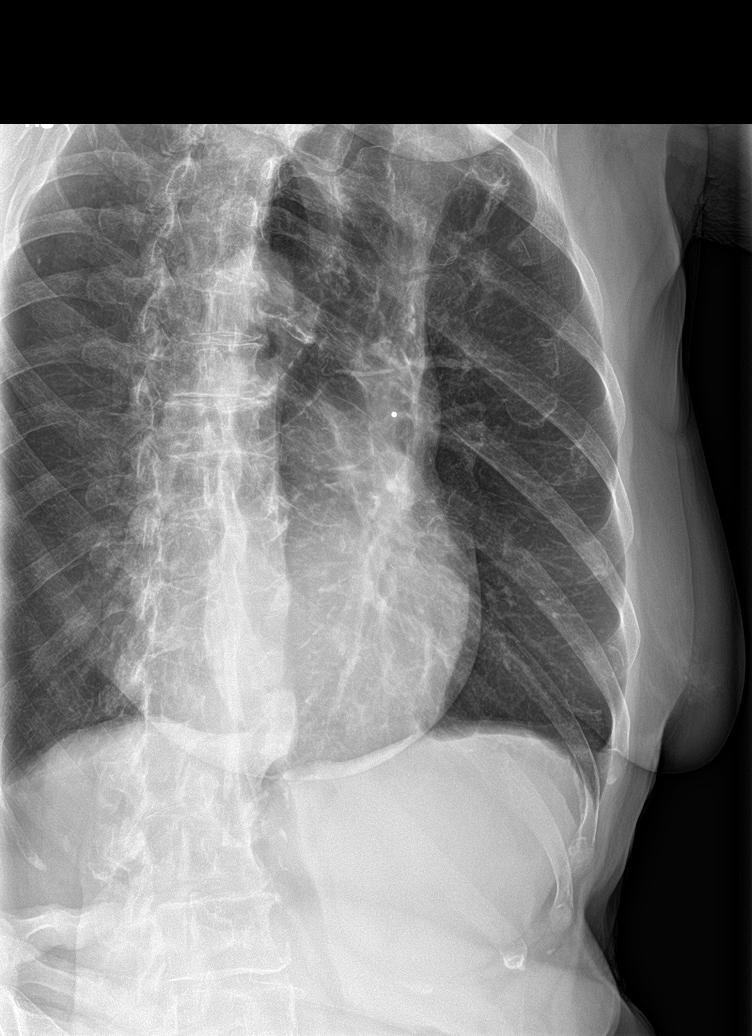

[rib obl (2 of 2)]
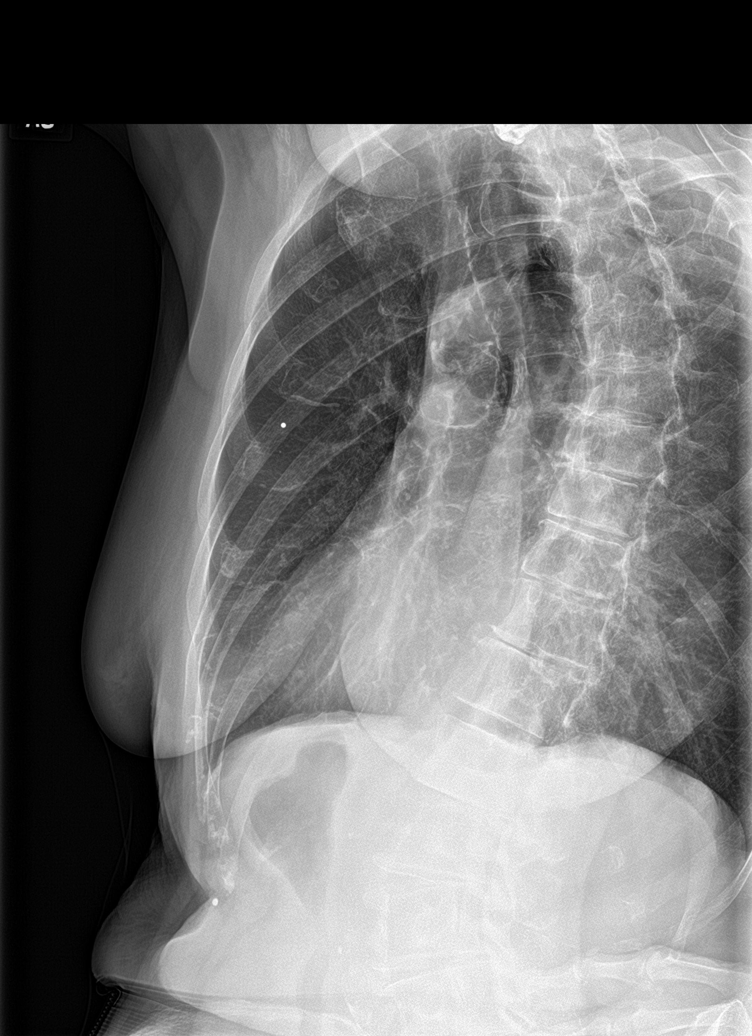

[5 of 5 positions shown; findings below may reference images not displayed]

FINDINGS: Frontal chest as well as oblique and cone-down lower rib images
obtained. There is slight left base atelectasis. Lungs elsewhere are
clear. Heart size and pulmonary vascularity are normal. No
adenopathy. There is aortic atherosclerosis.

There is no evident pneumothorax or pleural effusion. There is
thoracolumbar levoscoliosis. There is postoperative change in the
lower cervical region.

No evident rib fracture.
IMPRESSION: No evident rib fracture. Slight left base atelectasis. No edema or
consolidation. There is aortic atherosclerosis.

Aortic Atherosclerosis ([id number]).

## 2018-01-27 ENCOUNTER — Ambulatory Visit: Admitting: Neurology

## 2018-01-27 ENCOUNTER — Encounter

## 2018-04-13 ENCOUNTER — Ambulatory Visit: Admitting: Neurology

## 2018-04-13 ENCOUNTER — Encounter

## 2018-05-24 ENCOUNTER — Ambulatory Visit: Payer: Medicare Other | Admitting: Neurology
